# Patient Record
Sex: Male | Born: 1940
Health system: Southern US, Community
[De-identification: ages and names within clinical notes are randomized; demographics above are authoritative.]

## PROBLEM LIST (undated history)

## (undated) DIAGNOSIS — J189 Pneumonia, unspecified organism: Secondary | ICD-10-CM

## (undated) DIAGNOSIS — M5126 Other intervertebral disc displacement, lumbar region: Secondary | ICD-10-CM

## (undated) DIAGNOSIS — I739 Peripheral vascular disease, unspecified: Secondary | ICD-10-CM

## (undated) DIAGNOSIS — H9190 Unspecified hearing loss, unspecified ear: Secondary | ICD-10-CM

## (undated) DIAGNOSIS — I2510 Atherosclerotic heart disease of native coronary artery without angina pectoris: Secondary | ICD-10-CM

## (undated) DIAGNOSIS — R002 Palpitations: Secondary | ICD-10-CM

## (undated) DIAGNOSIS — IMO0002 Reserved for concepts with insufficient information to code with codable children: Secondary | ICD-10-CM

## (undated) DIAGNOSIS — K8689 Other specified diseases of pancreas: Secondary | ICD-10-CM

## (undated) DIAGNOSIS — S32409A Unspecified fracture of unspecified acetabulum, initial encounter for closed fracture: Secondary | ICD-10-CM

## (undated) DIAGNOSIS — N200 Calculus of kidney: Secondary | ICD-10-CM

## (undated) DIAGNOSIS — K219 Gastro-esophageal reflux disease without esophagitis: Secondary | ICD-10-CM

## (undated) DIAGNOSIS — R0989 Other specified symptoms and signs involving the circulatory and respiratory systems: Secondary | ICD-10-CM

## (undated) DIAGNOSIS — E7800 Pure hypercholesterolemia, unspecified: Secondary | ICD-10-CM

## (undated) DIAGNOSIS — E119 Type 2 diabetes mellitus without complications: Secondary | ICD-10-CM

## (undated) DIAGNOSIS — C801 Malignant (primary) neoplasm, unspecified: Secondary | ICD-10-CM

## (undated) DIAGNOSIS — I10 Essential (primary) hypertension: Secondary | ICD-10-CM

## (undated) HISTORY — PX: CORONARY ARTERY BYPASS GRAFT: SHX141

## (undated) HISTORY — PX: APPENDECTOMY: SHX54

## (undated) HISTORY — DX: Type 2 diabetes mellitus without complications: E11.9

## (undated) HISTORY — PX: EYE SURGERY: SHX253

## (undated) HISTORY — DX: Essential (primary) hypertension: I10

## (undated) HISTORY — DX: Reserved for concepts with insufficient information to code with codable children: IMO0002

## (undated) HISTORY — PX: PARS PLANA VITRECTOMY: SHX2166

## (undated) HISTORY — DX: Other specified symptoms and signs involving the circulatory and respiratory systems: R09.89

## (undated) HISTORY — DX: Pure hypercholesterolemia, unspecified: E78.00

## (undated) HISTORY — PX: OTHER SURGICAL HISTORY: SHX169

## (undated) HISTORY — PX: KNEE SURGERY: SHX244

## (undated) HISTORY — DX: Palpitations: R00.2

## (undated) HISTORY — PX: CHOLECYSTECTOMY: SHX55

## (undated) HISTORY — PX: CYSTOURETHROSCOPY: SHX476

## (undated) HISTORY — PX: BACK SURGERY: SHX140

## (undated) HISTORY — PX: NEPHROLITHOTOMY: SUR881

---
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
## 2004-12-07 ENCOUNTER — Ambulatory Visit (HOSPITAL_COMMUNITY): Admission: RE | Admit: 2004-12-07 | Discharge: 2004-12-07 | Payer: Self-pay | Admitting: Neurosurgery

## 2004-12-12 ENCOUNTER — Ambulatory Visit (HOSPITAL_COMMUNITY): Admission: RE | Admit: 2004-12-12 | Discharge: 2004-12-12 | Payer: Self-pay | Admitting: Neurosurgery

## 2004-12-23 ENCOUNTER — Ambulatory Visit (HOSPITAL_COMMUNITY): Admission: RE | Admit: 2004-12-23 | Discharge: 2004-12-23 | Payer: Self-pay | Admitting: Neurosurgery

## 2005-02-02 ENCOUNTER — Ambulatory Visit (HOSPITAL_COMMUNITY): Admission: RE | Admit: 2005-02-02 | Discharge: 2005-02-03 | Payer: Self-pay | Admitting: Ophthalmology

## 2005-10-09 ENCOUNTER — Ambulatory Visit (HOSPITAL_COMMUNITY): Admission: RE | Admit: 2005-10-09 | Discharge: 2005-10-09 | Payer: Self-pay | Admitting: Urology

## 2005-10-11 ENCOUNTER — Ambulatory Visit (HOSPITAL_COMMUNITY): Admission: AD | Admit: 2005-10-11 | Discharge: 2005-10-11 | Payer: Self-pay | Admitting: Urology

## 2005-10-23 ENCOUNTER — Ambulatory Visit (HOSPITAL_COMMUNITY): Admission: RE | Admit: 2005-10-23 | Discharge: 2005-10-23 | Payer: Self-pay | Admitting: Urology

## 2005-11-08 ENCOUNTER — Observation Stay (HOSPITAL_COMMUNITY): Admission: EM | Admit: 2005-11-08 | Discharge: 2005-11-09 | Payer: Self-pay | Admitting: General Surgery

## 2005-11-08 ENCOUNTER — Encounter (INDEPENDENT_AMBULATORY_CARE_PROVIDER_SITE_OTHER): Payer: Self-pay | Admitting: Specialist

## 2005-11-08 ENCOUNTER — Encounter (INDEPENDENT_AMBULATORY_CARE_PROVIDER_SITE_OTHER): Payer: Self-pay | Admitting: *Deleted

## 2005-12-22 ENCOUNTER — Ambulatory Visit: Payer: Self-pay | Admitting: Cardiovascular Disease

## 2006-01-02 ENCOUNTER — Ambulatory Visit: Payer: Self-pay

## 2006-01-03 ENCOUNTER — Ambulatory Visit: Payer: Self-pay | Admitting: Internal Medicine

## 2006-01-15 ENCOUNTER — Ambulatory Visit: Payer: Self-pay | Admitting: Internal Medicine

## 2006-09-20 ENCOUNTER — Ambulatory Visit (HOSPITAL_COMMUNITY): Admission: RE | Admit: 2006-09-20 | Discharge: 2006-09-20 | Payer: Self-pay | Admitting: Neurosurgery

## 2007-01-30 ENCOUNTER — Ambulatory Visit: Payer: Self-pay | Admitting: Cardiovascular Disease

## 2007-02-21 ENCOUNTER — Ambulatory Visit: Payer: Self-pay

## 2007-05-09 ENCOUNTER — Ambulatory Visit: Payer: Self-pay | Admitting: Cardiovascular Disease

## 2007-05-13 ENCOUNTER — Inpatient Hospital Stay (HOSPITAL_COMMUNITY): Admission: AD | Admit: 2007-05-13 | Discharge: 2007-05-19 | Payer: Self-pay | Admitting: Cardiovascular Disease

## 2007-05-13 ENCOUNTER — Encounter: Payer: Self-pay | Admitting: Thoracic Surgery (Cardiothoracic Vascular Surgery)

## 2007-05-13 ENCOUNTER — Ambulatory Visit: Payer: Self-pay | Admitting: Cardiovascular Disease

## 2007-05-13 ENCOUNTER — Ambulatory Visit: Payer: Self-pay | Admitting: Thoracic Surgery (Cardiothoracic Vascular Surgery)

## 2007-05-13 ENCOUNTER — Encounter: Payer: Self-pay | Admitting: Cardiovascular Disease

## 2007-05-13 ENCOUNTER — Inpatient Hospital Stay (HOSPITAL_BASED_OUTPATIENT_CLINIC_OR_DEPARTMENT_OTHER): Admission: RE | Admit: 2007-05-13 | Discharge: 2007-05-13 | Payer: Self-pay | Admitting: Cardiovascular Disease

## 2007-05-29 ENCOUNTER — Encounter
Admission: RE | Admit: 2007-05-29 | Discharge: 2007-05-29 | Payer: Self-pay | Admitting: Thoracic Surgery (Cardiothoracic Vascular Surgery)

## 2007-06-03 ENCOUNTER — Ambulatory Visit: Payer: Self-pay | Admitting: Thoracic Surgery (Cardiothoracic Vascular Surgery)

## 2007-06-10 ENCOUNTER — Ambulatory Visit: Payer: Self-pay | Admitting: Cardiovascular Disease

## 2007-07-18 ENCOUNTER — Ambulatory Visit: Payer: Self-pay

## 2007-07-24 ENCOUNTER — Ambulatory Visit (HOSPITAL_COMMUNITY): Admission: RE | Admit: 2007-07-24 | Discharge: 2007-07-24 | Payer: Self-pay | Admitting: Cardiovascular Disease

## 2007-07-24 ENCOUNTER — Ambulatory Visit: Payer: Self-pay | Admitting: Cardiovascular Disease

## 2007-07-24 LAB — CONVERTED CEMR LAB
BUN: 17 mg/dL (ref 6–23)
Basophils Absolute: 0 10*3/uL (ref 0.0–0.1)
Basophils Relative: 0 % (ref 0.0–1.0)
CO2: 30 meq/L (ref 19–32)
Calcium: 9.7 mg/dL (ref 8.4–10.5)
Chloride: 105 meq/L (ref 96–112)
Creatinine, Ser: 0.9 mg/dL (ref 0.4–1.5)
Eosinophils Absolute: 0.3 10*3/uL (ref 0.0–0.7)
Eosinophils Relative: 4.7 % (ref 0.0–5.0)
GFR calc Af Amer: 109 mL/min
GFR calc non Af Amer: 90 mL/min
Glucose, Bld: 185 mg/dL — ABNORMAL HIGH (ref 70–99)
HCT: 40.8 % (ref 39.0–52.0)
Hemoglobin: 14.2 g/dL (ref 13.0–17.0)
Lymphocytes Relative: 22.4 % (ref 12.0–46.0)
MCHC: 34.9 g/dL (ref 30.0–36.0)
MCV: 91.5 fL (ref 78.0–100.0)
Monocytes Absolute: 0.4 10*3/uL (ref 0.1–1.0)
Monocytes Relative: 6.3 % (ref 3.0–12.0)
Neutro Abs: 4.2 10*3/uL (ref 1.4–7.7)
Neutrophils Relative %: 66.6 % (ref 43.0–77.0)
Platelets: 189 10*3/uL (ref 150–400)
Potassium: 4.5 meq/L (ref 3.5–5.1)
RBC: 4.46 M/uL (ref 4.22–5.81)
RDW: 12.5 % (ref 11.5–14.6)
Sodium: 141 meq/L (ref 135–145)
WBC: 6.3 10*3/uL (ref 4.5–10.5)

## 2007-09-09 ENCOUNTER — Ambulatory Visit: Payer: Self-pay | Admitting: Cardiovascular Disease

## 2007-11-22 ENCOUNTER — Encounter (INDEPENDENT_AMBULATORY_CARE_PROVIDER_SITE_OTHER): Payer: Self-pay | Admitting: *Deleted

## 2007-11-22 ENCOUNTER — Ambulatory Visit (HOSPITAL_COMMUNITY): Admission: RE | Admit: 2007-11-22 | Discharge: 2007-11-23 | Payer: Self-pay | Admitting: Urology

## 2007-11-25 ENCOUNTER — Emergency Department (HOSPITAL_COMMUNITY): Admission: EM | Admit: 2007-11-25 | Discharge: 2007-11-25 | Payer: Self-pay | Admitting: Emergency Medicine

## 2007-12-03 ENCOUNTER — Emergency Department (HOSPITAL_COMMUNITY): Admission: EM | Admit: 2007-12-03 | Discharge: 2007-12-03 | Payer: Self-pay | Admitting: Emergency Medicine

## 2007-12-26 ENCOUNTER — Ambulatory Visit: Payer: Self-pay | Admitting: Cardiovascular Disease

## 2007-12-31 ENCOUNTER — Encounter: Admission: RE | Admit: 2007-12-31 | Discharge: 2007-12-31 | Payer: Self-pay | Admitting: Specialist

## 2008-02-20 ENCOUNTER — Inpatient Hospital Stay (HOSPITAL_COMMUNITY): Admission: RE | Admit: 2008-02-20 | Discharge: 2008-02-23 | Payer: Self-pay | Admitting: Specialist

## 2008-05-14 HISTORY — PX: OTHER SURGICAL HISTORY: SHX169

## 2008-06-10 ENCOUNTER — Telehealth: Payer: Self-pay | Admitting: Cardiovascular Disease

## 2008-06-16 ENCOUNTER — Ambulatory Visit (HOSPITAL_COMMUNITY): Admission: RE | Admit: 2008-06-16 | Discharge: 2008-06-17 | Payer: Self-pay | Admitting: Specialist

## 2008-10-22 ENCOUNTER — Encounter: Payer: Self-pay | Admitting: Cardiovascular Disease

## 2008-10-22 ENCOUNTER — Ambulatory Visit: Payer: Self-pay | Admitting: Cardiovascular Disease

## 2008-10-23 ENCOUNTER — Encounter: Admission: RE | Admit: 2008-10-23 | Discharge: 2008-10-23 | Payer: Self-pay | Admitting: Neurosurgery

## 2008-12-01 ENCOUNTER — Ambulatory Visit (HOSPITAL_COMMUNITY): Admission: RE | Admit: 2008-12-01 | Discharge: 2008-12-01 | Payer: Self-pay | Admitting: Neurosurgery

## 2008-12-15 ENCOUNTER — Telehealth: Payer: Self-pay | Admitting: Cardiovascular Disease

## 2008-12-16 DIAGNOSIS — E119 Type 2 diabetes mellitus without complications: Secondary | ICD-10-CM | POA: Insufficient documentation

## 2008-12-16 DIAGNOSIS — E1159 Type 2 diabetes mellitus with other circulatory complications: Secondary | ICD-10-CM | POA: Insufficient documentation

## 2008-12-16 DIAGNOSIS — M549 Dorsalgia, unspecified: Secondary | ICD-10-CM | POA: Insufficient documentation

## 2008-12-16 DIAGNOSIS — I10 Essential (primary) hypertension: Secondary | ICD-10-CM | POA: Insufficient documentation

## 2008-12-16 DIAGNOSIS — R002 Palpitations: Secondary | ICD-10-CM | POA: Insufficient documentation

## 2008-12-17 ENCOUNTER — Ambulatory Visit: Payer: Self-pay | Admitting: Cardiovascular Disease

## 2008-12-17 LAB — CONVERTED CEMR LAB
BUN: 18 mg/dL (ref 6–23)
Basophils Absolute: 0.1 10*3/uL (ref 0.0–0.1)
Basophils Relative: 0.9 % (ref 0.0–3.0)
CO2: 30 meq/L (ref 19–32)
Calcium: 9.6 mg/dL (ref 8.4–10.5)
Chloride: 101 meq/L (ref 96–112)
Creatinine, Ser: 0.9 mg/dL (ref 0.4–1.5)
Eosinophils Absolute: 0.3 10*3/uL (ref 0.0–0.7)
Eosinophils Relative: 3.7 % (ref 0.0–5.0)
GFR calc non Af Amer: 89.4 mL/min (ref >60)
Glucose, Bld: 173 mg/dL — ABNORMAL HIGH (ref 70–99)
HCT: 42.4 % (ref 39.0–52.0)
Hemoglobin: 14.2 g/dL (ref 13.0–17.0)
INR: 1 (ref 0.8–1.0)
Lymphocytes Relative: 22 % (ref 12.0–46.0)
Lymphs Abs: 1.8 10*3/uL (ref 0.7–4.0)
MCHC: 33.6 g/dL (ref 30.0–36.0)
MCV: 95.4 fL (ref 78.0–100.0)
Monocytes Absolute: 0.8 10*3/uL (ref 0.1–1.0)
Monocytes Relative: 9.4 % (ref 3.0–12.0)
Neutro Abs: 5.1 10*3/uL (ref 1.4–7.7)
Neutrophils Relative %: 64 % (ref 43.0–77.0)
Platelets: 240 10*3/uL (ref 150.0–400.0)
Potassium: 4.3 meq/L (ref 3.5–5.1)
Prothrombin Time: 10.6 s (ref 9.1–11.7)
RBC: 4.44 M/uL (ref 4.22–5.81)
RDW: 12 % (ref 11.5–14.6)
Sodium: 140 meq/L (ref 135–145)
WBC: 8.1 10*3/uL (ref 4.5–10.5)
aPTT: 27.6 s (ref 21.7–28.8)

## 2008-12-24 ENCOUNTER — Ambulatory Visit: Payer: Self-pay | Admitting: Cardiovascular Disease

## 2008-12-24 ENCOUNTER — Inpatient Hospital Stay (HOSPITAL_BASED_OUTPATIENT_CLINIC_OR_DEPARTMENT_OTHER): Admission: RE | Admit: 2008-12-24 | Discharge: 2008-12-24 | Payer: Self-pay | Admitting: Cardiovascular Disease

## 2009-01-22 ENCOUNTER — Encounter: Admission: RE | Admit: 2009-01-22 | Discharge: 2009-01-22 | Payer: Self-pay | Admitting: Neurosurgery

## 2009-01-28 ENCOUNTER — Encounter: Payer: Self-pay | Admitting: Cardiovascular Disease

## 2009-02-26 ENCOUNTER — Ambulatory Visit (HOSPITAL_COMMUNITY): Admission: RE | Admit: 2009-02-26 | Discharge: 2009-02-26 | Payer: Self-pay | Admitting: Neurosurgery

## 2009-05-10 ENCOUNTER — Telehealth: Payer: Self-pay | Admitting: Internal Medicine

## 2009-05-10 DIAGNOSIS — K5900 Constipation, unspecified: Secondary | ICD-10-CM | POA: Insufficient documentation

## 2009-05-11 ENCOUNTER — Ambulatory Visit: Payer: Self-pay | Admitting: Internal Medicine

## 2009-05-18 ENCOUNTER — Telehealth: Payer: Self-pay | Admitting: Internal Medicine

## 2009-05-26 ENCOUNTER — Telehealth: Payer: Self-pay | Admitting: Internal Medicine

## 2009-05-27 ENCOUNTER — Ambulatory Visit: Payer: Self-pay | Admitting: Internal Medicine

## 2009-05-27 DIAGNOSIS — E785 Hyperlipidemia, unspecified: Secondary | ICD-10-CM | POA: Insufficient documentation

## 2009-05-27 DIAGNOSIS — R1084 Generalized abdominal pain: Secondary | ICD-10-CM | POA: Insufficient documentation

## 2009-05-28 ENCOUNTER — Telehealth: Payer: Self-pay | Admitting: Physician Assistant

## 2009-05-28 ENCOUNTER — Encounter: Payer: Self-pay | Admitting: Physician Assistant

## 2009-05-28 ENCOUNTER — Encounter: Payer: Self-pay | Admitting: Internal Medicine

## 2009-05-28 DIAGNOSIS — R110 Nausea: Secondary | ICD-10-CM | POA: Insufficient documentation

## 2009-05-28 DIAGNOSIS — R198 Other specified symptoms and signs involving the digestive system and abdomen: Secondary | ICD-10-CM | POA: Insufficient documentation

## 2009-06-01 LAB — CONVERTED CEMR LAB
ALT: 15 units/L (ref 0–53)
AST: 19 units/L (ref 0–37)
Albumin: 4.3 g/dL (ref 3.5–5.2)
Alkaline Phosphatase: 59 units/L (ref 39–117)
BUN: 19 mg/dL (ref 6–23)
Basophils Absolute: 0 10*3/uL (ref 0.0–0.1)
Basophils Relative: 0.5 % (ref 0.0–3.0)
Bilirubin Urine: NEGATIVE
CO2: 30 meq/L (ref 19–32)
Calcium: 9.8 mg/dL (ref 8.4–10.5)
Chloride: 100 meq/L (ref 96–112)
Creatinine, Ser: 1 mg/dL (ref 0.4–1.5)
Eosinophils Absolute: 0.1 10*3/uL (ref 0.0–0.7)
Eosinophils Relative: 1.9 % (ref 0.0–5.0)
GFR calc non Af Amer: 79.06 mL/min (ref >60)
Glucose, Bld: 169 mg/dL — ABNORMAL HIGH (ref 70–99)
HCT: 41.7 % (ref 39.0–52.0)
Hemoglobin, Urine: NEGATIVE
Hemoglobin: 14.5 g/dL (ref 13.0–17.0)
Ketones, ur: NEGATIVE mg/dL
Leukocytes, UA: NEGATIVE
Lipase: 33 units/L (ref 11.0–59.0)
Lymphocytes Relative: 24 % (ref 12.0–46.0)
Lymphs Abs: 1.8 10*3/uL (ref 0.7–4.0)
MCHC: 34.7 g/dL (ref 30.0–36.0)
MCV: 93 fL (ref 78.0–100.0)
Monocytes Absolute: 0.7 10*3/uL (ref 0.1–1.0)
Monocytes Relative: 9.9 % (ref 3.0–12.0)
Neutro Abs: 4.7 10*3/uL (ref 1.4–7.7)
Neutrophils Relative %: 63.7 % (ref 43.0–77.0)
Nitrite: NEGATIVE
Platelets: 221 10*3/uL (ref 150.0–400.0)
Potassium: 4.4 meq/L (ref 3.5–5.1)
RBC: 4.49 M/uL (ref 4.22–5.81)
RDW: 13 % (ref 11.5–14.6)
Sodium: 140 meq/L (ref 135–145)
Specific Gravity, Urine: 1.015 (ref 1.000–1.030)
Total Bilirubin: 0.5 mg/dL (ref 0.3–1.2)
Total Protein, Urine: NEGATIVE mg/dL
Total Protein: 7.8 g/dL (ref 6.0–8.3)
Urine Glucose: NEGATIVE mg/dL
Urobilinogen, UA: 0.2 (ref 0.0–1.0)
WBC: 7.4 10*3/uL (ref 4.5–10.5)
pH: 6 (ref 5.0–8.0)

## 2009-06-02 ENCOUNTER — Ambulatory Visit (HOSPITAL_COMMUNITY): Admission: RE | Admit: 2009-06-02 | Discharge: 2009-06-02 | Payer: Self-pay | Admitting: Internal Medicine

## 2009-06-02 DIAGNOSIS — C7A8 Other malignant neuroendocrine tumors: Secondary | ICD-10-CM | POA: Insufficient documentation

## 2009-06-07 ENCOUNTER — Telehealth: Payer: Self-pay | Admitting: Internal Medicine

## 2009-06-09 ENCOUNTER — Encounter (INDEPENDENT_AMBULATORY_CARE_PROVIDER_SITE_OTHER): Payer: Self-pay | Admitting: *Deleted

## 2009-06-09 ENCOUNTER — Ambulatory Visit: Payer: Self-pay | Admitting: Internal Medicine

## 2009-06-09 ENCOUNTER — Telehealth (INDEPENDENT_AMBULATORY_CARE_PROVIDER_SITE_OTHER): Payer: Self-pay | Admitting: *Deleted

## 2009-06-09 ENCOUNTER — Ambulatory Visit (HOSPITAL_COMMUNITY): Admission: RE | Admit: 2009-06-09 | Discharge: 2009-06-09 | Payer: Self-pay | Admitting: Internal Medicine

## 2009-06-09 DIAGNOSIS — R933 Abnormal findings on diagnostic imaging of other parts of digestive tract: Secondary | ICD-10-CM | POA: Insufficient documentation

## 2009-06-09 LAB — CONVERTED CEMR LAB
Amylase: 113 units/L (ref 27–131)
Lipase: 39 units/L (ref 11.0–59.0)

## 2009-06-10 ENCOUNTER — Encounter (INDEPENDENT_AMBULATORY_CARE_PROVIDER_SITE_OTHER): Payer: Self-pay | Admitting: *Deleted

## 2009-06-10 ENCOUNTER — Telehealth: Payer: Self-pay | Admitting: Internal Medicine

## 2009-06-11 ENCOUNTER — Ambulatory Visit: Payer: Self-pay | Admitting: Internal Medicine

## 2009-06-11 ENCOUNTER — Encounter: Payer: Self-pay | Admitting: Internal Medicine

## 2009-06-11 ENCOUNTER — Encounter (INDEPENDENT_AMBULATORY_CARE_PROVIDER_SITE_OTHER): Payer: Self-pay | Admitting: *Deleted

## 2009-06-14 ENCOUNTER — Ambulatory Visit: Payer: Self-pay | Admitting: Internal Medicine

## 2009-06-15 ENCOUNTER — Encounter: Payer: Self-pay | Admitting: Internal Medicine

## 2009-06-15 ENCOUNTER — Telehealth: Payer: Self-pay | Admitting: Internal Medicine

## 2009-06-15 ENCOUNTER — Ambulatory Visit: Payer: Self-pay | Admitting: Internal Medicine

## 2009-06-15 LAB — CONVERTED CEMR LAB
Basophils Absolute: 0 10*3/uL (ref 0.0–0.1)
Basophils Relative: 0.5 % (ref 0.0–3.0)
Eosinophils Absolute: 0.1 10*3/uL (ref 0.0–0.7)
Eosinophils Relative: 1.7 % (ref 0.0–5.0)
HCT: 42 % (ref 39.0–52.0)
Hemoglobin: 14.9 g/dL (ref 13.0–17.0)
Lymphocytes Relative: 22.8 % (ref 12.0–46.0)
Lymphs Abs: 1.8 10*3/uL (ref 0.7–4.0)
MCHC: 35.4 g/dL (ref 30.0–36.0)
MCV: 92.1 fL (ref 78.0–100.0)
Monocytes Absolute: 0.6 10*3/uL (ref 0.1–1.0)
Monocytes Relative: 8 % (ref 3.0–12.0)
Neutro Abs: 5.2 10*3/uL (ref 1.4–7.7)
Neutrophils Relative %: 67 % (ref 43.0–77.0)
Platelets: 217 10*3/uL (ref 150.0–400.0)
RBC: 4.56 M/uL (ref 4.22–5.81)
RDW: 12.8 % (ref 11.5–14.6)
WBC: 7.8 10*3/uL (ref 4.5–10.5)

## 2009-06-16 ENCOUNTER — Telehealth: Payer: Self-pay | Admitting: Internal Medicine

## 2009-06-17 ENCOUNTER — Telehealth: Payer: Self-pay | Admitting: Internal Medicine

## 2009-06-18 ENCOUNTER — Ambulatory Visit: Payer: Self-pay | Admitting: Gastroenterology

## 2009-06-18 ENCOUNTER — Emergency Department (HOSPITAL_COMMUNITY): Admission: EM | Admit: 2009-06-18 | Discharge: 2009-06-18 | Payer: Self-pay | Admitting: Emergency Medicine

## 2009-06-18 ENCOUNTER — Telehealth: Payer: Self-pay | Admitting: Gastroenterology

## 2009-06-18 LAB — CONVERTED CEMR LAB: 5-HIAA, 24 Hr Urine: 11.4 mg/(24.h) — ABNORMAL HIGH (ref <=6.0)

## 2009-06-21 ENCOUNTER — Encounter: Payer: Self-pay | Admitting: Gastroenterology

## 2009-06-21 ENCOUNTER — Ambulatory Visit (HOSPITAL_COMMUNITY): Admission: RE | Admit: 2009-06-21 | Discharge: 2009-06-21 | Payer: Self-pay | Admitting: Gastroenterology

## 2009-06-23 ENCOUNTER — Encounter: Payer: Self-pay | Admitting: Gastroenterology

## 2009-06-23 ENCOUNTER — Telehealth: Payer: Self-pay | Admitting: Gastroenterology

## 2009-06-24 ENCOUNTER — Telehealth (INDEPENDENT_AMBULATORY_CARE_PROVIDER_SITE_OTHER): Payer: Self-pay | Admitting: *Deleted

## 2009-06-24 ENCOUNTER — Telehealth: Payer: Self-pay | Admitting: Internal Medicine

## 2009-06-25 ENCOUNTER — Encounter: Payer: Self-pay | Admitting: Internal Medicine

## 2009-06-26 ENCOUNTER — Encounter: Payer: Self-pay | Admitting: Internal Medicine

## 2009-07-02 ENCOUNTER — Encounter: Payer: Self-pay | Admitting: Internal Medicine

## 2009-07-03 ENCOUNTER — Encounter: Payer: Self-pay | Admitting: Internal Medicine

## 2009-07-12 ENCOUNTER — Encounter: Payer: Self-pay | Admitting: Internal Medicine

## 2009-07-15 ENCOUNTER — Encounter: Payer: Self-pay | Admitting: Internal Medicine

## 2009-07-20 ENCOUNTER — Telehealth: Payer: Self-pay | Admitting: Internal Medicine

## 2009-07-20 ENCOUNTER — Encounter: Payer: Self-pay | Admitting: Internal Medicine

## 2009-07-21 ENCOUNTER — Encounter: Payer: Self-pay | Admitting: Internal Medicine

## 2009-07-26 ENCOUNTER — Telehealth: Payer: Self-pay | Admitting: Internal Medicine

## 2009-07-27 ENCOUNTER — Encounter: Payer: Self-pay | Admitting: Internal Medicine

## 2009-08-10 ENCOUNTER — Telehealth: Payer: Self-pay | Admitting: Internal Medicine

## 2009-08-19 ENCOUNTER — Telehealth (INDEPENDENT_AMBULATORY_CARE_PROVIDER_SITE_OTHER): Payer: Self-pay | Admitting: *Deleted

## 2010-03-06 ENCOUNTER — Encounter: Payer: Self-pay | Admitting: Cardiothoracic Surgery

## 2010-03-06 ENCOUNTER — Encounter: Payer: Self-pay | Admitting: Internal Medicine

## 2010-03-17 NOTE — Progress Notes (Signed)
Summary: f/u call  Phone Note Call from Patient Call back at Home Phone [phone]   Caller: Patient Call For: Mike Gip Reason for Call: Talk to Nurse Summary of Call: following up on scheduling testing for "stomach burning"... discussed at yesterday's appt per pt Initial call taken by: Vallarie Mare,  May 28, 2009 8:05 AM  Follow-up for Phone Call        Spoke to Ricardo Le and he is the same, no worse.  He said he did have a large BM this AM and it was diarrhea.  I advised him to not do the Miralax today.  I told him I will speak to Amy and we will call him back if she is going to order a CT or any other testing.   Follow-up by: Joselyn Glassman,  May 28, 2009 8:20 AM  Additional Follow-up for Phone Call Additional follow up Details #1::        LET'S GO AHEAD WITH ABDOMINAL /PELVIC CT WITH CONTRAST. COULD BE DONE ON MONDAY-THEN HE NEEDS FOLLOW UP WITH DR. Lynnell Catalan. Additional Follow-up by: Sammuel Cooper PA-c,  May 28, 2009 11:40 AM     Appended Document: f/u call Ricardo Le said this afternoon he is hurting some and the burning is the same as yesterday.  He is having some bladder burning and Amy suggested he call his PCP if it is bladder problems.  She did say we will fax a Vicodin RX for pain for him.  I urged him to go to the ER if his situation worsens.

## 2010-03-17 NOTE — Progress Notes (Signed)
Summary: TRIAGE  Phone Note Call from Patient Call back at Home Phone [phone]   Caller: spouse Ricardo Le Call For: Ricardo Le Reason for Call: Talk to Nurse Summary of Call: Wife called and wants to let Dr Juanda Chance and Dr Christella Hartigan know that her husband is in [facility]. and had the tumor removed and now he has a lot of fluid around the pancreas and they are unable to take it out. Wife wants to know if Dr Juanda Chance can continue care for her husband. Initial call taken by: Tawni Levy,  July 20, 2009 4:48 PM  Follow-up for Phone Call        DR.Alam Guterrez PLEASE ADVISE  Follow-up by: Laureen Ochs LPN,  July 21, 4130 4:57 PM  Additional Follow-up for Phone Call Additional follow up Details #1::        He ought to finish his treatment at [facility]., and let his doctors send the discharge summary before I would continue his follow up Additional Follow-up by: Hart Carwin MD,  July 20, 2009 8:06 PM    Additional Follow-up for Phone Call Additional follow up Details #2::    Above MD orders reviewed with patient's wife. Pt. instructed to call back as needed.  Follow-up by: Laureen Ochs LPN,  July 22, 4399 8:11 AM

## 2010-03-17 NOTE — Assessment & Plan Note (Signed)
Summary: CP (786.59)/MMR  Nurse Visit   Vital Signs:  Patient profile:   70 year old male Pulse rate:   83 / minute Pulse rhythm:   regular BP sitting:   141 / 78  (left arm)  Vitals Entered By: Duncan Dull, RN, BSN (October 22, 2008 1:10 PM)  Visit Type:  Walk in Primary Provider:  Dr. Shelah Lewandowsky  CC:  chest pain.  History of Present Illness: Pt walked in c/o CP described as tooth ache, no stabbing, no sharpness, no nausea, no vomiting, no radiation, appeared to have a few episodes of SOB  while in my presence although pt. denies SOB. No cough, no pain with deep breaths. Pain is relieved when he lies down and at night while sleeping. EKG appeared normal, reviewed by Dr. Clifton James (DOD). He has chronic upper back pain which he is scheduled for a mylogram tomorrow. Dr. Clifton James did not have time to see him and recommended he go to the ER for a more thorough work up. Pt refused and wants to wait until Dr. Eden Emms is back in the office tomorrow. He did express understanding and agree to go the ER if his pain got worse. He wife was with him and also expressed understanding.   Appended Document: CP (786.59)/MMR spoke with Dr Eden Emms if pt needs to be seen will need to be aded to DOD today if not Dr Eden Emms will be glad to see him next week.  Called and spoke with pt he is to have the Myelogram at 12:30 today.  He really feels like all the pain is coming from his back and just stopped by yesterday to make sure.  He is not in as much pain today as he was yesterday.  He will call back on Monday morning if his pain is no better and he needs to be seen.  Appended Document: CP (786.59)/MMR spoke with pt, he is feeling better today, he had his test on friday and has an appt to follow up that testing on friday next week. he has had no further pain since then. he will call if problems develop.

## 2010-03-17 NOTE — Miscellaneous (Signed)
Summary: Orders Update  Clinical Lists Changes  Orders: Added new Test order of T-2 View CXR (71020TC) - Signed 

## 2010-03-17 NOTE — Miscellaneous (Signed)
  Clinical Lists Changes  Medications: Changed medication from ACIPHEX 20 MG TBEC (RABEPRAZOLE SODIUM) Take 1 tab 30 min before breakfast until gone to ACIPHEX 20 MG TBEC (RABEPRAZOLE SODIUM) Take 1 tab 30 min before breakfast and dinner meals - Signed Rx of ACIPHEX 20 MG TBEC (RABEPRAZOLE SODIUM) Take 1 tab 30 min before breakfast and dinner meals;  #60 x 3;  Signed;  Entered by: Rachael Fee MD;  Authorized by: Rachael Fee MD;  Method used: Electronically to [facility] [address]*, [address] [place], [place]  [address], Ph: [phone], Fax: [phone]    Prescriptions: ACIPHEX 20 MG TBEC (RABEPRAZOLE SODIUM) Take 1 tab 30 min before breakfast and dinner meals  #60 x 3   Entered and Authorized by:   Rachael Fee MD   Signed by:   Rachael Fee MD on 06/23/2009   Method used:   Electronically to        [facility] [id number] (retail)       [address]       [place], [place]  [address]       Ph: [phone]       Fax: [phone]   RxID:   [id number]

## 2010-03-17 NOTE — Procedures (Signed)
Summary: Upper Endoscopy  Patient: Ricardo Le Note: All result statuses are Final unless otherwise noted.  Tests: (1) Upper Endoscopy (EGD)   EGD Upper Endoscopy       DONE     [facility]     [address]     [place], [place]  [address]           ENDOSCOPY PROCEDURE REPORT           PATIENT:  [name], [name]  MR#:  [id number]     BIRTHDATE:  [birth year], 68 yrs. old  GENDER:  male           ENDOSCOPIST:  Hedwig Morton. Juanda Chance, MD     Referred by:  Windle Guard, M.D.           PROCEDURE DATE:  06/14/2009     PROCEDURE:  EGD with biopsy, Maloney Dilation of Esophagus     ASA CLASS:  Class II     INDICATIONS:  burning periumbilical abd. pain, not responssive to     PPI, MRI shows 2.1x2.0 cm pancreatic tail mass. He is scheduled     for EUS/aspiration,Ca19-9 is normal, last colon 2007           MEDICATIONS:   Versed 5 mg, Fentanyl 50 mcg     TOPICAL ANESTHETIC:  Exactacain Spray           DESCRIPTION OF PROCEDURE:   After the risks benefits and     alternatives of the procedure were thoroughly explained, informed     consent was obtained.  The LB GIF-H180 [id number] endoscope was     introduced through the mouth and advanced to the second portion of     the duodenum, without limitations.  The instrument was slowly     withdrawn as the mucosa was fully examined.     <<PROCEDUREIMAGES>>           Mild gastritis was found. diffusely erythematous gastric mucosa     With standard forceps, a biopsy was obtained and sent to pathology     (see image2).  A stricture was found in the distal esophagus (see     image5, image6, and image1). mild nonobstructive fibrous ring at     the g-e junction     56F Maloney dilator passed without difficulty maloney dilator     Otherwise the examination was normal (see image3 and image4).     Retroflexed views revealed no abnormalities.    The scope was then     withdrawn from the patient and the procedure completed.     COMPLICATIONS:  None         ENDOSCOPIC IMPRESSION:     1) Mild gastritis     2) Stricture in the distal esophagus     3) Otherwise normal examination     s/p passage of 56F Maloney dilator     s/p gastric biopsies     RECOMMENDATIONS:     keep appointment for EUS     Aciphex 20 mg po qd           REPEAT EXAM:  In 0 year(s) for.           ______________________________     Hedwig Morton. Juanda Chance, MD           CC:           n.     eSIGNED:   Hedwig Morton. Flavio Lindroth at 06/14/2009 08:49 AM  Emannuel, Vise, [id number]  Note: An exclamation mark (!) indicates a result that was not dispersed into the flowsheet. Document Creation Date: 06/14/2009 8:50 AM _______________________________________________________________________  (1) Order result status: Final Collection or observation date-time: 06/14/2009 08:37 Requested date-time:  Receipt date-time:  Reported date-time:  Referring Physician:   Ordering Physician: Lina Sar [phone]) Specimen Source:  Source: Launa Grill Order Number: [id number] Lab site:

## 2010-03-17 NOTE — Letter (Signed)
Summary: EGD Instructions  [name]  [address] [place], [place] [address]   Phone: [phone]  Fax: [phone]       Ricardo Le    June 12, 1940    MRN: [id number]       Procedure Day /Date:07/01/09  THURS       Arrival Time: 7 am     Procedure Time:8 am     Location of Procedure:                     [facility] ( Outpatient Registration)    PREPARATION FOR ENDOSCOPY   On 07/01/09  THE DAY OF THE PROCEDURE:  1.   No solid foods, milk or milk products are allowed after midnight the night before your procedure.  2.   Do not drink anything colored red or purple.  Avoid juices with pulp.  No orange juice.  3.  You may drink clear liquids until 6 am, which is 2 hours before your procedure.                                                                                                CLEAR LIQUIDS INCLUDE: Water Jello Ice Popsicles Tea (sugar ok, no milk/cream) Powdered fruit flavored drinks Coffee (sugar ok, no milk/cream) Gatorade Juice: apple, white grape, white cranberry  Lemonade Clear bullion, consomm, broth Carbonated beverages (any kind) Strained chicken noodle soup Hard Candy   MEDICATION INSTRUCTIONS  Unless otherwise instructed, you should take regular prescription medications with a small sip of water as early as possible the morning of your procedure.  Diabetic patients - see separate instructions.             OTHER INSTRUCTIONS  You will need a responsible adult at least 68 years of age to accompany you and drive you home.   This person must remain in the waiting room during your procedure.  Wear loose fitting clothing that is easily removed.  Leave jewelry and other valuables at home.  However, you may wish to bring a book to read or an iPod/MP3 player to listen to music as you wait for your procedure to start.  Remove all body piercing jewelry and leave at home.  Total time from sign-in until discharge  is approximately 2-3 hours.  You should go home directly after your procedure and rest.  You can resume normal activities the day after your procedure.  The day of your procedure you should not:   Drive   Make legal decisions   Operate machinery   Drink alcohol   Return to work  You will receive specific instructions about eating, activities and medications before you leave.    The above instructions have been reviewed and explained to me by   Chales Abrahams CMA Duncan Dull)  June 09, 2009 2:26 PM      I fully understand and can verbalize these instructions over the phone mailed to [facility] 06/09/09

## 2010-03-17 NOTE — Assessment & Plan Note (Signed)
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 He underwent 4 separate back surgeries in 2010. His last surgery was in January 2011. He has bowel movements every 4-5 days if he does not take milk of magnesia or mineral oil. He tried MiraLax yesterday with good results. He deniesany rectal bleeding. His last colonoscopy in December 2007 was normal. He has a history of coronary artery disease. Patient is status post coronary artery bypass graft with normal left ventricular function, high blood pressure, hyperlipidemia and type 2 diabetes.   GI Review of Systems      Denies abdominal pain, acid reflux, belching, bloating, chest pain, dysphagia with liquids, dysphagia with solids, heartburn, loss of appetite, nausea, vomiting, vomiting blood, weight loss, and  weight gain.      Reports change in bowel habits and  constipation.     Denies anal fissure, black tarry stools, diarrhea, diverticulosis, fecal incontinence, heme positive stool, hemorrhoids, irritable bowel syndrome, jaundice, light color stool, liver problems, rectal bleeding, and  rectal pain.    Current Medications (verified): 1)  Simvastatin 40 Mg Tabs (Simvastatin) .... Take One Tablet At Bedtime 2)  Aspirin 81 Mg Tbec (Aspirin) .... Take One Tablet By Mouth Daily 3)  Metformin Hcl 500 Mg Tabs (Metformin Hcl) .... Tab By Mouth Once Daily 4)  Glipizide 10 Mg Tabs (Glipizide) .[name] Kitchen.. 1 Tab By Mouth Once Daily 5)  Amlodipine Besylate 5 Mg Tabs (Amlodipine Besylate) .... Take One Tablet By Mouth Daily 6)  Lisinopril-Hydrochlorothiazide 20-12.5 Mg Tabs (Lisinopril-Hydrochlorothiazide) .[name] Kitchen.. 1 Tab By Mouth Once Daily 7)  Milk of Magnesia  400 Mg/19ml Susp (Magnesium Hydroxide) .... As Needed 8)  Mineral Oil  Oil (Mineral Oil) .... As Needed 9)  Stool Softener 100 Mg Caps (Docusate Sodium) .... As Needed  Allergies (verified): 1)  ! * Ivp Dye  Past History:  Past Medical History: Reviewed history from 12/16/2008 and no changes required. Current Problems:  PALPITATIONS (ICD-785.1) CAD (ICD-414.00) HYPERCHOLESTEROLEMIA (ICD-272.0) HYPERTENSION (ICD-401.9) DM (ICD-250.00) BACK PAIN (ICD-724.5)  Previous history of carotid bruits  Past Surgical History: Reviewed history from 05/10/2009 and no changes required. Right L2-3 re-exploration of laminectomy with redo  microdiskectomy.  05/2008 cyst removed from spine  Percutaneous nephrolithotomy.    CABG x4  2009 (LIMA to LAD, SVG to first circumflex marginal  branch with sequential SVG to second circumflex marginal branch and  saphenous vein to posterior descending coronary artery with endoscopic  vein harvest right lower extremity by Dr. Cornelius Moras on May 14, 2007).  Cystourethroscopy, right retrograde pyelogram, manipulate stone in the renal pelvis, right Double-J catheter.  Pars plana vitrectomy left eye, retinal photocoagulation left eye, membrane peel left eye. Cholecystectomy Appendectomy Right Knee Arthroscopy  Family History: non-contributory Family History of Heart Disease: Father Family History of Diabetes: Brothers Type 2  No FH of Colon Cancer:  Social History: Reviewed history from 05/10/2009 and no changes required. Married Does not work due to back problems Sedentary Non-smoker Occupation: Retired Optician, dispensing Alcohol Use - no Illicit Drug Use - no Patient gets regular exercise.  Review of Systems       The patient complains of back pain.  The  patient denies allergy/sinus, anemia, anxiety-new, arthritis/joint pain, blood in urine, breast changes/lumps, change in vision, confusion, cough, coughing up blood, depression-new, fainting, fatigue, fever,  headaches-new, hearing problems, heart murmur, heart rhythm changes, itching, muscle pains/cramps, night sweats, nosebleeds, shortness of breath, skin rash, sleeping problems, sore throat, swelling of feet/legs, swollen lymph glands, thirst - excessive, urination - excessive, urination changes/pain, urine leakage, vision changes, and voice change.         Pertinent positive and negative review of systems were noted in the above HPI. All other ROS was otherwise negative.   Vital Signs:  Patient profile:   70 year old male Height:      68 inches Weight:      167 pounds BMI:     25.48 BSA:     1.89 Pulse rate:   88 / minute Pulse rhythm:   regular BP sitting:   126 / 68  (left arm) Cuff size:   regular  Vitals Entered By: Ok Anis CMA (May 11, 2009 10:35 AM)  Physical Exam  Eyes:  PERRLA, no icterus. Mouth:  No deformity or lesions, dentition normal. Neck:  Supple; no masses or thyromegaly. Chest Wall:  post thoracotomy scar. Lungs:  Clear throughout to auscultation. Heart:  Regular rate and rhythm; no murmurs, rubs,  or bruits. Abdomen:  Soft, nontender and nondistended. No masses, hepatosplenomegaly or hernias noted. Normal bowel sounds. Rectal:  increased rectal sphincter tone. Soft Hemoccult-negative stool. Extremities:  No clubbing, cyanosis, edema or deformities noted. Skin:  Intact without significant lesions or rashes. Psych:  Alert and cooperative. Normal mood and affect.   Impression & Recommendations:  Problem # 1:  CONSTIPATION (ICD-564.00) Patient has functional constipation due to his decreased level of activity associated with previous back surgeries. His constipation may be multifactorial  resulting from taking pain medications. We will start him on MiraLax 9 g twice a week. He needs to stay on a high-fiber diet.  He has started walking 1 mile every day.  Patient Instructions: 1)  Patient needs to follow a high-fiber diet. 2)  MiraLax 9-17 g 2-3 times a week  p.r.n. 3)  Recall colonoscopy in December 2017. 4)  Increase activity. 5)  Copy sent to : Dr Windle Guard Prescriptions: MIRALAX  POWD (POLYETHYLENE GLYCOL 3350) Take 1/2 capful powder dissolved in at least 8 ounces of water/juice two times per week.  #527 grams x 0   Entered by:   Hortense Ramal CMA (AAMA)   Authorized by:   Hart Carwin MD   Signed by:   Hortense Ramal CMA (AAMA) on 05/11/2009   Method used:   Electronically to        [facility] [id number] (retail)       [address] [place], [place]  [address]       Ph: [phone]       Fax: [phone]   RxID:   [id number]

## 2010-03-17 NOTE — Progress Notes (Signed)
Summary: EUS  Phone Note Call from Patient   Summary of Call: i spoke with patient and wife this AM (on call).  HE has had no BM in 4-5 days, is very uncomfortable.  They tried miralax today ( 2 doses) and a suppository without effect.  he's had no vomitting, no abd distension.  I recommended they try fleets enema.   Wife also also wants to try to speed up EUS that is planned for the 19th (she said it was booked a month ago but it looks like it was actually booked a week ago).  I looked at my schedule, can probably fit him in on Monday if I cancel some [facility] time.   Patty, can you put him in for upper eus on monday, to follow my lunch time EUS case scheduled already to start at 12:15.  Please block out my [facility] schedule until 2:30. Initial call taken by: Rachael Fee MD,  Jun 18, 2009 6:22 AM  Follow-up for Phone Call        called the pt's wife and she was given the new time and instructions for the EUS change.  She informed me that the pt was taken by ambulance this morning.  She will call with an update. Follow-up by: Chales Abrahams CMA Duncan Dull),  Jun 18, 2009 8:37 AM  Additional Follow-up for Phone Call Additional follow up Details #1::        ok, will await word from them about this Additional Follow-up by: Rachael Fee MD,  Jun 18, 2009 8:58 AM

## 2010-03-17 NOTE — Progress Notes (Signed)
Summary: chest pain  Phone Note Call from Patient Call back at Home Phone [phone]   Caller: Spouse Reason for Call: Talk to Nurse Summary of Call: Patient continues to experience chest pain; woke him up during the night last night.  Per wife, back surgery went very well with no residual issues; however the chest pain has continued on a daily basis.  Per Dennie Bible, send message, she will review and return call immediately.  Initial call taken by: Burnard Leigh,  December 15, 2008 1:00 PM  Follow-up for Phone Call        Spoke with wife who wanted me to speak with pt.  Pt reports he has been having chest pain for last few weeks and would like appt. with Dr. Eden Emms. Pt. is not having pain at this time but states it did wake him up during night. Describes as ache in chest with no radiation, nausea or SOB. Pain goes away on own.  Offered pt appt with PA tomorrow but he wants appt. with Dr. Eden Emms.  Will forward to Debra to add on to Dr. Fabio Bering schedule.  Pt aware Stanton Kidney is not back in office until 11/4 and he states he is OK to wait until then.  Pt. instructed if chest pain were to worsen he should go to ER. Follow-up by: Dossie Arbour, RN, BSN,  December 15, 2008 1:23 PM  Additional Follow-up for Phone Call Additional follow up Details #1::        spoke with pt, he will see dr Eden Emms 12-17-08 at 2:15pm Deliah Goody, RN  December 16, 2008 8:37 AM

## 2010-03-17 NOTE — Progress Notes (Signed)
Summary: refills  Phone Note Call from Patient Call back at Home Phone [phone]   Caller: Patient Call For: Ricardo Le Reason for Call: Refill Medication, Talk to Nurse Summary of Call: Patient needs refills for his Vicodin. Initial call taken by: Tawni Levy,  Jun 16, 2009 4:03 PM  Follow-up for Phone Call        Dr Ricardo Le ~ Amy gave patient #30 vicodin on 05/28/09 (to be taken every 6 hours as needed)...can he get more at this time? Follow-up by: Lamona Curl CMA Duncan Dull),  Jun 16, 2009 4:29 PM  Additional Follow-up for Phone Call Additional follow up Details #1::        yes. Additional Follow-up by: Hart Carwin MD,  Jun 17, 2009 1:09 PM    Additional Follow-up for Phone Call Additional follow up Details #2::    ok. New prescription faxed to pharmacy. Follow-up by: Lamona Curl CMA (AAMA),  Jun 17, 2009 2:07 PM  New/Updated Medications: VICODIN 5-500 MG TABS (HYDROCODONE-ACETAMINOPHEN) Take 1 tab every 6-8 hours as needed for pain. MUST LAST 1 MONTH Prescriptions: VICODIN 5-500 MG TABS (HYDROCODONE-ACETAMINOPHEN) Take 1 tab every 6-8 hours as needed for pain. MUST LAST 1 MONTH  #30 x 1   Entered by:   Lamona Curl CMA (AAMA)   Authorized by:   Hart Carwin MD   Signed by:   Lamona Curl CMA (AAMA) on 06/17/2009   Method used:   Printed then faxed to ...       [facility] [address] (retail)       [address]       [place], [place]  [address]       Ph: [phone]       Fax: [phone]   RxID:   [id number]

## 2010-03-17 NOTE — Progress Notes (Signed)
Summary: pain is worse  Phone Note Call from Patient Call back at Home Phone [phone]   Caller: Patient Call For: Juanda Chance Reason for Call: Talk to Nurse Summary of Call: Patient wants to know when is he to see a doctor because the pain on his side is getting worse. Had scan on Wed and would like results. Initial call taken by: Tawni Levy,  June 07, 2009 8:12 AM  Follow-up for Phone Call        PLEASE SCHEDULE HIM FOR MRI OF ABDOMEN,ATTENTION TO PANCREAS -REASON IS ABNORMAL CT OF PANCREAS,R/O MALIGNANCY. ALSO GET AMYLASE,LIPASE AND CA19-9. HE NEEDS FOLLOW UP APPT WITH DR. Juanda Chance VERY SOON AFTER MRI. THANKS-AMY Follow-up by: Sammuel Cooper PA-c,  June 07, 2009 10:48 AM     Appended Document: Orders Update Pt. is scheduled for his MRI at [facility] on 06-09-09 at 8am, arrive by 7:45am and NPO after 12mn. He will also have labs done that morning. He will f/u with Dr.Lilybeth Vien in the office on 06-15-09 at 9am. Pt. instructed to call back as needed.    Clinical Lists Changes  Orders: Added new Referral order of MRI Abdomen (MRI Abdomen) - Signed

## 2010-03-17 NOTE — Miscellaneous (Signed)
Summary: CT ABD  & Pelvis  Clinical Lists Changes  Problems: Added new problem of NAUSEA ALONE (ICD-787.02) Added new problem of CHANGE IN BOWELS (ZOX-096.04) Orders: Added new Referral order of CT Abdomen/Pelvis with Contrast (CT Abd/Pelvis w/con) - Signed  Appended Document: CT ABD  & Pelvis Spoke to [facility] at [facility].  They will call and fax [facility] in [facility] the premedication kit for the pt that is having his CT scan on 06-02-09 at 9Am at [facility].  The pt will come there, [facility] Mon to pick up his contrast.

## 2010-03-17 NOTE — Miscellaneous (Signed)
Summary: [facility] Previsit/prep  Clinical Lists Changes  Allergies: Changed allergy or adverse reaction from * IVP DYE to * IVP DYE Observations: Added new observation of ALLERGY REV: Done (06/11/2009 8:20)

## 2010-03-17 NOTE — Letter (Signed)
Summary: Patient [name] Biopsy Results   Gastroenterology  [address] [place], [place] [address]   Phone: [phone]  Fax: [phone]        Jun 15, 2009 MRN: [id number]    [facility] [address] [place], [place]  [address]    Dear Ricardo Le,  I am pleased to inform you that the biopsies taken during your recent endoscopic examination did not show any evidence of cancer upon pathologic examination.  Additional information/recommendations:  __No further action is needed at this time.  Please follow-up with      your primary care physician for your other healthcare needs.  __ Please call [phone] to schedule a return visit to review      your condition.  _x_ Continue with the treatment plan as outlined on the day of your      exam.  __ You should have a repeat endoscopic examination for this problem              in _ months/years.   Please call us if you are having persistent problems or have questions about your condition that have not been fully answered at this time.  Sincerely,  Hart Carwin MD  This letter has been electronically signed by your physician.  Appended Document: Patient Notice-Endo Biopsy Results letter mailed 5.5.11

## 2010-03-17 NOTE — Progress Notes (Signed)
  Phone Note Other Incoming   Request: Send information Summary of Call: Request for records received from [facility]. Request forwarded to [employer].

## 2010-03-17 NOTE — Progress Notes (Signed)
Summary: Labs/CXR Scheduled  Phone Note Outgoing Call   Call placed by: Greer Ee RN,  Jun 15, 2009 8:48 AM Call placed to: Patient Summary of Call: F/u phone call placed to pt. this a.m. pt. c/o pain level 10 . described as burning and pressure. DR. Juanda Chance made aware and stated that this is why we did procedure he has a mass on pancrease that he scheduled for a eus in 2 weeks,but because he had egd done we need to schedule him for stat cbc and chest x-ray here at the office. Stanton Kidney made aware of order and forwared. pt. notified of DR. Isabelly Kobler order. Initial call taken by: Greer Ee RN,  Jun 15, 2009 8:55 AM  Follow-up for Phone Call         The lab and Chest X-ray orders are in the computer. Pt. is aware to go to the lab and radiology dept. here at [facility]. Pt. instructed to call back as needed.  Follow-up by: Laureen Ochs LPN,  Jun 15, 1608 9:45 AM

## 2010-03-17 NOTE — Miscellaneous (Signed)
Summary: Vicodin RX  Clinical Lists Changes  Medications: Added new medication of VICODIN 5-500 MG TABS (HYDROCODONE-ACETAMINOPHEN) Take 1 tab every 6-8 hours as needed for pain - Signed Rx of VICODIN 5-500 MG TABS (HYDROCODONE-ACETAMINOPHEN) Take 1 tab every 6-8 hours as needed for pain;  #30 x 0;  Signed;  Entered by: Lowry Ram NCMA;  Authorized by: Sammuel Cooper PA-c;  Method used: Printed then faxed to [facility] [id number] [place], [place]  [address], Ph: [phone], Fax: [phone]    Prescriptions: VICODIN 5-500 MG TABS (HYDROCODONE-ACETAMINOPHEN) Take 1 tab every 6-8 hours as needed for pain  #30 x 0   Entered by:   Lowry Ram NCMA   Authorized by:   Sammuel Cooper PA-c   Signed by:   Lowry Ram NCMA on 05/28/2009   Method used:   Printed then faxed to ...       [facility] [address] (retail)       [address]       [place], [place]  [address]       Ph: [phone]       Fax: [phone]   RxID:   [id number]

## 2010-03-17 NOTE — Progress Notes (Signed)
Summary: TRIAGE-Surgical Appt./Colonoscopy Scheduled  Phone Note Call from Patient Call back at [employer] [phone]   Caller: Patient Call For: Dr Juanda Chance Reason for Call: Talk to Nurse Action Taken: Rx Called In Summary of Call: sick from cyst on pancreas... weak and unable to eat because of nausea and vomiting Initial call taken by: Vallarie Mare,  Jun 24, 2009 8:11 AM  Follow-up for Phone Call        DR.Jameison Haji--PLEASE CALL TO DISCUSS SURGICAL REFERRAL W/PT. AND HIS WIFE. Follow-up by: Laureen Ochs LPN,  Jun 24, 2009 8:55 AM  Additional Follow-up for Phone Call Additional follow up Details #1::        Patient called back and states miralax is not helping and his bowels have not moved. Additional Follow-up by: Zackery Barefoot,  Jun 24, 2009 12:12 PM    Additional Follow-up for Phone Call Additional follow up Details #2::    I have spokjen to Mr and Mrs Laury Axon on 2 subjects: constipation- continue Miralax and Mad citrate. Last cloln 2007. Please schedule for colonoscopy . 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 Follow-up by: Hart Carwin MD,  Jun 24, 2009 1:00 PM  Additional Follow-up for Phone Call Additional follow up Details #3:: Details for Additional Follow-up Action Taken: Records faxed to [name]. Rollene Rotunda confirmed she has the records, she will callback with the appt. information. Laureen Ochs LPN  Jun 24, 2009 2:16 PM  Pt. scheduled his previsit for 06-25-09 at 11am and his Colonoscopy in [facility] on 07-02-09 at 11am. Appt. w/[name] 07-06-09 at 9am, they will mail appt. information to pt.  Pt. instructed to call back as needed.   Rebecka Apley in [facility]  Radiology Dept. will FedX the CD's of all pt. x-rays to [name], ATTN: Penny Minor. [facility] [place]  [address]. FedX #[id number], internal# [id number].)  Additional Follow-up by: Laureen Ochs LPN,  Jun 24, 2009 2:24 PM

## 2010-03-17 NOTE — Procedures (Signed)
Summary: EGD/[facility]  EGD/[facility]   Imported By: Sherian Rein 07/29/2009 14:21:59  _____________________________________________________________________  External Attachment:    Type:   Image     Comment:   External Document

## 2010-03-17 NOTE — Letter (Signed)
Summary: Diabetic Instructions  [facility]  [address] [place], [place] [address]   Phone: [phone]  Fax: [phone]    Ricardo Le 1940-07-11 MRN: [id number]   X   ORAL DIABETIC MEDICATION INSTRUCTIONS  The day before your procedure:   Take your diabetic pill as you do normally  The day of your procedure:   Do not take your diabetic pill    We will check your blood sugar levels during the admission process and again in Recovery before discharging you home  ________________________________________________________________________

## 2010-03-17 NOTE — Progress Notes (Signed)
Summary: TRIAGE-Epigastric Burning  Phone Note Call from Patient Call back at Home Phone [phone] Call back at [phone]   Caller: Spouse Ricardo Le Call For: Ricardo Le Reason for Call: Talk to Nurse Summary of Call: Wife would like lab results and wants to speak to Dr Ricardo Le regarding her husbands pain and pancreas. Initial call taken by: Tawni Levy,  June 10, 2009 9:59 AM  Follow-up for Phone Call        Pt. is scheduled for an EUS on 07-01-09 by Dr.Jacobs. CA 19-9 reviewed with Mrs.Boyde.  Pt. continues with a constant epigastric burning sensation, burning improves when he eats, so he eats every 4 hours around the clock. Also has increased belching. Has been taking Achipex daily since 05-27-09, no significant improvement.    DR.Lexani Corona PLEASE ADVISE... Also, do you want patient to keep his office visit appointment with you on Monday 06/14/09?  Follow-up by: Laureen Ochs LPN,  June 10, 2009 10:09 AM  Additional Follow-up for Phone Call Additional follow up Details #1::        Spoke with the pt., "severe burning, periumbilical area", relieve with certain food like cereal and eggs. Wakes him up at night. EUS not for another 3 weeks.  Please set up 24 hr urine collection for [facility] and also set up for EGD to r/o "ulcer".His cell phone # is  [phone], he will be going out in the afternoon. Additional Follow-up by: Hart Carwin MD,  June 10, 2009 1:32 PM    Additional Follow-up for Phone Call Additional follow up Details #2::    Above MD orders reviewed with patient's wife. He is scheduled for a previsit on 06-11-09 at 8:30am, he will go to the lab for 24 hour urine collection after previsit, and his Endo. is scheduled for 06-14-09 at 8:30am in [facility]. OV on 06-15-09 has been cancelled. Pt. instructed to call back as needed.  Follow-up by: Laureen Ochs LPN,  June 10, 2009 2:06 PM

## 2010-03-17 NOTE — Letter (Signed)
Summary: [facility]   Imported By: Lester Russia 08/31/2009 10:55:00  _____________________________________________________________________  External Attachment:    Type:   Image     Comment:   External Document

## 2010-03-17 NOTE — Progress Notes (Signed)
Summary: Triage-worsening pain  Phone Note Call from Patient Call back at Home Phone [phone]   Caller: Patient Call For: Juanda Chance Reason for Call: Talk to Nurse Summary of Call: Patient wants to be sooner than first availble for worsening abd pain. Initial call taken by: Tawni Levy,  May 26, 2009 2:08 PM  Follow-up for Phone Call        Abd. pain is getting worse, Bentyl not helping. Passed blood 2 days ago, stools are black, ? fever. Constipation has improved.   1) See Mike Gip PAC on 05-27-09 at 1:30pm 2) If symptoms become worse call back immediately or go to ER.  Follow-up by: Laureen Ochs LPN,  May 26, 2009 4:21 PM  Additional Follow-up for Phone Call Additional follow up Details #1::        OK Additional Follow-up by: Hart Carwin MD,  May 26, 2009 11:13 PM

## 2010-03-17 NOTE — Progress Notes (Signed)
Summary: Triage-Constipation  Phone Note Call from Patient Call back at Home Phone [phone] Call back at [phone]   Caller: Spouse Betty Call For: Ricardo Le Reason for Call: Talk to Nurse Summary of Call: Patient has severe constipation and can't wait until first available appt 5-16, wants to know what to do. Initial call taken by: Tawni Levy,  May 10, 2009 10:58 AM  Follow-up for Phone Call        Pt's last appt. was for a Colon on 01-15-2006. Per pt. wife--pt. w/constipation for 6 monthes, is getting worse. Takes mineral oil as needed. Has tried Miralax, "He has tried everything, nothing seems to really help." Pt. will see Dr.Jowana Thumma on 05-13-09 at 11:30am. Pt. instructed to call back as needed.  Follow-up by: Laureen Ochs LPN,  May 10, 2009 11:22 AM

## 2010-03-17 NOTE — Op Note (Signed)
Summary: Cholecystitis   [name], [name]              ACCOUNT NO.:  [id number]   MEDICAL RECORD NO.:  [id number]          PATIENT TYPE:  INP   LOCATION:  1319                         FACILITY:  [facility]   PHYSICIAN:  Anselm Pancoast. Weatherly, M.D.DATE OF BIRTH:  [birth year]   DATE OF PROCEDURE:  11/08/2005  DATE OF DISCHARGE:  11/09/2005                                 OPERATIVE REPORT   PREOPERATIVE DIAGNOSIS:  Acute cholecystitis with stones.   POSTOPERATIVE DIAGNOSIS:  Acute cholecystitis with stones.   OPERATION:  Laparoscopic cholecystectomy with cholangiogram.   SURGEON:  Anselm Pancoast. Zachery Dakins, M.D.   ASSISTANT:  Currie Paris, M.D.   ANESTHESIA:  General anesthesia.   HISTORY:  Ricardo Le is a 70 year old who presented in the office today  and was seen by Dr. Luisa Hart with the following history:  He said he started  having pain approximately a week ago. He was seen in [facility] and referred for ultrasound at [facility]. He was not recalled  any reports, continued to have pain in the upper abdomen. He had had a  recent kidney stone. A stent was removed recently by Dr. Patsi Sears. Then,  he after checking was found to have acute cholecystitis and was told to call  to be seen in our office. He called, and they did not think it was an acute  problem, and appointment was given today to see Dr. Luisa Hart. He presented to  the office today and said he was having basically a continuous pain in the  right upper quadrant, did not have chills or fever, and Dr. Luisa Hart found  that he was definitely tender and referred him over for an emergent  cholecystectomy this evening, and I was the doctor on call. I saw the  patient the first time about 6-7:00, and he was vaguely tender in the right  upper quadrant, not really acute. White count and CMET were normal. EKG and  chest x-ray was performed, and I recommend we go ahead and proceed with  laparoscopic  cholecystectomy this evening. The patient was in agreement. He  was taken to the operative suite. He had been given __________ PAS  stockings. Induction of general anesthesia, endotracheal tube, oral tube to  the stomach. The abdomen was clipped and then prepped with Betadine solution  and draped in a sterile manner. A small incision was made below the  umbilicus. The fascia was identified, picked up between two Kochers, and  then a small opening carefully made in the underlying peritoneum, making an  opening through a [employer].  A pursestring suture of 0 Vicryl was placed, a  Hasson cannula introduced, and the right upper quadrant was inspected. The  omentum was kind of laying over the liver, and the upper 10-mm trocar was  placed under direct vision in the subxiphoid after anesthetizing the fascia,  and the two lateral 5-mm probes were placed by Dr. Jamey Ripa at the appropriate  right lateral position under direct vision after anesthetizing the fascia.  The omentum was slipped off of the gallbladder, and the gallbladder was  distended. There were some  adhesions around it, but it was not that of an  __________ acute cholecystitis. The gallbladder was retracted upward. The  adhesions were carefully taken down. There were numerous adhesions, and we  worked on them very carefully to the proximal portion of the gallbladder.  The cystic artery and cystic duct were then exposed under direct vision. Two  clips were placed on the artery and a clip on the cystic duct gallbladder  junction and then a small opening made just proximally, and Koch catheter  was introduced. There was bile that kind of squirted out as if it was under  direct pressure. The x-ray, however, showed a very small, normal-appearing,  common bile duct, a fairly long cystic duct and good flow into the duodenum.  The right and left intrahepatic radicles were visualized. The catheter was  removed. The cystic duct was triply clipped just  proximal to this. There was  a little vessel on the actual cystic duct that was accomplished with the  most proximal clip, and then the cystic artery had already been divided, and  the gallbladder was freed from its bed with hook electrocautery. I used a  spatula at the end to kind of cauterize any questionable areas of bleeding.  The gallbladder had been placed into an EndoCatch bag. We then switched the  cameras to the upper 10-mm port, grabbed the bag, brought it out through the  small incision, and the whole intact gallbladder was placed in the pathology  specimen. The nurse that was at the back table said she could not definitely  tell whether there were stones or not. I did not open it up since it was  already in the bag labeled when I asked at completion of surgery. The 5-mm  ports were withdrawn under direct vision. I placed an additional figure-of-  eight suture of 0 Vicryl in the fascia at the umbilicus and anesthetized the  fascia at the umbilicus with Marcaine with adrenaline. The carbon dioxide  was released, and we put a figure-of-eight in the fascia in the subxiphoid  area, and the subcutaneous wounds were closed with 4-0 Vicryl, benzoin and  Steri-Strips on the skin. The patient tolerated the procedure nicely and was  extubated and sent to the recovery room in stable postoperative condition.  Hopefully, the patient will be ready to be discharged in the morning, and  hopefully, this discomfort will be resolved. I will allow him to start  liquids this evening and advance him towards a select diet in the morning  and will let him resume his chronic medications.           ______________________________  Anselm Pancoast. Zachery Dakins, M.D.     WJW/MEDQ  D:  11/08/2005  T:  11/10/2005  Job:  [id number]

## 2010-03-17 NOTE — Progress Notes (Signed)
Summary: TRIAGE  Phone Note Call from Patient Call back at Home Phone [phone] Call back at cell [phone]   Caller: [name] spouse Call For: Dr Juanda Chance Dr Christella Hartigan Summary of Call: Wants to switch from Dr Juanda Chance because he is not totally satisfied with her. Feels that since Dr Christella Hartigan did the biopsy he would be a better choice. Initial call taken by: Leanor Kail [facility],  August 10, 2009 12:10 PM  Follow-up for Phone Call        DR.Jaynell Castagnola--Do you approve switch? Follow-up by: Laureen Ochs LPN,  August 10, 2009 12:13 PM  Additional Follow-up for Phone Call Additional follow up Details #1::        Yes, I agree that Dr. Christella Hartigan is more knowledgable  to take care of Mr. Kinyon problem. Additional Follow-up by: Hart Carwin MD,  August 10, 2009 3:12 PM    Additional Follow-up for Phone Call Additional follow up Details #2::    DR.JACOBS--Will you accept this patient? Laureen Ochs LPN  August 10, 2009 3:16 PM   Additional Follow-up for Phone Call Additional follow up Details #3:: Details for Additional Follow-up Action Taken: no thank you.  Additional Follow-up by: Rachael Fee MD,  August 10, 2009 4:13 PM  Pt. advised of above MD orders. Pt. states he was told by [name] to call Dr.Joylene Wescott about his c/o epigastric pain/burning, he has had this for several monthes, "It goes up into my esophagus and it goes through my intestines and burns my rectum when I have a BM" Pt. states he takes Omperazole two times a day, Carafate Slurry two times a day and Bentyl as needed.  DR.Shyquan Stallbaumer PLEASE ADVISE Laureen Ochs LPN  August 10, 2009 4:23 PM  He expressed dissatisfaction with my care. I prefer not to continue in our doctor-patient relationship. He needs to find another doctor.DB  Above MD orders reviewed with patient. pt. now states he is not dissasitified with Dr.Aunika Kirsten's care, he just thought Dr.Jacobs was a pancrease specialist, he wants to continue with Dr.Armanie Martine. "I have got to see  somebody, I have lost 15 pounds and I am hurting."  DR.Jerre Diguglielmo PLEASE ADVISE Laureen Ochs LPN  August 12, 2009 8:38 AM  He expressed dissatisfaction with my care. I prefer not to continue in our doctor-patient relationship. DB  Above MD orders reviewed with patient. "You tell her I am sorry about the whole thing."  Laureen Ochs LPN  August 14, 2950 8:49 AM

## 2010-03-17 NOTE — Progress Notes (Signed)
Summary: TRIAGE  Phone Note Call from Patient Call back at Home Phone [phone] Call back at [phone]   Caller: spouse [name] Call For: [name] Reason for Call: Talk to Nurse Summary of Call: Wife wants to know if Dr Juanda Chance received records from Dr Sharlee Blew from [facility]. Initial call taken by: Tawni Levy,  July 26, 2009 10:40 AM  Follow-up for Phone Call        Records received, they are on Dr.Gokul Waybright's desk for review. Per pt's wife, pt. 'Is still not doing very well. He is at home, but I asked all the records be faxed to Dr.Unknown Schleyer. I just don't know where to turn." Pt. has a poor appetite, loosing weight, "He is just about to give up,  he just isn't doing well, he is about to give up."  DR.Rieley Hausman PLEASE ADVISE  Follow-up by: Laureen Ochs LPN,  July 26, 2009 10:53 AM  Additional Follow-up for Phone Call Additional follow up Details #1::        I will review. Additional Follow-up by: Hart Carwin MD,  July 26, 2009 1:31 PM     Appended Document: TRIAGE I need a discharge summary from the dospitalization., please, before I can see him.  Appended Document: TRIAGE Report is on Dr.Zacari Stiff's desk for review.  Appended Document: TRIAGE Per Dr.Carmilla Granville--Records reviewed, pt. will need to continue care with [name] and complete the current surgery/post surgery care with him. Please ask the oncologist if pt. care can be transferred to [facility] for convenience sake. Pt. will f/u with [name] as needed. Above information reviewed with [name], she agrees. She also let me know that [name] has put the pt. back in the hospital due to pain/abd. fluid build-up. I will advise Dr.Ayaat Jansma. Pt. instructed to call back as needed.

## 2010-03-17 NOTE — Letter (Signed)
Summary: [facility]   Imported By: Kassie Mends 04/23/2009 11:03:56  _____________________________________________________________________  External Attachment:    Type:   Image     Comment:   External Document

## 2010-03-17 NOTE — Progress Notes (Signed)
Summary: EUS  Phone Note Outgoing Call Call back at [facility] Phone [phone]   Call placed by: Chales Abrahams CMA Duncan Dull),  June 09, 2009 2:22 PM Summary of Call: pt appt scheduled for 07/01/09  8 am pt needs instructions and meds to be reviewed. Initial call taken by: Chales Abrahams CMA Duncan Dull),  June 09, 2009 2:23 PM  Follow-up for Phone Call        Phone call completed  pt instructed and meds reviewed. Follow-up by: Chales Abrahams CMA Duncan Dull),  June 10, 2009 11:26 AM  New Problems: NONSPECIFIC ABN FINDING RAD & OTH EXAM GI TRACT (ICD-793.4)   New Problems: NONSPECIFIC ABN FINDING RAD & OTH EXAM GI TRACT (ICD-793.4)

## 2010-03-17 NOTE — Assessment & Plan Note (Signed)
Summary: rov/recurrent chest pain/debra   Primary Provider:  Dr. Shelah Lewandowsky  CC:  ongoing chest pain on his left side. severity of pain 5 .  History of Present Illness: Ricardo Le is seen today in F/U for CAD. He had CABG in 04/2007 by Dr. Cornelius Moras.  He has a normal EF.  He has HTN, elevated lipids and DM.  He has been having a lot of SSCP.  It is especially at night and not necessarily with exertion.  He has repeat back surgery by Dr. Jordan Likes about 3 months ago with relief of leg symptoms, but the chest pains have continued and gotten worse.  Unfortuantely I reviewed his myovue that was done just 2 months before his CABG and it showed apical thinning with no ischemia.  In a diabetic with ongoing SSCP and a false negative myovue I think we should do a repeat cath to make sure he has not had early graft failure.  His pain is left-sided and can radiate down the left arm.  He has not taken nitro for it.  He gets it daily.  I reviiewed his hospital D/C summary from back surgery and there were no complications.  He has been compliant with his meds.  There is no associated dyspnea, diaphoresis, palpitations or edema.    Current Problems (verified): 1)  Pre-operative Cardiovascular Examination  (ICD-V72.81) 2)  Palpitations  (ICD-785.1) 3)  Cad  (ICD-414.00) 4)  Hypercholesterolemia  (ICD-272.0) 5)  Hypertension  (ICD-401.9) 6)  Dm  (ICD-250.00) 7)  Back Pain  (ICD-724.5)  Current Medications (verified): 1)  Simvastatin 40 Mg Tabs (Simvastatin) .... Take One Tablet At Bedtime 2)  Aspirin 81 Mg Tbec (Aspirin) .... Take One Tablet By Mouth Daily 3)  Metformin Hcl 500 Mg Tabs (Metformin Hcl) .... Tab By Mouth Once Daily 4)  Glipizide 10 Mg Tabs (Glipizide) .[name] Kitchen.. 1 Tab By Mouth Once Daily 5)  Amlodipine Besylate 5 Mg Tabs (Amlodipine Besylate) .... Take One Tablet By Mouth Daily 6)  Lisinopril-Hydrochlorothiazide 20-12.5 Mg Tabs (Lisinopril-Hydrochlorothiazide) .[name] Kitchen.. 1 Tab By Mouth Once Daily  Allergies (verified): No  Known Drug Allergies  Past History:  Past Medical History: Last updated: 12/16/2008 Current Problems:  PALPITATIONS (ICD-785.1) CAD (ICD-414.00) HYPERCHOLESTEROLEMIA (ICD-272.0) HYPERTENSION (ICD-401.9) DM (ICD-250.00) BACK PAIN (ICD-724.5)  Previous history of carotid bruits  Past Surgical History: Last updated: 12/16/2008 Right L2-3 re-exploration of laminectomy with redo  microdiskectomy.  05/2008  cyst removed from spine   Percutaneous nephrolithotomy.    CABG x4  2009 (LIMA to LAD, SVG to first circumflex marginal  branch with sequential SVG to second circumflex marginal branch and  saphenous vein to posterior descending coronary artery with endoscopic  vein harvest right lower extremity by Dr. Cornelius Moras on May 14, 2007).   Cystourethroscopy, right retrograde pyelogram, manipulate stone in the renal pelvis, right Double-J catheter.  Pars plana vitrectomy left eye, retinal photocoagulation left eye, membrane peel left eye.  Family History: Last updated: 12/17/2008 non-contributory  Social History: Last updated: 12/17/2008 married does not work due to back problems Sedentary Non-smoker  Family History: non-contributory  Social History: married does not work due to back problems Sedentary Non-smoker  Review of Systems       Denies fever, malais, weight loss, blurry vision, decreased visual acuity, cough, sputum, SOB, hemoptysis, pleuritic pain, palpitaitons, heartburn, abdominal pain, melena, lower extremity edema, claudication, or rash. All other systems reviewed and negative  Vital Signs:  Patient profile:   70 year old male Height:      66  inches Weight:      160 pounds BMI:     24.42 Pulse rate:   85 / minute Resp:     12 per minute BP sitting:   140 / 85  (left arm)  Vitals Entered By: Kem Parkinson (December 17, 2008 2:18 PM)  Physical Exam  General:  Affect appropriate Healthy:  appears stated age HEENT: normal Neck supple with no  adenopathy JVP normal no bruits no thyromegaly Lungs clear with no wheezing and good diaphragmatic motion Heart:  S1/S2 no murmur,rub, gallop or click PMI normal Abdomen: benighn, BS positve, no tenderness, no AAA no bruit.  No HSM or HJR Distal pulses intact with no bruits No edema Neuro non-focal Skin warm and dry recent back surgery with small hematoma   Impression & Recommendations:  Problem # 1:  CAD (ICD-414.00)  Ongoing chest pain with previous false negative myovue.  Cath His updated medication list for this problem includes:    Aspirin 81 Mg Tbec (Aspirin) .[name] Kitchen... Take one tablet by mouth daily    Amlodipine Besylate 5 Mg Tabs (Amlodipine besylate) .[name] Kitchen... Take one tablet by mouth daily    Lisinopril-hydrochlorothiazide 20-12.5 Mg Tabs (Lisinopril-hydrochlorothiazide) .[name] Kitchen... 1 tab by mouth once daily  Orders: EKG w/ Interpretation (93000) TLB-BMP (Basic Metabolic Panel-BMET) (80048-METABOL) TLB-CBC Platelet - w/Differential (85025-CBCD) TLB-PTT (85730-PTTL) TLB-PT (Protime) (85610-PTP) T-2 View CXR (71020TC)  His updated medication list for this problem includes:    Aspirin 81 Mg Tbec (Aspirin) .[name] Kitchen... Take one tablet by mouth daily    Amlodipine Besylate 5 Mg Tabs (Amlodipine besylate) .[name] Kitchen... Take one tablet by mouth daily    Lisinopril-hydrochlorothiazide 20-12.5 Mg Tabs (Lisinopril-hydrochlorothiazide) .[name] Kitchen... 1 tab by mouth once daily  Problem # 2:  HYPERCHOLESTEROLEMIA (ICD-272.0)  Continue statin check labs with cath His updated medication list for this problem includes:    Simvastatin 40 Mg Tabs (Simvastatin) .[name] Kitchen... Take one tablet at bedtime  His updated medication list for this problem includes:    Simvastatin 40 Mg Tabs (Simvastatin) .[name] Kitchen... Take one tablet at bedtime  Problem # 3:  HYPERTENSION (ICD-401.9) Well cotrolled low sodium diet His updated medication list for this problem includes:    Aspirin 81 Mg Tbec (Aspirin) .[name] Kitchen... Take one tablet by mouth daily     Amlodipine Besylate 5 Mg Tabs (Amlodipine besylate) .[name] Kitchen... Take one tablet by mouth daily    Lisinopril-hydrochlorothiazide 20-12.5 Mg Tabs (Lisinopril-hydrochlorothiazide) .[name] Kitchen... 1 tab by mouth once daily  Problem # 4:  DM (ICD-250.00) Hold glucophage before cath His updated medication list for this problem includes:    Aspirin 81 Mg Tbec (Aspirin) .[name] Kitchen... Take one tablet by mouth daily    Metformin Hcl 500 Mg Tabs (Metformin hcl) .[name] Kitchen... Tab by mouth once daily    Glipizide 10 Mg Tabs (Glipizide) .[name] Kitchen... 1 tab by mouth once daily    Lisinopril-hydrochlorothiazide 20-12.5 Mg Tabs (Lisinopril-hydrochlorothiazide) .[name] Kitchen... 1 tab by mouth once daily  Patient Instructions: 1)  Your physician recommends that you schedule a follow-up appointment in: 2 weeks after cardiac cath on 12/24/08 2)  Your physician recommends that you return for lab work today for Basic metabolic panel, cbc, pt, ptt.  3)  Your physician recommends that you continue on your current medications as directed. Please refer to the Current Medication list given to you today. 4)  Your physician has requested that you have a cardiac catheterization.  Cardiac catheterization is used to diagnose and/or treat various heart conditions. Doctors may recommend this procedure for a number of different  reasons. The most common reason is to evaluate chest pain. Chest pain can be a symptom of coronary artery disease (CAD), and cardiac catheterization can show whether plaque is narrowing or blocking your heart's arteries. This procedure is also used to evaluate the valves, as well as measure the blood flow and oxygen levels in different parts of your heart.  For further information please visit [email or web address]  Please follow instruction sheet, as given. Prescriptions: PREDNISONE 20 MG TABS (PREDNISONE) take three tablets at 4:30 pm the evening prior to the cath and 3 tablets at 8:30 the morning of the cath  #6 x 0   Entered by:   Charolotte Capuchin, RN    Authorized by:   Colon Branch, MD, [facility]   Signed by:   Charolotte Capuchin, RN on 12/17/2008   Method used:   Electronically to        [facility] [id number] (retail)       [address]       [place], [place]  [address]       Ph: [phone]       Fax: [phone]   RxID:   [id number]    EKG Report  Procedure date:  12/17/2008  Findings:      NSR 82 LAE Poor R wave progression ? anterior MI LAD inferior MI ? age No change from 05/2007

## 2010-03-17 NOTE — Procedures (Signed)
Summary: COLON   Colonoscopy  Procedure date:  01/15/2006  Findings:      Location:  [facility].    Procedures Next Due Date:    Colonoscopy: 01/2016 Patient Name: Ricardo Le, Ricardo Le. MRN:  Procedure Procedures: Colonoscopy CPT: [phone].  Personnel: Endoscopist: Stephano Arrants L. Juanda Chance, MD.  Referred By: Corinna Capra, MD.  Exam Location: Exam performed in Outpatient Clinic. Outpatient  Patient Consent: Procedure, Alternatives, Risks and Benefits discussed, consent obtained, from patient. Consent was obtained by the RN.  Indications  Average Risk Screening Routine.  History  Current Medications: Patient is not currently taking Coumadin.  Pre-Exam Physical: Performed Jan 15, 2006. Entire physical exam was normal.  Comments: Pt. history reviewed/updated, physical exam performed prior to initiation of sedation?yes Exam Exam: Extent of exam reached: Cecum, extent intended: Cecum.  The cecum was identified by appendiceal orifice and IC valve. Colon retroflexion performed. Images taken. ASA Classification: II. Tolerance: good.  Monitoring: Pulse and BP monitoring, Oximetry used. Supplemental O2 given.  Colon Prep Used Miralax for colon prep. Prep results: good.  Sedation Meds: Patient assessed and found to be appropriate for moderate (conscious) sedation. Fentanyl 75 mcg. given IV. Versed 7 mg. given IV.  Findings - NORMAL EXAM: Cecum.  - NORMAL EXAM: Rectum.    Comments: scope withdrawasl time 9:09 min Assessment Normal examination.  Comments: no polyps Events  Unplanned Interventions: No intervention was required.  Unplanned Events: There were no complications. Plans Patient Education: Patient given standard instructions for: Patient instructed to get routine colonoscopy every 10 years.  Disposition: After procedure patient sent to recovery. After recovery patient sent home.    This report was created from the original endoscopy report,  which was reviewed and signed by the above listed endoscopist.

## 2010-03-17 NOTE — Procedures (Signed)
Summary: Endoscopic Ultrasound  Patient: [name] Note: All result statuses are Final unless otherwise noted.  Tests: (1) Endoscopic Ultrasound (EUS)  EUS Endoscopic Ultrasound                             DONE     [facility]     [address] [place], [place]  [address]           ENDOSCOPIC ULTRASOUND PROCEDURE REPORT           PATIENT:  Ricardo Le, Ricardo Le  MR#:  [id number]     BIRTHDATE:  06/01/1940  GENDER:  male     ENDOSCOPIST:  Rachael Fee, MD     REFERRED BY:  Hedwig Morton. Juanda Chance, M.D.     PROCEDURE DATE:  06/21/2009     PROCEDURE:  Upper EUS w/FNA     ASA CLASS:  Class II     INDICATIONS:  abdominal pain (unclear etiology), pancreatic tail     lesion noted on CT/MRI (normal LFTs, CA 19-9 normal)           MEDICATIONS:  Fentanyl  IV; Versed 6 mg IV     DESCRIPTION OF PROCEDURE:   After the risks, benefits, and     alternatives of the procedure were thoroughly explained, informed     consent was obtained.  The  endoscope was introduced through the     mouth  and advanced to the duodenum.     <<PROCEDUREIMAGES>>           Endoscopic findings:     1. Normal esophagus     2. Mild to moderate gastritis (previously biopsied)     3. Normal duodenum           EUS findings:     1. 15mm by 16mm round, well demarcated, hypoechoic, homogeneous,     solid mass in tail of pancreas.  There was a 4-39mm diameter blood     vessel laying between the scope and the mass, this limited the FNA     window however 2 passes with 22 guage BS EUSFNA needle were     performed.     2. The pancreatic parenchyma was otherwise normal: no signs of     chronic pancreatitis.     3. Main pancreatic duct was normal.     4. No peripancreatic or celiac adenopathy.     5. Limited views of liver, spleen, portal and splenic vessels were     all normal.           Impression:     15mm by 16mm round, hypoechoic solid mass in tail of pancreas     sampled by FNA.   Await final  cytology reading.  Given it's size,     it seems unlikely that this lesion is causing his abd pain     symptoms.  Would await final cytology, he will likely resection     (tail of pancrease +/- splenectomy).  I will communicate these     results with Dr. Juanda Chance.           ______________________________     Rachael Fee, MD           n.     eSIGNED:   Rachael Fee at 06/21/2009 01:52 PM           [place], [place], [id number]  Note:  An exclamation mark (!) indicates a result that was not dispersed into the flowsheet. Document Creation Date: 06/21/2009 1:52 PM _______________________________________________________________________  (1) Order result status: Final Collection or observation date-time: 06/21/2009 13:37 Requested date-time:  Receipt date-time:  Reported date-time:  Referring Physician:   Ordering Physician: Rob Bunting [phone]) Specimen Source:  Source: Launa Grill Order Number: [id number] Lab site:

## 2010-03-17 NOTE — Miscellaneous (Signed)
Summary: Aciphex samples  Clinical Lists Changes  Medications: Changed medication from ACIPHEX 20 MG TBEC (RABEPRAZOLE SODIUM) Take 1 tab 30 min before breakfast until gone to ACIPHEX 20 MG TBEC (RABEPRAZOLE SODIUM) Take 1 tab 30 min before breakfast until gone

## 2010-03-17 NOTE — Progress Notes (Signed)
Summary: TRIAGE  Phone Note Call from Patient Call back at [facility] Phone [phone]   Caller: Patient Call For: Dr. Juanda Chance Summary of Call: Wife would like to discuss pt.--does not feel like he can wait until his appt on 07-01-09 to have procedure Initial call taken by: Karna Christmas,  Jun 17, 2009 2:00 PM  Follow-up for Phone Call        1) Pt. is scheduled for an EUS on 07-01-09. Wife states he is in constant pain, he needs his procedure sooner."You tell her he is not doing well, he is not doing well at all! I can actually sit here and hear him breath. Tell her to send [place] somewhere else to get this test done, as soon as possible!"    2) Pt. is having worsening constipation, using Dulcolox bowel powder daily, no BM for 5 days.  Advised Mrs.Krisher to increase Dulcolax powder to 2 times daily, he may use 3 times daily as needed.  DR.BRODIE PLEASE ADVISE  Follow-up by: Laureen Ochs LPN,  Jun 18, 5619 2:05 PM  Additional Follow-up for Phone Call Additional follow up Details #1::        Pt. wife called back, wanted to know if she takes him to the ER will he be admitted.  Dr.Brodie asked me to let pt. wife know she may take him to the ER, the ER MD can evaluate and determine if he needs admitted.  Pt. wife states,"I bet if he was her husband she would surly do something for him right away! I don't understand why no one will take care of him, he has bee suffering for over a month!"  Mrs. Cregger is aware I will forward this message to Dr.Brodie.  Additional Follow-up by: Laureen Ochs LPN,  Jun 17, 3084 4:53 PM    Additional Follow-up for Phone Call Additional follow up Details #2::    I have talked to the pt. He does not sound at all as distressed as his wife presents it. He  takes Miralax for constipation. Has pain meds available. I gave him different options, he wants to try Miralax two times a day. I told him that the pancreatic lesion is not likely causing his pain.  Follow-up by:  Hart Carwin MD,  Jun 17, 2009 5:55 PM

## 2010-03-17 NOTE — Letter (Signed)
Summary: EGD Instructions  [facility]  [address]   [place], [place] [address]   Phone: [phone]  Fax: [phone]       Ricardo Le    1940/05/04    MRN: [id number]       Procedure Day /Date:  Monday    06-14-09     Arrival Time:  7:30 a.m.     Procedure Time: 8:30 a.m.     Location of Procedure:                    x    Endoscopy Center (4th Floor)  PREPARATION FOR ENDOSCOPY   On   06-14-09   THE DAY OF THE PROCEDURE:  1.   No solid foods, milk or milk products are allowed after midnight the night before your procedure.  2.   Do not drink anything colored red or purple.  Avoid juices with pulp.  No orange juice.  3.  You may drink clear liquids until  6:30 a.m., which is 2 hours before your procedure.                                                                                                CLEAR LIQUIDS INCLUDE: Water Jello Ice Popsicles Tea (sugar ok, no milk/cream) Powdered fruit flavored drinks Coffee (sugar ok, no milk/cream) Gatorade Juice: apple, white grape, white cranberry  Lemonade Clear bullion, consomm, broth Carbonated beverages (any kind) Strained chicken noodle soup Hard Candy   MEDICATION INSTRUCTIONS  Unless otherwise instructed, you should take regular prescription medications with a small sip of water as early as possible the morning of your procedure.  Diabetic patients - see separate instructions.    Additional medication instructions: Hold Lisinopril/HCTZ the morning of procedure.             OTHER INSTRUCTIONS  You will need a responsible adult at least 69 years of age to accompany you and drive you home.   This person must remain in the waiting room during your procedure.  Wear loose fitting clothing that is easily removed.  Leave jewelry and other valuables at home.  However, you may wish to bring a book to read or an iPod/MP3 player to listen to music as you wait for your procedure to  start.  Remove all body piercing jewelry and leave at home.  Total time from sign-in until discharge is approximately 2-3 hours.  You should go home directly after your procedure and rest.  You can resume normal activities the day after your procedure.  The day of your procedure you should not:   Drive   Make legal decisions   Operate machinery   Drink alcohol   Return to work  You will receive specific instructions about eating, activities and medications before you leave.    The above instructions have been reviewed and explained to me by   Wyona Almas RN  June 11, 2009 9:06 AM     I fully understand and can verbalize these instructions _____________________________ Date _________

## 2010-03-17 NOTE — Progress Notes (Signed)
Summary: Biopsy results  Phone Note Call from Patient Call back at Home Phone [phone]   Caller: Spouse Call For: DR JACOBS Summary of Call: Her discharge paper says biopsy results done Mnday would be available today. Initial call taken by: Leanor Kail [facility],  Jun 23, 2009 9:52 AM  Follow-up for Phone Call        pt made aware that the results are not yet available. I will call her as soon as it is recieved Chales Abrahams CMA Duncan Dull)  Jun 23, 2009 9:55 AM

## 2010-03-17 NOTE — Progress Notes (Signed)
Summary: Aciphex change  Phone Note From Pharmacy   Summary of Call: recieved a fax from the pharmacy requesting Aciphex be changed to two times a day dosing ompeprazole for insurance purposes.  Dr Juanda Chance ok'd the change.   Initial call taken by: Chales Abrahams CMA Duncan Dull),  Jun 24, 2009 3:35 PM    New/Updated Medications: OMEPRAZOLE 20 MG CPDR (OMEPRAZOLE) 1 by mouth 20-30 minutes before breakfast and dinner meal. Prescriptions: OMEPRAZOLE 20 MG CPDR (OMEPRAZOLE) 1 by mouth 20-30 minutes before breakfast and dinner meal.  #60 x 3   Entered by:   Chales Abrahams CMA (AAMA)   Authorized by:   Hart Carwin MD   Signed by:   Chales Abrahams CMA (AAMA) on 06/24/2009   Method used:   Electronically to        [facility] [id number] (retail)       [address]       [place], [place]  [address]       Ph: [phone]       Fax: [phone]   RxID:   [id number]

## 2010-03-17 NOTE — Letter (Signed)
Summary: Diabetic Instructions  [facility]  [address] [place], [place] [address]   Phone: [phone]  Fax: [phone]    Ricardo Le 02/07/1941 MRN: [id number]   _ x _   ORAL DIABETIC MEDICATION INSTRUCTIONS  The day before your procedure:   Take your diabetic pill as you do normally  The day of your procedure:   Do not take your diabetic pill    We will check your blood sugar levels during the admission process and again in Recovery before discharging you home  ________________________________________________________________________

## 2010-03-17 NOTE — Progress Notes (Signed)
Summary: surg clearance  Phone Note Call from Patient Call back at Home Phone [phone]   Reason for Call: Talk to Nurse Summary of Call: pt wife calling to see if he has been cleared for back surgery. Dr Otelia Sergeant [facility] fax [phone] phone [phone] office sent over information already but has not heard back from [place]. Initial call taken by: Faythe Ghee,  June 10, 2008 11:27 AM  Follow-up for Phone Call        spoke with pts wife, will discuss with dr Eden Emms this afternoon. Deliah Goody, RN  June 10, 2008 12:07 PM   Additional Follow-up for Phone Call Additional follow up Details #1::        dr Eden Emms has given okay for pt to have procedure and clearence has been faxed to [facility] orth[facility],pt aware. Deliah Goody, RN  June 11, 2008 12:27 PM

## 2010-03-17 NOTE — Letter (Signed)
Summary: [facility]  [facility]   Imported By: Sherian Rein 07/29/2009 14:23:34  _____________________________________________________________________  External Attachment:    Type:   Image     Comment:   External Document

## 2010-03-17 NOTE — Assessment & Plan Note (Signed)
Summary: BLACK STOOLS, WORSENING ABD. PAIN       (DR.BRODIE PT.)   DEB...   History of Present Illness Visit Type: Follow-up Visit Primary GI MD: Lina Sar MD Primary Provider: Windle Guard, MD  Requesting Provider: n/a Chief Complaint: abdominal pain, explosive diarrhea, forced stools History of Present Illness:   70 YO MALE KNOWN TO DR. Juanda Chance WHO WAS SEEN A FEW WEEKS AGO WITH CONSTIPATION RELATED TO RECENT BACK SURGERIES. HE WAS STARTED ON MIRALAX EVERY OTHER DAY. HE CALLED BACK ONCE SINCE WITH ABOMINAL DISCOMFORT,AND WAS CALLED IN SOME BENTYL 20 MG TWICE DAILY. HE COMES IN TODAY WITH C/O BURNING PAIN IN HIS ABDOMEN, MOSTLY IN THE UPPER ABDOMEN,BUT ALSO SOME IN THE BILATERAL LQ.[name] Kitchen HE FEELS THAT HE IS STILL CONSTIPATED AND THAT HE NEEDS MORE MIRALAX. HE DID FINALLY HAVE A RATHER EXPLOSIVE STOOL LAST PM,AND FEELS ALITTLE BETTER.. NO MELENA OR HEMA BUT STOOLS HAVE BEEN DARK. NO N/V,EATING FAIRLY WELL. NO FEVER ETC. ALSO NOTES SOME URINARY BURNING. COLONOSCOPY 2007 WAS NORMAL.   GI Review of Systems    Reports abdominal pain and  loss of appetite.     Location of  Abdominal pain: generalized.    Denies acid reflux, belching, bloating, chest pain, dysphagia with liquids, dysphagia with solids, heartburn, nausea, vomiting, vomiting blood, and  weight loss.      Reports black tarry stools, change in bowel habits, and  constipation.     Denies anal fissure, diverticulosis, fecal incontinence, heme positive stool, hemorrhoids, irritable bowel syndrome, jaundice, light color stool, rectal bleeding, and  rectal pain.    Current Medications (verified): 1)  Simvastatin 40 Mg Tabs (Simvastatin) .... Take One Tablet At Bedtime 2)  Aspirin 81 Mg Tbec (Aspirin) .... Take One Tablet By Mouth Daily 3)  Metformin Hcl 500 Mg Tabs (Metformin Hcl) .... Tab By Mouth Once Daily 4)  Glipizide 10 Mg Tabs (Glipizide) .[name] Kitchen.. 1 Tab By Mouth Once Daily 5)  Amlodipine Besylate 5 Mg Tabs (Amlodipine Besylate) ....  Take One Tablet By Mouth Daily 6)  Lisinopril-Hydrochlorothiazide 20-12.5 Mg Tabs (Lisinopril-Hydrochlorothiazide) .[name] Kitchen.. 1 Tab By Mouth Once Daily 7)  Milk of Magnesia 400 Mg/26ml Susp (Magnesium Hydroxide) .... As Needed 8)  Mineral Oil  Oil (Mineral Oil) .... As Needed 9)  Stool Softener 100 Mg Caps (Docusate Sodium) .... As Needed 10)  Miralax  Powd (Polyethylene Glycol 3350) .... Take 1/2 Capful Powder Dissolved in At Least 8 Ounces of Water/juice Two Times Per Week. 11)  Bentyl 20 Mg  Tabs (Dicyclomine Hcl) .... Take 1 By Mouth Two Times Daily As Needed For Abd. Pain.  Allergies (verified): 1)  ! * Ivp Dye  Past History:  Past Medical History: Current Problems:  PALPITATIONS (ICD-785.1) CAD (ICD-414.00)-MULTIVESSEL HYPERCHOLESTEROLEMIA (ICD-272.0) HYPERTENSION (ICD-401.9) DM (ICD-250.00) DEGENERATIVE DISC DISEASE,SEVERAL SURGERIES  Previous history of carotid bruits  Past Surgical History: Reviewed history from 05/10/2009 and no changes required. Right L2-3 re-exploration of laminectomy with redo  microdiskectomy.  05/2008 cyst removed from spine  Percutaneous nephrolithotomy.    CABG x4  2009 (LIMA to LAD, SVG to first circumflex marginal  branch with sequential SVG to second circumflex marginal branch and  saphenous vein to posterior descending coronary artery with endoscopic  vein harvest right lower extremity by Dr. Cornelius Moras on May 14, 2007).  Cystourethroscopy, right retrograde pyelogram, manipulate stone in the renal pelvis, right Double-J catheter.  Pars plana vitrectomy left eye, retinal photocoagulation left eye, membrane peel left eye. Cholecystectomy Appendectomy Right Knee Arthroscopy  Family History:  Reviewed history from 05/11/2009 and no changes required. non-contributory Family History of Heart Disease: Father Family History of Diabetes: Brothers Type 2  No FH of Colon Cancer:  Social History: Reviewed history from 05/11/2009 and no changes  required. Married Does not work due to back problems Sedentary Non-smoker Occupation: Retired Optician, dispensing Alcohol Use - no Illicit Drug Use - no Patient gets regular exercise.  Review of Systems       The patient complains of back pain, heart rhythm changes, sore throat, and urination changes/pain.  The patient denies allergy/sinus, anemia, anxiety-new, arthritis/joint pain, blood in urine, breast changes/lumps, change in vision, confusion, cough, coughing up blood, depression-new, fainting, fatigue, fever, headaches-new, hearing problems, heart murmur, itching, menstrual pain, muscle pains/cramps, night sweats, nosebleeds, pregnancy symptoms, shortness of breath, skin rash, sleeping problems, swelling of feet/legs, swollen lymph glands, thirst - excessive , urination - excessive , urine leakage, vision changes, and voice change.         ROS OTHERWISE AS IN HPI  Vital Signs:  Patient profile:   70 year old male Height:      68 inches Weight:      158.50 pounds BMI:     24.19 Pulse rate:   80 / minute Pulse rhythm:   regular BP sitting:   128 / 60  (left arm) Cuff size:   regular  Vitals Entered By: June McMurray CMA Duncan Dull) (May 27, 2009 1:22 PM)  Physical Exam  General:  Well developed, well nourished, no acute distress. Head:  Normocephalic and atraumatic. Eyes:  PERRLA, no icterus. Lungs:  Clear throughout to auscultation. Heart:  Regular rate and rhythm; no murmurs, rubs,  or bruits. Abdomen:  SOFT,NO FOCAL TENDERNESS, BS+, NO MASS OR HSM,NONDISTENDED. Rectal:  BROWN, HEME NEGATIVE STOOL. Extremities:  No clubbing, cyanosis, edema or deformities noted. Neurologic:  Alert and  oriented x4;  grossly normal neurologically. Psych:  Alert and cooperative. Normal mood and affect.   Impression & Recommendations:  Problem # 1:  ABDOMINAL PAIN, GENERALIZED (ICD-789.07) Assessment Deteriorated 70 YO MALE WITH CONSTIPATION POST RECENTBACK SURGERIES WITH NEW C/O BURNING LOWER AND  EPIGASTRIC DISCOMFORT ;ETIOLOGY NOT CLEAR ?MED SIDE EFFECT WITH BENTYL OR MIRALAX,R/O SECONDARY TO OBSTIPATION.  LABS AS BELOW STOP BENTYL AS NOT HELPING TRIAL OF DULCOLAX BALANCE 17 GM DAILY IN WATER TRIAL OF ACIPHEX 20 MG DAILY IN AM-SAMPLES GIVEN PT TO CALL IN 24-48 HOURS IF NO BETTER-MAY NEED IMAGING.  Orders: TLB-CMP (Comprehensive Metabolic Pnl) (80053-COMP) TLB-CBC Platelet - w/Differential (85025-CBCD) TLB-Lipase (83690-LIPASE) TLB-Udip w/ Micro (81001-URINE)  Problem # 2:  CONSTIPATION (ICD-564.00) Assessment: Comment Only  Orders: TLB-CMP (Comprehensive Metabolic Pnl) (80053-COMP) TLB-CBC Platelet - w/Differential (85025-CBCD) TLB-Lipase (83690-LIPASE) TLB-Udip w/ Micro (81001-URINE)  Problem # 3:  CAD (ICD-414.00) Assessment: Comment Only  Problem # 4:  DM (ICD-250.00) Assessment: Comment Only  Patient Instructions: 1)  We have given you Aciphex tablet samples, take 1 tab until gone. 2)  We have given you Dulcolax Balance powder samples.  Take 17 grams in a glass of water or juice. Take as needed  for constipation. 3)  Stop the Bentyl and Miralax. 4)  If you feel worse in the morning, Fri 4-15, call us in the morning at [phone] and ask for Pam.  You may need a CT scan per Briceyda Abdullah PA-C.  5)  Copy sent to : Windle Guard, Md 6)  The medication list was reviewed and reconciled.  All changed / newly prescribed medications were explained.  A complete medication list was  provided to the patient / caregiver.

## 2010-03-17 NOTE — Miscellaneous (Signed)
Summary: Carafate Rx  Clinical Lists Changes  Medications: Added new medication of CARAFATE 1 GM/10ML SUSP (SUCRALFATE) 10cc by mouth two times a day - Signed Rx of CARAFATE 1 GM/10ML SUSP (SUCRALFATE) 10cc by mouth two times a day;  #12oz x 1;  Signed;  Entered by: Jennye Boroughs RN;  Authorized by: Hart Carwin MD;  Method used: Electronically to [facility] [address]*, [address] [place], [place]  [address], Ph: [phone], Fax: [phone]    Prescriptions: CARAFATE 1 GM/10ML SUSP (SUCRALFATE) 10cc by mouth two times a day  #12oz x 1   Entered by:   Jennye Boroughs RN   Authorized by:   Hart Carwin MD   Signed by:   Jennye Boroughs RN on 06/14/2009   Method used:   Electronically to        [facility] [id number] (retail)       [address] [place], [place]  [address]       Ph: [phone]       Fax: [phone]   RxID:   [id number]

## 2010-03-17 NOTE — Op Note (Signed)
Summary: [facility]  [facility]   Imported By: Sherian Rein 07/29/2009 14:17:19  _____________________________________________________________________  External Attachment:    Type:   Image     Comment:   External Document

## 2010-03-17 NOTE — Progress Notes (Signed)
Summary: TRIAGE-Abd Pain  Phone Note Call from Patient Call back at Home Phone [phone]   Call For: Dr Juanda Chance Complaint: Nausea/Vomiting/Diarrhea Summary of Call: Abd Pain- was given medicine which is not working. Wants something else or an appoinment to be seen. Initial call taken by: Leanor Kail [facility],  May 18, 2009 10:01 AM  Follow-up for Phone Call        Last ov 05-11-09. Was given Miralax for constipation, pt. stopped Miralax Saturday because his abd. is hurting.  Pt.had a BM yesterday. Now c/o "Burning and hurting" below his navel. Also increased bloating and belching. States his stools are "Real dark" Pain is worse when he stands and walks. Denies blood, fever, n/v.   DR.Sofi Bryars PLEASE ADVISE  Follow-up by: Laureen Ochs LPN,  May 18, 2009 11:40 AM  Additional Follow-up for Phone Call Additional follow up Details #1::        Bentyl 20 mg by mouth two times a day, # 30, 1 refill Additional Follow-up by: Hart Carwin MD,  May 18, 2009 6:28 PM    Additional Follow-up for Phone Call Additional follow up Details #2::    Above MD orders reviewed with patient's wife. Also, Gas-x,Phazyme, etc. as needed for gas & bloating, Soft,bland diet. No spicy,greasy,fried foods. Advance diet as tolerated, May try Prilosec OTC for any epigastric burning. If symptoms become worse call back immediately or go to ER. Pt. instructed to call back as needed.  Follow-up by: Laureen Ochs LPN,  May 19, 2009 10:21 AM  New/Updated Medications: BENTYL 20 MG  TABS (DICYCLOMINE HCL) Take 1 by mouth two times daily as needed for abd. pain. Prescriptions: BENTYL 20 MG  TABS (DICYCLOMINE HCL) Take 1 by mouth two times daily as needed for abd. pain.  #30 x 1   Entered by:   Laureen Ochs LPN   Authorized by:   Hart Carwin MD   Signed by:   Laureen Ochs LPN on 02/21/3233   Method used:   Electronically to        [facility] [id number] (retail)       [address] [place], [place]   [address]       Ph: [phone]       Fax: [phone]   RxID:   [id number]

## 2010-03-18 NOTE — Letter (Signed)
Summary: [facility]   Imported By: Sherian Rein 07/02/2009 09:05:57  _____________________________________________________________________  External Attachment:    Type:   Image     Comment:   External Document

## 2010-05-01 LAB — DIFFERENTIAL
Basophils Absolute: 0 10*3/uL (ref 0.0–0.1)
Basophils Relative: 1 % (ref 0–1)
Eosinophils Absolute: 0.2 10*3/uL (ref 0.0–0.7)
Eosinophils Relative: 3 % (ref 0–5)
Lymphocytes Relative: 29 % (ref 12–46)
Lymphs Abs: 1.7 10*3/uL (ref 0.7–4.0)
Monocytes Absolute: 0.7 10*3/uL (ref 0.1–1.0)
Monocytes Relative: 12 % (ref 3–12)
Neutro Abs: 3.3 10*3/uL (ref 1.7–7.7)
Neutrophils Relative %: 56 % (ref 43–77)

## 2010-05-01 LAB — BASIC METABOLIC PANEL
BUN: 21 mg/dL (ref 6–23)
CO2: 28 mEq/L (ref 19–32)
Calcium: 9.8 mg/dL (ref 8.4–10.5)
Chloride: 97 mEq/L (ref 96–112)
Creatinine, Ser: 1.02 mg/dL (ref 0.4–1.5)
GFR calc Af Amer: >60 mL/min (ref >60)
GFR calc non Af Amer: >60 mL/min (ref >60)
Glucose, Bld: 96 mg/dL (ref 70–99)
Potassium: 3.8 mEq/L (ref 3.5–5.1)
Sodium: 137 mEq/L (ref 135–145)

## 2010-05-01 LAB — CBC
HCT: 43.8 % (ref 39.0–52.0)
Hemoglobin: 15.2 g/dL (ref 13.0–17.0)
MCHC: 34.7 g/dL (ref 30.0–36.0)
MCV: 93.9 fL (ref 78.0–100.0)
Platelets: 204 10*3/uL (ref 150–400)
RBC: 4.67 MIL/uL (ref 4.22–5.81)
RDW: 13.3 % (ref 11.5–15.5)
WBC: 5.9 10*3/uL (ref 4.0–10.5)

## 2010-05-01 LAB — TYPE AND SCREEN
ABO/RH(D): A POS
Antibody Screen: NEGATIVE

## 2010-05-01 LAB — GLUCOSE, CAPILLARY
Glucose-Capillary: 147 mg/dL — ABNORMAL HIGH (ref 70–99)
Glucose-Capillary: 162 mg/dL — ABNORMAL HIGH (ref 70–99)

## 2010-05-03 LAB — COMPREHENSIVE METABOLIC PANEL
ALT: 29 U/L (ref 0–53)
AST: 25 U/L (ref 0–37)
Albumin: 4.3 g/dL (ref 3.5–5.2)
Alkaline Phosphatase: 58 U/L (ref 39–117)
BUN: 19 mg/dL (ref 6–23)
CO2: 25 mEq/L (ref 19–32)
Calcium: 9.6 mg/dL (ref 8.4–10.5)
Chloride: 98 mEq/L (ref 96–112)
Creatinine, Ser: 0.9 mg/dL (ref 0.4–1.5)
GFR calc Af Amer: >60 mL/min (ref >60)
GFR calc non Af Amer: >60 mL/min (ref >60)
Glucose, Bld: 233 mg/dL — ABNORMAL HIGH (ref 70–99)
Potassium: 4 mEq/L (ref 3.5–5.1)
Sodium: 135 mEq/L (ref 135–145)
Total Bilirubin: 1.2 mg/dL (ref 0.3–1.2)
Total Protein: 7.7 g/dL (ref 6.0–8.3)

## 2010-05-03 LAB — DIFFERENTIAL
Basophils Absolute: 0.1 10*3/uL (ref 0.0–0.1)
Basophils Relative: 1 % (ref 0–1)
Eosinophils Absolute: 0.1 10*3/uL (ref 0.0–0.7)
Eosinophils Relative: 0 % (ref 0–5)
Lymphocytes Relative: 7 % — ABNORMAL LOW (ref 12–46)
Lymphs Abs: 1 10*3/uL (ref 0.7–4.0)
Monocytes Absolute: 0.8 10*3/uL (ref 0.1–1.0)
Monocytes Relative: 6 % (ref 3–12)
Neutro Abs: 11.5 10*3/uL — ABNORMAL HIGH (ref 1.7–7.7)
Neutrophils Relative %: 86 % — ABNORMAL HIGH (ref 43–77)

## 2010-05-03 LAB — CBC
HCT: 44.2 % (ref 39.0–52.0)
Hemoglobin: 15.1 g/dL (ref 13.0–17.0)
MCHC: 34.1 g/dL (ref 30.0–36.0)
MCV: 93.3 fL (ref 78.0–100.0)
Platelets: 223 10*3/uL (ref 150–400)
RBC: 4.73 MIL/uL (ref 4.22–5.81)
RDW: 12.4 % (ref 11.5–15.5)
WBC: 13.4 10*3/uL — ABNORMAL HIGH (ref 4.0–10.5)

## 2010-05-03 LAB — LIPASE, BLOOD: Lipase: 36 U/L (ref 11–59)

## 2010-05-03 LAB — GLUCOSE, CAPILLARY
Glucose-Capillary: 174 mg/dL — ABNORMAL HIGH (ref 70–99)
Glucose-Capillary: 176 mg/dL — ABNORMAL HIGH (ref 70–99)
Glucose-Capillary: 192 mg/dL — ABNORMAL HIGH (ref 70–99)
Glucose-Capillary: 240 mg/dL — ABNORMAL HIGH (ref 70–99)

## 2010-05-18 LAB — POCT I-STAT GLUCOSE
Glucose, Bld: 174 mg/dL — ABNORMAL HIGH (ref 70–99)
Operator id: 141321

## 2010-05-19 LAB — DIFFERENTIAL
Basophils Absolute: 0 10*3/uL (ref 0.0–0.1)
Basophils Relative: 0 % (ref 0–1)
Eosinophils Absolute: 0.2 10*3/uL (ref 0.0–0.7)
Eosinophils Relative: 2 % (ref 0–5)
Lymphocytes Relative: 21 % (ref 12–46)
Lymphs Abs: 1.6 10*3/uL (ref 0.7–4.0)
Monocytes Absolute: 0.7 10*3/uL (ref 0.1–1.0)
Monocytes Relative: 9 % (ref 3–12)
Neutro Abs: 5.2 10*3/uL (ref 1.7–7.7)
Neutrophils Relative %: 67 % (ref 43–77)

## 2010-05-19 LAB — CBC
HCT: 42.8 % (ref 39.0–52.0)
Hemoglobin: 15 g/dL (ref 13.0–17.0)
MCHC: 35 g/dL (ref 30.0–36.0)
MCV: 93.8 fL (ref 78.0–100.0)
Platelets: 178 10*3/uL (ref 150–400)
RBC: 4.57 MIL/uL (ref 4.22–5.81)
RDW: 12.9 % (ref 11.5–15.5)
WBC: 7.7 10*3/uL (ref 4.0–10.5)

## 2010-05-19 LAB — COMPREHENSIVE METABOLIC PANEL
ALT: 12 U/L (ref 0–53)
AST: 20 U/L (ref 0–37)
Albumin: 4.6 g/dL (ref 3.5–5.2)
Alkaline Phosphatase: 57 U/L (ref 39–117)
BUN: 13 mg/dL (ref 6–23)
CO2: 29 mEq/L (ref 19–32)
Calcium: 10 mg/dL (ref 8.4–10.5)
Chloride: 103 mEq/L (ref 96–112)
Creatinine, Ser: 0.9 mg/dL (ref 0.4–1.5)
GFR calc Af Amer: >60 mL/min (ref >60)
GFR calc non Af Amer: >60 mL/min (ref >60)
Glucose, Bld: 131 mg/dL — ABNORMAL HIGH (ref 70–99)
Potassium: 4.8 mEq/L (ref 3.5–5.1)
Sodium: 141 mEq/L (ref 135–145)
Total Bilirubin: 0.5 mg/dL (ref 0.3–1.2)
Total Protein: 7.6 g/dL (ref 6.0–8.3)

## 2010-05-19 LAB — GLUCOSE, CAPILLARY
Glucose-Capillary: 161 mg/dL — ABNORMAL HIGH (ref 70–99)
Glucose-Capillary: 178 mg/dL — ABNORMAL HIGH (ref 70–99)
Glucose-Capillary: 301 mg/dL — ABNORMAL HIGH (ref 70–99)

## 2010-05-19 LAB — TYPE AND SCREEN
ABO/RH(D): A POS
Antibody Screen: NEGATIVE

## 2010-05-24 LAB — BASIC METABOLIC PANEL
BUN: 18 mg/dL (ref 6–23)
CO2: 31 mEq/L (ref 19–32)
Calcium: 9.7 mg/dL (ref 8.4–10.5)
Chloride: 100 mEq/L (ref 96–112)
Creatinine, Ser: 1 mg/dL (ref 0.4–1.5)
GFR calc Af Amer: >60 mL/min (ref >60)
GFR calc non Af Amer: >60 mL/min (ref >60)
Glucose, Bld: 158 mg/dL — ABNORMAL HIGH (ref 70–99)
Potassium: 4.5 mEq/L (ref 3.5–5.1)
Sodium: 139 mEq/L (ref 135–145)

## 2010-05-24 LAB — GLUCOSE, CAPILLARY
Glucose-Capillary: 130 mg/dL — ABNORMAL HIGH (ref 70–99)
Glucose-Capillary: 147 mg/dL — ABNORMAL HIGH (ref 70–99)
Glucose-Capillary: 160 mg/dL — ABNORMAL HIGH (ref 70–99)
Glucose-Capillary: 172 mg/dL — ABNORMAL HIGH (ref 70–99)
Glucose-Capillary: 185 mg/dL — ABNORMAL HIGH (ref 70–99)
Glucose-Capillary: 200 mg/dL — ABNORMAL HIGH (ref 70–99)

## 2010-05-24 LAB — DIFFERENTIAL
Basophils Absolute: 0 10*3/uL (ref 0.0–0.1)
Basophils Relative: 1 % (ref 0–1)
Eosinophils Absolute: 0.1 10*3/uL (ref 0.0–0.7)
Eosinophils Relative: 2 % (ref 0–5)
Lymphocytes Relative: 24 % (ref 12–46)
Lymphs Abs: 1.8 10*3/uL (ref 0.7–4.0)
Monocytes Absolute: 0.7 10*3/uL (ref 0.1–1.0)
Monocytes Relative: 10 % (ref 3–12)
Neutro Abs: 4.8 10*3/uL (ref 1.7–7.7)
Neutrophils Relative %: 64 % (ref 43–77)

## 2010-05-24 LAB — CBC
HCT: 44.1 % (ref 39.0–52.0)
Hemoglobin: 15.2 g/dL (ref 13.0–17.0)
MCHC: 34.4 g/dL (ref 30.0–36.0)
MCV: 91.1 fL (ref 78.0–100.0)
Platelets: 210 10*3/uL (ref 150–400)
RBC: 4.84 MIL/uL (ref 4.22–5.81)
RDW: 13.1 % (ref 11.5–15.5)
WBC: 7.5 10*3/uL (ref 4.0–10.5)

## 2010-05-24 LAB — HEMOGLOBIN A1C
Hgb A1c MFr Bld: 6.5 % — ABNORMAL HIGH (ref 4.6–6.1)
Mean Plasma Glucose: 140 mg/dL

## 2010-05-30 LAB — URINALYSIS, ROUTINE W REFLEX MICROSCOPIC
Bilirubin Urine: NEGATIVE
Glucose, UA: NEGATIVE mg/dL
Hgb urine dipstick: NEGATIVE
Ketones, ur: NEGATIVE mg/dL
Nitrite: NEGATIVE
Protein, ur: NEGATIVE mg/dL
Specific Gravity, Urine: 1.018 (ref 1.005–1.030)
Urobilinogen, UA: 1 mg/dL (ref 0.0–1.0)
pH: 6.5 (ref 5.0–8.0)

## 2010-05-30 LAB — BASIC METABOLIC PANEL
BUN: 6 mg/dL (ref 6–23)
BUN: 7 mg/dL (ref 6–23)
CO2: 28 mEq/L (ref 19–32)
CO2: 30 mEq/L (ref 19–32)
Calcium: 8.1 mg/dL — ABNORMAL LOW (ref 8.4–10.5)
Calcium: 8.3 mg/dL — ABNORMAL LOW (ref 8.4–10.5)
Chloride: 102 mEq/L (ref 96–112)
Chloride: 104 mEq/L (ref 96–112)
Creatinine, Ser: 0.95 mg/dL (ref 0.4–1.5)
Creatinine, Ser: 1.04 mg/dL (ref 0.4–1.5)
GFR calc Af Amer: >60 mL/min (ref >60)
GFR calc Af Amer: >60 mL/min (ref >60)
GFR calc non Af Amer: >60 mL/min (ref >60)
GFR calc non Af Amer: >60 mL/min (ref >60)
Glucose, Bld: 185 mg/dL — ABNORMAL HIGH (ref 70–99)
Glucose, Bld: 199 mg/dL — ABNORMAL HIGH (ref 70–99)
Potassium: 3.7 mEq/L (ref 3.5–5.1)
Potassium: 4.3 mEq/L (ref 3.5–5.1)
Sodium: 135 mEq/L (ref 135–145)
Sodium: 139 mEq/L (ref 135–145)

## 2010-05-30 LAB — COMPREHENSIVE METABOLIC PANEL
ALT: 18 U/L (ref 0–53)
AST: 18 U/L (ref 0–37)
Albumin: 4.1 g/dL (ref 3.5–5.2)
Alkaline Phosphatase: 50 U/L (ref 39–117)
BUN: 14 mg/dL (ref 6–23)
CO2: 28 mEq/L (ref 19–32)
Calcium: 9.7 mg/dL (ref 8.4–10.5)
Chloride: 102 mEq/L (ref 96–112)
Creatinine, Ser: 0.82 mg/dL (ref 0.4–1.5)
GFR calc Af Amer: >60 mL/min (ref >60)
GFR calc non Af Amer: >60 mL/min (ref >60)
Glucose, Bld: 172 mg/dL — ABNORMAL HIGH (ref 70–99)
Potassium: 4.3 mEq/L (ref 3.5–5.1)
Sodium: 138 mEq/L (ref 135–145)
Total Bilirubin: 1.1 mg/dL (ref 0.3–1.2)
Total Protein: 6.7 g/dL (ref 6.0–8.3)

## 2010-05-30 LAB — CBC
HCT: 28.5 % — ABNORMAL LOW (ref 39.0–52.0)
HCT: 43.8 % (ref 39.0–52.0)
Hemoglobin: 14.9 g/dL (ref 13.0–17.0)
Hemoglobin: 9.8 g/dL — ABNORMAL LOW (ref 13.0–17.0)
MCHC: 34 g/dL (ref 30.0–36.0)
MCHC: 34.3 g/dL (ref 30.0–36.0)
MCV: 93.7 fL (ref 78.0–100.0)
MCV: 94.1 fL (ref 78.0–100.0)
Platelets: 171 10*3/uL (ref 150–400)
Platelets: 213 10*3/uL (ref 150–400)
RBC: 3.04 MIL/uL — ABNORMAL LOW (ref 4.22–5.81)
RBC: 4.68 MIL/uL (ref 4.22–5.81)
RDW: 13.3 % (ref 11.5–15.5)
RDW: 13.4 % (ref 11.5–15.5)
WBC: 6 10*3/uL (ref 4.0–10.5)
WBC: 8.9 10*3/uL (ref 4.0–10.5)

## 2010-05-30 LAB — GLUCOSE, CAPILLARY
Glucose-Capillary: 161 mg/dL — ABNORMAL HIGH (ref 70–99)
Glucose-Capillary: 164 mg/dL — ABNORMAL HIGH (ref 70–99)
Glucose-Capillary: 174 mg/dL — ABNORMAL HIGH (ref 70–99)
Glucose-Capillary: 182 mg/dL — ABNORMAL HIGH (ref 70–99)
Glucose-Capillary: 201 mg/dL — ABNORMAL HIGH (ref 70–99)
Glucose-Capillary: 209 mg/dL — ABNORMAL HIGH (ref 70–99)
Glucose-Capillary: 212 mg/dL — ABNORMAL HIGH (ref 70–99)
Glucose-Capillary: 228 mg/dL — ABNORMAL HIGH (ref 70–99)
Glucose-Capillary: 233 mg/dL — ABNORMAL HIGH (ref 70–99)
Glucose-Capillary: 242 mg/dL — ABNORMAL HIGH (ref 70–99)
Glucose-Capillary: 249 mg/dL — ABNORMAL HIGH (ref 70–99)
Glucose-Capillary: 257 mg/dL — ABNORMAL HIGH (ref 70–99)
Glucose-Capillary: 271 mg/dL — ABNORMAL HIGH (ref 70–99)

## 2010-05-30 LAB — HEMOGLOBIN AND HEMATOCRIT, BLOOD
HCT: 29.4 % — ABNORMAL LOW (ref 39.0–52.0)
Hemoglobin: 10.2 g/dL — ABNORMAL LOW (ref 13.0–17.0)

## 2010-05-30 LAB — PROTIME-INR
INR: 0.9 (ref 0.00–1.49)
Prothrombin Time: 12.4 seconds (ref 11.6–15.2)

## 2010-05-30 LAB — HEMOGLOBIN A1C
Hgb A1c MFr Bld: 6.3 % — ABNORMAL HIGH (ref 4.6–6.1)
Mean Plasma Glucose: 134 mg/dL

## 2010-05-30 LAB — POCT I-STAT 4, (NA,K, GLUC, HGB,HCT)
Glucose, Bld: 237 mg/dL — ABNORMAL HIGH (ref 70–99)
HCT: 34 % — ABNORMAL LOW (ref 39.0–52.0)
Hemoglobin: 11.6 g/dL — ABNORMAL LOW (ref 13.0–17.0)
Potassium: 3.9 mEq/L (ref 3.5–5.1)
Sodium: 138 mEq/L (ref 135–145)

## 2010-05-30 LAB — DIFFERENTIAL
Basophils Absolute: 0.1 10*3/uL (ref 0.0–0.1)
Basophils Relative: 1 % (ref 0–1)
Eosinophils Absolute: 0.1 10*3/uL (ref 0.0–0.7)
Eosinophils Relative: 2 % (ref 0–5)
Lymphocytes Relative: 23 % (ref 12–46)
Lymphs Abs: 1.4 10*3/uL (ref 0.7–4.0)
Monocytes Absolute: 0.6 10*3/uL (ref 0.1–1.0)
Monocytes Relative: 10 % (ref 3–12)
Neutro Abs: 3.9 10*3/uL (ref 1.7–7.7)
Neutrophils Relative %: 64 % (ref 43–77)

## 2010-05-30 LAB — APTT: aPTT: 30 seconds (ref 24–37)

## 2010-06-06 ENCOUNTER — Telehealth: Payer: Self-pay | Admitting: Cardiovascular Disease

## 2010-06-06 NOTE — Telephone Encounter (Signed)
Spoke with pt, he called to report a aching pain in the center of his chest since Friday last week. He states if he moves around it causes the pain, if he just sits still the pain goes away. He denies SOB or other symptoms. He walked for one mile today and felt like the pain got better when he walked. He has taken tylenol and that helps but does not take the pain completely away. He states this is a different type pain from what he has had before with his heart. He is going to cont the tylenol and if symptoms worsen or change he will go to the ER for eval. He will call back with further problems Deliah Goody

## 2010-06-23 ENCOUNTER — Inpatient Hospital Stay (HOSPITAL_COMMUNITY)
Admission: EM | Admit: 2010-06-23 | Discharge: 2010-06-24 | DRG: 287 | Disposition: A | Discharge status: Home / Self Care | Payer: Medicare Other | Attending: Internal Medicine | Admitting: Internal Medicine

## 2010-06-23 ENCOUNTER — Telehealth: Payer: Self-pay | Admitting: Cardiovascular Disease

## 2010-06-23 ENCOUNTER — Emergency Department (HOSPITAL_COMMUNITY): Payer: Medicare Other

## 2010-06-23 DIAGNOSIS — E785 Hyperlipidemia, unspecified: Secondary | ICD-10-CM | POA: Diagnosis present

## 2010-06-23 DIAGNOSIS — I2510 Atherosclerotic heart disease of native coronary artery without angina pectoris: Principal | ICD-10-CM | POA: Diagnosis present

## 2010-06-23 DIAGNOSIS — I10 Essential (primary) hypertension: Secondary | ICD-10-CM | POA: Diagnosis present

## 2010-06-23 DIAGNOSIS — R079 Chest pain, unspecified: Secondary | ICD-10-CM

## 2010-06-23 DIAGNOSIS — E1139 Type 2 diabetes mellitus with other diabetic ophthalmic complication: Secondary | ICD-10-CM | POA: Diagnosis present

## 2010-06-23 DIAGNOSIS — E11319 Type 2 diabetes mellitus with unspecified diabetic retinopathy without macular edema: Secondary | ICD-10-CM | POA: Diagnosis present

## 2010-06-23 DIAGNOSIS — Z7982 Long term (current) use of aspirin: Secondary | ICD-10-CM

## 2010-06-23 DIAGNOSIS — I2582 Chronic total occlusion of coronary artery: Secondary | ICD-10-CM | POA: Diagnosis present

## 2010-06-23 DIAGNOSIS — Z951 Presence of aortocoronary bypass graft: Secondary | ICD-10-CM

## 2010-06-23 DIAGNOSIS — Z794 Long term (current) use of insulin: Secondary | ICD-10-CM

## 2010-06-23 LAB — DIFFERENTIAL
Basophils Absolute: 0.1 10*3/uL (ref 0.0–0.1)
Basophils Relative: 1 % (ref 0–1)
Eosinophils Absolute: 0.3 10*3/uL (ref 0.0–0.7)
Eosinophils Relative: 3 % (ref 0–5)
Lymphocytes Relative: 28 % (ref 12–46)
Lymphs Abs: 3.1 10*3/uL (ref 0.7–4.0)
Monocytes Absolute: 1.1 10*3/uL — ABNORMAL HIGH (ref 0.1–1.0)
Monocytes Relative: 10 % (ref 3–12)
Neutro Abs: 6.5 10*3/uL (ref 1.7–7.7)
Neutrophils Relative %: 58 % (ref 43–77)

## 2010-06-23 LAB — CBC
HCT: 42.5 % (ref 39.0–52.0)
Hemoglobin: 15.3 g/dL (ref 13.0–17.0)
MCH: 32.1 pg (ref 26.0–34.0)
MCHC: 36 g/dL (ref 30.0–36.0)
MCV: 89.1 fL (ref 78.0–100.0)
Platelets: 318 10*3/uL (ref 150–400)
RBC: 4.77 MIL/uL (ref 4.22–5.81)
RDW: 12.9 % (ref 11.5–15.5)
WBC: 11.1 10*3/uL — ABNORMAL HIGH (ref 4.0–10.5)

## 2010-06-23 LAB — TROPONIN I: Troponin I: <0.3 ng/mL (ref <0.30)

## 2010-06-23 LAB — COMPREHENSIVE METABOLIC PANEL
ALT: 31 U/L (ref 0–53)
AST: 31 U/L (ref 0–37)
Albumin: 4 g/dL (ref 3.5–5.2)
Alkaline Phosphatase: 71 U/L (ref 39–117)
BUN: 24 mg/dL — ABNORMAL HIGH (ref 6–23)
CO2: 26 mEq/L (ref 19–32)
Calcium: 9.8 mg/dL (ref 8.4–10.5)
Chloride: 102 mEq/L (ref 96–112)
Creatinine, Ser: 0.82 mg/dL (ref 0.4–1.5)
GFR calc Af Amer: >60 mL/min (ref >60)
GFR calc non Af Amer: >60 mL/min (ref >60)
Glucose, Bld: 129 mg/dL — ABNORMAL HIGH (ref 70–99)
Potassium: 3.8 mEq/L (ref 3.5–5.1)
Sodium: 139 mEq/L (ref 135–145)
Total Bilirubin: 0.2 mg/dL — ABNORMAL LOW (ref 0.3–1.2)
Total Protein: 7.8 g/dL (ref 6.0–8.3)

## 2010-06-23 LAB — GLUCOSE, CAPILLARY: Glucose-Capillary: 170 mg/dL — ABNORMAL HIGH (ref 70–99)

## 2010-06-23 LAB — POCT CARDIAC MARKERS
CKMB, poc: 2.8 ng/mL (ref 1.0–8.0)
Myoglobin, poc: 69 ng/mL (ref 12–200)
Troponin i, poc: <0.05 ng/mL (ref 0.00–0.09)

## 2010-06-23 LAB — CK TOTAL AND CKMB (NOT AT ARMC)
CK, MB: 3.2 ng/mL (ref 0.3–4.0)
Relative Index: INVALID (ref 0.0–2.5)
Total CK: 98 U/L (ref 7–232)

## 2010-06-23 LAB — PROTIME-INR
INR: 0.97 (ref 0.00–1.49)
Prothrombin Time: 13.1 seconds (ref 11.6–15.2)

## 2010-06-23 NOTE — Telephone Encounter (Signed)
Pt was in [place] and was having severe chest & back pain called wife he is on his way home now I told wife pt needs to go to ER but she wants to know if he can come in the office for a ekg first

## 2010-06-23 NOTE — Telephone Encounter (Signed)
Patient having CP radiating to his chest, jaw, and down his arm. His pain was around an 8/10 but is now a 5/10. He is SOB and is stomach is upset. Advised patient to call 911 now since he lives 30-45 min away from [facility]. He refuses to do this and would rather have his wife drive him even though I went over the risk involved with waiting to get to the hospital. I have notified Trish.

## 2010-06-24 DIAGNOSIS — I2510 Atherosclerotic heart disease of native coronary artery without angina pectoris: Secondary | ICD-10-CM

## 2010-06-24 LAB — CBC
HCT: 39.2 % (ref 39.0–52.0)
Hemoglobin: 13.6 g/dL (ref 13.0–17.0)
MCH: 31.2 pg (ref 26.0–34.0)
MCHC: 34.7 g/dL (ref 30.0–36.0)
MCV: 89.9 fL (ref 78.0–100.0)
Platelets: 290 10*3/uL (ref 150–400)
RBC: 4.36 MIL/uL (ref 4.22–5.81)
RDW: 12.9 % (ref 11.5–15.5)
WBC: 11.1 10*3/uL — ABNORMAL HIGH (ref 4.0–10.5)

## 2010-06-24 LAB — GLUCOSE, CAPILLARY
Glucose-Capillary: 294 mg/dL — ABNORMAL HIGH (ref 70–99)
Glucose-Capillary: 224 mg/dL — ABNORMAL HIGH (ref 70–99)
Glucose-Capillary: 184 mg/dL — ABNORMAL HIGH (ref 70–99)
Glucose-Capillary: 161 mg/dL — ABNORMAL HIGH (ref 70–99)

## 2010-06-24 LAB — BASIC METABOLIC PANEL
BUN: 22 mg/dL (ref 6–23)
CO2: 25 mEq/L (ref 19–32)
Calcium: 9.5 mg/dL (ref 8.4–10.5)
Chloride: 102 mEq/L (ref 96–112)
Creatinine, Ser: 0.87 mg/dL (ref 0.4–1.5)
GFR calc Af Amer: >60 mL/min (ref >60)
GFR calc non Af Amer: >60 mL/min (ref >60)
Glucose, Bld: 329 mg/dL — ABNORMAL HIGH (ref 70–99)
Potassium: 4.2 mEq/L (ref 3.5–5.1)
Sodium: 137 mEq/L (ref 135–145)

## 2010-06-28 NOTE — Op Note (Signed)
Ricardo Le, Ricardo Le              ACCOUNT NO.:  [id number]   MEDICAL RECORD NO.:  [id number]          PATIENT TYPE:  INP   LOCATION:  2304                         FACILITY:  [facility]   PHYSICIAN:  Salvatore Decent. Cornelius Moras, M.D. DATE OF BIRTH:  [birth year]   DATE OF PROCEDURE:  05/14/2007  DATE OF DISCHARGE:                               OPERATIVE REPORT   PREOPERATIVE DIAGNOSIS:  Severe three vessel coronary artery disease.   POSTOPERATIVE DIAGNOSIS:  Severe three vessel coronary artery disease.   PROCEDURE:  Median sternotomy for coronary artery bypass grafting x4  (left internal mammary artery to distal left anterior descending  coronary artery, saphenous vein graft to first circumflex marginal  branch with sequential saphenous vein graft to second circumflex  marginal branch, saphenous vein graft to posterior descending coronary  artery, endoscopic saphenous vein harvest from right thigh and right  lower leg).   SURGEON:  Salvatore Decent. Cornelius Moras, M.D.   ASSISTANT:  Sheliah Plane, M.D.   SECOND ASSISTANT:  Coral Ceo, P.A.-C.   ANESTHESIA:  General.   BRIEF CLINICAL NOTE:  The patient is a 70 year old gentleman with known  history of coronary artery disease, hypertension, type 2 diabetes  mellitus, and hyperlipidemia.  The patient presents with a six month  history of progressive symptoms of angina pectoris.  Cardiac  catheterization demonstrates severe three vessel coronary artery disease  with normal left ventricular function.  A full consultation note has  been dictated previously.  The patient and his wife have been counseled  at length regarding the indications, risks, and potential benefits of  surgery.  Alternative treatment strategies have been discussed.  They  understand and accept all associated risks and desire to proceed with  surgery as described.   OPERATIVE FINDINGS:  1. Normal left ventricular systolic function  2. Good quality left internal mammary artery conduit  for grafting.  3. Fair to good quality saphenous vein conduit for grafting.  4. Fair to good quality target vessels for grafting.   OPERATIVE NOTE IN DETAIL:  The patient was brought to the operating room  on the above mentioned date and central monitoring was established by  the anesthesia service under the care and direction Dr. Arta Bruce.  Specifically, a Swan-Ganz catheter is placed through the right internal  jugular approach.  A radial arterial line is placed.  Intravenous  antibiotics were administered.  Following induction with general  endotracheal anesthesia, a Foley catheter is placed.  The patient's  chest, abdomen, both groins, and both lower extremities are prepared and  draped in a sterile manner.  Baseline transesophageal echocardiogram was  performed by Dr. Michelle Piper.  This demonstrates normal left ventricular  systolic function.  There is trace to mild mitral regurgitation.  No  other abnormalities are noted.   A median sternotomy incision is performed and the left internal mammary  artery is dissected from the chest wall and prepared for bypass  grafting.  The left internal mammary artery is a good quality conduit.  Simultaneously, a saphenous vein was obtained from the patient's right  thigh and the upper portion of the right lower  leg using endoscopic vein  harvest technique.  The majority of the saphenous vein was a good  quality conduit, although portions of it are somewhat small caliber.  After the saphenous vein had been removed, the small incision in the  right thigh is closed in multiple layers with running absorbable suture.  The patient is heparinized systemically.  The left internal mammary  artery is transected distally and noted to have excellent flow.   The pericardium was opened.  The ascending aorta is normal in  appearance.  The ascending aorta and the right atrium were cannulated  for cardiopulmonary bypass.  Adequate heparinization is verified.   Cardiopulmonary bypass is begun and the surface of the heart inspected.  Distal target vessels were selected for coronary bypass grafting.  A  temperature probe is placed in the left ventricular septum and a  cardioplegic catheter is placed in the ascending aorta.  The patient is  allowed to cool passively to 32 degrees systemic temperature.  The  aortic crossclamp was applied and cold blood cardioplegia is delivered  initially in an antegrade fashion through the aortic root.  Iced saline  slush was applied for topical hypothermia.  The initial cardioplegic  arrest and myocardial cooling is felt to be excellent.  Repeat doses of  cardioplegia were administered intermittently throughout the crossclamp  portion of the operation through the aortic root and down the  subsequently placed vein grafts to maintain left ventricular septal  temperature below 15 degrees centigrade.   The following distal coronary anastomoses were performed:   1. The posterior descending coronary artery is grafted with a      saphenous vein graft in an end-to-side fashion.  This vessel was      diffusely diseased.  It measures 1.3 mm in diameter and is a fair      quality target vessel for grafting.  2. The first circumflex marginal branch is grafted with a saphenous      vein graft in a side-to-side fashion.  This vessel is chronically      occluded proximally.  It measures 1.2 mm in diameter and is a fair      quality target vessel for grafting.  3. The second circumflex marginal branch is grafted using a sequential      saphenous vein graft off the vein placed to the first circumflex      marginal branch.  This vessel measured 1.5 mm in diameter and is a      fair to good quality target vessel for grafting.  4. The distal left anterior descending coronary artery is grafted with      the left internal mammary artery in an end-to-side fashion.  This      vessel measured 1.6 mm in diameter and is a fair to good  quality      target vessel for grafting.   Both proximal saphenous vein anastomoses were performed directly to the  ascending aorta prior to removal of the aortic crossclamp.  The left  ventricular septal temperature rises rapidly with reperfusion of the  left internal mammary artery.  The aortic crossclamp was removed after a  total crossclamp time of 86 minutes.  The heart began to beat  spontaneously without need for cardioversion.  All proximal and distal  coronary anastomoses are inspected for hemostasis and appropriate graft  orientation.  Epicardial pacing wires were affixed to the right  ventricular outflow tract and to the right atrial appendage.  The  patient is  rewarmed to 37 degrees centigrade temperature.  The patient  was weaned from cardiopulmonary bypass without difficulty.  The  patient's rhythm at separation from bypass is normal sinus rhythm.  Atrial pacing was employed to increase heart rate.  No inotropic support  is required.  Total cardiopulmonary bypass time for the operation is 99  minutes.  Follow up transesophageal echocardiogram performed by Dr.  Michelle Piper after separation from bypass demonstrates normal left ventricular  function.   The venous and arterial cannulae are removed uneventfully.  Protamine  was administered to reverse the anticoagulation.  The mediastinum and  left chest are irrigated with saline solution containing vancomycin.  Meticulous surgical hemostasis is ascertained.  The mediastinum and left  chest are drained with three chest tubes exited through separate stab  incisions inferiorly.  The pericardium and soft tissues anterior to the  aorta are reapproximated loosely.  The sternum was closed with double  strength sternal wire.  The soft tissues anterior to the sternum are  closed in multiple layers and the skin was closed with running  subcuticular skin closure.   The patient tolerated the procedure well and was transported to the   surgical intensive care unit in stable condition.  There are no  intraoperative complications.  All sponge, instrument and needle counts  were verified correct at the completion of the operation.  No blood  products were administered.      Salvatore Decent. Cornelius Moras, M.D.  Electronically Signed     CHO/MEDQ  D:  05/14/2007  T:  05/14/2007  Job:  [id number]   cc:   [name], MD, [facility]  Evelena Peat, M.D.  Reather Littler, M.D.

## 2010-06-28 NOTE — Op Note (Signed)
[name], [name]              ACCOUNT NO.:  [id number]   MEDICAL RECORD NO.:  [id number]          PATIENT TYPE:  OIB   LOCATION:  1415                         [facility]ACILITY[facility]  [facility]   PHYSICIAN:  Sigmund I. Patsi Sears, M.D.DATE OF BIRTH:  09-Oct-1940   DATE OF PROCEDURE:  11/22/2007  DATE OF DISCHARGE:                               OPERATIVE REPORT   PREOPERATIVE DIAGNOSES:  Right lower pole renal calculi x2.   POSTOPERATIVE DIAGNOSES:  Right lower pole renal calculi x2.   OPERATION:  Percutaneous nephrolithotomy.   SURGEON:  Sigmund I. Patsi Sears, M.D.   RADIOLOGIST:  Dr. Miles Costain.   PREPARATION:  After appropriate preanesthesia, the patient was brought  to the operating room and placed on the operating room dorsal supine  position where general endotracheal anesthesia was induced.  He was then  replaced in the prone position and the right flank, previously marked,  was prepped with Betadine solution, and draped in the usual fashion.   REVIEW OF HISTORY:  [name] is a 70 year old male, who presented to  the office on November 02, 2007, after spontaneously passing a right  distal ureteral calculus, but continued to have right renal colic.  CT  showed two stones in the right lower pole of his kidney.  The patient  has a history of lithotripsy on the right side, with difficulty post  lithotripsy with large fragments of stone trapped in the ureter  requiring basket extraction and double J stenting.  The patient desired  to have percutaneous nephrolithotomy instead of lithotripsy.   The patient underwent right percutaneous approach in radiology, with  access gained by Dr. Miles Costain.  He presents now for right percutaneous  nephrolithotomy.   DESCRIPTION OF PROCEDURE:  The right percutaneous access site was  enlarged to approximately 1-1-1/2 cm.  Subcutaneous soft tissue was  dissected.  Balloon dilator was then placed, and access was obtained per  Dr. Miles Costain.  Following access, the 24  scope was passed into the right  renal pelvis, and using suction, irrigation and dilation of the renal  pelvis, two right lower pole renal pelvic stones were identified, and  removed.  One stone was removed in total without fractionation, the  second stone required fractionation with the Lithoclast instrument.  Following this, the stone and stone fragments were removed, and clot was  removed from the renal pelvis.  No other stones were identified.  X-ray  revealed normal renal pelvis.  It was elected to place a Council  catheter over the wire into the renal pelvis.  I elected not to place a  double-J stent, and will plan to leave a percutaneous nephrostomy tube  in place over  the weekend, and have an antegrade nephrostogram on Monday with removal  if possible.  The patient tolerated the procedure well with minimal  bleeding.  He was given IV Toradol, awakened and taken to the recovery  room in good condition.      Sigmund I. Patsi Sears, M.D.  Electronically Signed     SIT/MEDQ  D:  11/22/2007  T:  11/23/2007  Job:  [id number]

## 2010-06-28 NOTE — Cardiovascular Report (Signed)
NAMEJASKARN, SCHWEER              ACCOUNT NO.:  [id number]   MEDICAL RECORD NO.:  [id number]          PATIENT TYPE:  OIB   LOCATION:  1961                         FACILITY:  [facility]   PHYSICIAN:  Peter C. Eden Emms, MD, FACCDATE OF BIRTH:  1940/04/11   DATE OF PROCEDURE:  05/13/2007  DATE OF DISCHARGE:                            CARDIAC CATHETERIZATION   INDICATIONS:  A 70 year old diabetic with recurring chest pain.  He had  a low-risk Myoview study recently.  However, due to persistent symptoms,  catheterization was recommended.   Cine catheterization was done with 4-French catheters from right femoral  artery.   Left main coronary artery had a 50% distal stenosis.   Left anterior descending artery had 80% multiple discrete lesions in the  proximal and mid vessel.  Distal vessel had 40% multiple discrete  lesions.  First and second diagonal branches were somewhat small vessels  with 60-70% ostial disease.   Circumflex coronary artery was nondominant but appeared large.  Proximal  vessel had 30-40 multiple discrete lesions.  The distal circ had to 60-  70%  multiple discrete lesions.   There are significant collaterals to the right coronary artery from the  distal circ.   Right coronary artery was 100% occluded proximally.  This was an old  finding and the patient has good left-to-right collaterals that fill the  entire RCA up to the proximal portion.   RAO ventriculography was normal with EF of 60%.  There is no gradient  across the aortic valve and no MR.  Left ventricular  pressure was  140/18.  Aortic pressure was 150/60.   Injection of the left internal mammary artery showed it to be a good  vessel for bypass.   The patient has had a previous dye reaction.  He was premedicated with  prednisone, Benadryl and Pepcid.  He had no dye reaction during the  case.   IMPRESSION:  The patient will be admitted to the hospital.  He is a  diabetic with increasing anginal symptoms  and three-vessel disease. In  particular he has multiple lesions in the left anterior descending.   I think his best option would be coronary bypass surgery.      [name], MD, [facility]  Electronically Signed    PCN/MEDQ  D:  05/13/2007  T:  05/13/2007  Job:  [id number]

## 2010-06-28 NOTE — Discharge Summary (Signed)
Ricardo, Le              ACCOUNT NO.:  [id number]   MEDICAL RECORD NO.:  [id number]          PATIENT TYPE:  INP   LOCATION:  2015                         FACILITY:  [facility]   PHYSICIAN:  Salvatore Decent. Cornelius Moras, M.D. DATE OF BIRTH:  1940-05-25   DATE OF ADMISSION:  05/13/2007  DATE OF DISCHARGE:  05/18/2007                               DISCHARGE SUMMARY   ADMITTING DIAGNOSES:  1. Multivessel coronary artery disease.  2. History of hypertension.  3. History of diabetes mellitus type 2.  4. History of hyperlipidemia.   DISCHARGE DIAGNOSES:  1. Multivessel coronary artery disease.  2. History of hypertension.  3. History of diabetes mellitus type 2.  4. History of hyperlipidemia.   PROCEDURE:  CABG x4 (LIMA to LAD, SVG to first circumflex marginal  branch with sequential SVG to second circumflex marginal branch and  saphenous vein to posterior descending coronary artery with endoscopic  vein harvest right lower extremity by Dr. Cornelius Moras on May 14, 2007).   BRIEF HOSPITAL COURSE/STAY:  This is a 70 year old male with a known  history of coronary artery disease.  He presented with a 81-month history  of progressive symptoms of angina pectoris.  A cardiac catheterization  was performed that demonstrated severe three-vessel coronary artery  disease with preserved left ventricular function.  As previously stated,  the patient underwent a CABG x4 on May 14, 2007.  On postop day #1,  which is May 15, 2007, the patient had some complaints of lower back  pain.  He was afebrile.  Telemetry was normal sinus rhythm.  Blood  pressure was 138/60.  Cardiac index was 2.4 to 2.7.  O2 saturation was  94 to 97% on room air.   On physical exam, his lungs were clear to auscultation bilaterally.  Again, he maintained normal sinus rhythm.  His chest tube output was  elevated.  H&H was 7.9 and 31 with platelet count of 123,000, potassium  was 3.9, BUN and creatinine 11 and 0.9 on this date.  Chest  x-ray showed  mild bibasilar atelectases.  Chest tubes were removed.  Followup chest x-  ray showed no pneumothorax.  There was some elevation of the right  hemidiaphragm.  Air noted in the transverse colon.  No infiltrate or  pleural effusion.  No pulmonary edema.  On postop day #2, which is May 16, 2007, the patient had complaints of being nauseous and had emesis x1.  He was afebrile.  Blood pressure was 125/76.  H&H 10.1 and 29  respectively.  White count 8500, platelet count 109,000, and potassium  is 3.6.  He continued to be diuresed.  His potassium was supplemented on  this date.  On postop day 3#, which is May 17, 2007, the patient was  feeling a little better.  He still had occasional nauseousness; however,  no further emesis.  He was afebrile.  His vitals were stable.  He was  still slightly above his preop weight.  CBGs 132/234.  The patient  continued to improve.  On postop day #4, which is May 18, 2007, he has  been passing flatus,  but his only complaint is not having a bowel  movement yet.  He was given Dulcolax suppository and prune juice this  morning.  He is afebrile.  His vital signs are stable.  His O2  saturation is 95% on room air.  His H&H is 9.6 and 22.7 respectively,  white count under 9500 and platelet count up to 147,000.  Potassium is  3.7.  BUN and creatinine are 21 and 1.04 respectively.  He did have 5  beats of V-TACH early yesterday morning but has been normal sinus rhythm  since then.   On physical exam, pulmonary exam, his lungs are clear to auscultation  bilaterally.  No rales, wheeze, or rhonchi.  Abdomen is soft and  nontender.  Sporadic bowel sounds.  Extremities no edema.  Wounds clean  and dry.  He is going to also be given lactulose 30 mL to assess with  the BM and will most likely be discharged later today.  In addition, he  will be given Lasix 40 mg p.o. with potassium supplementation, and he  has already been supplemented with 20 of KCl  earlier.   DISCHARGE INSTRUCTIONS:  Include the following; no driving, no lifting  more than 10 pounds until instructed, otherwise he is to continue with  his breathing exercise everyday.  He is to walk everyday.  He is to  contact cardiac and thoracic surgeon's office to arrange for a followup  appointment in 3 weeks to see Dr. Cornelius Moras.  A chest x-ray will be obtained  30 minutes prior to this appointment.  In addition, he needs to call to  make a followup appointment with Dr. Eden Emms in 2 weeks.   DISCHARGE MEDICATIONS:  Include the following:  1. EC-ASA 325 mg p.o. daily.  2. Lopressor 25 mg p.o. twice daily.  3. Lisinopril 20 mg p.o. daily.  4. Zocor 40 mg p.o. at bedtime.  5. Glucotrol XL 10 mg p.o. daily.  6. Ultram 50 mg 1-2 tablets p.o. q.4-6 h. p.r.n. pain.      Doree Fudge, PA      Beverly H. Cornelius Moras, M.D.  Electronically Signed    DZ/MEDQ  D:  05/18/2007  T:  05/19/2007  Job:  [id number]   cc:   [name], MD, [facility]

## 2010-06-28 NOTE — Assessment & Plan Note (Signed)
[facility]                            CARDIOLOGY OFFICE NOTE   [name], [name]                     MRN:          [id number]  DATE:05/09/2007                            DOB:          [birth year]    Ricardo Le returns today for follow-up.  He has had previously totally  occluded right coronary with collaterals by catheterization in 1995.  He  has been having recurrent chest pain.  He has diabetes, hypertension,  and hypercholesterolemia, all on therapy.  He had a stress Myoview study  done February 21, 2007 which had mild apical thinning and no scar or  ischemia at 61%.  However, despite this, he has had progressive pain.  Now, it is exertional and radiating down his left arm.  I told Ricardo Le I  was concerned about this and that he needed a heart catheterization.  He  tended to agree.   His review of systems is remarkable for some numbness in his left face  still.  He has been started on a statin drug without problems, otherwise  negative.  He has not had rest pain or prolonged episodes of pain.   His current medications include:  1. Metformin 1 g in the morning and 500 at night.  2. Norvasc 5 a day.  3. Glipizide 10 a day.  4. Lisinopril hydrochlorothiazide 20/25.   We need to do was a pause 2144.  His exam is remarkable for a weight of  161, blood pressure 120/70, pulse 72 and regular, afebrile.  HEENT:  Unremarkable.  Carotids normal without bruit.  No lymphadenopathy, thyromegaly, or JVP  elevation.  LUNGS:  Clear.  Good diaphragmatic motion.  No wheezing.  S1-S2, normal heart sounds.  PMI normal.  ABDOMEN:  Benign.  Bowel sounds positive.  No hepatosplenomegaly or  hepatojugular reflux.  Distal pulses are intact.  No edema.  NEUROLOGICAL:  Nonfocal.  SKIN:  Warm and dry.  No muscular weakness.   His EKG shows sinus rhythm with old MI, poor R wave progression.   IMPRESSION:  1. Recurrent chest pain despite normal Myoview.  He has had a     previously occluded right that was undiagnosed prior to      catheterization.  Continue aspirin.  Consider adding beta blocker      and stopping Norvasc.  Heart catheterization next week.  2. Diabetes.  Continue metformin.  Hold two days prior to      catheterization.  Hemoglobin A1c quarterly.  3. Hypertension.  Norvasc to beta blocker.  Continue lisinopril HCTZ.  4. Hypercholesterolemia.  Continue statin drug.  Lipid and liver      profile in 6 months.  As I      recall, he just started a statin drug a few weeks ago.  Further      recommendations will be based on results of his heart      catheterization.     Ricardo Le. Ricardo Emms, MD, [facility]  Electronically Signed    PCN/MedQ  DD: 05/09/2007  DT: 05/10/2007  Job #: [id number]

## 2010-06-28 NOTE — Assessment & Plan Note (Signed)
[facility]                                 ON-CALL NOTE   Ricardo, Le                     MRN:          [id number]  DATE:06/18/2009                            DOB:          1940-08-05    Ricardo Le was seen earlier today in the emergency room for obstipation  and constipation.  He underwent a gastrografin enema for the purpose of  evacuating his stool.  He claims this was only partially successful.  He  has what sounds like impacted stool.  He inquired whether he could use  mag citrate which was successful for him in the past.  I told him that  he could.  If he is unsuccessful, he will call back.     Barbette Hair. Arlyce Dice, MD,FACG     RDK/MedQ  DD: 06/18/2009  DT: 06/19/2009  Job #: [id number]   cc:   Hedwig Morton. Juanda Chance, MD

## 2010-06-28 NOTE — Assessment & Plan Note (Signed)
[facility]                            CARDIOLOGY OFFICE NOTE   Ricardo Le, Ricardo Le                     MRN:          [id number]  DATE:06/10/2007                            DOB:          09-Feb-1941    Ricardo Le returns today in follow-up.  He just had CABG.  He is diabetic  with three-vessel disease.  He did well.  He had no periop  complications.  His medications are a bit confusing.  He was previously  on lisinopril HCTZ, but was discharged on dose of lisinopril.  His wife  seems to think he needs a diuretic.  The patient wanted to start mowing  his lawn.  He also wanted to start walking on a regular basis and plant  a garden.   I discussed with him some of the limitations in regards to his  sternotomy.   He has been compliant with his meds.  His sugars have been under good  control.  He is tolerating simvastatin for his cholesterol.   REVIEW OF SYSTEMS:  Otherwise, negative.   CURRENT MEDICATIONS:  1. Metformin 1 g in the morning and 500 at night.  2. Glipizide 10 mg a day.  3. Lisinopril HCTZ 20/12.5.  4. Metoprolol 25 b.i.d.  5. Simvastatin 40 a day.  6. An aspirin a day.   PHYSICAL EXAMINATION:  VITAL SIGNS:  Remarkable for blood pressure  120/70, pulse 72 and regular, weight 161, afebrile, respiratory rate 14.  HEENT:  Unremarkable.  Carotids normal without bruit, no  lymphadenopathy, thyromegaly, JVP elevation.  LUNGS:  Clear, good diaphragmatic motion.  No wheezing.  Sternum is well-  healed.  S1, S2 with normal heart sounds PMI normal.  ABDOMEN:  Benign.  Bowel sounds positive, no AAA no tenderness, no  bruit, no hepatosplenomegaly or jugular reflux.  EXTREMITIES:  Distal pulses are intact, no edema.  NEUROLOGICAL:  Nonfocal.  SKIN:  Warm and dry.  MUSCULOSKELETAL:  No muscular weakness.   IMPRESSION:  1. Stable three-vessel disease status post CABG, good left ventricle      function.  Continue aspirin and beta blocker.  2.  Hypercholesterolemia in the setting of coronary disease.  Continue      simvastatin 40 a day, lipid and liver profile in six months.  3. Hypertension.  Resume lisinopril HCTZ.  4. Diabetes.  Continue metformin, hemoglobin A1c quarterly.   Ricardo Le is doing well.  I will see him back in three months.     [name], MD, [facility]  Electronically Signed    PCN/MedQ  DD: 06/10/2007  DT: 06/10/2007  Job #: [id number]

## 2010-06-28 NOTE — Consult Note (Signed)
Ricardo Le, Ricardo Le              ACCOUNT NO.:  [id number]   MEDICAL RECORD NO.:  [id number]          PATIENT TYPE:  OIB   LOCATION:  1961                         FACILITY:  [facility]   PHYSICIAN:  Peter C. Eden Emms, MD, FACCDATE OF BIRTH:  1940/06/28   DATE OF CONSULTATION:  05/13/2007  DATE OF DISCHARGE:                                 CONSULTATION   Mr. Gumina is a 70 year old diabetic with hypercholesterolemia and  hypertension.  He has known coronary artery disease.  Back in 1995, he  was found to have a totally occluded right coronary artery with good  collaterals.  He has been treated medically.  He has been having some  increasing chest pain.  He had a low-risk Myoview study about a month  ago, however, his symptoms have persisted.  He clearly describes  exertional angina.  The pain is progressive.  It has been getting  somewhat worse over the last 3-4 weeks.  He has not had any prolonged  rest episodes.   I told Jakota, given his known coronary disease and exertional chest and  back pain which sounds anginal, that he should have a heart  catheterization.   His heart catheterization was performed today in the [facility] lab.  It showed  severe three-vessel disease, particularly with multiple high-grade  lesions in the LAD with continued collateralization of the right.  He  has good LV function.   He is being admitted to the hospital for unstable angina.  I have  personally spoken to Dr. Tressie Stalker who may be able to operate on him  tomorrow.   His heart catheterization previously had been complicated by a moderate  dye reaction.  He was premedicated with Pepcid, prednisone, and  Benadryl.  He had no dye reaction during the case.   His past medical history is remarkable for:  1. Type 2 diabetes.  2. Hypertension.  3. Previous kidney stones.   He is allergic to IV CONTRAST DYE.   He has had previous knee surgery, previous back surgery, recent cataract  removal.   FAMILY  HISTORY:  Is remarkable for premature coronary artery disease on  both mother and father's side.   MEDICATIONS INCLUDE:  1. Metformin 1 gram in the morning 500 mg at night.  2. Norvasc 5 a day.  3. Glipizide 10 a day.  4. Lisinopril/HCTZ 20/25.   He is allergic to CONTRAST DYE.   The patient is retired.  His wife is also a patient of mine.  He does  not drink or smoke.  He is very active and, indeed, it was his chest  pain with frequent ambulation and walking that caused him to have his  symptoms.   EXAM:  Is remarkable for blood pressure 130/70, pulse 80 and regular,  afebrile, respiratory rate 14, weight is 161.  HEENT:  Unremarkable.  Carotids clear without bruit.  No  lymphadenopathy, thyromegaly, or JVP elevation.  LUNGS:  Are clear, good diaphragmatic motion, no wheezing, S1-S2, normal  heart sounds.  PMI normal.  ABDOMEN:  Is benign.  Bowel sounds positive.  No hepatosplenomegaly or  hepatojugular  reflux.  No bruit.  No AAA.  Distal pulses are intact.  No  edema.  NEURO:  Nonfocal.  Skin warm and dry.  No muscular weakness.   His lab work is pending from his outpatient cath.   IMPRESSION:  1. Unstable angina, three-vessel disease, with new lesions in the      distal circumflex and multiple lesions in the left anterior      descending.  Admit patient.  Heparin therapy to be started after      groin heels.  Dr. Barry Dienes consulted for coronary artery bypass graft.      Stop Norvasc, change to beta blocker.  2. Diabetes.  Glucophage held due to recent cath.  Cover with sliding      scale insulin.  Check hemoglobin A1c.  3. Hypercholesterolemia.  Switch to high-dose statin, either Lipitor      80 or simvastatin.  4. Hypertension, currently well controlled.  Again, Norvasc to be      switched out for beta blocker given increase in coronary disease      and need for heart rate control.  5. Recent cataract surgery.  Vision good.  6. Vascular disease.  Will have pre-coronary  artery bypass graft      Dopplers to assess lower extremity circulation and rule out carotid      disease to decrease risk of stroke prior to coronary artery bypass      graft.   The diagnosis of three-vessel coronary disease was gone over in detail  with the patient, his wife and daughter.  He understands the need for  hospitalization and probable CABG in the morning.      [name], MD, [facility]  Electronically Signed     PCN/MEDQ  D:  05/13/2007  T:  05/13/2007  Job:  [id number]

## 2010-06-28 NOTE — Assessment & Plan Note (Signed)
[facility]                            CARDIOLOGY OFFICE NOTE   AAYUSH, GELPI                     MRN:          [id number]  DATE:09/09/2007                            DOB:          [birth year]    Josean returns today for followup.  He is status post CABG.  He had had  some palpitations and did not like being on beta-blockers.  He seems to  be much improved now that we have him switched over to Norvasc.  His  event monitor showed only normal sinus rhythm with no evidence of  arrhythmias.  He did not have periop atrial fibrillation.  He has  otherwise been doing well.  He had 1 episode of chest pain a few weeks  ago when he was getting ready for church, it sounded musculoskeletal.  It was self-limited and has not returned.   The patient is otherwise doing well.  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   His review of system is otherwise negative.   His current weight is 158, blood pressure is 130/79, pulse 18 and  regular, respiratory rate 14, and afebrile.  HEENT unremarkable.  Carotids normal without bruit.  No lymphadenopathy, thyromegaly, or JVP  elevation.  Lungs are clear, good diaphragmatic motion.  No wheezing.  S1 and S2 with S4 gallop.  PMI normal.  Abdomen is benign.  Bowel sounds  positive.  No AAA.  No tenderness.  No bruit.  No hepatosplenomegaly or  hepatojugular reflux.  Distal pulses are intact.  No edema.  Neuro  nonfocal.  Skin warm and dry.  No muscular weakness.   IMPRESSION:  1. Stable 3-vessel disease with recent coronary artery bypass      grafting, good left ventricular function.  Continue aspirin      therapy.  No beta-blocker due to side effects.  2. Hypertension, currently well controlled.  Continue low-salt diet      and Norvasc.  3. Hyperlipidemia in the setting of coronary artery disease, continue      simvastatin.  Lipid and liver profile in 6 months.  4.  Diabetes.  Hemoglobin A1c last checked 5.7.  Continue glipizide and      metformin.  5. Palpitations, benign, likely side effect of beta-blockers.  No      significant arrhythmias on event monitoring.  Continue to watch      clinically.  I will see him back in 6 months.     [name], MD, [facility]  Electronically Signed    PCN/MedQ  DD: 09/09/2007  DT: 09/10/2007  Job #: [id number]

## 2010-06-28 NOTE — Consult Note (Signed)
NAMETIMONTHY, HOVATER              ACCOUNT NO.:  [id number]   MEDICAL RECORD NO.:  [id number]          PATIENT TYPE:  OIB   LOCATION:  1960                         FACILITY:  [facility]   PHYSICIAN:  Salvatore Decent. Cornelius Moras, M.D. DATE OF BIRTH:  [birth year]   DATE OF CONSULTATION:  DATE OF DISCHARGE:  05/13/2007                                 CONSULTATION   PRIMARY CARE PHYSICIAN:  Dr. Evelena Peat.   REASON FOR CONSULTATION:  Severe three-vessel coronary artery disease.   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  The patient apparently underwent cardiac  catheterization approximately 15 years ago and was found to have chronic  occlusion of the right coronary artery with normal left ventricular  function.  He has been treated medically.  He has been followed  carefully by Dr. Eden Emms.  Mr. Shirk returns recently with a wall 6-  month history of progressive symptoms of angina pectoris.  He had a  stress Myoview exam performed February 21, 2007 that showed only mild  apical thinning with no clinical signs or symptoms of ischemia.  However, the patient has had continued progression of symptoms  suspicious for angina pectoris.  He was seen in followup by Dr. Eden Emms  and subsequently scheduled for elective cardiac catheterization.  Over  the last couple weeks, the patient has experienced accelerating symptoms  of chest pain radiating to his left arm that have now begun occurring at  rest when he is trying to sleep at night.  He underwent cardiac  catheterization today by Dr. Eden Emms.  He was found to have severe three-  vessel coronary artery disease with preserved left ventricular function.  Mr. Matuszak has been admitted to the hospital for subsequent treatment  and surgical consultation has been requested.   REVIEW OF  SYSTEMS:  General:  The patient reports stable appetite.  He  has not been gaining or losing weight substantially.  Cardiac:  Notable  for a 49-month or more history of progressive symptoms of angina  pectoris, now with intermittent episodes of mild chest pain occurring  occasionally at rest, particularly at night when he is sleeping.  His  chest pain is described as a burning chest pain that has started  inferiorly and in his back, that radiates up across his left chest into  the left arm.  This is typically brought on with physical activity and  relieved by rest.  The patient has had some exertional shortness of  breath that is new.  The patient denies resting shortness of breath,  PND, orthopnea, or lower extremity edema.  He has had some mild  dizziness associated with episodes of chest pain but he denies any  syncopal episodes.  Respiratory:  Negative.  The patient denies  productive cough, hemoptysis, wheezing.  Gastrointestinal:  Negative.  The patient has no difficulty swallowing.  He denies hematochezia,  hematemesis, melena.  Musculoskeletal:  Notable for some chronic low  back pain.  Neurologic:  Notable  for some chronic numbness involving the  left side of his face and apparently has been present for several  months.  The patient has seen a neurologist and underwent an MRI that  apparently showed no sign of previous stroke.  The patient denies any  transient monocular blindness or transient numbness or weakness  involving either upper or lower extremity.  Genitourinary:  Notable for  some history of kidney stones including an episode approximately 3 weeks  ago.  Endocrine:  Notable for longstanding type 2 diabetes mellitus that  has been under good control.  The patient checks his blood sugars  regularly.  HEENT:  Negative.  Psychiatric:  Negative.   PAST MEDICAL HISTORY:  1. Coronary artery disease.  2. Type 2 diabetes mellitus.  3. Hypertension.  4. Hyperlipidemia.  5.  Diabetic retinopathy.  6. Kidney stones.  7. Degenerative disk disease of the lumbosacral spine.   PAST SURGICAL HISTORY:  1. Laparoscopic cholecystectomy.  2. Lumbar laminectomy and diskectomy.  3. Excision of the lumbar cysts.  4. Appendectomy.  5. Right knee arthroscopy.  6. Cystoscopy with ureteral stents implantation.   FAMILY HISTORY:  Noncontributory.   SOCIAL HISTORY:  The patient is a retired Optician, dispensing who lives with his  wife in [place].  He is a nonsmoker.  He denies alcohol consumption.  He  remains active physically and walks every day with his wife.   MEDICATIONS PRIOR TO ADMISSION:  1. Metformin 1000 mg every morning and 500 mg every evening.  2. Norvasc 5 mg daily.  3. Glipizide 10 mg daily.  4. Lisinopril/hydrochlorothiazide 20/25 one tablet daily.   DRUG ALLERGIES:  IV CONTRAST.   PHYSICAL EXAM:  The patient is a well-appearing male who appears his  stated age in no acute distress.  He is currently normal sinus rhythm.  He is normotensive.  HEENT:  Exam is grossly unremarkable.  The neck is supple.  There is no cervical nor supraclavicular  lymphadenopathy.  There is no jugular venous distention.  No carotid  bruits are noted.  Auscultation of the chest demonstrates clear breath sounds which are  symmetrical anteriorly.  No wheezes or rhonchi noted.  CARDIOVASCULAR:  Exam includes regular rate and rhythm.  No murmurs,  rubs or gallops are noted.  The abdomen is soft, nontender.  There are no palpable masses.  Bowel  sounds present.  EXTREMITIES:  Warm and well-perfused.  There are no lower extremity  edema.  Distal pulses are palpable in the posterior tibial position.  The skin is clean, dry healthy-appearing throughout.  There is no venous  insufficiency.  Rectal is deferred.  NEUROLOGIC:  Examination grossly nonfocal symmetrical throughout.   DIAGNOSTIC TESTS:  Cardiac catheterization performed by Dr. Eden Emms is  reviewed.  This reveals severe  three-vessel coronary artery disease with  preserved left ventricular function.  Specifically, there is 50%  stenosis of the distal left main coronary artery.  There is long segment  90% stenosis of the mid left anterior descending coronary artery with  90% stenosis of the distal left anterior descending coronary artery.  There is 50-60% stenosis of mid-left circumflex coronary with subtotal  occlusion of a medium-sized first circumflex marginal branch.  There is  90% stenosis of the distal left circumflex coronary artery after takeoff  of the second circumflex marginal branch.  There is 100% chronic  occlusion of the right coronary artery with left-to-right collateral  filling of posterior descending coronary artery.  Left ventricular  function appears preserved with  no significant wall motion  abnormalities.   IMPRESSION:  Severe three-vessel coronary artery disease with  accelerating symptoms of unstable angina pectoris and preserved left  ventricular function.  I believe that Mr. Pfenning would best be treated  with surgical revascularization.   PLAN:  I have discussed options at length with Mr. Cremer and his  family.  Alternative treatment strategies have been reviewed.  They  understand and accept all associated risks of surgery including but not  limited to risk of death, MI, stroke, CHF, repiratory failure,  pneumonia, bleeding requiring transfusion, arrhythmia, infection and  recurrent CAD.  We plan OR tomorrow.  All questions answered.      Salvatore Decent. Cornelius Moras, M.D.  Electronically Signed     CHO/MEDQ  D:  05/13/2007  T:  05/13/2007  Job:  [id number]   cc:   [name], MD, [facility]  Evelena Peat, M.D.

## 2010-06-28 NOTE — Assessment & Plan Note (Signed)
[facility]                            CARDIOLOGY OFFICE NOTE   Ricardo Le, Ricardo Le                     MRN:          [id number]  DATE:01/30/2007                            DOB:          [birth year]    Ricardo Le returns today for followup.  He has coronary artery disease  with a previously occluded RCA with collaterals and no significant left  disease.  He had a Myoview in November of 2007, which was thought to be  low risk with a mild inferior wall ischemia.  I suspect that this was  from his collaterals.   The patient has not had any significant chest pain, PND, or orthopnea.   Coronary risk factors include diabetes, hypercholesterolemia, and  hypertension.  He tells me his hemoglobin A1c is 5.8, which was  excellent.  The last time I saw him, I gave him a script for Simvastatin  to start taking.  He apparently thought his cholesterol was well enough  and did not take it.  I explained to him that he had significant  vascular disease with an occluded right coronary artery, he was noted to  have bilateral carotid bruits today, and he is a diabetic.  I told him  regardless of what his LDL cholesterol is, he should be on a statin drug  and gave him another prescription for Simvastatin, and we will try to  get more updated lipid levels from Dr. Caryl Never.   The patient is active, he walks on a regular basis, he is not having any  significant chest pain or claudication, he has had no PND or orthopnea,  and no syncope.   He has been compliant with his meds.   REVIEW OF SYSTEMS:  Remarkable for some atypical left shoulder pain,  which sounds more musculoskeletal.  He has had some numbness on the left  side of his face.  He does have a history of Bell's palsy and shingles  and may be having a recurrent episode, and I told him we would try to  get him in to see Neurology to further assess this, but he particularly  may be at risk for a central fifth  nerve problem given his diabetes.   CURRENT MEDICATIONS:  1. Metformin 1 g in the morning and 500 at night.  2. Norvasc 5 a day.  3. Glipizide 10 a day.  4. Lisinopril/HCTZ 20/25.  5. He is not on a beta blocker.  His resting heart rates tend to run      60 to 70.   PHYSICAL EXAMINATION:  GENERAL:  An elderly white male in no distress.  VITAL SIGNS:  Weight is 163, blood pressure is 140/70, pulse is 68 and  regular, respiratory rate 16, and afebrile.  HEENT:  Unremarkable.  NECK:  He has bilateral carotid bruits.  No lymphadenopathy,  thyromegaly, or JVP elevation.  LUNGS:  Clear with good diaphragmatic motion and no wheezing.  HEART:  S1 and S2, normal heart sounds, PMI normal.  ABDOMEN:  Benign, bowel sounds positive, no hepatosplenomegaly or  hepatojugular reflux.  EXTREMITIES:  Distal pulses are  intact, no edema.  NEUROLOGIC:  There is no obvious facial droop.  SKIN:  There is no obvious rash or evidence of shingles outbreak.   IMPRESSION:  1. Coronary disease, total right coronary artery with collaterals.      Followup Myoview and continue aspirin therapy, not on a beta      blocker due to relatively low heart rate.  2. Hypertension currently well controlled.  Continue a low salt diet      and Lisinopril/HCTZ as well as Norvasc.  3. Lipid status.  Check to see if Dr. Caryl Never has recent records,      start Simvastatin 40 a day, lipid and liver profile in three      months.  4. Bilateral carotid bruits.  Check carotid duplex.  Rule out      significant disease.  No evidence of transient ischemic attack.      Continue aspirin therapy.  5. Question regarding Bell's palsy or shingles on the left side of the      face.  I will try to get him in to see Neurology in the next week      or two.  I doubt this represents a sensory stroke.  His blood      pressure has been okay.  I will let them make further decisions      regarding the need for a computerized tomography scan.   I  will see Ricardo Le back in six months so long as his carotid duplex and  stress test are low risk.   His EKG today was sinus rhythm with left axis deviation and poor R-wave  progression.     [name], MD, [facility]  Electronically Signed    PCN/MedQ  DD: 01/30/2007  DT: 01/30/2007  Job #: [id number]

## 2010-06-28 NOTE — Assessment & Plan Note (Signed)
[facility]                            CARDIOLOGY OFFICE NOTE   Ricardo Le, Ricardo Le                     MRN:          [id number]  DATE:12/26/2007                            DOB:          Jul 03, 1940    Ricardo Le is seen today in follow up.  He is a diabetic with  hypertension and hypercholesterolemia with the coronary artery disease.   He has had previous bypass surgery.  He is not having any significant  chest pain.  His last Myoview was on February 21, 2007.  At that time, he  had apical thinning with no signs of scar or ischemia.  His EF was 61.  Ricardo Le has otherwise been doing well.  He has been compliant with his  meds.  I do not have the recent hemoglobin A1c on him.  His blood  pressure has been well controlled.   He does have an IV CONTRAST DYE reaction.   He has previously been intolerant to SIMVASTATIN.   In talking to [facility], he has had multiple issues in the past.  He had  some renal problems requiring what sounds like a nephrostomy tube, just  seems to have settled out.  He also has chronic back problems and has  seen Dr. Otelia Sergeant about 2 weeks ago.  He describes what sounds to me like a  new left footdrop.  The patient has very poor strength in his left foot  now.  He said it happened fairly suddenly.  It is associated with  increased back pain.  He was concerned about his circulation.  Ricardo Le  has had previous carotid duplex study, but no evidence of PVD.  His  symptoms are more neurological and related to his back and not related  to claudication.   His current medications include:  1. Glipizide 10 mg a day.  2. An aspirin a day.  3. Metformin 500 two tabs b.i.d.  4. Amlodipine 5 a day.  5. Lisinopril/hydrochlorothiazide 20/12.5.   PHYSICAL EXAMINATION:  GENERAL:  Remarkable for an elderly white male in  no distress.  VITAL SIGNS:  His weight is 158, blood pressure 114/68, pulse 84 and  regular, respiratory rate 14,  afebrile.  HEENT:  Unremarkable.  NECK:  He does not have any carotid bruits on exam today.  No  lymphadenopathy, thyromegaly, or JVP elevation.  LUNGS:  Clear.  Good diaphragmatic motion.  No wheezing.  HEART:  S1 and S2 with a soft systolic murmur.  PMI normal.  ABDOMEN:  Benign.  Bowel sounds positive.  No AAA.  No bruit.  No  hepatosplenomegaly or hepatojugular reflux or tenderness.  EXTREMITIES:  Distal pulses are intact.  PTs are +3 bilaterally.  There  is no evidence of bruits or claudication.  He has decreased abduction in  the left foot.  NEUROLOGIC:  Otherwise, nonfocal.   IMPRESSION:  1. Coronary disease, previous coronary artery bypass graft, currently      stable.  No evidence of ischemia on recent Myoview.  Continue risk      factor modification and aspirin.  2. Footdrop on the left side,  fairly acute onset 2 weeks ago.  We      called Dr. Barbaraann Faster office today to expedite his appointment in the      setting of a chronic lower back problem.  I suspect he will need a      repeat MRI, but this is clearly not claudication.  3. Previous history of carotid bruits.  His carotid duplex from      January 2009 showed 0-39% bilateral disease.  No need for followup      for another year.  Continue baby aspirin.  4. Diabetes.  Followup primary care MD, hemoglobin A1c quarterly.      Continue oral hypoglycemic.  5. Hypertension, currently well controlled.  Continue current dose of      lisinopril/hydrochlorothiazide and amlodipine.   The patient previously had fatigue on beta-blocker and this was stopped.   Overall, I think Ricardo Le's heart is stable, but he needs further workup  from Dr. Otelia Sergeant.     [name], MD, [facility]  Electronically Signed    PCN/MedQ  DD: 12/26/2007  DT: 12/26/2007  Job #: [id number]

## 2010-06-28 NOTE — Op Note (Signed)
NAMEKALIQ, LEGE              ACCOUNT NO.:  [id number]   MEDICAL RECORD NO.:  [id number]          PATIENT TYPE:  INP   LOCATION:  5040                         FACILITY:  [facility]   PHYSICIAN:  Kerrin Champagne, M.D.   DATE OF BIRTH:  1940-12-13   DATE OF PROCEDURE:  02/20/2008  DATE OF DISCHARGE:                               OPERATIVE REPORT   PREOPERATIVE DIAGNOSIS:  Lumbar spinal stenosis, L2-3, L3-4, and L4-5  with minimal listhesis at L2-3.   POSTOPERATIVE DIAGNOSIS:  Lumbar spinal stenosis, L2-3, L3-4, and L4-5  with minimal listhesis at L2-3 with HNP, right side L2-3.   PROCEDURE:  Central laminectomy at L2-3, L3-4, L4-5 with bilateral L2,  bilateral L3, bilateral L4, and bilateral L5 nerve root decompression.  Redo laminectomy was performed at the L4-5 level and a microdiskectomy  on the right side at L2-3.  Microscope was used during this procedure.   SURGEON:  Kerrin Champagne, MD   ASSISTANT:  Wende Neighbors, PA-C   ANESTHESIA:  General via orotracheal intubation, Dr. Laverle Hobby.   FINDINGS:  Severe central stenosis at L2-3, L3-4, and L4-5; severe  biforaminal entrapment at each level L2, L3, L4, and L5.   ESTIMATED BLOOD LOSS:  150-200 mL.   COMPLICATIONS:  None.   DRAINS:  Hemovac x1, Foley to straight drain.   The patient returned to the PACU in good condition.   HISTORY OF PRESENT ILLNESS:  The patient is a 70 year old male who has  undergone previous diskectomy at the L4-5 level, then underwent excision  of the synovial cyst at the same level on the opposite site.  He has had  persistent, severe pain, difficulty standing and ambulation.  Pain in  the both lower extremities, weakness in foot, dorsiflexion noted.  He  had a history of cardiac disease.  Dr. Eden Emms has followed him and felt  to be stable for procedure.  Attempts of conservative management  including ASIS flexion exercise program, all unsuccessful.  He is  brought to the operating room to  undergo central decompressive  laminectomy for a severe lumbar spinal stenosis at L3-4, L4-5, and L2-3  with foraminal stenosis at L2, L3, L4, and L5 levels.   INTRAOPERATIVE FINDINGS:  As above.   DESCRIPTION OF PROCEDURE:  After adequate general anesthesia, this  patient had standard preoperative antibiotics.  He was placed in the  supine position and a Wilson frame was used, and all pressure points  were padded.  PAS stockings to both lower extremities to prevent DVT.  The patient did have standard preoperative antibiotics and marking of  the correct site for surgery on his back preoperatively in the preop  holding area.  Intraoperative time-out was carried out identifying the  patient, procedure to be performed and any concerns regarding the  procedure.  Participitants involved.  Following prep with DuraPrep  solution, he was draped in the usual manner, iodine dye drape was used.  Incision over the midline standing from about L3-L4 and L5 through the  skin and subcutaneous layers after infiltration with Marcaine 0.5% with  1:200,000 epinephrine.  Incision  then made on both sides of the spinous  process of L2, L3, L4, and L5.  Intraoperative radiograph obtained with  clamps on the spinous process of L2 and L4.  These were marked for  continued identification.  Posterior aspect of the lamina of L2, L3, L4,  and L5 were carefully exposed using Cobb elevators, electrocautery.  These were followed out to and over the facets at the L2-3, L3-4, and L4-  5 levels.  McCullough retractor was inserted and a cerebellar retractor  used at the inferior aspect of the incision.  Leksell rongeur was then  used to remove a small portion of the inferior aspect of the spinous  process of L1, the entire spinous process of L2, the entire spinous  process of L3 and of L4.  A small portion of the superior aspect of the  spinous process of L5 was similarly resected at the midline.  Leksell  rongeur was then  used carefully thin the base of the spinous process in  the lamina.  Bleeding was controlled using monopolar and bipolar  electrocautery.  With this, a high-speed bur then used carefully thin  the posterior elements to the L1-2 intravertebral disk space superiorly,  thinning the lamina centrally and preserving the pars at each level.  At  least 6-8 mm of pars was preserved in each segment.  With this, then an  osteotomy was performed along the medial aspect of the facet at the L2-3  level, removing approximately 15% of the facet on each side, inferior  articular process of L2 and superior articular process of L3.  Then, at  the L3-4 levels, similarly resecting the medial aspect of the facet both  sides about 15% and then at L4-5, this area shows scar, which was  significant and ran an  osteotomy here.  This would be done later after  scar tissue was approached and decompression carried out from cranial-  caudal.  A 3-mm Kerrison was then used to resect the remaining portions  of the central lamina at the L2, L3, and L4 levels, first at L2 and L3,  and resecting ligamentum flavum at the L1-2 level, L2-3 level, and L3-4  level.  This was done using Leksell rongeur lifting with pushings.  Then  using Kerrisons debrided the ligamentum flavum both medial aspects of  the facets at both sides.  With decompression centrally, then the  ligamentum flavum at the L1-2 level was resected from medial aspect of  the facet at the L1-2 level went up to the ventral surface to the  insertion over the ventral aspect of the inferior portion of the L1  lamina.   Following this, then attention was turned to the L4-5 level where  central laminectomy was carried out from cranial-caudal resecting  central portions of the lamina and then continuing this out laterally.  Osteotomes were then used to resect small portion of the medial aspect  of the facet at the L4-5 level.  The superior articular process of L5  was  found to be still present within the lateral recess, which is  causing severe lateral recess stenosis, and these were resected using a  quarter and half-inch osteotomes back to the level of the medial aspect  of the pedicle at this level.  Significant amount of scar tissue and  synovial tissue surrounding the right thecal sac at the L4-5 level and  overlying the right L5 nerve root as it entered the neuroforamen.  Foraminotomy was performed over both  L5 nerve roots with resection of  about 20% of the superior aspect of the neural arch at L5 in order to  decompress the spinal canal here and perform adequate foraminotomy over  L5 nerve roots.  There was hypertrophic changes involving the lamina and  the entry point into the neuroforamen on both sides.  The lateral  recesses were decompressed to the level of the medial pedicle at both  sides.  Next, neuroforamen at the L4 nerve root levels were decompressed  with resection of portion of the superior articular process of L5 both  sides decompressing the neuroforamen at L4, resecting the reflected  portions of the ligamentum flavum.  Hockey stick neuro probe was then  able to be passed out at the L4 and L5 neuroforamen bilaterally.  Operating room microscope was draped and brought into the field.  Under  the microscope, most of the particulars regarding decompression of the  L5 neuroforamen was carried out on the right side in particular.  Each  neuroforamen was evaluated.  Hockey stick neuro probe passed over the  neuroforamen, nerve root, and ligamentum flavum then resected over the  hockey stick neuro probe in order to decompress each foramen.  There was  severe stenosis present at the L2-3 level in the right side disk  protrusion and felt to be present that was causing compression at the L3  nerve root as it exited at this level.  The L3 nerve root was carefully  freed up using the Penfield 4 retractor medially.  Disk herniation  present  was then resected using a 15-blade scalpel and we then incised  the disk vertically.  A nerve hook was then used to tease degenerative  disk material from the posterior aspect of the disk space on the side  decompressing the lateral recess further, freeing up the L2 and L3 nerve  root as it exited.  With this, then a hockey stick neuro probe could be  passed out at the L2 neuroforamen in the reflective portions of the  ligamentum flavum at the L2-3.  Facet joint was then resected,  decompressing the L2 nerve root on the right.  A L3 nerve root was  similarly decompressed using microscope.  Attention then turned to the  opposite side on the left side and again changing sites was able from  the right side than the left side, it was able to be evaluated.  Each of  the neuroforamen were then examined using microscope.  Each were well  decompressed using 2- and 3-mm Kerrison resecting reflected portions of  the ligamentum flavum, the axillary nerve roots, neuroforamen of both  L2, L3, L4, and L5.  This completed decompression.  The central portions  of the thecal sac showed nice pulsatile impulse with CFF pressure.  There is no evidence of leak present.  Irrigation was carried out.  Bone  wax was applied to the bleeding cancellous bone surface.  A Gelfoam was  placed within the lateral recesses, where bleeding was present.  The  other areas of Gelfoam was removed.  Immediately, Hemovac drain was  placed in the depth of the incision exiting over the left side, placed  over the right side up to the L1 level.  The muscle layers were then  approximated loosely with interrupted #1 Vicryl sutures.  Lumbodorsal  fascia reapproximated in the midline after careful trimming of the  superior aspect of the L5 spinous process and the inferior aspect of the  L1 spinous process to  smooth these areas in the region of laminotomy.  Next, a lumbodorsal fascia was reapproximated with interrupted #1 Vicryl  suture.   Deep subcu layer was reapproximated with interrupted #1 and 0  Vicryl sutures, more superficial layers with interrupted with 2-0 Vicryl  suture, and skin closed with a running subcu stitch of 4-0 Vicryl.  Dermabond was then applied, 4 x 4s, fixed to the skin with Hypafix tape  and ABD applied.  The patient was then returned to the supine position,  reactivated, extubated, and returned to recovery room in satisfactory  condition.  All instruments and sponge counts were correct.      Kerrin Champagne, M.D.  Electronically Signed     JEN/MEDQ  D:  02/20/2008  T:  02/21/2008  Job:  [id number]

## 2010-06-28 NOTE — Assessment & Plan Note (Signed)
OFFICE VISIT   ABEER, IVERSEN  DOB:  1940-12-16                                        June 03, 2007  CHART #:  [id number]   HISTORY OF PRESENT ILLNESS:  Mr. Temme returns for routine follow-up,  status post coronary artery bypass grafting x4 on May 14, 2007.  His  initial postoperative recovery was uncomplicated.  Following hospital  discharge, Mr. Chalfin has continued to improve uneventfully.  He was  seen in the [facility] cardiology office last week and he returns to our  office for a routine follow-up today.  Overall, Mr. Bracco reports  doing quite well.  He states that the soreness in his chest has  continued to slowly improve.  He has not had any shortness of breath.  He has not had any exertional chest discomfort.  He has not had any  dizzy spells.  His appetite is improving.  His physical activity has  improved quite well.  He did develop sudden change in visual acuity in  the right eye earlier today, which he states is similar to events he has  had previously with problems with retinal hemorrhages related to his  underlying longstanding diabetes.  He states that he is confident this  is similar to one of these episodes and he plans to contact his  ophthalmologist, Dr. Ashley Royalty as soon as possible.  He otherwise has no  complaints and reports getting along quite well.  His medications remain  unchanged from the time of hospital discharge.   PHYSICAL EXAMINATION:  GENERAL:  Notable for a well-appearing male.  VITAL SIGNS:  Blood pressure 157/84, pulse 68, oxygen saturation 96% on  room air.  CHEST:  Examination of the chest demonstrates the median sternotomy  incision is healing nicely.  The sternum is stable on palpation.  LUNGS:  Breath sounds are clear to auscultation and symmetrical  bilaterally.  No wheezes or rhonchi are demonstrated.  CARDIOVASCULAR:  Includes regular rate and rhythm.  No murmurs, rubs or  gallops noted.  ABDOMEN:   Soft, nontender.  There are no palpable masses.  EXTREMITIES:  Warm and well-perfused.  There is no lower extremity  edema.  The small incisions from endoscopic vein harvest are healing  nicely.  The remainder of his physical exam is unremarkable.   DIAGNOSTIC TESTS:  Chest x-ray obtained on May 30, 2007, is reviewed.  This demonstrates clear lung fields bilaterally.  There is trivial  residual pleural effusion on either side.  All the sternal wires appear  intact.  No other abnormalities are noted.   IMPRESSION:  Satisfactory progress on recent coronary artery bypass  grafting.  Overall, Mr. Fedewa appears to be doing quite well.  He did  develop sudden onset of decreased visual acuity in his right eye earlier  today, which he states is similar to episodes he has had previously with  retinal hemorrhages.   PLAN:  I have instructed Mr. Tramell to go ahead and contact Dr.  Ashley Royalty' office as soon as possible.  I think he could come off of  aspirin if he needs to temporarily, although ultimately he obviously  will benefit from long-term antiplatelet therapy.  I have otherwise  encouraged him to continue to increase his physical activity as  tolerated with his only limitation at this point remaining that he  refrain  from heavy lifting or strenuous use of his arms or shoulders for  at least another 6-8 weeks.  I have also instructed Mr. Helderman to go  ahead and restart taking Norvasc as he had been taking prior to surgery  for hypertension.  All his questions have been addressed.  He will start  the cardiac rehab program as soon as feasible.  He will call and return  to see [place] here at [facility] only should further  problems or difficulties arise.   Salvatore Decent. Cornelius Moras, M.D.  Electronically Signed   CHO/MEDQ  D:  06/03/2007  T:  06/03/2007  Job:  [id number]   cc:   [name], MD, [facility]  Evelena Peat, M.D.  Beulah Gandy. Ashley Royalty, M.D.

## 2010-06-28 NOTE — Op Note (Signed)
Ricardo Le, MARSALA              ACCOUNT NO.:  [id number]   MEDICAL RECORD NO.:  [id number]          PATIENT TYPE:  OIB   LOCATION:  5007                         FACILITY:  [facility]   PHYSICIAN:  Kerrin Champagne, M.D.   DATE OF BIRTH:  October 16, 1940   DATE OF PROCEDURE:  06/16/2008  DATE OF DISCHARGE:                               OPERATIVE REPORT   PREOPERATIVE DIAGNOSIS:  Right L3-4 herniated nucleus pulposus.   POSTOPERATIVE DIAGNOSES:  Right L3-4 herniated nucleus pulposus.  The  herniated nucleus pulposus at the site of previous central laminectomy.   PROCEDURE:  Right L3-4 microdiskectomy, redo type of procedure.   SURGEON:  Kerrin Champagne, MD.   ASSISTANT:  Skip Mayer, PA-C.   ANESTHESIA:  General via orotracheal intubation by Dr. Sondra Come.   ESTIMATED BLOOD LOSS:  Less than 50 mL.   DRAINS:  None.   BRIEF CLINICAL HISTORY:  The patient is a 70 year old male with diabetes  mellitus.  He has had a history of severe neurogenic claudication with  severe lumbar spinal stenosis.  Cardiac history:  He underwent attempts  at conservative management and epidural steroids, exercise program, and  eventually underwent central laminectomy in L2-3, L3-4, and L4-5 for  spinal stenosis in January of this  year.  Postoperatively, his pain  changed sides, became primarily right-sided L4 radicular pain, anterior  thigh, anterior shin to the top of his foot.  He initially showed signs  primarily of spinal stenosis, minimal protrusion of disk at 3-4.  The  follow up MRI scan showed a worsening of soft disk protrusion into the  lateral recess on the right sided L3-4 affecting primarily the L4 nerve  root.  After attempts at further steroid injections, which only  temporized his discomfort, he was brought to the operating room to  undergo microdiskectomy right L3-4.   INTRAOPERATIVE FINDINGS:  HNP right L3-4, retrolisthesis with L2 on L4  with right-sided L3 nerve root entrapment.  Disk  herniation over the  posterior aspect of the right side L3-4 disk space affecting the right  L4 nerve root epidural scar.   DESCRIPTION OF PROCEDURE:  After adequate general anesthesia, the  patient rolled to prone position, Wilson frame reversed Skytron bed was  used.  All pressure points were well padded, PAS stockings.  Standard  preoperative antibiotics of Ancef.  The patient in the preop holding  area had right side marked L3-4 with x, the expected side of the  surgery.  Landmarks were identified.  Standard prep with DuraPrep  solution.  Draped in the usual manner.  Shaving once again using  clippers over the posterior aspect of the spine, lumbar dorsal area.  It  was draped in the usual manner.  Iodine Vi-drape was used.  Two spinal  needles were placed at the expected L3, L4 area.  Intraoperative C-arm  fluoro demonstrated the lower needle opposite to the disk space at the  L3-4 level.  The upper needle at the upper aspect of L3 vertebral body.  The upper needle was removed.  The previous midline incision was then  infiltrated on the  superior and inferior aspect of the expected level of  the needle by a centimeter to centimeter and half.  The incision about  the anterior inch and a half in length to the skin and subcutaneous  layers down to the lumbodorsal fascia.  A self-retaining retractor  placed.  Incision done with electrocautery carried in the midline and  then Cobb elevator used to retract the paraspinous muscles laterally  down to the level of the lateral mass of L3-4.  The remaining portions  of the facet along the right side of L3-4.  Soft tissue was then  carefully stripped off posterior aspect of the facet medially using a  curette.  The thecal sac was freed up off the medial aspect of the facet  using a Penfield 4.  The Penfield 4 left in place.  Intraoperative  lateral radiograph demonstrated Penfield 4 posterior to the L3-4 disk  and the expected level of our  microdiskectomy.  Boss McCulloch retractor  was then inserted.  The smallest thin blade was inserted with a post  medially.  Operating microscope brought in to the field and a mark made  over the medial aspect of the facet where the previous  Penfield 4 had  been for continued identification throughout the case at the L3-4 disk  space level.  Under the operating room microscope, then the thecal sac  was carefully freed off the medial aspect at L4-5 facet.  Synovial cyst  noted over the inferior aspect of the L3-4 cyst.  This was resected  using a 2-3 mm Kerrison.  The L4 nerve root was then carefully freed off  medial aspect from the L4 pedicle and retracted medially.  A disk space  L3-4 identified retrolisthesis of L3 on L4 noted, disk herniation  material noted ventral to the thecal sac on the right side just above  the L4 pedicle.  Disk material was removed using micropituitary,  microscope in place.  The posterior aspect of the disk space was then  debrided with a loose particulate disk matter using micropituitary  further freed up using a medium Epstein curette.  Disk space itself  could not be entered due to tightness of the posterior aspect of disk  space L3-4, so the Penfield 4 could not be placed into the disk space  but seemed to fit between the posterior aspect of the end plates but not  into the disk space itself.  A 2-mm Kerrison was used to further debride  loose posterior longitudinal ligaments and annular material.  It was  free within the lateral recess on the right side within the neuroforamen  right L3 nerve root.  This was freed up then a bulbous tip nerve hook  was then able to be passed at both the L4 and L3 neural foramen  indicating their patencies.  Thrombin-soaked Gelfoam was used to obtain  hemostasis and removed.  Soft tissues were allowed to fall back into  place after irrigation, and following this, then the lumbodorsal fascia  was approximated midline with  interrupted 0 Vicryl sutures.  Deep subcu  layers approximated with interrupted #0 Vicryl sutures, more subcu  layers with interrupted 2-0 Vicryl sutures and skin closed with a  running subcu stitch of 4-0 Vicryl.  Dermabond was then applied.  A 4 x  4's fixed to the skin with Tegaderm.  Note that the skin subcu areas  were infiltrated with Marcaine 0.5% with 1:200,000 epinephrine, 6 mL at  the beginning of the case.  At the end of the case, the patient was then  returned to supine position, reactivated and extubated and returned to  recovery room in satisfactory condition.  All instruments, sponge counts  were correct.  Note that standard preop and intraoperatively time-out  was carried out, identifying the patient, procedure performed, the site  of the procedure, participants, any concerns.      Kerrin Champagne, M.D.  Electronically Signed     JEN/MEDQ  D:  06/16/2008  T:  06/17/2008  Job:  [id number]

## 2010-07-01 ENCOUNTER — Encounter: Payer: Self-pay | Admitting: Cardiovascular Disease

## 2010-07-01 NOTE — Assessment & Plan Note (Signed)
[facility]                              CARDIOLOGY OFFICE NOTE   MAJID, MCCRAVY                     MRN:          [id number]  DATE:12/22/2005                            DOB:          10/16/1940    Ms. Dadamo is referred by Dr. Caryl Never who last saw him in 2004, I  believe.  I take care of his wife.  He has known coronary artery disease  with a total right total coronary artery with collaterals.  He did not have  critical disease in his left system.  He is a diabetic with  hypercholesterolemia.   He has had a rough year this year.  He has had multiple kidney stone  procedures including a ureteral stent and he had acute cholecystitis which  required surgery and had him quite sick for 2-3 weeks.  He has not had any  significant chest pain, PND, or orthopnea.  He is back to walking quite a  bit.  He has not had a stress test since 2004.   REVIEW OF SYSTEMS:  The patient's review of systems is otherwise remarkable  for a hiatal hernia and some reflux.  His diabetes has been well controlled  with a hemoglobin A1c of 6.   PAST SURGICAL HISTORY INCLUDES:  Previous back surgery, knee surgery,  appendectomy, recent gallbladder surgery and kidney stones.   He is a retired Optician, dispensing.  He does watch his diet as indicated.  His wife  was with him today and she is a patient of mine.  She has lost 40 pounds and  is doing much better.   FAMILY HISTORY:  Remarkable for a heart attack on the father's side at age  7.  His medications include metformin 500 twice a day, lisinopril HCTZ  20/12.5, amlodipine 5 mg a day, Glipizide 10 twice a day.   ALLERGIES:  He is allergic to CATH LAB DYE.   PHYSICAL EXAMINATION:  HEENT:  Normal.  Blood pressure is 130/70, pulse is 70 and regular.  There is no  lymphadenopathy.  There is no thyromegaly.  He has a left carotid bruit and  also needs a carotid duplex for a left bruit.  LUNGS:  Clear.  There is normal  S1-S2, normal heart sounds.  ABDOMEN:  Benign.  He is status post laparoscopic cholecystectomy.  He has  no abdominal tenderness.  Distal pulses are intact with no edema.  NEUROLOGIC:  Nonfocal.  Skin: Warm and dry   His EKG shows a sinus rhythm with a question of a previous inferior wall MI  with Q's in 2, 3, and F.   IMPRESSION:  Stable known coronary artery disease in a diabetic.  He needs a  followup stress Myoview.  The patient's LV function has been normal in the  past.  He is on an ACE inhibitor given the fact that he is a diabetic, I  suspect that he should be on a statin drug.  We will try to get his most  recent lipid profile from Dr. Doristine Counter. I gave him a generic prescription for  Zocor in lieu  of the fact that I am sure his LDL cholesterol is too high.  We will do a carotid duplex in regards to his left bruit.   I am glad that the patient was re-referred to [place].  He needs closer  surveillance and better risk factor modification in a diabetic with known  coronary artery disease.     [name], MD, [facility]  Electronically Signed    PCN/MedQ  DD: 12/22/2005  DT: 12/22/2005  Job #: [id number]

## 2010-07-01 NOTE — Op Note (Signed)
Ricardo Le, Ricardo Le              ACCOUNT NO.:  [id number]   MEDICAL RECORD NO.:  [id number]          PATIENT TYPE:  OIB   LOCATION:  5707                         FACILITY:  [facility]   PHYSICIAN:  Beulah Gandy. Ashley Royalty, M.D. DATE OF BIRTH:  11/17/1940   DATE OF PROCEDURE:  02/02/2005  DATE OF DISCHARGE:  02/03/2005                                 OPERATIVE REPORT   ADMISSION DIAGNOSIS:  Proliferative diabetic retinopathy posterior vitreous  membranes, vitreous hemorrhage in the left eye.   PROCEDURES:  Pars plana vitrectomy left eye, retinal photocoagulation left  eye, membrane peel left eye.   SURGEON:  Alan Mulder, M.D.   ASSISTANT:  Rosalie Doctor, MA.   ANESTHESIA:  General.   DETAILS:  Usual prep and drape.  Trocars placed at 10, 2 and 4 o'clock. The  5 mm infusion at 4 o'clock.  The lighted pick and the cutter were placed and  10 and 2 o'clock, respectively. Provisc placed in the corneal surface. Biome  viewing system moved into place. Pars plana vitrectomy was performed just  behind the crystalline lens. The posterior capsule was trimmed. The  vitrectomy was carried posteriorly and blood with vitreous was carefully  removed under low suction and rapid cutting down to the macular surface  posterior membranes were encountered. The 25-gauge vitreous forceps were  used to engage membranes and peel them from the surface of the retina. These  were removed. Once these were loose and the vitreous cutter was repositioned  in the eye and additional vitrectomy was carried out for 360 degrees.  Scleral depression was used to gain access to the vitreous base. The base  was trimmed for 360 degrees.  Once all vitreous and blood was removed, the  endolaser was positioned in the eye and 435 burns were placed with a power  1000 milliwatts, 1000 microns each and 0.1 seconds each. A washout procedure  was performed.  Instruments were removed from the eye and the wounds were  tested and found to be  tight.  Polymyxin and gentamicin were irrigated 15  into tenon space. Atropine solution was applied. Marcaine was injected  around the globe for postop pain. Decadron 0.5 mL 5 mg was injected into the  subconjunctival space. Closing pressure was 10 with a Barraquer tonometer.  TobraDex ophthalmic ointment, patch and shield were placed. The patient was  awakened, taken to recovery in satisfactory condition.      Beulah Gandy. Ashley Royalty, M.D.  Electronically Signed     JDM/MEDQ  D:  02/02/2005  T:  02/04/2005  Job:  [id number]

## 2010-07-01 NOTE — H&P (Signed)
NAMELITTLETON, HAUB              ACCOUNT NO.:  [id number]   MEDICAL RECORD NO.:  [id number]          PATIENT TYPE:  INP   LOCATION:  1319                         FACILITY:  [facility]   PHYSICIAN:  Maisie Fus A. Cornett, M.D.DATE OF BIRTH:  November 17, 1940   DATE OF ADMISSION:  11/08/2005  DATE OF DISCHARGE:  11/09/2005                                HISTORY & PHYSICAL   CHIEF COMPLAINT:  Abdominal pain.   HISTORY OF PRESENT ILLNESS:  The patient is a 70 year old male with  epigastric pain times 1 week.  It has been severe in nature and is in the  right upper quadrant without radiation.  It is associated with nausea or  vomiting, made worse by eating.  He was sent for an ultrasound week ago  which showed gallstones and then presented to the office today for  evaluation.  He was in mild distress complaining of significant epigastric  and right upper quadrant pain with nausea and vomiting.  Food makes it worse  and so he has not eaten much over the last week.   PAST MEDICAL HISTORY:  1. Type 2 diabetes mellitus.  2. Hypertension.  3. History of multiple kidney stones.   ALLERGIES:  IV CONTRAST DYE.   PAST SURGICAL HISTORY:  1. Numerous procedures to remove kidney stones.  2. Knee surgery.  3. Back surgery.  4. Eye surgery.  5. Removal of cyst.   SOCIAL HISTORY:  Denies tobacco and alcohol use.   MEDICATIONS:  Include metformin, glipizide, Norvasc, lisinopril,  hydrochlorothiazide.   FAMILY HISTORY:  Positive for heart disease and hypertension.   REVIEW OF SYSTEMS:  A 15-point review of systems as stated above, otherwise  negative.   PHYSICAL EXAMINATION:  VITAL SIGNS:  Temperature is 97.9, pulse 83, blood  pressure 133/72.  GENERAL APPEARANCE:  White male in mild distress.  HEENT:  Extraocular movements are intact.  No scleral icterus noted.  Oropharynx clear.  NECK:  Supple, nontender, full range of motion.  No thyromegaly.  CHEST:  Clear to auscultation with normal chest wall  motion.  CARDIOVASCULAR:  Regular rate and rhythm without murmur or gallop.  ABDOMEN:  Positive right upper quadrant tenderness with rebound and guarding  and a questionable positive Murphy's sign.  No mass.  A small umbilical  hernia noted.  EXTREMITIES:  Muscle tone is normal.  Range of motion is  normal.  NEUROLOGIC:  Cranial nerves II-XII are intact.  Motor and sensory functions  are grossly intact.   DIAGNOSTIC DATA:  Ultrasound from [facility] reveals multiple small  gallstones with no obvious gallbladder wall thickening from October 31, 2005.  There is a 1-cm, right renal calculi noted on that study as well.   IMPRESSION:  Acute cholecystitis.   PLAN:  Will be sent to [facility] at this point I am to be admitted for IV  fluids, lab work and I have discussed the case with Dr. Anselm Pancoast.  Weatherly, who is on call and will help care for him.  I have discussed this  with the patient and he voices understanding and agrees to proceed.  Thomas A. Cornett, M.D.  Electronically Signed     TAC/MEDQ  D:  11/08/2005  T:  11/10/2005  Job:  [id number]

## 2010-07-01 NOTE — Op Note (Signed)
Ricardo Le, Ricardo Le              ACCOUNT NO.:  [id number]   MEDICAL RECORD NO.:  [id number]          PATIENT TYPE:  AMB   LOCATION:  DAY                          FACILITY:  [facility]   PHYSICIAN:  Sigmund I. Patsi Sears, M.D.DATE OF BIRTH:  Mar 17, 1940   DATE OF PROCEDURE:  DATE OF DISCHARGE:                                 OPERATIVE REPORT   PREOPERATIVE DIAGNOSIS:  Right post lithotripsy ureteropelvic junction stone  impaction.   POSTOPERATIVE DIAGNOSIS:  Right post lithotripsy ureteropelvic junction  stone impaction.   PROCEDURE:  Cystourethroscopy, right retrograde pyelogram, manipulate stone  in the renal pelvis, right Double-J catheter.   SURGEON:  Jethro Bolus, M.D.   ANESTHESIA:  General endotracheal.   PREPARATION:  After appropriate pre anesthesia, the patient was brought to  the operating room, and placed on the operating table in the dorsal supine  position where general endotracheal anesthesia was introduced.  (The patient  has had pain medicine all day long, and had breakfast this morning, and it  was felt that endotracheal anesthesia was better planned than LMA.)   PROCEDURE:  Review of the history shows that this 70 year old male was  status post lithotripsy, with severe pain secondary to impacted right UPJ  fragment, treated with morphine, Phenergan and Toradol this morning. The  patient is now for Double-J catheter insertion.   PROCEDURE IN DETAIL:  Cystoscopy was accomplished and a right retrograde  pyelogram was accomplished, with normal appearing ureter. The fragment is  identified at the UPJ, and manipulated back to the renal pelvis. A guidewire  was passed through the middle calyx, and 5-French x 24 cm [employer]  stent is placed in the renal pelvis and through the right ureteral orifice  into the bladder. The fluoroscope was used to ensure that the stent was  placed in the right position.  Following this, the bladder was draining  fluid and  the patient was awaken and taken to the recovery room in good  condition.      Sigmund I. Patsi Sears, M.D.  Electronically Signed     SIT/MEDQ  D:  10/11/2005  T:  10/12/2005  Job:  [id number]

## 2010-07-01 NOTE — Op Note (Signed)
Ricardo Le, Ricardo Le              ACCOUNT NO.:  [id number]   MEDICAL RECORD NO.:  [id number]          PATIENT TYPE:  INP   LOCATION:  1319                         FACILITY:  [facility]   PHYSICIAN:  Anselm Pancoast. Weatherly, M.D.DATE OF BIRTH:  [birth year]   DATE OF PROCEDURE:  11/08/2005  DATE OF DISCHARGE:  11/09/2005                                 OPERATIVE REPORT   PREOPERATIVE DIAGNOSIS:  Acute cholecystitis with stones.   POSTOPERATIVE DIAGNOSIS:  Acute cholecystitis with stones.   OPERATION:  Laparoscopic cholecystectomy with cholangiogram.   SURGEON:  Anselm Pancoast. Zachery Dakins, M.D.   ASSISTANT:  Currie Paris, M.D.   ANESTHESIA:  General anesthesia.   HISTORY:  Ricardo Le is a 70 year old who presented in the office today  and was seen by Dr. Luisa Hart with the following history:  He said he started  having pain approximately a week ago. He was seen in [facility] and referred for ultrasound at [facility]. He was not recalled  any reports, continued to have pain in the upper abdomen. He had had a  recent kidney stone. A stent was removed recently by Dr. Patsi Sears. Then,  he after checking was found to have acute cholecystitis and was told to call  to be seen in our office. He called, and they did not think it was an acute  problem, and appointment was given today to see Dr. Luisa Hart. He presented to  the office today and said he was having basically a continuous pain in the  right upper quadrant, did not have chills or fever, and Dr. Luisa Hart found  that he was definitely tender and referred him over for an emergent  cholecystectomy this evening, and I was the doctor on call. I saw the  patient the first time about 6-7:00, and he was vaguely tender in the right  upper quadrant, not really acute. White count and CMET were normal. EKG and  chest x-ray was performed, and I recommend we go ahead and proceed with  laparoscopic cholecystectomy this evening. The  patient was in agreement. He  was taken to the operative suite. He had been given __________ PAS  stockings. Induction of general anesthesia, endotracheal tube, oral tube to  the stomach. The abdomen was clipped and then prepped with Betadine solution  and draped in a sterile manner. A small incision was made below the  umbilicus. The fascia was identified, picked up between two Kochers, and  then a small opening carefully made in the underlying peritoneum, making an  opening through a [employer].  A pursestring suture of 0 Vicryl was placed, a  Hasson cannula introduced, and the right upper quadrant was inspected. The  omentum was kind of laying over the liver, and the upper 10-mm trocar was  placed under direct vision in the subxiphoid after anesthetizing the fascia,  and the two lateral 5-mm probes were placed by Dr. Jamey Ripa at the appropriate  right lateral position under direct vision after anesthetizing the fascia.  The omentum was slipped off of the gallbladder, and the gallbladder was  distended. There were some adhesions around it, but  it was not that of an  __________ acute cholecystitis. The gallbladder was retracted upward. The  adhesions were carefully taken down. There were numerous adhesions, and we  worked on them very carefully to the proximal portion of the gallbladder.  The cystic artery and cystic duct were then exposed under direct vision. Two  clips were placed on the artery and a clip on the cystic duct gallbladder  junction and then a small opening made just proximally, and Koch catheter  was introduced. There was bile that kind of squirted out as if it was under  direct pressure. The x-ray, however, showed a very small, normal-appearing,  common bile duct, a fairly long cystic duct and good flow into the duodenum.  The right and left intrahepatic radicles were visualized. The catheter was  removed. The cystic duct was triply clipped just proximal to this. There was  a  little vessel on the actual cystic duct that was accomplished with the  most proximal clip, and then the cystic artery had already been divided, and  the gallbladder was freed from its bed with hook electrocautery. I used a  spatula at the end to kind of cauterize any questionable areas of bleeding.  The gallbladder had been placed into an EndoCatch bag. We then switched the  cameras to the upper 10-mm port, grabbed the bag, brought it out through the  small incision, and the whole intact gallbladder was placed in the pathology  specimen. The nurse that was at the back table said she could not definitely  tell whether there were stones or not. I did not open it up since it was  already in the bag labeled when I asked at completion of surgery. The 5-mm  ports were withdrawn under direct vision. I placed an additional figure-of-  eight suture of 0 Vicryl in the fascia at the umbilicus and anesthetized the  fascia at the umbilicus with Marcaine with adrenaline. The carbon dioxide  was released, and we put a figure-of-eight in the fascia in the subxiphoid  area, and the subcutaneous wounds were closed with 4-0 Vicryl, benzoin and  Steri-Strips on the skin. The patient tolerated the procedure nicely and was  extubated and sent to the recovery room in stable postoperative condition.  Hopefully, the patient will be ready to be discharged in the morning, and  hopefully, this discomfort will be resolved. I will allow him to start  liquids this evening and advance him towards a select diet in the morning  and will let him resume his chronic medications.           ______________________________  Anselm Pancoast. Zachery Dakins, M.D.     WJW/MEDQ  D:  11/08/2005  T:  11/10/2005  Job:  [id number]

## 2010-07-01 NOTE — Op Note (Signed)
NAMESERAFINO, BURCIAGA              ACCOUNT NO.:  [id number]   MEDICAL RECORD NO.:  [id number]          PATIENT TYPE:  OIB   LOCATION:  3008                         FACILITY:  [facility]   PHYSICIAN:  Reinaldo Meeker, M.D. DATE OF BIRTH:  Apr 08, 1940   DATE OF PROCEDURE:  12/23/2004  DATE OF DISCHARGE:  12/23/2004                                 OPERATIVE REPORT   PREOPERATIVE DIAGNOSES:  1.  Synovial cyst, lumbar 4-5 on the left.  2.  Possible herniated nucleus pulposus.   POSTOPERATIVE DIAGNOSES:  1.  Synovial cyst, lumbar 4-5 on the left with,  2.  Herniated disk of lumbar 4-5 on the left.   OPERATION PERFORMED:  1.  Left lumbar 4-5 intralaminar laminotomy with  2.  Removal of synovial cyst, followed by,  3.  Left lumbar 4-5 microdiskectomy.  4.  Microdissection of lumbar 4-5 disk and lumbar 5 nerve root.   SURGEON:  Reinaldo Meeker, M.D.   ASSISTANT:  Donalee Citrin, M.D.   DESCRIPTION OF OPERATION:  After being placed in the prone position the  patient's back was prepped and draped in the usual sterile fashion.  Localizing x-rays were taken prior to incision to identify the appropriate  level.  A midline incision was made above the spinous process of L4 and L5.  Using the Bovie cutting current the incision was carried out to the spinous  processes.  Subperiosteal dissection was then carried out on the left side  of the processes of the lamina.  Self-retaining retractor was placed for  exposure.  X-rays showed we were at  approximately the appropriate level.   With the high-speed drill the inferior 1/2 of the L4 lamina and the medial  one-third of the __________  was removed.  It was then used to incise half  of the L5 lamina.  Residual bone and ligamentum flavum were removed in a  piecemeal fashion. A large cystic mass off the joint and also part of the  ligamentum flavum were identified, and peeled away from the laminar  elements.  This gave excellent decompression of the  thecal sac and the L5  nerve root.   At this time the microscope was draped and brought into the filed and used  throughout the remainder of the case.  Using microdissection technique the  __________ , thecal sac and the L5 nerve root were identified.  Coagulation  was carried out in the __________  canal to identify the L4, 5 disk, which  was found to be herniated laterally with distortion of the L5 nerve root.  It was elected to incise the disk and clear it out.  Using a 15 blade the  annulus of the disk was incised.  Using pituitary rongeurs and curets the  disk space clean out was carried out with great care, which was taken to  avoid injury to the neural __________ , which was successfully done.   At this point inspection was carried out in all directions for any evidence  of residual compression none could identified.  Large amounts of irrigation  were carried out.  Any bleeding  encountered was controlled with bipolar  coagulation and Gelfoam.  The wound was then closed using interrupted Vicryl  in the muscle, fascia, subcutaneous and subcuticular tissues, and staples on  the skin.  Sterile dressing was then applied.   The patient was extubated and taken to the recovery room in stable  condition.           ______________________________  Reinaldo Meeker, M.D.     ROK/MEDQ  D:  12/23/2004  T:  12/24/2004  Job:  [id number]

## 2010-07-06 ENCOUNTER — Encounter: Payer: Self-pay | Admitting: Cardiovascular Disease

## 2010-07-06 ENCOUNTER — Ambulatory Visit (INDEPENDENT_AMBULATORY_CARE_PROVIDER_SITE_OTHER): Payer: Medicare Other | Admitting: Cardiovascular Disease

## 2010-07-06 DIAGNOSIS — I2510 Atherosclerotic heart disease of native coronary artery without angina pectoris: Secondary | ICD-10-CM

## 2010-07-06 DIAGNOSIS — I6529 Occlusion and stenosis of unspecified carotid artery: Secondary | ICD-10-CM

## 2010-07-06 DIAGNOSIS — R0989 Other specified symptoms and signs involving the circulatory and respiratory systems: Secondary | ICD-10-CM

## 2010-07-06 DIAGNOSIS — I10 Essential (primary) hypertension: Secondary | ICD-10-CM

## 2010-07-06 NOTE — Assessment & Plan Note (Signed)
Cholesterol is at goal.  Continue current dose of statin and diet Rx.  No myalgias or side effects.  F/U  LFT's in 6 months. No results found for this basename: LDLCALC             

## 2010-07-06 NOTE — Assessment & Plan Note (Signed)
Stable no angina and no complications from surgery.  Continue medical Rx

## 2010-07-06 NOTE — Assessment & Plan Note (Signed)
Cannot find recent carotid duplex in system.  Will need these Continue ASA

## 2010-07-06 NOTE — Cardiovascular Report (Signed)
NAMECONNELL, BOGNAR              ACCOUNT NO.:  [id number]  MEDICAL RECORD NO.:  [id number]           PATIENT TYPE:  I  LOCATION:  2029                         FACILITY:  [facility]  PHYSICIAN:  Bevelyn Buckles. Bensimhon, MDDATE OF BIRTH:  Nov 04, 1940  DATE OF PROCEDURE:  06/24/2010 DATE OF DISCHARGE:  06/24/2010                           CARDIAC CATHETERIZATION   PRIMARY CARE FOR PROVIDER:  Evelena Peat, MD  CARDIOLOGIST:  [name], MD, [facility]  INDICATIONS:  Mr. Mangino is a 70 year old male with a history of diabetes, hypertension, and hyperlipidemia.  He has coronary artery disease status post bypass surgery in March 2009, with a LIMA to the LAD, saphenous vein graft to the PDA, and saphenous vein sequential graft to the OM-1 and OM-2.  He was admitted with chest pain.  Cardiac markers and EKG were unremarkable.  PROCEDURES PERFORMED: 1. Selective coronary angiography. 2. LIMA angiography. 3. Saphenous vein graft angiography x2. 4. Left heart cath. 5. Left ventriculogram.  DESCRIPTION OF PROCEDURE:  The risks and indication were explained. Consent was signed and placed on the chart.  A 5-French arterial sheath was placed in the right femoral artery using modified Seldinger technique.  Standard catheters were used including JL-4, JR-4, angled pigtail.  Catheter exchanges made over wire.  There were no apparent complications.  Central aortic pressure 107/57, mean of 77, LV 120/7, with EDP of 17.  There were no apparent complications.  Left main had distal 50% lesion.  LAD gave off a diagonal branch and a large septal perforator.  The LAD has a proximal 70% stenosis followed by 80% stenosis in the midsection and then was totally occluded.  There was a 95% stenosis in the ostium of the septal branch and 95% stenosis in the ostium of the diagonal. The diagonal had TIMI 1-2 flow and was totally occluded distally.  Ramus branch had an 80% proximal lesion.  Left circumflex  gave off an OM-1 and OM-2, both were occluded.  There was a 50% lesion proximally, 70-80% lesion in the mid AV groove circ, and a 40% diffuse lesion in the distal AV groove circ.  Right coronary artery was totally occluded proximally.  LIMA to the LAD was widely patent.  However, the native distal LIMA was diffusely diseased with diffuse 90-95% stenosis.  Saphenous vein sequential graft to the OM-1, OM-2 was widely patent. The OM-1 was small.  Saphenous vein graft to the PDA was patent.  There was diffuse disease in the PDA.  Left ventriculogram done in the RAO position showed an EF of 55-60% with no regional wall motion abnormalities.  ASSESSMENT: 1. Severe 3-vessel native coronary artery disease. 2. All grafts are patent. 3. Normal left ventricular function.  PLAN/DISCUSSION:  Mr. Sawatzky graft remained patent.  However, native coronary artery disease has progressed.  We will add Imdur 30 mg a day and consider cardiac rehab for a chronic stable angina.  He will be able to go home today with close followup with Dr. Eden Emms.  He will need reattempt of statin and we will put him on Crestor 10 mg a day to see if he can tolerate.  Bevelyn Buckles. Bensimhon, MD     DRB/MEDQ  D:  06/24/2010  T:  06/25/2010  Job:  [id number]  cc:   Evelena Peat, M.D. [name], MD, [facility]  Electronically Signed by Arvilla Meres MD on 07/06/2010 09:38:46 PM

## 2010-07-06 NOTE — Discharge Summary (Signed)
Ricardo Le, Ricardo Le              ACCOUNT NO.:  [id number]  MEDICAL RECORD NO.:  [id number]           PATIENT TYPE:  I  LOCATION:  2029                         FACILITY:  [facility]  PHYSICIAN:  Bevelyn Buckles. Bensimhon, MDDATE OF BIRTH:  14-Feb-1940  DATE OF ADMISSION:  06/23/2010 DATE OF DISCHARGE:  06/24/2010                              DISCHARGE SUMMARY   PRIMARY CARDIOLOGIST:  Theron Arista C. Eden Emms, MD, [facility]  PRIMARY CARE PROVIDER:  Evelena Peat, MD  DISCHARGE DIAGNOSES: 1. Chest pain/severe 3-vessel coronary artery disease.     a.     Status post coronary artery bypass grafting in 2009.  Left      internal mammary artery graft to left anterior descending coronary      artery, saphenous vein graft sequential to obtuse marginal 1 and      obtuse marginal 2, saphenous vein graft to right posterior      descending coronary artery.     b.     Status post cardiac catheterization this admission, patent      grafts but with progressed native vessel coronary artery disease.     c.     Normal left ventricular function, ejection fraction 55-60%.  SECONDARY DIAGNOSIS: 1. Hypertension. 2. Hyperlipidemia. 3. Diabetes mellitus complicated by diabetic retinopathy with previous     retinal bleed. 4. History of pancreatic tumor status post partial pancreatectomy and     splenectomy. 5. IV contrast dye allergy.  PROCEDURES/DIAGNOSTICS PERFORMED DURING HOSPITALIZATION: 1. Left heart catheterization with graft angiography and left     ventricular angiogram.     a.     Severe 3-vessel coronary artery disease, progressed since      last cardiac catheterization in 2010.     b.     Patent graft.     c.     Normal left ventricular function, EF 55-60%. 2. Chest x-ray on Jun 23, 2010:  Low lung volumes with bibasilar     atelectasis.  ALLERGIES:  CONTRAST MEDIA.  REASON FOR HOSPITALIZATION:  This is a 70 year old gentleman with multiple medical problems as stated above, who was continued to  have occasional chest pain since cardiac catheterization in 2010, which he was treated medically.  Over the past 2 weeks, he has had increased episodes of chest pain going to his back.  On the day prior to admission, he had pain lasting all day.  On the day of admission, the patient then experienced chest pain radiating to his back and his jaw associated with shortness of breath and diaphoresis.  He presented to [facility] for further evaluation.  By that time, the patient was chest pain free.  His initial EKG showed sinus rhythm with left axis deviation, inferior Q-waves, unchanged from prior tracing and no acute ST-T-wave abnormalities.  Initial cardiac markers were negative.  The patient was admitted with symptoms concerning for unstable angina.  Risks, benefits, and indications for cardiac catheterization were discussed with the patient and he agreed to proceed.  HOSPITAL COURSE:  The patient was admitted to the telemetry unit. Initially, he was placed on heparin but given his risk of retinalbleeding,  this was discontinued as cardiac enzymes were negative.  The patient did rule out for myocardial infarction.  With the patient's chest pain concerning for unstable angina, indication for cardiac catheterization.  With the patient having a dye allergy, he was placed on prophylaxis.  The patient was brought to the cath lab by Dr. Gala Romney, informed consent was obtained.  As above, the patient's graft remained patent but his native coronary artery disease has worsened. Dr. Gala Romney felt that it is appropriate to add Imdur 30 mg daily as well as Crestor 10 mg daily.  There will be further discussion considering outpatient cardiac rehab.  The patient tolerated the procedure well and was taken for observation and bedrest.  The patient's right femoral artery was without signs of hematoma.  If the patient is able to ambulate post bedrest without difficulty, he will be  discharged this evening, per Dr. Gala Romney.  DISCHARGE LABS:  WBC 11.1, hemoglobin 13.6, hematocrit 39.2, platelets 290.  Sodium 137, potassium 4.2, chloride 102, bicarb 25, BUN 22, creatinine 0.87.  DISCHARGE MEDICATIONS: 1. Imdur 30 mg 1 tablet daily. 2. Crestor 10 mg 1 tablet daily. 3. Aspirin enteric coated 81 mg 1 tablet daily. 4. Atenolol 25 mg 1 tablet daily. 5. Glipizide 10 mg 1 tablet twice daily. 6. Lantus SoloSTAR pen 12 units subcutaneously every evening. 7. Lisinopril/hydrochlorothiazide 10/12.5 mg 1 tablet daily.  FOLLOWUP PLANS AND INSTRUCTIONS: 1. The patient will follow up with Dr. Eden Emms on Jul 06, 2010, at 2:15     a.m. 2. The patient is to increase activity as tolerated.  He may shower     but no bathing.  No lifting for 1 week greater than 5 pounds.  No     driving for 2 days.  No sexual activity for 1 week.  He is to keep     his cath site clean and dry and call the office for any problems. 3. The patient is to continue low-sodium, heart-healthy, and diabetic     diet. 4. The patient is to avoid straining and stop any activity that causes     chest pain or shortness of breath. 5. The patient is to call the office in the interim for any problems     or concerns.  DURATION OF DISCHARGE:  Greater than 30 minutes with physician and physician extender time.     Leonette Monarch, PA-C   ______________________________ Bevelyn Buckles. Bensimhon, MD    NB/MEDQ  D:  06/24/2010  T:  06/25/2010  Job:  [id number]  cc:   [name], MD, [facility] Evelena Peat, M.D.  Electronically Signed by Alen Blew P.A. on 07/04/2010 12:05:31 PM Electronically Signed by Arvilla Meres MD on 07/06/2010 09:38:50 PM

## 2010-07-06 NOTE — Progress Notes (Signed)
Ricardo Le is seen today in F/U for CAD. He had CABG in 04/2007 by Dr. Cornelius Moras.  He has a normal EF.  He has HTN, elevated lipids and DM.  Cath 11/10 showed patent grafts with competitive flow in distal circumflex that was thought stable and best Rx medically     ROS: Denies fever, malais, weight loss, blurry vision, decreased visual acuity, cough, sputum, SOB, hemoptysis, pleuritic pain, palpitaitons, heartburn, abdominal pain, melena, lower extremity edema, claudication, or rash.   General: Affect appropriate Healthy:  appears stated age HEENT: normal Neck supple with no adenopathy JVP normal bilateral  bruits no thyromegaly Lungs clear with no wheezing and good diaphragmatic motion Heart:  S1/S2 no murmur,rub, gallop or click PMI normal Abdomen: benighn, BS positve, no tenderness, no AAA S/P laproscopic pancreatic surgery no bruit.  No HSM or HJR Distal pulses intact with no bruits No edema Neuro non-focal Skin warm and dry No muscular weakness   Current Outpatient Prescriptions  Medication Sig Dispense Refill  . amLODipine (NORVASC) 5 MG tablet Take 5 mg by mouth daily.        [name] Kitchen aspirin 81 MG EC tablet Take 81 mg by mouth daily.        [name] Kitchen dicyclomine (BENTYL) 20 MG tablet Take 1 by mouth two times daily as needed for abd. pain       . docusate sodium (COLACE) 100 MG capsule as needed.        [name] Kitchen glipiZIDE (GLUCOTROL) 10 MG tablet Take 10 mg by mouth daily.        [name] Kitchen HYDROcodone-acetaminophen (VICODIN) 5-500 MG per tablet Take 1 tab every 6-8 hours as needed for pain.(must last 1 month)       . insulin glargine (LANTUS SOLOSTAR) 100 UNIT/ML injection Inject 12 Units into the skin at bedtime.        [name] Kitchen lisinopril-hydrochlorothiazide (PRINZIDE,ZESTORETIC) 20-12.5 MG per tablet Take 1 tablet by mouth daily.        . magnesium hydroxide (MILK OF MAGNESIA) 400 MG/5ML suspension Take by mouth as needed.        . metFORMIN (GLUCOPHAGE) 500 MG tablet Take 500 mg by mouth daily.        . mineral oil  liquid as needed.        . polyethylene glycol powder (GLYCOLAX/MIRALAX) powder as directed.        . simvastatin (ZOCOR) 40 MG tablet Take 40 mg by mouth at bedtime.        . sucralfate (CARAFATE) 1 GM/10ML suspension 10cc by mouth two times a day       . DISCONTD: omeprazole (PRILOSEC) 20 MG capsule 1 by mouth 20-30 minutes before breakfast and dinner meal.         Allergies  Iohexol  Electrocardiogram:  Assessment and Plan

## 2010-07-06 NOTE — Patient Instructions (Signed)
Your physician recommends that you schedule a follow-up appointment in: 6 months with Dr. Eden Emms  Your physician has requested that you have a carotid duplex. This test is an ultrasound of the carotid arteries in your neck. It looks at blood flow through these arteries that supply the brain with blood. Allow one hour for this exam. There are no restrictions or special instructions.

## 2010-07-06 NOTE — Assessment & Plan Note (Signed)
Well controlled.  Continue current medications and low sodium Dash type diet.    

## 2010-07-06 NOTE — H&P (Signed)
Ricardo Le, Ricardo Le              ACCOUNT NO.:  [id number]  MEDICAL RECORD NO.:  [id number]           PATIENT TYPE:  E  LOCATION:  [facility]                         FACILITY:  [facility]  PHYSICIAN:  Bevelyn Buckles. Bensimhon, MDDATE OF BIRTH:  1941-01-08  DATE OF ADMISSION:  06/23/2010 DATE OF DISCHARGE:                             HISTORY & PHYSICAL   PRIMARY CARE PHYSICIAN:  Evelena Peat, MD  CARDIOLOGIST:  [name], MD, [facility]  CHIEF COMPLAINT:  Chest pain.  HISTORY OF PRESENT ILLNESS:  Ricardo Le is a 70 year old male with multiple medical problems including diabetes, complicated by diabetic retinopathy with a previous retinal bleed, hypertension, hyperlipidemia, coronary artery disease, status post bypass surgery in March 2009 by Dr. Cornelius Moras with LIMA to the LAD, a saphenous vein graft sequential to the OM1 and OM2, saphenous vein graft to the right PDA.  Since that time, he has struggled with occasional chest pain.  He had a cardiac cath in November 2010, which showed severe three-vessel coronary artery disease with patent grafts, the ejection fraction was 60%.  He was treated medically.  Note was made that his saphenous vein graft to the PDA was small, but it was patent.  Over the past 2 weeks, he notes multiple episodes of chest pain going to his back.  Yesterday, he had chest pain all day.  This morning, he felt well.  He was able to do his daily 1.2 miles walk without any difficulty.  Later in the day though he had chest pain radiating to his back and jaw associated with shortness of breath and diaphoresis.  He came to the ER.  He was pain-free.  His EKG and first set of cardiac markers are negative.  REVIEW OF SYSTEMS:  He also says that over the past 2 weeks he had indigestion.  It is hard for him to tell if this is causing his pain or not.  He has not had any bleeding.  No melena.  No bright red blood per rectum.  No fevers or chills.  No cough.  No heart failure  symptoms.  No focal neurologic symptoms.  Remainder of his review of systems is negative except for HPI and problem list.  PROBLEM LIST: 1. Coronary artery disease.     a.     Status post bypass surgery in March 2009 as described above.     b.     Last cardiac catheterization in November 2010 with severe      three-vessel coronary artery disease and patent grafts, EF 60%. 2. Hypertension. 3. Hyperlipidemia. 4. Diabetes, complicated by diabetic retinopathy with previous retinal     bleed.     a.     Follows with Dr. Ashley Royalty. 5. History of pancreatic tumors status post partial pancreatectomy and     splenectomy. 6. History of kidney stones.  CURRENT MEDICATIONS: 1. Aspirin 81 a day. 2. Atenolol 25 a day. 3. Lantus insulin glargine 12 units every evening. 4. Glipizide 10 b.i.d. 5. Lisinopril hydrochlorothiazide 10/12.5 daily.  ALLERGIES:  UROXATRAL AND IV CONTRAST.  SOCIAL HISTORY:  He is married.  He is retired.  He does  not drink or smoke.  FAMILY HISTORY:  Premature coronary artery disease on both mother and father's side.  PHYSICAL EXAM:  GENERAL:  He is in no acute distress.  His respirations are unlabored. VITAL SIGNS:  Blood pressure was 115/65, heart rate is 68.  He is satting 95% on 2 L nasal cannula. HEENT:  Normal. NECK:  Supple.  No JVD.  Carotids are 2+ bilaterally without bruits. There is no lymphadenopathy or thyromegaly. CARDIAC:  PMI is normal.  Regular rate and rhythm.  No murmurs, rubs, or gallops. LUNGS:  Clear. ABDOMEN:  Soft, nontender, and nondistended.  No hepatosplenomegaly.  No bruits.  No masses. EXTREMITIES:  Warm with no cyanosis, clubbing, or edema.  No rash. NEURO:  Alert and oriented x3.  Cranial nerves II through XII are intact.  Moves all fours without difficulty.  Affect is pleasant.  LABORATORY DATA:  EKG shows normal sinus rhythm at a rate of 68.  There is left axis deviation with inferior Q-waves as well as what appears to be  previous anterolateral Q-waves.  There is no acute ST-T wave abnormalities.  Chest x-ray shows low lung volumes with basilar atelectasis.  No acute process.  White count is 11.1, hemoglobin is 15.3, platelets 318.  Sodium 139, potassium 3.8, BUN of 24, creatinine 0.82, troponin is less than 0.30.  ASSESSMENT: 1. Chest pain concerning for unstable angina. 2. Coronary artery disease status post CABG in March 2009. 3. Diabetes, complicated by diabetic retinopathy.     a.     Status post retinal bleed in the past. 4. IV contrast dye allergy.  PLAN/DISCUSSION:  Ricardo Le chest pain is concerning for unstable angina.  I discussed this Dr. Eden Emms we will admit for rule out myocardial infarction.  We will plan cardiac catheterization tomorrow. We will stop his heparin now given his risk of retinal bleeding.  If his chest pain recurs or his cardiac enzymes turn positive, we will restart it.  If he does need a stent, we will possibly need to discuss with ophthalmology (Dr. Ashley Royalty) to make sure Plavix is okay.  We will premedicate him for his IV dye allergy.  We will continue his diabetes regimen and cover him with sliding scale while in the hospital.     Bevelyn Buckles. Bensimhon, MD     DRB/MEDQ  D:  06/23/2010  T:  06/23/2010  Job:  [id number]  cc:   Evelena Peat, M.D. [name], MD, [facility]  Electronically Signed by Arvilla Meres MD on 07/06/2010 09:38:42 PM

## 2010-07-20 ENCOUNTER — Encounter: Payer: Medicare Other | Admitting: *Deleted

## 2010-08-02 ENCOUNTER — Encounter: Payer: Self-pay | Admitting: Cardiovascular Disease

## 2010-08-02 ENCOUNTER — Encounter (INDEPENDENT_AMBULATORY_CARE_PROVIDER_SITE_OTHER): Payer: Medicare Other | Admitting: *Deleted

## 2010-08-02 DIAGNOSIS — I6529 Occlusion and stenosis of unspecified carotid artery: Secondary | ICD-10-CM

## 2010-11-07 LAB — PROTIME-INR
INR: 1.1
INR: 1.4
Prothrombin Time: 14
Prothrombin Time: 17 — ABNORMAL HIGH

## 2010-11-07 LAB — CBC
HCT: 30 — ABNORMAL LOW
HCT: 31 — ABNORMAL LOW
HCT: 38.5 — ABNORMAL LOW
HCT: 38.5 — ABNORMAL LOW
Hemoglobin: 10.3 — ABNORMAL LOW
Hemoglobin: 10.7 — ABNORMAL LOW
Hemoglobin: 13.3
Hemoglobin: 13.4
MCHC: 34.4
MCHC: 34.4
MCHC: 34.5
MCHC: 34.9
MCV: 89.9
MCV: 91
MCV: 91.4
MCV: 91.6
Platelets: 118 — ABNORMAL LOW
Platelets: 124 — ABNORMAL LOW
Platelets: 165
Platelets: 185
RBC: 3.28 — ABNORMAL LOW
RBC: 3.39 — ABNORMAL LOW
RBC: 4.24
RBC: 4.28
RDW: 13.1
RDW: 13.1
RDW: 13.2
RDW: 13.3
WBC: 12.5 — ABNORMAL HIGH
WBC: 13.9 — ABNORMAL HIGH
WBC: 14.2 — ABNORMAL HIGH
WBC: 16.7 — ABNORMAL HIGH

## 2010-11-07 LAB — COMPREHENSIVE METABOLIC PANEL
ALT: 13
ALT: 13
AST: 11
AST: 13
Albumin: 3.5
Albumin: 3.6
Alkaline Phosphatase: 37 — ABNORMAL LOW
Alkaline Phosphatase: 43
BUN: 19
BUN: 21
CO2: 28
CO2: 30
Calcium: 9.2
Calcium: 9.4
Chloride: 102
Chloride: 104
Creatinine, Ser: 0.95
Creatinine, Ser: 1.04
GFR calc Af Amer: >60
GFR calc Af Amer: >60
GFR calc non Af Amer: >60
GFR calc non Af Amer: >60
Glucose, Bld: 112 — ABNORMAL HIGH
Glucose, Bld: 84
Potassium: 3.5
Potassium: 3.7
Sodium: 138
Sodium: 143
Total Bilirubin: 0.6
Total Bilirubin: 0.8
Total Protein: 6.4
Total Protein: 6.8

## 2010-11-07 LAB — URINALYSIS, ROUTINE W REFLEX MICROSCOPIC
Bilirubin Urine: NEGATIVE
Glucose, UA: NEGATIVE
Hgb urine dipstick: NEGATIVE
Ketones, ur: NEGATIVE
Nitrite: NEGATIVE
Protein, ur: NEGATIVE
Specific Gravity, Urine: 1.022
Urobilinogen, UA: 0.2
pH: 5.5

## 2010-11-07 LAB — POCT I-STAT 3, ART BLOOD GAS (G3+)
Acid-Base Excess: 1
Acid-Base Excess: 1
Acid-Base Excess: 4 — ABNORMAL HIGH
Acid-Base Excess: 4 — ABNORMAL HIGH
Bicarbonate: 24
Bicarbonate: 24.9 — ABNORMAL HIGH
Bicarbonate: 27 — ABNORMAL HIGH
Bicarbonate: 27.8 — ABNORMAL HIGH
O2 Saturation: 100
O2 Saturation: 100
O2 Saturation: 100
O2 Saturation: 99
Operator id: 257021
Operator id: 271091
Operator id: 3342
Operator id: 3342
Patient temperature: 35.9
Patient temperature: 37.2
TCO2: 25
TCO2: 26
TCO2: 28
TCO2: 29
pCO2 arterial: 28.8 — ABNORMAL LOW
pCO2 arterial: 33.7 — ABNORMAL LOW
pCO2 arterial: 35.8
pCO2 arterial: 36
pH, Arterial: 7.451 — ABNORMAL HIGH
pH, Arterial: 7.496 — ABNORMAL HIGH
pH, Arterial: 7.512 — ABNORMAL HIGH
pH, Arterial: 7.525 — ABNORMAL HIGH
pO2, Arterial: 138 — ABNORMAL HIGH
pO2, Arterial: 159 — ABNORMAL HIGH
pO2, Arterial: 311 — ABNORMAL HIGH
pO2, Arterial: 384 — ABNORMAL HIGH

## 2010-11-07 LAB — POCT I-STAT 4, (NA,K, GLUC, HGB,HCT)
Glucose, Bld: 116 — ABNORMAL HIGH
Glucose, Bld: 119 — ABNORMAL HIGH
Glucose, Bld: 121 — ABNORMAL HIGH
Glucose, Bld: 123 — ABNORMAL HIGH
Glucose, Bld: 124 — ABNORMAL HIGH
Glucose, Bld: 140 — ABNORMAL HIGH
Glucose, Bld: 153 — ABNORMAL HIGH
HCT: 23 — ABNORMAL LOW
HCT: 23 — ABNORMAL LOW
HCT: 23 — ABNORMAL LOW
HCT: 26 — ABNORMAL LOW
HCT: 30 — ABNORMAL LOW
HCT: 32 — ABNORMAL LOW
HCT: 36 — ABNORMAL LOW
Hemoglobin: 10.2 — ABNORMAL LOW
Hemoglobin: 10.9 — ABNORMAL LOW
Hemoglobin: 12.2 — ABNORMAL LOW
Hemoglobin: 7.8 — CL
Hemoglobin: 7.8 — CL
Hemoglobin: 7.8 — CL
Hemoglobin: 8.8 — ABNORMAL LOW
Operator id: 271091
Operator id: 3342
Operator id: 3342
Operator id: 3342
Operator id: 3342
Operator id: 3342
Operator id: 3402
Potassium: 3.5
Potassium: 3.5
Potassium: 3.6
Potassium: 3.8
Potassium: 3.8
Potassium: 4.3
Potassium: 4.5
Sodium: 136
Sodium: 138
Sodium: 138
Sodium: 139
Sodium: 140
Sodium: 140
Sodium: 142

## 2010-11-07 LAB — POCT I-STAT 3, VENOUS BLOOD GAS (G3P V)
Acid-Base Excess: 1
Bicarbonate: 25.3 — ABNORMAL HIGH
O2 Saturation: 85
Operator id: 3342
TCO2: 26
pCO2, Ven: 40.1 — ABNORMAL LOW
pH, Ven: 7.407 — ABNORMAL HIGH
pO2, Ven: 49 — ABNORMAL HIGH

## 2010-11-07 LAB — LIPID PANEL
Cholesterol: 157
Cholesterol: 165
HDL: 39 — ABNORMAL LOW
HDL: 43
LDL Cholesterol: 102 — ABNORMAL HIGH
LDL Cholesterol: 91
Total CHOL/HDL Ratio: 3.8
Total CHOL/HDL Ratio: 4
Triglycerides: 101
Triglycerides: 135
VLDL: 20
VLDL: 27

## 2010-11-07 LAB — BLOOD GAS, ARTERIAL
Acid-Base Excess: 4.3 — ABNORMAL HIGH
Bicarbonate: 28.3 — ABNORMAL HIGH
Drawn by: 28701
FIO2: 0.21
O2 Saturation: 96.8
Patient temperature: 97.8
TCO2: 29.5
pCO2 arterial: 41
pH, Arterial: 7.45
pO2, Arterial: 82.2

## 2010-11-07 LAB — APTT
aPTT: 27
aPTT: 61 — ABNORMAL HIGH

## 2010-11-07 LAB — HEMOGLOBIN A1C
Hgb A1c MFr Bld: 6
Hgb A1c MFr Bld: 6
Mean Plasma Glucose: 136
Mean Plasma Glucose: 136

## 2010-11-07 LAB — POCT I-STAT, CHEM 8
BUN: 14
Calcium, Ion: 1.04 — ABNORMAL LOW
Chloride: 103
Creatinine, Ser: 1.1
Glucose, Bld: 151 — ABNORMAL HIGH
HCT: 28 — ABNORMAL LOW
Hemoglobin: 9.5 — ABNORMAL LOW
Potassium: 3.6
Sodium: 142
TCO2: 25

## 2010-11-07 LAB — CREATININE, SERUM
Creatinine, Ser: 0.83
GFR calc Af Amer: >60
GFR calc non Af Amer: >60

## 2010-11-07 LAB — PLATELET COUNT: Platelets: 118 — ABNORMAL LOW

## 2010-11-07 LAB — HEPARIN LEVEL (UNFRACTIONATED)
Heparin Unfractionated: 0.36
Heparin Unfractionated: 0.44

## 2010-11-07 LAB — HEMOGLOBIN AND HEMATOCRIT, BLOOD
HCT: 23.6 — ABNORMAL LOW
Hemoglobin: 8.3 — ABNORMAL LOW

## 2010-11-07 LAB — TYPE AND SCREEN
ABO/RH(D): A POS
Antibody Screen: NEGATIVE

## 2010-11-07 LAB — MAGNESIUM: Magnesium: 2.9 — ABNORMAL HIGH

## 2010-11-07 LAB — POCT I-STAT GLUCOSE
Glucose, Bld: 111 — ABNORMAL HIGH
Glucose, Bld: 152 — ABNORMAL HIGH
Operator id: 221371
Operator id: 3342

## 2010-11-07 LAB — ABO/RH: ABO/RH(D): A POS

## 2010-11-08 LAB — MAGNESIUM: Magnesium: 2.6 — ABNORMAL HIGH

## 2010-11-08 LAB — CBC
HCT: 27.7 — ABNORMAL LOW
HCT: 29 — ABNORMAL LOW
HCT: 31.1 — ABNORMAL LOW
Hemoglobin: 10.1 — ABNORMAL LOW
Hemoglobin: 10.9 — ABNORMAL LOW
Hemoglobin: 9.6 — ABNORMAL LOW
MCHC: 34.7
MCHC: 34.7
MCHC: 34.9
MCV: 90.6
MCV: 91.1
MCV: 91.2
Platelets: 109 — ABNORMAL LOW
Platelets: 123 — ABNORMAL LOW
Platelets: 147 — ABNORMAL LOW
RBC: 3.04 — ABNORMAL LOW
RBC: 3.2 — ABNORMAL LOW
RBC: 3.42 — ABNORMAL LOW
RDW: 13.2
RDW: 13.2
RDW: 13.4
WBC: 15.1 — ABNORMAL HIGH
WBC: 15.6 — ABNORMAL HIGH
WBC: 9.5

## 2010-11-08 LAB — BASIC METABOLIC PANEL
BUN: 11
BUN: 17
BUN: 21
CO2: 27
CO2: 30
CO2: 32
Calcium: 8.1 — ABNORMAL LOW
Calcium: 8.4
Calcium: 8.5
Chloride: 100
Chloride: 101
Chloride: 108
Creatinine, Ser: 0.86
Creatinine, Ser: 1.04
Creatinine, Ser: 1.13
GFR calc Af Amer: >60
GFR calc Af Amer: >60
GFR calc Af Amer: >60
GFR calc non Af Amer: >60
GFR calc non Af Amer: >60
GFR calc non Af Amer: >60
Glucose, Bld: 105 — ABNORMAL HIGH
Glucose, Bld: 133 — ABNORMAL HIGH
Glucose, Bld: 79
Potassium: 3.6
Potassium: 3.7
Potassium: 3.9
Sodium: 138
Sodium: 139
Sodium: 140

## 2010-11-14 LAB — DIFFERENTIAL
Basophils Absolute: 0
Basophils Relative: 0
Eosinophils Absolute: 0.1
Eosinophils Relative: 1
Lymphocytes Relative: 19
Lymphs Abs: 1.6
Monocytes Absolute: 0.6
Monocytes Relative: 8
Neutro Abs: 5.9
Neutrophils Relative %: 72

## 2010-11-14 LAB — COMPREHENSIVE METABOLIC PANEL
ALT: 38
AST: 19
Albumin: 3.9
Alkaline Phosphatase: 55
BUN: 16
CO2: 28
Calcium: 9.5
Chloride: 103
Creatinine, Ser: 0.92
GFR calc Af Amer: >60
GFR calc non Af Amer: >60
Glucose, Bld: 156 — ABNORMAL HIGH
Potassium: 4
Sodium: 140
Total Bilirubin: 0.7
Total Protein: 7.4

## 2010-11-14 LAB — CBC
HCT: 43.6
HCT: 44
Hemoglobin: 14.9
Hemoglobin: 15
MCHC: 34.1
MCHC: 34.2
MCV: 93.3
MCV: 93.8
Platelets: 207
Platelets: 265
RBC: 4.67
RBC: 4.69
RDW: 12.8
RDW: 13.5
WBC: 8.2
WBC: 9.5

## 2010-11-14 LAB — GLUCOSE, CAPILLARY
Glucose-Capillary: 130 — ABNORMAL HIGH
Glucose-Capillary: 166 — ABNORMAL HIGH
Glucose-Capillary: 206 — ABNORMAL HIGH
Glucose-Capillary: 216 — ABNORMAL HIGH
Glucose-Capillary: 250 — ABNORMAL HIGH
Glucose-Capillary: 256 — ABNORMAL HIGH

## 2010-11-14 LAB — PROTIME-INR
INR: 0.9
Prothrombin Time: 12.4

## 2010-11-14 LAB — BASIC METABOLIC PANEL
BUN: 18
CO2: 29
Calcium: 10.1
Chloride: 102
Creatinine, Ser: 0.91
GFR calc Af Amer: >60
GFR calc non Af Amer: >60
Glucose, Bld: 90
Potassium: 4.7
Sodium: 143

## 2011-02-20 ENCOUNTER — Encounter (INDEPENDENT_AMBULATORY_CARE_PROVIDER_SITE_OTHER): Payer: Medicare Other | Admitting: Ophthalmology

## 2011-03-08 DIAGNOSIS — Z951 Presence of aortocoronary bypass graft: Secondary | ICD-10-CM

## 2011-05-05 ENCOUNTER — Emergency Department (HOSPITAL_COMMUNITY): Payer: Medicare Other

## 2011-05-05 ENCOUNTER — Encounter (HOSPITAL_COMMUNITY): Payer: Self-pay | Admitting: Emergency Medicine

## 2011-05-05 ENCOUNTER — Inpatient Hospital Stay (HOSPITAL_COMMUNITY)
Admission: EM | Admit: 2011-05-05 | Discharge: 2011-05-06 | DRG: 392 | Disposition: A | Discharge status: Home / Self Care | Payer: Medicare Other | Source: Ambulatory Visit | Attending: Internal Medicine | Admitting: Internal Medicine

## 2011-05-05 DIAGNOSIS — R1084 Generalized abdominal pain: Secondary | ICD-10-CM

## 2011-05-05 DIAGNOSIS — Z951 Presence of aortocoronary bypass graft: Secondary | ICD-10-CM

## 2011-05-05 DIAGNOSIS — I2510 Atherosclerotic heart disease of native coronary artery without angina pectoris: Secondary | ICD-10-CM | POA: Diagnosis present

## 2011-05-05 DIAGNOSIS — K219 Gastro-esophageal reflux disease without esophagitis: Principal | ICD-10-CM | POA: Diagnosis present

## 2011-05-05 DIAGNOSIS — N200 Calculus of kidney: Secondary | ICD-10-CM | POA: Insufficient documentation

## 2011-05-05 DIAGNOSIS — Z7982 Long term (current) use of aspirin: Secondary | ICD-10-CM

## 2011-05-05 DIAGNOSIS — K8689 Other specified diseases of pancreas: Secondary | ICD-10-CM | POA: Insufficient documentation

## 2011-05-05 DIAGNOSIS — IMO0002 Reserved for concepts with insufficient information to code with codable children: Secondary | ICD-10-CM | POA: Insufficient documentation

## 2011-05-05 DIAGNOSIS — E1159 Type 2 diabetes mellitus with other circulatory complications: Secondary | ICD-10-CM | POA: Insufficient documentation

## 2011-05-05 DIAGNOSIS — M5126 Other intervertebral disc displacement, lumbar region: Secondary | ICD-10-CM | POA: Insufficient documentation

## 2011-05-05 DIAGNOSIS — E119 Type 2 diabetes mellitus without complications: Secondary | ICD-10-CM | POA: Diagnosis present

## 2011-05-05 DIAGNOSIS — E785 Hyperlipidemia, unspecified: Secondary | ICD-10-CM | POA: Diagnosis present

## 2011-05-05 DIAGNOSIS — I200 Unstable angina: Secondary | ICD-10-CM

## 2011-05-05 DIAGNOSIS — Z79899 Other long term (current) drug therapy: Secondary | ICD-10-CM

## 2011-05-05 DIAGNOSIS — Z794 Long term (current) use of insulin: Secondary | ICD-10-CM

## 2011-05-05 DIAGNOSIS — I10 Essential (primary) hypertension: Secondary | ICD-10-CM

## 2011-05-05 DIAGNOSIS — R079 Chest pain, unspecified: Secondary | ICD-10-CM

## 2011-05-05 DIAGNOSIS — R0989 Other specified symptoms and signs involving the circulatory and respiratory systems: Secondary | ICD-10-CM | POA: Insufficient documentation

## 2011-05-05 HISTORY — DX: Pneumonia, unspecified organism: J18.9

## 2011-05-05 HISTORY — DX: Other intervertebral disc displacement, lumbar region: M51.26

## 2011-05-05 HISTORY — DX: Gastro-esophageal reflux disease without esophagitis: K21.9

## 2011-05-05 HISTORY — DX: Atherosclerotic heart disease of native coronary artery without angina pectoris: I25.10

## 2011-05-05 HISTORY — DX: Other specified diseases of pancreas: K86.89

## 2011-05-05 HISTORY — DX: Calculus of kidney: N20.0

## 2011-05-05 LAB — GLUCOSE, CAPILLARY
Glucose-Capillary: 152 mg/dL — ABNORMAL HIGH (ref 70–99)
Glucose-Capillary: 166 mg/dL — ABNORMAL HIGH (ref 70–99)
Glucose-Capillary: 60 mg/dL — ABNORMAL LOW (ref 70–99)
Glucose-Capillary: 136 mg/dL — ABNORMAL HIGH (ref 70–99)
Glucose-Capillary: 72 mg/dL (ref 70–99)

## 2011-05-05 LAB — CBC
HCT: 43.3 % (ref 39.0–52.0)
Hemoglobin: 15.4 g/dL (ref 13.0–17.0)
MCH: 31.6 pg (ref 26.0–34.0)
MCHC: 35.6 g/dL (ref 30.0–36.0)
MCV: 88.9 fL (ref 78.0–100.0)
Platelets: 302 10*3/uL (ref 150–400)
RBC: 4.87 MIL/uL (ref 4.22–5.81)
RDW: 13.1 % (ref 11.5–15.5)
WBC: 8 10*3/uL (ref 4.0–10.5)

## 2011-05-05 LAB — DIFFERENTIAL
Basophils Absolute: 0.1 10*3/uL (ref 0.0–0.1)
Basophils Relative: 1 % (ref 0–1)
Eosinophils Absolute: 0.3 10*3/uL (ref 0.0–0.7)
Eosinophils Relative: 3 % (ref 0–5)
Lymphocytes Relative: 36 % (ref 12–46)
Lymphs Abs: 2.8 10*3/uL (ref 0.7–4.0)
Monocytes Absolute: 1 10*3/uL (ref 0.1–1.0)
Monocytes Relative: 13 % — ABNORMAL HIGH (ref 3–12)
Neutro Abs: 3.8 10*3/uL (ref 1.7–7.7)
Neutrophils Relative %: 48 % (ref 43–77)

## 2011-05-05 LAB — BASIC METABOLIC PANEL
BUN: 21 mg/dL (ref 6–23)
CO2: 25 mEq/L (ref 19–32)
Calcium: 9.7 mg/dL (ref 8.4–10.5)
Chloride: 102 mEq/L (ref 96–112)
Creatinine, Ser: 0.84 mg/dL (ref 0.50–1.35)
GFR calc Af Amer: >90 mL/min (ref >90)
GFR calc non Af Amer: 87 mL/min — ABNORMAL LOW (ref >90)
Glucose, Bld: 130 mg/dL — ABNORMAL HIGH (ref 70–99)
Potassium: 3.8 mEq/L (ref 3.5–5.1)
Sodium: 139 mEq/L (ref 135–145)

## 2011-05-05 LAB — POCT I-STAT TROPONIN I: Troponin i, poc: 0.01 ng/mL (ref 0.00–0.08)

## 2011-05-05 LAB — CARDIAC PANEL(CRET KIN+CKTOT+MB+TROPI)
CK, MB: 2.3 ng/mL (ref 0.3–4.0)
CK, MB: 2.1 ng/mL (ref 0.3–4.0)
CK, MB: 1.8 ng/mL (ref 0.3–4.0)
Relative Index: INVALID (ref 0.0–2.5)
Relative Index: INVALID (ref 0.0–2.5)
Relative Index: INVALID (ref 0.0–2.5)
Total CK: 71 U/L (ref 7–232)
Total CK: 66 U/L (ref 7–232)
Total CK: 64 U/L (ref 7–232)
Troponin I: <0.3 ng/mL (ref <0.30)
Troponin I: <0.3 ng/mL (ref <0.30)
Troponin I: <0.3 ng/mL (ref <0.30)

## 2011-05-05 LAB — CK TOTAL AND CKMB (NOT AT ARMC)
CK, MB: 2.4 ng/mL (ref 0.3–4.0)
Relative Index: INVALID (ref 0.0–2.5)
Total CK: 87 U/L (ref 7–232)

## 2011-05-05 LAB — TROPONIN I: Troponin I: <0.3 ng/mL (ref <0.30)

## 2011-05-05 MED ORDER — NITROGLYCERIN 0.4 MG SL SUBL: 0.4000 mg | SUBLINGUAL_TABLET | SUBLINGUAL | Status: DC | PRN

## 2011-05-05 MED ORDER — SODIUM CHLORIDE 0.9 % IJ SOLN
3.0000 mL | Freq: Two times a day (BID) | INTRAMUSCULAR | Status: DC
  Administered 2011-05-05 – 2011-05-06 (×3): 3 mL via INTRAVENOUS

## 2011-05-05 MED ORDER — GEMFIBROZIL 600 MG PO TABS
600.0000 mg | ORAL_TABLET | Freq: Two times a day (BID) | ORAL | Status: DC
  Administered 2011-05-05 – 2011-05-06 (×2): 600 mg via ORAL
  Filled 2011-05-05 (×4): qty 1

## 2011-05-05 MED ORDER — ASPIRIN 81 MG PO CHEW
324.0000 mg | CHEWABLE_TABLET | Freq: Once | ORAL | Status: AC
  Administered 2011-05-05: 324 mg via ORAL
  Filled 2011-05-05: qty 4

## 2011-05-05 MED ORDER — HYDROCHLOROTHIAZIDE 12.5 MG PO CAPS
12.5000 mg | ORAL_CAPSULE | Freq: Every day | ORAL | Status: DC
  Administered 2011-05-05 – 2011-05-06 (×2): 12.5 mg via ORAL
  Filled 2011-05-05 (×2): qty 1

## 2011-05-05 MED ORDER — ACETAMINOPHEN 325 MG PO TABS
650.0000 mg | ORAL_TABLET | ORAL | Status: DC | PRN
  Administered 2011-05-05: 650 mg via ORAL
  Filled 2011-05-05: qty 2

## 2011-05-05 MED ORDER — ALPRAZOLAM 0.25 MG PO TABS: 0.2500 mg | ORAL_TABLET | Freq: Two times a day (BID) | ORAL | Status: DC | PRN

## 2011-05-05 MED ORDER — GI COCKTAIL ~~LOC~~
30.0000 mL | Freq: Three times a day (TID) | ORAL | Status: DC | PRN
  Administered 2011-05-05: 30 mL via ORAL
  Filled 2011-05-05: qty 30

## 2011-05-05 MED ORDER — SODIUM CHLORIDE 0.9 % IV SOLN: 250.0000 mL | INTRAVENOUS | Status: DC | PRN

## 2011-05-05 MED ORDER — ASPIRIN 81 MG PO TBEC: 81.0000 mg | DELAYED_RELEASE_TABLET | Freq: Every day | ORAL | Status: DC

## 2011-05-05 MED ORDER — POLYETHYLENE GLYCOL 3350 17 GM/SCOOP PO POWD: 8.5000 g | Freq: Every day | ORAL | Status: DC | PRN

## 2011-05-05 MED ORDER — SODIUM CHLORIDE 0.9 % IV SOLN
20.0000 mL | INTRAVENOUS | Status: DC
  Administered 2011-05-05: 20 mL via INTRAVENOUS

## 2011-05-05 MED ORDER — INSULIN GLARGINE 100 UNIT/ML ~~LOC~~ SOLN: 14.0000 [IU] | Freq: Every day | SUBCUTANEOUS | Status: DC

## 2011-05-05 MED ORDER — INSULIN ASPART 100 UNIT/ML ~~LOC~~ SOLN
0.0000 [IU] | Freq: Three times a day (TID) | SUBCUTANEOUS | Status: DC
  Administered 2011-05-05: 3 [IU] via SUBCUTANEOUS
  Administered 2011-05-06: 2 [IU] via SUBCUTANEOUS

## 2011-05-05 MED ORDER — ADULT MULTIVITAMIN W/MINERALS CH
1.0000 | ORAL_TABLET | Freq: Every day | ORAL | Status: DC
  Administered 2011-05-05 – 2011-05-06 (×2): 1 via ORAL
  Filled 2011-05-05 (×2): qty 1

## 2011-05-05 MED ORDER — ZOLPIDEM TARTRATE 5 MG PO TABS: 5.0000 mg | ORAL_TABLET | Freq: Every evening | ORAL | Status: DC | PRN

## 2011-05-05 MED ORDER — POLYETHYLENE GLYCOL 3350 17 G PO PACK
8.5000 g | PACK | Freq: Every day | ORAL | Status: DC | PRN
  Filled 2011-05-05: qty 1

## 2011-05-05 MED ORDER — ONDANSETRON HCL 4 MG/2ML IJ SOLN: 4.0000 mg | Freq: Four times a day (QID) | INTRAMUSCULAR | Status: DC | PRN

## 2011-05-05 MED ORDER — ASPIRIN EC 81 MG PO TBEC
81.0000 mg | DELAYED_RELEASE_TABLET | Freq: Every day | ORAL | Status: DC
  Administered 2011-05-05 – 2011-05-06 (×2): 81 mg via ORAL
  Filled 2011-05-05 (×2): qty 1

## 2011-05-05 MED ORDER — SODIUM CHLORIDE 0.9 % IJ SOLN: 3.0000 mL | INTRAMUSCULAR | Status: DC | PRN

## 2011-05-05 MED ORDER — LISINOPRIL-HYDROCHLOROTHIAZIDE 20-12.5 MG PO TABS: 1.0000 | ORAL_TABLET | Freq: Every day | ORAL | Status: DC

## 2011-05-05 MED ORDER — GLIPIZIDE 10 MG PO TABS
10.0000 mg | ORAL_TABLET | Freq: Every day | ORAL | Status: DC
  Administered 2011-05-05 – 2011-05-06 (×2): 10 mg via ORAL
  Filled 2011-05-05 (×3): qty 1

## 2011-05-05 MED ORDER — PANTOPRAZOLE SODIUM 40 MG PO TBEC
40.0000 mg | DELAYED_RELEASE_TABLET | Freq: Two times a day (BID) | ORAL | Status: DC
  Administered 2011-05-05 – 2011-05-06 (×3): 40 mg via ORAL
  Filled 2011-05-05 (×2): qty 1

## 2011-05-05 MED ORDER — NITROGLYCERIN 2 % TD OINT
1.0000 [in_us] | TOPICAL_OINTMENT | Freq: Four times a day (QID) | TRANSDERMAL | Status: DC
  Administered 2011-05-05: 1 [in_us] via TOPICAL
  Filled 2011-05-05: qty 1

## 2011-05-05 MED ORDER — AMLODIPINE BESYLATE 5 MG PO TABS
5.0000 mg | ORAL_TABLET | Freq: Every day | ORAL | Status: DC
  Administered 2011-05-05 – 2011-05-06 (×2): 5 mg via ORAL
  Filled 2011-05-05 (×2): qty 1

## 2011-05-05 MED ORDER — LISINOPRIL 20 MG PO TABS
20.0000 mg | ORAL_TABLET | Freq: Every day | ORAL | Status: DC
  Administered 2011-05-05 – 2011-05-06 (×2): 20 mg via ORAL
  Filled 2011-05-05 (×2): qty 1

## 2011-05-05 MED ORDER — GI COCKTAIL ~~LOC~~
30.0000 mL | Freq: Every day | ORAL | Status: DC | PRN
  Administered 2011-05-05: 30 mL via ORAL
  Filled 2011-05-05: qty 30

## 2011-05-05 NOTE — ED Notes (Signed)
PT. WOKE UP AT 4 AM THIS MORNING WITH LEFT CHEST PAIN AND NAUSEA , DENIES SOB OR DIAPHORESIS, TOOK 1 BABY ASA PTA WITH SLIGHT RELIEF.

## 2011-05-05 NOTE — Progress Notes (Signed)
Blood sugar 60 before dinner. Pt ate and came up to 136. No S/S hypoglycemia. Will continue to monitor. Duwaine Maxin, RN

## 2011-05-05 NOTE — Discharge Instructions (Signed)
***  PLEASE REMEMBER TO BRING ALL OF YOUR MEDICATIONS TO EACH OF YOUR FOLLOW-UP OFFICE VISITS.  

## 2011-05-05 NOTE — H&P (Signed)
Patient ID: CHING RABIDEAU MRN: [id number], DOB/AGE: 1940-08-31   Admit date: 05/05/2011  Primary Physician: Kaleen Mask, MD, MD Primary Cardiologist: P. Eden Emms, MD  Pt. Profile:  71 y/o male w/ h/o CAD s/p CABG who presents with recurrent chest pain.  Problem List  Past Medical History  Diagnosis Date  . Palpitations   . Coronary atherosclerosis of unspecified type of vessel, native or graft     a. 04/2007 s/p CABG x 4  - LIMA->LAD, VG->OM1->Om2, VG->PDA;  b. 12/2008 & 06/2010 Caths - Native multivessel dzs with 4/4 patent grafts.  . Pure hypercholesterolemia   . Unspecified essential hypertension   . Type II or unspecified type diabetes mellitus without mention of complication, not stated as uncontrolled   . DDD (degenerative disc disease)     several surgeries  . Carotid bruit     a. 07/2010 U/S- 0-39% bilat ICA stenosis  . Nephrolithiasis   . HNP (herniated nucleus pulposus), lumbar     a. L2-3, s/p laminotomy, microdiskectomy 02/2009  . Pancreatic mass     a. 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    Past Surgical History  Procedure Date  . Re-exploratin of laminectomy 05/2008    (RT L2-3) WITH REDO MICRODISKECTOMY  . Cyst removed     FROM SPINE  . Nephrolithotomy     PERCUTANEOUS  . Coronary artery bypass graft     X 4 2009 (LIMA to LAD, SVG to first cicumflex marginal branch with sequential SVG to second cicumflex marginal branch  and saphenous vein to posterior descending coronary artery with endoscopic,vein harvest rt. lower exttremity by Dr.Owen on March 31,2009  . Cystourethroscopy     right retrograde pyelogram,manipulate stone in the renal pelvis, rt. double-j catheter.  . Pars plana vitrectomy     lt. eye,retinal photocoagulation lt. eye, membrane peel lt. eye.  . Cholecystectomy   . Appendectomy   . Arthroscopy right knee     Allergies  Allergies  Allergen Reactions  . Iohexol      Code: HIVES, Desc: hives - pt ok with  premedication, Onset Date: [id number]     HPI  71 y/o male with the above problem list.  He is s/p CABG x 4 in 2009 w/ 2 cath's since then - the last of which in 06/2010 - both showing 4/4 patent grafts and stable, severe native CAD.  He was in his usoh until ~ 1 wk ago when he began to experience intermittent, rest & exertional, sscp & pressure, assoc w/ indigestion, lasting 1-2 hrs @ a time, and resolving after taking 2 Tylenol tabs.  Ss never occurred during his daily 1.25 mile walk.  Pain not reproducible with cough, deep breathing, palpation, or position changes.  This AM, he awoke suddenly with recurrent 5/10 chest pain/pressure/"indigestion" assoc w/ nausea.  He took an ASA @ home and his wife drove him to the ED.  Here, he has received 4 baby ASA, SL NTG, and NTP.  He says that discomfort resolved about 30 mins after getting NTG.  He is currently pain free.  1st set of CE are normal.  Home Medications  Prior to Admission medications   Medication Sig Start Date End Date Taking? Authorizing Provider  amLODipine (NORVASC) 5 MG tablet Take 5 mg by mouth daily.     Yes Historical Provider, MD  aspirin 81 MG EC tablet Take 81 mg by mouth daily.     Yes Historical Provider, MD  glipiZIDE (  GLUCOTROL) 10 MG tablet Take 10 mg by mouth daily.     Yes Historical Provider, MD  insulin glargine (LANTUS SOLOSTAR) 100 UNIT/ML injection Inject 14 Units into the skin at bedtime.    Yes Historical Provider, MD  lisinopril-hydrochlorothiazide (PRINZIDE,ZESTORETIC) 20-12.5 MG per tablet Take 1 tablet by mouth daily.     Yes Historical Provider, MD  Multiple Vitamin (MULITIVITAMIN WITH MINERALS) TABS Take 1 tablet by mouth daily.   Yes Historical Provider, MD  polyethylene glycol powder (GLYCOLAX/MIRALAX) powder Take 8.5 g by mouth daily as needed. For constipation   Yes Historical Provider, MD   *Had also been taking Gemfibrozil - which he tolerated - but stopped taking recently for no apparent reason.  Family  History  Family History  Problem Relation Age of Onset  . Heart attack Father     died of MI @ 88  . Diabetes Brother     type 2  . Other      no fh of colon cancer  . Other Mother     died @ 81  . Coronary artery disease Brother     s/p CABG.  Alive @ 81  . Coronary artery disease Sister     alive   Social History  History   Social History  . Marital Status: Married    Spouse Name: N/A    Number of Children: N/A  . Years of Education: N/A   Occupational History  . Not on file.   Social History Main Topics  . Smoking status: Never Smoker   . Smokeless tobacco: Never Used  . Alcohol Use: No  . Drug Use: No  . Sexually Active: Not on file   Other Topics Concern  . Not on file   Social History Narrative   Lives in [place] with wife.  Retired Optician, dispensing.  Walks 1.56mi daily.  Does not work 2/2 back problems.    Review of Systems General:  No chills, fever, night sweats or weight changes.  Cardiovascular:  +++ chest pain/indigestion as outlined in the HPI.  No dyspnea on exertion, edema, orthopnea, palpitations, paroxysmal nocturnal dyspnea. Dermatological: No rash, lesions/masses Respiratory: No cough, dyspnea Urologic: No hematuria, dysuria Abdominal:   +++ nausea assoc w/ c/p this AM. No vomiting, diarrhea, bright red blood per rectum, melena, or hematemesis Neurologic:  No visual changes, wkns, changes in mental status. All other systems reviewed and are otherwise negative except as noted above.  Physical Exam  Blood pressure 145/62, pulse 55, temperature 97.7 F (36.5 C), resp. rate 20, SpO2 98.00%.  General: Pleasant, NAD Psych: Normal affect. Neuro: Alert and oriented X 3. Moves all extremities spontaneously. HEENT: Normal  Neck: Supple without JVD.  +bilat carotid brutis. Lungs:  Resp regular and unlabored, CTA. Heart: RRR no s3, s4, or murmurs. Abdomen: Soft, non-tender, non-distended, BS + x 4.  Extremities: No clubbing, cyanosis or edema.  DP/PT/Radials 2+ and equal bilaterally.  Labs  [facility] 05/05/11 0512  CKTOTAL 87  CKMB 2.4  TROPONINI <0.30   Lab Results  Component Value Date   WBC 8.0 05/05/2011   HGB 15.4 05/05/2011   HCT 43.3 05/05/2011   MCV 88.9 05/05/2011   PLT 302 05/05/2011    Lab 05/05/11 0512  NA 139  K 3.8  CL 102  CO2 25  BUN 21  CREATININE 0.84  CALCIUM 9.7  PROT --  BILITOT --  ALKPHOS --  ALT --  AST --  GLUCOSE 130*   Radiology/Studies  Dg Chest  2 View  05/05/2011  *RADIOLOGY REPORT*  Clinical Data: Chest pain and nausea.  CHEST - 2 VIEW  Comparison: Chest radiograph performed 06/23/2010  Findings: The lungs are well-aerated.  Mild right basilar atelectasis is noted.  There is no evidence of focal opacification, pleural effusion or pneumothorax.  The heart is normal in size; the patient is status post median sternotomy, with evidence of prior CABG.  No acute osseous abnormalities are seen.  Scattered calcification is noted along the proximal abdominal aorta.  Clips are noted within the right upper quadrant, reflecting prior cholecystectomy.  IMPRESSION: Mild right basilar atelectasis; lungs otherwise clear.  Original Report Authenticated By: Tonia Ghent, M.D.   ECG  SB, 56, Inc RBBB, LAD, twi aVL - no acute changes.  ASSESSMENT AND PLAN  1.  USA/CAD:  Pt presents w/ a 1 wk h/o intermittent chest discomfort which acutely worsened this AM.  Despite prolonged Ss throughout the week, so far CE are negative.  He is currently pain free.  Plan to admit and cycle CE.  Cont asa.  Resume Lopid.  If he R/I -> Cath.  If he R/O, prob d/c in AM and arrange for outpt MV.  2.  HTN:  Cont home meds.  3.  DM:  Cont lantus.  Hold metformin.  Add ssi.  4.  HL:  Resume Lopid.  He did not tolerate simva or rosuvastatin.  Signed, Nicolasa Ducking, NP 05/05/2011, 7:47 AM  Patient seen and examined with Ward Givens, NP. We discussed all aspects of the encounter. I agree with the assessment and plan as  stated above.   CP with atypical/typical features. No exertional component. Preserved functional capacity. Cath in 06/2010 reviewed and showed 4/4 patent grafts. Discussed options of repeat cath vs admit for r/o MI. Prefers the 2nd. If CE + or recurrent severe CP will cath. Otherwise can go home in am with outpatient ETT/Myoview.   Kaleia Longhi,MD 8:05 AM

## 2011-05-05 NOTE — ED Provider Notes (Signed)
History     CSN: [id number]  Arrival date & time 05/05/11  0505   First MD Initiated Contact with Patient 05/05/11 [phone]      Chief Complaint  Patient presents with  . Chest Pain  Card: Dr Eden Emms  HPI Pt has history of CAD.  Pt has been having pain in his chest this past week.  This am he woke up at 4 am with pain and nausea.  The pain is in the left chest with pain in his left arm.  Pt states it is a pressure sensation.  No shortness of breath.  No fever or cough.    Last cath was two years ago.  He has noticed some burning indigestion as well. Right now it is a 3/10 Past Medical History  Diagnosis Date  . Palpitations   . Coronary atherosclerosis of unspecified type of vessel, native or graft   . Pure hypercholesterolemia   . Unspecified essential hypertension   . Type II or unspecified type diabetes mellitus without mention of complication, not stated as uncontrolled   . DDD (degenerative disc disease)     several surgeries  . Carotid bruit     previous hx.    Past Surgical History  Procedure Date  . Re-exploratin of laminectomy 05/2008    (RT L2-3) WITH REDO MICRODISKECTOMY  . Cyst removed     FROM SPINE  . Nephrolithotomy     PERCUTANEOUS  . Coronary artery bypass graft     X 4 2009 (LIMA to LAD, SVG to first cicumflex marginal branch with sequential SVG to second cicumflex marginal branch  and saphenous vein to posterior descending coronary artery with endoscopic,vein harvest rt. lower exttremity by Dr.Owen on March 31,2009  . Cystourethroscopy     right retrograde pyelogram,manipulate stone in the renal pelvis, rt. double-j catheter.  . Pars plana vitrectomy     lt. eye,retinal photocoagulation lt. eye, membrane peel lt. eye.  . Cholecystectomy   . Appendectomy   . Arthroscopy right knee     Family History  Problem Relation Age of Onset  . Other      non- contributory  . Heart disease Father   . Diabetes Brother     type 2  . Other      no fh of colon  cancer    History  Substance Use Topics  . Smoking status: Never Smoker   . Smokeless tobacco: Not on file  . Alcohol Use: No      Review of Systems  All other systems reviewed and are negative.    Allergies  Iohexol  Home Medications   Current Outpatient Rx  Name Route Sig Dispense Refill  . AMLODIPINE BESYLATE 5 MG PO TABS Oral Take 5 mg by mouth daily.      . ASPIRIN 81 MG PO TBEC Oral Take 81 mg by mouth daily.      [name] Kitchen GLIPIZIDE 10 MG PO TABS Oral Take 10 mg by mouth daily.      . INSULIN GLARGINE 100 UNIT/ML Allison SOLN Subcutaneous Inject 14 Units into the skin at bedtime.     [name] Kitchen LISINOPRIL-HYDROCHLOROTHIAZIDE 20-12.5 MG PO TABS Oral Take 1 tablet by mouth daily.      . ADULT MULTIVITAMIN W/MINERALS CH Oral Take 1 tablet by mouth daily.    [name] Kitchen POLYETHYLENE GLYCOL 3350 PO POWD Oral Take 8.5 g by mouth daily as needed. For constipation      BP 151/65  Pulse 55  Temp 97.7  F (36.5 C)  Resp 20  SpO2 96%  Physical Exam  Nursing note and vitals reviewed. Constitutional: He appears well-developed and well-nourished. No distress.  HENT:  Head: Normocephalic and atraumatic.  Right Ear: External ear normal.  Left Ear: External ear normal.  Eyes: Conjunctivae are normal. Right eye exhibits no discharge. Left eye exhibits no discharge. No scleral icterus.  Neck: Neck supple. No tracheal deviation present.  Cardiovascular: Normal rate, regular rhythm and intact distal pulses.   Pulmonary/Chest: Effort normal and breath sounds normal. No stridor. No respiratory distress. He has no wheezes. He has no rales.  Abdominal: Soft. Bowel sounds are normal. He exhibits no distension. There is no tenderness. There is no rebound and no guarding.  Musculoskeletal: He exhibits no edema and no tenderness.  Neurological: He is alert. He has normal strength. No sensory deficit. Cranial nerve deficit:  no gross defecits noted. He exhibits normal muscle tone. He displays no seizure activity.  Coordination normal.  Skin: Skin is warm and dry. No rash noted.  Psychiatric: He has a normal mood and affect.    ED Course  Procedures (including critical care time)  Date: 05/05/2011  Rate: 56  Rhythm: sinus bradycardia  QRS Axis: left  Intervals: normal  ST/T Wave abnormalities: normal  Conduction Disutrbances:incomplete RBBB   Narrative Interpretation:   Old EKG Reviewed: unchanged  Medications  Multiple Vitamin (MULITIVITAMIN WITH MINERALS) TABS (not administered)  0.9 %  sodium chloride infusion (20 mL Intravenous New Bag/Given 05/05/11 0544)  nitroGLYCERIN (NITROSTAT) SL tablet 0.4 mg (not administered)  nitroGLYCERIN (NITROGLYN) 2 % ointment 1 inch (1 inch Topical Given 05/05/11 0544)  aspirin chewable tablet 324 mg (324 mg Oral Given 05/05/11 0544)     Labs Reviewed  DIFFERENTIAL - Abnormal; Notable for the following:    Monocytes Relative 13 (*)    All other components within normal limits  BASIC METABOLIC PANEL - Abnormal; Notable for the following:    Glucose, Bld 130 (*)    GFR calc non Af Amer 87 (*)    All other components within normal limits  CK TOTAL AND CKMB  TROPONIN I  CBC  POCT I-STAT TROPONIN I   Dg Chest 2 View  05/05/2011  *RADIOLOGY REPORT*  Clinical Data: Chest pain and nausea.  CHEST - 2 VIEW  Comparison: Chest radiograph performed 06/23/2010  Findings: The lungs are well-aerated.  Mild right basilar atelectasis is noted.  There is no evidence of focal opacification, pleural effusion or pneumothorax.  The heart is normal in size; the patient is status post median sternotomy, with evidence of prior CABG.  No acute osseous abnormalities are seen.  Scattered calcification is noted along the proximal abdominal aorta.  Clips are noted within the right upper quadrant, reflecting prior cholecystectomy.  IMPRESSION: Mild right basilar atelectasis; lungs otherwise clear.  Original Report Authenticated By: Tonia Ghent, M.D.     1. Chest pain   2. CAD        MDM  Pt is now pain free.  Pt describes some GERD symptoms however his pain is concerning for the possibility of recurrent acute coronary syndrome.  I will consult his cardiologist regarding admission for further evaluation.        Celene Kras, MD 05/05/11 [phone]

## 2011-05-06 ENCOUNTER — Other Ambulatory Visit: Payer: Self-pay

## 2011-05-06 ENCOUNTER — Encounter (HOSPITAL_COMMUNITY): Payer: Self-pay | Admitting: Internal Medicine

## 2011-05-06 DIAGNOSIS — R079 Chest pain, unspecified: Secondary | ICD-10-CM

## 2011-05-06 LAB — LIPID PANEL
Cholesterol: 170 mg/dL (ref 0–200)
HDL: 42 mg/dL (ref >39)
LDL Cholesterol: 105 mg/dL — ABNORMAL HIGH (ref 0–99)
Total CHOL/HDL Ratio: 4 RATIO
Triglycerides: 115 mg/dL (ref <150)
VLDL: 23 mg/dL (ref 0–40)

## 2011-05-06 LAB — GLUCOSE, CAPILLARY: Glucose-Capillary: 138 mg/dL — ABNORMAL HIGH (ref 70–99)

## 2011-05-06 MED ORDER — GEMFIBROZIL 600 MG PO TABS: 600.0000 mg | ORAL_TABLET | Freq: Two times a day (BID) | ORAL | Status: AC

## 2011-05-06 MED ORDER — HYDROCHLOROTHIAZIDE 12.5 MG PO CAPS: 12.5000 mg | ORAL_CAPSULE | Freq: Every day | ORAL | Status: AC

## 2011-05-06 MED ORDER — PANTOPRAZOLE SODIUM 40 MG PO TBEC: 40.0000 mg | DELAYED_RELEASE_TABLET | Freq: Two times a day (BID) | ORAL | Status: DC

## 2011-05-06 MED ORDER — NITROGLYCERIN 0.4 MG SL SUBL: 0.4000 mg | SUBLINGUAL_TABLET | SUBLINGUAL | Status: DC | PRN

## 2011-05-06 NOTE — Progress Notes (Signed)
Patient Name: Ricardo Le      SUBJECTIVE: Patient admitted with chest pain and prior bypass surgery 2009 cath May 2012 demonstrating patent grafts  He has ruled out for myocardial infarction. The plan was to discharge with outpatient Myoview.  He has a history of GE reflux disease and he thinks that this is what's going on  Past Medical History  Diagnosis Date  . Palpitations   . Coronary atherosclerosis of unspecified type of vessel, native or graft     a. 04/2007 s/p CABG x 4  - LIMA->LAD, VG->OM1->Om2, VG->PDA;  b. 12/2008 & 06/2010 Caths - Native multivessel dzs with 4/4 patent grafts.  . Pure hypercholesterolemia   . DDD (degenerative disc disease)     several surgeries  . Carotid bruit     a. 07/2010 U/S- 0-39% bilat ICA stenosis  . Nephrolithiasis   . HNP (herniated nucleus pulposus), lumbar     a. L2-3, s/p laminotomy, microdiskectomy 02/2009  . Pancreatic mass     a. 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 Angina   . Unspecified essential hypertension   . Pneumonia     hx of  pna  . Type II or unspecified type diabetes mellitus without mention of complication, not stated as uncontrolled     insulin dependent  . GERD (gastroesophageal reflux disease)     PHYSICAL EXAM Filed Vitals:   05/05/11 0830 05/05/11 0915 05/05/11 1352 05/05/11 2145  BP: 137/70 141/72 115/67 128/52  Pulse: 56 55 64 58  Temp:  97.2 F (36.2 C) 97.9 F (36.6 C) 98 F (36.7 C)  TempSrc:  Oral Oral   Resp: 11 18 18 16   Height:  5\' 7"  (1.702 m)    Weight:  161 lb 14.4 oz (73.437 kg)    SpO2: 97% 97% 95% 93%   Well developed and nourished in no acute distress HENT normal Neck supple with JVP-flat Clear Regular rate and rhythm, no murmurs or gallops Abd-soft with active BS No Clubbing cyanosis edema Skin-warm and dry A & Oriented  Grossly normal sensory and motor function  TELEMETRY: Reviewed telemetry pt in * nsr    Intake/Output Summary (Last 24 hours)  at 05/06/11 1029 Last data filed at 05/06/11 0900  Gross per 24 hour  Intake    240 ml  Output      0 ml  Net    240 ml    LABS: Basic Metabolic Panel:  Lab 05/05/11 1610  NA 139  K 3.8  CL 102  CO2 25  GLUCOSE 130*  BUN 21  CREATININE 0.84  CALCIUM 9.7  MG --  PHOS --   Cardiac Enzymes:  Basename 05/05/11 2105 05/05/11 1405 05/05/11 1015  CKTOTAL 64 66 71  CKMB 1.8 2.1 2.3  CKMBINDEX -- -- --  TROPONINI <0.30 <0.30 <0.30   CBC:  Lab 05/05/11 0512  WBC 8.0  NEUTROABS 3.8  HGB 15.4  HCT 43.3  MCV 88.9  PLT 302    Basename 05/06/11 0500  CHOL 170  HDL 42  LDLCALC 105*  TRIG 115  CHOLHDL 4.0  LDLDIRECT --     ASSESSMENT AND PLAN:  Admitted with chest pain and has ruled out for MI. He thinks is probably his reflux. I've asked that he followup with his PCP regarding changing his PPI therapy.  He has been statin intolerant. His LDL is not at goal. I've asked him to try in anticipation of his followup with Dr.  PNishan to try taking his statin at home 2 days a week.  He has statins at home that he can use.   Signed, Sherryl Manges MD  05/06/2011

## 2011-05-06 NOTE — Discharge Summary (Signed)
Physician Discharge Summary  Patient ID: Ricardo Le MRN: [id number] DOB/AGE: 1940/04/11 71 y.o.  Admit date: 05/05/2011 Discharge date: 05/06/2011  Primary Discharge Diagnosis: Chest Pain  Secondary Discharge Diagnosis 1. CAD-S/P CABG in 2009 (LIMA-LAD, VG to OM1 and OM2, VG to PDA) 2. Hypercholesterolemia 3.Carotid Bruits 4. GERD 4. Hypertension  Significant Diagnostic Studies:None  Consults: None  Hospital Course: Ricardo Le is a 71 year old patient of Dr. Theron Arista admission with known history of coronary artery disease status post coronary artery bypass grafting in 2009 with subsequent cardiac catheterization in May 2012 revealing native multivessel disease with 4 of 4 patent grafts. He awoke suddenly on day of admission with chest pain pressure, indigestion, without associated nausea. He was admitted to rule out myocardial infarction. Cardiac enzymes are found to be negative throughout hospitalization. There were no acute ST-T wave changes. He was given GI cocktail, which gave him relief. It was felt that his symptoms were related to GERD. Of note, he has been statin intolerant. His LDL was not found to be at goal and therefore he is to followup with Dr. admission and try having him take his statin every other day or only 2 days a week. Followup appointment for Dr. Eden Emms will be made and also discussion for need for stress test at his discretion at that time. He was seen and examined by Dr. Graciela Husbands and found to be stable to go home.   Discharge Exam: Blood pressure 128/52, pulse 58, temperature 98 F (36.7 C), temperature source Oral, resp. rate 16, height 5\' 7"  (1.702 m), weight 161 lb 14.4 oz (73.437 kg), SpO2 93.00%.  Please see Dr.Klein's discharge progress note for exam information  Labs:   Lab Results  Component Value Date   WBC 8.0 05/05/2011   HGB 15.4 05/05/2011   HCT 43.3 05/05/2011   MCV 88.9 05/05/2011   PLT 302 05/05/2011    Lab 05/05/11 0512  NA 139  K 3.8  CL 102    CO2 25  BUN 21  CREATININE 0.84  CALCIUM 9.7  PROT --  BILITOT --  ALKPHOS --  ALT --  AST --  GLUCOSE 130*   Lab Results  Component Value Date   CKTOTAL 64 05/05/2011   CKMB 1.8 05/05/2011   TROPONINI <0.30 05/05/2011    Lab Results  Component Value Date   CHOL 170 05/06/2011   CHOL  Value: 157        ATP III CLASSIFICATION:  <200     mg/dL   Desirable  119-147  mg/dL   Borderline High  >=829    mg/dL   High 5/62/1308   CHOL  Value: 165        ATP III CLASSIFICATION:  <200     mg/dL   Desirable  657-846  mg/dL   Borderline High  >=962    mg/dL   High 9/52/8413   Lab Results  Component Value Date   HDL 42 05/06/2011   HDL 39* 05/14/2007   HDL 43 05/13/2007   Lab Results  Component Value Date   LDLCALC 105* 05/06/2011   LDLCALC  Value: 91        Total Cholesterol/HDL:CHD Risk Coronary Heart Disease Risk Table                     Men   Women  1/2 Average Risk   3.4   3.3 05/14/2007   LDLCALC  Value: 102        Total  Cholesterol/HDL:CHD Risk Coronary Heart Disease Risk Table                     Men   Women  1/2 Average Risk   3.4   3.3* 05/13/2007   Lab Results  Component Value Date   TRIG 115 05/06/2011   TRIG 135 05/14/2007   TRIG 101 05/13/2007   Lab Results  Component Value Date   CHOLHDL 4.0 05/06/2011   CHOLHDL 4.0 05/14/2007   CHOLHDL 3.8 05/13/2007   No results found for this basename: LDLDIRECT      Radiology: Dg Chest 2 View  05/05/2011  *RADIOLOGY REPORT*  Clinical Data: Chest pain and nausea.  CHEST - 2 VIEW  Comparison: Chest radiograph performed 06/23/2010  Findings: The lungs are well-aerated.  Mild right basilar atelectasis is noted.  There is no evidence of focal opacification, pleural effusion or pneumothorax.  The heart is normal in size; the patient is status post median sternotomy, with evidence of prior CABG.  No acute osseous abnormalities are seen.  Scattered calcification is noted along the proximal abdominal aorta.  Clips are noted within the right upper  quadrant, reflecting prior cholecystectomy.  IMPRESSION: Mild right basilar atelectasis; lungs otherwise clear.  Original Report Authenticated By: Tonia Ghent, M.D.   FOLLOW UP PLANS AND APPOINTMENTS Discharge Orders    Future Appointments: Provider: Department: Dept Phone: Center:   05/15/2011 12:00 PM [facility]  None   06/07/2011 9:15 AM Wendall Stade, MD [facility] [phone] [facility]   08/22/2011 8:30 AM Sherrie George, MD [facility] [phone] None     Future Orders Please Complete By Expires   Diet - low sodium heart healthy      Increase activity slowly        Medication List  As of 05/06/2011 12:00 PM   TAKE these medications         amLODipine 5 MG tablet   Commonly known as: NORVASC   Take 5 mg by mouth daily.      aspirin 81 MG EC tablet   Take 81 mg by mouth daily.      gemfibrozil 600 MG tablet   Commonly known as: LOPID   Take 1 tablet (600 mg total) by mouth 2 (two) times daily before a meal.      glipiZIDE 10 MG tablet   Commonly known as: GLUCOTROL   Take 10 mg by mouth daily.      hydrochlorothiazide 12.5 MG capsule   Commonly known as: MICROZIDE   Take 1 capsule (12.5 mg total) by mouth daily.      LANTUS SOLOSTAR 100 UNIT/ML injection   Generic drug: insulin glargine   Inject 14 Units into the skin at bedtime.      lisinopril-hydrochlorothiazide 20-12.5 MG per tablet   Commonly known as: PRINZIDE,ZESTORETIC   Take 1 tablet by mouth daily.      mulitivitamin with minerals Tabs   Take 1 tablet by mouth daily.      nitroGLYCERIN 0.4 MG SL tablet   Commonly known as: NITROSTAT   Place 1 tablet (0.4 mg total) under the tongue every 5 (five) minutes x 3 doses as needed for chest pain.      pantoprazole 40 MG tablet   Commonly known as: PROTONIX   Take 1 tablet (40 mg total) by mouth 2 (two) times daily.      polyethylene glycol powder powder  Commonly known as: GLYCOLAX/MIRALAX    Take 8.5 g by mouth daily as needed. For constipation           Follow-up Information    Follow up with Kaleen Mask, MD. (As scheduled)       Follow up with [facility] on 05/15/2011. (12:00 PM - Exercise Stress Test (Nothing to eat or drink after 6 AM; No caffeine on 3/31))    Contact information:   [address] [place] [phone]      Follow up with Charlton Haws, MD on 06/07/2011. (9:15 AM)    Contact information:   [address] [place] [address] [phone]            Time spent with patient to include physician time:30 minutes Signed: Joni Reining 05/06/2011, 12:00 PM Co-Sign MD

## 2011-05-10 ENCOUNTER — Other Ambulatory Visit (HOSPITAL_COMMUNITY): Payer: Self-pay | Admitting: Cardiovascular Disease

## 2011-05-10 DIAGNOSIS — R079 Chest pain, unspecified: Secondary | ICD-10-CM

## 2011-05-15 ENCOUNTER — Ambulatory Visit (HOSPITAL_COMMUNITY): Payer: Medicare Other | Attending: Cardiology | Admitting: Radiology

## 2011-05-15 VITALS — BP 136/64 | Ht 67.0 in | Wt 162.0 lb

## 2011-05-15 DIAGNOSIS — I2510 Atherosclerotic heart disease of native coronary artery without angina pectoris: Secondary | ICD-10-CM

## 2011-05-15 DIAGNOSIS — R0602 Shortness of breath: Secondary | ICD-10-CM

## 2011-05-15 DIAGNOSIS — Z794 Long term (current) use of insulin: Secondary | ICD-10-CM | POA: Insufficient documentation

## 2011-05-15 DIAGNOSIS — E785 Hyperlipidemia, unspecified: Secondary | ICD-10-CM | POA: Insufficient documentation

## 2011-05-15 DIAGNOSIS — E119 Type 2 diabetes mellitus without complications: Secondary | ICD-10-CM | POA: Insufficient documentation

## 2011-05-15 DIAGNOSIS — R0609 Other forms of dyspnea: Secondary | ICD-10-CM | POA: Insufficient documentation

## 2011-05-15 DIAGNOSIS — Z951 Presence of aortocoronary bypass graft: Secondary | ICD-10-CM | POA: Insufficient documentation

## 2011-05-15 DIAGNOSIS — I10 Essential (primary) hypertension: Secondary | ICD-10-CM | POA: Insufficient documentation

## 2011-05-15 DIAGNOSIS — Z8249 Family history of ischemic heart disease and other diseases of the circulatory system: Secondary | ICD-10-CM | POA: Insufficient documentation

## 2011-05-15 DIAGNOSIS — R0989 Other specified symptoms and signs involving the circulatory and respiratory systems: Secondary | ICD-10-CM | POA: Insufficient documentation

## 2011-05-15 DIAGNOSIS — R002 Palpitations: Secondary | ICD-10-CM | POA: Insufficient documentation

## 2011-05-15 DIAGNOSIS — I779 Disorder of arteries and arterioles, unspecified: Secondary | ICD-10-CM | POA: Insufficient documentation

## 2011-05-15 DIAGNOSIS — R110 Nausea: Secondary | ICD-10-CM | POA: Insufficient documentation

## 2011-05-15 DIAGNOSIS — R079 Chest pain, unspecified: Secondary | ICD-10-CM

## 2011-05-15 MED ORDER — TECHNETIUM TC 99M TETROFOSMIN IV KIT
10.6000 | PACK | Freq: Once | INTRAVENOUS | Status: AC | PRN
  Administered 2011-05-15: 11 via INTRAVENOUS

## 2011-05-15 MED ORDER — TECHNETIUM TC 99M TETROFOSMIN IV KIT
33.0000 | PACK | Freq: Once | INTRAVENOUS | Status: AC | PRN
  Administered 2011-05-15: 33 via INTRAVENOUS

## 2011-05-15 NOTE — Progress Notes (Signed)
[facility] SITE 3 NUCLEAR MED [address] [place] [address] [phone]  Cardiology Nuclear Med Study  Ricardo Le is a 71 y.o. male     MRN : [id number]     DOB: October 18, 1940  Procedure Date: 05/15/2011  Nuclear Med Background Indication for Stress Test:  Evaluation for Ischemia, Graft Patency and 05/05/11 Post Hospital: Chest pain, (-) enzymes/EKG History:  1/09 Myocardial Perfusion Study-(-) ischemia, (-) scar, mild apical thinning EF=61%, 3/09 CABG x 4, 5/12 Heart Catheterization-Patent grafts Cardiac Risk Factors: Carotid Disease, Family History - CAD, Hypertension, IDDM Type 2 and Lipids  Symptoms: Chest Pain and Chest Tightness with/without exertion (last occurrence one week ago),  DOE, Nausea and Palpitations   Nuclear Pre-Procedure Caffeine/Decaff Intake:  None NPO After: 7:00am   Lungs:  clear O2 Sat: 97% on room air. IV 0.9% NS with Angio Cath:  22g  IV Site: R Forearm  IV Started by:  Ricardo Le, EMT-P  Chest Size (in):  42 Cup Size: n/a  Height: 5\' 7"  (1.702 m)  Weight:  162 lb (73.483 kg)  BMI:  Body mass index is 25.37 kg/(m^2). Tech Comments:  CBG= 129@ 7:30 am, per patient.    Nuclear Med Study 1 or 2 day study: 1 day  Stress Test Type:  Stress  Reading MD: Ricardo Rough, MD  Order Authorizing Provider:  Charlton Haws, MD  Resting Radionuclide: Technetium 76m Tetrofosmin  Resting Radionuclide Dose: 10.6 mCi   Stress Radionuclide:  Technetium 80m Tetrofosmin  Stress Radionuclide Dose: 33.0 mCi           Stress Protocol Rest HR: 61 Stress HR: 129  Rest BP: 136/64 Stress BP: 223/68  Exercise Time (min): 8:59 METS: 10.1   Predicted Max HR: 151 bpm % Max HR: 85.43 bpm Rate Pressure Product: 21308   Dose of Adenosine (mg):  n/a Dose of Lexiscan: n/a mg  Dose of Atropine (mg): n/a Dose of Dobutamine: n/a mcg/kg/min (at max HR)  Stress Test Technologist: Ricardo Hong, RN  Nuclear Technologist:  Ricardo Le, CNMT     Rest  Procedure:  Myocardial perfusion imaging was performed at rest 45 minutes following the intravenous administration of Technetium 31m Tetrofosmin. Rest ECG: NSR with LAFB, PVC  Stress Procedure:  The patient performed treadmill exercise using a Bruce  Protocol for 8 minutes and 59 seconds, RPE=15 minutes. The patient stopped due to Fatigue and denied any chest pain.  There were nonspecific T wave changes. There was a hypertensive response to exercise. There were frequent PVC's, bigeminy PVC. Technetium 77m Tetrofosmin was injected at peak exercise and myocardial perfusion imaging was performed after a brief delay. Stress ECG: No significant change from baseline ECG  QPS Raw Data Images:  Normal; no motion artifact; normal heart/lung ratio. Stress Images:  Normal homogeneous uptake in all areas of the myocardium. Rest Images:  Normal homogeneous uptake in all areas of the myocardium. Subtraction (SDS):  No evidence of ischemia. Transient Ischemic Dilatation (Normal <1.22):  1.02 Lung/Heart Ratio (Normal <0.45): 0.29   Quantitative Gated Spect Images QGS EDV:  76 ml QGS ESV:  23 ml  Impression Exercise Capacity:  Fair exercise capacity. BP Response:  Hypertensive blood pressure response. Clinical Symptoms:  Fatigue ECG Impression:  No significant ST segment change suggestive of ischemia. Comparison with Prior Nuclear Study: No significant change from previous study  Overall Impression:  Normal stress nuclear study. There is hypertensive response.  LV Ejection Fraction: 70%.  LV Wall Motion:  There is mild relative septal dyssynergy (CABG).  Ricardo Gens Bebe Moncure,MD

## 2011-06-07 ENCOUNTER — Encounter: Payer: Self-pay | Admitting: Cardiovascular Disease

## 2011-06-07 ENCOUNTER — Ambulatory Visit (INDEPENDENT_AMBULATORY_CARE_PROVIDER_SITE_OTHER): Payer: Medicare Other | Admitting: Cardiovascular Disease

## 2011-06-07 VITALS — BP 118/70 | HR 60 | Ht 67.0 in | Wt 162.0 lb

## 2011-06-07 DIAGNOSIS — E119 Type 2 diabetes mellitus without complications: Secondary | ICD-10-CM

## 2011-06-07 DIAGNOSIS — R1084 Generalized abdominal pain: Secondary | ICD-10-CM

## 2011-06-07 DIAGNOSIS — E785 Hyperlipidemia, unspecified: Secondary | ICD-10-CM

## 2011-06-07 DIAGNOSIS — I2510 Atherosclerotic heart disease of native coronary artery without angina pectoris: Secondary | ICD-10-CM

## 2011-06-07 DIAGNOSIS — I10 Essential (primary) hypertension: Secondary | ICD-10-CM

## 2011-06-07 DIAGNOSIS — R0989 Other specified symptoms and signs involving the circulatory and respiratory systems: Secondary | ICD-10-CM

## 2011-06-07 NOTE — Assessment & Plan Note (Signed)
Recent EGD at [facility] with bad gastritis. Continue protonix 40 mg Likely related to recent hospital visit for SSCP

## 2011-06-07 NOTE — Assessment & Plan Note (Signed)
Cath 5/12 with patent grafts  Recent hospital admission for R/O  Pain likely GI in nature.

## 2011-06-07 NOTE — Assessment & Plan Note (Signed)
Discussed low carb diet.  Target hemoglobin A1c is 6.5 or less.  Continue current medications.  

## 2011-06-07 NOTE — Assessment & Plan Note (Signed)
Intolerant to statins Continue gemfibrazol  Labs with Dr Jeannetta Nap

## 2011-06-07 NOTE — Assessment & Plan Note (Signed)
Well controlled.  Continue current medications and low sodium Dash type diet.    

## 2011-06-07 NOTE — Assessment & Plan Note (Signed)
Not heard on exam today. Duplex 7/12 wit h0-39% bilateral disease  F/U duplex 7/14

## 2011-06-07 NOTE — Progress Notes (Signed)
[name] is a 71 year old patient  with known history of coronary artery disease status post coronary artery bypass grafting in 2009 with subsequent cardiac catheterization in May 2012 revealing native multivessel disease with 4 of 4 patent grafts. Recenlty hospitalized a month ago for SSCP  He awoke suddenly on day of admission with chest pain pressure, indigestion, without associated nausea. He was admitted to rule out myocardial infarction. Cardiac enzymes are found to be negative throughout hospitalization. There were no acute ST-T wave changes. He was given GI cocktail, which gave him relief. It was felt that his symptoms were related to GERD. Of note, he has been statin intolerant.  D/C on H2 blocker  ROS: Denies fever, malais, weight loss, blurry vision, decreased visual acuity, cough, sputum, SOB, hemoptysis, pleuritic pain, palpitaitons, heartburn, abdominal pain, melena, lower extremity edema, claudication, or rash.  All other systems reviewed and negative  General: Affect appropriate Healthy:  appears stated age HEENT: normal Neck supple with no adenopathy JVP normal no bruits no thyromegaly Lungs clear with no wheezing and good diaphragmatic motion Heart:  S1/S2 no murmur, no rub, gallop or click PMI normal Abdomen: benighn, BS positve, no tenderness, no AAA no bruit.  No HSM or HJR Distal pulses intact with no bruits No edema Neuro non-focal Skin warm and dry No muscular weakness   Current Outpatient Prescriptions  Medication Sig Dispense Refill  . amLODipine (NORVASC) 5 MG tablet Take 5 mg by mouth daily.        [name] Kitchen aspirin 81 MG EC tablet Take 81 mg by mouth daily.        [name] Kitchen gemfibrozil (LOPID) 600 MG tablet Take 1 tablet (600 mg total) by mouth 2 (two) times daily before a meal.  60 tablet  10  . glipiZIDE (GLUCOTROL) 10 MG tablet Take 10 mg by mouth daily.        . hydrochlorothiazide (MICROZIDE) 12.5 MG capsule Take 1 capsule (12.5 mg total) by mouth daily.  30 capsule  6    . insulin glargine (LANTUS SOLOSTAR) 100 UNIT/ML injection Inject 12 Units into the skin at bedtime.       [name] Kitchen lisinopril-hydrochlorothiazide (PRINZIDE,ZESTORETIC) 20-12.5 MG per tablet Take 1 tablet by mouth daily.        . Multiple Vitamin (MULITIVITAMIN WITH MINERALS) TABS Take 1 tablet by mouth daily.      . nitroGLYCERIN (NITROSTAT) 0.4 MG SL tablet Place 1 tablet (0.4 mg total) under the tongue every 5 (five) minutes x 3 doses as needed for chest pain.  30 tablet  4  . pantoprazole (PROTONIX) 40 MG tablet Take 1 tablet (40 mg total) by mouth 2 (two) times daily.  30 tablet  6  . polyethylene glycol powder (GLYCOLAX/MIRALAX) powder Take 8.5 g by mouth daily as needed. For constipation      . predniSONE (DELTASONE) 10 MG tablet as directed.      [name] Kitchen RELION SHORT PEN NEEDLES 31G X 8 MM MISC as directed.      [name] Kitchen DISCONTD: dicyclomine (BENTYL) 20 MG tablet Take 1 by mouth two times daily as needed for abd. pain       . DISCONTD: metFORMIN (GLUCOPHAGE) 500 MG tablet Take 500 mg by mouth daily.        [name] Kitchen DISCONTD: simvastatin (ZOCOR) 40 MG tablet Take 40 mg by mouth at bedtime.        [name] Kitchen DISCONTD: sucralfate (CARAFATE) 1 GM/10ML suspension 10cc by mouth two times a day  Allergies  Iohexol and Statins  Electrocardiogram:  Assessment and Plan

## 2011-06-07 NOTE — Patient Instructions (Signed)
Your physician wants you to follow-up in: 1 year with Dr. Nishan.  You will receive a reminder letter in the mail two months in advance. If you don't receive a letter, please call our office to schedule the follow-up appointment.  

## 2011-08-22 ENCOUNTER — Ambulatory Visit (INDEPENDENT_AMBULATORY_CARE_PROVIDER_SITE_OTHER): Payer: Medicare Other | Admitting: Ophthalmology

## 2011-08-22 DIAGNOSIS — E11359 Type 2 diabetes mellitus with proliferative diabetic retinopathy without macular edema: Secondary | ICD-10-CM

## 2011-08-22 DIAGNOSIS — H35039 Hypertensive retinopathy, unspecified eye: Secondary | ICD-10-CM

## 2011-08-22 DIAGNOSIS — I10 Essential (primary) hypertension: Secondary | ICD-10-CM

## 2011-08-22 DIAGNOSIS — H43819 Vitreous degeneration, unspecified eye: Secondary | ICD-10-CM

## 2011-08-22 DIAGNOSIS — E1139 Type 2 diabetes mellitus with other diabetic ophthalmic complication: Secondary | ICD-10-CM

## 2011-08-22 DIAGNOSIS — H2510 Age-related nuclear cataract, unspecified eye: Secondary | ICD-10-CM

## 2012-02-05 ENCOUNTER — Ambulatory Visit (INDEPENDENT_AMBULATORY_CARE_PROVIDER_SITE_OTHER): Payer: Medicare Other | Admitting: Ophthalmology

## 2012-02-05 DIAGNOSIS — E1139 Type 2 diabetes mellitus with other diabetic ophthalmic complication: Secondary | ICD-10-CM

## 2012-02-05 DIAGNOSIS — I10 Essential (primary) hypertension: Secondary | ICD-10-CM

## 2012-02-05 DIAGNOSIS — E1165 Type 2 diabetes mellitus with hyperglycemia: Secondary | ICD-10-CM

## 2012-02-05 DIAGNOSIS — E11319 Type 2 diabetes mellitus with unspecified diabetic retinopathy without macular edema: Secondary | ICD-10-CM

## 2012-02-05 DIAGNOSIS — H35039 Hypertensive retinopathy, unspecified eye: Secondary | ICD-10-CM

## 2012-02-05 DIAGNOSIS — H43819 Vitreous degeneration, unspecified eye: Secondary | ICD-10-CM

## 2012-02-22 ENCOUNTER — Ambulatory Visit (INDEPENDENT_AMBULATORY_CARE_PROVIDER_SITE_OTHER): Payer: Medicare Other | Admitting: Ophthalmology

## 2012-03-11 ENCOUNTER — Telehealth: Payer: Self-pay | Admitting: Cardiovascular Disease

## 2012-03-11 DIAGNOSIS — M79671 Pain in right foot: Secondary | ICD-10-CM

## 2012-03-11 DIAGNOSIS — M79672 Pain in left foot: Secondary | ICD-10-CM

## 2012-03-11 NOTE — Telephone Encounter (Signed)
PT'S WIFE AWARE  LOWER ART DOP TO BE SCHEDULED./CY

## 2012-03-11 NOTE — Addendum Note (Signed)
Addended by: Scherrie Bateman E on: 03/11/2012 04:38 PM   Modules accepted: Orders

## 2012-03-11 NOTE — Telephone Encounter (Signed)
No known vascular disease Continue to hold statin Get ABI's and LE arterial duplex this week

## 2012-03-11 NOTE — Telephone Encounter (Signed)
SPOKE WITH PT   RE MESSAGE   PT IS COMPLAINING OF EXTREME PAIN TO BOTH FEET AS WELL AS COLD TO TOUCH  GOING ON 8 DAYS PER PT  COLOR LOOKS OKAY  AND NO SWELLING IN  LEGS OR FEET NOTED  WAS RECENTLY STARTED ON  LOVASTATIN  5 MG  APPROX 3 WEEKS  AGO HAS SINCE STOPPED MED   8-9 DAYS ONCE SYMPTOMS  APPEARED  NO RELIEF  NOTED AT THIS TIME WITH MED CHANGE WILL FORWARD TO DR Eden Emms FOR REVIEW./CY

## 2012-03-11 NOTE — Telephone Encounter (Signed)
New Problem       Pt c/o Severe in both feet, also pt says feet are cold to him. Nothing relieving pain. PCP was not contacted.

## 2012-03-12 ENCOUNTER — Encounter (INDEPENDENT_AMBULATORY_CARE_PROVIDER_SITE_OTHER): Payer: Medicare Other

## 2012-03-12 DIAGNOSIS — M79671 Pain in right foot: Secondary | ICD-10-CM

## 2012-03-12 DIAGNOSIS — M79672 Pain in left foot: Secondary | ICD-10-CM

## 2012-03-12 DIAGNOSIS — I739 Peripheral vascular disease, unspecified: Secondary | ICD-10-CM

## 2012-03-14 ENCOUNTER — Telehealth: Payer: Self-pay | Admitting: Cardiovascular Disease

## 2012-03-14 NOTE — Telephone Encounter (Signed)
Pt had test done this week and wife calling for results

## 2012-03-14 NOTE — Telephone Encounter (Signed)
PT AWARE REPORT NOT REVIEWED AS OF YET WILL CALL ONCE RECEIVE RESULTS .Zack Seal

## 2012-03-18 NOTE — Telephone Encounter (Signed)
PT AWARE OF  LOWER ART DOP RESULTS APPT MADE WITH DR ARIDA FOR 03-20-12 AT 4:00 PM .Ricardo Le

## 2012-03-20 ENCOUNTER — Ambulatory Visit (INDEPENDENT_AMBULATORY_CARE_PROVIDER_SITE_OTHER): Payer: Medicare Other | Admitting: Cardiovascular Disease

## 2012-03-20 ENCOUNTER — Encounter: Payer: Self-pay | Admitting: Cardiovascular Disease

## 2012-03-20 VITALS — BP 162/80 | HR 64 | Wt 163.0 lb

## 2012-03-20 DIAGNOSIS — I739 Peripheral vascular disease, unspecified: Secondary | ICD-10-CM | POA: Insufficient documentation

## 2012-03-20 DIAGNOSIS — R0989 Other specified symptoms and signs involving the circulatory and respiratory systems: Secondary | ICD-10-CM

## 2012-03-20 MED ORDER — CILOSTAZOL 50 MG PO TABS: 50.0000 mg | ORAL_TABLET | Freq: Two times a day (BID) | ORAL | Status: DC

## 2012-03-20 NOTE — Assessment & Plan Note (Signed)
He is due for a carotid duplex ultrasound in June of 2014. Most recent duplex showed mild bilateral disease.

## 2012-03-20 NOTE — Progress Notes (Signed)
Primary care physician: Dr. Jeannetta Le. Primary cardiologist: Dr. Eden Le  HPI  [name] is a 72 year old patient  Who was referred by Dr. Eden Le for evaluation and management of recently diagnosed PAD. He has known history of coronary artery disease status post coronary artery bypass grafting in 2009 with subsequent cardiac catheterization in May 2012 revealing native multivessel disease with 4 of 4 patent grafts.  He reports previous history of pancreatic cancer 3 years ago which was treated successfully. He also reports history of retinal problems. He was told by his ophthalmologist that he cannot take any blood thinners due to risk of bleeding. He has history of intolerance to statins due to myalgia. Recently, he was tried on a statin. After taking the medication, he reported increased foot discomfort and feeling cold in both feet. He underwent evaluation with an ABI lower extremity arterial duplex. His ABI was mildly reduced bilaterally at 0.7 with evidence of a short occlusion and distal SFA bilaterally. The patient is able to exercise on a treadmill daily for 30 minutes. He walks 1 mile without significant claudication. He does complain of very mild discomfort in the bottom of both feet when he is exercising.   Allergies  Allergen Reactions  . Iohexol      Code: HIVES, Desc: hives - pt ok with premedication, Onset Date: [id number]   . Statins     Myalgias      Current Outpatient Prescriptions on File Prior to Visit  Medication Sig Dispense Refill  . amLODipine (NORVASC) 5 MG tablet Take 5 mg by mouth daily.        [name] Kitchen aspirin 81 MG EC tablet Take 81 mg by mouth daily.       [name] Kitchen glipiZIDE (GLUCOTROL) 10 MG tablet Take 10 mg by mouth daily.        . hydrochlorothiazide (MICROZIDE) 12.5 MG capsule Take 1 capsule (12.5 mg total) by mouth daily.  30 capsule  6  . insulin glargine (LANTUS SOLOSTAR) 100 UNIT/ML injection Inject 12 Units into the skin at bedtime.       [name] Kitchen  lisinopril-hydrochlorothiazide (PRINZIDE,ZESTORETIC) 20-12.5 MG per tablet Take 1 tablet by mouth daily.        . Multiple Vitamin (MULITIVITAMIN WITH MINERALS) TABS Take 1 tablet by mouth daily.      . nitroGLYCERIN (NITROSTAT) 0.4 MG SL tablet Place 1 tablet (0.4 mg total) under the tongue every 5 (five) minutes x 3 doses as needed for chest pain.  30 tablet  4  . polyethylene glycol powder (GLYCOLAX/MIRALAX) powder Take 8.5 g by mouth daily as needed. For constipation      . predniSONE (DELTASONE) 10 MG tablet as needed.       [name] Kitchen gemfibrozil (LOPID) 600 MG tablet Take 1 tablet (600 mg total) by mouth 2 (two) times daily before a meal.  60 tablet  10  . [DISCONTINUED] dicyclomine (BENTYL) 20 MG tablet Take 1 by mouth two times daily as needed for abd. pain       . [DISCONTINUED] metFORMIN (GLUCOPHAGE) 500 MG tablet Take 500 mg by mouth daily.        . [DISCONTINUED] simvastatin (ZOCOR) 40 MG tablet Take 40 mg by mouth at bedtime.        . [DISCONTINUED] sucralfate (CARAFATE) 1 GM/10ML suspension 10cc by mouth two times a day          Past Medical History  Diagnosis Date  . Palpitations   . CAD (coronary artery disease)  a. 04/2007 s/p CABG x 4  - LIMA->LAD, VG->OM1->Om2, VG->PDA;  b. 12/2008 & 06/2010 Caths - Native multivessel dzs with 4/4 patent grafts.  . Pure hypercholesterolemia     statin intolerance  . DDD (degenerative disc disease)     several surgeries  . Carotid bruit     a. 07/2010 U/S- 0-39% bilat ICA stenosis  . Nephrolithiasis   . HNP (herniated nucleus pulposus), lumbar     a. L2-3, s/p laminotomy, microdiskectomy 02/2009  . Pancreatic mass     a. 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 Chest pain     Noncardiac probably related to reflux  . Unspecified essential hypertension   . Pneumonia     hx of  pna  . Type II or unspecified type diabetes mellitus without mention of complication, not stated as uncontrolled     insulin dependent  . GERD  (gastroesophageal reflux disease)      Past Surgical History  Procedure Date  . Re-exploratin of laminectomy 05/2008    (RT L2-3) WITH REDO MICRODISKECTOMY  . Cyst removed     FROM SPINE  . Nephrolithotomy     PERCUTANEOUS  . Coronary artery bypass graft     X 4 2009 (LIMA to LAD, SVG to first cicumflex marginal branch with sequential SVG to second cicumflex marginal branch  and saphenous vein to posterior descending coronary artery with endoscopic,vein harvest rt. lower exttremity by Dr.Owen on March 31,2009  . Cystourethroscopy     right retrograde pyelogram,manipulate stone in the renal pelvis, rt. double-j catheter.  . Pars plana vitrectomy     lt. eye,retinal photocoagulation lt. eye, membrane peel lt. eye.  . Cholecystectomy   . Appendectomy   . Arthroscopy right knee   . Back surgery   . Knee surgery     rt knee     Family History  Problem Relation Age of Onset  . Heart attack Father     died of MI @ 18  . Diabetes Brother     type 2  . Other      no fh of colon cancer  . Other Mother     died @ 49  . Coronary artery disease Brother     s/p CABG.  Alive @ 81  . Coronary artery disease Sister     alive     History   Social History  . Marital Status: Married    Spouse Name: N/A    Number of Children: N/A  . Years of Education: N/A   Occupational History  . Not on file.   Social History Main Topics  . Smoking status: Never Smoker   . Smokeless tobacco: Never Used  . Alcohol Use: No  . Drug Use: No  . Sexually Active: Not Currently   Other Topics Concern  . Not on file   Social History Narrative   Lives in [place] with wife.  Retired Optician, dispensing.  Walks 1.61mi daily.  Does not work 2/2 back problems.     PHYSICAL EXAM   BP 162/80  Pulse 64  Wt 163 lb (73.936 kg) Constitutional: He is oriented to person, place, and time. He appears well-developed and well-nourished. No distress.  HENT: No nasal discharge.  Head: Normocephalic and atraumatic.   Eyes: Pupils are equal and round. Right eye exhibits no discharge. Left eye exhibits no discharge.  Neck: Normal range of motion. Neck supple. No JVD present. No thyromegaly present. There is left carotid  bruit. Cardiovascular: Normal rate, regular rhythm, normal heart sounds and. Exam reveals no gallop and no friction rub. No murmur heard.  Pulmonary/Chest: Effort normal and breath sounds normal. No stridor. No respiratory distress. He has no wheezes. He has no rales. He exhibits no tenderness.  Abdominal: Soft. Bowel sounds are normal. He exhibits no distension. There is no tenderness. There is no rebound and no guarding.  Musculoskeletal: Normal range of motion. He exhibits no edema and no tenderness.  Neurological: He is alert and oriented to person, place, and time. Coordination normal.  Skin: Skin is warm and dry. No rash noted. He is not diaphoretic. No erythema. No pallor.  Psychiatric: He has a normal mood and affect. His behavior is normal. Judgment and thought content normal.  vascular: Femoral pulses are normal bilaterally . Distal pulses are not palpable.      ASSESSMENT AND PLAN

## 2012-03-20 NOTE — Patient Instructions (Addendum)
Your physician recommends that you schedule a follow-up appointment in: 3 months with Dr Kirke Corin  Your physician has recommended you make the following change in your medication: START Pletal 50 mg twice daily

## 2012-03-20 NOTE — Assessment & Plan Note (Signed)
The patient has evidence of PAD with very mild non-lifestyle limiting claudication. He is able to exercise daily for 30 minutes with only minor discomfort. There is no signs of critical limb ischemia. His feet actually feel warm. I explained to him that his current symptoms are unlikely to be related to recent exposure to a statin and most likely due to pre-existing peripheral arterial disease. Given that his symptoms are very mild, I recommend continuing exercise program. I will also add a small dose Pletal 50 mg twice daily. I asked him to check with his ophthalmologist to make sure that it is okay given that he was told previously not to use blood thinners. Pletal is technically not on blood thinner but it does have a mild antiplatelet effect.

## 2012-03-21 ENCOUNTER — Telehealth: Payer: Self-pay

## 2012-03-21 NOTE — Telephone Encounter (Signed)
Message copied by Zola Button on Thu Mar 21, 2012  4:32 PM ------      Message from: Lorine Bears A      Created: Thu Mar 21, 2012  3:37 PM       I spoke with his Ophthalmologist. He is ok with him taking Pletal.

## 2012-03-21 NOTE — Telephone Encounter (Signed)
Pt was notified of this 

## 2012-06-24 ENCOUNTER — Telehealth: Payer: Self-pay | Admitting: Cardiovascular Disease

## 2012-06-24 NOTE — Telephone Encounter (Signed)
i spoke with wife who says they never started pletal and does not want to start this medication since legs and feet are "doing great" and he is able to walk on treadmill daily without pain I told wife I would let Dr. Kirke Corin know but the Pletal is to add a little more protection for Ricardo Le She understands but would rather ne not take this  Also wanted to let us know Dr. Eden Emms stopped the gemfibrozil and lovastatin

## 2012-06-24 NOTE — Telephone Encounter (Signed)
Ok that's fine. If he is not having leg pain with walking/exercsie then he does not need to take Pletal.

## 2012-06-24 NOTE — Telephone Encounter (Signed)
I spoke with pt who says he went to have his meds refilled 1 month ago and was told they could not fill one of his RX d/t "severe" interaction with another one of his medications He cannot remember name of meds I told him I would call pharmacy and call him back  I spoke with pharmacist who says pt is taking omeprazole and Pletal, which each have a "small reaction" but is also taking gemfibrozil and lovastatin which is considered a "severe" reaction.  Neither of these meds are on pt's med list in our records  I will send to Dr. Kirke Corin to see if we need to make any changes with pts meds [id number]

## 2012-06-24 NOTE — Telephone Encounter (Signed)
New problem    On ometrazole 20 mg .   Prescribe a new medication . Pharmacy was told both medication should not be taken together.

## 2012-06-24 NOTE — Telephone Encounter (Signed)
Omeprazole and Pletal are ok as long as the dose of Pletal is 50 mg bid and not 100. I did not prescribe the other 2 medications .

## 2012-06-25 ENCOUNTER — Ambulatory Visit: Payer: Medicare Other | Admitting: Cardiovascular Disease

## 2012-06-25 NOTE — Telephone Encounter (Signed)
pts wife informed.

## 2012-06-28 ENCOUNTER — Other Ambulatory Visit: Payer: Self-pay | Admitting: Family Medicine

## 2012-07-24 ENCOUNTER — Encounter: Payer: Self-pay | Admitting: Cardiovascular Disease

## 2012-08-05 ENCOUNTER — Ambulatory Visit (INDEPENDENT_AMBULATORY_CARE_PROVIDER_SITE_OTHER): Payer: Medicare Other | Admitting: Ophthalmology

## 2012-08-05 DIAGNOSIS — E1139 Type 2 diabetes mellitus with other diabetic ophthalmic complication: Secondary | ICD-10-CM

## 2012-08-05 DIAGNOSIS — I10 Essential (primary) hypertension: Secondary | ICD-10-CM

## 2012-08-05 DIAGNOSIS — H35039 Hypertensive retinopathy, unspecified eye: Secondary | ICD-10-CM

## 2012-08-05 DIAGNOSIS — H43819 Vitreous degeneration, unspecified eye: Secondary | ICD-10-CM

## 2012-08-05 DIAGNOSIS — E11359 Type 2 diabetes mellitus with proliferative diabetic retinopathy without macular edema: Secondary | ICD-10-CM

## 2012-08-05 DIAGNOSIS — E1165 Type 2 diabetes mellitus with hyperglycemia: Secondary | ICD-10-CM

## 2012-12-17 ENCOUNTER — Ambulatory Visit: Payer: Medicare Other | Admitting: Podiatrist

## 2013-01-23 ENCOUNTER — Encounter: Payer: Self-pay | Admitting: Cardiovascular Disease

## 2013-01-23 ENCOUNTER — Ambulatory Visit (INDEPENDENT_AMBULATORY_CARE_PROVIDER_SITE_OTHER): Payer: Medicare Other | Admitting: Cardiovascular Disease

## 2013-01-23 VITALS — BP 130/60 | HR 60 | Ht 68.0 in | Wt 167.0 lb

## 2013-01-23 DIAGNOSIS — E785 Hyperlipidemia, unspecified: Secondary | ICD-10-CM

## 2013-01-23 DIAGNOSIS — I10 Essential (primary) hypertension: Secondary | ICD-10-CM

## 2013-01-23 DIAGNOSIS — I200 Unstable angina: Secondary | ICD-10-CM

## 2013-01-23 DIAGNOSIS — R002 Palpitations: Secondary | ICD-10-CM

## 2013-01-23 DIAGNOSIS — I2510 Atherosclerotic heart disease of native coronary artery without angina pectoris: Secondary | ICD-10-CM

## 2013-01-23 DIAGNOSIS — E119 Type 2 diabetes mellitus without complications: Secondary | ICD-10-CM

## 2013-01-23 DIAGNOSIS — R0989 Other specified symptoms and signs involving the circulatory and respiratory systems: Secondary | ICD-10-CM

## 2013-01-23 NOTE — Assessment & Plan Note (Signed)
Well controlled.  Continue current medications and low sodium Dash type diet.    

## 2013-01-23 NOTE — Assessment & Plan Note (Signed)
To see Arida PRN  ABI;s .7 range Would need surgery if symptoms worsen with total L SFA.  Walking program Discussed foot care

## 2013-01-23 NOTE — Assessment & Plan Note (Signed)
Stable with no angina and good activity level.  Continue medical Rx  

## 2013-01-23 NOTE — Patient Instructions (Signed)
Your physician has requested that you have a carotid duplex. This test is an ultrasound of the carotid arteries in your neck. It looks at blood flow through these arteries that supply the brain with blood. Allow one hour for this exam. There are no restrictions or special instructions.    Your physician wants you to follow-up in: 1 year with Dr. Haywood Filler will receive a reminder letter in the mail two months in advance. If you don't receive a letter, please call our office to schedule the follow-up appointment.   Your physician recommends that you continue on your current medications as directed. Please refer to the Current Medication list given to you today.

## 2013-01-23 NOTE — Assessment & Plan Note (Signed)
ASA  F/U duplex no critical disease 2 years ago

## 2013-01-23 NOTE — Addendum Note (Signed)
Addended by: Kem Parkinson on: 01/23/2013 08:52 AM   Modules accepted: Orders

## 2013-01-23 NOTE — Progress Notes (Signed)
Patient ID: Ricardo Le, male   DOB: [birth year], 72 y.o.   MRN: [id number] [name] is a 72 year old patient with known history of coronary artery disease status post coronary artery bypass grafting in 2009 with subsequent cardiac catheterization in May 2012 revealing native multivessel disease with 4 of 4 patent grafts. PVD followed by Ricardo Le on Pletal for mild claudication and walking program Could not take pletal due to H2 blocker  Of note, he has been statin intolerant. D/C on H2 blocker  10/14 ABI's .72 right and .70 left Carotids 2012 0-39%   ROS: Denies fever, malais, weight loss, blurry vision, decreased visual acuity, cough, sputum, SOB, hemoptysis, pleuritic pain, palpitaitons, heartburn, abdominal pain, melena, lower extremity edema, claudication, or rash.  All other systems reviewed and negative  General: Affect appropriate Healthy:  appears stated age HEENT: normal Neck supple with no adenopathy JVP normal bilateral  bruits no thyromegaly Lungs clear with no wheezing and good diaphragmatic motion Heart:  S1/S2 no murmur, no rub, gallop or click PMI normal Abdomen: benighn, BS positve, no tenderness, no AAA no bruit.  No HSM or HJR Distal pulses intact Plus one DP bilaterally  No edema Neuro non-focal Skin warm and dry No muscular weakness   Current Outpatient Prescriptions  Medication Sig Dispense Refill  . amLODipine (NORVASC) 5 MG tablet Take 5 mg by mouth daily.        . cilostazol (PLETAL) 50 MG tablet Take 1 tablet (50 mg total) by mouth 2 (two) times daily.  60 tablet  6  . glipiZIDE (GLUCOTROL) 10 MG tablet Take 10 mg by mouth daily.        . insulin glargine (LANTUS SOLOSTAR) 100 UNIT/ML injection Inject 12 Units into the skin at bedtime.       . Multiple Vitamin (MULITIVITAMIN WITH MINERALS) TABS Take 1 tablet by mouth daily.      . polyethylene glycol powder (GLYCOLAX/MIRALAX) powder Take 8.5 g by mouth daily as needed. For constipation      .  predniSONE (DELTASONE) 10 MG tablet as needed.       [name] Kitchen aspirin 81 MG EC tablet Take 81 mg by mouth daily.       . ciprofloxacin (CIPRO) 500 MG tablet As directed      . lisinopril-hydrochlorothiazide (PRINZIDE,ZESTORETIC) 20-12.5 MG per tablet Take 1 tablet by mouth daily.        [name] Kitchen omeprazole (PRILOSEC) 20 MG capsule 1 tab daily      . tamsulosin (FLOMAX) 0.4 MG CAPS capsule i tab daily      . ZETIA 10 MG tablet 1 tab daily      . [DISCONTINUED] dicyclomine (BENTYL) 20 MG tablet Take 1 by mouth two times daily as needed for abd. pain       . [DISCONTINUED] metFORMIN (GLUCOPHAGE) 500 MG tablet Take 500 mg by mouth daily.        . [DISCONTINUED] simvastatin (ZOCOR) 40 MG tablet Take 40 mg by mouth at bedtime.        . [DISCONTINUED] sucralfate (CARAFATE) 1 GM/10ML suspension 10cc by mouth two times a day        No current facility-administered medications for this visit.    Allergies  Iohexol and Statins  Electrocardiogram:  SR rate 60 LAD poor R wave progression   Assessment and Plan

## 2013-01-23 NOTE — Assessment & Plan Note (Signed)
Cholesterol is at goal.  Continue current dose of statin and diet Rx.  No myalgias or side effects.  F/U  LFT's in 6 months. Lab Results  Component Value Date   LDLCALC 105* 05/06/2011   Intolerant to statins on Zetia

## 2013-01-23 NOTE — Assessment & Plan Note (Signed)
Discussed low carb diet.  Target hemoglobin A1c is 6.5 or less.  Continue current medications.  

## 2013-01-31 ENCOUNTER — Ambulatory Visit: Payer: Medicare Other | Admitting: Cardiovascular Disease

## 2013-02-10 ENCOUNTER — Ambulatory Visit (INDEPENDENT_AMBULATORY_CARE_PROVIDER_SITE_OTHER): Payer: Medicare Other | Admitting: Ophthalmology

## 2013-02-10 DIAGNOSIS — I10 Essential (primary) hypertension: Secondary | ICD-10-CM

## 2013-02-10 DIAGNOSIS — E11359 Type 2 diabetes mellitus with proliferative diabetic retinopathy without macular edema: Secondary | ICD-10-CM

## 2013-02-10 DIAGNOSIS — H35039 Hypertensive retinopathy, unspecified eye: Secondary | ICD-10-CM

## 2013-02-10 DIAGNOSIS — E1139 Type 2 diabetes mellitus with other diabetic ophthalmic complication: Secondary | ICD-10-CM

## 2013-02-10 DIAGNOSIS — H43819 Vitreous degeneration, unspecified eye: Secondary | ICD-10-CM

## 2013-02-12 ENCOUNTER — Ambulatory Visit (HOSPITAL_COMMUNITY): Payer: Medicare Other | Attending: Cardiology

## 2013-02-12 ENCOUNTER — Encounter: Payer: Self-pay | Admitting: Cardiology

## 2013-02-12 DIAGNOSIS — E119 Type 2 diabetes mellitus without complications: Secondary | ICD-10-CM | POA: Insufficient documentation

## 2013-02-12 DIAGNOSIS — E785 Hyperlipidemia, unspecified: Secondary | ICD-10-CM | POA: Insufficient documentation

## 2013-02-12 DIAGNOSIS — R0989 Other specified symptoms and signs involving the circulatory and respiratory systems: Secondary | ICD-10-CM

## 2013-02-12 DIAGNOSIS — I6529 Occlusion and stenosis of unspecified carotid artery: Secondary | ICD-10-CM

## 2013-02-12 DIAGNOSIS — I10 Essential (primary) hypertension: Secondary | ICD-10-CM | POA: Insufficient documentation

## 2013-02-12 DIAGNOSIS — I2510 Atherosclerotic heart disease of native coronary artery without angina pectoris: Secondary | ICD-10-CM | POA: Insufficient documentation

## 2013-02-12 DIAGNOSIS — I739 Peripheral vascular disease, unspecified: Secondary | ICD-10-CM | POA: Insufficient documentation

## 2013-02-12 DIAGNOSIS — I658 Occlusion and stenosis of other precerebral arteries: Secondary | ICD-10-CM | POA: Insufficient documentation

## 2013-08-12 ENCOUNTER — Ambulatory Visit (INDEPENDENT_AMBULATORY_CARE_PROVIDER_SITE_OTHER): Payer: Medicare Other | Admitting: Ophthalmology

## 2013-08-12 DIAGNOSIS — E11359 Type 2 diabetes mellitus with proliferative diabetic retinopathy without macular edema: Secondary | ICD-10-CM

## 2013-08-12 DIAGNOSIS — H35039 Hypertensive retinopathy, unspecified eye: Secondary | ICD-10-CM

## 2013-08-12 DIAGNOSIS — I10 Essential (primary) hypertension: Secondary | ICD-10-CM

## 2013-08-12 DIAGNOSIS — E1139 Type 2 diabetes mellitus with other diabetic ophthalmic complication: Secondary | ICD-10-CM

## 2013-08-12 DIAGNOSIS — E1165 Type 2 diabetes mellitus with hyperglycemia: Secondary | ICD-10-CM

## 2013-08-12 DIAGNOSIS — H43819 Vitreous degeneration, unspecified eye: Secondary | ICD-10-CM

## 2014-01-19 ENCOUNTER — Ambulatory Visit (INDEPENDENT_AMBULATORY_CARE_PROVIDER_SITE_OTHER): Payer: Medicare Other | Admitting: Ophthalmology

## 2014-01-19 DIAGNOSIS — E11319 Type 2 diabetes mellitus with unspecified diabetic retinopathy without macular edema: Secondary | ICD-10-CM

## 2014-01-19 DIAGNOSIS — I10 Essential (primary) hypertension: Secondary | ICD-10-CM

## 2014-01-19 DIAGNOSIS — H43811 Vitreous degeneration, right eye: Secondary | ICD-10-CM

## 2014-01-19 DIAGNOSIS — E11359 Type 2 diabetes mellitus with proliferative diabetic retinopathy without macular edema: Secondary | ICD-10-CM

## 2014-01-19 DIAGNOSIS — H35033 Hypertensive retinopathy, bilateral: Secondary | ICD-10-CM

## 2014-01-26 NOTE — Progress Notes (Signed)
Patient ID: Ricardo Le, male   DOB: [birth year], 73 y.o.   MRN: [id number] [name] is a 73 year old patient with known history of coronary artery disease status post coronary artery bypass grafting in 2009 with subsequent cardiac catheterization in May 2012 revealing native multivessel disease with 4 of 4 patent grafts. PVD followed by Ricardo Le on Pletal for mild claudication and walking program Could not take pletal due to H2 blocker Of note, he has been statin intolerant. D/C on H2 blocker  10/14 ABI's .72 right and .70 left total left SFA with collaterals  Carotids 12/14  0-39%   ROS: Denies fever, malais, weight loss, blurry vision, decreased visual acuity, cough, sputum, SOB, hemoptysis, pleuritic pain, palpitaitons, heartburn, abdominal pain, melena, lower extremity edema, claudication, or rash.  All other systems reviewed and negative  General: Affect appropriate Healthy:  appears stated age 73: normal Neck supple with no adenopathy JVP normal bilateral  bruits no thyromegaly Lungs clear with no wheezing and good diaphragmatic motion Heart:  S1/S2 no murmur, no rub, gallop or click PMI normal Abdomen: benighn, BS positve, no tenderness, no AAA no bruit.  No HSM or HJR Distal pulses decreased below knee on left  No edema Neuro non-focal Skin warm and dry No muscular weakness   Current Outpatient Prescriptions  Medication Sig Dispense Refill  . amLODipine (NORVASC) 5 MG tablet Take 5 mg by mouth daily.      [name] Kitchen aspirin 81 MG EC tablet Take 81 mg by mouth daily.     . cilostazol (PLETAL) 50 MG tablet Take 1 tablet (50 mg total) by mouth 2 (two) times daily. 60 tablet 6  . ciprofloxacin (CIPRO) 500 MG tablet As directed    . glipiZIDE (GLUCOTROL) 10 MG tablet Take 10 mg by mouth daily.      . insulin glargine (LANTUS SOLOSTAR) 100 UNIT/ML injection Inject 12 Units into the skin at bedtime.     [name] Kitchen lisinopril-hydrochlorothiazide (PRINZIDE,ZESTORETIC) 20-12.5 MG per  tablet Take 1 tablet by mouth daily.      . Multiple Vitamin (MULITIVITAMIN WITH MINERALS) TABS Take 1 tablet by mouth daily.    [name] Kitchen omeprazole (PRILOSEC) 20 MG capsule 1 tab daily    . polyethylene glycol powder (GLYCOLAX/MIRALAX) powder Take 8.5 g by mouth daily as needed. For constipation    . predniSONE (DELTASONE) 10 MG tablet as needed.     . tamsulosin (FLOMAX) 0.4 MG CAPS capsule i tab daily    . ZETIA 10 MG tablet 1 tab daily    . [DISCONTINUED] dicyclomine (BENTYL) 20 MG tablet Take 1 by mouth two times daily as needed for abd. pain     . [DISCONTINUED] metFORMIN (GLUCOPHAGE) 500 MG tablet Take 500 mg by mouth daily.      . [DISCONTINUED] simvastatin (ZOCOR) 40 MG tablet Take 40 mg by mouth at bedtime.      . [DISCONTINUED] sucralfate (CARAFATE) 1 GM/10ML suspension 10cc by mouth two times a day      No current facility-administered medications for this visit.    Allergies  Iohexol and Statins  Electrocardiogram: 12/14 SR rate 65 LAD possible old anterior MI  12/15 SR rate 58 LAD   Assessment and Plan

## 2014-01-27 ENCOUNTER — Ambulatory Visit (INDEPENDENT_AMBULATORY_CARE_PROVIDER_SITE_OTHER): Payer: Medicare Other | Admitting: Cardiovascular Disease

## 2014-01-27 ENCOUNTER — Encounter: Payer: Self-pay | Admitting: Cardiovascular Disease

## 2014-01-27 VITALS — BP 135/68 | HR 58 | Ht 67.0 in | Wt 164.8 lb

## 2014-01-27 DIAGNOSIS — I2510 Atherosclerotic heart disease of native coronary artery without angina pectoris: Secondary | ICD-10-CM

## 2014-01-27 DIAGNOSIS — E1159 Type 2 diabetes mellitus with other circulatory complications: Secondary | ICD-10-CM

## 2014-01-27 DIAGNOSIS — R002 Palpitations: Secondary | ICD-10-CM

## 2014-01-27 DIAGNOSIS — E1151 Type 2 diabetes mellitus with diabetic peripheral angiopathy without gangrene: Secondary | ICD-10-CM

## 2014-01-27 DIAGNOSIS — R0989 Other specified symptoms and signs involving the circulatory and respiratory systems: Secondary | ICD-10-CM

## 2014-01-27 DIAGNOSIS — I739 Peripheral vascular disease, unspecified: Secondary | ICD-10-CM

## 2014-01-27 DIAGNOSIS — E785 Hyperlipidemia, unspecified: Secondary | ICD-10-CM

## 2014-01-27 DIAGNOSIS — I10 Essential (primary) hypertension: Secondary | ICD-10-CM

## 2014-01-27 NOTE — Assessment & Plan Note (Signed)
Well controlled.  Continue current medications and low sodium Dash type diet.    

## 2014-01-27 NOTE — Assessment & Plan Note (Signed)
Stable with no angina and good activity level.  Continue medical Rx CABG 2009 patent grafts cath in 2012

## 2014-01-27 NOTE — Assessment & Plan Note (Signed)
Total left SFA with collaterals  Stable mild claudication on pletal  F/U Dr [name] in 3 months

## 2014-01-27 NOTE — Assessment & Plan Note (Signed)
Cholesterol is at goal.  Continue current dose of statin and diet Rx.  No myalgias or side effects.  F/U  LFT's in 6 months. Lab Results  Component Value Date   LDLCALC 105* 05/06/2011

## 2014-01-27 NOTE — Assessment & Plan Note (Signed)
Duplex 12/14 0-39% bilateral disease f/u duplex in a year

## 2014-01-27 NOTE — Assessment & Plan Note (Signed)
Discussed low carb diet.  Target hemoglobin A1c is 6.5 or less.  Continue current medications.  

## 2014-01-27 NOTE — Patient Instructions (Signed)
Your physician recommends that you schedule a follow-up appointment in:   Ricardo Le Your physician recommends that you continue on your current medications as directed. Please refer to the Current Medication list given to you today.

## 2014-02-16 ENCOUNTER — Ambulatory Visit (INDEPENDENT_AMBULATORY_CARE_PROVIDER_SITE_OTHER): Payer: Medicare Other | Admitting: Ophthalmology

## 2014-03-25 DIAGNOSIS — M542 Cervicalgia: Secondary | ICD-10-CM | POA: Insufficient documentation

## 2014-04-14 DIAGNOSIS — I739 Peripheral vascular disease, unspecified: Secondary | ICD-10-CM

## 2014-04-14 HISTORY — DX: Peripheral vascular disease, unspecified: I73.9

## 2014-05-04 ENCOUNTER — Telehealth: Payer: Self-pay | Admitting: Cardiovascular Disease

## 2014-05-04 ENCOUNTER — Other Ambulatory Visit: Payer: Self-pay | Admitting: Radiology

## 2014-05-04 DIAGNOSIS — I739 Peripheral vascular disease, unspecified: Secondary | ICD-10-CM

## 2014-05-04 NOTE — Telephone Encounter (Signed)
Ok. Thanks!

## 2014-05-04 NOTE — Telephone Encounter (Signed)
New Message  Pt wife calling about appt w/ Arida. Johnsie Cancel is gen cardiologist, but pt wife was wanting Arida to handle his ongoing pain in leg. Pt wife requesting to speak w/ Rn about pt's condition, please call back and discuss.

## 2014-05-04 NOTE — Telephone Encounter (Signed)
Pt last seen by Dr [name] 03/20/12. I spoke with the pt and his wife and the pt is complaining of pain in his legs (left greater than right) for 1-2 weeks.  The pt thought the pain was coming from his back because it was so severe so he was evaluated by Dr Trenton Gammon.  The pt had an MRI performed and was told no abnormality seen.  The pt denies any ulcers or sores on his legs.  The pt's legs are normal color and are not cold to touch.  I will arrange lower arterial duplex for the pt since this was last performed in 2014. Patient scheduled for lower arterial on 05/06/14 and appointment with Dr [name] 05/12/14. Pt aware of appointments.

## 2014-05-06 ENCOUNTER — Ambulatory Visit (HOSPITAL_COMMUNITY): Payer: PPO | Attending: Cardiology | Admitting: Cardiology

## 2014-05-06 DIAGNOSIS — I739 Peripheral vascular disease, unspecified: Secondary | ICD-10-CM | POA: Diagnosis not present

## 2014-05-06 NOTE — Progress Notes (Signed)
ABI and lower arterial bilateral duplex performed

## 2014-05-12 ENCOUNTER — Encounter: Payer: Self-pay | Admitting: Cardiovascular Disease

## 2014-05-12 ENCOUNTER — Encounter: Payer: Self-pay | Admitting: *Deleted

## 2014-05-12 ENCOUNTER — Ambulatory Visit (INDEPENDENT_AMBULATORY_CARE_PROVIDER_SITE_OTHER): Payer: PPO | Admitting: Cardiovascular Disease

## 2014-05-12 VITALS — BP 130/52 | HR 66 | Ht 67.0 in | Wt 161.0 lb

## 2014-05-12 DIAGNOSIS — I2510 Atherosclerotic heart disease of native coronary artery without angina pectoris: Secondary | ICD-10-CM | POA: Diagnosis not present

## 2014-05-12 DIAGNOSIS — I739 Peripheral vascular disease, unspecified: Secondary | ICD-10-CM | POA: Diagnosis not present

## 2014-05-12 DIAGNOSIS — I10 Essential (primary) hypertension: Secondary | ICD-10-CM | POA: Diagnosis not present

## 2014-05-12 LAB — PROTIME-INR
INR: 1.1 ratio — ABNORMAL HIGH (ref 0.8–1.0)
Prothrombin Time: 11.8 s (ref 9.6–13.1)

## 2014-05-12 LAB — CBC WITH DIFFERENTIAL/PLATELET
Basophils Absolute: 0.1 10*3/uL (ref 0.0–0.1)
Basophils Relative: 0.6 % (ref 0.0–3.0)
Eosinophils Absolute: 0.2 10*3/uL (ref 0.0–0.7)
Eosinophils Relative: 2.2 % (ref 0.0–5.0)
HCT: 46.4 % (ref 39.0–52.0)
Hemoglobin: 15.9 g/dL (ref 13.0–17.0)
Lymphocytes Relative: 26.2 % (ref 12.0–46.0)
Lymphs Abs: 2.8 10*3/uL (ref 0.7–4.0)
MCHC: 34.2 g/dL (ref 30.0–36.0)
MCV: 92.9 fl (ref 78.0–100.0)
Monocytes Absolute: 1.6 10*3/uL — ABNORMAL HIGH (ref 0.1–1.0)
Monocytes Relative: 14.7 % — ABNORMAL HIGH (ref 3.0–12.0)
Neutro Abs: 6 10*3/uL (ref 1.4–7.7)
Neutrophils Relative %: 56.3 % (ref 43.0–77.0)
Platelets: 305 10*3/uL (ref 150.0–400.0)
RBC: 5 Mil/uL (ref 4.22–5.81)
RDW: 13.4 % (ref 11.5–15.5)
WBC: 10.6 10*3/uL — ABNORMAL HIGH (ref 4.0–10.5)

## 2014-05-12 LAB — BASIC METABOLIC PANEL
BUN: 22 mg/dL (ref 6–23)
CO2: 33 mEq/L — ABNORMAL HIGH (ref 19–32)
Calcium: 10 mg/dL (ref 8.4–10.5)
Chloride: 98 mEq/L (ref 96–112)
Creatinine, Ser: 1.04 mg/dL (ref 0.40–1.50)
GFR: 74.49 mL/min (ref >60.00)
Glucose, Bld: 186 mg/dL — ABNORMAL HIGH (ref 70–99)
Potassium: 4.3 mEq/L (ref 3.5–5.1)
Sodium: 137 mEq/L (ref 135–145)

## 2014-05-12 MED ORDER — PREDNISONE 20 MG PO TABS: ORAL_TABLET | ORAL | Status: DC

## 2014-05-12 NOTE — Assessment & Plan Note (Signed)
He reports negative workup of dyspnea last month at [facility].

## 2014-05-12 NOTE — Assessment & Plan Note (Signed)
The patient now has severe left lower back and thigh claudication likely due to inflow disease. However, he might also have another component causing his discomfort especially the positional type. Noninvasive evaluation showed a drop in his ABI on the left side to 0.54 with evidence of inflow disease. The patient feels extremely limited by this at the present time. Thus, I recommend proceeding with abdominal aortogram with lower extremity runoff and possible endovascular intervention. Risks, benefits and alternatives were discussed with the patient. I explained to him that if stenting is performed, then treatment with Plavix is indicated for a month or at least 2 weeks. I will prep him for dye allergy.

## 2014-05-12 NOTE — Progress Notes (Signed)
Primary care physician: Dr. Lin Landsman Primary cardiologist: Dr. Johnsie Cancel  HPI  [name] is a 74 year old patient who is here today for a follow up visit regarding PAD. He has known history of coronary artery disease status post coronary artery bypass grafting in 2009 with subsequent cardiac catheterization in May 2012 revealing native multivessel disease with 4 of 4 patent grafts.  He reports previous history of pancreatic cancer 5 years ago which was treated successfully. He also reports history of retinal problems. He was told by his ophthalmologist that he should avoid blood thinners due to risk of bleeding. He has history of intolerance to statins due to myalgia. He was seen 2 years ago for mild bilateral calf claudication. His ABI was mildly reduced bilaterally at 0.7 with evidence of a short occlusion and distal SFA bilaterally.  He was not very limited at that time and was treated medically. He did not tolerate cilostazol. Over the last month however, he started having severe left lower back pain radiating to his thigh with minimal walking. It's occasionally worse with certain movements. He was thought to have back problems but he underwent an MRI which was unremarkable. He is very limited by this at the present time. He also reports being hospitalized at [facility] one month ago for shortness of breath with negative workup.  Allergies  Allergen Reactions  . Iohexol      Code: HIVES, Desc: hives - pt ok with premedication, Onset Date: [id number]   . Statins     Myalgias      Current Outpatient Prescriptions on File Prior to Visit  Medication Sig Dispense Refill  . amLODipine (NORVASC) 5 MG tablet Take 5 mg by mouth daily.      [name] Kitchen aspirin 81 MG EC tablet Take 81 mg by mouth daily.     [name] Kitchen glipiZIDE (GLUCOTROL) 10 MG tablet Take 10 mg by mouth daily.      . insulin glargine (LANTUS SOLOSTAR) 100 UNIT/ML injection Inject 12 Units into the skin at bedtime.     [name] Kitchen  lisinopril-hydrochlorothiazide (PRINZIDE,ZESTORETIC) 20-12.5 MG per tablet Take 1 tablet by mouth daily.      . Multiple Vitamin (MULITIVITAMIN WITH MINERALS) TABS Take 1 tablet by mouth daily.    [name] Kitchen omeprazole (PRILOSEC) 20 MG capsule 1 tab daily    . polyethylene glycol powder (GLYCOLAX/MIRALAX) powder Take 8.5 g by mouth daily as needed. For constipation    . predniSONE (DELTASONE) 10 MG tablet as needed.     . tamsulosin (FLOMAX) 0.4 MG CAPS capsule i tab daily    . [DISCONTINUED] dicyclomine (BENTYL) 20 MG tablet Take 1 by mouth two times daily as needed for abd. pain     . [DISCONTINUED] metFORMIN (GLUCOPHAGE) 500 MG tablet Take 500 mg by mouth daily.      . [DISCONTINUED] simvastatin (ZOCOR) 40 MG tablet Take 40 mg by mouth at bedtime.      . [DISCONTINUED] sucralfate (CARAFATE) 1 GM/10ML suspension 10cc by mouth two times a day      No current facility-administered medications on file prior to visit.     Past Medical History  Diagnosis Date  . Palpitations   . CAD (coronary artery disease)     a. 04/2007 s/p CABG x 4  - LIMA->LAD, VG->OM1->Om2, VG->PDA;  b. 12/2008 & 06/2010 Caths - Native multivessel dzs with 4/4 patent grafts.  . Pure hypercholesterolemia     statin intolerance  . DDD (degenerative disc disease)  several surgeries  . Carotid bruit     a. 07/2010 U/S- 0-39% bilat ICA stenosis  . Nephrolithiasis   . HNP (herniated nucleus pulposus), lumbar     a. L2-3, s/p laminotomy, microdiskectomy 02/2009  . Pancreatic mass     a. 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 Chest pain     Noncardiac probably related to reflux  . Unspecified essential hypertension   . Pneumonia     hx of  pna  . Type II or unspecified type diabetes mellitus without mention of complication, not stated as uncontrolled     insulin dependent  . GERD (gastroesophageal reflux disease)      Past Surgical History  Procedure Laterality Date  . Re-exploratin of laminectomy   05/2008    (RT L2-3) WITH REDO MICRODISKECTOMY  . Cyst removed      FROM SPINE  . Nephrolithotomy      PERCUTANEOUS  . Coronary artery bypass graft      X 4 2009 (LIMA to LAD, SVG to first cicumflex marginal branch with sequential SVG to second cicumflex marginal branch  and saphenous vein to posterior descending coronary artery with endoscopic,vein harvest rt. lower exttremity by Dr.Owen on March 31,2009  . Cystourethroscopy      right retrograde pyelogram,manipulate stone in the renal pelvis, rt. double-j catheter.  . Pars plana vitrectomy      lt. eye,retinal photocoagulation lt. eye, membrane peel lt. eye.  . Cholecystectomy    . Appendectomy    . Arthroscopy right knee    . Back surgery    . Knee surgery      rt knee     Family History  Problem Relation Age of Onset  . Heart attack Father     died of MI @ 77  . Diabetes Brother     type 2  . Other      no fh of colon cancer  . Other Mother     died @ 105  . Coronary artery disease Brother     s/p CABG.  Alive @ 69  . Coronary artery disease Sister     alive     History   Social History  . Marital Status: Married    Spouse Name: N/A  . Number of Children: N/A  . Years of Education: N/A   Occupational History  . Not on file.   Social History Main Topics  . Smoking status: Never Smoker   . Smokeless tobacco: Never Used  . Alcohol Use: No  . Drug Use: No  . Sexual Activity: Not Currently   Other Topics Concern  . Not on file   Social History Narrative   Lives in [place] with wife.  Retired Company secretary.  Walks 1.95mi daily.  Does not work 2/2 back problems.   A 10 point review of system was performed. It is negative other than that mentioned in the history of present illness.   PHYSICAL EXAM   BP 130/52 mmHg  Pulse 66  Ht 5\' 7"  (1.702 m)  Wt 161 lb (73.029 kg)  BMI 25.21 kg/m2  SpO2 97% Constitutional: He is oriented to person, place, and time. He appears well-developed and well-nourished. No  distress.  HENT: No nasal discharge.  Head: Normocephalic and atraumatic.  Eyes: Pupils are equal and round. Right eye exhibits no discharge. Left eye exhibits no discharge.  Neck: Normal range of motion. Neck supple. No JVD present. No thyromegaly present. There is left  carotid bruit. Cardiovascular: Normal rate, regular rhythm, normal heart sounds and. Exam reveals no gallop and no friction rub. No murmur heard.  Pulmonary/Chest: Effort normal and breath sounds normal. No stridor. No respiratory distress. He has no wheezes. He has no rales. He exhibits no tenderness.  Abdominal: Soft. Bowel sounds are normal. He exhibits no distension. There is no tenderness. There is no rebound and no guarding.  Musculoskeletal: Normal range of motion. He exhibits no edema and no tenderness.  Neurological: He is alert and oriented to person, place, and time. Coordination normal.  Skin: Skin is warm and dry. No rash noted. He is not diaphoretic. No erythema. No pallor.  Psychiatric: He has a normal mood and affect. His behavior is normal. Judgment and thought content normal.  vascular: Femoral pulses : +2 on the right side and +1 on the left side . Distal pulses are not palpable.      ASSESSMENT AND PLAN

## 2014-05-12 NOTE — Assessment & Plan Note (Signed)
Blood pressure is controlled on current medications. 

## 2014-05-12 NOTE — Patient Instructions (Signed)
Your physician has requested that you have a peripheral vascular angiogram. This exam is performed at the hospital. During this exam IV contrast is used to look at arterial blood flow. Please review the information sheet given for details. Scheduled for May 13, 2014

## 2014-05-13 ENCOUNTER — Ambulatory Visit (HOSPITAL_COMMUNITY)
Admission: RE | Admit: 2014-05-13 | Discharge: 2014-05-13 | Disposition: A | Discharge status: Home / Self Care | Payer: PPO | Source: Ambulatory Visit | Attending: Cardiovascular Disease | Admitting: Cardiovascular Disease

## 2014-05-13 ENCOUNTER — Other Ambulatory Visit: Payer: Self-pay | Admitting: Cardiovascular Disease

## 2014-05-13 ENCOUNTER — Encounter (HOSPITAL_COMMUNITY): Payer: Self-pay | Admitting: Cardiovascular Disease

## 2014-05-13 ENCOUNTER — Encounter (HOSPITAL_COMMUNITY)
Admission: RE | Disposition: A | Discharge status: Home / Self Care | Payer: Self-pay | Source: Ambulatory Visit | Attending: Cardiovascular Disease

## 2014-05-13 DIAGNOSIS — Z8249 Family history of ischemic heart disease and other diseases of the circulatory system: Secondary | ICD-10-CM | POA: Insufficient documentation

## 2014-05-13 DIAGNOSIS — I10 Essential (primary) hypertension: Secondary | ICD-10-CM | POA: Insufficient documentation

## 2014-05-13 DIAGNOSIS — I70212 Atherosclerosis of native arteries of extremities with intermittent claudication, left leg: Secondary | ICD-10-CM | POA: Diagnosis not present

## 2014-05-13 DIAGNOSIS — Z794 Long term (current) use of insulin: Secondary | ICD-10-CM | POA: Insufficient documentation

## 2014-05-13 DIAGNOSIS — Z951 Presence of aortocoronary bypass graft: Secondary | ICD-10-CM | POA: Insufficient documentation

## 2014-05-13 DIAGNOSIS — I701 Atherosclerosis of renal artery: Secondary | ICD-10-CM | POA: Diagnosis not present

## 2014-05-13 DIAGNOSIS — K219 Gastro-esophageal reflux disease without esophagitis: Secondary | ICD-10-CM | POA: Diagnosis not present

## 2014-05-13 DIAGNOSIS — Z7952 Long term (current) use of systemic steroids: Secondary | ICD-10-CM | POA: Diagnosis not present

## 2014-05-13 DIAGNOSIS — I2510 Atherosclerotic heart disease of native coronary artery without angina pectoris: Secondary | ICD-10-CM | POA: Insufficient documentation

## 2014-05-13 DIAGNOSIS — Z8507 Personal history of malignant neoplasm of pancreas: Secondary | ICD-10-CM | POA: Diagnosis not present

## 2014-05-13 DIAGNOSIS — Z79899 Other long term (current) drug therapy: Secondary | ICD-10-CM | POA: Insufficient documentation

## 2014-05-13 DIAGNOSIS — Z7982 Long term (current) use of aspirin: Secondary | ICD-10-CM | POA: Insufficient documentation

## 2014-05-13 DIAGNOSIS — E119 Type 2 diabetes mellitus without complications: Secondary | ICD-10-CM | POA: Insufficient documentation

## 2014-05-13 DIAGNOSIS — I739 Peripheral vascular disease, unspecified: Secondary | ICD-10-CM | POA: Diagnosis present

## 2014-05-13 HISTORY — PX: PERCUTANEOUS STENT INTERVENTION: SHX5500

## 2014-05-13 HISTORY — PX: ABDOMINAL AORTAGRAM: SHX5454

## 2014-05-13 LAB — GLUCOSE, CAPILLARY
Glucose-Capillary: 276 mg/dL — ABNORMAL HIGH (ref 70–99)
Glucose-Capillary: 240 mg/dL — ABNORMAL HIGH (ref 70–99)

## 2014-05-13 LAB — POCT ACTIVATED CLOTTING TIME
Activated Clotting Time: 190 seconds
Activated Clotting Time: 214 seconds
Activated Clotting Time: 171 seconds

## 2014-05-13 SURGERY — ABDOMINAL AORTAGRAM

## 2014-05-13 MED ORDER — FENTANYL CITRATE 0.05 MG/ML IJ SOLN
INTRAMUSCULAR | Status: AC
  Filled 2014-05-13: qty 2

## 2014-05-13 MED ORDER — SODIUM CHLORIDE 0.9 % IV SOLN: INTRAVENOUS | Status: DC

## 2014-05-13 MED ORDER — CLOPIDOGREL BISULFATE 75 MG PO TABS: 75.0000 mg | ORAL_TABLET | Freq: Every day | ORAL | Status: DC

## 2014-05-13 MED ORDER — SODIUM CHLORIDE 0.9 % IV SOLN: 250.0000 mL | INTRAVENOUS | Status: DC | PRN

## 2014-05-13 MED ORDER — DIPHENHYDRAMINE HCL 50 MG/ML IJ SOLN
INTRAMUSCULAR | Status: AC
Start: 2014-05-13 — End: 2014-05-13
  Administered 2014-05-13: 25 mg via INTRAVENOUS
  Filled 2014-05-13: qty 1

## 2014-05-13 MED ORDER — DIPHENHYDRAMINE HCL 50 MG/ML IJ SOLN
25.0000 mg | INTRAMUSCULAR | Status: AC
  Administered 2014-05-13: 25 mg via INTRAVENOUS

## 2014-05-13 MED ORDER — MIDAZOLAM HCL 2 MG/2ML IJ SOLN
INTRAMUSCULAR | Status: AC
  Filled 2014-05-13: qty 2

## 2014-05-13 MED ORDER — HEPARIN SODIUM (PORCINE) 1000 UNIT/ML IJ SOLN
INTRAMUSCULAR | Status: AC
  Filled 2014-05-13: qty 1

## 2014-05-13 MED ORDER — SODIUM CHLORIDE 0.9 % IJ SOLN: 3.0000 mL | Freq: Two times a day (BID) | INTRAMUSCULAR | Status: DC

## 2014-05-13 MED ORDER — ASPIRIN 81 MG PO CHEW
81.0000 mg | CHEWABLE_TABLET | ORAL | Status: AC
  Administered 2014-05-13: 81 mg via ORAL

## 2014-05-13 MED ORDER — LIDOCAINE HCL (PF) 1 % IJ SOLN
INTRAMUSCULAR | Status: AC
  Filled 2014-05-13: qty 30

## 2014-05-13 MED ORDER — FAMOTIDINE IN NACL 20-0.9 MG/50ML-% IV SOLN
20.0000 mg | INTRAVENOUS | Status: AC
  Administered 2014-05-13: 20 mg via INTRAVENOUS

## 2014-05-13 MED ORDER — CLOPIDOGREL BISULFATE 300 MG PO TABS
ORAL_TABLET | ORAL | Status: AC
  Filled 2014-05-13: qty 1

## 2014-05-13 MED ORDER — FAMOTIDINE IN NACL 20-0.9 MG/50ML-% IV SOLN
INTRAVENOUS | Status: AC
  Administered 2014-05-13: 20 mg via INTRAVENOUS
  Filled 2014-05-13: qty 50

## 2014-05-13 MED ORDER — ASPIRIN 81 MG PO CHEW
CHEWABLE_TABLET | ORAL | Status: AC
  Filled 2014-05-13: qty 1

## 2014-05-13 MED ORDER — HEPARIN (PORCINE) IN NACL 2-0.9 UNIT/ML-% IJ SOLN
INTRAMUSCULAR | Status: AC
  Filled 2014-05-13: qty 500

## 2014-05-13 MED ORDER — SODIUM CHLORIDE 0.9 % IJ SOLN: 3.0000 mL | INTRAMUSCULAR | Status: DC | PRN

## 2014-05-13 NOTE — H&P (View-Only) (Signed)
Primary care physician: Dr. Lin Landsman Primary cardiologist: Dr. Johnsie Cancel  HPI  [name] is a 74 year old patient who is here today for a follow up visit regarding PAD. He has known history of coronary artery disease status post coronary artery bypass grafting in 2009 with subsequent cardiac catheterization in May 2012 revealing native multivessel disease with 4 of 4 patent grafts.  He reports previous history of pancreatic cancer 5 years ago which was treated successfully. He also reports history of retinal problems. He was told by his ophthalmologist that he should avoid blood thinners due to risk of bleeding. He has history of intolerance to statins due to myalgia. He was seen 2 years ago for mild bilateral calf claudication. His ABI was mildly reduced bilaterally at 0.7 with evidence of a short occlusion and distal SFA bilaterally.  He was not very limited at that time and was treated medically. He did not tolerate cilostazol. Over the last month however, he started having severe left lower back pain radiating to his thigh with minimal walking. It's occasionally worse with certain movements. He was thought to have back problems but he underwent an MRI which was unremarkable. He is very limited by this at the present time. He also reports being hospitalized at [facility] one month ago for shortness of breath with negative workup.  Allergies  Allergen Reactions  . Iohexol      Code: HIVES, Desc: hives - pt ok with premedication, Onset Date: [id number]   . Statins     Myalgias      Current Outpatient Prescriptions on File Prior to Visit  Medication Sig Dispense Refill  . amLODipine (NORVASC) 5 MG tablet Take 5 mg by mouth daily.      [name] Kitchen aspirin 81 MG EC tablet Take 81 mg by mouth daily.     [name] Kitchen glipiZIDE (GLUCOTROL) 10 MG tablet Take 10 mg by mouth daily.      . insulin glargine (LANTUS SOLOSTAR) 100 UNIT/ML injection Inject 12 Units into the skin at bedtime.     [name] Kitchen  lisinopril-hydrochlorothiazide (PRINZIDE,ZESTORETIC) 20-12.5 MG per tablet Take 1 tablet by mouth daily.      . Multiple Vitamin (MULITIVITAMIN WITH MINERALS) TABS Take 1 tablet by mouth daily.    [name] Kitchen omeprazole (PRILOSEC) 20 MG capsule 1 tab daily    . polyethylene glycol powder (GLYCOLAX/MIRALAX) powder Take 8.5 g by mouth daily as needed. For constipation    . predniSONE (DELTASONE) 10 MG tablet as needed.     . tamsulosin (FLOMAX) 0.4 MG CAPS capsule i tab daily    . [DISCONTINUED] dicyclomine (BENTYL) 20 MG tablet Take 1 by mouth two times daily as needed for abd. pain     . [DISCONTINUED] metFORMIN (GLUCOPHAGE) 500 MG tablet Take 500 mg by mouth daily.      . [DISCONTINUED] simvastatin (ZOCOR) 40 MG tablet Take 40 mg by mouth at bedtime.      . [DISCONTINUED] sucralfate (CARAFATE) 1 GM/10ML suspension 10cc by mouth two times a day      No current facility-administered medications on file prior to visit.     Past Medical History  Diagnosis Date  . Palpitations   . CAD (coronary artery disease)     a. 04/2007 s/p CABG x 4  - LIMA->LAD, VG->OM1->Om2, VG->PDA;  b. 12/2008 & 06/2010 Caths - Native multivessel dzs with 4/4 patent grafts.  . Pure hypercholesterolemia     statin intolerance  . DDD (degenerative disc disease)  several surgeries  . Carotid bruit     a. 07/2010 U/S- 0-39% bilat ICA stenosis  . Nephrolithiasis   . HNP (herniated nucleus pulposus), lumbar     a. L2-3, s/p laminotomy, microdiskectomy 02/2009  . Pancreatic mass     a. 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 Chest pain     Noncardiac probably related to reflux  . Unspecified essential hypertension   . Pneumonia     hx of  pna  . Type II or unspecified type diabetes mellitus without mention of complication, not stated as uncontrolled     insulin dependent  . GERD (gastroesophageal reflux disease)      Past Surgical History  Procedure Laterality Date  . Re-exploratin of laminectomy   05/2008    (RT L2-3) WITH REDO MICRODISKECTOMY  . Cyst removed      FROM SPINE  . Nephrolithotomy      PERCUTANEOUS  . Coronary artery bypass graft      X 4 2009 (LIMA to LAD, SVG to first cicumflex marginal branch with sequential SVG to second cicumflex marginal branch  and saphenous vein to posterior descending coronary artery with endoscopic,vein harvest rt. lower exttremity by Dr.Owen on March 31,2009  . Cystourethroscopy      right retrograde pyelogram,manipulate stone in the renal pelvis, rt. double-j catheter.  . Pars plana vitrectomy      lt. eye,retinal photocoagulation lt. eye, membrane peel lt. eye.  . Cholecystectomy    . Appendectomy    . Arthroscopy right knee    . Back surgery    . Knee surgery      rt knee     Family History  Problem Relation Age of Onset  . Heart attack Father     died of MI @ 51  . Diabetes Brother     type 2  . Other      no fh of colon cancer  . Other Mother     died @ 93  . Coronary artery disease Brother     s/p CABG.  Alive @ 79  . Coronary artery disease Sister     alive     History   Social History  . Marital Status: Married    Spouse Name: N/A  . Number of Children: N/A  . Years of Education: N/A   Occupational History  . Not on file.   Social History Main Topics  . Smoking status: Never Smoker   . Smokeless tobacco: Never Used  . Alcohol Use: No  . Drug Use: No  . Sexual Activity: Not Currently   Other Topics Concern  . Not on file   Social History Narrative   Lives in [place] with wife.  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  Does not work 2/2 back problems.   A 10 point review of system was performed. It is negative other than that mentioned in the history of present illness.   PHYSICAL EXAM   BP 130/52 mmHg  Pulse 66  Ht 5\' 7"  (1.702 m)  Wt 161 lb (73.029 kg)  BMI 25.21 kg/m2  SpO2 97% Constitutional: He is oriented to person, place, and time. He appears well-developed and well-nourished. No  distress.  HENT: No nasal discharge.  Head: Normocephalic and atraumatic.  Eyes: Pupils are equal and round. Right eye exhibits no discharge. Left eye exhibits no discharge.  Neck: Normal range of motion. Neck supple. No JVD present. No thyromegaly present. There is left  carotid bruit. Cardiovascular: Normal rate, regular rhythm, normal heart sounds and. Exam reveals no gallop and no friction rub. No murmur heard.  Pulmonary/Chest: Effort normal and breath sounds normal. No stridor. No respiratory distress. He has no wheezes. He has no rales. He exhibits no tenderness.  Abdominal: Soft. Bowel sounds are normal. He exhibits no distension. There is no tenderness. There is no rebound and no guarding.  Musculoskeletal: Normal range of motion. He exhibits no edema and no tenderness.  Neurological: He is alert and oriented to person, place, and time. Coordination normal.  Skin: Skin is warm and dry. No rash noted. He is not diaphoretic. No erythema. No pallor.  Psychiatric: He has a normal mood and affect. His behavior is normal. Judgment and thought content normal.  vascular: Femoral pulses : +2 on the right side and +1 on the left side . Distal pulses are not palpable.      ASSESSMENT AND PLAN

## 2014-05-13 NOTE — CV Procedure (Signed)
PERIPHERAL VASCULAR PROCEDURE  NAME:  Ricardo Le   MRN: [id number] DOB:  1940-09-02   ADMIT DATE: 05/13/2014  Performing Cardiologist: Kathlyn Sacramento Primary Physician: Angelina Sheriff., MD Primary Cardiologist:  Johnsie Cancel  Procedures Performed:  Abdominal Aortic Angiogram with Bi-Iliofemoral Runoff  Bilateral Lower Extremity Angiography (1st Order)  Left common iliac artery angioplasty and stent placement  Mynx closure device    Indication(s):   Claudication    Consent: The procedure with Risks/Benefits/Alternatives and Indications was reviewed with the patient .  All questions were answered.  Medications:  Sedation:  1 mg IV Versed, 50 mcg IV Fentanyl  Contrast:  174  Visipaque   Procedural details: The right groin was prepped, draped, and anesthetized with 1% lidocaine. Using modified Seldinger technique, a 5 French sheath was introduced into the right common femoral artery. A 5 Fr Short Pigtail Catheter was advanced of over a  Versicore wire into the descending Aorta to a level just above the renal arteries. A power injection of 3ml/sec contrast over 1 sec was performed for Abdominal Aortic Angiography.  The catheter was then pulled back to a level just above the Aortic bifurcation, and a second power injection was performed to evaluate the iliac arteries.   Bilateral lower extremity arterial run off was then performed via power injection of 7 ml / sec contrast for a total of 77 ml.    Interventional Procedure:  I obtained arterial access via the left common femoral artery using a micropuncture sheath which was then exchanged into a 7 French bright tip sheath. 5000 units of unfractionated heparin was given with additional 2000 units given given that the ACT was 193. The lesion was predilated with a 7 x 20 mm balloon to 8 atm. I then placed an 8 x 18 mm Genesis balloon expandable stent to 8 atm with excellent results and 0% residual stenosis. The stent went all the  way back to the ostium but did not jail the right common iliac artery. There was no evidence of dissection. The sheath was then exchanged into a short 7 [place] sheath.  Hemostasis was achieved with a  Mynx closure device. The patient tolerated the procedure well with no immediate complications.   Hemodynamics:  Central Aortic Pressure / Mean Aortic Pressure: 160/70  Findings:  Abdominal aorta: Normal in size with no evidence of aneurysm or significant atherosclerosis.  Left renal artery: Minor irregularities proximally.  Right renal artery: 40% proximal stenosis  Celiac artery: Not well visualized but appears to be patent  Superior mesenteric artery: Patent  Right common iliac artery: Mildly calcified with mild diffuse atherosclerosis. There is 20% distal stenosis.  Right internal iliac artery: Proximally occluded.  Right external iliac artery: Minor irregularities.  Right common femoral artery: Calcified 30% disease.  Right profunda femoral artery: Mild diffuse atherosclerosis  Right superficial femoral artery: Calcified 40% proximal disease. There is short occlusion distally with reconstitution via collaterals in the proximal popliteal artery  Right popliteal artery: Calcified 40% disease behind the knee  There is 1 vessel runoff below the knee via the posterior tibial artery which has short occlusion proximally reconstituting with collaterals.   Left common iliac artery:  90% calcified ostial stenosis followed by 20% disease distally.  Left internal iliac artery: Subtotally occluded at the ostium.  Left external iliac artery: Minor irregularities.  Left common femoral artery: Minor irregularities.  Left profunda femoral artery: Diffuse 50% proximal disease.  Left superficial femoral artery:  Diffuse calcified 60% ostial  disease. There is short subtotal occlusion distally with collaterals to the proximal popliteal artery.  Left popliteal artery: Diffuse 40%  disease.  Occluded distal and peroneal arteries below the knee with reconstitution via collaterals to the posterior tibial artery and to a lesser degree the anterior tibial artery.   Conclusions: 1. Severe left common iliac artery stenosis. Borderline significant disease in the ostial left SFA. Subtotal occlusion in the distal SFA. Occluded tibial peroneal vessels below the knee on the left with mainly one-vessel runoff via the posterior tibial artery which reconstituts via good collaterals. 2. Occluded distal right SFA with 1 vessel runoff below the knee. 3. Successful stent placement to the left common iliac artery.  Recommendations:  Dual antiplatelet therapy for one month if tolerated. Further revascularization of the left lower extremity based on response to treatment.   Kathlyn Sacramento, MD, [facility] 05/13/2014 10:00 AM

## 2014-05-13 NOTE — Discharge Instructions (Signed)
Start Plavix 75 mg once daily for 1 month. A prescription was sent to [facility].     Arteriogram Care After These instructions give you information on caring for yourself after your procedure. Your doctor may also give you more specific instructions. Call your doctor if you have any problems or questions after your procedure. HOME CARE  Do not bathe, swim, or use a hot tub until directed by your doctor. You can shower.  Do not lift anything heavier than 10 pounds (about a gallon of milk) for 2 days.  Do not walk a lot, run, or drive for 2 days.  Return to normal activities in 2 days or as told by your doctor. Finding out the results of your test Ask when your test results will be ready. Make sure you get your test results. GET HELP RIGHT AWAY IF:   You have fever.  You have more pain in your leg.  The leg that was cut is:  Bleeding.  Puffy (swollen) or red.  Cold.  Pale or changes color.  Weak.  Tingly or numb. If you go to the Emergency Room, tell your nurse that you have had an arteriogram. Take this paper with you to show the nurse. MAKE SURE YOU:  Understand these instructions.  Will watch your condition.  Will get help right away if you are not doing well or get worse. Document Released: 04/28/2008 Document Revised: 02/04/2013 Document Reviewed: 04/28/2008 [facility] Patient Information 2015 [place], [place]. This information is not intended to replace advice given to you by your health care provider. Make sure you discuss any questions you have with your health care provider.

## 2014-05-13 NOTE — Progress Notes (Signed)
Site area: right groin a 5 french sheath was removed  Site Prior to Removal:  Level 0  Pressure Applied For 20 MINUTES    Minutes Beginning at 1050a  Manual:   Yes.    Patient Status During Pull:  stable  Post Pull Groin Site:  Level 0  Post Pull Instructions Given:  Yes.    Post Pull Pulses Present:  Yes.    Dressing Applied:  Yes.    Comments:  VS stable thru sheath pull. Pt denies any discomfort at this time.

## 2014-05-13 NOTE — Interval H&P Note (Signed)
History and Physical Interval Note:  05/13/2014 8:32 AM  Ricardo Le  has presented today for surgery, with the diagnosis of pad  The various methods of treatment have been discussed with the patient and family. After consideration of risks, benefits and other options for treatment, the patient has consented to  Procedure(s): ABDOMINAL AORTAGRAM (N/A) as a surgical intervention .  The patient's history has been reviewed, patient examined, no change in status, stable for surgery.  I have reviewed the patient's chart and labs.  Questions were answered to the patient's satisfaction.     Kathlyn Sacramento

## 2014-05-14 ENCOUNTER — Telehealth: Payer: Self-pay | Admitting: Cardiovascular Disease

## 2014-05-14 DIAGNOSIS — R110 Nausea: Secondary | ICD-10-CM

## 2014-05-14 NOTE — Telephone Encounter (Signed)
Pt states that he is having left leg pain that is the same pain that he was having prior to stent placement. Informed pt that this is normal and that it may take a few days to adjust. Pt states that he is walking around better today than he was prior to stent. Informed pt that I would send this information over to Dr. [name] for review and someone would contact him back if Dr. [name] had any new orders. Pt verbalized understanding and was in agreement with this plan.

## 2014-05-14 NOTE — Telephone Encounter (Signed)
New message     Patient calling has stent placement on yesterday . C/O pain in leg - left

## 2014-05-15 MED ORDER — ONDANSETRON HCL 4 MG PO TABS: 4.0000 mg | ORAL_TABLET | Freq: Two times a day (BID) | ORAL | Status: DC

## 2014-05-15 NOTE — Telephone Encounter (Signed)
S/w pt's wife agreeable to plan.   Stated pt is constipated is taking Miralax,Prn will start taking every day. Will stop omeprazole for now. Will take Zofran as needed twice a day, sent in today to [facility] in [place] per pt's request.

## 2014-05-15 NOTE — Telephone Encounter (Signed)
S/w pt and wife stated leg is starting to feel better Is experiencing extreme nausea to the point pt can't eat Wife thinks it is coming from omprazole stated only gave pt one omeprazole yesterday instead of two pills Just s/w with Dr.Arida and stated the pt can come off the omeprazole for now  Can start Zofran ( 4 mg ) bid prn I will call pt back

## 2014-05-15 NOTE — Telephone Encounter (Signed)
He should give it few days then start walking and see how it feels. Keep follow up and ABI appointments.

## 2014-05-18 ENCOUNTER — Emergency Department (HOSPITAL_COMMUNITY): Payer: PPO

## 2014-05-18 ENCOUNTER — Observation Stay (HOSPITAL_COMMUNITY): Payer: PPO

## 2014-05-18 ENCOUNTER — Encounter (HOSPITAL_COMMUNITY): Payer: Self-pay

## 2014-05-18 ENCOUNTER — Observation Stay (HOSPITAL_COMMUNITY)
Admission: EM | Admit: 2014-05-18 | Discharge: 2014-05-20 | Disposition: A | Discharge status: Home / Self Care | Payer: PPO | Attending: Internal Medicine | Admitting: Internal Medicine

## 2014-05-18 ENCOUNTER — Telehealth: Payer: Self-pay | Admitting: Cardiovascular Disease

## 2014-05-18 DIAGNOSIS — K219 Gastro-esophageal reflux disease without esophagitis: Secondary | ICD-10-CM | POA: Diagnosis not present

## 2014-05-18 DIAGNOSIS — D72829 Elevated white blood cell count, unspecified: Secondary | ICD-10-CM | POA: Diagnosis not present

## 2014-05-18 DIAGNOSIS — E119 Type 2 diabetes mellitus without complications: Secondary | ICD-10-CM | POA: Diagnosis not present

## 2014-05-18 DIAGNOSIS — I10 Essential (primary) hypertension: Secondary | ICD-10-CM | POA: Insufficient documentation

## 2014-05-18 DIAGNOSIS — Z951 Presence of aortocoronary bypass graft: Secondary | ICD-10-CM | POA: Insufficient documentation

## 2014-05-18 DIAGNOSIS — R079 Chest pain, unspecified: Secondary | ICD-10-CM | POA: Insufficient documentation

## 2014-05-18 DIAGNOSIS — E1159 Type 2 diabetes mellitus with other circulatory complications: Secondary | ICD-10-CM | POA: Diagnosis present

## 2014-05-18 DIAGNOSIS — Z8507 Personal history of malignant neoplasm of pancreas: Secondary | ICD-10-CM | POA: Insufficient documentation

## 2014-05-18 DIAGNOSIS — G8929 Other chronic pain: Secondary | ICD-10-CM | POA: Diagnosis not present

## 2014-05-18 DIAGNOSIS — R109 Unspecified abdominal pain: Secondary | ICD-10-CM

## 2014-05-18 DIAGNOSIS — Z7901 Long term (current) use of anticoagulants: Secondary | ICD-10-CM | POA: Insufficient documentation

## 2014-05-18 DIAGNOSIS — I739 Peripheral vascular disease, unspecified: Secondary | ICD-10-CM | POA: Insufficient documentation

## 2014-05-18 DIAGNOSIS — Z8249 Family history of ischemic heart disease and other diseases of the circulatory system: Secondary | ICD-10-CM | POA: Diagnosis not present

## 2014-05-18 DIAGNOSIS — M545 Low back pain: Secondary | ICD-10-CM

## 2014-05-18 DIAGNOSIS — M5136 Other intervertebral disc degeneration, lumbar region: Secondary | ICD-10-CM | POA: Diagnosis not present

## 2014-05-18 DIAGNOSIS — Z955 Presence of coronary angioplasty implant and graft: Secondary | ICD-10-CM | POA: Insufficient documentation

## 2014-05-18 DIAGNOSIS — E780 Pure hypercholesterolemia: Secondary | ICD-10-CM | POA: Insufficient documentation

## 2014-05-18 DIAGNOSIS — R52 Pain, unspecified: Secondary | ICD-10-CM | POA: Diagnosis present

## 2014-05-18 DIAGNOSIS — K5900 Constipation, unspecified: Secondary | ICD-10-CM | POA: Diagnosis not present

## 2014-05-18 DIAGNOSIS — I2510 Atherosclerotic heart disease of native coronary artery without angina pectoris: Secondary | ICD-10-CM | POA: Diagnosis not present

## 2014-05-18 DIAGNOSIS — Z794 Long term (current) use of insulin: Secondary | ICD-10-CM | POA: Diagnosis not present

## 2014-05-18 DIAGNOSIS — M542 Cervicalgia: Secondary | ICD-10-CM

## 2014-05-18 LAB — CBC WITH DIFFERENTIAL/PLATELET
Basophils Absolute: 0.1 10*3/uL (ref 0.0–0.1)
Basophils Relative: 1 % (ref 0–1)
Eosinophils Absolute: 0 10*3/uL (ref 0.0–0.7)
Eosinophils Relative: 0 % (ref 0–5)
HCT: 43.5 % (ref 39.0–52.0)
Hemoglobin: 15.3 g/dL (ref 13.0–17.0)
Lymphocytes Relative: 11 % — ABNORMAL LOW (ref 12–46)
Lymphs Abs: 1.6 10*3/uL (ref 0.7–4.0)
MCH: 32.1 pg (ref 26.0–34.0)
MCHC: 35.2 g/dL (ref 30.0–36.0)
MCV: 91.4 fL (ref 78.0–100.0)
Monocytes Absolute: 0.5 10*3/uL (ref 0.1–1.0)
Monocytes Relative: 3 % (ref 3–12)
Neutro Abs: 11.8 10*3/uL — ABNORMAL HIGH (ref 1.7–7.7)
Neutrophils Relative %: 85 % — ABNORMAL HIGH (ref 43–77)
Platelets: 278 10*3/uL (ref 150–400)
RBC: 4.76 MIL/uL (ref 4.22–5.81)
RDW: 12.9 % (ref 11.5–15.5)
WBC: 13.9 10*3/uL — ABNORMAL HIGH (ref 4.0–10.5)

## 2014-05-18 LAB — URINALYSIS, ROUTINE W REFLEX MICROSCOPIC
Bilirubin Urine: NEGATIVE
Glucose, UA: 500 mg/dL — AB
Hgb urine dipstick: NEGATIVE
Ketones, ur: NEGATIVE mg/dL
Leukocytes, UA: NEGATIVE
Nitrite: NEGATIVE
Protein, ur: NEGATIVE mg/dL
Specific Gravity, Urine: 1.022 (ref 1.005–1.030)
Urobilinogen, UA: 0.2 mg/dL (ref 0.0–1.0)
pH: 7.5 (ref 5.0–8.0)

## 2014-05-18 LAB — COMPREHENSIVE METABOLIC PANEL
ALT: 28 U/L (ref 0–53)
AST: 31 U/L (ref 0–37)
Albumin: 3.8 g/dL (ref 3.5–5.2)
Alkaline Phosphatase: 55 U/L (ref 39–117)
Anion gap: 12 (ref 5–15)
BUN: 22 mg/dL (ref 6–23)
CO2: 27 mmol/L (ref 19–32)
Calcium: 9.6 mg/dL (ref 8.4–10.5)
Chloride: 98 mmol/L (ref 96–112)
Creatinine, Ser: 1.08 mg/dL (ref 0.50–1.35)
GFR calc Af Amer: 77 mL/min — ABNORMAL LOW (ref >90)
GFR calc non Af Amer: 67 mL/min — ABNORMAL LOW (ref >90)
Glucose, Bld: 180 mg/dL — ABNORMAL HIGH (ref 70–99)
Potassium: 4.3 mmol/L (ref 3.5–5.1)
Sodium: 137 mmol/L (ref 135–145)
Total Bilirubin: 0.9 mg/dL (ref 0.3–1.2)
Total Protein: 7.1 g/dL (ref 6.0–8.3)

## 2014-05-18 LAB — GLUCOSE, CAPILLARY: Glucose-Capillary: 310 mg/dL — ABNORMAL HIGH (ref 70–99)

## 2014-05-18 LAB — D-DIMER, QUANTITATIVE (NOT AT ARMC): D-Dimer, Quant: 0.66 ug/mL-FEU — ABNORMAL HIGH (ref 0.00–0.48)

## 2014-05-18 LAB — I-STAT TROPONIN, ED: Troponin i, poc: 0 ng/mL (ref 0.00–0.08)

## 2014-05-18 LAB — PROTIME-INR
INR: 1.06 (ref 0.00–1.49)
Prothrombin Time: 13.9 seconds (ref 11.6–15.2)

## 2014-05-18 LAB — LIPASE, BLOOD: Lipase: 115 U/L — ABNORMAL HIGH (ref 11–59)

## 2014-05-18 LAB — TROPONIN I: Troponin I: <0.03 ng/mL (ref <0.031)

## 2014-05-18 LAB — BRAIN NATRIURETIC PEPTIDE: B Natriuretic Peptide: 31.4 pg/mL (ref 0.0–100.0)

## 2014-05-18 LAB — LACTIC ACID, PLASMA: Lactic Acid, Venous: 2 mmol/L (ref 0.5–2.0)

## 2014-05-18 MED ORDER — MORPHINE SULFATE 2 MG/ML IJ SOLN
2.0000 mg | Freq: Once | INTRAMUSCULAR | Status: AC
  Administered 2014-05-18: 2 mg via INTRAVENOUS
  Filled 2014-05-18: qty 1

## 2014-05-18 MED ORDER — SENNA 8.6 MG PO TABS
2.0000 | ORAL_TABLET | Freq: Every day | ORAL | Status: DC
  Administered 2014-05-18 – 2014-05-20 (×3): 17.2 mg via ORAL
  Filled 2014-05-18 (×3): qty 2

## 2014-05-18 MED ORDER — PANTOPRAZOLE SODIUM 40 MG PO TBEC
40.0000 mg | DELAYED_RELEASE_TABLET | Freq: Every day | ORAL | Status: DC
  Filled 2014-05-18: qty 1

## 2014-05-18 MED ORDER — IOHEXOL 300 MG/ML  SOLN
80.0000 mL | Freq: Once | INTRAMUSCULAR | Status: AC | PRN
Start: 2014-05-18 — End: 2014-05-18
  Administered 2014-05-18: 80 mL via INTRAVENOUS

## 2014-05-18 MED ORDER — HYDROCHLOROTHIAZIDE 12.5 MG PO CAPS
12.5000 mg | ORAL_CAPSULE | Freq: Every day | ORAL | Status: DC
  Administered 2014-05-19 – 2014-05-20 (×2): 12.5 mg via ORAL
  Filled 2014-05-18 (×3): qty 1

## 2014-05-18 MED ORDER — POLYETHYLENE GLYCOL 3350 17 G PO PACK
17.0000 g | PACK | Freq: Three times a day (TID) | ORAL | Status: DC
  Administered 2014-05-18 – 2014-05-19 (×2): 17 g via ORAL
  Filled 2014-05-18 (×3): qty 1

## 2014-05-18 MED ORDER — INSULIN GLARGINE 100 UNIT/ML ~~LOC~~ SOLN
12.0000 [IU] | Freq: Every day | SUBCUTANEOUS | Status: DC
  Filled 2014-05-18: qty 0.15

## 2014-05-18 MED ORDER — SODIUM CHLORIDE 0.9 % IJ SOLN
3.0000 mL | Freq: Two times a day (BID) | INTRAMUSCULAR | Status: DC
  Administered 2014-05-19 – 2014-05-20 (×2): 3 mL via INTRAVENOUS

## 2014-05-18 MED ORDER — DIPHENHYDRAMINE HCL 50 MG/ML IJ SOLN
50.0000 mg | Freq: Once | INTRAMUSCULAR | Status: AC
  Administered 2014-05-18: 50 mg via INTRAVENOUS
  Filled 2014-05-18: qty 1

## 2014-05-18 MED ORDER — MORPHINE SULFATE 4 MG/ML IJ SOLN
4.0000 mg | Freq: Once | INTRAMUSCULAR | Status: AC
Start: 2014-05-18 — End: 2014-05-18
  Administered 2014-05-18: 4 mg via INTRAVENOUS
  Filled 2014-05-18: qty 1

## 2014-05-18 MED ORDER — LISINOPRIL-HYDROCHLOROTHIAZIDE 20-12.5 MG PO TABS: 1.0000 | ORAL_TABLET | Freq: Every day | ORAL | Status: DC

## 2014-05-18 MED ORDER — INSULIN ASPART 100 UNIT/ML ~~LOC~~ SOLN
0.0000 [IU] | SUBCUTANEOUS | Status: DC
  Administered 2014-05-18: 7 [IU] via SUBCUTANEOUS
  Administered 2014-05-19: 9 [IU] via SUBCUTANEOUS
  Administered 2014-05-19: 3 [IU] via SUBCUTANEOUS
  Administered 2014-05-19: 5 [IU] via SUBCUTANEOUS

## 2014-05-18 MED ORDER — METHYLPREDNISOLONE SODIUM SUCC 125 MG IJ SOLR
125.0000 mg | Freq: Once | INTRAMUSCULAR | Status: AC
  Administered 2014-05-18: 125 mg via INTRAVENOUS
  Filled 2014-05-18: qty 2

## 2014-05-18 MED ORDER — HEPARIN SODIUM (PORCINE) 5000 UNIT/ML IJ SOLN
5000.0000 [IU] | Freq: Three times a day (TID) | INTRAMUSCULAR | Status: DC
Start: 2014-05-18 — End: 2014-05-20
  Filled 2014-05-18 (×2): qty 1

## 2014-05-18 MED ORDER — HYDROCODONE-ACETAMINOPHEN 5-325 MG PO TABS
1.0000 | ORAL_TABLET | Freq: Four times a day (QID) | ORAL | Status: DC | PRN
  Administered 2014-05-18 – 2014-05-20 (×6): 1 via ORAL
  Filled 2014-05-18 (×6): qty 1

## 2014-05-18 MED ORDER — SODIUM CHLORIDE 0.9 % IV SOLN
INTRAVENOUS | Status: DC
  Administered 2014-05-18 – 2014-05-19 (×2): via INTRAVENOUS

## 2014-05-18 MED ORDER — INSULIN GLARGINE 100 UNIT/ML ~~LOC~~ SOLN
12.0000 [IU] | Freq: Every day | SUBCUTANEOUS | Status: DC
  Administered 2014-05-18 – 2014-05-19 (×2): 12 [IU] via SUBCUTANEOUS
  Filled 2014-05-18 (×3): qty 0.12

## 2014-05-18 MED ORDER — CLOPIDOGREL BISULFATE 75 MG PO TABS
75.0000 mg | ORAL_TABLET | Freq: Every day | ORAL | Status: DC
  Administered 2014-05-19 – 2014-05-20 (×2): 75 mg via ORAL
  Filled 2014-05-18 (×3): qty 1

## 2014-05-18 MED ORDER — AMLODIPINE BESYLATE 5 MG PO TABS
5.0000 mg | ORAL_TABLET | Freq: Every day | ORAL | Status: DC
  Administered 2014-05-18 – 2014-05-20 (×3): 5 mg via ORAL
  Filled 2014-05-18 (×3): qty 1

## 2014-05-18 MED ORDER — LISINOPRIL 20 MG PO TABS
20.0000 mg | ORAL_TABLET | Freq: Every day | ORAL | Status: DC
  Administered 2014-05-19 – 2014-05-20 (×2): 20 mg via ORAL
  Filled 2014-05-18 (×3): qty 1

## 2014-05-18 NOTE — Telephone Encounter (Signed)
New message     Pt c/o Shortness Of Breath: STAT if SOB developed within the last 24 hours or pt is noticeably SOB on the phone  1. Are you currently SOB (can you hear that pt is SOB on the phone)? yes  2. How long have you been experiencing SOB? 2-3 days but got worse over the weekend 3. Are you SOB when sitting or when up moving around? All of the time  4. Are you currently experiencing any other symptoms? Pt got stents in legs 1 wk ago.  Achy pain on left side from neck to toe

## 2014-05-18 NOTE — ED Notes (Signed)
Patient transported to X-ray 

## 2014-05-18 NOTE — ED Notes (Addendum)
Pt states he does not want the "scans" ordered, stating insurance will not pay.  MD updated and aware.  Pt would like to speak with MD before scans done.  Will hold premedication for CT contrast until decision is made, MD aware.

## 2014-05-18 NOTE — ED Notes (Signed)
Patient transported to CT 

## 2014-05-18 NOTE — ED Notes (Signed)
MD at bedside. 

## 2014-05-18 NOTE — ED Notes (Signed)
Attempted report 

## 2014-05-18 NOTE — H&P (Addendum)
Hospitalist Admission History and Physical  Patient name: Ricardo Le record number: [id number] Date of birth: 09-12-1940 Age: 74 y.o. Gender: male  Primary Care Provider: Angelina Sheriff., MD  Chief Complaint: Pain  History of Present Illness:This is a 74 y.o. year old male with significant past medical history of CAD s/p stenting, pancreatic cancer s/p partial pancreatic resection at [facility], PVD s/p L common iliac angioplasty and stent placement 3/30, IDDM, chronic back pain presenting with pain. Patient reports he does several weeks of persistent lower extremity pain left greater than right. Patient states that he had a iliac stent placed on the 30th. Since that point lower leg pain has improved though pain is seen to migrate primary on the to the left hip as well as back. Patient states he was told by procedural physician Dr. Merideth Abbey that this would likely happen status post procedure. Has been compliant with medication regimen. Has also had some intermittent neck pain. Denies any radicular symptoms. Has had intermittent episodes of shortness of breath. Patient states he's had episodes of shortness of breath that has had a full workup within the past 3-4 years. Denies any active shortness of breath currently. Pain intermittently 9 out of 10 at times. Has been taking tramadol at home. This is been the only alleviating factor. Denies any distal numbness or paresthesias. Reports stable blood sugars at home. Patient reports that prior to the iliac stent placement, he was unable to walk with significant pain. Patient is not able to walk with pain improved the pain seemed to have migrated. Patient calls cardiology earlier today about multiple symptoms including nausea and shortness of breath. These have since resolved. Does complain of constipation since use of tramadol. Presented to the ER temperature 97.9, heart rate in the 60s to 90s, respirations intense 30s, blood pressure  in the 110s to 160s, satting 95% on room air. White blood cell count 13.9, hemoglobin 15.3, creatinine 1.08. D-dimer 0.66. Troponin negative 1. EKG normal sinus rhythm with incomplete right bundle branch block. Follow-up imaging including CT of the chest and abdomen and pelvis with contrast negative for any acute abnormalities. Noted significant stool burden. Minimal changes and left inguinal region status post stent placement.  Assessment and Plan: Ricardo Le is a 74 y.o. year old male presenting with Pain   Active Problems:   Pain   1- Pain  -Patient presents today with multiple areas of pain including left hip, back, abdomen, neck. -Duration of sxs  subacute and chronic -Suspect symptoms are multifactorial in the setting of baseline peripheral vascular disease, prior disc herniation, constipation -cards consult given recent iliac stenting-neurovascularly intact distally (have attempted to page cards multiple times w/ no call back-reconsult in am) -start bowel regimen -check lactate  -noted hx/o panreatic ca s/p partial resection -pt self reports normal f/u bone scan at [facility] in 2011 -may benefit from formal bone scan (pt does not desire to follow up w/ [facility])-onc c/s as clinically indicated vs. outpt f/u -neck xray- full ROM on exam-anticipate degenerative findings  -vicodin for pain   2- CAD  -no active CP currently, though w/ ? Intermittent SOB -no active dyspnea -no resp distress/ hypoxia -trop neg x1 -EKG stable -cycle CEs -cont home regimen -tele bed -f/u cards recs   4-PVD -s/p iliac stent 3/30 -procedural site stable -neurovascularly intact distally -follow  5-IDDM -cont lantus -hold orals -SSI  -A!C  6-Leukocytosis -unclear etiology -likely post procedural  -UA and CXR/chest CT non indicative  of infection -trend  -pan culture if pt spikes fever   FEN/GI: heart healthy-carb modified die t Prophylaxis: sub q heparin  Disposition: pending  further evaluation  Code Status:Full Code    Patient Active Problem List   Diagnosis Date Noted  . Pain 05/18/2014  . PAD (peripheral artery disease) 03/20/2012  . Unstable angina 05/05/2011  . Coronary atherosclerosis   . DDD (degenerative disc disease)   . HNP (herniated nucleus pulposus), lumbar   . Pancreatic mass   . Nephrolithiasis   . Carotid bruit   . Bruit 07/06/2010  . NONSPECIFIC ABN FINDING RAD & OTH EXAM GI TRACT 06/09/2009  . NAUSEA ALONE 05/28/2009  . CHANGE IN BOWELS 05/28/2009  . Elevated lipids 05/27/2009  . ABDOMINAL PAIN, GENERALIZED 05/27/2009  . CONSTIPATION 05/10/2009  . Type 2 diabetes mellitus with vascular disease 12/16/2008  . Essential hypertension 12/16/2008  . BACK PAIN 12/16/2008  . Palpitations 12/16/2008   Past Medical History: Past Medical History  Diagnosis Date  . Palpitations   . CAD (coronary artery disease)     a. 04/2007 s/p CABG x 4  - LIMA->LAD, VG->OM1->Om2, VG->PDA;  b. 12/2008 & 06/2010 Caths - Native multivessel dzs with 4/4 patent grafts.  . Pure hypercholesterolemia     statin intolerance  . DDD (degenerative disc disease)     several surgeries  . Carotid bruit     a. 07/2010 U/S- 0-39% bilat ICA stenosis  . Nephrolithiasis   . HNP (herniated nucleus pulposus), lumbar     a. L2-3, s/p laminotomy, microdiskectomy 02/2009  . Pancreatic mass     a. 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 Chest pain     Noncardiac probably related to reflux  . Unspecified essential hypertension   . Pneumonia     hx of  pna  . Type II or unspecified type diabetes mellitus without mention of complication, not stated as uncontrolled     insulin dependent  . GERD (gastroesophageal reflux disease)     Past Surgical History: Past Surgical History  Procedure Laterality Date  . Re-exploratin of laminectomy  05/2008    (RT L2-3) WITH REDO MICRODISKECTOMY  . Cyst removed      FROM SPINE  . Nephrolithotomy       PERCUTANEOUS  . Coronary artery bypass graft      X 4 2009 (LIMA to LAD, SVG to first cicumflex marginal branch with sequential SVG to second cicumflex marginal branch  and saphenous vein to posterior descending coronary artery with endoscopic,vein harvest rt. lower exttremity by Dr.Owen on March 31,2009  . Cystourethroscopy      right retrograde pyelogram,manipulate stone in the renal pelvis, rt. double-j catheter.  . Pars plana vitrectomy      lt. eye,retinal photocoagulation lt. eye, membrane peel lt. eye.  . Cholecystectomy    . Appendectomy    . Arthroscopy right knee    . Back surgery    . Knee surgery      rt knee  . Abdominal aortagram N/A 05/13/2014    Procedure: ABDOMINAL [name];  Surgeon: Wellington Hampshire, MD;  Location: [facility] CATH LAB;  Service: Cardiovascular;  Laterality: N/A;  . Percutaneous stent intervention Left 05/13/2014    Procedure: PERCUTANEOUS STENT INTERVENTION;  Surgeon: Wellington Hampshire, MD;  Location: [facility] CATH LAB;  Service: Cardiovascular;  Laterality: Left;  COMMON ILLIAC    Social History: History   Social History  . Marital Status: Married    Spouse  Name: N/A  . Number of Children: N/A  . Years of Education: N/A   Social History Main Topics  . Smoking status: Never Smoker   . Smokeless tobacco: Never Used  . Alcohol Use: No  . Drug Use: No  . Sexual Activity: Not Currently   Other Topics Concern  . None   Social History Narrative   Lives in [place] with wife.  Retired Company secretary.  Walks 1.70mi daily.  Does not work 2/2 back problems.    Family History: Family History  Problem Relation Age of Onset  . Heart attack Father     died of MI @ 44  . Diabetes Brother     type 2  . Other      no fh of colon cancer  . Other Mother     died @ 64  . Coronary artery disease Brother     s/p CABG.  Alive @ 15  . Coronary artery disease Sister     alive    Allergies: Allergies  Allergen Reactions  . Iohexol      Code: HIVES, Desc: hives - pt  ok with premedication, Onset Date: [id number]   . Statins     Myalgias     Current Facility-Administered Medications  Medication Dose Route Frequency Provider Last Rate Last Dose  . 0.9 %  sodium chloride infusion   Intravenous Continuous Deneise Lever, MD      . amLODipine (NORVASC) tablet 5 mg  5 mg Oral Daily Deneise Lever, MD      . clopidogrel (PLAVIX) tablet 75 mg  75 mg Oral Daily Deneise Lever, MD      . heparin injection 5,000 Units  5,000 Units Subcutaneous 3 times per day Deneise Lever, MD      . HYDROcodone-acetaminophen (NORCO/VICODIN) 5-325 MG per tablet 1 tablet  1 tablet Oral Q6H PRN Deneise Lever, MD      . insulin glargine (LANTUS) injection 12-15 Units  12-15 Units Subcutaneous QHS Deneise Lever, MD      . lisinopril-hydrochlorothiazide (PRINZIDE,ZESTORETIC) 20-12.5 MG per tablet 1 tablet  1 tablet Oral Daily Deneise Lever, MD      . pantoprazole (PROTONIX) EC tablet 40 mg  40 mg Oral Daily Deneise Lever, MD      . polyethylene glycol (MIRALAX / GLYCOLAX) packet 17 g  17 g Oral TID Deneise Lever, MD      . senna [facility]) tablet 17.2 mg  2 tablet Oral Daily Deneise Lever, MD      . sodium chloride 0.9 % injection 3 mL  3 mL Intravenous Q12H Deneise Lever, MD       Current Outpatient Prescriptions  Medication Sig Dispense Refill  . acetaminophen (TYLENOL) 325 MG tablet Take 650 mg by mouth every 6 (six) hours as needed (pain).    [name] Kitchen amLODipine (NORVASC) 5 MG tablet Take 5 mg by mouth daily.      . clopidogrel (PLAVIX) 75 MG tablet Take 1 tablet (75 mg total) by mouth daily. 30 tablet 0  . fenofibrate (TRICOR) 145 MG tablet Take 145 mg by mouth daily.    [name] Kitchen glipiZIDE (GLUCOTROL) 10 MG tablet Take 10 mg by mouth daily.      . insulin glargine (LANTUS SOLOSTAR) 100 UNIT/ML injection Inject 12-15 Units into the skin at bedtime.     [name] Kitchen lisinopril-hydrochlorothiazide (PRINZIDE,ZESTORETIC) 20-12.5 MG per tablet Take 1 tablet by mouth daily.      [name] Kitchen  magnesium  citrate SOLN Take 1 Bottle by mouth once.    . polyethylene glycol powder (GLYCOLAX/MIRALAX) powder Take 8.5 g by mouth daily.    . traMADol (ULTRAM) 50 MG tablet Take 50 mg by mouth every 6 (six) hours as needed (pain).    [name] Kitchen aspirin 81 MG EC tablet Take 81 mg by mouth daily.     . FENOFIBRATE PO Take 1 tablet by mouth daily.    . Multiple Vitamin (MULITIVITAMIN WITH MINERALS) TABS Take 1 tablet by mouth daily.    [name] Kitchen omeprazole (PRILOSEC) 20 MG capsule Take 20 mg by mouth 2 (two) times daily before a meal. 1 tab daily    . ondansetron (ZOFRAN) 4 MG tablet Take 1 tablet (4 mg total) by mouth 2 (two) times daily. As needed (Patient not taking: Reported on 05/18/2014) 30 tablet 0  . tamsulosin (FLOMAX) 0.4 MG CAPS capsule Take 0.4 mg by mouth daily as needed (retention). i tab daily    . [DISCONTINUED] dicyclomine (BENTYL) 20 MG tablet Take 1 by mouth two times daily as needed for abd. pain     . [DISCONTINUED] metFORMIN (GLUCOPHAGE) 500 MG tablet Take 500 mg by mouth daily.      . [DISCONTINUED] simvastatin (ZOCOR) 40 MG tablet Take 40 mg by mouth at bedtime.      . [DISCONTINUED] sucralfate (CARAFATE) 1 GM/10ML suspension 10cc by mouth two times a day      Review Of Systems: 12 point ROS negative except as noted above in HPI.  Physical Exam: Filed Vitals:   05/18/14 1900  BP: 149/59  Pulse: 92  Temp:   Resp: 19    General: alert and cooperative HEENT: PERRLA and extra ocular movement intact Heart: S1, S2 normal, no murmur, rub or gallop, regular rate and rhythm Lungs: clear to auscultation, no wheezes or rales and unlabored breathing Abdomen: abdomen is soft without significant tenderness, masses, organomegaly or guarding Extremities: post catheterization changes in groin, neurovascularly intact distally  Skin:no rashes Neurology: normal without focal findings  Labs and Imaging: Lab Results  Component Value Date/Time   NA 137 05/18/2014 04:55 PM   K 4.3 05/18/2014 04:55 PM   CL 98  05/18/2014 04:55 PM   CO2 27 05/18/2014 04:55 PM   BUN 22 05/18/2014 04:55 PM   CREATININE 1.08 05/18/2014 04:55 PM   GLUCOSE 180* 05/18/2014 04:55 PM   Lab Results  Component Value Date   WBC 13.9* 05/18/2014   HGB 15.3 05/18/2014   HCT 43.5 05/18/2014   MCV 91.4 05/18/2014   PLT 278 05/18/2014    Ct Chest W Contrast  05/18/2014   CLINICAL DATA:  Lower abdominal pain. Status post stent placement last week. Pain of the body through the chest. Symptoms started 2 weeks ago. Patient received 1 hour emergency quick prep. History of hives 20 years ago.  EXAM: CT CHEST, ABDOMEN, AND PELVIS WITH CONTRAST  TECHNIQUE: Multidetector CT imaging of the chest, abdomen and pelvis was performed following the standard protocol during bolus administration of intravenous contrast.  CONTRAST:  77mL OMNIPAQUE IOHEXOL 300 MG/ML  SOLN  COMPARISON:  Chest x-ray 05/18/2014 in CT of the abdomen and pelvis on 03/10/2014  FINDINGS: CT CHEST FINDINGS  Heart: Heart size is normal. Numerous coronary stents and extensive coronary artery calcification present. Mitral annulus calcification is noted. There is atherosclerotic calcification.  Vascular structures: Variant Aortic anatomy. The left common carotid and right brachiocephalic arteries have a common trunk. No evidence for dissection or aneurysm. The  thoracic aorta is densely calcified. Previous median sternotomy.  Mediastinum/thyroid: The visualized portion of the thyroid gland has a normal appearance. No mediastinal, hilar, or axillary adenopathy.  Lungs/Airways: No pleural effusions. There is minimal subsegmental right lower lobe atelectasis. No pneumothorax.  Chest wall/osseous structures: Moderate degenerative changes in the thoracic spine.  CT ABDOMEN AND PELVIS FINDINGS  Upper abdomen: Status post cholecystectomy. There are small calcified granulomata within the liver. Status post splenectomy. Status post distal pancreatectomy. Gallbladder surgically absent. In the lower  pole the right kidney there is a 6 mm calculus. No hydronephrosis or ureteral stones are identified. Left kidney is unremarkable in appearance.  Gastrointestinal tract: The stomach and small bowel loops are unremarkable in appearance. Status post appendectomy. Colonic loops are distended stool. Bowel wall is normal appeared radiopaque tablets within the cecum.  Pelvis: The prostate gland is enlarged. Urinary bladder is otherwise normal in appearance. There is no free pelvic fluid.  Retroperitoneum: There is dense atherosclerotic calcification of the abdominal aorta. No aneurysm. There is mild stranding within the left inguinal region consistent with recent arterial access. No significant extraperitoneal hemorrhage or evidence for pseudo aneurysm.  Abdominal wall: Unremarkable.  Osseous structures: Degenerative changes are present in the lumbar spine. No suspicious lytic or blastic lesions are identified.  IMPRESSION: 1. Status post median sternotomy. 2. Coronary artery stents and extensive coronary artery disease. 3. No pneumothorax. 4. Minimal changes in the left inguinal region following presumed left inguinal arterial access. No significant hemorrhage in this region. 5. Significant stool burden. Bowel loops otherwise have a normal appearance. 6. No evidence for free intraperitoneal air or pneumothorax.   Electronically Signed   By: Nolon Nations M.D.   On: 05/18/2014 17:11   Ct Abdomen Pelvis W Contrast  05/18/2014   CLINICAL DATA:  Lower abdominal pain. Status post stent placement last week. Pain of the body through the chest. Symptoms started 2 weeks ago. Patient received 1 hour emergency quick prep. History of hives 20 years ago.  EXAM: CT CHEST, ABDOMEN, AND PELVIS WITH CONTRAST  TECHNIQUE: Multidetector CT imaging of the chest, abdomen and pelvis was performed following the standard protocol during bolus administration of intravenous contrast.  CONTRAST:  4mL OMNIPAQUE IOHEXOL 300 MG/ML  SOLN   COMPARISON:  Chest x-ray 05/18/2014 in CT of the abdomen and pelvis on 03/10/2014  FINDINGS: CT CHEST FINDINGS  Heart: Heart size is normal. Numerous coronary stents and extensive coronary artery calcification present. Mitral annulus calcification is noted. There is atherosclerotic calcification.  Vascular structures: Variant Aortic anatomy. The left common carotid and right brachiocephalic arteries have a common trunk. No evidence for dissection or aneurysm. The thoracic aorta is densely calcified. Previous median sternotomy.  Mediastinum/thyroid: The visualized portion of the thyroid gland has a normal appearance. No mediastinal, hilar, or axillary adenopathy.  Lungs/Airways: No pleural effusions. There is minimal subsegmental right lower lobe atelectasis. No pneumothorax.  Chest wall/osseous structures: Moderate degenerative changes in the thoracic spine.  CT ABDOMEN AND PELVIS FINDINGS  Upper abdomen: Status post cholecystectomy. There are small calcified granulomata within the liver. Status post splenectomy. Status post distal pancreatectomy. Gallbladder surgically absent. In the lower pole the right kidney there is a 6 mm calculus. No hydronephrosis or ureteral stones are identified. Left kidney is unremarkable in appearance.  Gastrointestinal tract: The stomach and small bowel loops are unremarkable in appearance. Status post appendectomy. Colonic loops are distended stool. Bowel wall is normal appeared radiopaque tablets within the cecum.  Pelvis: The prostate gland  is enlarged. Urinary bladder is otherwise normal in appearance. There is no free pelvic fluid.  Retroperitoneum: There is dense atherosclerotic calcification of the abdominal aorta. No aneurysm. There is mild stranding within the left inguinal region consistent with recent arterial access. No significant extraperitoneal hemorrhage or evidence for pseudo aneurysm.  Abdominal wall: Unremarkable.  Osseous structures: Degenerative changes are present  in the lumbar spine. No suspicious lytic or blastic lesions are identified.  IMPRESSION: 1. Status post median sternotomy. 2. Coronary artery stents and extensive coronary artery disease. 3. No pneumothorax. 4. Minimal changes in the left inguinal region following presumed left inguinal arterial access. No significant hemorrhage in this region. 5. Significant stool burden. Bowel loops otherwise have a normal appearance. 6. No evidence for free intraperitoneal air or pneumothorax.   Electronically Signed   By: Nolon Nations M.D.   On: 05/18/2014 17:11   Dg Chest Port 1 View  05/18/2014   CLINICAL DATA:  Pain from the left shoulder that travels down to the left hip. Abdominal pain.  EXAM: PORTABLE CHEST - 1 VIEW  COMPARISON:  05/05/2011  FINDINGS: Chronic elevation of the right hemidiaphragm. Overall, low lung volumes. There are median sternotomy wires. No focal airspace disease or pulmonary edema. Heart size is normal. No acute bone abnormality. Again noted is gas underneath the right hemidiaphragm that is likely colon and similar to prior examinations.  IMPRESSION: Low lung volumes without acute findings.   Electronically Signed   By: Markus Daft M.D.   On: 05/18/2014 11:20           Shanda Howells MD  Pager: [phone]

## 2014-05-18 NOTE — ED Notes (Signed)
Pt here for lower abd pain, pt sts recent had stent placed in low abd for clot in left leg and now has continue pain, has pain in neck when turning left to right and has pain in left side of torso down to hip and sob.

## 2014-05-18 NOTE — ED Provider Notes (Signed)
CSN: [id number]     Arrival date & time 05/18/14  1009 History   First MD Initiated Contact with Patient 05/18/14 1045     Chief Complaint  Patient presents with  . Abdominal Pain     (Consider location/radiation/quality/duration/timing/severity/associated sxs/prior Treatment) HPI 74 year old man presents today complaining of pain from his neck through his chest to his left flank and left hip. He states that he has had this pain for several months. It has been worse over the past week. During that time he has been seen and assessed at [facility] at which time he had a VQ scan, echocardiogram, CT of the chest abdomen and pelvis and no abnormalities were found. He was then seen by neurosurgery for the low back pain and had an MRI done per his report that was normal. He was also seen by vascular surgery and last week had angioplasty of his femoral artery. He states the pain has been ongoing and has been worse for the past week. He is taking Ultram but is almost out of this. He states he called the [place] office today and was told to come into the emergency department. Review of systems. He states that he has some headache. He denies any vision changes. He states he has some neck pain. He denies any numbness or tingling in his upper extremities. He endorses some shortness of breath but states that this is not new it has been ongoing during this problem. He has a history of pancreatic cancer but states he was told there is no evidence of recurrence on recent workup. He is not having nausea vomiting or diarrhea. His lower extremities are not painful. Past Medical History  Diagnosis Date  . Palpitations   . CAD (coronary artery disease)     a. 04/2007 s/p CABG x 4  - LIMA->LAD, VG->OM1->Om2, VG->PDA;  b. 12/2008 & 06/2010 Caths - Native multivessel dzs with 4/4 patent grafts.  . Pure hypercholesterolemia     statin intolerance  . DDD (degenerative disc disease)     several surgeries  . Carotid  bruit     a. 07/2010 U/S- 0-39% bilat ICA stenosis  . Nephrolithiasis   . HNP (herniated nucleus pulposus), lumbar     a. L2-3, s/p laminotomy, microdiskectomy 02/2009  . Pancreatic mass     a. 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 Chest pain     Noncardiac probably related to reflux  . Unspecified essential hypertension   . Pneumonia     hx of  pna  . Type II or unspecified type diabetes mellitus without mention of complication, not stated as uncontrolled     insulin dependent  . GERD (gastroesophageal reflux disease)    Past Surgical History  Procedure Laterality Date  . Re-exploratin of laminectomy  05/2008    (RT L2-3) WITH REDO MICRODISKECTOMY  . Cyst removed      FROM SPINE  . Nephrolithotomy      PERCUTANEOUS  . Coronary artery bypass graft      X 4 2009 (LIMA to LAD, SVG to first cicumflex marginal branch with sequential SVG to second cicumflex marginal branch  and saphenous vein to posterior descending coronary artery with endoscopic,vein harvest rt. lower exttremity by Dr.Owen on March 31,2009  . Cystourethroscopy      right retrograde pyelogram,manipulate stone in the renal pelvis, rt. double-j catheter.  . Pars plana vitrectomy      lt. eye,retinal photocoagulation lt. eye, membrane peel lt.  eye.  . Cholecystectomy    . Appendectomy    . Arthroscopy right knee    . Back surgery    . Knee surgery      rt knee  . Abdominal aortagram N/A 05/13/2014    Procedure: ABDOMINAL [name];  Surgeon: Wellington Hampshire, MD;  Location: [facility] CATH LAB;  Service: Cardiovascular;  Laterality: N/A;  . Percutaneous stent intervention Left 05/13/2014    Procedure: PERCUTANEOUS STENT INTERVENTION;  Surgeon: Wellington Hampshire, MD;  Location: [place] CATH LAB;  Service: Cardiovascular;  Laterality: Left;  COMMON ILLIAC   Family History  Problem Relation Age of Onset  . Heart attack Father     died of MI @ 84  . Diabetes Brother     type 2  . Other      no fh of colon  cancer  . Other Mother     died @ 70  . Coronary artery disease Brother     s/p CABG.  Alive @ 55  . Coronary artery disease Sister     alive   History  Substance Use Topics  . Smoking status: Never Smoker   . Smokeless tobacco: Never Used  . Alcohol Use: No    Review of Systems  All other systems reviewed and are negative.     Allergies  Iohexol and Statins  Home Medications   Prior to Admission medications   Medication Sig Start Date End Date Taking? Authorizing Provider  acetaminophen (TYLENOL) 325 MG tablet Take 650 mg by mouth every 6 (six) hours as needed (pain).    Historical Provider, MD  amLODipine (NORVASC) 5 MG tablet Take 5 mg by mouth daily.      Historical Provider, MD  aspirin 81 MG EC tablet Take 81 mg by mouth daily.     Historical Provider, MD  clopidogrel (PLAVIX) 75 MG tablet Take 1 tablet (75 mg total) by mouth daily. 05/13/14   Wellington Hampshire, MD  FENOFIBRATE PO Take 1 tablet by mouth daily.    Historical Provider, MD  glipiZIDE (GLUCOTROL) 10 MG tablet Take 10 mg by mouth daily.      Historical Provider, MD  insulin glargine (LANTUS SOLOSTAR) 100 UNIT/ML injection Inject 12 Units into the skin at bedtime.     Historical Provider, MD  lisinopril-hydrochlorothiazide (PRINZIDE,ZESTORETIC) 20-12.5 MG per tablet Take 1 tablet by mouth daily.      Historical Provider, MD  magnesium citrate SOLN Take 1 Bottle by mouth once.    Historical Provider, MD  Multiple Vitamin (MULITIVITAMIN WITH MINERALS) TABS Take 1 tablet by mouth daily.    Historical Provider, MD  omeprazole (PRILOSEC) 20 MG capsule Take 20 mg by mouth 2 (two) times daily before a meal. 1 tab daily 01/08/13   Historical Provider, MD  ondansetron (ZOFRAN) 4 MG tablet Take 1 tablet (4 mg total) by mouth 2 (two) times daily. As needed 05/15/14   Wellington Hampshire, MD  polyethylene glycol powder (GLYCOLAX/MIRALAX) powder Take 8.5 g by mouth daily.    Historical Provider, MD  tamsulosin (FLOMAX) 0.4 MG  CAPS capsule Take 0.4 mg by mouth daily as needed (retention). i tab daily 01/14/13   Historical Provider, MD  traMADol (ULTRAM) 50 MG tablet Take 50 mg by mouth every 6 (six) hours as needed (pain).    Historical Provider, MD   BP 146/72 mmHg  Pulse 80  Temp(Src) 97.9 F (36.6 C) (Oral)  Resp 18  Ht 5\' 7"  (1.702 m)  Wt 155  lb (70.308 kg)  BMI 24.27 kg/m2  SpO2 95% Physical Exam  Constitutional: He is oriented to person, place, and time. He appears well-developed and well-nourished.  HENT:  Head: Normocephalic and atraumatic.  Right Ear: External ear normal.  Nose: Nose normal.  Mouth/Throat: Oropharynx is clear and moist.  Eyes: Conjunctivae and EOM are normal. Pupils are equal, round, and reactive to light.  Neck: Normal range of motion. Neck supple. No JVD present. No tracheal deviation present. No thyromegaly present.  Cardiovascular: Normal rate and regular rhythm.   Pulmonary/Chest: Effort normal and breath sounds normal. Tachypnea noted. No respiratory distress.  Abdominal: Soft. Bowel sounds are normal. He exhibits no distension and no mass. There is no tenderness. There is no rebound and no guarding.  Musculoskeletal: Normal range of motion. He exhibits no edema or tenderness.  Back- no deformity, no step off, no ttp over spine, no redness or swelling Femoral pulses intact bilaterally,   Lymphadenopathy:    He has no cervical adenopathy.  Neurological: He is alert and oriented to person, place, and time. He displays normal reflexes. No cranial nerve deficit. Coordination normal.  Nursing note and vitals reviewed.   ED Course  Procedures (including critical care time) Labs Review Labs Reviewed  URINALYSIS, ROUTINE W REFLEX MICROSCOPIC - Abnormal; Notable for the following:    APPearance HAZY (*)    Glucose, UA 500 (*)    All other components within normal limits  BRAIN NATRIURETIC PEPTIDE  PROTIME-INR  CBC WITH DIFFERENTIAL/PLATELET  COMPREHENSIVE METABOLIC PANEL   LIPASE, BLOOD  D-DIMER, QUANTITATIVE  I-STAT TROPOININ, ED  Randolm Idol, ED    Imaging Review Dg Chest Port 1 View  05/18/2014   CLINICAL DATA:  Pain from the left shoulder that travels down to the left hip. Abdominal pain.  EXAM: PORTABLE CHEST - 1 VIEW  COMPARISON:  05/05/2011  FINDINGS: Chronic elevation of the right hemidiaphragm. Overall, low lung volumes. There are median sternotomy wires. No focal airspace disease or pulmonary edema. Heart size is normal. No acute bone abnormality. Again noted is gas underneath the right hemidiaphragm that is likely colon and similar to prior examinations.  IMPRESSION: Low lung volumes without acute findings.   Electronically Signed   By: Markus Daft M.D.   On: 05/18/2014 11:20     EKG Interpretation   Date/Time:  Monday May 18 2014 10:23:48 EDT Ventricular Rate:  83 PR Interval:  152 QRS Duration: 98 QT Interval:  388 QTC Calculation: 455 R Axis:   -142 Text Interpretation:  Normal sinus rhythm Incomplete right bundle branch  block Septal infarct , age undetermined Possible Lateral infarct , age  undetermined Abnormal ECG changes noted from last prior ekg of 06 May 2011 Confirmed by Kerly Rigsbee MD, Andee Poles [phone]) on 05/18/2014 11:03:59 AM      MDM   Final diagnoses:  Flank pain  Left low back pain, with sciatica presence unspecified  Chest pain, unspecified chest pain type     1:00 PM 74 y.o. Male with multiple  complaints including neck, chest, flank and hip pain. Pain began with low back pain that he was seen by neurosurgery and reports that he had a normal lumbar MRI. He was then seen and evaluated by by Dr. [name] on March 30 and had angioplasty with stent of left common iliac artery.. This improved his left anterior thigh pain. However, the other symptoms have worsened since then. CT chest abd pelvis here without acute changes to explain.  D-dimer normal for  age with low probaility for pe.  Patient with leukocytosis. Patient with  no acute findings on ct and continues to have severe pain in left flank, low back and mid back  Patient with history of pancreatic cancer and further evaluation with bone scan needed.  Patient also likely with some constipation due to pain meds.  Patient continues to be somewhat And appears very uncomfortable  Plan admission for futher evaluation and treatment.   Discussed with Dr. Ernestina Patches and he will see and evaluate.   Pattricia Boss, MD 05/18/14 [phone]

## 2014-05-18 NOTE — ED Notes (Signed)
CT notified that pt has been premedicated for dye allergy.

## 2014-05-18 NOTE — Telephone Encounter (Signed)
Received direct call from operator and spoke with pt.  Pt reports severe pain from left neck to hip.  Has been taking pain medication without relief.  Has never had pain this severe before.  Pt sounds short of breath on phone and states this started over the weekend.  Can't eat or go to the bathroom.  I instructed pt he needs to be seen in ED. Pt does not want to call EMS.  I told pt if symptoms worsen he should call EMS.  Pt agreeable to going to ED

## 2014-05-19 ENCOUNTER — Observation Stay (HOSPITAL_COMMUNITY): Payer: PPO

## 2014-05-19 ENCOUNTER — Other Ambulatory Visit (HOSPITAL_COMMUNITY): Payer: Self-pay | Admitting: Cardiology

## 2014-05-19 DIAGNOSIS — K5900 Constipation, unspecified: Secondary | ICD-10-CM | POA: Diagnosis not present

## 2014-05-19 DIAGNOSIS — R52 Pain, unspecified: Secondary | ICD-10-CM

## 2014-05-19 DIAGNOSIS — I739 Peripheral vascular disease, unspecified: Secondary | ICD-10-CM

## 2014-05-19 LAB — COMPREHENSIVE METABOLIC PANEL
ALT: 25 U/L (ref 0–53)
AST: 34 U/L (ref 0–37)
Albumin: 3.3 g/dL — ABNORMAL LOW (ref 3.5–5.2)
Alkaline Phosphatase: 48 U/L (ref 39–117)
Anion gap: 10 (ref 5–15)
BUN: 28 mg/dL — ABNORMAL HIGH (ref 6–23)
CO2: 24 mmol/L (ref 19–32)
Calcium: 9.1 mg/dL (ref 8.4–10.5)
Chloride: 97 mmol/L (ref 96–112)
Creatinine, Ser: 1.42 mg/dL — ABNORMAL HIGH (ref 0.50–1.35)
GFR calc Af Amer: 55 mL/min — ABNORMAL LOW (ref >90)
GFR calc non Af Amer: 48 mL/min — ABNORMAL LOW (ref >90)
Glucose, Bld: 426 mg/dL — ABNORMAL HIGH (ref 70–99)
Potassium: 4.7 mmol/L (ref 3.5–5.1)
Sodium: 131 mmol/L — ABNORMAL LOW (ref 135–145)
Total Bilirubin: 0.5 mg/dL (ref 0.3–1.2)
Total Protein: 7 g/dL (ref 6.0–8.3)

## 2014-05-19 LAB — CBC WITH DIFFERENTIAL/PLATELET
Basophils Absolute: 0 10*3/uL (ref 0.0–0.1)
Basophils Relative: 0 % (ref 0–1)
Eosinophils Absolute: 0 10*3/uL (ref 0.0–0.7)
Eosinophils Relative: 0 % (ref 0–5)
HCT: 39.7 % (ref 39.0–52.0)
Hemoglobin: 13.7 g/dL (ref 13.0–17.0)
Lymphocytes Relative: 5 % — ABNORMAL LOW (ref 12–46)
Lymphs Abs: 0.8 10*3/uL (ref 0.7–4.0)
MCH: 31.6 pg (ref 26.0–34.0)
MCHC: 34.5 g/dL (ref 30.0–36.0)
MCV: 91.5 fL (ref 78.0–100.0)
Monocytes Absolute: 0.3 10*3/uL (ref 0.1–1.0)
Monocytes Relative: 2 % — ABNORMAL LOW (ref 3–12)
Neutro Abs: 14.4 10*3/uL — ABNORMAL HIGH (ref 1.7–7.7)
Neutrophils Relative %: 93 % — ABNORMAL HIGH (ref 43–77)
Platelets: 290 10*3/uL (ref 150–400)
RBC: 4.34 MIL/uL (ref 4.22–5.81)
RDW: 13 % (ref 11.5–15.5)
WBC: 15.5 10*3/uL — ABNORMAL HIGH (ref 4.0–10.5)

## 2014-05-19 LAB — GLUCOSE, CAPILLARY
Glucose-Capillary: 196 mg/dL — ABNORMAL HIGH (ref 70–99)
Glucose-Capillary: 201 mg/dL — ABNORMAL HIGH (ref 70–99)
Glucose-Capillary: 241 mg/dL — ABNORMAL HIGH (ref 70–99)
Glucose-Capillary: 288 mg/dL — ABNORMAL HIGH (ref 70–99)
Glucose-Capillary: 320 mg/dL — ABNORMAL HIGH (ref 70–99)
Glucose-Capillary: 388 mg/dL — ABNORMAL HIGH (ref 70–99)

## 2014-05-19 LAB — TROPONIN I
Troponin I: <0.03 ng/mL (ref <0.031)
Troponin I: <0.03 ng/mL (ref <0.031)

## 2014-05-19 MED ORDER — INSULIN ASPART 100 UNIT/ML ~~LOC~~ SOLN
0.0000 [IU] | Freq: Three times a day (TID) | SUBCUTANEOUS | Status: DC
  Administered 2014-05-19: 5 [IU] via SUBCUTANEOUS
  Administered 2014-05-19: 11 [IU] via SUBCUTANEOUS
  Administered 2014-05-20 (×2): 8 [IU] via SUBCUTANEOUS

## 2014-05-19 MED ORDER — INSULIN ASPART 100 UNIT/ML ~~LOC~~ SOLN: 0.0000 [IU] | Freq: Every day | SUBCUTANEOUS | Status: DC

## 2014-05-19 NOTE — Evaluation (Signed)
Physical Therapy Evaluation Patient Details Name: Ricardo Le MRN: [id number] DOB: 1940-10-15 Today's Date: 05/19/2014   History of Present Illness  74 y.o. year old male with significant past medical history of CAD s/p stenting, pancreatic cancer s/p partial pancreatic resection at [facility] 2011, PVD s/p L common iliac angioplasty and stent placement 3/30, IDDM, chronic back pain presenting with pain. Patient reports he does several weeks of persistent lower extremity pain left greater than right.  Clinical Impression  Patient seen for evaluation, demonstrates modest deficits in mobility as indicated below. Increased pain is major impacting factor with mobility. Educated patient and wife extensively regarding pain management strategies in setting of arthritic pain. Discussed avoiding barefoot./hard surfaces (recommended comfortable and cushioned footwear), educated on frequency of activity rather then duration, discussed benefits and risk of different types of exercises and activity (treadmill impact vs non impact activities like bicycle or aquatics). Reviewed back precautions for comfort. Addressed behaviors that may lead to rise in pain. Discussed modality use and ther-ex for pain management. While patient may benefit from therapy services, at this time, patient feels that he is independent with mobility and has no further acute PT needs. Will sign off.      Follow Up Recommendations No PT follow up    Equipment Recommendations  None recommended by PT    Recommendations for Other Services       Precautions / Restrictions Precautions Precautions: Back (for comfort secondary to back pain)      Mobility  Bed Mobility Overal bed mobility: Independent                Transfers Overall transfer level: Independent                  Ambulation/Gait Ambulation/Gait assistance: Supervision Ambulation Distance (Feet): 255 Feet Assistive device: None Gait  Pattern/deviations: Step-through pattern;Antalgic Gait velocity: decreased with antalgia and fatigue   General Gait Details: initially [facility] for ambulation but as activity progressed patient with antalgic gait and pain in left hip reported  Stairs Stairs: Yes Stairs assistance: Supervision Stair Management: Forwards Number of Stairs: 3 General stair comments: patient slightly impuslive with stair negotiation causing loss of balance resulting in need for patient to reach out and grab railing  Wheelchair Mobility    Modified Rankin (Stroke Patients Only)       Balance Overall balance assessment: No apparent balance deficits (not formally assessed)                               Standardized Balance Assessment Standardized Balance Assessment : Dynamic Gait Index   Dynamic Gait Index Level Surface: Normal Change in Gait Speed: Normal Gait with Horizontal Head Turns: Normal Gait with Vertical Head Turns: Normal Gait and Pivot Turn: Mild Impairment Step Over Obstacle: Mild Impairment Step Around Obstacles: Normal Steps: Moderate Impairment Total Score: 20       Pertinent Vitals/Pain Pain Assessment: 0-10 Pain Score: 3  (increased to 8 with activity) Pain Location: left hip Pain Descriptors / Indicators: Aching    Home Living Family/patient expects to be discharged to:: Private residence Living Arrangements: Spouse/significant other;Children (and one daughter)   Type of Home: Mobile home Home Access: Stairs to enter Entrance Stairs-Rails: None Entrance Stairs-Number of Steps: 1 Home Layout: One level Home Equipment: Environmental consultant - 2 wheels;Bedside commode      Prior Function Level of Independence: Independent  Hand Dominance   Dominant Hand: Right    Extremity/Trunk Assessment               Lower Extremity Assessment: LLE deficits/detail   LLE Deficits / Details: LLE hip pain     Communication   Communication: No  difficulties  Cognition Arousal/Alertness: Awake/alert Behavior During Therapy: WFL for tasks assessed/performed Overall Cognitive Status: Within Functional Limits for tasks assessed                      General Comments General comments (skin integrity, edema, etc.): educated patient and wife extensively regarding pain management strategies in setting of arthritic pain. Discussed avoiding barefoot./hard surfaces (recommended comfortable and cushioned footwear), educated on frequency of activty rathern then duration, discussed benefits and risk of different types of exercises and activity (treadmill impact vs non impact activities like bicycle or aquatics). Reviewed back precautions for comfort. Addressed behaviors that may lead to rise in pain. Discussed modality use and ther-ex for pain management.    Exercises        Assessment/Plan    PT Assessment Patent does not need any further PT services  PT Diagnosis Difficulty walking;Acute pain   PT Problem List    PT Treatment Interventions     PT Goals (Current goals can be found in the Care Plan section) Acute Rehab PT Goals Patient Stated Goal: to get rid of this pain PT Goal Formulation: All assessment and education complete, DC therapy    Frequency     Barriers to discharge        Co-evaluation               End of Session Equipment Utilized During Treatment: Gait belt Activity Tolerance: Patient limited by pain Patient left: in bed;with call bell/phone within reach;with family/visitor present Nurse Communication: Mobility status         Time: [phone] (out of room for 12 minutes to print hand out) PT Time Calculation (min) (ACUTE ONLY): 38 min   Charges:   PT Evaluation $Initial PT Evaluation Tier I: 1 Procedure PT Treatments $Self Care/Home Management: 8-22   PT G CodesDuncan Dull 05/22/2014, 5:31 PM Alben Deeds, [place] DPT  [phone]

## 2014-05-19 NOTE — Care Management Note (Signed)
  Page 1 of 1   05/19/2014     8:18:54 AM CARE MANAGEMENT NOTE 05/19/2014  Patient:  Ricardo Le, Ricardo Le   Account Number:  [id number]  Date Initiated:  05/19/2014  Documentation initiated by:  Magdalen Spatz  Subjective/Objective Assessment:     Action/Plan:   Anticipated DC Date:  05/19/2014   Anticipated DC Plan:  HOME/SELF CARE         Choice offered to / List presented to:             Status of service:   Medicare Important Message given?   (If response is "NO", the following Medicare IM given date fields will be blank) Date Medicare IM given:   Medicare IM given by:   Date Additional Medicare IM given:   Additional Medicare IM given by:    Discharge Disposition:    Per UR Regulation:    If discussed at Long Length of Stay Meetings, dates discussed:    Comments:  05-19-14 Referral to assist with Lantus . Patient has insurance . Unable to assist with co pay . Magdalen Spatz RN BSN

## 2014-05-19 NOTE — Progress Notes (Signed)
Inpatient Diabetes Program Recommendations  AACE/ADA: New Consensus Statement on Inpatient Glycemic Control (2013)  Target Ranges:  Prepandial:   less than 140 mg/dL      Peak postprandial:   less than 180 mg/dL (1-2 hours)      Critically ill patients:  140 - 180 mg/dL   Noted solumedrol given times 1 yesterday.. If glucose remains in 200's, please consider the following: Inpatient Diabetes Program Recommendations Insulin - Basal: Please consider an increase in basal lantus to 15-20 units.   Thank you Rosita Kea, RN, MSN, CDE  Diabetes Inpatient Program Office: [phone] Pager: [phone] 8:00 am to 5:00 pm

## 2014-05-19 NOTE — Progress Notes (Signed)
PROGRESS NOTE  Ricardo Le SHF:[id number] DOB: 1940/03/11 DOA: 05/18/2014 PCP: Angelina Sheriff., MD  Assessment/Plan: Pain -- seems more left hip- will x ray -Duration of sxs subacute and chronic -Suspect symptoms are multifactorial in the setting of baseline peripheral vascular disease, prior disc herniation, constipation -start bowel regimen -lactate 2 -noted hx/o panreatic ca s/p partial resection -neck xray shows: Degenerative cervical spondylosis but no acute bony findings -vicodin for pain  -reviewed records from [facility]  Constipation- LARGE stool burden on CT scan -on chronic narcotics -bowel regimen  CAD  -cycle CEs -cont home regimen  PVD -s/p iliac stent 3/30 -procedural site stable -neurovascularly intact distally -follow  IDDM -cont lantus -hold orals -SSI  -A1C  Leukocytosis -unclear etiology -likely post procedural  -UA and CXR/chest CT non indicative of infection -trend  -pan culture if pt spikes fever   Elevated lipas -h/o pancreatic resection- partial  Code Status: full Family Communication: patient Disposition Plan:    Consultants:    Procedures:   HPI/Subjective: C/o pain from hip up to shoulder -started after stent placed per patient   Objective: Filed Vitals:   05/19/14 0927  BP: 120/62  Pulse:   Temp:   Resp:     Intake/Output Summary (Last 24 hours) at 05/19/14 1031 Last data filed at 05/19/14 0900  Gross per 24 hour  Intake 1229.33 ml  Output    400 ml  Net 829.33 ml   Filed Weights   05/18/14 1027 05/18/14 2130  Weight: 70.308 kg (155 lb) 70.126 kg (154 lb 9.6 oz)    Exam:   General:  Awake, NAD  Cardiovascular: rrr  Respiratory: clear  Abdomen: firm, + BS  Musculoskeletal: no edema  Data Reviewed: Basic Metabolic Panel:  Recent Labs Lab 05/18/14 1655 05/19/14 0055  NA 137 131*  K 4.3 4.7  CL 98 97  CO2 27 24  GLUCOSE 180* 426*  BUN 22 28*  CREATININE 1.08 1.42*  CALCIUM  9.6 9.1   Liver Function Tests:  Recent Labs Lab 05/18/14 1655 05/19/14 0055  AST 31 34  ALT 28 25  ALKPHOS 55 48  BILITOT 0.9 0.5  PROT 7.1 7.0  ALBUMIN 3.8 3.3*    Recent Labs Lab 05/18/14 1655  LIPASE 115*   No results for input(s): AMMONIA in the last 168 hours. CBC:  Recent Labs Lab 05/18/14 1655 05/19/14 0055  WBC 13.9* 15.5*  NEUTROABS 11.8* 14.4*  HGB 15.3 13.7  HCT 43.5 39.7  MCV 91.4 91.5  PLT 278 290   Cardiac Enzymes:  Recent Labs Lab 05/18/14 2146 05/19/14 0055 05/19/14 0658  TROPONINI <0.03 <0.03 <0.03   BNP (last 3 results)  Recent Labs  05/18/14 1105  BNP 31.4    ProBNP (last 3 results) No results for input(s): PROBNP in the last 8760 hours.  CBG:  Recent Labs Lab 05/13/14 1041 05/18/14 2141 05/19/14 0050 05/19/14 0356 05/19/14 0729  GLUCAP 240* 310* 388* 288* 241*    No results found for this or any previous visit (from the past 240 hour(s)).   Studies: Dg Cervical Spine Complete  05/18/2014   CLINICAL DATA:  Neck pain for 4 weeks.  No known injury.  EXAM: CERVICAL SPINE  4+ VIEWS  COMPARISON:  None  FINDINGS: Moderate degenerative cervical spondylosis with multilevel disc disease and facet disease. No acute fractures identified. There is multilevel bony foraminal stenosis. Carotid artery calcifications are noted. The C1-2 articulations are maintained. Bilateral cervical ribs. The lung apices are clear.  IMPRESSION: Degenerative cervical spondylosis but no acute bony findings.   Electronically Signed   By: Marijo Sanes M.D.   On: 05/18/2014 20:29   Ct Chest W Contrast  05/18/2014   CLINICAL DATA:  Lower abdominal pain. Status post stent placement last week. Pain of the body through the chest. Symptoms started 2 weeks ago. Patient received 1 hour emergency quick prep. History of hives 20 years ago.  EXAM: CT CHEST, ABDOMEN, AND PELVIS WITH CONTRAST  TECHNIQUE: Multidetector CT imaging of the chest, abdomen and pelvis was  performed following the standard protocol during bolus administration of intravenous contrast.  CONTRAST:  71mL OMNIPAQUE IOHEXOL 300 MG/ML  SOLN  COMPARISON:  Chest x-ray 05/18/2014 in CT of the abdomen and pelvis on 03/10/2014  FINDINGS: CT CHEST FINDINGS  Heart: Heart size is normal. Numerous coronary stents and extensive coronary artery calcification present. Mitral annulus calcification is noted. There is atherosclerotic calcification.  Vascular structures: Variant Aortic anatomy. The left common carotid and right brachiocephalic arteries have a common trunk. No evidence for dissection or aneurysm. The thoracic aorta is densely calcified. Previous median sternotomy.  Mediastinum/thyroid: The visualized portion of the thyroid gland has a normal appearance. No mediastinal, hilar, or axillary adenopathy.  Lungs/Airways: No pleural effusions. There is minimal subsegmental right lower lobe atelectasis. No pneumothorax.  Chest wall/osseous structures: Moderate degenerative changes in the thoracic spine.  CT ABDOMEN AND PELVIS FINDINGS  Upper abdomen: Status post cholecystectomy. There are small calcified granulomata within the liver. Status post splenectomy. Status post distal pancreatectomy. Gallbladder surgically absent. In the lower pole the right kidney there is a 6 mm calculus. No hydronephrosis or ureteral stones are identified. Left kidney is unremarkable in appearance.  Gastrointestinal tract: The stomach and small bowel loops are unremarkable in appearance. Status post appendectomy. Colonic loops are distended stool. Bowel wall is normal appeared radiopaque tablets within the cecum.  Pelvis: The prostate gland is enlarged. Urinary bladder is otherwise normal in appearance. There is no free pelvic fluid.  Retroperitoneum: There is dense atherosclerotic calcification of the abdominal aorta. No aneurysm. There is mild stranding within the left inguinal region consistent with recent arterial access. No  significant extraperitoneal hemorrhage or evidence for pseudo aneurysm.  Abdominal wall: Unremarkable.  Osseous structures: Degenerative changes are present in the lumbar spine. No suspicious lytic or blastic lesions are identified.  IMPRESSION: 1. Status post median sternotomy. 2. Coronary artery stents and extensive coronary artery disease. 3. No pneumothorax. 4. Minimal changes in the left inguinal region following presumed left inguinal arterial access. No significant hemorrhage in this region. 5. Significant stool burden. Bowel loops otherwise have a normal appearance. 6. No evidence for free intraperitoneal air or pneumothorax.   Electronically Signed   By: Nolon Nations M.D.   On: 05/18/2014 17:11   Ct Abdomen Pelvis W Contrast  05/18/2014   CLINICAL DATA:  Lower abdominal pain. Status post stent placement last week. Pain of the body through the chest. Symptoms started 2 weeks ago. Patient received 1 hour emergency quick prep. History of hives 20 years ago.  EXAM: CT CHEST, ABDOMEN, AND PELVIS WITH CONTRAST  TECHNIQUE: Multidetector CT imaging of the chest, abdomen and pelvis was performed following the standard protocol during bolus administration of intravenous contrast.  CONTRAST:  28mL OMNIPAQUE IOHEXOL 300 MG/ML  SOLN  COMPARISON:  Chest x-ray 05/18/2014 in CT of the abdomen and pelvis on 03/10/2014  FINDINGS: CT CHEST FINDINGS  Heart: Heart size is normal. Numerous coronary stents and extensive  coronary artery calcification present. Mitral annulus calcification is noted. There is atherosclerotic calcification.  Vascular structures: Variant Aortic anatomy. The left common carotid and right brachiocephalic arteries have a common trunk. No evidence for dissection or aneurysm. The thoracic aorta is densely calcified. Previous median sternotomy.  Mediastinum/thyroid: The visualized portion of the thyroid gland has a normal appearance. No mediastinal, hilar, or axillary adenopathy.  Lungs/Airways: No  pleural effusions. There is minimal subsegmental right lower lobe atelectasis. No pneumothorax.  Chest wall/osseous structures: Moderate degenerative changes in the thoracic spine.  CT ABDOMEN AND PELVIS FINDINGS  Upper abdomen: Status post cholecystectomy. There are small calcified granulomata within the liver. Status post splenectomy. Status post distal pancreatectomy. Gallbladder surgically absent. In the lower pole the right kidney there is a 6 mm calculus. No hydronephrosis or ureteral stones are identified. Left kidney is unremarkable in appearance.  Gastrointestinal tract: The stomach and small bowel loops are unremarkable in appearance. Status post appendectomy. Colonic loops are distended stool. Bowel wall is normal appeared radiopaque tablets within the cecum.  Pelvis: The prostate gland is enlarged. Urinary bladder is otherwise normal in appearance. There is no free pelvic fluid.  Retroperitoneum: There is dense atherosclerotic calcification of the abdominal aorta. No aneurysm. There is mild stranding within the left inguinal region consistent with recent arterial access. No significant extraperitoneal hemorrhage or evidence for pseudo aneurysm.  Abdominal wall: Unremarkable.  Osseous structures: Degenerative changes are present in the lumbar spine. No suspicious lytic or blastic lesions are identified.  IMPRESSION: 1. Status post median sternotomy. 2. Coronary artery stents and extensive coronary artery disease. 3. No pneumothorax. 4. Minimal changes in the left inguinal region following presumed left inguinal arterial access. No significant hemorrhage in this region. 5. Significant stool burden. Bowel loops otherwise have a normal appearance. 6. No evidence for free intraperitoneal air or pneumothorax.   Electronically Signed   By: Nolon Nations M.D.   On: 05/18/2014 17:11   Dg Chest Port 1 View  05/18/2014   CLINICAL DATA:  Pain from the left shoulder that travels down to the left hip. Abdominal  pain.  EXAM: PORTABLE CHEST - 1 VIEW  COMPARISON:  05/05/2011  FINDINGS: Chronic elevation of the right hemidiaphragm. Overall, low lung volumes. There are median sternotomy wires. No focal airspace disease or pulmonary edema. Heart size is normal. No acute bone abnormality. Again noted is gas underneath the right hemidiaphragm that is likely colon and similar to prior examinations.  IMPRESSION: Low lung volumes without acute findings.   Electronically Signed   By: Markus Daft M.D.   On: 05/18/2014 11:20    Scheduled Meds: . amLODipine  5 mg Oral Daily  . clopidogrel  75 mg Oral Daily  . heparin  5,000 Units Subcutaneous 3 times per day  . lisinopril  20 mg Oral Daily   And  . hydrochlorothiazide  12.5 mg Oral Daily  . insulin aspart  0-9 Units Subcutaneous 6 times per day  . insulin glargine  12 Units Subcutaneous QHS  . pantoprazole  40 mg Oral Daily  . polyethylene glycol  17 g Oral TID  . senna  2 tablet Oral Daily  . sodium chloride  3 mL Intravenous Q12H   Continuous Infusions: . sodium chloride 100 mL/hr at 05/19/14 0926   Antibiotics Given (last 72 hours)    None      Active Problems:   Pain    Time spent: 35 min    VANN, JESSICA  [facility] Pager  [phone]. If 7PM-7AM, please contact night-coverage at [email or web address], password [facility] 05/19/2014, 10:31 AM

## 2014-05-20 ENCOUNTER — Other Ambulatory Visit: Payer: Self-pay

## 2014-05-20 ENCOUNTER — Observation Stay (HOSPITAL_COMMUNITY): Payer: PPO

## 2014-05-20 DIAGNOSIS — G8929 Other chronic pain: Secondary | ICD-10-CM | POA: Diagnosis not present

## 2014-05-20 DIAGNOSIS — K5900 Constipation, unspecified: Secondary | ICD-10-CM | POA: Diagnosis not present

## 2014-05-20 DIAGNOSIS — I739 Peripheral vascular disease, unspecified: Secondary | ICD-10-CM | POA: Diagnosis not present

## 2014-05-20 DIAGNOSIS — R079 Chest pain, unspecified: Secondary | ICD-10-CM | POA: Diagnosis not present

## 2014-05-20 DIAGNOSIS — R52 Pain, unspecified: Secondary | ICD-10-CM | POA: Diagnosis not present

## 2014-05-20 DIAGNOSIS — E1151 Type 2 diabetes mellitus with diabetic peripheral angiopathy without gangrene: Secondary | ICD-10-CM | POA: Diagnosis not present

## 2014-05-20 LAB — COMPREHENSIVE METABOLIC PANEL
ALT: 20 U/L (ref 0–53)
AST: 19 U/L (ref 0–37)
Albumin: 3.1 g/dL — ABNORMAL LOW (ref 3.5–5.2)
Alkaline Phosphatase: 43 U/L (ref 39–117)
Anion gap: 11 (ref 5–15)
BUN: 26 mg/dL — ABNORMAL HIGH (ref 6–23)
CO2: 26 mmol/L (ref 19–32)
Calcium: 8.9 mg/dL (ref 8.4–10.5)
Chloride: 96 mmol/L (ref 96–112)
Creatinine, Ser: 0.97 mg/dL (ref 0.50–1.35)
GFR calc Af Amer: >90 mL/min (ref >90)
GFR calc non Af Amer: 81 mL/min — ABNORMAL LOW (ref >90)
Glucose, Bld: 297 mg/dL — ABNORMAL HIGH (ref 70–99)
Potassium: 4 mmol/L (ref 3.5–5.1)
Sodium: 133 mmol/L — ABNORMAL LOW (ref 135–145)
Total Bilirubin: 0.7 mg/dL (ref 0.3–1.2)
Total Protein: 6.5 g/dL (ref 6.0–8.3)

## 2014-05-20 LAB — CBC WITH DIFFERENTIAL/PLATELET
Basophils Absolute: 0 10*3/uL (ref 0.0–0.1)
Basophils Relative: 0 % (ref 0–1)
Eosinophils Absolute: 0.1 10*3/uL (ref 0.0–0.7)
Eosinophils Relative: 1 % (ref 0–5)
HCT: 37 % — ABNORMAL LOW (ref 39.0–52.0)
Hemoglobin: 12.5 g/dL — ABNORMAL LOW (ref 13.0–17.0)
Lymphocytes Relative: 23 % (ref 12–46)
Lymphs Abs: 3.3 10*3/uL (ref 0.7–4.0)
MCH: 31.3 pg (ref 26.0–34.0)
MCHC: 33.8 g/dL (ref 30.0–36.0)
MCV: 92.7 fL (ref 78.0–100.0)
Monocytes Absolute: 1.4 10*3/uL — ABNORMAL HIGH (ref 0.1–1.0)
Monocytes Relative: 10 % (ref 3–12)
Neutro Abs: 9.5 10*3/uL — ABNORMAL HIGH (ref 1.7–7.7)
Neutrophils Relative %: 66 % (ref 43–77)
Platelets: 290 10*3/uL (ref 150–400)
RBC: 3.99 MIL/uL — ABNORMAL LOW (ref 4.22–5.81)
RDW: 13.1 % (ref 11.5–15.5)
WBC: 14.3 10*3/uL — ABNORMAL HIGH (ref 4.0–10.5)

## 2014-05-20 LAB — GLUCOSE, CAPILLARY
Glucose-Capillary: 261 mg/dL — ABNORMAL HIGH (ref 70–99)
Glucose-Capillary: 251 mg/dL — ABNORMAL HIGH (ref 70–99)

## 2014-05-20 LAB — HEMOGLOBIN A1C
Hgb A1c MFr Bld: 7.7 % — ABNORMAL HIGH (ref 4.8–5.6)
Mean Plasma Glucose: 174 mg/dL

## 2014-05-20 LAB — TROPONIN I: Troponin I: <0.03 ng/mL (ref <0.031)

## 2014-05-20 MED ORDER — MORPHINE SULFATE 2 MG/ML IJ SOLN
INTRAMUSCULAR | Status: AC
  Filled 2014-05-20: qty 1

## 2014-05-20 MED ORDER — TRAMADOL HCL 50 MG PO TABS: 50.0000 mg | ORAL_TABLET | Freq: Four times a day (QID) | ORAL | Status: DC | PRN

## 2014-05-20 MED ORDER — NITROGLYCERIN 0.4 MG SL SUBL
SUBLINGUAL_TABLET | SUBLINGUAL | Status: AC
  Filled 2014-05-20: qty 1

## 2014-05-20 MED ORDER — NITROGLYCERIN 0.4 MG SL SUBL
0.4000 mg | SUBLINGUAL_TABLET | SUBLINGUAL | Status: DC | PRN
  Administered 2014-05-20 (×2): 0.4 mg via SUBLINGUAL
  Filled 2014-05-20: qty 1

## 2014-05-20 MED ORDER — ONDANSETRON HCL 4 MG/2ML IJ SOLN
4.0000 mg | Freq: Four times a day (QID) | INTRAMUSCULAR | Status: DC | PRN
  Administered 2014-05-20: 4 mg via INTRAVENOUS
  Filled 2014-05-20: qty 2

## 2014-05-20 MED ORDER — TRAMADOL HCL 50 MG PO TABS
50.0000 mg | ORAL_TABLET | Freq: Four times a day (QID) | ORAL | Status: DC | PRN
  Administered 2014-05-20: 50 mg via ORAL
  Filled 2014-05-20: qty 1

## 2014-05-20 MED ORDER — MORPHINE SULFATE 2 MG/ML IJ SOLN
2.0000 mg | Freq: Once | INTRAMUSCULAR | Status: AC
  Administered 2014-05-20: 2 mg via INTRAVENOUS

## 2014-05-20 NOTE — Progress Notes (Signed)
Comments:  Notified by RN that pt having (L) sided CP that radiates to (L) shoulder and (L) upper back. She states he is somewhat more SOB. RR RN is at the bedside and an EKG is in progress. Orders placed for NTG SL 0.4 mg x 3 and stat PCXR. I have notified by colleague Lurlean Leyden, NP on the [place] who has agreed to contact RN and will manage pt from this point forward.   Jeryl [place], NP-C [facility] Pager [phone]

## 2014-05-20 NOTE — Significant Event (Signed)
Rapid Response Event Note Called per floor RN regarding pt complaining of pain 10/10 in chest, left back and left hip.  Overview: Time Called: 0558 Arrival Time: 0605 Event Type: Cardiac  Initial Focused Assessment: Upon my arrival pt found resting in bed, severely anxious complaining of 10/10 pain in back, chest and left hip. Per Patient the pain radiates from his chest down to his hip. "It feels like a crawling pain in my back, its like water running!" [facility] NP K. Schorr paged prior to my arrival and updated per floor RN. Pt SOB, po2 100% on RA, placed on 2 LNC prior to my arrival and remains 100%. BP elevated 170/77, rr 25-30.   Interventions: EKG completed at bedside with difficulty as patient is anxious and moving around in bed. EKG yielded SR with mild ST elavation in V1 and V2 as well as ST depression in lead I. When compared to prior EKG this is unchanged and not a new finding. NTG ordered and given x 1 per K. Schorr, no improvement in pain, but resulting HA. Tylene Fantasia [facility] in house NP called and updated, additional NTG given.   Call back received from [facility] NP and EKG results discussed, troponin ordered for this am and morphine 2mg  IVP ordered stat. Morphine given at 0630. Per Livia Snellen rounding MD to see this AM.  0655-Pt complains of 3/10 pain in hip only, Denies chest pain at this time. Pt left resting in bed, Floor RN advised to notify my self and provider for worsening changes.    Event Summary: Name of Physician Notified: Lamar Blinks NP [facility] at 0600  Name of Consulting Physician Notified: Tylene Fantasia NP [facility] in house at [phone]  Outcome: Stayed in room and stabalized     [place], [place]

## 2014-05-20 NOTE — Discharge Summary (Signed)
Physician Discharge Summary  Ricardo Le PPI:[id number] DOB: 08/15/1940 DOA: 05/18/2014  PCP: Angelina Sheriff., MD  Admit date: 05/18/2014 Discharge date: 05/20/2014  Time spent: 40 minutes  Recommendations for Outpatient Follow-up:  Patient will be discharged home. He should follow-up with his primary care physician within 1-2 weeks of discharge. Patient also follow-up with his cardiologist.  Patient to continue his medications as prescribed. Patient should follow a heart healthy/carb modified diet.  Discharge Diagnoses:  Chronic pain Chest pain Constipation PVD IDDM Leukocytosis Elevated lipase  Discharge Condition: Stable  Diet recommendation: heart healthy/carb modified  Filed Weights   05/18/14 1027 05/18/14 2130  Weight: 70.308 kg (155 lb) 70.126 kg (154 lb 9.6 oz)    History of present illness:  On 05/19/2014 by Dr. Shanda Howells This is a 74 y.o. year old male with significant past medical history of CAD s/p stenting, pancreatic cancer s/p partial pancreatic resection at [facility] 2011, PVD s/p L common iliac angioplasty and stent placement 3/30, IDDM, chronic back pain presenting with pain. Patient reports he does several weeks of persistent lower extremity pain left greater than right. Patient states that he had a iliac stent placed on the 30th. Since that point lower leg pain has improved though pain is seen to migrate primary on the to the left hip as well as back. Patient states he was told by procedural physician Dr. Merideth Abbey that this would likely happen status post procedure. Has been compliant with medication regimen. Has also had some intermittent neck pain. Denies any radicular symptoms. Has had intermittent episodes of shortness of breath. Patient states he's had episodes of shortness of breath that has had a full workup within the past 3-4 years. Denies any active shortness of breath currently. Pain intermittently 9 out of 10 at times. Has been taking tramadol  at home. This is been the only alleviating factor. Denies any distal numbness or paresthesias. Reports stable blood sugars at home. Patient reports that prior to the iliac stent placement, he was unable to walk with significant pain. Patient is not able to walk with pain improved the pain seemed to have migrated. Patient calls cardiology earlier today about multiple symptoms including nausea and shortness of breath. These have since resolved. Does complain of constipation since use of tramadol. Presented to the ER temperature 97.9, heart rate in the 60s to 90s, respirations intense 30s, blood pressure in the 110s to 160s, satting 95% on room air. White blood cell count 13.9, hemoglobin 15.3, creatinine 1.08. D-dimer 0.66. Troponin negative 1. EKG normal sinus rhythm with incomplete right bundle branch block. Follow-up imaging including CT of the chest and abdomen and pelvis with contrast negative for any acute abnormalities. Noted significant stool burden. Minimal changes and left inguinal region status post stent placement.  Hospital Course:  Chronic Pain -Duration of symptoms subacute and chronic -Suspect symptoms are multifactorial in the setting of baseline peripheral vascular disease, prior disc herniation, constipation vs arthritis -Patient feels his pain has improved slightly. -He states he was able to get up and walk. -Neck x-ray showed degenerative cervical spondylosis with no acute bony findings -Hip x-ray shows bilateral hip arthritis -Continue bowel regimen and pain control -Patient did have history of pancreatic cancer with partial resection -Urged patient to have an outpatient bone scan  Chest pain -Patient currently no longer having chest pain, had episode overnight however troponin negative, EKG showed no changes -Patient would like to follow-up with his cardiologist at discharge -Continue home regimen -Possibly secondary  to patient's neck pain and cervical  spondylosis  Constipation -LARGE stool burden on CT scan -on chronic narcotics -bowel regimen  PVD -s/p iliac stent 3/30 -procedural site stable -neurovascularly intact distally  IDDM -Oral home medications held however may continue upon discharge  -Continue Lantus  -Hemoglobin A1c 7.7   Leukocytosis -unclear etiology -likely post procedural Verses reactive to pain  -UA and CXR/chest CT non indicative of infection -Patient currently afebrile   Elevated lipase -h/o pancreatic resection- partial  Procedures: None  Consultations: None  Discharge Exam: Filed Vitals:   05/20/14 0914  BP: 137/72  Pulse:   Temp:   Resp:      General: Well developed, well nourished, NAD, appears stated age  HEENT: NCAT, mucous membranes moist.  Cardiovascular: S1 S2 auscultated, RRR  Respiratory: Clear to auscultation bilaterally with equal chest rise  Abdomen: Soft, nontender, nondistended, + bowel sounds  Extremities: warm dry without cyanosis clubbing or edema  Neuro: AAOx3, nonfocal  Psych: Normal affect and demeanor with intact judgement and insight  Discharge Instructions      Discharge Instructions    Discharge instructions    Complete by:  As directed   Patient will be discharged home. He should follow-up with his primary care physician within 1-2 weeks of discharge. Patient also follow-up with his cardiologist.  Patient to continue his medications as prescribed. Patient should follow a heart healthy/carb modified diet.            Medication List    STOP taking these medications        aspirin 81 MG EC tablet     ondansetron 4 MG tablet  Commonly known as:  ZOFRAN      TAKE these medications        acetaminophen 325 MG tablet  Commonly known as:  TYLENOL  Take 650 mg by mouth every 6 (six) hours as needed (pain).     amLODipine 5 MG tablet  Commonly known as:  NORVASC  Take 5 mg by mouth daily.     clopidogrel 75 MG tablet  Commonly known as:   PLAVIX  Take 1 tablet (75 mg total) by mouth daily.     fenofibrate 145 MG tablet  Commonly known as:  TRICOR  Take 145 mg by mouth daily.     glipiZIDE 10 MG tablet  Commonly known as:  GLUCOTROL  Take 10 mg by mouth daily.     LANTUS SOLOSTAR 100 UNIT/ML injection  Generic drug:  insulin glargine  Inject 12-15 Units into the skin at bedtime.     lisinopril-hydrochlorothiazide 20-12.5 MG per tablet  Commonly known as:  PRINZIDE,ZESTORETIC  Take 1 tablet by mouth daily.     magnesium citrate Soln  Take 1 Bottle by mouth once.     multivitamin with minerals Tabs tablet  Take 1 tablet by mouth daily.     omeprazole 20 MG capsule  Commonly known as:  PRILOSEC  Take 20 mg by mouth 2 (two) times daily before a meal. 1 tab daily     polyethylene glycol powder powder  Commonly known as:  GLYCOLAX/MIRALAX  Take 8.5 g by mouth daily.     tamsulosin 0.4 MG Caps capsule  Commonly known as:  FLOMAX  Take 0.4 mg by mouth daily as needed (retention). i tab daily     traMADol 50 MG tablet  Commonly known as:  ULTRAM  Take 1 tablet (50 mg total) by mouth every 6 (six) hours as needed (pain).  Allergies  Allergen Reactions  . Iohexol      Code: HIVES, Desc: hives - pt ok with premedication, Onset Date: [id number]   . Statins     Myalgias    Follow-up Information    Follow up with [facility] Angelique Blonder., MD. Schedule an appointment as soon as possible for a visit in 1 week.   Specialty:  Family Medicine   Why:  Hospital follow up   Contact information:   [place] [address] [phone]       Follow up with Jenkins Rouge, MD. Call in 1 week.   Specialty:  Cardiology   Why:  Follow up for recent procedure   Contact information:   [address] [place] [address] [phone]        The results of significant diagnostics from this hospitalization (including imaging, microbiology, ancillary and laboratory) are listed below for  reference.    Significant Diagnostic Studies: Dg Cervical Spine Complete  05/18/2014   CLINICAL DATA:  Neck pain for 4 weeks.  No known injury.  EXAM: CERVICAL SPINE  4+ VIEWS  COMPARISON:  None  FINDINGS: Moderate degenerative cervical spondylosis with multilevel disc disease and facet disease. No acute fractures identified. There is multilevel bony foraminal stenosis. Carotid artery calcifications are noted. The C1-2 articulations are maintained. Bilateral cervical ribs. The lung apices are clear.  IMPRESSION: Degenerative cervical spondylosis but no acute bony findings.   Electronically Signed   By: Marijo Sanes M.D.   On: 05/18/2014 20:29   Ct Chest W Contrast  05/18/2014   CLINICAL DATA:  Lower abdominal pain. Status post stent placement last week. Pain of the body through the chest. Symptoms started 2 weeks ago. Patient received 1 hour emergency quick prep. History of hives 20 years ago.  EXAM: CT CHEST, ABDOMEN, AND PELVIS WITH CONTRAST  TECHNIQUE: Multidetector CT imaging of the chest, abdomen and pelvis was performed following the standard protocol during bolus administration of intravenous contrast.  CONTRAST:  51mL OMNIPAQUE IOHEXOL 300 MG/ML  SOLN  COMPARISON:  Chest x-ray 05/18/2014 in CT of the abdomen and pelvis on 03/10/2014  FINDINGS: CT CHEST FINDINGS  Heart: Heart size is normal. Numerous coronary stents and extensive coronary artery calcification present. Mitral annulus calcification is noted. There is atherosclerotic calcification.  Vascular structures: Variant Aortic anatomy. The left common carotid and right brachiocephalic arteries have a common trunk. No evidence for dissection or aneurysm. The thoracic aorta is densely calcified. Previous median sternotomy.  Mediastinum/thyroid: The visualized portion of the thyroid gland has a normal appearance. No mediastinal, hilar, or axillary adenopathy.  Lungs/Airways: No pleural effusions. There is minimal subsegmental right lower lobe  atelectasis. No pneumothorax.  Chest wall/osseous structures: Moderate degenerative changes in the thoracic spine.  CT ABDOMEN AND PELVIS FINDINGS  Upper abdomen: Status post cholecystectomy. There are small calcified granulomata within the liver. Status post splenectomy. Status post distal pancreatectomy. Gallbladder surgically absent. In the lower pole the right kidney there is a 6 mm calculus. No hydronephrosis or ureteral stones are identified. Left kidney is unremarkable in appearance.  Gastrointestinal tract: The stomach and small bowel loops are unremarkable in appearance. Status post appendectomy. Colonic loops are distended stool. Bowel wall is normal appeared radiopaque tablets within the cecum.  Pelvis: The prostate gland is enlarged. Urinary bladder is otherwise normal in appearance. There is no free pelvic fluid.  Retroperitoneum: There is dense atherosclerotic calcification of the abdominal aorta. No aneurysm. There is mild  stranding within the left inguinal region consistent with recent arterial access. No significant extraperitoneal hemorrhage or evidence for pseudo aneurysm.  Abdominal wall: Unremarkable.  Osseous structures: Degenerative changes are present in the lumbar spine. No suspicious lytic or blastic lesions are identified.  IMPRESSION: 1. Status post median sternotomy. 2. Coronary artery stents and extensive coronary artery disease. 3. No pneumothorax. 4. Minimal changes in the left inguinal region following presumed left inguinal arterial access. No significant hemorrhage in this region. 5. Significant stool burden. Bowel loops otherwise have a normal appearance. 6. No evidence for free intraperitoneal air or pneumothorax.   Electronically Signed   By: Nolon Nations M.D.   On: 05/18/2014 17:11   Ct Abdomen Pelvis W Contrast  05/18/2014   CLINICAL DATA:  Lower abdominal pain. Status post stent placement last week. Pain of the body through the chest. Symptoms started 2 weeks ago.  Patient received 1 hour emergency quick prep. History of hives 20 years ago.  EXAM: CT CHEST, ABDOMEN, AND PELVIS WITH CONTRAST  TECHNIQUE: Multidetector CT imaging of the chest, abdomen and pelvis was performed following the standard protocol during bolus administration of intravenous contrast.  CONTRAST:  77mL OMNIPAQUE IOHEXOL 300 MG/ML  SOLN  COMPARISON:  Chest x-ray 05/18/2014 in CT of the abdomen and pelvis on 03/10/2014  FINDINGS: CT CHEST FINDINGS  Heart: Heart size is normal. Numerous coronary stents and extensive coronary artery calcification present. Mitral annulus calcification is noted. There is atherosclerotic calcification.  Vascular structures: Variant Aortic anatomy. The left common carotid and right brachiocephalic arteries have a common trunk. No evidence for dissection or aneurysm. The thoracic aorta is densely calcified. Previous median sternotomy.  Mediastinum/thyroid: The visualized portion of the thyroid gland has a normal appearance. No mediastinal, hilar, or axillary adenopathy.  Lungs/Airways: No pleural effusions. There is minimal subsegmental right lower lobe atelectasis. No pneumothorax.  Chest wall/osseous structures: Moderate degenerative changes in the thoracic spine.  CT ABDOMEN AND PELVIS FINDINGS  Upper abdomen: Status post cholecystectomy. There are small calcified granulomata within the liver. Status post splenectomy. Status post distal pancreatectomy. Gallbladder surgically absent. In the lower pole the right kidney there is a 6 mm calculus. No hydronephrosis or ureteral stones are identified. Left kidney is unremarkable in appearance.  Gastrointestinal tract: The stomach and small bowel loops are unremarkable in appearance. Status post appendectomy. Colonic loops are distended stool. Bowel wall is normal appeared radiopaque tablets within the cecum.  Pelvis: The prostate gland is enlarged. Urinary bladder is otherwise normal in appearance. There is no free pelvic fluid.   Retroperitoneum: There is dense atherosclerotic calcification of the abdominal aorta. No aneurysm. There is mild stranding within the left inguinal region consistent with recent arterial access. No significant extraperitoneal hemorrhage or evidence for pseudo aneurysm.  Abdominal wall: Unremarkable.  Osseous structures: Degenerative changes are present in the lumbar spine. No suspicious lytic or blastic lesions are identified.  IMPRESSION: 1. Status post median sternotomy. 2. Coronary artery stents and extensive coronary artery disease. 3. No pneumothorax. 4. Minimal changes in the left inguinal region following presumed left inguinal arterial access. No significant hemorrhage in this region. 5. Significant stool burden. Bowel loops otherwise have a normal appearance. 6. No evidence for free intraperitoneal air or pneumothorax.   Electronically Signed   By: Nolon Nations M.D.   On: 05/18/2014 17:11   Dg Chest Port 1 View  05/20/2014   CLINICAL DATA:  Chest pain and difficulty breathing  EXAM: PORTABLE CHEST - 1 VIEW  COMPARISON:  May 18, 2014 chest radiograph and chest CT  FINDINGS: There is stable atelectasis in the bases. Lungs are otherwise clear. Heart size and pulmonary vascularity are normal. No pneumothorax. No adenopathy. Patient is status post coronary artery bypass grafting.  IMPRESSION: Mild bibasilar atelectasis. No edema or consolidation. No change in cardiac silhouette.   Electronically Signed   By: Lowella Grip III M.D.   On: 05/20/2014 07:51   Dg Chest Port 1 View  05/18/2014   CLINICAL DATA:  Pain from the left shoulder that travels down to the left hip. Abdominal pain.  EXAM: PORTABLE CHEST - 1 VIEW  COMPARISON:  05/05/2011  FINDINGS: Chronic elevation of the right hemidiaphragm. Overall, low lung volumes. There are median sternotomy wires. No focal airspace disease or pulmonary edema. Heart size is normal. No acute bone abnormality. Again noted is gas underneath the right  hemidiaphragm that is likely colon and similar to prior examinations.  IMPRESSION: Low lung volumes without acute findings.   Electronically Signed   By: Markus Daft M.D.   On: 05/18/2014 11:20   Dg Hip Unilat With Pelvis 2-3 Views Left  05/19/2014   CLINICAL DATA:  Initial encounter for left hip pain for 2 months  EXAM: LEFT HIP (WITH PELVIS) 2-3 VIEWS  COMPARISON:  None.  FINDINGS: The pelvic bones are intact. There is heavy atherosclerotic calcification of the femoral arteries bilaterally. There is moderate bilateral hip arthritis. There is diffuse osteopenia.  There is no fracture or dislocation. Both proximal femurs are intact and show normal rounded contour to both femoral heads.  IMPRESSION: Bilateral hip arthritis.  No acute abnormalities.   Electronically Signed   By: Skipper Cliche M.D.   On: 05/19/2014 13:41    Microbiology: No results found for this or any previous visit (from the past 240 hour(s)).   Labs: Basic Metabolic Panel:  Recent Labs Lab 05/18/14 1655 05/19/14 0055 05/20/14 0346  NA 137 131* 133*  K 4.3 4.7 4.0  CL 98 97 96  CO2 27 24 26   GLUCOSE 180* 426* 297*  BUN 22 28* 26*  CREATININE 1.08 1.42* 0.97  CALCIUM 9.6 9.1 8.9   Liver Function Tests:  Recent Labs Lab 05/18/14 1655 05/19/14 0055 05/20/14 0346  AST 31 34 19  ALT 28 25 20   ALKPHOS 55 48 43  BILITOT 0.9 0.5 0.7  PROT 7.1 7.0 6.5  ALBUMIN 3.8 3.3* 3.1*    Recent Labs Lab 05/18/14 1655  LIPASE 115*   No results for input(s): AMMONIA in the last 168 hours. CBC:  Recent Labs Lab 05/18/14 1655 05/19/14 0055 05/20/14 0346  WBC 13.9* 15.5* 14.3*  NEUTROABS 11.8* 14.4* 9.5*  HGB 15.3 13.7 12.5*  HCT 43.5 39.7 37.0*  MCV 91.4 91.5 92.7  PLT 278 290 290   Cardiac Enzymes:  Recent Labs Lab 05/18/14 2146 05/19/14 0055 05/19/14 0658 05/20/14 0730  TROPONINI <0.03 <0.03 <0.03 <0.03   BNP: BNP (last 3 results)  Recent Labs  05/18/14 1105  BNP 31.4    ProBNP (last 3  results) No results for input(s): PROBNP in the last 8760 hours.  CBG:  Recent Labs Lab 05/19/14 0729 05/19/14 1302 05/19/14 1730 05/19/14 2114 05/20/14 0740  GLUCAP 241* 320* 201* 196* 261*       Signed:  Cristal Ford  [facility] 05/20/2014, 10:21 AM

## 2014-05-20 NOTE — Progress Notes (Signed)
Appointment made for pt with Dr. Lin Landsman. Graceann Congress

## 2014-05-20 NOTE — Progress Notes (Signed)
Discharge instructions/ultram prescription given and explained to pt and pt's wife.  Both verbalize understanding of all orders and deny any questions at this time.  IV removed.  VSS. Pt in no s/s of distress and discharged to home with all belongings via [facility]. Graceann Congress

## 2014-05-20 NOTE — Discharge Instructions (Signed)
No specific causes of your pain were seen today. CT of the chest, abdomen and pelvis showed no acute abnormality. Please call your primary doctor for recheck tomorrow.  Return if you have fever, vomiting or other change in your symtpoms.  Chronic Pain Chronic pain can be defined as pain that is off and on and lasts for 3-6 months or longer. Many things cause chronic pain, which can make it difficult to make a diagnosis. There are many treatment options available for chronic pain. However, finding a treatment that works well for you may require trying various approaches until the right one is found. Many people benefit from a combination of two or more types of treatment to control their pain. SYMPTOMS  Chronic pain can occur anywhere in the body and can range from mild to very severe. Some types of chronic pain include:  Headache.  Low back pain.  Cancer pain.  Arthritis pain.  Neurogenic pain. This is pain resulting from damage to nerves. People with chronic pain may also have other symptoms such as:  Depression.  Anger.  Insomnia.  Anxiety. DIAGNOSIS  Your health care provider will help diagnose your condition over time. In many cases, the initial focus will be on excluding possible conditions that could be causing the pain. Depending on your symptoms, your health care provider may order tests to diagnose your condition. Some of these tests may include:   Blood tests.   CT scan.   MRI.   X-rays.   Ultrasounds.   Nerve conduction studies.  You may need to see a specialist.  TREATMENT  Finding treatment that works well may take time. You may be referred to a pain specialist. He or she may prescribe medicine or therapies, such as:   Mindful meditation or yoga.  Shots (injections) of numbing or pain-relieving medicines into the spine or area of pain.  Local electrical stimulation.  Acupuncture.   Massage therapy.   Aroma, color, light, or sound therapy.    Biofeedback.   Working with a physical therapist to keep from getting stiff.   Regular, gentle exercise.   Cognitive or behavioral therapy.   Group support.  Sometimes, surgery may be recommended.  HOME CARE INSTRUCTIONS   Take all medicines as directed by your health care provider.   Lessen stress in your life by relaxing and doing things such as listening to calming music.   Exercise or be active as directed by your health care provider.   Eat a healthy diet and include things such as vegetables, fruits, fish, and lean meats in your diet.   Keep all follow-up appointments with your health care provider.   Attend a support group with others suffering from chronic pain. SEEK MEDICAL CARE IF:   Your pain gets worse.   You develop a new pain that was not there before.   You cannot tolerate medicines given to you by your health care provider.   You have new symptoms since your last visit with your health care provider.  SEEK IMMEDIATE MEDICAL CARE IF:   You feel weak.   You have decreased sensation or numbness.   You lose control of bowel or bladder function.   Your pain suddenly gets much worse.   You develop shaking.  You develop chills.  You develop confusion.  You develop chest pain.  You develop shortness of breath.  MAKE SURE YOU:  Understand these instructions.  Will watch your condition.  Will get help right away if you are  not doing well or get worse. Document Released: 10/22/2001 Document Revised: 10/02/2012 Document Reviewed: 07/26/2012 [facility] Patient Information 2015 [place], [place]. This information is not intended to replace advice given to you by your health care provider. Make sure you discuss any questions you have with your health care provider.

## 2014-05-20 NOTE — Progress Notes (Signed)
Pt sleeping this am, in no s/s of distress currently.  Will continue to monitor. Ricardo Le

## 2014-05-20 NOTE — Progress Notes (Signed)
[place] in to check on patient was in the chair c/o sever left lower back pain and hip pain. Howard Pouch notified due to patient had already had vicodin one at 0241. Orders received for pain and nausea. Found patient up outside of door c/o sever back pain and chest pain. Patient was assisted back to bed and O2 2l/m nasal cannula started due patient c/o SOB. 0555 B/P 160/70 P76 O2 sat 99% 2l/m nasal cannula. [place] notified and rapid response nurse notified( see her notes)  Nitrogylcerin 0.4 sl given 0610 b/p 170/70 P80 patient his chest pain his easing. Forrest Moron NP call and stated to give another nitrogylcerin. EKG done and nitrogylcerin 0.4 given sl at 0615. 0616 B/P 141/60 P93 O2 sats 97 on 2l/m nasal cannula. Patient still c/o left lower back pain and hip pain and numbness. Forrest Moron NP order morphine to given IV. [phone] Morphine 2mg  given IV. Patient c/o nausea zofan 4mg  IV. 0634 B/P 131/68 P70 O2 sats 97 on 2L/m nasal cannula. Patient resting will continue to monitor.

## 2014-05-20 NOTE — Progress Notes (Signed)
Shift event: Early this am, RN called because pt was c/o left shoulder pain, left neck and hip pain. Concerned for cardiac issues. Pt has a hx of CAD with troponins neg x 3 since this admission.  This NP called and spoke to RN.  RN states she came into room and found pt OOB. He said he was SOB and this is when he c/o pain.  Pt describes the pain as 10/10 and sharp. No diaphoresis or nausea at this time. Per RN, pt is anxious. Pt has had 2 SL NTG without relief. Pt was satting normally on room air. RN placed 2L O2 for comfort and SOB resolved after pt back in bed. Morphine 2mg  given with relief of symptoms. 12 lead EKG looks similar to previous one.  CXR pending. Will go ahead and get one more troponin now.  This NP asked pt if he ever took ASA 81mg  at home with his Plavix. He states he "used to" but Dr. stopped it ? reason and now he only takes Plavix. (Hx PVD with iliac stent recently).  Believe this CP not cardiac. Pt has cervical spondylosis and chronic back and hip pain. NTG didn't help and MSO4 relieved his sx.  Report to oncoming attending at [place], NP [facility]

## 2014-05-28 DIAGNOSIS — G44209 Tension-type headache, unspecified, not intractable: Secondary | ICD-10-CM | POA: Insufficient documentation

## 2014-05-29 ENCOUNTER — Encounter (HOSPITAL_COMMUNITY): Payer: PPO

## 2014-05-29 DIAGNOSIS — R0989 Other specified symptoms and signs involving the circulatory and respiratory systems: Secondary | ICD-10-CM

## 2014-06-01 ENCOUNTER — Encounter (HOSPITAL_COMMUNITY): Payer: Self-pay | Admitting: Cardiovascular Disease

## 2014-06-02 ENCOUNTER — Encounter: Payer: PPO | Admitting: Cardiovascular Disease

## 2014-06-05 ENCOUNTER — Telehealth: Payer: Self-pay | Admitting: Cardiovascular Disease

## 2014-06-05 ENCOUNTER — Other Ambulatory Visit: Payer: Self-pay | Admitting: Neurosurgery

## 2014-06-05 DIAGNOSIS — M542 Cervicalgia: Secondary | ICD-10-CM

## 2014-06-05 NOTE — Telephone Encounter (Signed)
Left message on machine for pt to contact the office.   

## 2014-06-05 NOTE — Telephone Encounter (Signed)
New Message       Pt calling to speak to Lauren about a test he previously had done. Please call back and advise.

## 2014-06-09 ENCOUNTER — Other Ambulatory Visit: Payer: PPO

## 2014-06-10 NOTE — Telephone Encounter (Signed)
I spoke with the pt's wife and she was calling to get the pt's vascular studies and Dr [name] appointment rescheduled. The pt had been hospitalized when he was due for his follow-up. Appointments arranged.

## 2014-06-10 NOTE — Telephone Encounter (Signed)
Left message on machine for pt to contact the office.   

## 2014-06-15 ENCOUNTER — Ambulatory Visit
Admission: RE | Admit: 2014-06-15 | Discharge: 2014-06-15 | Disposition: A | Discharge status: Home / Self Care | Payer: PPO | Source: Ambulatory Visit | Attending: Neurosurgery | Admitting: Neurosurgery

## 2014-06-15 DIAGNOSIS — M542 Cervicalgia: Secondary | ICD-10-CM

## 2014-06-15 MED ORDER — TRIAMCINOLONE ACETONIDE 40 MG/ML IJ SUSP (RADIOLOGY)
60.0000 mg | Freq: Once | INTRAMUSCULAR | Status: AC
  Administered 2014-06-15: 60 mg via EPIDURAL

## 2014-06-15 MED ORDER — IOHEXOL 300 MG/ML  SOLN
1.0000 mL | Freq: Once | INTRAMUSCULAR | Status: AC | PRN
  Administered 2014-06-15: 1 mL via EPIDURAL

## 2014-06-15 NOTE — Discharge Instructions (Signed)

## 2014-06-18 ENCOUNTER — Other Ambulatory Visit: Payer: Self-pay | Admitting: Cardiovascular Disease

## 2014-06-18 DIAGNOSIS — I739 Peripheral vascular disease, unspecified: Secondary | ICD-10-CM

## 2014-06-30 ENCOUNTER — Ambulatory Visit (INDEPENDENT_AMBULATORY_CARE_PROVIDER_SITE_OTHER): Payer: PPO | Admitting: Cardiovascular Disease

## 2014-06-30 ENCOUNTER — Encounter: Payer: Self-pay | Admitting: Cardiovascular Disease

## 2014-06-30 ENCOUNTER — Ambulatory Visit (HOSPITAL_COMMUNITY): Payer: PPO | Attending: Cardiovascular Disease

## 2014-06-30 VITALS — BP 138/64 | HR 81 | Ht 67.0 in | Wt 154.0 lb

## 2014-06-30 DIAGNOSIS — I2510 Atherosclerotic heart disease of native coronary artery without angina pectoris: Secondary | ICD-10-CM | POA: Diagnosis not present

## 2014-06-30 DIAGNOSIS — I739 Peripheral vascular disease, unspecified: Secondary | ICD-10-CM

## 2014-06-30 DIAGNOSIS — I10 Essential (primary) hypertension: Secondary | ICD-10-CM | POA: Diagnosis not present

## 2014-06-30 MED ORDER — GABAPENTIN 300 MG PO CAPS: 300.0000 mg | ORAL_CAPSULE | Freq: Two times a day (BID) | ORAL | Status: DC

## 2014-06-30 NOTE — Assessment & Plan Note (Signed)
Left leg claudication improved after common iliac artery stent placement. He took Plavix for one month. Postprocedure ABI showed improvement. Obviously, it's still not normal due to known occluded SFA bilaterally. I recommend an aortoiliac duplex in 3 months. His other symptoms of neck and back pain seems to be related to cervical neuropathy and he has been evaluated by neurosurgery with more upcoming follow-up planned.  the patient is very frustrated with the amount of pain and limitations he is having. I prescribed him gabapentin 300 mg twice daily.

## 2014-06-30 NOTE — Assessment & Plan Note (Signed)
Blood pressure is reasonably controlled on current medications. 

## 2014-06-30 NOTE — Progress Notes (Signed)
Primary care physician: Dr. Lin Landsman Primary cardiologist: Dr. Johnsie Cancel  HPI  [name] is a 74 year old patient who is here today for a follow up visit regarding PAD. He has known history of coronary artery disease status post coronary artery bypass grafting in 2009 with subsequent cardiac catheterization in May 2012 revealing native multivessel disease with 4 of 4 patent grafts.  He reports previous history of pancreatic cancer 5 years ago which was treated successfully. He also reports history of retinal problems. He was told by his ophthalmologist that he should avoid blood thinners due to risk of bleeding. He has history of intolerance to statins due to myalgia. He was seen 2 years ago for mild bilateral calf claudication. His ABI was mildly reduced bilaterally at 0.7 with evidence of a short occlusion in distal SFA bilaterally.  He was not very limited at that time and was treated medically. He did not tolerate cilostazol. He was seen recently for severe left lower back pain radiating to his thigh with minimal walking. I proceeded with angiography on March 30 which showed: 1. Severe left common iliac artery stenosis. Borderline significant disease in the ostial left SFA. Subtotal occlusion in the distal SFA. Occluded tibial peroneal vessels below the knee on the left with mainly one-vessel runoff via the posterior tibial artery which reconstituts via good collaterals. 2. Occluded distal right SFA with 1 vessel runoff below the knee. 3. Successful stent placement to the left common iliac artery.  Left leg discomfort improved significantly. However, he continued to have neck and lower back pain with movements which ultimately was determined to be due to cervical neck neuropathy. He had an epidural steroid injection but he did not have any improvement in symptoms symptoms. He continues to be very limited. He had an ABI done today which showed an improvement on the left side to 0.67. ABI on the  right 0.69.   Allergies  Allergen Reactions  . Iohexol Hives    pt ok with premedication (Benadryl 50mg  PO), Onset Date: [id number]   . Statins Other (See Comments)    Myalgias      Current Outpatient Prescriptions on File Prior to Visit  Medication Sig Dispense Refill  . acetaminophen (TYLENOL) 325 MG tablet Take 650 mg by mouth every 6 (six) hours as needed (pain).    [name] Kitchen amLODipine (NORVASC) 5 MG tablet Take 5 mg by mouth daily.      . fenofibrate (TRICOR) 145 MG tablet Take 145 mg by mouth daily.    [name] Kitchen glipiZIDE (GLUCOTROL) 10 MG tablet Take 10 mg by mouth daily.      . insulin glargine (LANTUS SOLOSTAR) 100 UNIT/ML injection Inject 12-15 Units into the skin at bedtime.     [name] Kitchen lisinopril-hydrochlorothiazide (PRINZIDE,ZESTORETIC) 20-12.5 MG per tablet Take 1 tablet by mouth daily.      . magnesium citrate SOLN Take 1 Bottle by mouth once.    . Multiple Vitamin (MULITIVITAMIN WITH MINERALS) TABS Take 1 tablet by mouth daily.    [name] Kitchen omeprazole (PRILOSEC) 20 MG capsule Take 20 mg by mouth 2 (two) times daily before a meal. 1 tab daily    . polyethylene glycol powder (GLYCOLAX/MIRALAX) powder Take 8.5 g by mouth daily.    . tamsulosin (FLOMAX) 0.4 MG CAPS capsule Take 0.4 mg by mouth daily as needed (retention). i tab daily    . traMADol (ULTRAM) 50 MG tablet Take 1 tablet (50 mg total) by mouth every 6 (six) hours as needed (pain). 30 tablet  0  . clopidogrel (PLAVIX) 75 MG tablet Take 1 tablet (75 mg total) by mouth daily. (Patient not taking: Reported on 06/30/2014) 30 tablet 0  . [DISCONTINUED] dicyclomine (BENTYL) 20 MG tablet Take 1 by mouth two times daily as needed for abd. pain     . [DISCONTINUED] metFORMIN (GLUCOPHAGE) 500 MG tablet Take 500 mg by mouth daily.      . [DISCONTINUED] simvastatin (ZOCOR) 40 MG tablet Take 40 mg by mouth at bedtime.      . [DISCONTINUED] sucralfate (CARAFATE) 1 GM/10ML suspension 10cc by mouth two times a day      No current facility-administered  medications on file prior to visit.     Past Medical History  Diagnosis Date  . Palpitations   . CAD (coronary artery disease)     a. 04/2007 s/p CABG x 4  - LIMA->LAD, VG->OM1->Om2, VG->PDA;  b. 12/2008 & 06/2010 Caths - Native multivessel dzs with 4/4 patent grafts.  . Pure hypercholesterolemia     statin intolerance  . DDD (degenerative disc disease)     several surgeries  . Carotid bruit     a. 07/2010 U/S- 0-39% bilat ICA stenosis  . Nephrolithiasis   . HNP (herniated nucleus pulposus), lumbar     a. L2-3, s/p laminotomy, microdiskectomy 02/2009  . Pancreatic mass     a. 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 Chest pain     Noncardiac probably related to reflux  . Unspecified essential hypertension   . Pneumonia     hx of  pna  . Type II or unspecified type diabetes mellitus without mention of complication, not stated as uncontrolled     insulin dependent  . GERD (gastroesophageal reflux disease)      Past Surgical History  Procedure Laterality Date  . Re-exploratin of laminectomy  05/2008    (RT L2-3) WITH REDO MICRODISKECTOMY  . Cyst removed      FROM SPINE  . Nephrolithotomy      PERCUTANEOUS  . Coronary artery bypass graft      X 4 2009 (LIMA to LAD, SVG to first cicumflex marginal branch with sequential SVG to second cicumflex marginal branch  and saphenous vein to posterior descending coronary artery with endoscopic,vein harvest rt. lower exttremity by Dr.Owen on March 31,2009  . Cystourethroscopy      right retrograde pyelogram,manipulate stone in the renal pelvis, rt. double-j catheter.  . Pars plana vitrectomy      lt. eye,retinal photocoagulation lt. eye, membrane peel lt. eye.  . Cholecystectomy    . Appendectomy    . Arthroscopy right knee    . Back surgery    . Knee surgery      rt knee  . Abdominal aortagram N/A 05/13/2014    Procedure: ABDOMINAL [name];  Surgeon: Wellington Hampshire, MD;  Location: [facility] CATH LAB;  Service:  Cardiovascular;  Laterality: N/A;  . Percutaneous stent intervention Left 05/13/2014    Procedure: PERCUTANEOUS STENT INTERVENTION;  Surgeon: Wellington Hampshire, MD;  Location: [facility] CATH LAB;  Service: Cardiovascular;  Laterality: Left;  COMMON ILLIAC     Family History  Problem Relation Age of Onset  . Heart attack Father     died of MI @ 18  . Diabetes Brother     type 2  . Other      no fh of colon cancer  . Other Mother     died @ 35  . Coronary artery disease Brother  s/p CABG.  Alive @ 76  . Coronary artery disease Sister     alive     History   Social History  . Marital Status: Married    Spouse Name: N/A  . Number of Children: N/A  . Years of Education: N/A   Occupational History  . Not on file.   Social History Main Topics  . Smoking status: Never Smoker   . Smokeless tobacco: Never Used  . Alcohol Use: No  . Drug Use: No  . Sexual Activity: Not Currently   Other Topics Concern  . Not on file   Social History Narrative   Lives in [place] with wife.  Retired Company secretary.  Walks 1.26mi daily.  Does not work 2/2 back problems.   A 10 point review of system was performed. It is negative other than that mentioned in the history of present illness.   PHYSICAL EXAM   BP 138/64 mmHg  Pulse 81  Ht 5\' 7"  (1.702 m)  Wt 154 lb (69.854 kg)  BMI 24.11 kg/m2 Constitutional: He is oriented to person, place, and time. He appears well-developed and well-nourished. No distress.  HENT: No nasal discharge.  Head: Normocephalic and atraumatic.  Eyes: Pupils are equal and round. Right eye exhibits no discharge. Left eye exhibits no discharge.  Neck: Normal range of motion. Neck supple. No JVD present. No thyromegaly present. There is left carotid bruit. Cardiovascular: Normal rate, regular rhythm, normal heart sounds and. Exam reveals no gallop and no friction rub. No murmur heard.  Pulmonary/Chest: Effort normal and breath sounds normal. No stridor. No respiratory  distress. He has no wheezes. He has no rales. He exhibits no tenderness.  Abdominal: Soft. Bowel sounds are normal. He exhibits no distension. There is no tenderness. There is no rebound and no guarding.  Musculoskeletal: Normal range of motion. He exhibits no edema and no tenderness.  Neurological: He is alert and oriented to person, place, and time. Coordination normal.  Skin: Skin is warm and dry. No rash noted. He is not diaphoretic. No erythema. No pallor.  Psychiatric: He has a normal mood and affect. His behavior is normal. Judgment and thought content normal.  vascular: Femoral pulses : +2 on the right side and +1 on the left side . Distal pulses are not palpable.      ASSESSMENT AND PLAN

## 2014-06-30 NOTE — Assessment & Plan Note (Signed)
No symptoms of angina. Continue medical therapy.

## 2014-06-30 NOTE — Patient Instructions (Signed)
Medication Instructions:  Your physician has recommended you make the following change in your medication:  1. START Neurontin 300mg  take one by mouth twice a day   Labwork: No new orders.   Testing/Procedures: Your physician has recommended an aorto-iliac duplex in 3 MONTHS.   Follow-Up: Your physician wants you to follow-up in: 6 MONTHS with Dr [name].  You will receive a reminder letter in the mail two months in advance. If you don't receive a letter, please call our office to schedule the follow-up appointment.   Any Other Special Instructions Will Be Listed Below (If Applicable).

## 2014-07-01 ENCOUNTER — Other Ambulatory Visit: Payer: Self-pay | Admitting: Neurosurgery

## 2014-07-22 ENCOUNTER — Ambulatory Visit (INDEPENDENT_AMBULATORY_CARE_PROVIDER_SITE_OTHER): Payer: PPO | Admitting: Ophthalmology

## 2014-07-22 DIAGNOSIS — H43811 Vitreous degeneration, right eye: Secondary | ICD-10-CM

## 2014-07-22 DIAGNOSIS — I10 Essential (primary) hypertension: Secondary | ICD-10-CM | POA: Diagnosis not present

## 2014-07-22 DIAGNOSIS — E11359 Type 2 diabetes mellitus with proliferative diabetic retinopathy without macular edema: Secondary | ICD-10-CM

## 2014-07-22 DIAGNOSIS — E11319 Type 2 diabetes mellitus with unspecified diabetic retinopathy without macular edema: Secondary | ICD-10-CM | POA: Diagnosis not present

## 2014-07-22 DIAGNOSIS — H35033 Hypertensive retinopathy, bilateral: Secondary | ICD-10-CM | POA: Diagnosis not present

## 2014-07-23 ENCOUNTER — Encounter: Payer: Self-pay | Admitting: Internal Medicine

## 2014-07-24 ENCOUNTER — Encounter (HOSPITAL_COMMUNITY): Payer: Self-pay

## 2014-07-24 ENCOUNTER — Encounter (HOSPITAL_COMMUNITY)
Admission: RE | Admit: 2014-07-24 | Discharge: 2014-07-24 | Disposition: A | Discharge status: Home / Self Care | Payer: PPO | Source: Ambulatory Visit | Attending: Neurosurgery | Admitting: Neurosurgery

## 2014-07-24 DIAGNOSIS — Z79899 Other long term (current) drug therapy: Secondary | ICD-10-CM | POA: Diagnosis not present

## 2014-07-24 DIAGNOSIS — I10 Essential (primary) hypertension: Secondary | ICD-10-CM | POA: Insufficient documentation

## 2014-07-24 DIAGNOSIS — Z951 Presence of aortocoronary bypass graft: Secondary | ICD-10-CM | POA: Insufficient documentation

## 2014-07-24 DIAGNOSIS — K219 Gastro-esophageal reflux disease without esophagitis: Secondary | ICD-10-CM | POA: Diagnosis not present

## 2014-07-24 DIAGNOSIS — Z794 Long term (current) use of insulin: Secondary | ICD-10-CM | POA: Insufficient documentation

## 2014-07-24 DIAGNOSIS — E119 Type 2 diabetes mellitus without complications: Secondary | ICD-10-CM | POA: Diagnosis not present

## 2014-07-24 DIAGNOSIS — Z7982 Long term (current) use of aspirin: Secondary | ICD-10-CM | POA: Diagnosis not present

## 2014-07-24 DIAGNOSIS — M4800 Spinal stenosis, site unspecified: Secondary | ICD-10-CM | POA: Insufficient documentation

## 2014-07-24 DIAGNOSIS — Z01812 Encounter for preprocedural laboratory examination: Secondary | ICD-10-CM | POA: Diagnosis not present

## 2014-07-24 DIAGNOSIS — Z95820 Peripheral vascular angioplasty status with implants and grafts: Secondary | ICD-10-CM | POA: Diagnosis not present

## 2014-07-24 DIAGNOSIS — I2510 Atherosclerotic heart disease of native coronary artery without angina pectoris: Secondary | ICD-10-CM | POA: Diagnosis not present

## 2014-07-24 DIAGNOSIS — Z8507 Personal history of malignant neoplasm of pancreas: Secondary | ICD-10-CM | POA: Insufficient documentation

## 2014-07-24 DIAGNOSIS — Z01818 Encounter for other preprocedural examination: Secondary | ICD-10-CM | POA: Diagnosis not present

## 2014-07-24 DIAGNOSIS — E780 Pure hypercholesterolemia: Secondary | ICD-10-CM | POA: Insufficient documentation

## 2014-07-24 HISTORY — DX: Unspecified hearing loss, unspecified ear: H91.90

## 2014-07-24 HISTORY — DX: Malignant (primary) neoplasm, unspecified: C80.1

## 2014-07-24 LAB — CBC WITH DIFFERENTIAL/PLATELET
Basophils Absolute: 0.1 10*3/uL (ref 0.0–0.1)
Basophils Relative: 1 % (ref 0–1)
Eosinophils Absolute: 0.1 10*3/uL (ref 0.0–0.7)
Eosinophils Relative: 1 % (ref 0–5)
HCT: 44.6 % (ref 39.0–52.0)
Hemoglobin: 15.3 g/dL (ref 13.0–17.0)
Lymphocytes Relative: 23 % (ref 12–46)
Lymphs Abs: 2.1 10*3/uL (ref 0.7–4.0)
MCH: 31.5 pg (ref 26.0–34.0)
MCHC: 34.3 g/dL (ref 30.0–36.0)
MCV: 92 fL (ref 78.0–100.0)
Monocytes Absolute: 1.4 10*3/uL — ABNORMAL HIGH (ref 0.1–1.0)
Monocytes Relative: 15 % — ABNORMAL HIGH (ref 3–12)
Neutro Abs: 5.4 10*3/uL (ref 1.7–7.7)
Neutrophils Relative %: 60 % (ref 43–77)
Platelets: 346 10*3/uL (ref 150–400)
RBC: 4.85 MIL/uL (ref 4.22–5.81)
RDW: 13.2 % (ref 11.5–15.5)
WBC: 9 10*3/uL (ref 4.0–10.5)

## 2014-07-24 LAB — BASIC METABOLIC PANEL
Anion gap: 12 (ref 5–15)
BUN: 19 mg/dL (ref 6–20)
CO2: 24 mmol/L (ref 22–32)
Calcium: 9.5 mg/dL (ref 8.9–10.3)
Chloride: 99 mmol/L — ABNORMAL LOW (ref 101–111)
Creatinine, Ser: 0.95 mg/dL (ref 0.61–1.24)
GFR calc Af Amer: >60 mL/min (ref >60)
GFR calc non Af Amer: >60 mL/min (ref >60)
Glucose, Bld: 200 mg/dL — ABNORMAL HIGH (ref 65–99)
Potassium: 4.1 mmol/L (ref 3.5–5.1)
Sodium: 135 mmol/L (ref 135–145)

## 2014-07-24 LAB — GLUCOSE, CAPILLARY: Glucose-Capillary: 188 mg/dL — ABNORMAL HIGH (ref 65–99)

## 2014-07-24 LAB — SURGICAL PCR SCREEN
MRSA, PCR: POSITIVE — AB
Staphylococcus aureus: POSITIVE — AB

## 2014-07-24 NOTE — Progress Notes (Signed)
Patient has cardiac history and sees Dr. Macon Large 01/27/2014 (note in EPIC) There is also a 2/16 Note from [facility] (under [facility]) where patient had gone there with c/o SOB, chest discomfort.  ? Pneumonia: tests done were essentially negative.  He does have h/o pancreatic cancer with removal of spleen.  To date, he has no cardiac complaints, denies SOB.   Dr. [name] recently did place some stents in his legs for claudication.  DA

## 2014-07-24 NOTE — Pre-Procedure Instructions (Signed)
Ricardo Le  07/24/2014      [facility] [address] [place], [place] - [address] [place] [address] Phone: [phone] Fax: [phone]    Your procedure is scheduled on Monday, June 20th.   Report to [facility] Admitting at 6:15 A.M.  Call this number if you have problems the morning of surgery:  [phone]   Remember:  Do not eat food or drink liquids after midnight on Sunday.  Take these medicines the morning of surgery with A SIP OF WATER : Norvasc, Tramadol.        STOP taking any herbal medications or supplements, anti-inflammatories, aspirin 4-5 days prior to surgery.   Do not wear jewelry - no rings or watches  Do not wear lotions or colognes.   You may NOT wear deodorant the day of surgery.             Men may shave face and neck.   Do not bring valuables to the hospital.  [facility] is not responsible for any belongings or valuables.  Contacts, dentures or bridgework may not be worn into surgery.  Leave your suitcase in the car.  After surgery it may be brought to your room. For patients admitted to the hospital, discharge time will be determined by your treatment team.   Name and phone number of your driver:     Special instructions:  "Preparing for Surgery" instruction sheet.  Please read over the following fact sheets that you were given. Pain Booklet, Coughing and Deep Breathing, MRSA Information and Surgical Site Infection Prevention

## 2014-07-24 NOTE — Progress Notes (Signed)
Anesthesia Chart Review:  Pt is 74 year old male scheduled for C4-5, C5-6 ACDF on 07/28/14 with Dr. Annette Stable.   Cardiologist is Dr. Johnsie Cancel, last office visit 01/26/14.   PMH includes: CAD (a. 04/2007 s/p CABG x 4 - LIMA->LAD, VG->OM1->Om2, VG->PDA; b. 12/2008 & 06/2010 Caths - Native multivessel dzs with 4/4 patent grafts), carotid bruit (12/14 U/S- 1-39% bilat ICA stenosis), HTN, PAD (s/p L common iliac angioplasty and stent placement 04/2014), DM, GERD, hypercholesterolemia, pancreatic cancer. Hard of hearing. Never smoker. BMI 24.5  Medications include: amlodipine, ASA, fenofibrate, glipizide, insulin, lisinopril-hctz  Preoperative labs reviewed.    1 view CXR 05/20/2014 reviewed: Mild bibasilar atelectasis. No edema or consolidation. No change in cardiac silhouette.   EKG 05/20/2014: NSR. Left axis deviation. Pulmonary disease pattern Septal infarct, age undetermined.  Peripheral vascular angiography 05/13/2014: 1. Severe left common iliac artery stenosis. Borderline significant disease in the ostial left SFA. Subtotal occlusion in the distal SFA. Occluded tibial peroneal vessels below the knee on the left with mainly one-vessel runoff via the posterior tibial artery which reconstituts via good collaterals. 2. Occluded distal right SFA with 1 vessel runoff below the knee. 3. Successful stent placement to the left common iliac artery.  Cardiac cath 06/24/2010: 1. Severe 3-vessel native coronary artery disease. 2. All grafts are patent. 3. Normal left ventricular function.  Pt has cardiac clearance for procedure from Dr. Johnsie Cancel (found in media tab in Epic).   If no changes, I anticipate pt can proceed with surgery as scheduled.   Willeen Cass, FNP-BC [facility]/Anesthesiology Phone: [phone] 07/24/2014 4:28 PM

## 2014-07-25 LAB — HEMOGLOBIN A1C
Hgb A1c MFr Bld: 7.4 % — ABNORMAL HIGH (ref 4.8–5.6)
Mean Plasma Glucose: 166 mg/dL

## 2014-07-28 ENCOUNTER — Inpatient Hospital Stay (HOSPITAL_COMMUNITY): Payer: PPO

## 2014-07-28 ENCOUNTER — Inpatient Hospital Stay (HOSPITAL_COMMUNITY): Payer: PPO | Admitting: Emergency Medicine

## 2014-07-28 ENCOUNTER — Encounter (HOSPITAL_COMMUNITY): Payer: Self-pay | Admitting: Certified Registered Nurse Anesthetist

## 2014-07-28 ENCOUNTER — Inpatient Hospital Stay (HOSPITAL_COMMUNITY): Payer: PPO | Admitting: Certified Registered Nurse Anesthetist

## 2014-07-28 ENCOUNTER — Encounter (HOSPITAL_COMMUNITY)
Admission: RE | Disposition: A | Discharge status: Home / Self Care | Payer: Self-pay | Source: Ambulatory Visit | Attending: Neurosurgery

## 2014-07-28 ENCOUNTER — Inpatient Hospital Stay (HOSPITAL_COMMUNITY)
Admission: RE | Admit: 2014-07-28 | Discharge: 2014-07-29 | DRG: 473 | Disposition: A | Discharge status: Home / Self Care | Payer: PPO | Source: Ambulatory Visit | Attending: Neurosurgery | Admitting: Neurosurgery

## 2014-07-28 DIAGNOSIS — E780 Pure hypercholesterolemia: Secondary | ICD-10-CM | POA: Diagnosis present

## 2014-07-28 DIAGNOSIS — Z888 Allergy status to other drugs, medicaments and biological substances status: Secondary | ICD-10-CM

## 2014-07-28 DIAGNOSIS — Z794 Long term (current) use of insulin: Secondary | ICD-10-CM

## 2014-07-28 DIAGNOSIS — Z7982 Long term (current) use of aspirin: Secondary | ICD-10-CM

## 2014-07-28 DIAGNOSIS — M47812 Spondylosis without myelopathy or radiculopathy, cervical region: Secondary | ICD-10-CM | POA: Diagnosis present

## 2014-07-28 DIAGNOSIS — E119 Type 2 diabetes mellitus without complications: Secondary | ICD-10-CM | POA: Diagnosis present

## 2014-07-28 DIAGNOSIS — I2510 Atherosclerotic heart disease of native coronary artery without angina pectoris: Secondary | ICD-10-CM | POA: Diagnosis present

## 2014-07-28 DIAGNOSIS — M4802 Spinal stenosis, cervical region: Secondary | ICD-10-CM | POA: Diagnosis present

## 2014-07-28 DIAGNOSIS — Z833 Family history of diabetes mellitus: Secondary | ICD-10-CM | POA: Diagnosis not present

## 2014-07-28 DIAGNOSIS — K219 Gastro-esophageal reflux disease without esophagitis: Secondary | ICD-10-CM | POA: Diagnosis present

## 2014-07-28 DIAGNOSIS — Z95828 Presence of other vascular implants and grafts: Secondary | ICD-10-CM | POA: Diagnosis not present

## 2014-07-28 DIAGNOSIS — H9190 Unspecified hearing loss, unspecified ear: Secondary | ICD-10-CM | POA: Diagnosis present

## 2014-07-28 DIAGNOSIS — Z8249 Family history of ischemic heart disease and other diseases of the circulatory system: Secondary | ICD-10-CM | POA: Diagnosis not present

## 2014-07-28 DIAGNOSIS — I10 Essential (primary) hypertension: Secondary | ICD-10-CM | POA: Diagnosis present

## 2014-07-28 DIAGNOSIS — Z419 Encounter for procedure for purposes other than remedying health state, unspecified: Secondary | ICD-10-CM

## 2014-07-28 DIAGNOSIS — Z974 Presence of external hearing-aid: Secondary | ICD-10-CM | POA: Diagnosis not present

## 2014-07-28 DIAGNOSIS — Z951 Presence of aortocoronary bypass graft: Secondary | ICD-10-CM

## 2014-07-28 HISTORY — PX: ANTERIOR CERVICAL DECOMP/DISCECTOMY FUSION: SHX1161

## 2014-07-28 LAB — GLUCOSE, CAPILLARY
Glucose-Capillary: 118 mg/dL — ABNORMAL HIGH (ref 65–99)
Glucose-Capillary: 173 mg/dL — ABNORMAL HIGH (ref 65–99)
Glucose-Capillary: 309 mg/dL — ABNORMAL HIGH (ref 65–99)
Glucose-Capillary: 293 mg/dL — ABNORMAL HIGH (ref 65–99)

## 2014-07-28 SURGERY — ANTERIOR CERVICAL DECOMPRESSION/DISCECTOMY FUSION 2 LEVELS
Anesthesia: General

## 2014-07-28 MED ORDER — SODIUM CHLORIDE 0.9 % IJ SOLN: 3.0000 mL | INTRAMUSCULAR | Status: DC | PRN

## 2014-07-28 MED ORDER — MUPIROCIN 2 % EX OINT
1.0000 "application " | TOPICAL_OINTMENT | Freq: Two times a day (BID) | CUTANEOUS | Status: DC
  Administered 2014-07-28: 1 via NASAL

## 2014-07-28 MED ORDER — HYDROMORPHONE HCL 1 MG/ML IJ SOLN
0.2500 mg | INTRAMUSCULAR | Status: DC | PRN
  Administered 2014-07-28 (×3): 0.5 mg via INTRAVENOUS

## 2014-07-28 MED ORDER — ONDANSETRON HCL 4 MG/2ML IJ SOLN
INTRAMUSCULAR | Status: DC | PRN
  Administered 2014-07-28: 4 mg via INTRAVENOUS

## 2014-07-28 MED ORDER — PROPOFOL 10 MG/ML IV BOLUS
INTRAVENOUS | Status: DC | PRN
  Administered 2014-07-28: 130 mg via INTRAVENOUS

## 2014-07-28 MED ORDER — 0.9 % SODIUM CHLORIDE (POUR BTL) OPTIME
TOPICAL | Status: DC | PRN
  Administered 2014-07-28: 1000 mL

## 2014-07-28 MED ORDER — GLIPIZIDE 10 MG PO TABS
10.0000 mg | ORAL_TABLET | Freq: Two times a day (BID) | ORAL | Status: DC
  Administered 2014-07-28 – 2014-07-29 (×2): 10 mg via ORAL
  Filled 2014-07-28 (×4): qty 1

## 2014-07-28 MED ORDER — INSULIN GLARGINE 100 UNIT/ML ~~LOC~~ SOLN
14.0000 [IU] | Freq: Every day | SUBCUTANEOUS | Status: DC
  Administered 2014-07-28: 14 [IU] via SUBCUTANEOUS
  Filled 2014-07-28 (×2): qty 0.14

## 2014-07-28 MED ORDER — INSULIN ASPART 100 UNIT/ML ~~LOC~~ SOLN
0.0000 [IU] | Freq: Three times a day (TID) | SUBCUTANEOUS | Status: DC
  Administered 2014-07-28: 15 [IU] via SUBCUTANEOUS

## 2014-07-28 MED ORDER — EPHEDRINE SULFATE 50 MG/ML IJ SOLN
INTRAMUSCULAR | Status: AC
  Filled 2014-07-28: qty 1

## 2014-07-28 MED ORDER — NEOSTIGMINE METHYLSULFATE 10 MG/10ML IV SOLN
INTRAVENOUS | Status: AC
  Filled 2014-07-28: qty 1

## 2014-07-28 MED ORDER — FENTANYL CITRATE (PF) 100 MCG/2ML IJ SOLN
INTRAMUSCULAR | Status: DC | PRN
  Administered 2014-07-28 (×2): 50 ug via INTRAVENOUS
  Administered 2014-07-28: 100 ug via INTRAVENOUS
  Administered 2014-07-28: 150 ug via INTRAVENOUS
  Administered 2014-07-28: 50 ug via INTRAVENOUS

## 2014-07-28 MED ORDER — CHLORHEXIDINE GLUCONATE CLOTH 2 % EX PADS
6.0000 | MEDICATED_PAD | Freq: Every day | CUTANEOUS | Status: DC
  Administered 2014-07-29: 6 via TOPICAL

## 2014-07-28 MED ORDER — LIDOCAINE HCL (CARDIAC) 20 MG/ML IV SOLN
INTRAVENOUS | Status: AC
  Filled 2014-07-28: qty 5

## 2014-07-28 MED ORDER — ROCURONIUM BROMIDE 50 MG/5ML IV SOLN
INTRAVENOUS | Status: AC
  Filled 2014-07-28: qty 1

## 2014-07-28 MED ORDER — SODIUM CHLORIDE 0.9 % IV SOLN: 250.0000 mL | INTRAVENOUS | Status: DC

## 2014-07-28 MED ORDER — FENTANYL CITRATE (PF) 250 MCG/5ML IJ SOLN
INTRAMUSCULAR | Status: AC
  Filled 2014-07-28: qty 5

## 2014-07-28 MED ORDER — CEFAZOLIN SODIUM 1-5 GM-% IV SOLN
1.0000 g | Freq: Three times a day (TID) | INTRAVENOUS | Status: AC
  Administered 2014-07-28 (×2): 1 g via INTRAVENOUS
  Filled 2014-07-28 (×2): qty 50

## 2014-07-28 MED ORDER — HYDROMORPHONE HCL 1 MG/ML IJ SOLN
0.5000 mg | INTRAMUSCULAR | Status: DC | PRN
  Administered 2014-07-28: 1 mg via INTRAVENOUS
  Filled 2014-07-28: qty 1

## 2014-07-28 MED ORDER — INSULIN ASPART 100 UNIT/ML ~~LOC~~ SOLN
0.0000 [IU] | Freq: Every day | SUBCUTANEOUS | Status: DC
  Administered 2014-07-28: 3 [IU] via SUBCUTANEOUS

## 2014-07-28 MED ORDER — CYCLOBENZAPRINE HCL 10 MG PO TABS
10.0000 mg | ORAL_TABLET | Freq: Three times a day (TID) | ORAL | Status: DC | PRN
  Administered 2014-07-28: 10 mg via ORAL
  Filled 2014-07-28: qty 1

## 2014-07-28 MED ORDER — GLYCOPYRROLATE 0.2 MG/ML IJ SOLN
INTRAMUSCULAR | Status: AC
  Filled 2014-07-28: qty 3

## 2014-07-28 MED ORDER — ASPIRIN EC 81 MG PO TBEC
81.0000 mg | DELAYED_RELEASE_TABLET | Freq: Every day | ORAL | Status: DC
  Filled 2014-07-28 (×2): qty 1

## 2014-07-28 MED ORDER — LISINOPRIL-HYDROCHLOROTHIAZIDE 20-12.5 MG PO TABS: 1.0000 | ORAL_TABLET | Freq: Every day | ORAL | Status: DC

## 2014-07-28 MED ORDER — THROMBIN 5000 UNITS EX SOLR
CUTANEOUS | Status: DC | PRN
Start: 2014-07-28 — End: 2014-07-28
  Administered 2014-07-28 (×2): 5000 [IU] via TOPICAL

## 2014-07-28 MED ORDER — ONDANSETRON HCL 4 MG/2ML IJ SOLN
4.0000 mg | INTRAMUSCULAR | Status: DC | PRN
  Administered 2014-07-28 – 2014-07-29 (×3): 4 mg via INTRAVENOUS
  Filled 2014-07-28 (×3): qty 2

## 2014-07-28 MED ORDER — LACTATED RINGERS IV SOLN
INTRAVENOUS | Status: DC
  Administered 2014-07-28 (×2): via INTRAVENOUS

## 2014-07-28 MED ORDER — MIDAZOLAM HCL 5 MG/5ML IJ SOLN
INTRAMUSCULAR | Status: DC | PRN
  Administered 2014-07-28: 2 mg via INTRAVENOUS

## 2014-07-28 MED ORDER — LIDOCAINE HCL (CARDIAC) 20 MG/ML IV SOLN
INTRAVENOUS | Status: DC | PRN
  Administered 2014-07-28: 100 mg via INTRAVENOUS

## 2014-07-28 MED ORDER — MEPERIDINE HCL 25 MG/ML IJ SOLN: 6.2500 mg | INTRAMUSCULAR | Status: DC | PRN

## 2014-07-28 MED ORDER — GLYCOPYRROLATE 0.2 MG/ML IJ SOLN
INTRAMUSCULAR | Status: DC | PRN
  Administered 2014-07-28: 0.6 mg via INTRAVENOUS

## 2014-07-28 MED ORDER — MIDAZOLAM HCL 2 MG/2ML IJ SOLN
INTRAMUSCULAR | Status: AC
  Filled 2014-07-28: qty 2

## 2014-07-28 MED ORDER — HYDROMORPHONE HCL 1 MG/ML IJ SOLN
INTRAMUSCULAR | Status: AC
  Administered 2014-07-28: 0.5 mg via INTRAVENOUS
  Filled 2014-07-28: qty 1

## 2014-07-28 MED ORDER — FENOFIBRATE 160 MG PO TABS
160.0000 mg | ORAL_TABLET | Freq: Every day | ORAL | Status: DC
  Filled 2014-07-28 (×2): qty 1

## 2014-07-28 MED ORDER — DEXAMETHASONE SODIUM PHOSPHATE 10 MG/ML IJ SOLN
INTRAMUSCULAR | Status: AC
  Filled 2014-07-28: qty 1

## 2014-07-28 MED ORDER — NEOSTIGMINE METHYLSULFATE 10 MG/10ML IV SOLN
INTRAVENOUS | Status: DC | PRN
  Administered 2014-07-28: 1 mg via INTRAVENOUS
  Administered 2014-07-28: 4 mg via INTRAVENOUS

## 2014-07-28 MED ORDER — ARTIFICIAL TEARS OP OINT
TOPICAL_OINTMENT | OPHTHALMIC | Status: DC | PRN
  Administered 2014-07-28: 1 via OPHTHALMIC

## 2014-07-28 MED ORDER — TAMSULOSIN HCL 0.4 MG PO CAPS
0.4000 mg | ORAL_CAPSULE | Freq: Every day | ORAL | Status: DC
  Administered 2014-07-28: 0.4 mg via ORAL
  Filled 2014-07-28 (×2): qty 1

## 2014-07-28 MED ORDER — PHENOL 1.4 % MT LIQD
1.0000 | OROMUCOSAL | Status: DC | PRN
  Filled 2014-07-28: qty 177

## 2014-07-28 MED ORDER — ACETAMINOPHEN 325 MG PO TABS: 650.0000 mg | ORAL_TABLET | ORAL | Status: DC | PRN

## 2014-07-28 MED ORDER — ACETAMINOPHEN 650 MG RE SUPP: 650.0000 mg | RECTAL | Status: DC | PRN

## 2014-07-28 MED ORDER — AMLODIPINE BESYLATE 10 MG PO TABS
10.0000 mg | ORAL_TABLET | Freq: Every day | ORAL | Status: DC
  Filled 2014-07-28 (×2): qty 1

## 2014-07-28 MED ORDER — MENTHOL 3 MG MT LOZG: 1.0000 | LOZENGE | OROMUCOSAL | Status: DC | PRN

## 2014-07-28 MED ORDER — TRAMADOL HCL 50 MG PO TABS
50.0000 mg | ORAL_TABLET | Freq: Four times a day (QID) | ORAL | Status: DC | PRN
  Administered 2014-07-28 – 2014-07-29 (×3): 50 mg via ORAL
  Filled 2014-07-28 (×3): qty 1

## 2014-07-28 MED ORDER — SODIUM CHLORIDE 0.9 % IJ SOLN
INTRAMUSCULAR | Status: AC
  Filled 2014-07-28: qty 10

## 2014-07-28 MED ORDER — ROCURONIUM BROMIDE 100 MG/10ML IV SOLN
INTRAVENOUS | Status: DC | PRN
  Administered 2014-07-28: 50 mg via INTRAVENOUS

## 2014-07-28 MED ORDER — HEMOSTATIC AGENTS (NO CHARGE) OPTIME
TOPICAL | Status: DC | PRN
  Administered 2014-07-28: 1 via TOPICAL

## 2014-07-28 MED ORDER — SODIUM CHLORIDE 0.9 % IR SOLN
Status: DC | PRN
  Administered 2014-07-28: 500 mL

## 2014-07-28 MED ORDER — HYDROCODONE-ACETAMINOPHEN 5-325 MG PO TABS: 1.0000 | ORAL_TABLET | ORAL | Status: DC | PRN

## 2014-07-28 MED ORDER — ONDANSETRON HCL 4 MG/2ML IJ SOLN
4.0000 mg | Freq: Once | INTRAMUSCULAR | Status: AC | PRN
  Administered 2014-07-28: 4 mg via INTRAVENOUS

## 2014-07-28 MED ORDER — CEFAZOLIN SODIUM-DEXTROSE 2-3 GM-% IV SOLR
2.0000 g | INTRAVENOUS | Status: AC
  Administered 2014-07-28: 2 g via INTRAVENOUS

## 2014-07-28 MED ORDER — ARTIFICIAL TEARS OP OINT
TOPICAL_OINTMENT | OPHTHALMIC | Status: AC
  Filled 2014-07-28: qty 3.5

## 2014-07-28 MED ORDER — DEXAMETHASONE SODIUM PHOSPHATE 10 MG/ML IJ SOLN
10.0000 mg | INTRAMUSCULAR | Status: AC
  Administered 2014-07-28: 10 mg via INTRAVENOUS

## 2014-07-28 MED ORDER — HYDROCHLOROTHIAZIDE 12.5 MG PO CAPS
12.5000 mg | ORAL_CAPSULE | Freq: Every day | ORAL | Status: DC
  Filled 2014-07-28 (×2): qty 1

## 2014-07-28 MED ORDER — SODIUM CHLORIDE 0.9 % IJ SOLN: 3.0000 mL | Freq: Two times a day (BID) | INTRAMUSCULAR | Status: DC

## 2014-07-28 MED ORDER — EPHEDRINE SULFATE 50 MG/ML IJ SOLN
INTRAMUSCULAR | Status: DC | PRN
  Administered 2014-07-28 (×2): 5 mg via INTRAVENOUS

## 2014-07-28 MED ORDER — SUCCINYLCHOLINE CHLORIDE 20 MG/ML IJ SOLN
INTRAMUSCULAR | Status: AC
  Filled 2014-07-28: qty 1

## 2014-07-28 MED ORDER — INSULIN ASPART 100 UNIT/ML ~~LOC~~ SOLN
0.0000 [IU] | Freq: Three times a day (TID) | SUBCUTANEOUS | Status: DC
  Administered 2014-07-29: 4 [IU] via SUBCUTANEOUS

## 2014-07-28 MED ORDER — LISINOPRIL 20 MG PO TABS
20.0000 mg | ORAL_TABLET | Freq: Every day | ORAL | Status: DC
  Filled 2014-07-28 (×2): qty 1

## 2014-07-28 MED ORDER — ONDANSETRON HCL 4 MG/2ML IJ SOLN
INTRAMUSCULAR | Status: AC
  Administered 2014-07-28: 4 mg via INTRAVENOUS
  Filled 2014-07-28: qty 2

## 2014-07-28 MED ORDER — OXYCODONE-ACETAMINOPHEN 5-325 MG PO TABS
1.0000 | ORAL_TABLET | ORAL | Status: DC | PRN
  Filled 2014-07-28: qty 2

## 2014-07-28 MED ORDER — PROPOFOL 10 MG/ML IV BOLUS
INTRAVENOUS | Status: AC
  Filled 2014-07-28: qty 20

## 2014-07-28 SURGICAL SUPPLY — 56 items
APL SKNCLS STERI-STRIP NONHPOA (GAUZE/BANDAGES/DRESSINGS) ×1
BAG DECANTER FOR FLEXI CONT (MISCELLANEOUS) ×2 IMPLANT
BENZOIN TINCTURE PRP APPL 2/3 (GAUZE/BANDAGES/DRESSINGS) ×2 IMPLANT
BIT DRILL 13 (BIT) ×1 IMPLANT
BRUSH SCRUB EZ PLAIN DRY (MISCELLANEOUS) ×2 IMPLANT
BUR MATCHSTICK NEURO 3.0 LAGG (BURR) ×2 IMPLANT
CAGE PEEK 6X14X11 (Cage) ×2 IMPLANT
CAGE SPNL 11X14X6XRADOPQ (Cage) IMPLANT
CANISTER SUCT 3000ML PPV (MISCELLANEOUS) ×2 IMPLANT
CONT SPEC 4OZ CLIKSEAL STRL BL (MISCELLANEOUS) ×2 IMPLANT
DRAPE C-ARM 42X72 X-RAY (DRAPES) ×4 IMPLANT
DRAPE LAPAROTOMY 100X72 PEDS (DRAPES) ×2 IMPLANT
DRAPE MICROSCOPE LEICA (MISCELLANEOUS) ×2 IMPLANT
DRAPE POUCH INSTRU U-SHP 10X18 (DRAPES) ×2 IMPLANT
DRSG OPSITE POSTOP 3X4 (GAUZE/BANDAGES/DRESSINGS) ×1 IMPLANT
DURAPREP 6ML APPLICATOR 50/CS (WOUND CARE) ×2 IMPLANT
ELECT COATED BLADE 2.86 ST (ELECTRODE) ×2 IMPLANT
ELECT REM PT RETURN 9FT ADLT (ELECTROSURGICAL) ×2
ELECTRODE REM PT RTRN 9FT ADLT (ELECTROSURGICAL) ×1 IMPLANT
GAUZE SPONGE 4X4 12PLY STRL (GAUZE/BANDAGES/DRESSINGS) ×1 IMPLANT
GAUZE SPONGE 4X4 16PLY XRAY LF (GAUZE/BANDAGES/DRESSINGS) IMPLANT
GLOVE ECLIPSE 9.0 STRL (GLOVE) ×2 IMPLANT
GLOVE EXAM NITRILE LRG STRL (GLOVE) IMPLANT
GLOVE EXAM NITRILE MD LF STRL (GLOVE) IMPLANT
GLOVE EXAM NITRILE XL STR (GLOVE) IMPLANT
GLOVE EXAM NITRILE XS STR PU (GLOVE) IMPLANT
GOWN STRL REUS W/ TWL LRG LVL3 (GOWN DISPOSABLE) IMPLANT
GOWN STRL REUS W/ TWL XL LVL3 (GOWN DISPOSABLE) ×1 IMPLANT
GOWN STRL REUS W/TWL 2XL LVL3 (GOWN DISPOSABLE) IMPLANT
GOWN STRL REUS W/TWL LRG LVL3 (GOWN DISPOSABLE) ×4
GOWN STRL REUS W/TWL XL LVL3 (GOWN DISPOSABLE) ×2
HALTER HD/CHIN CERV TRACTION D (MISCELLANEOUS) ×2 IMPLANT
HEMOSTAT SURGICEL 2X14 (HEMOSTASIS) IMPLANT
KIT BASIN OR (CUSTOM PROCEDURE TRAY) ×2 IMPLANT
KIT ROOM TURNOVER OR (KITS) ×2 IMPLANT
NDL SPNL 20GX3.5 QUINCKE YW (NEEDLE) ×1 IMPLANT
NEEDLE SPNL 20GX3.5 QUINCKE YW (NEEDLE) ×2 IMPLANT
NS IRRIG 1000ML POUR BTL (IV SOLUTION) ×2 IMPLANT
PACK LAMINECTOMY NEURO (CUSTOM PROCEDURE TRAY) ×2 IMPLANT
PAD ARMBOARD 7.5X6 YLW CONV (MISCELLANEOUS) ×6 IMPLANT
PEEK CAGE 8X14X11 (Peek) ×1 IMPLANT
PLATE ELITE 42MM (Plate) ×1 IMPLANT
RUBBERBAND STERILE (MISCELLANEOUS) ×4 IMPLANT
SCREW ST 13X4XST VA NS SPNE (Screw) IMPLANT
SCREW ST VAR 4 ATL (Screw) ×12 IMPLANT
SPONGE INTESTINAL PEANUT (DISPOSABLE) ×2 IMPLANT
SPONGE SURGIFOAM ABS GEL SZ50 (HEMOSTASIS) ×2 IMPLANT
STRIP CLOSURE SKIN 1/2X4 (GAUZE/BANDAGES/DRESSINGS) ×2 IMPLANT
SUT PDS AB 5-0 P3 18 (SUTURE) ×1 IMPLANT
SUT VIC AB 3-0 SH 8-18 (SUTURE) ×2 IMPLANT
SYR 20ML ECCENTRIC (SYRINGE) ×2 IMPLANT
TAPE CLOTH 4X10 WHT NS (GAUZE/BANDAGES/DRESSINGS) ×2 IMPLANT
TOWEL OR 17X24 6PK STRL BLUE (TOWEL DISPOSABLE) ×2 IMPLANT
TOWEL OR 17X26 10 PK STRL BLUE (TOWEL DISPOSABLE) ×2 IMPLANT
TRAP SPECIMEN MUCOUS 40CC (MISCELLANEOUS) ×2 IMPLANT
WATER STERILE IRR 1000ML POUR (IV SOLUTION) ×2 IMPLANT

## 2014-07-28 NOTE — Anesthesia Preprocedure Evaluation (Signed)
Anesthesia Evaluation  Patient identified by MRN, date of birth, ID band Patient awake    Reviewed: Allergy & Precautions, NPO status , Patient's Chart, lab work & pertinent test results  Airway Mallampati: II  TM Distance: >3 FB Neck ROM: Limited    Dental   Pulmonary    Pulmonary exam normal       Cardiovascular hypertension, Pt. on medications + CAD and + CABG Normal cardiovascular exam    Neuro/Psych    GI/Hepatic GERD-  Medicated and Controlled,  Endo/Other  diabetes, Well Controlled, Type 2, Insulin Dependent  Renal/GU      Musculoskeletal   Abdominal   Peds  Hematology   Anesthesia Other Findings   Reproductive/Obstetrics                             Anesthesia Physical Anesthesia Plan  ASA: III  Anesthesia Plan: General   Post-op Pain Management:    Induction: Intravenous  Airway Management Planned: Oral ETT  Additional Equipment:   Intra-op Plan:   Post-operative Plan: Extubation in OR  Informed Consent: I have reviewed the patients History and Physical, chart, labs and discussed the procedure including the risks, benefits and alternatives for the proposed anesthesia with the patient or authorized representative who has indicated his/her understanding and acceptance.     Plan Discussed with: CRNA and Surgeon  Anesthesia Plan Comments:         Anesthesia Quick Evaluation

## 2014-07-28 NOTE — Transfer of Care (Signed)
Immediate Anesthesia Transfer of Care Note  Patient: Ricardo Le  Procedure(s) Performed: Procedure(s): ANTERIOR CERVICAL DECOMPRESSION/DISCECTOMY FUSION CERVICAL FOUR-FIVE CERVICAL FIVE-SIX  (N/A)  Patient Location: PACU  Anesthesia Type:General  Level of Consciousness: awake, patient cooperative and responds to stimulation  Airway & Oxygen Therapy: Patient Spontanous Breathing and Patient connected to face mask oxygen  Post-op Assessment: Report given to RN, Post -op Vital signs reviewed and stable and Patient moving all extremities X 4  Post vital signs: Reviewed and stable  Last Vitals:  Filed Vitals:   07/28/14 1029  BP: 181/73  Pulse:   Temp:   Resp:     Complications: No apparent anesthesia complications

## 2014-07-28 NOTE — H&P (Signed)
Ricardo Le is an 74 y.o. male.   Chief Complaint: Neck pain HPI: 74 year old male with severe left-sided neck pain with radiation into his left upper extremity failing conservative management. Workup demonstrates evidence of significant spondylosis and stenosis at C4-5 and C5-6. Patient presents for to avoid anterior cervical decompression and fusion.  Past Medical History  Diagnosis Date  . Palpitations   . CAD (coronary artery disease)     a. 04/2007 s/p CABG x 4  - LIMA->LAD, VG->OM1->Om2, VG->PDA;  b. 12/2008 & 06/2010 Caths - Native multivessel dzs with 4/4 patent grafts.  . Pure hypercholesterolemia     statin intolerance  . DDD (degenerative disc disease)     several surgeries  . Carotid bruit     a. 07/2010 U/S- 0-39% bilat ICA stenosis  . Nephrolithiasis   . HNP (herniated nucleus pulposus), lumbar     a. L2-3, s/p laminotomy, microdiskectomy 02/2009  . Pancreatic mass     a. 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 Chest pain     Noncardiac probably related to reflux  . Unspecified essential hypertension   . Pneumonia     hx of  pna  . Type II or unspecified type diabetes mellitus without mention of complication, not stated as uncontrolled     insulin dependent  . GERD (gastroesophageal reflux disease)   . Cancer     pancreatic  March 2011  . HOH (hard of hearing)     uses amplifiers     Past Surgical History  Procedure Laterality Date  . Re-exploratin of laminectomy  05/2008    (RT L2-3) WITH REDO MICRODISKECTOMY  . Cyst removed      FROM SPINE  . Nephrolithotomy      PERCUTANEOUS  . Coronary artery bypass graft      X 4 2009 (LIMA to LAD, SVG to first cicumflex marginal branch with sequential SVG to second cicumflex marginal branch  and saphenous vein to posterior descending coronary artery with endoscopic,vein harvest rt. lower exttremity by Dr.Owen on March 31,2009  . Cystourethroscopy      right retrograde pyelogram,manipulate stone  in the renal pelvis, rt. double-j catheter.  . Pars plana vitrectomy      lt. eye,retinal photocoagulation lt. eye, membrane peel lt. eye.  . Cholecystectomy    . Appendectomy    . Arthroscopy right knee    . Back surgery    . Knee surgery      rt knee  . Abdominal aortagram N/A 05/13/2014    Procedure: ABDOMINAL [name];  Surgeon: Wellington Hampshire, MD;  Location: [facility] CATH LAB;  Service: Cardiovascular;  Laterality: N/A;  . Percutaneous stent intervention Left 05/13/2014    Procedure: PERCUTANEOUS STENT INTERVENTION;  Surgeon: Wellington Hampshire, MD;  Location: [facility] CATH LAB;  Service: Cardiovascular;  Laterality: Left;  COMMON ILLIAC  . Eye surgery      Family History  Problem Relation Age of Onset  . Heart attack Father     died of MI @ 83  . Diabetes Brother     type 2  . Other      no fh of colon cancer  . Other Mother     died @ 69  . Coronary artery disease Brother     s/p CABG.  Alive @ 15  . Coronary artery disease Sister     alive   Social History:  reports that he has never smoked. He has never used smokeless  tobacco. He reports that he does not drink alcohol or use illicit drugs.  Allergies:  Allergies  Allergen Reactions  . Iohexol Hives    pt ok with premedication (Benadryl 50mg  PO), Onset Date: 42706237   . Statins Other (See Comments)    Myalgias     Medications Prior to Admission  Medication Sig Dispense Refill  . acetaminophen (TYLENOL) 325 MG tablet Take 650 mg by mouth every 6 (six) hours as needed (pain).    [name] Kitchen amLODipine (NORVASC) 10 MG tablet Take 10 mg by mouth daily.    [name] Kitchen aspirin EC 81 MG tablet Take 81 mg by mouth at bedtime.    . fenofibrate (TRICOR) 145 MG tablet Take 145 mg by mouth daily.    [name] Kitchen glipiZIDE (GLUCOTROL) 10 MG tablet Take 10 mg by mouth 2 (two) times daily.     . insulin glargine (LANTUS SOLOSTAR) 100 UNIT/ML injection Inject 12-15 Units into the skin at bedtime.     [name] Kitchen lisinopril-hydrochlorothiazide (PRINZIDE,ZESTORETIC) 20-12.5 MG  per tablet Take 1 tablet by mouth daily.      . traMADol (ULTRAM) 50 MG tablet Take 1 tablet (50 mg total) by mouth every 6 (six) hours as needed (pain). 30 tablet 0  . gabapentin (NEURONTIN) 300 MG capsule Take 1 capsule (300 mg total) by mouth 2 (two) times daily. (Patient not taking: Reported on 07/23/2014) 60 capsule 8    Results for orders placed or performed during the hospital encounter of 07/28/14 (from the past 48 hour(s))  Glucose, capillary     Status: Abnormal   Collection Time: 07/28/14  7:03 AM  Result Value Ref Range   Glucose-Capillary 118 (H) 65 - 99 mg/dL   No results found.  A comprehensive review of systems was negative.  Blood pressure 169/68, pulse 57, temperature 97.8 F (36.6 C), resp. rate 20, height 5\' 7"  (1.702 m), weight 70.761 kg (156 lb), SpO2 99 %.  Patient is awake and alert. He is oriented and appropriate. Speech is fluent. Judgment and insight are intact. Cranial nerve function intact. Motor examination 5/5 bilaterally except slight left wrist extensor weakness. Sensory examination with decreased sensation left C6. Deep tenderness and normal active. No evidence of cerebellar signs. Gait and posture reasonably normal. Examination of head ears eyes nose and throat is unremarkable. Chest and abdomen are benign. Extremities are free from injury or deformity. Assessment/Plan C4-5, C5-6 spondylosis with stenosis and radiculopathy. Plan C4-5 and C5-6 anterior cervical discectomy with interbody fusion utilizing interbody peek cages, locally harvested autograft, and anterior plate instrumentation. Risks and benefits explained. Patient wishes to proceed.  Ricardo Le A 07/28/2014, 7:51 AM

## 2014-07-28 NOTE — Brief Op Note (Signed)
07/28/2014  10:07 AM  PATIENT:  Ricardo Le  74 y.o. male  PRE-OPERATIVE DIAGNOSIS:  stenosis  POST-OPERATIVE DIAGNOSIS:  stenosis  PROCEDURE:  Procedure(s): ANTERIOR CERVICAL DECOMPRESSION/DISCECTOMY FUSION CERVICAL FOUR-FIVE CERVICAL FIVE-SIX  (N/A)  SURGEON:  Surgeon(s) and Role:    * Earnie Larsson, MD - Primary  PHYSICIAN ASSISTANT:   ASSISTANTS: Kritzer   ANESTHESIA:   general  EBL:  Total I/O In: 1000 [I.V.:1000] Out: -   BLOOD ADMINISTERED:none  DRAINS: none   LOCAL MEDICATIONS USED:  NONE  SPECIMEN:  No Specimen  DISPOSITION OF SPECIMEN:  N/A  COUNTS:  YES  TOURNIQUET:  * No tourniquets in log *  DICTATION: .Dragon Dictation  PLAN OF CARE: Admit to inpatient   PATIENT DISPOSITION:  PACU - hemodynamically stable.   Delay start of Pharmacological VTE agent (>24hrs) due to surgical blood loss or risk of bleeding: yes

## 2014-07-28 NOTE — Care Management (Addendum)
Utilization review completed by Zacaria Pousson N. Stanley Helmuth, RN BSN 

## 2014-07-28 NOTE — Op Note (Signed)
Date of procedure: 07/28/2014  Date of dictation: Same  Service: Neurosurgery  Preoperative diagnosis: C4-5, C5-6 spondylosis with stenosis  Postoperative diagnosis: Same  Procedure Name: C4-5, C5-6 anterior cervical discectomy with interbody fusion utilizing interbody peek cage, harvested autograft, and anterior plate instrumentation  Surgeon:Styles Fambro A.Khole Branch, M.D.  Asst. Surgeon: Hal Neer  Anesthesia: General  Indication: 74 year old male with severe neck and left upper extremity pain failing conservative management. Workup demonstrates evidence of marked spondylosis and stenosis at C4-5 and C5-6. Patient presents now for 2 level anterior cervical decompression and fusion in hopes of improving his symptoms.  Operative note: After induction of anesthesia, patient positioned supine with neck slightly extended and held in place with halter traction. Anterior cervical region prepped and draped sterilely. Incision made overlying C5. Dissection proceeds to the platysma. Platysma was then divided vertically and dissection proceeds on the medial border of sternocleidomastoid muscle muscle and carotid sheath. Trachea and esophagus were mobilized and retracted towards left. Arterial fascia strip of anterior spinal column. Longus cole muscles bilaterally. Retractor placed. Fluoroscopy used. Most confirmed. Disc spaces and posterior fights removed using pituitary rongeurs for tobacco: Advertising copywriter. All Mrs. of the disc were removed down 12 posterior annulus. Microscope brought into the field use at the remainder of the discectomy. Remaining aspects annulus and osteophytes removed using high-speed drill down to the level of the posterior one Schilling. Posterior lateral was elevated and dissected for fashion using Kerrison rongeurs. Underlying thecal sac was then identified. A wide central decompression is a former undercutting the bodies of C5 and C6. Decompression then proceeded  into each neural foramen. Wide anterior foraminotomies performed bilaterally. At this point a very thorough decompression had been achieved. There was no evidence of injury to thecal sac or nerve roots. Procedures and repeated C4-5 again without complication. Wound is irrigated. No traumatic anatomic peek cages were then packed with locally harvested autograft and cages were then impacted in place at the C4-5 and C5-6. Each cage was recessed slightly and anterior cortical margin. 42 mm Atlantis anterior cervical plate was then placed over the C4-C5 and C6 levels. This attachment before scopic guidance using 13 mm variable-angle screws to all 3 levels. All screws were given a final tightening and found to be solidly within the bone. Locking screws were engaged. Final images revealed good position of the bone grafts and hardware at the proper level is normal alignment of spine. Wound is then irrigated one final time. Hemostasis was ensured with bipolar cautery. Wounds and closed in layers. Steri-Strips are dressing were applied. No apparent complications. The patient tolerated the procedure well and he returns to the recovery room postop.

## 2014-07-28 NOTE — Anesthesia Postprocedure Evaluation (Signed)
Anesthesia Post Note  Patient: Ricardo Le  Procedure(s) Performed: Procedure(s) (LRB): ANTERIOR CERVICAL DECOMPRESSION/DISCECTOMY FUSION CERVICAL FOUR-FIVE CERVICAL FIVE-SIX  (N/A)  Anesthesia type: general  Patient location: PACU  Post pain: Pain level controlled  Post assessment: Patient's Cardiovascular Status Stable  Last Vitals:  Filed Vitals:   07/28/14 1303  BP: 172/71  Pulse: 97  Temp: 36.4 C  Resp: 20    Post vital signs: Reviewed and stable  Level of consciousness: sedated  Complications: No apparent anesthesia complications

## 2014-07-28 NOTE — Anesthesia Procedure Notes (Signed)
Procedure Name: Intubation Date/Time: 07/28/2014 8:19 AM Performed by: Rogers Blocker Pre-anesthesia Checklist: Patient identified, Timeout performed, Emergency Drugs available, Suction available and Patient being monitored Patient Re-evaluated:Patient Re-evaluated prior to inductionOxygen Delivery Method: Circle system utilized Preoxygenation: Pre-oxygenation with 100% oxygen Intubation Type: IV induction Ventilation: Mask ventilation without difficulty Laryngoscope Size: Mac and 4 Grade View: Grade I Tube type: Oral Number of attempts: 1 Airway Equipment and Method: Stylet Placement Confirmation: ETT inserted through vocal cords under direct vision,  breath sounds checked- equal and bilateral,  positive ETCO2 and CO2 detector Secured at: 22 cm Tube secured with: Tape Dental Injury: Teeth and Oropharynx as per pre-operative assessment

## 2014-07-29 ENCOUNTER — Encounter (HOSPITAL_COMMUNITY): Payer: Self-pay | Admitting: Neurosurgery

## 2014-07-29 LAB — GLUCOSE, CAPILLARY: Glucose-Capillary: 185 mg/dL — ABNORMAL HIGH (ref 65–99)

## 2014-07-29 MED ORDER — TRAMADOL HCL 50 MG PO TABS: 50.0000 mg | ORAL_TABLET | Freq: Four times a day (QID) | ORAL | Status: DC | PRN

## 2014-07-29 NOTE — Discharge Instructions (Signed)

## 2014-07-29 NOTE — Progress Notes (Signed)
Pt doing well. Pt given D/C instructions with Rx's, verbal understanding was provided. Pt's IV was removed prior to D/C. Pt's incision is covered with Honeycomb dressing and some a scant amount of drainage. Pt D/C'd home via wheelchair @ 1030 per MD order. Pt is stable @ D/C and has no other needs at this time. Holli Humbles, RN

## 2014-07-29 NOTE — Discharge Summary (Signed)
Physician Discharge Summary  Patient ID: [name] MRN: [id number] DOB/AGE: 73/14/1942 73 y.o.  Admit date: 07/28/2014 Discharge date: 07/29/2014  Admission Diagnoses:  Discharge Diagnoses:  Principal Problem:   Cervical spondylosis without myelopathy Active Problems:   Cervical spondylosis   Discharged Condition: good  Hospital Course: The patient was admitted to the hospital where he underwent a two-level anterior cervical decompression and fusion without complications. Postoperatively he is doing well. His preoperative neck and upper extremity pain has resolved. He is ambulating without difficulty and is ready with discharge home.  Consults:   Significant Diagnostic Studies:   Treatments:   Discharge Exam: Blood pressure 153/68, pulse 81, temperature 98.4 F (36.9 C), temperature source Oral, resp. rate 18, height 5\' 7"  (1.702 m), weight 70.761 kg (156 lb), SpO2 96 %. Awake and alert. Oriented and appropriate. Motor and sensory function intact. Wound clean and dry. Chest and abdomen benign.  Disposition: 01-Home or Self Care     Medication List    TAKE these medications        acetaminophen 325 MG tablet  Commonly known as:  TYLENOL  Take 650 mg by mouth every 6 (six) hours as needed (pain).     amLODipine 10 MG tablet  Commonly known as:  NORVASC  Take 10 mg by mouth daily.     aspirin EC 81 MG tablet  Take 81 mg by mouth at bedtime.     fenofibrate 145 MG tablet  Commonly known as:  TRICOR  Take 145 mg by mouth daily.     gabapentin 300 MG capsule  Commonly known as:  NEURONTIN  Take 1 capsule (300 mg total) by mouth 2 (two) times daily.     glipiZIDE 10 MG tablet  Commonly known as:  GLUCOTROL  Take 10 mg by mouth 2 (two) times daily.     LANTUS SOLOSTAR 100 UNIT/ML injection  Generic drug:  insulin glargine  Inject 12-15 Units into the skin at bedtime.     lisinopril-hydrochlorothiazide 20-12.5 MG per tablet  Commonly known as:   PRINZIDE,ZESTORETIC  Take 1 tablet by mouth daily.     traMADol 50 MG tablet  Commonly known as:  ULTRAM  Take 1-2 tablets (50-100 mg total) by mouth every 6 (six) hours as needed (pain).           Follow-up Information    Follow up with Ryelynn Guedea A, MD In 2 weeks.   Specialty:  Neurosurgery   Contact information:   [address] [place] [address] [phone]       Signed: Charlie Pitter 07/29/2014, 8:15 AM

## 2014-09-23 ENCOUNTER — Other Ambulatory Visit: Payer: Self-pay | Admitting: Cardiovascular Disease

## 2014-09-23 DIAGNOSIS — I739 Peripheral vascular disease, unspecified: Secondary | ICD-10-CM

## 2014-09-30 ENCOUNTER — Encounter (HOSPITAL_COMMUNITY): Payer: PPO

## 2014-10-01 ENCOUNTER — Encounter (HOSPITAL_COMMUNITY): Payer: PPO

## 2014-10-01 ENCOUNTER — Ambulatory Visit (HOSPITAL_COMMUNITY)
Admission: RE | Admit: 2014-10-01 | Discharge: 2014-10-01 | Disposition: A | Discharge status: Home / Self Care | Payer: PPO | Source: Ambulatory Visit | Attending: Cardiology | Admitting: Cardiology

## 2014-10-01 ENCOUNTER — Inpatient Hospital Stay (HOSPITAL_COMMUNITY): Admission: RE | Admit: 2014-10-01 | Payer: PPO | Source: Ambulatory Visit

## 2014-10-01 DIAGNOSIS — E119 Type 2 diabetes mellitus without complications: Secondary | ICD-10-CM | POA: Diagnosis not present

## 2014-10-01 DIAGNOSIS — Z95828 Presence of other vascular implants and grafts: Secondary | ICD-10-CM | POA: Diagnosis not present

## 2014-10-01 DIAGNOSIS — I10 Essential (primary) hypertension: Secondary | ICD-10-CM | POA: Diagnosis not present

## 2014-10-01 DIAGNOSIS — I739 Peripheral vascular disease, unspecified: Secondary | ICD-10-CM | POA: Diagnosis not present

## 2014-10-01 DIAGNOSIS — I2510 Atherosclerotic heart disease of native coronary artery without angina pectoris: Secondary | ICD-10-CM | POA: Insufficient documentation

## 2014-10-01 DIAGNOSIS — Z951 Presence of aortocoronary bypass graft: Secondary | ICD-10-CM | POA: Insufficient documentation

## 2014-10-01 DIAGNOSIS — E785 Hyperlipidemia, unspecified: Secondary | ICD-10-CM | POA: Diagnosis not present

## 2015-01-21 ENCOUNTER — Ambulatory Visit (INDEPENDENT_AMBULATORY_CARE_PROVIDER_SITE_OTHER): Payer: PPO | Admitting: Ophthalmology

## 2015-01-26 ENCOUNTER — Ambulatory Visit (INDEPENDENT_AMBULATORY_CARE_PROVIDER_SITE_OTHER): Payer: PPO | Admitting: Cardiovascular Disease

## 2015-01-26 ENCOUNTER — Encounter: Payer: Self-pay | Admitting: Cardiovascular Disease

## 2015-01-26 VITALS — BP 130/70 | HR 63 | Ht 67.0 in | Wt 163.8 lb

## 2015-01-26 DIAGNOSIS — I2510 Atherosclerotic heart disease of native coronary artery without angina pectoris: Secondary | ICD-10-CM | POA: Diagnosis not present

## 2015-01-26 DIAGNOSIS — I739 Peripheral vascular disease, unspecified: Secondary | ICD-10-CM | POA: Diagnosis not present

## 2015-01-26 DIAGNOSIS — I10 Essential (primary) hypertension: Secondary | ICD-10-CM

## 2015-01-26 DIAGNOSIS — E785 Hyperlipidemia, unspecified: Secondary | ICD-10-CM | POA: Diagnosis not present

## 2015-01-26 NOTE — Assessment & Plan Note (Signed)
He is currently on fenofibrate once daily. He has history of intolerance to statins. He reports that statins caused him some aching but that might have been related to some other etiology. The patient might be willing to try a statin again especially with his extensive atherosclerosis. Rosuvastatin might be a good option if that has not been tried. I am forwarding this to his primary care physician.

## 2015-01-26 NOTE — Assessment & Plan Note (Signed)
Blood pressure is reasonably controlled on current medications. 

## 2015-01-26 NOTE — Patient Instructions (Signed)
Medication Instructions:  Your physician recommends that you continue on your current medications as directed. Please refer to the Current Medication list given to you today.  Labwork: No new orders.   Testing/Procedures: Your physician has requested that you have an aorto-iliac duplex in 6 MONTHS. Do not eat after midnight the day before and avoid carbonated beverages  Follow-Up: Your physician wants you to follow-up in: 6 MONTHS with Dr [name].  You will receive a reminder letter in the mail two months in advance. If you don't receive a letter, please call our office to schedule the follow-up appointment.   Any Other Special Instructions Will Be Listed Below (If Applicable).     If you need a refill on your cardiac medications before your next appointment, please call your pharmacy.

## 2015-01-26 NOTE — Progress Notes (Signed)
Primary care physician: Dr. Lin Landsman Primary cardiologist: Dr. Johnsie Cancel  HPI  [name] is a 74 year old patient who is here today for a follow up visit regarding PAD. He has known history of coronary artery disease status post coronary artery bypass grafting in 2009 with subsequent cardiac catheterization in May 2012 revealing native multivessel disease with 4 of 4 patent grafts.   other medical problems include pancreatic cancer 5 years ago which was treated successfully. He also reports history of retinal problems. He was told by his ophthalmologist that he should avoid blood thinners due to risk of bleeding. He has history of intolerance to statins due to myalgia.   he is status post left common iliac artery stent placement in March 2016. He is known to have bilateral SFA occlusion with 1 vessel runoff below the knee bilaterally.  he continued to have neuropathic pain and ultimately underwent neck surgery in June of this year with improvement in symptoms.  he has been doing reasonably well overall with no recurrent claudication. Most recent noninvasive vascular evaluation showed a ABI of 0.71  Bilaterally with patent left common iliac stent.  He overexerted himself recently and he thinks he pulled a muscle in his back. He is having some back pain as a result.  he is known to have mild bilateral carotid disease and will be getting carotid Doppler next month.  Allergies  Allergen Reactions  . Rosuvastatin Calcium Other (See Comments)    Myalgias  . Iohexol Hives    pt ok with premedication (Benadryl 50mg  PO), Onset Date: [id number]   . Statins Other (See Comments)    Myalgias      Current Outpatient Prescriptions on File Prior to Visit  Medication Sig Dispense Refill  . acetaminophen (TYLENOL) 325 MG tablet Take 650 mg by mouth every 6 (six) hours as needed (pain).    [name] Kitchen amLODipine (NORVASC) 10 MG tablet Take 10 mg by mouth daily.    [name] Kitchen aspirin EC 81 MG tablet Take 81 mg by mouth at  bedtime.    . fenofibrate (TRICOR) 145 MG tablet Take 145 mg by mouth daily.    [name] Kitchen gabapentin (NEURONTIN) 300 MG capsule Take 1 capsule (300 mg total) by mouth 2 (two) times daily. 60 capsule 8  . glipiZIDE (GLUCOTROL) 10 MG tablet Take 10 mg by mouth 2 (two) times daily.     . insulin glargine (LANTUS SOLOSTAR) 100 UNIT/ML injection Inject 12-15 Units into the skin at bedtime.     [name] Kitchen lisinopril-hydrochlorothiazide (PRINZIDE,ZESTORETIC) 20-12.5 MG per tablet Take 1 tablet by mouth daily.      . [DISCONTINUED] dicyclomine (BENTYL) 20 MG tablet Take 1 by mouth two times daily as needed for abd. pain     . [DISCONTINUED] metFORMIN (GLUCOPHAGE) 500 MG tablet Take 500 mg by mouth daily.      . [DISCONTINUED] simvastatin (ZOCOR) 40 MG tablet Take 40 mg by mouth at bedtime.      . [DISCONTINUED] sucralfate (CARAFATE) 1 GM/10ML suspension 10cc by mouth two times a day      No current facility-administered medications on file prior to visit.     Past Medical History  Diagnosis Date  . Palpitations   . CAD (coronary artery disease)     a. 04/2007 s/p CABG x 4  - LIMA->LAD, VG->OM1->Om2, VG->PDA;  b. 12/2008 & 06/2010 Caths - Native multivessel dzs with 4/4 patent grafts.  . Pure hypercholesterolemia     statin intolerance  . DDD (degenerative disc  disease)     several surgeries  . Carotid bruit     a. 07/2010 U/S- 0-39% bilat ICA stenosis  . Nephrolithiasis   . HNP (herniated nucleus pulposus), lumbar     a. L2-3, s/p laminotomy, microdiskectomy 02/2009  . Pancreatic mass     a. 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 Chest pain     Noncardiac probably related to reflux  . Unspecified essential hypertension   . Pneumonia     hx of  pna  . Type II or unspecified type diabetes mellitus without mention of complication, not stated as uncontrolled     insulin dependent  . GERD (gastroesophageal reflux disease)   . Cancer [facility])     pancreatic  March 2011  . HOH (hard of  hearing)     uses amplifiers      Past Surgical History  Procedure Laterality Date  . Re-exploratin of laminectomy  05/2008    (RT L2-3) WITH REDO MICRODISKECTOMY  . Cyst removed      FROM SPINE  . Nephrolithotomy      PERCUTANEOUS  . Coronary artery bypass graft      X 4 2009 (LIMA to LAD, SVG to first cicumflex marginal branch with sequential SVG to second cicumflex marginal branch  and saphenous vein to posterior descending coronary artery with endoscopic,vein harvest rt. lower exttremity by Dr.Owen on March 31,2009  . Cystourethroscopy      right retrograde pyelogram,manipulate stone in the renal pelvis, rt. double-j catheter.  . Pars plana vitrectomy      lt. eye,retinal photocoagulation lt. eye, membrane peel lt. eye.  . Cholecystectomy    . Appendectomy    . Arthroscopy right knee    . Back surgery    . Knee surgery      rt knee  . Abdominal aortagram N/A 05/13/2014    Procedure: ABDOMINAL [name];  Surgeon: Wellington Hampshire, MD;  Location: [facility] CATH LAB;  Service: Cardiovascular;  Laterality: N/A;  . Percutaneous stent intervention Left 05/13/2014    Procedure: PERCUTANEOUS STENT INTERVENTION;  Surgeon: Wellington Hampshire, MD;  Location: [facility] CATH LAB;  Service: Cardiovascular;  Laterality: Left;  COMMON ILLIAC  . Eye surgery    . Anterior cervical decomp/discectomy fusion N/A 07/28/2014    Procedure: ANTERIOR CERVICAL DECOMPRESSION/DISCECTOMY FUSION CERVICAL FOUR-FIVE CERVICAL FIVE-SIX ;  Surgeon: Earnie Larsson, MD;  Location: [facility]O [facility];  Service: Neurosurgery;  Laterality: N/A;     Family History  Problem Relation Age of Onset  . Heart attack Father     died of MI @ 14  . Diabetes Brother     type 2  . Other      no fh of colon cancer  . Other Mother     died @ 68  . Coronary artery disease Brother     s/p CABG.  Alive @ 65  . Coronary artery disease Sister     alive     Social History   Social History  . Marital Status: Married    Spouse Name: N/A  . Number  of Children: N/A  . Years of Education: N/A   Occupational History  . Not on file.   Social History Main Topics  . Smoking status: Never Smoker   . Smokeless tobacco: Never Used  . Alcohol Use: No  . Drug Use: No  . Sexual Activity: Not Currently   Other Topics Concern  . Not on file   Social  History Narrative   Lives in [place] with wife.  Retired Company secretary.  Walks 1.90mi daily.  Does not work 2/2 back problems.   A 10 point review of system was performed. It is negative other than that mentioned in the history of present illness.   PHYSICAL EXAM   BP 130/70 mmHg  Pulse 63  Ht 5\' 7"  (1.702 m)  Wt 163 lb 12.8 oz (74.299 kg)  BMI 25.65 kg/m2 Constitutional: He is oriented to person, place, and time. He appears well-developed and well-nourished. No distress.  HENT: No nasal discharge.  Head: Normocephalic and atraumatic.  Eyes: Pupils are equal and round. Right eye exhibits no discharge. Left eye exhibits no discharge.  Neck: Normal range of motion. Neck supple. No JVD present. No thyromegaly present.  Bilateral carotid bruits. Cardiovascular: Normal rate, regular rhythm, normal heart sounds and. Exam reveals no gallop and no friction rub. No murmur heard.  Pulmonary/Chest: Effort normal and breath sounds normal. No stridor. No respiratory distress. He has no wheezes. He has no rales. He exhibits no tenderness.  Abdominal: Soft. Bowel sounds are normal. He exhibits no distension. There is no tenderness. There is no rebound and no guarding.  Musculoskeletal: Normal range of motion. He exhibits no edema and no tenderness.  Neurological: He is alert and oriented to person, place, and time. Coordination normal.  Skin: Skin is warm and dry. No rash noted. He is not diaphoretic. No erythema. No pallor.  Psychiatric: He has a normal mood and affect. His behavior is normal. Judgment and thought content normal.  vascular: Femoral pulses : +2 on the right side and +1 on the left side .  Distal pulses are not palpable.      ASSESSMENT AND PLAN

## 2015-01-26 NOTE — Assessment & Plan Note (Signed)
He reports no anginal symptoms at the present time.

## 2015-01-26 NOTE — Assessment & Plan Note (Signed)
He is stable from a vascular standpoint with patent left common iliac artery stent. He is known to have bilateral SFA occlusion but does not have significant Claudication. Continue medical therapy and repeat aortoiliac duplex in 6 months.

## 2015-01-28 ENCOUNTER — Ambulatory Visit (INDEPENDENT_AMBULATORY_CARE_PROVIDER_SITE_OTHER): Payer: PPO | Admitting: Ophthalmology

## 2015-01-28 DIAGNOSIS — H35033 Hypertensive retinopathy, bilateral: Secondary | ICD-10-CM

## 2015-01-28 DIAGNOSIS — E113593 Type 2 diabetes mellitus with proliferative diabetic retinopathy without macular edema, bilateral: Secondary | ICD-10-CM | POA: Diagnosis not present

## 2015-01-28 DIAGNOSIS — H43811 Vitreous degeneration, right eye: Secondary | ICD-10-CM

## 2015-01-28 DIAGNOSIS — I10 Essential (primary) hypertension: Secondary | ICD-10-CM | POA: Diagnosis not present

## 2015-01-28 DIAGNOSIS — E11319 Type 2 diabetes mellitus with unspecified diabetic retinopathy without macular edema: Secondary | ICD-10-CM

## 2015-02-03 ENCOUNTER — Other Ambulatory Visit: Payer: Self-pay | Admitting: Cardiovascular Disease

## 2015-02-03 DIAGNOSIS — I6523 Occlusion and stenosis of bilateral carotid arteries: Secondary | ICD-10-CM

## 2015-02-17 ENCOUNTER — Ambulatory Visit (HOSPITAL_COMMUNITY)
Admission: RE | Admit: 2015-02-17 | Discharge: 2015-02-17 | Disposition: A | Discharge status: Home / Self Care | Payer: PPO | Source: Ambulatory Visit | Attending: Cardiovascular Disease | Admitting: Cardiovascular Disease

## 2015-02-17 DIAGNOSIS — I10 Essential (primary) hypertension: Secondary | ICD-10-CM | POA: Diagnosis not present

## 2015-02-17 DIAGNOSIS — I6523 Occlusion and stenosis of bilateral carotid arteries: Secondary | ICD-10-CM | POA: Insufficient documentation

## 2015-02-17 DIAGNOSIS — E119 Type 2 diabetes mellitus without complications: Secondary | ICD-10-CM | POA: Diagnosis not present

## 2015-02-17 DIAGNOSIS — E7800 Pure hypercholesterolemia, unspecified: Secondary | ICD-10-CM | POA: Insufficient documentation

## 2015-02-19 ENCOUNTER — Telehealth: Payer: Self-pay | Admitting: Cardiovascular Disease

## 2015-02-19 NOTE — Telephone Encounter (Signed)
Called about lab results. Patient aware.

## 2015-02-19 NOTE — Telephone Encounter (Signed)
New message ° ° ° ° °Returning a call to the nurse °

## 2015-02-19 NOTE — Telephone Encounter (Signed)
Patient is aware of results.

## 2015-02-19 NOTE — Telephone Encounter (Signed)
F/u  Pt wife returning rn phone call/ Please call back and discuss.

## 2015-02-19 NOTE — Telephone Encounter (Signed)
Left message to call back  

## 2015-02-21 NOTE — Progress Notes (Signed)
Patient ID: Ricardo Le, male   DOB: 1940-12-07, 75 y.o.   MRN: [id number]     Primary care physician: Dr. Lin Landsman Primary cardiologist: Dr. Johnsie Cancel  HPI  [name] is a 75 y.o.  patient who is here today for a follow up CAD/CABG and PVD  He has known history of coronary artery disease status post coronary artery bypass grafting in 2009 with subsequent cardiac catheterization in May 2012 revealing native multivessel disease with 4 of 4 patent grafts.   Other medical problems include pancreatic cancer 5 years ago which was treated successfully. He also reports history of retinal problems. He was told by his ophthalmologist that he should avoid blood thinners due to risk of bleeding.  He has history of intolerance to statins due to myalgia.   He is status post left common iliac artery stent placement in March 2016. He is known to have bilateral SFA occlusion with 1 vessel runoff below the knee bilaterally. He has been doing reasonably well overall with no recurrent claudication. Most recent noninvasive vascular evaluation showed a ABI of 0.71  Bilaterally with patent left common iliac stent.  He is known to have mild bilateral carotid disease and will be getting carotid Doppler next month.  Having more exertional dyspnea and tightness Walks a mile / day but slow pace Has not taken any nitro  Allergies  Allergen Reactions  . Rosuvastatin Calcium Other (See Comments)    Myalgias  . Iohexol Hives    pt ok with premedication (Benadryl 50mg  PO), Onset Date: [id number]   . Statins Other (See Comments)    Myalgias      Current Outpatient Prescriptions on File Prior to Visit  Medication Sig Dispense Refill  . acetaminophen (TYLENOL) 325 MG tablet Take 650 mg by mouth every 6 (six) hours as needed (pain).    [name] Kitchen amLODipine (NORVASC) 10 MG tablet Take 10 mg by mouth daily.    [name] Kitchen aspirin EC 81 MG tablet Take 81 mg by mouth at bedtime.    . fenofibrate (TRICOR) 145 MG tablet Take 145 mg by  mouth daily.    [name] Kitchen gabapentin (NEURONTIN) 300 MG capsule Take 1 capsule (300 mg total) by mouth 2 (two) times daily. 60 capsule 8  . glipiZIDE (GLUCOTROL) 10 MG tablet Take 10 mg by mouth 2 (two) times daily.     . insulin glargine (LANTUS SOLOSTAR) 100 UNIT/ML injection Inject 12-15 Units into the skin at bedtime.     [name] Kitchen lisinopril-hydrochlorothiazide (PRINZIDE,ZESTORETIC) 20-12.5 MG per tablet Take 1 tablet by mouth daily.      . [DISCONTINUED] dicyclomine (BENTYL) 20 MG tablet Take 1 by mouth two times daily as needed for abd. pain     . [DISCONTINUED] metFORMIN (GLUCOPHAGE) 500 MG tablet Take 500 mg by mouth daily.      . [DISCONTINUED] simvastatin (ZOCOR) 40 MG tablet Take 40 mg by mouth at bedtime.      . [DISCONTINUED] sucralfate (CARAFATE) 1 GM/10ML suspension 10cc by mouth two times a day      No current facility-administered medications on file prior to visit.     Past Medical History  Diagnosis Date  . Palpitations   . CAD (coronary artery disease)     a. 04/2007 s/p CABG x 4  - LIMA->LAD, VG->OM1->Om2, VG->PDA;  b. 12/2008 & 06/2010 Caths - Native multivessel dzs with 4/4 patent grafts.  . Pure hypercholesterolemia     statin intolerance  . DDD (degenerative disc disease)  several surgeries  . Carotid bruit     a. 07/2010 U/S- 0-39% bilat ICA stenosis  . Nephrolithiasis   . HNP (herniated nucleus pulposus), lumbar     a. L2-3, s/p laminotomy, microdiskectomy 02/2009  . Pancreatic mass     a. 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 Chest pain     Noncardiac probably related to reflux  . Unspecified essential hypertension   . Pneumonia     hx of  pna  . Type II or unspecified type diabetes mellitus without mention of complication, not stated as uncontrolled     insulin dependent  . GERD (gastroesophageal reflux disease)   . Cancer [facility])     pancreatic  March 2011  . HOH (hard of hearing)     uses amplifiers      Past Surgical History    Procedure Laterality Date  . Re-exploratin of laminectomy  05/2008    (RT L2-3) WITH REDO MICRODISKECTOMY  . Cyst removed      FROM SPINE  . Nephrolithotomy      PERCUTANEOUS  . Coronary artery bypass graft      X 4 2009 (LIMA to LAD, SVG to first cicumflex marginal branch with sequential SVG to second cicumflex marginal branch  and saphenous vein to posterior descending coronary artery with endoscopic,vein harvest rt. lower exttremity by Dr.Owen on March 31,2009  . Cystourethroscopy      right retrograde pyelogram,manipulate stone in the renal pelvis, rt. double-j catheter.  . Pars plana vitrectomy      lt. eye,retinal photocoagulation lt. eye, membrane peel lt. eye.  . Cholecystectomy    . Appendectomy    . Arthroscopy right knee    . Back surgery    . Knee surgery      rt knee  . Abdominal aortagram N/A 05/13/2014    Procedure: ABDOMINAL [name];  Surgeon: Wellington Hampshire, MD;  Location: [facility] CATH LAB;  Service: Cardiovascular;  Laterality: N/A;  . Percutaneous stent intervention Left 05/13/2014    Procedure: PERCUTANEOUS STENT INTERVENTION;  Surgeon: Wellington Hampshire, MD;  Location: [facility] CATH LAB;  Service: Cardiovascular;  Laterality: Left;  COMMON ILLIAC  . Eye surgery    . Anterior cervical decomp/discectomy fusion N/A 07/28/2014    Procedure: ANTERIOR CERVICAL DECOMPRESSION/DISCECTOMY FUSION CERVICAL FOUR-FIVE CERVICAL FIVE-SIX ;  Surgeon: Earnie Larsson, MD;  Location: [place] NEURO ORS;  Service: Neurosurgery;  Laterality: N/A;     Family History  Problem Relation Age of Onset  . Heart attack Father     died of MI @ 75  . Diabetes Brother     type 2  . Other      no fh of colon cancer  . Other Mother     died @ 79  . Coronary artery disease Brother     s/p CABG.  Alive @ 39  . Coronary artery disease Sister     alive     Social History   Social History  . Marital Status: Married    Spouse Name: N/A  . Number of Children: N/A  . Years of Education: N/A    Occupational History  . Not on file.   Social History Main Topics  . Smoking status: Never Smoker   . Smokeless tobacco: Never Used  . Alcohol Use: No  . Drug Use: No  . Sexual Activity: Not Currently   Other Topics Concern  . Not on file   Social History Narrative  Lives in [place] with wife.  Retired Company secretary.  Walks 1.48mi daily.  Does not work 2/2 back problems.    A 10 point review of system was performed. It is negative other than that mentioned in the history of present illness.  ECG:  05/20/14  SR rate 88 LAD poor R wave progression ? Old anterior MI   PHYSICAL EXAM   BP 132/54 mmHg  Pulse 64  Ht 5\' 7"  (1.702 m)  Wt 74.753 kg (164 lb 12.8 oz)  BMI 25.81 kg/m2  SpO2 91% Constitutional: He is oriented to person, place, and time. He appears well-developed and well-nourished. No distress.  HENT: No nasal discharge.  Head: Normocephalic and atraumatic.  Eyes: Pupils are equal and round. Right eye exhibits no discharge. Left eye exhibits no discharge.  Neck: Normal range of motion. Neck supple. No JVD present. No thyromegaly present.  Bilateral carotid bruits. Cardiovascular: Normal rate, regular rhythm, normal heart sounds and. Exam reveals no gallop and no friction rub. No murmur heard.  Pulmonary/Chest: Effort normal and breath sounds normal. No stridor. No respiratory distress. He has no wheezes. He has no rales. He exhibits no tenderness.  Abdominal: Soft. Bowel sounds are normal. He exhibits no distension. There is no tenderness. There is no rebound and no guarding.  Musculoskeletal: Normal range of motion. He exhibits no edema and no tenderness.  Neurological: He is alert and oriented to person, place, and time. Coordination normal.  Skin: Skin is warm and dry. No rash noted. He is not diaphoretic. No erythema. No pallor.  Psychiatric: He has a normal mood and affect. His behavior is normal. Judgment and thought content normal.  vascular: Femoral pulses : +2 on  the right side and +1 on the left side . Distal pulses are not palpable.    ASSESSMENT AND PLAN  CAD/CABG: 2009 grafts patent by cath in 2012 increaseing dyspnea with exertion and tightness f/u lexiscan myovue  PVD:  ABI's around .71 claudication stable f/u Ar[place] History of left  iliac artery stent with bilateral 1 V run off Carotids: Reviewed carotids from 02/17/15 plaque no stenosis f/u due 02/2017   Jenkins Rouge

## 2015-02-23 ENCOUNTER — Encounter: Payer: Self-pay | Admitting: Cardiovascular Disease

## 2015-02-23 ENCOUNTER — Ambulatory Visit (INDEPENDENT_AMBULATORY_CARE_PROVIDER_SITE_OTHER): Payer: PPO | Admitting: Cardiovascular Disease

## 2015-02-23 VITALS — BP 132/54 | HR 64 | Ht 67.0 in | Wt 164.8 lb

## 2015-02-23 DIAGNOSIS — R0789 Other chest pain: Secondary | ICD-10-CM

## 2015-02-23 DIAGNOSIS — R002 Palpitations: Secondary | ICD-10-CM

## 2015-02-23 DIAGNOSIS — I10 Essential (primary) hypertension: Secondary | ICD-10-CM | POA: Diagnosis not present

## 2015-02-23 DIAGNOSIS — E785 Hyperlipidemia, unspecified: Secondary | ICD-10-CM | POA: Diagnosis not present

## 2015-02-23 NOTE — Patient Instructions (Addendum)
Medication Instructions:  Your physician recommends that you continue on your current medications as directed. Please refer to the Current Medication list given to you today.  Labwork: NONE  Testing/Procedures Your physician has requested that you have a lexiscan myoview. For further information please visit [email or web address]. Please follow instruction sheet, as given.  Follow-Up: Your physician wants you to follow-up in: 6 months with Dr. Nishan. You will receive a reminder letter in the mail two months in advance. If you don't receive a letter, please call our office to schedule the follow-up appointment.   If you need a refill on your cardiac medications before your next appointment, please call your pharmacy.    

## 2015-02-24 ENCOUNTER — Telehealth: Payer: Self-pay | Admitting: Cardiovascular Disease

## 2015-02-24 NOTE — Telephone Encounter (Signed)
New Message  Pt called states that the myocardial perfusion is a bit expensive for them to pay at this time. Request a call back to discuss an alternate test at this time/ Please call back to discuss.

## 2015-02-24 NOTE — Telephone Encounter (Signed)
Dr. Johnsie Cancel reviewed [facility] records from [facility], no NM stress test was found. Per Dr. Johnsie Cancel, if patient wants to postpone NM stress test and see him in 6 months. Patient given instructions and verbalized understanding.

## 2015-02-24 NOTE — Telephone Encounter (Signed)
Called patient about his concerns about having lexiscan. Patient would like to either have a exercise tolerance test or have the lexiscan Myoview done later. Patient stated right now is not a good time financially for this test. Will forward to Dr. Johnsie Cancel for recommendation.

## 2015-02-24 NOTE — Telephone Encounter (Signed)
Follow up      Wife thinks patient had a myoview stress test when he was at [facility] 1-2 yrs ago.  Can you pull up [facility] hosp records and look at them?

## 2015-02-26 ENCOUNTER — Encounter (HOSPITAL_COMMUNITY): Payer: PPO

## 2015-03-03 DIAGNOSIS — R300 Dysuria: Secondary | ICD-10-CM | POA: Diagnosis not present

## 2015-03-03 DIAGNOSIS — Z1211 Encounter for screening for malignant neoplasm of colon: Secondary | ICD-10-CM | POA: Diagnosis not present

## 2015-03-03 DIAGNOSIS — R102 Pelvic and perineal pain: Secondary | ICD-10-CM | POA: Diagnosis not present

## 2015-03-04 ENCOUNTER — Telehealth: Payer: Self-pay | Admitting: Cardiovascular Disease

## 2015-03-04 NOTE — Telephone Encounter (Signed)
New Message  Pt c/o of Chest Pain: STAT if CP now or developed within 24 hours   1. Are you having CP right now? No, but had to stop exercising on treadmill due to CP   2. Are you experiencing any other symptoms (ex. SOB, nausea, vomiting, sweating)? SOB  3. How long have you been experiencing CP? About week   4. Is your CP continuous or coming and going? Comes and goes after exertion   5. Have you taken Nitroglycerin? No  ?

## 2015-03-04 NOTE — Telephone Encounter (Signed)
Left a message for patient to call back. 

## 2015-03-05 NOTE — Telephone Encounter (Signed)
Returned call to patient, he states he is having shortness of breath and chest pressure with exertion.  Pt confirms no change in symptoms since he saw Dr Johnsie Cancel earlier this month. Dr Johnsie Cancel ordered a stress test scheduled for next week.  Pt states he is concerned about his ability to pay for the test.  Writer spoke with Dr Johnsie Cancel who then recommended pt go to the ED today, pt may need cath today.  Writer delivered Dr Kyla Balzarine recommendation to patient and his wife, patient's wife can be heard in the background verbalizing multiple concerns about the possible wait in the ED, patient's wife stated "tell her you are not that bad off."  Pt states he would have difficulty finding a ride home from the ED, states his wife cannot sit in the chairs in the ED for hours. Writer explained numerous times Dr Kyla Balzarine recommendation and patient's right to choose not to take those recommendations. Pt and his wife perseverated on their concern about the wait in the ED and difficulty with transportation.  Pt made conflicting statements about his ability to secure transportation to the ED, declined an ambulance or cab ride.  Pt states he lives 30 miles [place] the [place].  Writer explained concerns for a significant cardiac event and need for evaluation, patient's wife stated several times "pt was not that bad off."  Patient's wife explained to Probation officer she thinks patient has just been doing too much.  Patient decided he did not want to go to the ED, writer will discuss with Dr Johnsie Cancel and call pt back if needed. Georgana Curio MHA RN CCM

## 2015-03-05 NOTE — Telephone Encounter (Signed)
LMTCB

## 2015-03-08 ENCOUNTER — Inpatient Hospital Stay (HOSPITAL_COMMUNITY)
Admission: EM | Admit: 2015-03-08 | Discharge: 2015-03-09 | DRG: 287 | Disposition: A | Discharge status: Home / Self Care | Payer: PPO | Attending: Cardiology | Admitting: Cardiology

## 2015-03-08 ENCOUNTER — Telehealth: Payer: Self-pay | Admitting: Cardiovascular Disease

## 2015-03-08 ENCOUNTER — Encounter (HOSPITAL_COMMUNITY): Payer: Self-pay

## 2015-03-08 ENCOUNTER — Emergency Department (HOSPITAL_COMMUNITY): Payer: PPO

## 2015-03-08 DIAGNOSIS — I25110 Atherosclerotic heart disease of native coronary artery with unstable angina pectoris: Secondary | ICD-10-CM | POA: Diagnosis not present

## 2015-03-08 DIAGNOSIS — E785 Hyperlipidemia, unspecified: Secondary | ICD-10-CM

## 2015-03-08 DIAGNOSIS — R0600 Dyspnea, unspecified: Secondary | ICD-10-CM | POA: Diagnosis not present

## 2015-03-08 DIAGNOSIS — R0989 Other specified symptoms and signs involving the circulatory and respiratory systems: Secondary | ICD-10-CM | POA: Diagnosis present

## 2015-03-08 DIAGNOSIS — Z794 Long term (current) use of insulin: Secondary | ICD-10-CM

## 2015-03-08 DIAGNOSIS — E7800 Pure hypercholesterolemia, unspecified: Secondary | ICD-10-CM | POA: Diagnosis not present

## 2015-03-08 DIAGNOSIS — I200 Unstable angina: Secondary | ICD-10-CM

## 2015-03-08 DIAGNOSIS — Z79899 Other long term (current) drug therapy: Secondary | ICD-10-CM | POA: Diagnosis not present

## 2015-03-08 DIAGNOSIS — E1151 Type 2 diabetes mellitus with diabetic peripheral angiopathy without gangrene: Secondary | ICD-10-CM | POA: Diagnosis not present

## 2015-03-08 DIAGNOSIS — R0789 Other chest pain: Secondary | ICD-10-CM | POA: Diagnosis not present

## 2015-03-08 DIAGNOSIS — Z951 Presence of aortocoronary bypass graft: Secondary | ICD-10-CM | POA: Diagnosis not present

## 2015-03-08 DIAGNOSIS — Z981 Arthrodesis status: Secondary | ICD-10-CM | POA: Diagnosis not present

## 2015-03-08 DIAGNOSIS — K219 Gastro-esophageal reflux disease without esophagitis: Secondary | ICD-10-CM | POA: Diagnosis present

## 2015-03-08 DIAGNOSIS — E119 Type 2 diabetes mellitus without complications: Secondary | ICD-10-CM | POA: Diagnosis present

## 2015-03-08 DIAGNOSIS — I2582 Chronic total occlusion of coronary artery: Secondary | ICD-10-CM | POA: Diagnosis present

## 2015-03-08 DIAGNOSIS — H9190 Unspecified hearing loss, unspecified ear: Secondary | ICD-10-CM | POA: Diagnosis present

## 2015-03-08 DIAGNOSIS — Z7984 Long term (current) use of oral hypoglycemic drugs: Secondary | ICD-10-CM

## 2015-03-08 DIAGNOSIS — Z7982 Long term (current) use of aspirin: Secondary | ICD-10-CM | POA: Diagnosis not present

## 2015-03-08 DIAGNOSIS — R0602 Shortness of breath: Secondary | ICD-10-CM | POA: Diagnosis not present

## 2015-03-08 DIAGNOSIS — I2510 Atherosclerotic heart disease of native coronary artery without angina pectoris: Secondary | ICD-10-CM | POA: Diagnosis present

## 2015-03-08 DIAGNOSIS — I10 Essential (primary) hypertension: Secondary | ICD-10-CM

## 2015-03-08 DIAGNOSIS — Z8507 Personal history of malignant neoplasm of pancreas: Secondary | ICD-10-CM | POA: Diagnosis not present

## 2015-03-08 DIAGNOSIS — E1159 Type 2 diabetes mellitus with other circulatory complications: Secondary | ICD-10-CM | POA: Diagnosis present

## 2015-03-08 HISTORY — DX: Peripheral vascular disease, unspecified: I73.9

## 2015-03-08 LAB — CBC
HCT: 41.1 % (ref 39.0–52.0)
Hemoglobin: 14 g/dL (ref 13.0–17.0)
MCH: 32 pg (ref 26.0–34.0)
MCHC: 34.1 g/dL (ref 30.0–36.0)
MCV: 94.1 fL (ref 78.0–100.0)
Platelets: 281 10*3/uL (ref 150–400)
RBC: 4.37 MIL/uL (ref 4.22–5.81)
RDW: 13.3 % (ref 11.5–15.5)
WBC: 8.5 10*3/uL (ref 4.0–10.5)

## 2015-03-08 LAB — BASIC METABOLIC PANEL
Anion gap: 10 (ref 5–15)
BUN: 22 mg/dL — ABNORMAL HIGH (ref 6–20)
CO2: 28 mmol/L (ref 22–32)
Calcium: 9.7 mg/dL (ref 8.9–10.3)
Chloride: 102 mmol/L (ref 101–111)
Creatinine, Ser: 1.05 mg/dL (ref 0.61–1.24)
GFR calc Af Amer: >60 mL/min (ref >60)
GFR calc non Af Amer: >60 mL/min (ref >60)
Glucose, Bld: 184 mg/dL — ABNORMAL HIGH (ref 65–99)
Potassium: 4.2 mmol/L (ref 3.5–5.1)
Sodium: 140 mmol/L (ref 135–145)

## 2015-03-08 LAB — PROTIME-INR
INR: 1.07 (ref 0.00–1.49)
Prothrombin Time: 14.1 seconds (ref 11.6–15.2)

## 2015-03-08 LAB — I-STAT TROPONIN, ED: Troponin i, poc: 0.01 ng/mL (ref 0.00–0.08)

## 2015-03-08 LAB — TROPONIN I: Troponin I: <0.03 ng/mL (ref <0.031)

## 2015-03-08 MED ORDER — HEPARIN (PORCINE) IN NACL 100-0.45 UNIT/ML-% IJ SOLN
900.0000 [IU]/h | INTRAMUSCULAR | Status: DC
  Filled 2015-03-08: qty 250

## 2015-03-08 MED ORDER — HEPARIN BOLUS VIA INFUSION
4000.0000 [IU] | Freq: Once | INTRAVENOUS | Status: DC
  Filled 2015-03-08: qty 4000

## 2015-03-08 MED ORDER — SODIUM CHLORIDE 0.9 % IV SOLN: 250.0000 mL | INTRAVENOUS | Status: DC | PRN

## 2015-03-08 MED ORDER — NITROGLYCERIN 0.4 MG SL SUBL: 0.4000 mg | SUBLINGUAL_TABLET | SUBLINGUAL | Status: DC | PRN

## 2015-03-08 MED ORDER — LISINOPRIL 20 MG PO TABS
20.0000 mg | ORAL_TABLET | Freq: Every day | ORAL | Status: DC
  Administered 2015-03-09: 20 mg via ORAL
  Filled 2015-03-08: qty 1

## 2015-03-08 MED ORDER — SODIUM CHLORIDE 0.9 % IJ SOLN
3.0000 mL | Freq: Two times a day (BID) | INTRAMUSCULAR | Status: DC
  Administered 2015-03-08: 3 mL via INTRAVENOUS

## 2015-03-08 MED ORDER — ASPIRIN EC 81 MG PO TBEC
81.0000 mg | DELAYED_RELEASE_TABLET | Freq: Every day | ORAL | Status: DC
  Administered 2015-03-09: 81 mg via ORAL
  Filled 2015-03-08: qty 1

## 2015-03-08 MED ORDER — INSULIN GLARGINE 100 UNIT/ML ~~LOC~~ SOLN
10.0000 [IU] | Freq: Every day | SUBCUTANEOUS | Status: DC
  Administered 2015-03-08: 5 [IU] via SUBCUTANEOUS
  Filled 2015-03-08 (×3): qty 0.1

## 2015-03-08 MED ORDER — FENOFIBRATE 160 MG PO TABS
160.0000 mg | ORAL_TABLET | Freq: Every day | ORAL | Status: DC
Start: 2015-03-09 — End: 2015-03-09
  Administered 2015-03-09: 160 mg via ORAL
  Filled 2015-03-08: qty 1

## 2015-03-08 MED ORDER — SODIUM CHLORIDE 0.9 % WEIGHT BASED INFUSION
3.0000 mL/kg/h | INTRAVENOUS | Status: DC
  Administered 2015-03-09: 3 mL/kg/h via INTRAVENOUS

## 2015-03-08 MED ORDER — ACETAMINOPHEN 325 MG PO TABS: 650.0000 mg | ORAL_TABLET | Freq: Four times a day (QID) | ORAL | Status: DC | PRN

## 2015-03-08 MED ORDER — AMLODIPINE BESYLATE 10 MG PO TABS
10.0000 mg | ORAL_TABLET | Freq: Every day | ORAL | Status: DC
  Administered 2015-03-09: 10 mg via ORAL
  Filled 2015-03-08: qty 1

## 2015-03-08 MED ORDER — SODIUM CHLORIDE 0.9 % IJ SOLN: 3.0000 mL | Freq: Two times a day (BID) | INTRAMUSCULAR | Status: DC

## 2015-03-08 MED ORDER — FAMOTIDINE IN NACL 20-0.9 MG/50ML-% IV SOLN
20.0000 mg | INTRAVENOUS | Status: AC
  Administered 2015-03-09: 20 mg via INTRAVENOUS
  Filled 2015-03-08: qty 50

## 2015-03-08 MED ORDER — GLIPIZIDE 10 MG PO TABS: 10.0000 mg | ORAL_TABLET | Freq: Two times a day (BID) | ORAL | Status: DC

## 2015-03-08 MED ORDER — DIPHENHYDRAMINE HCL 50 MG/ML IJ SOLN
25.0000 mg | INTRAMUSCULAR | Status: AC
  Administered 2015-03-09: 25 mg via INTRAVENOUS
  Filled 2015-03-08: qty 1

## 2015-03-08 MED ORDER — ONDANSETRON HCL 4 MG/2ML IJ SOLN: 4.0000 mg | Freq: Four times a day (QID) | INTRAMUSCULAR | Status: DC | PRN

## 2015-03-08 MED ORDER — HYDROCHLOROTHIAZIDE 12.5 MG PO CAPS: 12.5000 mg | ORAL_CAPSULE | Freq: Every day | ORAL | Status: DC

## 2015-03-08 MED ORDER — SODIUM CHLORIDE 0.9 % IJ SOLN: 3.0000 mL | INTRAMUSCULAR | Status: DC | PRN

## 2015-03-08 MED ORDER — METHYLPREDNISOLONE SODIUM SUCC 125 MG IJ SOLR
125.0000 mg | INTRAMUSCULAR | Status: AC
  Administered 2015-03-09: 125 mg via INTRAVENOUS
  Filled 2015-03-08: qty 2

## 2015-03-08 MED ORDER — NITROGLYCERIN 2 % TD OINT
0.5000 [in_us] | TOPICAL_OINTMENT | Freq: Four times a day (QID) | TRANSDERMAL | Status: DC
  Administered 2015-03-08 – 2015-03-09 (×2): 0.5 [in_us] via TOPICAL
  Filled 2015-03-08: qty 1
  Filled 2015-03-08: qty 30

## 2015-03-08 MED ORDER — SODIUM CHLORIDE 0.9 % WEIGHT BASED INFUSION: 1.0000 mL/kg/h | INTRAVENOUS | Status: DC

## 2015-03-08 MED ORDER — LISINOPRIL-HYDROCHLOROTHIAZIDE 20-12.5 MG PO TABS: 1.0000 | ORAL_TABLET | Freq: Every day | ORAL | Status: DC

## 2015-03-08 NOTE — Telephone Encounter (Signed)
New message      Pt c/o Shortness Of Breath: STAT if SOB developed within the last 24 hours or pt is noticeably SOB on the phone  1. Are you currently SOB (can you hear that pt is SOB on the phone)? yes 2. How long have you been experiencing SOB? Worse this weekend 3. Are you SOB when sitting or when up moving around?  Moving around  4. Are you currently experiencing any other symptoms? Chest pressure

## 2015-03-08 NOTE — ED Notes (Signed)
Pt c/o 3/10 chest pain; ambulatory to restroom with steady gait, increased shortness of breath noted on exertion.

## 2015-03-08 NOTE — ED Notes (Signed)
Patient here with chest pain and exertional shortness of breath since Thursday. Patient reports that he was sent from dr. Mariana Arn office for admission.

## 2015-03-08 NOTE — H&P (Signed)
CARDIOLOGY H&P NOTE.  NAME:  Ricardo Le   MRN: [id number] DOB:  09/20/1940   ADMIT DATE: 03/08/2015  Reason for Consult: Exertional Chest Pain  Requesting Physician: Dr. Jeneen Rinks - EDP  Primary Cardiologist: Johnsie Cancel  HPI: This is a 75 y.o. male with a past medical history significant for CAD/CABG and PVD He has known history of coronary artery disease status post coronary artery bypass grafting in 2009 with subsequent cardiac catheterization in May 2012 revealing native multivessel disease with 4 of 4 patent grafts.  He has a h/o PAD - s/p L Common Iliac Stent in March 2016.  In the usual state of health until earlier this month when he was seen by Dr. Johnsie Cancel in clinic. He was noticing progressively worsening exertional dyspnea. He usually walks about a mile to mile and a half a treadmill everyday, but starting beginning of this year he has been unable to do more than 200 feet. This has gone progressively worse over the course of the past weekend. When he saw Dr. Nolon Lennert on the for 10th of January, the plan was for them to do a Myoview stress test. That was planned for this week.  Unfortunately, he contacted Dr. Weber Cooks office earlier today with complaints of worsening exertional chest pressure/tightness and dyspnea over the past weekend where he cannot walk more than 100 feet without getting short of breath. Has some discomfort at rest. He is noticing that he can feel his heart beating when he is having the symptoms. Is not an irregular palpitation or irregular beat. He is not had any heart patient is a PND, orthopnea or edema. No rapid irregular heartbeats or palpitations. No syncope/near syncope or TIA/neurosis fugax.  Based on his worsening symptoms, he contacted the office and was told to come to emergency room. The symptoms are concerning for unstable angina.  Cardiology was notified of the patient's arrival.  Past Medical History  Diagnosis Date  . Palpitations   . CAD  (coronary artery disease)     a. 04/2007 s/p CABG x 4  - LIMA->LAD, VG->OM1->Om2, VG->PDA;  b. 12/2008 & 06/2010 Caths - Native multivessel dzs with 4/4 patent grafts.  . Pure hypercholesterolemia     statin intolerance  . DDD (degenerative disc disease)     several surgeries  . Carotid bruit     a. 07/2010 U/S- 0-39% bilat ICA stenosis  . Nephrolithiasis   . HNP (herniated nucleus pulposus), lumbar     a. L2-3, s/p laminotomy, microdiskectomy 02/2009  . Pancreatic mass     a. 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 Chest pain     Noncardiac probably related to reflux  . Unspecified essential hypertension   . Pneumonia     hx of  pna  . Type II or unspecified type diabetes mellitus without mention of complication, not stated as uncontrolled     insulin dependent  . GERD (gastroesophageal reflux disease)   . Cancer [facility])     pancreatic  March 2011  . HOH (hard of hearing)     uses amplifiers   . PAD (peripheral artery disease) ([employer]) 04/2014    s/p L Common Iliac Stent   Past Surgical History  Procedure Laterality Date  . Re-exploratin of laminectomy  05/2008    (RT L2-3) WITH REDO MICRODISKECTOMY  . Cyst removed      FROM SPINE  . Nephrolithotomy      PERCUTANEOUS  . Coronary artery bypass graft  X 4 2009 (LIMA to LAD, SVG to first cicumflex marginal branch with sequential SVG to second cicumflex marginal branch  and saphenous vein to posterior descending coronary artery with endoscopic,vein harvest rt. lower exttremity by Dr.Owen on March 31,2009  . Cystourethroscopy      right retrograde pyelogram,manipulate stone in the renal pelvis, rt. double-j catheter.  . Pars plana vitrectomy      lt. eye,retinal photocoagulation lt. eye, membrane peel lt. eye.  . Cholecystectomy    . Appendectomy    . Arthroscopy right knee    . Back surgery    . Knee surgery      rt knee  . Abdominal aortagram N/A 05/13/2014    Procedure: ABDOMINAL [name];  Surgeon:  Wellington Hampshire, MD;  Location: [facility] C[facility] LAB;  Service: Cardiovascular;  Laterality: N/A;  . Percutaneous stent intervention Left 05/13/2014    Procedure: PERCUTANEOUS STENT INTERVENTION;  Surgeon: Wellington Hampshire, MD;  Location: [place] CATH LAB;  Service: Cardiovascular;  Laterality: Left;  COMMON ILLIAC  . Eye surgery    . Anterior cervical decomp/discectomy fusion N/A 07/28/2014    Procedure: ANTERIOR CERVICAL DECOMPRESSION/DISCECTOMY FUSION CERVICAL FOUR-FIVE CERVICAL FIVE-SIX ;  Surgeon: Earnie Larsson, MD;  Location: [place] NEURO ORS;  Service: Neurosurgery;  Laterality: N/A;    FAMHx: Family History  Problem Relation Age of Onset  . Heart attack Father     died of MI @ 6  . Diabetes Brother     type 2  . Other      no fh of colon cancer  . Other Mother     died @ 77  . Coronary artery disease Brother     s/p CABG.  Alive @ 47  . Coronary artery disease Sister     alive    SOCHx:  reports that he has never smoked. He has never used smokeless tobacco. He reports that he does not drink alcohol or use illicit drugs.  ALLERGIES: Allergies  Allergen Reactions  . Rosuvastatin Calcium Other (See Comments)    Myalgias  . Iohexol Hives    pt ok with premedication (Benadryl 50mg  PO), Onset Date: [id number]   . Statins Other (See Comments)    Myalgias     ROS:  A comprehensive review of systems was performed Review of Systems  Constitutional: Negative for malaise/fatigue.  HENT: Negative for ear discharge and nosebleeds.        He does get headaches with nitroglycerin  Eyes: Negative for blurred vision.  Respiratory: Positive for shortness of breath. Negative for cough and sputum production.   Cardiovascular: Negative for orthopnea, claudication and leg swelling.  Gastrointestinal: Negative for blood in stool and melena.  Genitourinary: Negative for hematuria.  Musculoskeletal: Negative for joint pain and falls.  Neurological: Negative for dizziness, loss of consciousness, weakness  and headaches.  Endo/Heme/Allergies: Does not bruise/bleed easily.  Psychiatric/Behavioral: Negative for depression. The patient does not have insomnia.   All other systems reviewed and are negative.     HOME MEDICATIONS: Prior to Admission medications   Medication Sig Start Date End Date Taking? Authorizing Provider  acetaminophen (TYLENOL) 325 MG tablet Take 650 mg by mouth every 6 (six) hours as needed (pain).   Yes Historical Provider, MD  amLODipine (NORVASC) 10 MG tablet Take 10 mg by mouth daily.   Yes Historical Provider, MD  aspirin EC 81 MG tablet Take 81 mg by mouth at bedtime.   Yes Historical Provider, MD  fenofibrate (TRICOR) 145 MG tablet Take  145 mg by mouth daily.   Yes Historical Provider, MD  glipiZIDE (GLUCOTROL) 10 MG tablet Take 10 mg by mouth 2 (two) times daily.    Yes Historical Provider, MD  insulin glargine (LANTUS SOLOSTAR) 100 UNIT/ML injection Inject 10 Units into the skin at bedtime.    Yes Historical Provider, MD  lisinopril-hydrochlorothiazide (PRINZIDE,ZESTORETIC) 20-12.5 MG per tablet Take 1 tablet by mouth daily.     Yes Historical Provider, MD  gabapentin (NEURONTIN) 300 MG capsule Take 1 capsule (300 mg total) by mouth 2 (two) times daily. Patient not taking: Reported on 03/08/2015 06/30/14   Wellington Hampshire, MD    HOSPITAL MEDICATIONS: I have reviewed the patient's current medications.  VITALS: Blood pressure 181/62, pulse 59, temperature 97.3 F (36.3 C), temperature source Oral, resp. rate 16, SpO2 96 %.  PHYSICAL EXAM: Physical Exam  Constitutional: He is oriented to person, place, and time. He appears well-developed and well-nourished.  HENT:  Head: Normocephalic and atraumatic.  Nose: Nose normal.  Mouth/Throat: Oropharynx is clear and moist. No oropharyngeal exudate.  Eyes: Conjunctivae and EOM are normal. Pupils are equal, round, and reactive to light. No scleral icterus.  Neck: Normal range of motion. Neck supple. No JVD present. No  tracheal deviation present. No thyromegaly present.  Bilateral carotid bruit  Cardiovascular: Normal rate, regular rhythm, normal heart sounds and intact distal pulses.  Exam reveals no gallop and no friction rub.   No murmur heard. Pulmonary/Chest: Effort normal and breath sounds normal. No respiratory distress. He has no wheezes. He has no rales. He exhibits no tenderness.  Abdominal: Soft. Bowel sounds are normal. He exhibits no distension. There is no tenderness. There is no rebound and no guarding.  Genitourinary:  deferred  Musculoskeletal: Normal range of motion. He exhibits no edema or tenderness.  Lymphadenopathy:    He has no cervical adenopathy.  Neurological: He is alert and oriented to person, place, and time. He has normal reflexes. No cranial nerve deficit.  Skin: Skin is warm and dry. No erythema.  Psychiatric: He has a normal mood and affect. His behavior is normal. Judgment and thought content normal.    LABS: Results for orders placed or performed during the hospital encounter of 03/08/15 (from the past 24 hour(s))  Basic metabolic panel     Status: Abnormal   Collection Time: 03/08/15 11:39 AM  Result Value Ref Range   Sodium 140 135 - 145 mmol/L   Potassium 4.2 3.5 - 5.1 mmol/L   Chloride 102 101 - 111 mmol/L   CO2 28 22 - 32 mmol/L   Glucose, Bld 184 (H) 65 - 99 mg/dL   BUN 22 (H) 6 - 20 mg/dL   Creatinine, Ser 1.05 0.61 - 1.24 mg/dL   Calcium 9.7 8.9 - 10.3 mg/dL   GFR calc non Af Amer >60 >60 mL/min   GFR calc Af Amer >60 >60 mL/min   Anion gap 10 5 - 15  CBC     Status: None   Collection Time: 03/08/15 11:39 AM  Result Value Ref Range   WBC 8.5 4.0 - 10.5 K/uL   RBC 4.37 4.22 - 5.81 MIL/uL   Hemoglobin 14.0 13.0 - 17.0 g/dL   HCT 41.1 39.0 - 52.0 %   MCV 94.1 78.0 - 100.0 fL   MCH 32.0 26.0 - 34.0 pg   MCHC 34.1 30.0 - 36.0 g/dL   RDW 13.3 11.5 - 15.5 %   Platelets 281 150 - 400 K/uL  I-stat troponin,  ED (not at [facility])     Status: None    Collection Time: 03/08/15 11:39 AM  Result Value Ref Range   Troponin i, poc 0.01 0.00 - 0.08 ng/mL   Comment 3          Protime-INR - (order if Patient is taking Coumadin / Warfarin)     Status: None   Collection Time: 03/08/15 11:39 AM  Result Value Ref Range   Prothrombin Time 14.1 11.6 - 15.2 seconds   INR 1.07 0.00 - 1.49    IMAGING: Dg Chest 2 View  03/08/2015  CLINICAL DATA:  Chest pain, shortness of breath with exertion for 4-5 days. EXAM: CHEST  2 VIEW COMPARISON:  05/20/2014 FINDINGS: Slight elevation of the right hemidiaphragm. Right base atelectasis. Left lung is clear. Prior CABG. Heart is normal size. No acute bony abnormality. IMPRESSION: Elevated right hemidiaphragm with right base atelectasis. Electronically Signed   By: Rolm Baptise M.D.   On: 03/08/2015 12:29    EKG: Heart rate 62, NSR with eye RBBB, LAD (negative except) inferior Q waves suggestive of inferior MI, age undetermined, cannot exclude septal MI, age undetermined, cannot exclude lateral MI, age undetermined. Otherwise stable EKG with no acute changes.  IMPRESSION: Principal Problem:   Unstable angina (HCC) Active Problems:   Atherosclerotic heart disease of native coronary artery with unstable angina pectoris (HCC)   S/P CABG x 4   Type 2 diabetes mellitus with vascular disease ([place])   Essential hypertension   Hyperlipidemia   Carotid bruit   Elevated lipids   He is presenting with signs and symptoms are very concerning for progressive angina/unstable angina. At this point, I don't think that a stress test will differentiate my concern for this being acute coronary syndrome related.  Plan:  Admit to telemetry bed. Will start heparin and nitroglycerin paste.  We'll cycle cardiac enzymes, however I don't think that he is to have significant elevations.  Based on his presentation, I think the best option is to proceed with heart catheterization.  He is not on beta blocker due to bradycardia. Not  on statin due to history of intolerance/allergy but is on fenofibrate. -- Check fasting lipid panel Continue his ACE inhibitor. - Blood pressures controlled.  He does have diabetes with p.m. Lantus. Will dose accordingly and consider sliding scale was received his glycemic control is.  He is not on metformin.  Stable carotid bruits.  Cardiac cath position consent: .Performing MD:  Shelva Majestic, M.D.  Procedure:  Cardiac Catheterization with Native Coronary and Graft Angiography with Possible per His Coronary Intervention.  The procedure with Risks/Benefits/Alternatives and Indications was reviewed with the patient and his wife.  All questions were answered.    Risks / Complications include, but not limited to: Death, MI, CVA/TIA, VF/VT (with defibrillation), Bradycardia (need for temporary pacer placement), contrast induced nephropathy, bleeding / bruising / hematoma / pseudoaneurysm, vascular or coronary injury (with possible emergent CT or Vascular Surgery), adverse medication reactions, infection.  Additional risks involving the use of radiation with the possibility of radiation burns and cancer were explained in detail. Additional risk for PCI EIA on grafts was discussed - increased risk of stroke and heart attack The patient (and family) voice understanding and agree to proceed.     Time Spent Directly with Patient: 30 minutes  Saif Peter, Leonie Green, M.D., M.S. Interventional Cardiologist   Pager # [phone] Phone # [phone] [address] [place], [place] [address]

## 2015-03-08 NOTE — Telephone Encounter (Signed)
I spoke with the pt and he complains of chest pressure and tightness at this time that has progressively worsened over the weekend.  The pt said that he cannot walk 100 ft without getting SOB.  The pt is scheduled for myoview on 03/11/15 due to symptoms.  In reviewing the pt's chart he has called multiple times with symptoms and they have continued to worsen.  Due to the pt currently having symptoms I have advised him to proceed to the [facility] ER for evaluation.  Pt agreed with plan. Trish notified.

## 2015-03-08 NOTE — ED Provider Notes (Signed)
Patient seen and evaluated. Discussed with Dr.Proulx.  She with history of CABG 2009. Stent in 2012. Over the last several weeks has redeveloped shortness of breath with exertion. As recently as the first week in January was walking a mile per day. States he walks only 25 200 feet and gets tightness in his chest and shortness of breath or limits his exertion. Falls with Dr. Johnsie Cancel.  Is asymptomatic here at rest. EKG shows no acute changes with ST elevations. First troponin is normal. Some basilar crackles. Simply lie some chest x-ray without infiltrate. No radiographic clinical signs of CHF. Afebrile not hypoxemic. Agree with discussion with cardiology regarding admission for further studies.  Tanna Furry, MD 03/08/15 1726

## 2015-03-08 NOTE — ED Provider Notes (Signed)
CSN: [id number]     Arrival date & time 03/08/15  1104 History   First MD Initiated Contact with Patient 03/08/15 1643     Chief Complaint  Patient presents with  . Chest Pain     (Consider location/radiation/quality/duration/timing/severity/associated sxs/prior Treatment) HPI   Ricardo Le is a 75 y.o. patient who is here today for a follow up CAD/CABG and PVD He has known history of coronary artery disease status post coronary artery bypass grafting in 2009 with subsequent cardiac catheterization in May 2012 revealing native multivessel disease with 4 of 4 patent grafts.   Over the past week he has been having progressively worsening DOE and chest pressure on exertion which he doesn't typically have with ambulation.  He is currently chest pain free.  He has been in communication with his cardiologist and was told to report to the ER.  Past Medical History  Diagnosis Date  . Palpitations   . CAD (coronary artery disease)     a. 04/2007 s/p CABG x 4  - LIMA->LAD, VG->OM1->Om2, VG->PDA;  b. 12/2008 & 06/2010 Caths - Native multivessel dzs with 4/4 patent grafts.  . Pure hypercholesterolemia     statin intolerance  . DDD (degenerative disc disease)     several surgeries  . Carotid bruit     a. 07/2010 U/S- 0-39% bilat ICA stenosis  . Nephrolithiasis   . HNP (herniated nucleus pulposus), lumbar     a. L2-3, s/p laminotomy, microdiskectomy 02/2009  . Pancreatic mass     a. 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 Chest pain     Noncardiac probably related to reflux  . Unspecified essential hypertension   . Pneumonia     hx of  pna  . Type II or unspecified type diabetes mellitus without mention of complication, not stated as uncontrolled     insulin dependent  . GERD (gastroesophageal reflux disease)   . Cancer [facility])     pancreatic  March 2011  . HOH (hard of hearing)     uses amplifiers   . PAD (peripheral artery disease) ([place]) 04/2014    s/p L Common Iliac  Stent   Past Surgical History  Procedure Laterality Date  . Re-exploratin of laminectomy  05/2008    (RT L2-3) WITH REDO MICRODISKECTOMY  . Cyst removed      FROM SPINE  . Nephrolithotomy      PERCUTANEOUS  . Coronary artery bypass graft      X 4 2009 (LIMA to LAD, SVG to first cicumflex marginal branch with sequential SVG to second cicumflex marginal branch  and saphenous vein to posterior descending coronary artery with endoscopic,vein harvest rt. lower exttremity by Dr.Owen on March 31,2009  . Cystourethroscopy      right retrograde pyelogram,manipulate stone in the renal pelvis, rt. double-j catheter.  . Pars plana vitrectomy      lt. eye,retinal photocoagulation lt. eye, membrane peel lt. eye.  . Cholecystectomy    . Appendectomy    . Arthroscopy right knee    . Back surgery    . Knee surgery      rt knee  . Abdominal aortagram N/A 05/13/2014    Procedure: ABDOMINAL [name];  Surgeon: Wellington Hampshire, MD;  Location: [facility] CATH LAB;  Service: Cardiovascular;  Laterality: N/A;  . Percutaneous stent intervention Left 05/13/2014    Procedure: PERCUTANEOUS STENT INTERVENTION;  Surgeon: Wellington Hampshire, MD;  Location: [place] CATH LAB;  Service: Cardiovascular;  Laterality: Left;  COMMON ILLIAC  . Eye surgery    . Anterior cervical decomp/discectomy fusion N/A 07/28/2014    Procedure: ANTERIOR CERVICAL DECOMPRESSION/DISCECTOMY FUSION CERVICAL FOUR-FIVE CERVICAL FIVE-SIX ;  Surgeon: Earnie Larsson, MD;  Location: [place] NEURO ORS;  Service: Neurosurgery;  Laterality: N/A;   Family History  Problem Relation Age of Onset  . Heart attack Father     died of MI @ 45  . Diabetes Brother     type 2  . Other      no fh of colon cancer  . Other Mother     died @ 80  . Coronary artery disease Brother     s/p CABG.  Alive @ 54  . Coronary artery disease Sister     alive   Social History  Substance Use Topics  . Smoking status: Never Smoker   . Smokeless tobacco: Never Used  . Alcohol Use: No     Review of Systems  Constitutional: Negative for fever and chills.  Eyes: Negative for redness.  Respiratory: Positive for shortness of breath. Negative for cough.   Cardiovascular: Positive for chest pain.  Gastrointestinal: Negative for nausea, vomiting, abdominal pain and diarrhea.  Genitourinary: Negative for dysuria.  Skin: Negative for rash.  Neurological: Negative for headaches.  All other systems reviewed and are negative.     Allergies  Rosuvastatin calcium; Iohexol; and Statins  Home Medications   Prior to Admission medications   Medication Sig Start Date End Date Taking? Authorizing Provider  acetaminophen (TYLENOL) 325 MG tablet Take 650 mg by mouth every 6 (six) hours as needed (pain).   Yes Historical Provider, MD  amLODipine (NORVASC) 10 MG tablet Take 10 mg by mouth daily.   Yes Historical Provider, MD  aspirin EC 81 MG tablet Take 81 mg by mouth at bedtime.   Yes Historical Provider, MD  fenofibrate (TRICOR) 145 MG tablet Take 145 mg by mouth daily.   Yes Historical Provider, MD  glipiZIDE (GLUCOTROL) 10 MG tablet Take 10 mg by mouth 2 (two) times daily.    Yes Historical Provider, MD  insulin glargine (LANTUS SOLOSTAR) 100 UNIT/ML injection Inject 10 Units into the skin at bedtime.    Yes Historical Provider, MD  lisinopril-hydrochlorothiazide (PRINZIDE,ZESTORETIC) 20-12.5 MG per tablet Take 1 tablet by mouth daily.     Yes Historical Provider, MD  gabapentin (NEURONTIN) 300 MG capsule Take 1 capsule (300 mg total) by mouth 2 (two) times daily. Patient not taking: Reported on 03/08/2015 06/30/14   Wellington Hampshire, MD   BP 156/56 mmHg  Pulse 55  Temp(Src) 97.7 F (36.5 C) (Oral)  Resp 22  Ht 5\' 7"  (1.702 m)  Wt 73.029 kg  BMI 25.21 kg/m2  SpO2 95% Physical Exam  Constitutional: He is oriented to person, place, and time. No distress.  HENT:  Head: Normocephalic and atraumatic.  Eyes: EOM are normal. Pupils are equal, round, and reactive to light.   Neck: Normal range of motion. Neck supple.  Cardiovascular: Normal rate.   Pulmonary/Chest: Effort normal. No respiratory distress.  Abdominal: Soft. There is no tenderness.  Musculoskeletal: Normal range of motion.  Neurological: He is alert and oriented to person, place, and time.  Skin: No rash noted. He is not diaphoretic.  Psychiatric: He has a normal mood and affect.    ED Course  Procedures (including critical care time) Labs Review Labs Reviewed  BASIC METABOLIC PANEL - Abnormal; Notable for the following:    Glucose, Bld 184 (*)  BUN 22 (*)    All other components within normal limits  MRSA PCR SCREENING  CBC  PROTIME-INR  TROPONIN I  COMPREHENSIVE METABOLIC PANEL  TROPONIN I  TROPONIN I  HEMOGLOBIN A1C  CBC  PROTIME-INR  HEPARIN LEVEL (UNFRACTIONATED)  LIPID PANEL  I-STAT TROPOININ, ED    Imaging Review Dg Chest 2 View  03/08/2015  CLINICAL DATA:  Chest pain, shortness of breath with exertion for 4-5 days. EXAM: CHEST  2 VIEW COMPARISON:  05/20/2014 FINDINGS: Slight elevation of the right hemidiaphragm. Right base atelectasis. Left lung is clear. Prior CABG. Heart is normal size. No acute bony abnormality. IMPRESSION: Elevated right hemidiaphragm with right base atelectasis. Electronically Signed   By: Rolm Baptise M.D.   On: 03/08/2015 12:29   I have personally reviewed and evaluated these images and lab results as part of my medical decision-making.   EKG Interpretation   Date/Time:  Monday March 08 2015 11:13:35 EST Ventricular Rate:  62 PR Interval:  168 QRS Duration: 96 QT Interval:  400 QTC Calculation: 406 R Axis:   -87 Text Interpretation:  Normal sinus rhythm Left axis deviation Incomplete  right bundle branch block Septal infarct , age undetermined Possible  Lateral infarct , age undetermined Confirmed by Jeneen Rinks  MD, [place] ([id number]) on  03/08/2015 5:06:10 PM      MDM   Final diagnoses:  1.  Dyspnea on exertion 2.  Chest pressure     Ricardo Le is a 75 y.o. patient who is here today for a follow up CAD/CABG and PVDpresenting with DOE and CP on exertion.  Exam as above. ekg unremarkable. Cbc/bmp/first trop unremarkable.  cxr unremarkable.  Patient not tachy, not hypoxic, doubt pe.  Doubt dissection given description of pain sounds more like acs  Concern for unstable angina.  Will consult cards.  Cards has seen patient and will admit for further eval.  No acute events in the ED    Jarome Matin, MD 03/09/15 [id number]  Jarome Matin, MD 03/09/15 0045  Tanna Furry, MD 03/17/15 [phone]

## 2015-03-08 NOTE — Progress Notes (Signed)
Patient informed staff of previous history of bleeding behind the eyes while on blood thinners. Patient was told by his pcp to avoid blood thinners. Dr. Philbert Riser was informed and has decided hold heparin gtt at this time, will continue to assess.

## 2015-03-08 NOTE — Progress Notes (Signed)
ANTICOAGULATION CONSULT NOTE - Initial Consult  Pharmacy Consult for heparin Indication: chest pain/ACS  Allergies  Allergen Reactions  . Rosuvastatin Calcium Other (See Comments)    Myalgias  . Iohexol Hives    pt ok with premedication (Benadryl 50mg  PO), Onset Date: [id number]   . Statins Other (See Comments)    Myalgias     Patient Measurements: Height: 5\' 7"  (170.2 cm) Weight: 161 lb (73.029 kg) IBW/kg (Calculated) : 66.1 Heparin Dosing Weight: 73kg  Vital Signs: Temp: 97.7 F (36.5 C) (01/23 1956) Temp Source: Oral (01/23 1956) BP: 156/56 mmHg (01/23 1956) Pulse Rate: 55 (01/23 1956)  Labs:  Recent Labs  03/08/15 1139  HGB 14.0  HCT 41.1  PLT 281  LABPROT 14.1  INR 1.07  CREATININE 1.05    Estimated Creatinine Clearance: 58.6 mL/min (by C-G formula based on Cr of 1.05).   Medical History: Past Medical History  Diagnosis Date  . Palpitations   . CAD (coronary artery disease)     a. 04/2007 s/p CABG x 4  - LIMA->LAD, VG->OM1->Om2, VG->PDA;  b. 12/2008 & 06/2010 Caths - Native multivessel dzs with 4/4 patent grafts.  . Pure hypercholesterolemia     statin intolerance  . DDD (degenerative disc disease)     several surgeries  . Carotid bruit     a. 07/2010 U/S- 0-39% bilat ICA stenosis  . Nephrolithiasis   . HNP (herniated nucleus pulposus), lumbar     a. L2-3, s/p laminotomy, microdiskectomy 02/2009  . Pancreatic mass     a. 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 Chest pain     Noncardiac probably related to reflux  . Unspecified essential hypertension   . Pneumonia     hx of  pna  . Type II or unspecified type diabetes mellitus without mention of complication, not stated as uncontrolled     insulin dependent  . GERD (gastroesophageal reflux disease)   . Cancer [facility])     pancreatic  March 2011  . HOH (hard of hearing)     uses amplifiers   . PAD (peripheral artery disease) ([place]) 04/2014    s/p L Common Iliac Stent     Medications:  Infusions:  . [START ON 03/09/2015] sodium chloride     Followed by  . [START ON 03/09/2015] sodium chloride    . heparin      Assessment: 77 yom presented to the hospital with CP. To start IV heparin. Baseline CBC is WNL and he is not on anticoagulation PTA.   Goal of Therapy:  Heparin level 0.3-0.7 units/ml Monitor platelets by anticoagulation protocol: Yes   Plan:  - Heparin bolus 4000 units IV x 1 - Heparin gtt 900 units/hr - Check an 8 hour heparin level - Daily heparin level and CBC  Archibald Marchetta, Rande Lawman 03/08/2015,8:09 PM

## 2015-03-08 NOTE — ED Notes (Signed)
Ordered Heart Healthy Diet  

## 2015-03-09 ENCOUNTER — Encounter: Payer: Self-pay | Admitting: Cardiovascular Disease

## 2015-03-09 ENCOUNTER — Telehealth (HOSPITAL_COMMUNITY): Payer: Self-pay | Admitting: *Deleted

## 2015-03-09 ENCOUNTER — Encounter (HOSPITAL_COMMUNITY): Payer: Self-pay | Admitting: Cardiovascular Disease

## 2015-03-09 ENCOUNTER — Encounter (HOSPITAL_COMMUNITY)
Admission: EM | Disposition: A | Discharge status: Home / Self Care | Payer: Self-pay | Source: Home / Self Care | Attending: Cardiovascular Disease

## 2015-03-09 DIAGNOSIS — I2582 Chronic total occlusion of coronary artery: Secondary | ICD-10-CM | POA: Diagnosis not present

## 2015-03-09 DIAGNOSIS — I10 Essential (primary) hypertension: Secondary | ICD-10-CM | POA: Diagnosis not present

## 2015-03-09 DIAGNOSIS — E1151 Type 2 diabetes mellitus with diabetic peripheral angiopathy without gangrene: Secondary | ICD-10-CM | POA: Diagnosis not present

## 2015-03-09 DIAGNOSIS — I25110 Atherosclerotic heart disease of native coronary artery with unstable angina pectoris: Secondary | ICD-10-CM | POA: Diagnosis not present

## 2015-03-09 HISTORY — PX: CARDIAC CATHETERIZATION: SHX172

## 2015-03-09 LAB — CBC
HCT: 37.9 % — ABNORMAL LOW (ref 39.0–52.0)
Hemoglobin: 12.8 g/dL — ABNORMAL LOW (ref 13.0–17.0)
MCH: 31.8 pg (ref 26.0–34.0)
MCHC: 33.8 g/dL (ref 30.0–36.0)
MCV: 94.3 fL (ref 78.0–100.0)
Platelets: 286 10*3/uL (ref 150–400)
RBC: 4.02 MIL/uL — ABNORMAL LOW (ref 4.22–5.81)
RDW: 13.4 % (ref 11.5–15.5)
WBC: 8.1 10*3/uL (ref 4.0–10.5)

## 2015-03-09 LAB — LIPID PANEL
Cholesterol: 164 mg/dL (ref 0–200)
HDL: 46 mg/dL (ref >40)
LDL Cholesterol: 98 mg/dL (ref 0–99)
Total CHOL/HDL Ratio: 3.6 RATIO
Triglycerides: 101 mg/dL (ref <150)
VLDL: 20 mg/dL (ref 0–40)

## 2015-03-09 LAB — GLUCOSE, CAPILLARY
Glucose-Capillary: 136 mg/dL — ABNORMAL HIGH (ref 65–99)
Glucose-Capillary: 112 mg/dL — ABNORMAL HIGH (ref 65–99)
Glucose-Capillary: 226 mg/dL — ABNORMAL HIGH (ref 65–99)

## 2015-03-09 LAB — PROTIME-INR
INR: 1.12 (ref 0.00–1.49)
Prothrombin Time: 14.6 seconds (ref 11.6–15.2)

## 2015-03-09 LAB — COMPREHENSIVE METABOLIC PANEL
ALT: 16 U/L — ABNORMAL LOW (ref 17–63)
AST: 19 U/L (ref 15–41)
Albumin: 3.6 g/dL (ref 3.5–5.0)
Alkaline Phosphatase: 44 U/L (ref 38–126)
Anion gap: 10 (ref 5–15)
BUN: 19 mg/dL (ref 6–20)
CO2: 28 mmol/L (ref 22–32)
Calcium: 9.5 mg/dL (ref 8.9–10.3)
Chloride: 104 mmol/L (ref 101–111)
Creatinine, Ser: 1.09 mg/dL (ref 0.61–1.24)
GFR calc Af Amer: >60 mL/min (ref >60)
GFR calc non Af Amer: >60 mL/min (ref >60)
Glucose, Bld: 227 mg/dL — ABNORMAL HIGH (ref 65–99)
Potassium: 4.2 mmol/L (ref 3.5–5.1)
Sodium: 142 mmol/L (ref 135–145)
Total Bilirubin: 1 mg/dL (ref 0.3–1.2)
Total Protein: 6.7 g/dL (ref 6.5–8.1)

## 2015-03-09 LAB — TROPONIN I: Troponin I: <0.03 ng/mL (ref <0.031)

## 2015-03-09 LAB — HEPARIN LEVEL (UNFRACTIONATED): Heparin Unfractionated: <0.1 IU/mL — ABNORMAL LOW (ref 0.30–0.70)

## 2015-03-09 LAB — D-DIMER, QUANTITATIVE: D-Dimer, Quant: 0.43 ug/mL-FEU (ref 0.00–0.50)

## 2015-03-09 LAB — POCT ACTIVATED CLOTTING TIME: Activated Clotting Time: 106 seconds

## 2015-03-09 LAB — MRSA PCR SCREENING: MRSA by PCR: POSITIVE — AB

## 2015-03-09 SURGERY — LEFT HEART CATH AND CORS/GRAFTS ANGIOGRAPHY
Anesthesia: LOCAL

## 2015-03-09 MED ORDER — FENTANYL CITRATE (PF) 100 MCG/2ML IJ SOLN
INTRAMUSCULAR | Status: DC | PRN
  Administered 2015-03-09: 25 ug via INTRAVENOUS

## 2015-03-09 MED ORDER — ISOSORBIDE MONONITRATE ER 60 MG PO TB24: 60.0000 mg | ORAL_TABLET | Freq: Every day | ORAL | Status: DC

## 2015-03-09 MED ORDER — SODIUM CHLORIDE 0.9 % IV SOLN: 250.0000 mL | INTRAVENOUS | Status: DC | PRN

## 2015-03-09 MED ORDER — ONDANSETRON HCL 4 MG/2ML IJ SOLN: 4.0000 mg | Freq: Four times a day (QID) | INTRAMUSCULAR | Status: DC | PRN

## 2015-03-09 MED ORDER — FENTANYL CITRATE (PF) 100 MCG/2ML IJ SOLN
INTRAMUSCULAR | Status: AC
  Filled 2015-03-09: qty 2

## 2015-03-09 MED ORDER — LIDOCAINE HCL (PF) 1 % IJ SOLN
INTRAMUSCULAR | Status: DC | PRN
  Administered 2015-03-09: 5 mL via INTRADERMAL

## 2015-03-09 MED ORDER — ASPIRIN EC 81 MG PO TBEC: 81.0000 mg | DELAYED_RELEASE_TABLET | Freq: Every day | ORAL | Status: DC

## 2015-03-09 MED ORDER — SODIUM CHLORIDE 0.9% FLUSH: 3.0000 mL | Freq: Two times a day (BID) | INTRAVENOUS | Status: DC

## 2015-03-09 MED ORDER — CHLORHEXIDINE GLUCONATE CLOTH 2 % EX PADS
6.0000 | MEDICATED_PAD | Freq: Every day | CUTANEOUS | Status: DC
  Administered 2015-03-09: 6 via TOPICAL

## 2015-03-09 MED ORDER — MIDAZOLAM HCL 2 MG/2ML IJ SOLN
INTRAMUSCULAR | Status: DC | PRN
  Administered 2015-03-09: 2 mg via INTRAVENOUS

## 2015-03-09 MED ORDER — HEPARIN (PORCINE) IN NACL 2-0.9 UNIT/ML-% IJ SOLN
INTRAMUSCULAR | Status: DC | PRN
  Administered 2015-03-09: 13:00:00

## 2015-03-09 MED ORDER — SODIUM CHLORIDE 0.9 % IV SOLN: INTRAVENOUS | Status: AC

## 2015-03-09 MED ORDER — SODIUM CHLORIDE 0.9% FLUSH: 3.0000 mL | INTRAVENOUS | Status: DC | PRN

## 2015-03-09 MED ORDER — LIDOCAINE HCL (PF) 1 % IJ SOLN
INTRAMUSCULAR | Status: AC
  Filled 2015-03-09: qty 30

## 2015-03-09 MED ORDER — MIDAZOLAM HCL 2 MG/2ML IJ SOLN
INTRAMUSCULAR | Status: AC
  Filled 2015-03-09: qty 2

## 2015-03-09 MED ORDER — HEPARIN (PORCINE) IN NACL 2-0.9 UNIT/ML-% IJ SOLN
INTRAMUSCULAR | Status: AC
  Filled 2015-03-09: qty 1500

## 2015-03-09 MED ORDER — IOHEXOL 350 MG/ML SOLN
INTRAVENOUS | Status: DC | PRN
  Administered 2015-03-09: 80 mL via INTRA_ARTERIAL

## 2015-03-09 MED ORDER — MUPIROCIN 2 % EX OINT
1.0000 "application " | TOPICAL_OINTMENT | Freq: Two times a day (BID) | CUTANEOUS | Status: DC
  Administered 2015-03-09: 1 via NASAL
  Filled 2015-03-09: qty 22

## 2015-03-09 MED ORDER — ACETAMINOPHEN 325 MG PO TABS: 650.0000 mg | ORAL_TABLET | ORAL | Status: DC | PRN

## 2015-03-09 SURGICAL SUPPLY — 9 items
CATH INFINITI 5 FR IM (CATHETERS) ×1 IMPLANT
CATH INFINITI 5 FR RCB (CATHETERS) ×1 IMPLANT
CATH INFINITI 5FR MULTPACK ANG (CATHETERS) ×1 IMPLANT
KIT HEART LEFT (KITS) ×2 IMPLANT
PACK CARDIAC CATHETERIZATION (CUSTOM PROCEDURE TRAY) ×2 IMPLANT
SHEATH PINNACLE 5F 10CM (SHEATH) ×1 IMPLANT
SYR MEDRAD MARK V 150ML (SYRINGE) ×2 IMPLANT
TRANSDUCER W/STOPCOCK (MISCELLANEOUS) ×2 IMPLANT
WIRE EMERALD 3MM-J .035X150CM (WIRE) ×1 IMPLANT

## 2015-03-09 NOTE — Interval H&P Note (Signed)
Cath Lab Visit (complete for each Cath Lab visit)  Clinical Evaluation Leading to the Procedure:   ACS: No.  Non-ACS:    Anginal Classification: CCS III  Anti-ischemic medical therapy: Maximal Therapy (2 or more classes of medications)  Non-Invasive Test Results: No non-invasive testing performed  Prior CABG: Previous CABG      History and Physical Interval Note:  03/09/2015 1:16 PM  Ricardo Le  has presented today for surgery, with the diagnosis of unstable angina  The various methods of treatment have been discussed with the patient and family. After consideration of risks, benefits and other options for treatment, the patient has consented to  Procedure(s): Left Heart Cath and Cors/Grafts Angiography (N/A) as a surgical intervention .  The patient's history has been reviewed, patient examined, no change in status, stable for surgery.  I have reviewed the patient's chart and labs.  Questions were answered to the patient's satisfaction.     Jayko Voorhees A

## 2015-03-09 NOTE — Telephone Encounter (Signed)
Left message on voicemail in reference to upcoming appointment scheduled for 03/11/15. Phone number given for a call back so details instructions can be given. Ricardo Le W    

## 2015-03-09 NOTE — Progress Notes (Signed)
BR complete. Up to ambulate in room. Rt groin site benign s/s bleed or complication at site. Post cath/sheath instructions discussed with pt and family. Verbalized understanding. No questions or concerns expressed.

## 2015-03-09 NOTE — Progress Notes (Signed)
UR Completed Vaibhav Fogleman Graves-Bigelow, RN,BSN [phone]  

## 2015-03-09 NOTE — Progress Notes (Signed)
Ricardo Le  75 y.o. male with a past medical history significant for CAD/CABG and PVD He has known history of coronary artery disease status post coronary artery bypass grafting in 2009 with subsequent cardiac catheterization in May 2012 revealing native multivessel disease with 4 of 4 patent grafts.  He has a h/o PAD - s/p L Common Iliac Stent in March 2016.  He presented on January 23 with progressively worsening exertional dyspnea and chest discomfort concerning for progressive/unstable angina. This has been progressing over the course of the last month and worsening over the weekend. Dr. Johnsie Cancel was aware of the patient's symptoms, and planned a nuclear stress test, but with worsening symptoms I have recommended cardiac catheterization.  He has ruled out for MI  Subjective:  History a little heaviness and the hard heart heartbeats, but no significant angina. He has not really done much walking however. Ruled out for MI with negative enzymes, so heparin was stopped.  Objective:  Vital Signs in the last 24 hours: Temp:  [97.3 F (36.3 C)-98.1 F (36.7 C)] 98.1 F (36.7 C) (01/24 0625) Pulse Rate:  [55-67] 57 (01/24 0625) Resp:  [16-22] 18 (01/24 0625) BP: (122-181)/(55-69) 122/55 mmHg (01/24 0625) SpO2:  [95 %-97 %] 95 % (01/24 0625) Weight:  [157 lb 6.4 oz (71.396 kg)-161 lb (73.029 kg)] 157 lb 6.4 oz (71.396 kg) (01/24 0625)  Intake/Output from previous day:   Intake/Output from this shift:    Physical Exam: General appearance: alert, cooperative, appears stated age, no distress and Just looks tired and fatigued Neck: no adenopathy, no carotid bruit and no JVD Lungs: clear to auscultation bilaterally, normal percussion bilaterally and Nonlabored, good air movement Heart: regular rate and rhythm, S1, S2 normal, no murmur, click, rub or gallop and normal apical impulse Abdomen: soft, non-tender; bowel sounds normal; no masses,  no organomegaly Extremities: extremities normal, atraumatic,  no cyanosis or edema Pulses: 2+ and symmetric Neurologic: Grossly normal  Lab Results:  Recent Labs  03/08/15 1139  WBC 8.5  HGB 14.0  PLT 281    Recent Labs  03/08/15 1139 03/09/15 0220  NA 140 142  K 4.2 4.2  CL 102 104  CO2 28 28  GLUCOSE 184* 227*  BUN 22* 19  CREATININE 1.05 1.09    Recent Labs  03/08/15 2030 03/09/15 0723  TROPONINI <0.03 <0.03   Hepatic Function Panel  Recent Labs  03/09/15 0220  PROT 6.7  ALBUMIN 3.6  AST 19  ALT 16*  ALKPHOS 44  BILITOT 1.0    Recent Labs  03/09/15 0220  CHOL 164   No results for input(s): PROTIME in the last 72 hours.  Imaging: Imaging results have been reviewed  Cardiac Studies: Normal sinus rhythm on telemetry. No ischemic changes on EKG.  Assessment/Plan:  Principal Problem:   Unstable angina (HCC) Active Problems:   Atherosclerotic heart disease of native coronary artery with unstable angina pectoris (HCC)   S/P CABG x 4   Type 2 diabetes mellitus with vascular disease ([name])   Essential hypertension   Hyperlipidemia   Carotid bruit   Elevated lipids  Presenting with unstable angina. Plan cardiac catheterization today. Heparin stopped as he is ruled out for MI. On aspirin See H&P for consent discussion.  The patient is not on beta blocker due to fatigue and intolerance in the past. He is on amlodipine for antianginal and blood pressure affect. Not on statin, also due to side effect profile. He is taking fenofibrate.  Acceptable glycemic control -  he is on Lantus, but not daily insulin. I have not ordered sliding scale, monitor every before meals at bedtime glucose levels.    LOS: 1 day    [name] W 03/09/2015, 9:31 AM

## 2015-03-09 NOTE — Care Management Note (Addendum)
Case Management Note  Patient Details  Name: Ricardo Le MRN: [id number] Date of Birth: [birth year]  Subjective/Objective:  Pt admitted for chest pain. Plan for cardiac cath 03-09-15.                   Action/Plan: CM will continue to monitor for disposition needs.    Expected Discharge Date:                  Expected Discharge Plan:  Home/Self Care  In-House Referral:     Discharge planning Services  CM Consult  Post Acute Care Choice:    Choice offered to:     DME Arranged:    DME Agency:     HH Arranged:    HH Agency:     Status of Service:  In process, will continue to follow  Medicare Important Message Given:    Date Medicare IM Given:    Medicare IM give by:    Date Additional Medicare IM Given:    Additional Medicare Important Message give by:     If discussed at [place] of Stay Meetings, dates discussed:    Additional Comments:  Bethena Roys, RN 03/09/2015, 9:54 AM

## 2015-03-09 NOTE — Progress Notes (Signed)
Inpatient Diabetes Program Recommendations  AACE/ADA: New Consensus Statement on Inpatient Glycemic Control (2015)  Target Ranges:  Prepandial:   less than 140 mg/dL      Peak postprandial:   less than 180 mg/dL (1-2 hours)      Critically ill patients:  140 - 180 mg/dL   Review of Glycemic Control:  Results for Ricardo Le, Ricardo Le (MRN [id number]) as of 03/09/2015 13:52  Ref. Range 03/09/2015 08:20 03/09/2015 11:37  Glucose-Capillary Latest Ref Range: 65-99 mg/dL 136 (H) 112 (H)   Diabetes history: Type 2 diabetes Outpatient Diabetes medications: Lantus 10 units daily, Glucotrol 10 mg bid Current orders for Inpatient glycemic control:  Lantus 10 units daily, Glucotrol 10 mg bid Inpatient Diabetes Program Recommendations:   Please consider adding Novolog moderate correction tid with meals and HS while patient is in the hospital.  Thanks, Adah Perl, RN, BC-ADM Inpatient Diabetes Coordinator Pager [phone] (8a-5p)

## 2015-03-09 NOTE — Discharge Summary (Signed)
Discharge Summary    Patient ID: Ricardo Le,  MRN: [id number], DOB/AGE: 09/23/1940 75 y.o.  Admit date: 03/08/2015 Discharge date: 03/09/2015  Primary Care Provider: Angelina Sheriff Primary Cardiologist: Dr. Johnsie Cancel  Discharge Diagnoses    Principal Problem:   Unstable angina [facility]) Active Problems:   Type 2 diabetes mellitus with vascular disease ([place])   Elevated lipids   Essential hypertension   Carotid bruit   Hyperlipidemia   Atherosclerotic heart disease of native coronary artery with unstable angina pectoris (HCC)   S/P CABG x 4   Allergies Allergies  Allergen Reactions  . Rosuvastatin Calcium Other (See Comments)    Myalgias  . Iohexol Hives    pt ok with premedication (Benadryl 50mg  PO), Onset Date: [id number]   . Statins Other (See Comments)    Myalgias     Diagnostic Studies/Procedures    Cath 03/09/2015 Conclusion     LM lesion, 70% stenosed.  Prox LAD lesion, 50% stenosed.  2nd Diag lesion, 70% stenosed.  Mid LAD lesion, 70% stenosed.  Dist LAD lesion, 100% stenosed.  Prox Cx lesion, 80% stenosed.  Mid Cx lesion, 80% stenosed.  Prox RCA lesion, 100% stenosed.  Dist Cx lesion, 95% stenosed.  SVG was injected is normal in caliber, and is anatomically normal.  Post Atrio lesion, 80% stenosed.  LIMA was injected is normal in caliber, and is anatomically normal.  SVG was injected is normal in caliber, and is anatomically normal.  The left ventricular systolic function is normal.  Normal LV function without focal segmental wall motion abnormalities. Ejection fraction 55-60%.  Significant multivessel native coronary obstructive disease with evidence for coronary calcification and evidence for 70% distal left main stenosis; diffuse 50% proximal LAD stenosis with diffuse 75% diagonal-2 stenosis, 70% stenosis after the second diagonal vessel in the region of a prominent septal perforating artery with total occlusion of the mid  LAD; diffuse 80% proximal, mid and 95% mid- distal left circumflex stenosis; and total occlusion of the proximal RCA.  Patent LIMA graft supplying the mid LAD with collateralization from the apical LAD to the PLA branch of the RCA.  Patent sequential vein graft supplying the OM1 and OM 2 vessels of the left circumflex coronary artery.  Patent saphenous vein graft supplying the mid PDA vessel with evidence for 80% stenosis in the continuation branch of the RCA after the PDA takeoff in a dominant RCA system.  RECOMMENDATION: Increased medical therapy. All grafts remain patent. Several vascular distributions including the proximal LAD diagonal system which is not supplied by the LIMA graft and proximal circumflex may be responsible for potential ischemia.     _____________   History of Present Illness     This is a 75 y.o. male with a past medical history significant for CAD/CABG and PVD He has known history of coronary artery disease status post coronary artery bypass grafting in 2009 with subsequent cardiac catheterization in May 2012 revealing native multivessel disease with 4 of 4 patent grafts.  He has a h/o PAD - s/p L Common Iliac Stent in March 2016.  In the usual state of health until earlier this month when he was seen by Dr. Johnsie Cancel in clinic. He was noticing progressively worsening exertional dyspnea. He usually walks about a mile to mile and a half a treadmill everyday, but starting beginning of this year he has been unable to do more than 200 feet. This has gone progressively worse over the course of the past weekend.  When he saw Dr. Nolon Lennert on the for 10th of January, the plan was for them to do a Myoview stress test. That was planned for this week.  Unfortunately, he contacted Dr. Weber Cooks office earlier today with complaints of worsening exertional chest pressure/tightness and dyspnea over the past weekend where he cannot walk more than 100 feet without getting short of  breath. Has some discomfort at rest. He is noticing that he can feel his heart beating when he is having the symptoms. Is not an irregular palpitation or irregular beat. He is not had any heart patient is a PND, orthopnea or edema. No rapid irregular heartbeats or palpitations. No syncope/near syncope or TIA/neurosis fugax.  Based on his worsening symptoms, he contacted the office and was told to come to emergency room. The symptoms are concerning for unstable angina.  Cardiology was notified of the patient's arrival.  Hospital Course      Agitation of worsening exertional dyspnea and chest discomfort, it was concerning for progressive unstable angina. Given the worsening of his symptoms, various options has been discussed with the patient, it was recommended for him to undergo cardiac catheterization. He underwent cardiac catheterization on 03/09/2015 which showed significant multivessel native CAD was 70% distal left main, diffuse 50% proximal LAD, diffuse 75% D2 stenosis, patent LIMA to mid LAD with collateralization from apical LAD to PLA branch of the RCA, patent SVG supplying OM1 and OM2, patent SVG to mid PDA with evidence of 80% stenosis in the continuation branch of RCA after PDA takeoff. Compared to the previous cardiac catheterization, there has been no significant change. It was recommended for the patient to continue medical therapy. I have discussed with Dr. Ellyn Hack, patient is cleared for discharge from cardiology perspective. However given progressive dyspnea, we will draw a d-dimer lab prior to his discharge and will followup with result after his discharge. If the d-dimer is positive, patient will need outpatient CTA of the chest. I have sent staff message to arrange outpatient followup   Important medication changes during this admission including discontinuation of amlodipine and addition of long acting Imdur. _____________  Discharge Vitals Blood pressure 140/67, pulse 66,  temperature 97.8 F (36.6 C), temperature source Oral, resp. rate 20, height 5\' 7"  (1.702 m), weight 157 lb 6.4 oz (71.396 kg), SpO2 94 %.  Filed Weights   03/08/15 1956 03/09/15 0625  Weight: 161 lb (73.029 kg) 157 lb 6.4 oz (71.396 kg)    Labs & Radiologic Studies     CBC  Recent Labs  03/08/15 1139 03/09/15 1134  WBC 8.5 8.1  HGB 14.0 12.8*  HCT 41.1 37.9*  MCV 94.1 94.3  PLT 281 [id number]   Basic Metabolic Panel  Recent Labs  03/08/15 1139 03/09/15 0220  NA 140 142  K 4.2 4.2  CL 102 104  CO2 28 28  GLUCOSE 184* 227*  BUN 22* 19  CREATININE 1.05 1.09  CALCIUM 9.7 9.5   Liver Function Tests  Recent Labs  03/09/15 0220  AST 19  ALT 16*  ALKPHOS 44  BILITOT 1.0  PROT 6.7  ALBUMIN 3.6   Cardiac Enzymes  Recent Labs  03/08/15 2030 03/09/15 0723  TROPONINI <0.03 <0.03   Fasting Lipid Panel  Recent Labs  03/09/15 0220  CHOL 164  HDL 46  LDLCALC 98  TRIG 101  CHOLHDL 3.6   Thyroid Function Tests No results for input(s): TSH, T4TOTAL, T3FREE, THYROIDAB in the last 72 hours.  Invalid input(s): FREET3  Dg Chest 2  View  03/08/2015  CLINICAL DATA:  Chest pain, shortness of breath with exertion for 4-5 days. EXAM: CHEST  2 VIEW COMPARISON:  05/20/2014 FINDINGS: Slight elevation of the right hemidiaphragm. Right base atelectasis. Left lung is clear. Prior CABG. Heart is normal size. No acute bony abnormality. IMPRESSION: Elevated right hemidiaphragm with right base atelectasis. Electronically Signed   By: Rolm Baptise M.D.   On: 03/08/2015 12:29    Disposition   Pt is being discharged home today in good condition.  Follow-up Plans & Appointments    Follow-up Information    Follow up with Jenkins Rouge, MD.   Specialty:  Cardiology   Why:  office will contact you to arrange followup with Dr. Kyla Balzarine office, please give [place] a call if you do not hear from [place] in 2 business days   Contact information:   [id number] [place]  [id number] [phone]      Discharge Instructions    Diet - low sodium heart healthy    Complete by:  As directed      Discharge instructions    Complete by:  As directed   No lifting over 5 lbs for 1 week. No sexual activity for 1 week.  Keep procedure site clean & dry. If you notice increased pain, swelling, bleeding or pus, call/return!  You may shower, but no soaking baths/hot tubs/pools for 1 week.     Increase activity slowly    Complete by:  As directed            Discharge Medications   Current Discharge Medication List    START taking these medications   Details  isosorbide mononitrate (IMDUR) 60 MG 24 hr tablet Take 1 tablet (60 mg total) by mouth daily. Qty: 90 tablet, Refills: 3      CONTINUE these medications which have NOT CHANGED   Details  acetaminophen (TYLENOL) 325 MG tablet Take 650 mg by mouth every 6 (six) hours as needed (pain).    aspirin EC 81 MG tablet Take 81 mg by mouth at bedtime.    fenofibrate (TRICOR) 145 MG tablet Take 145 mg by mouth daily.    glipiZIDE (GLUCOTROL) 10 MG tablet Take 10 mg by mouth 2 (two) times daily.     insulin glargine (LANTUS SOLOSTAR) 100 UNIT/ML injection Inject 10 Units into the skin at bedtime.     lisinopril-hydrochlorothiazide (PRINZIDE,ZESTORETIC) 20-12.5 MG per tablet Take 1 tablet by mouth daily.        STOP taking these medications     amLODipine (NORVASC) 10 MG tablet      gabapentin (NEURONTIN) 300 MG capsule          Aspirin prescribed at discharge?  Yes High Intensity Statin Prescribed? (Lipitor 40-80mg  or Crestor 20-40mg ): No: intolerant Beta Blocker Prescribed? No: bradycardia For EF 45% or less, Was ACEI/ARB Prescribed? Yes Was EF assessed during THIS hospitalization? Yes 55%   Outstanding Labs/Studies   Pending D-Dimer  Duration of Discharge Encounter   Greater than 30 minutes including physician time.  Signed, Almyra Deforest PA-C 03/09/2015, 6:50 PM

## 2015-03-09 NOTE — H&P (View-Only) (Signed)
[name] Kitchen  75 y.o. male with a past medical history significant for CAD/CABG and PVD He has known history of coronary artery disease status post coronary artery bypass grafting in 2009 with subsequent cardiac catheterization in May 2012 revealing native multivessel disease with 4 of 4 patent grafts.  He has a h/o PAD - s/p L Common Iliac Stent in March 2016.  He presented on January 23 with progressively worsening exertional dyspnea and chest discomfort concerning for progressive/unstable angina. This has been progressing over the course of the last month and worsening over the weekend. Dr. Johnsie Cancel was aware of the patient's symptoms, and planned a nuclear stress test, but with worsening symptoms I have recommended cardiac catheterization.  He has ruled out for MI  Subjective:  History a little heaviness and the hard heart heartbeats, but no significant angina. He has not really done much walking however. Ruled out for MI with negative enzymes, so heparin was stopped.  Objective:  Vital Signs in the last 24 hours: Temp:  [97.3 F (36.3 C)-98.1 F (36.7 C)] 98.1 F (36.7 C) (01/24 0625) Pulse Rate:  [55-67] 57 (01/24 0625) Resp:  [16-22] 18 (01/24 0625) BP: (122-181)/(55-69) 122/55 mmHg (01/24 0625) SpO2:  [95 %-97 %] 95 % (01/24 0625) Weight:  [157 lb 6.4 oz (71.396 kg)-161 lb (73.029 kg)] 157 lb 6.4 oz (71.396 kg) (01/24 0625)  Intake/Output from previous day:   Intake/Output from this shift:    Physical Exam: General appearance: alert, cooperative, appears stated age, no distress and Just looks tired and fatigued Neck: no adenopathy, no carotid bruit and no JVD Lungs: clear to auscultation bilaterally, normal percussion bilaterally and Nonlabored, good air movement Heart: regular rate and rhythm, S1, S2 normal, no murmur, click, rub or gallop and normal apical impulse Abdomen: soft, non-tender; bowel sounds normal; no masses,  no organomegaly Extremities: extremities normal, atraumatic,  no cyanosis or edema Pulses: 2+ and symmetric Neurologic: Grossly normal  Lab Results:  Recent Labs  03/08/15 1139  WBC 8.5  HGB 14.0  PLT 281    Recent Labs  03/08/15 1139 03/09/15 0220  NA 140 142  K 4.2 4.2  CL 102 104  CO2 28 28  GLUCOSE 184* 227*  BUN 22* 19  CREATININE 1.05 1.09    Recent Labs  03/08/15 2030 03/09/15 0723  TROPONINI <0.03 <0.03   Hepatic Function Panel  Recent Labs  03/09/15 0220  PROT 6.7  ALBUMIN 3.6  AST 19  ALT 16*  ALKPHOS 44  BILITOT 1.0    Recent Labs  03/09/15 0220  CHOL 164   No results for input(s): PROTIME in the last 72 hours.  Imaging: Imaging results have been reviewed  Cardiac Studies: Normal sinus rhythm on telemetry. No ischemic changes on EKG.  Assessment/Plan:  Principal Problem:   Unstable angina (HCC) Active Problems:   Atherosclerotic heart disease of native coronary artery with unstable angina pectoris (HCC)   S/P CABG x 4   Type 2 diabetes mellitus with vascular disease ([place])   Essential hypertension   Hyperlipidemia   Carotid bruit   Elevated lipids  Presenting with unstable angina. Plan cardiac catheterization today. Heparin stopped as he is ruled out for MI. On aspirin See H&P for consent discussion.  The patient is not on beta blocker due to fatigue and intolerance in the past. He is on amlodipine for antianginal and blood pressure affect. Not on statin, also due to side effect profile. He is taking fenofibrate.  Acceptable glycemic control -  he is on Lantus, but not daily insulin. I have not ordered sliding scale, monitor every before meals at bedtime glucose levels.    LOS: 1 day    HARDING, DAVID W 03/09/2015, 9:31 AM

## 2015-03-10 ENCOUNTER — Telehealth: Payer: Self-pay | Admitting: Physician Assistant

## 2015-03-10 LAB — HEMOGLOBIN A1C
Hgb A1c MFr Bld: 6.4 % — ABNORMAL HIGH (ref 4.8–5.6)
Mean Plasma Glucose: 137 mg/dL

## 2015-03-10 NOTE — Telephone Encounter (Signed)
Informed patient negative d-dimer result.  Ricardo Corrigan PA Pager: [phone]

## 2015-03-11 ENCOUNTER — Telehealth: Payer: Self-pay | Admitting: Cardiovascular Disease

## 2015-03-11 ENCOUNTER — Encounter (HOSPITAL_COMMUNITY): Payer: PPO

## 2015-03-11 NOTE — Telephone Encounter (Signed)
Patient was recently discharged from the hospital. Patient's spouse is calling to inform our office that patient has four new medications that were not on his list at the hospital, because they could not remember them.   Omeprazole 20 mg by mouth daily Tamsulosin  0.4 mg by mouth at night Terbinafine 250 mg by mouth daily Dicyclomine 20 mg by mouth AC/HS  Patient is wanting to know if these are compatible with Imdur, that was started at the hospital. Checked with Pharmacy, and this new medications should be fine for patient to take with Imdur.  Patient's medication list updated.

## 2015-03-11 NOTE — Telephone Encounter (Signed)
New message     Patient wife calling need clarification on recent discharge medcation

## 2015-03-18 ENCOUNTER — Other Ambulatory Visit: Payer: Self-pay

## 2015-03-18 NOTE — Patient Outreach (Signed)
I received a referral from [facility] care management to reach out to Ricardo Le to see if I could assist with him being able to afford his fenofibrate.  Currently it is costing him around $47.  I stated his version of fenofibrate is a Tier 3.  There is a fenofibrate which is a Tier 2 but he would have to take it with food.  I advised him to have a conversation with his provider about fenofibrate 160 mg if he would like to save money.  He state he would.  I will close him to pharmacy since his pharmacy issues have been resolved.  I have contacted Ann at [facility] to let her know of my findings.  I am happy to assist in the future if other pharmacy issues arise.    Deanne Coffer, PharmD, [place] [phone]

## 2015-04-21 DIAGNOSIS — R300 Dysuria: Secondary | ICD-10-CM | POA: Diagnosis not present

## 2015-04-21 DIAGNOSIS — N419 Inflammatory disease of prostate, unspecified: Secondary | ICD-10-CM | POA: Diagnosis not present

## 2015-04-21 DIAGNOSIS — Z0000 Encounter for general adult medical examination without abnormal findings: Secondary | ICD-10-CM | POA: Diagnosis not present

## 2015-04-27 DIAGNOSIS — E1165 Type 2 diabetes mellitus with hyperglycemia: Secondary | ICD-10-CM | POA: Diagnosis not present

## 2015-05-04 DIAGNOSIS — Z1389 Encounter for screening for other disorder: Secondary | ICD-10-CM | POA: Diagnosis not present

## 2015-05-04 DIAGNOSIS — E7800 Pure hypercholesterolemia, unspecified: Secondary | ICD-10-CM | POA: Diagnosis not present

## 2015-05-04 DIAGNOSIS — E1165 Type 2 diabetes mellitus with hyperglycemia: Secondary | ICD-10-CM | POA: Diagnosis not present

## 2015-05-04 DIAGNOSIS — E785 Hyperlipidemia, unspecified: Secondary | ICD-10-CM | POA: Diagnosis not present

## 2015-05-04 DIAGNOSIS — I10 Essential (primary) hypertension: Secondary | ICD-10-CM | POA: Diagnosis not present

## 2015-05-21 DIAGNOSIS — J329 Chronic sinusitis, unspecified: Secondary | ICD-10-CM | POA: Diagnosis not present

## 2015-05-21 DIAGNOSIS — J40 Bronchitis, not specified as acute or chronic: Secondary | ICD-10-CM | POA: Diagnosis not present

## 2015-05-26 DIAGNOSIS — Z0000 Encounter for general adult medical examination without abnormal findings: Secondary | ICD-10-CM | POA: Diagnosis not present

## 2015-05-26 DIAGNOSIS — N401 Enlarged prostate with lower urinary tract symptoms: Secondary | ICD-10-CM | POA: Diagnosis not present

## 2015-05-26 DIAGNOSIS — N138 Other obstructive and reflux uropathy: Secondary | ICD-10-CM | POA: Diagnosis not present

## 2015-05-26 DIAGNOSIS — N419 Inflammatory disease of prostate, unspecified: Secondary | ICD-10-CM | POA: Diagnosis not present

## 2015-05-31 ENCOUNTER — Ambulatory Visit (INDEPENDENT_AMBULATORY_CARE_PROVIDER_SITE_OTHER): Payer: PPO | Admitting: Cardiovascular Disease

## 2015-05-31 ENCOUNTER — Encounter: Payer: Self-pay | Admitting: Cardiovascular Disease

## 2015-05-31 DIAGNOSIS — R0789 Other chest pain: Secondary | ICD-10-CM

## 2015-05-31 DIAGNOSIS — R002 Palpitations: Secondary | ICD-10-CM | POA: Diagnosis not present

## 2015-05-31 NOTE — Progress Notes (Signed)
Patient ID: Ricardo Le, male   DOB: [birth year], 75 y.o.   MRN: [id number]     Primary care physician: Dr. Lin Le Primary cardiologist: Dr. Johnsie Le  HPI  [name] is a 75 y.o.  patient who is here today for a follow up CAD/CABG and PVD  He has known history of coronary artery disease status post coronary artery bypass grafting in 2009 with subsequent cardiac catheterization in May 2012 revealing native multivessel disease with 4 of 4 patent grafts.   Other medical problems include pancreatic cancer 5 years ago which was treated successfully. He also reports history of retinal problems. He was told by his ophthalmologist that he should avoid blood thinners due to risk of bleeding.  He has history of intolerance to statins due to myalgia.   He is status post left common iliac artery stent placement in March 2016. He is known to have bilateral SFA occlusion with 1 vessel runoff below the knee bilaterally. He has been doing reasonably well overall with no recurrent claudication. Most recent noninvasive vascular evaluation showed a ABI of 0.71  Bilaterally with patent left common iliac stent.  He is known to have mild bilateral carotid disease and will be getting carotid Doppler next month.  Recently hospitalized for increasing dyspnea.  Cath reviewed and still had patent grafts.  Imdur added 03/09/15 Cath Dr Ricardo Le:  Conclusion     LM lesion, 70% stenosed.  Prox LAD lesion, 50% stenosed.  2nd Diag lesion, 70% stenosed.  Mid LAD lesion, 70% stenosed.  Dist LAD lesion, 100% stenosed.  Prox Cx lesion, 80% stenosed.  Mid Cx lesion, 80% stenosed.  Prox RCA lesion, 100% stenosed.  Dist Cx lesion, 95% stenosed.  SVG was injected is normal in caliber, and is anatomically normal.  Post Atrio lesion, 80% stenosed.  LIMA was injected is normal in caliber, and is anatomically normal.  SVG was injected is normal in caliber, and is anatomically normal.  The left ventricular  systolic function is normal.  Normal LV function without focal segmental wall motion abnormalities. Ejection fraction 55-60%.  Significant multivessel native coronary obstructive disease with evidence for coronary calcification and evidence for 70% distal left main stenosis; diffuse 50% proximal LAD stenosis with diffuse 75% diagonal-2 stenosis, 70% stenosis after the second diagonal vessel in the region of a prominent septal perforating artery with total occlusion of the mid LAD; diffuse 80% proximal, mid and 95% mid- distal left circumflex stenosis; and total occlusion of the proximal RCA.  Patent LIMA graft supplying the mid LAD with collateralization from the apical LAD to the PLA branch of the RCA.  Patent sequential vein graft supplying the OM1 and OM 2 vessels of the left circumflex coronary artery.  Patent saphenous vein graft supplying the mid PDA vessel with evidence for 80% stenosis in the continuation branch of the RCA after the PDA takeoff in a dominant RCA system.  RECOMMENDATION: Increased medical therapy. All grafts remain patent. Several vascular distributions including the proximal LAD diagonal system which is not supplied by the LIMA graft and proximal circumflex may be responsible for potential ischemia.       Allergies  Allergen Reactions  . Rosuvastatin Calcium Other (See Comments)    Myalgias  . Iohexol Hives    pt ok with premedication (Benadryl 50mg  PO), Onset Date: [id number]   . Statins Other (See Comments)    Myalgias      Current Outpatient Prescriptions on File Prior to Visit  Medication Sig Dispense Refill  .  acetaminophen (TYLENOL) 325 MG tablet Take 650 mg by mouth every 6 (six) hours as needed (pain).    [name] Kitchen aspirin EC 81 MG tablet Take 81 mg by mouth at bedtime.    . dicyclomine (BENTYL) 20 MG tablet Take 20 mg by mouth 4 (four) times daily -  before meals and at bedtime.    . fenofibrate (TRICOR) 145 MG tablet Take 145 mg by mouth daily.      [name] Kitchen glipiZIDE (GLUCOTROL) 10 MG tablet Take 10 mg by mouth 2 (two) times daily.     . insulin glargine (LANTUS SOLOSTAR) 100 UNIT/ML injection Inject 10 Units into the skin at bedtime.     . isosorbide mononitrate (IMDUR) 60 MG 24 hr tablet Take 1 tablet (60 mg total) by mouth daily. 90 tablet 3  . lisinopril-hydrochlorothiazide (PRINZIDE,ZESTORETIC) 20-12.5 MG per tablet Take 1 tablet by mouth daily.      [name] Kitchen omeprazole (PRILOSEC) 20 MG capsule Take 20 mg by mouth daily.    . tamsulosin (FLOMAX) 0.4 MG CAPS capsule Take 0.4 mg by mouth daily after supper.    . terbinafine (LAMISIL) 250 MG tablet Take 250 mg by mouth daily.    . [DISCONTINUED] metFORMIN (GLUCOPHAGE) 500 MG tablet Take 500 mg by mouth daily.      . [DISCONTINUED] simvastatin (ZOCOR) 40 MG tablet Take 40 mg by mouth at bedtime.      . [DISCONTINUED] sucralfate (CARAFATE) 1 GM/10ML suspension 10cc by mouth two times a day      No current facility-administered medications on file prior to visit.     Past Medical History  Diagnosis Date  . Palpitations   . CAD (coronary artery disease)     a. 04/2007 s/p CABG x 4  - LIMA->LAD, VG->OM1->Om2, VG->PDA;  b. 12/2008 & 06/2010 Caths - Native multivessel dzs with 4/4 patent grafts. c. cath 03/09/2015 patent grafts, unchanged from prior cath.  . Pure hypercholesterolemia     statin intolerance  . DDD (degenerative disc disease)     several surgeries  . Carotid bruit     a. 07/2010 U/S- 0-39% bilat ICA stenosis  . Nephrolithiasis   . HNP (herniated nucleus pulposus), lumbar     a. L2-3, s/p laminotomy, microdiskectomy 02/2009  . Pancreatic mass     a. 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 Chest pain     Noncardiac probably related to reflux  . Unspecified essential hypertension   . Pneumonia     hx of  pna  . Type II or unspecified type diabetes mellitus without mention of complication, not stated as uncontrolled     insulin dependent  . GERD  (gastroesophageal reflux disease)   . Cancer [facility])     pancreatic  March 2011  . HOH (hard of hearing)     uses amplifiers   . PAD (peripheral artery disease) ([place]) 04/2014    s/p L Common Iliac Stent     Past Surgical History  Procedure Laterality Date  . Re-exploratin of laminectomy  05/2008    (RT L2-3) WITH REDO MICRODISKECTOMY  . Cyst removed      FROM SPINE  . Nephrolithotomy      PERCUTANEOUS  . Coronary artery bypass graft      X 4 2009 (LIMA to LAD, SVG to first cicumflex marginal branch with sequential SVG to second cicumflex marginal branch  and saphenous vein to posterior descending coronary artery with endoscopic,vein harvest rt. lower exttremity by Dr.Owen  on March 31,2009  . Cystourethroscopy      right retrograde pyelogram,manipulate stone in the renal pelvis, rt. double-j catheter.  . Pars plana vitrectomy      lt. eye,retinal photocoagulation lt. eye, membrane peel lt. eye.  . Cholecystectomy    . Appendectomy    . Arthroscopy right knee    . Back surgery    . Knee surgery      rt knee  . Abdominal aortagram N/A 05/13/2014    Procedure: ABDOMINAL [name];  Surgeon: Wellington Hampshire, MD;  Location[facility] CATH LAB;  Service: Cardiovascular;  Laterality: N/A;  . Percutaneous stent intervention Left 05/13/2014    Procedure: PERCUTANEOUS STENT INTERVENTION;  Surgeon: Wellington Hampshire, MD;  Location: [place] CATH LAB;  Service: Cardiovascular;  Laterality: Left;  COMMON ILLIAC  . Eye surgery    . Anterior cervical decomp/discectomy fusion N/A 07/28/2014    Procedure: ANTERIOR CERVICAL DECOMPRESSION/DISCECTOMY FUSION CERVICAL FOUR-FIVE CERVICAL FIVE-SIX ;  Surgeon: Earnie Larsson, MD;  Location: [place] NEURO ORS;  Service: Neurosurgery;  Laterality: N/A;  . Cardiac catheterization N/A 03/09/2015    Procedure: Left Heart Cath and Cors/Grafts Angiography;  Surgeon: [name] Sine, MD;  Location: [place] CV LAB;  Service: Cardiovascular;  Laterality: N/A;     Family History   Problem Relation Age of Onset  . Heart attack Father     died of MI @ 39  . Diabetes Brother     type 2  . Other      no fh of colon cancer  . Other Mother     died @ 33  . Coronary artery disease Brother     s/p CABG.  Alive @ 84  . Coronary artery disease Sister     alive     Social History   Social History  . Marital Status: Married    Spouse Name: N/A  . Number of Children: N/A  . Years of Education: N/A   Occupational History  . Not on file.   Social History Main Topics  . Smoking status: Never Smoker   . Smokeless tobacco: Never Used  . Alcohol Use: No  . Drug Use: No  . Sexual Activity: Not Currently   Other Topics Concern  . Not on file   Social History Narrative   Lives in [place] with wife.  Retired Company secretary.  Walks 1.67mi daily.  Does not work 2/2 back problems.    A 10 point review of system was performed. It is negative other than that mentioned in the history of present illness.  ECG:  05/20/14  SR rate 88 LAD poor R wave progression ? Old anterior MI   PHYSICAL EXAM   BP 122/48 mmHg  Pulse 61  Ht 5\' 7"  (1.702 m)  Wt 75.025 kg (165 lb 6.4 oz)  BMI 25.90 kg/m2  SpO2 95% Constitutional: He is oriented to person, place, and time. He appears well-developed and well-nourished. No distress.  HENT: No nasal discharge.  Head: Normocephalic and atraumatic.  Eyes: Pupils are equal and round. Right eye exhibits no discharge. Left eye exhibits no discharge.  Neck: Normal range of motion. Neck supple. No JVD present. No thyromegaly present.  Bilateral carotid bruits. Cardiovascular: Normal rate, regular rhythm, normal heart sounds and. Exam reveals no gallop and no friction rub. No murmur heard.  Pulmonary/Chest: Effort normal and breath sounds normal. No stridor. No respiratory distress. He has no wheezes. He has no rales. He exhibits no tenderness.  Abdominal: Soft. Bowel  sounds are normal. He exhibits no distension. There is no tenderness. There is no  rebound and no guarding.  Musculoskeletal: Normal range of motion. He exhibits no edema and no tenderness.  Neurological: He is alert and oriented to person, place, and time. Coordination normal.  Skin: Skin is warm and dry. No rash noted. He is not diaphoretic. No erythema. No pallor.  Psychiatric: He has a normal mood and affect. His behavior is normal. Judgment and thought content normal.  vascular: Femoral pulses : +2 on the right side and +1 on the left side . Distal pulses are not palpable.    ASSESSMENT AND PLAN  CAD/CABG: 2009 grafts patent by cath  03/12/15 with collateralized PLA  EF normal continue medical Rx unable to tolerate imdur Consider adding Ranexa in future  PVD:  ABI's around .71 claudication stable f/u [place] History of left  iliac artery stent with bilateral 1 V run off Carotids: Reviewed carotids from 02/17/15 plaque no stenosis f/u due 02/2017   Jenkins Rouge

## 2015-05-31 NOTE — Patient Instructions (Addendum)
Medication Instructions:  Your physician has recommended you make the following change in your medication:  1-STOP IMDUR  Labwork: NONE  Testing/Procedures: NONE  Follow-Up: Your physician recommends that you schedule a follow-up appointment with Dr. [name] for Peripheral vascular disease.  If you need a refill on your cardiac medications before your next appointment, please call your pharmacy.

## 2015-06-22 DIAGNOSIS — H6123 Impacted cerumen, bilateral: Secondary | ICD-10-CM | POA: Diagnosis not present

## 2015-06-22 DIAGNOSIS — B9689 Other specified bacterial agents as the cause of diseases classified elsewhere: Secondary | ICD-10-CM | POA: Diagnosis not present

## 2015-06-22 DIAGNOSIS — J208 Acute bronchitis due to other specified organisms: Secondary | ICD-10-CM | POA: Diagnosis not present

## 2015-07-22 DIAGNOSIS — Z0000 Encounter for general adult medical examination without abnormal findings: Secondary | ICD-10-CM | POA: Diagnosis not present

## 2015-07-22 DIAGNOSIS — D519 Vitamin B12 deficiency anemia, unspecified: Secondary | ICD-10-CM | POA: Diagnosis not present

## 2015-07-22 DIAGNOSIS — Z79899 Other long term (current) drug therapy: Secondary | ICD-10-CM | POA: Diagnosis not present

## 2015-07-22 DIAGNOSIS — E785 Hyperlipidemia, unspecified: Secondary | ICD-10-CM | POA: Diagnosis not present

## 2015-07-29 ENCOUNTER — Ambulatory Visit (INDEPENDENT_AMBULATORY_CARE_PROVIDER_SITE_OTHER): Payer: PPO | Admitting: Ophthalmology

## 2015-07-29 DIAGNOSIS — H35033 Hypertensive retinopathy, bilateral: Secondary | ICD-10-CM | POA: Diagnosis not present

## 2015-07-29 DIAGNOSIS — I10 Essential (primary) hypertension: Secondary | ICD-10-CM | POA: Diagnosis not present

## 2015-07-29 DIAGNOSIS — E113593 Type 2 diabetes mellitus with proliferative diabetic retinopathy without macular edema, bilateral: Secondary | ICD-10-CM | POA: Diagnosis not present

## 2015-07-29 DIAGNOSIS — E11319 Type 2 diabetes mellitus with unspecified diabetic retinopathy without macular edema: Secondary | ICD-10-CM

## 2015-07-29 DIAGNOSIS — E049 Nontoxic goiter, unspecified: Secondary | ICD-10-CM | POA: Diagnosis not present

## 2015-08-24 ENCOUNTER — Encounter: Payer: Self-pay | Admitting: Cardiovascular Disease

## 2015-08-24 ENCOUNTER — Ambulatory Visit (INDEPENDENT_AMBULATORY_CARE_PROVIDER_SITE_OTHER): Payer: PPO | Admitting: Cardiovascular Disease

## 2015-08-24 VITALS — BP 167/74 | HR 58 | Ht 67.5 in | Wt 161.4 lb

## 2015-08-24 DIAGNOSIS — E785 Hyperlipidemia, unspecified: Secondary | ICD-10-CM

## 2015-08-24 DIAGNOSIS — Z79899 Other long term (current) drug therapy: Secondary | ICD-10-CM | POA: Diagnosis not present

## 2015-08-24 DIAGNOSIS — I739 Peripheral vascular disease, unspecified: Secondary | ICD-10-CM | POA: Diagnosis not present

## 2015-08-24 MED ORDER — ATORVASTATIN CALCIUM 10 MG PO TABS: 10.0000 mg | ORAL_TABLET | Freq: Every day | ORAL | Status: DC

## 2015-08-24 NOTE — Progress Notes (Signed)
Cardiology Office Note   Date:  08/24/2015   ID:  Ricardo Le, DOB 11-18-1940, MRN [id number]  PCP:  Angelina Sheriff., MD  Cardiologist:  Dr. Johnsie Cancel  Chief Complaint  Patient presents with  . Follow-up    Pt states no Sx.      History of Present Illness: Ricardo Le is a 75 y.o. male who presents for a follow up visit regarding PAD. He has known history of coronary artery disease status post coronary artery bypass grafting in 2009 . He presented in 02/2015 with chest pain. Cardiac cath showed patent grafts.   other medical problems include pancreatic cancer 5 years ago which was treated successfully. He also reports history of retinal problems. He was told by his ophthalmologist that he should avoid blood thinners due to risk of bleeding. He has history of intolerance to statins due to myalgia.   he is status post left common iliac artery stent placement in March 2016. He is known to have bilateral SFA occlusion with 1 vessel runoff below the knee bilaterally.  he continued to have neuropathic pain and ultimately underwent neck surgery in June of this year with improvement in symptoms.  he is known to have mild bilateral carotid disease and will be getting carotid Doppler next month. He has been doing well overall with stable mild bilateral calf claudication. No chest pain or shortness of breath. He does not feel limited by his claudication.    Past Medical History  Diagnosis Date  . Palpitations   . CAD (coronary artery disease)     a. 04/2007 s/p CABG x 4  - LIMA->LAD, VG->OM1->Om2, VG->PDA;  b. 12/2008 & 06/2010 Caths - Native multivessel dzs with 4/4 patent grafts. c. cath 03/09/2015 patent grafts, unchanged from prior cath.  . Pure hypercholesterolemia     statin intolerance  . DDD (degenerative disc disease)     several surgeries  . Carotid bruit     a. 07/2010 U/S- 0-39% bilat ICA stenosis  . Nephrolithiasis   . HNP (herniated nucleus pulposus), lumbar    a. L2-3, s/p laminotomy, microdiskectomy 02/2009  . Pancreatic mass     a. 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 Chest pain     Noncardiac probably related to reflux  . Unspecified essential hypertension   . Pneumonia     hx of  pna  . Type II or unspecified type diabetes mellitus without mention of complication, not stated as uncontrolled     insulin dependent  . GERD (gastroesophageal reflux disease)   . Cancer [facility])     pancreatic  March 2011  . HOH (hard of hearing)     uses amplifiers   . PAD (peripheral artery disease) ([place]) 04/2014    s/p L Common Iliac Stent    Past Surgical History  Procedure Laterality Date  . Re-exploratin of laminectomy  05/2008    (RT L2-3) WITH REDO MICRODISKECTOMY  . Cyst removed      FROM SPINE  . Nephrolithotomy      PERCUTANEOUS  . Coronary artery bypass graft      X 4 2009 (LIMA to LAD, SVG to first cicumflex marginal branch with sequential SVG to second cicumflex marginal branch  and saphenous vein to posterior descending coronary artery with endoscopic,vein harvest rt. lower exttremity by Dr.Owen on March 31,2009  . Cystourethroscopy      right retrograde pyelogram,manipulate stone in the renal pelvis, rt. double-j catheter.  [name] Kitchen  Pars plana vitrectomy      lt. eye,retinal photocoagulation lt. eye, membrane peel lt. eye.  . Cholecystectomy    . Appendectomy    . Arthroscopy right knee    . Back surgery    . Knee surgery      rt knee  . Abdominal aortagram N/A 05/13/2014    Procedure: ABDOMINAL [name];  Surgeon: Wellington Hampshire, MD;  Location: [facility] CATH LAB;  Service: Cardiovascular;  Laterality: N/A;  . Percutaneous stent intervention Left 05/13/2014    Procedure: PERCUTANEOUS STENT INTERVENTION;  Surgeon: Wellington Hampshire, MD;  Location: [place] CATH LAB;  Service: Cardiovascular;  Laterality: Left;  COMMON ILLIAC  . Eye surgery    . Anterior cervical decomp/discectomy fusion N/A 07/28/2014    Procedure: ANTERIOR CERVICAL  DECOMPRESSION/DISCECTOMY FUSION CERVICAL FOUR-FIVE CERVICAL FIVE-SIX ;  Surgeon: Earnie Larsson, MD;  Location: [place] NEURO ORS;  Service: Neurosurgery;  Laterality: N/A;  . Cardiac catheterization N/A 03/09/2015    Procedure: Left Heart Cath and Cors/Grafts Angiography;  Surgeon: [name] Sine, MD;  Location: [place] CV LAB;  Service: Cardiovascular;  Laterality: N/A;     Current Outpatient Prescriptions  Medication Sig Dispense Refill  . acetaminophen (TYLENOL) 325 MG tablet Take 650 mg by mouth every 6 (six) hours as needed (pain).    [name] Kitchen aspirin EC 81 MG tablet Take 81 mg by mouth at bedtime.    . dicyclomine (BENTYL) 20 MG tablet Take 20 mg by mouth 4 (four) times daily -  before meals and at bedtime.    . fenofibrate (TRICOR) 145 MG tablet Take 145 mg by mouth daily.    [name] Kitchen glipiZIDE (GLUCOTROL) 10 MG tablet Take 10 mg by mouth 2 (two) times daily.     . insulin glargine (LANTUS SOLOSTAR) 100 UNIT/ML injection Inject 10 Units into the skin at bedtime.     [name] Kitchen lisinopril-hydrochlorothiazide (PRINZIDE,ZESTORETIC) 20-12.5 MG per tablet Take 1 tablet by mouth daily.      [name] Kitchen omeprazole (PRILOSEC) 20 MG capsule Take 20 mg by mouth daily.    . tamsulosin (FLOMAX) 0.4 MG CAPS capsule Take 0.4 mg by mouth daily after supper.    . terbinafine (LAMISIL) 250 MG tablet Take 250 mg by mouth daily.    . [DISCONTINUED] metFORMIN (GLUCOPHAGE) 500 MG tablet Take 500 mg by mouth daily.      . [DISCONTINUED] simvastatin (ZOCOR) 40 MG tablet Take 40 mg by mouth at bedtime.      . [DISCONTINUED] sucralfate (CARAFATE) 1 GM/10ML suspension 10cc by mouth two times a day      No current facility-administered medications for this visit.    Allergies:   Rosuvastatin calcium; Iohexol; and Statins    Social History:  The patient  reports that he has never smoked. He has never used smokeless tobacco. He reports that he does not drink alcohol or use illicit drugs.   Family History:  The patient's family history includes  Coronary artery disease in his brother and sister; Diabetes in his brother; Heart attack in his father; Other in his mother.    ROS:  Please see the history of present illness.   Otherwise, review of systems are positive for none.   All other systems are reviewed and negative.    PHYSICAL EXAM: VS:  BP 167/74 mmHg  Pulse 58  Ht 5' 7.5" (1.715 m)  Wt 161 lb 6.4 oz (73.211 kg)  BMI 24.89 kg/m2 , BMI Body mass index is 24.89 kg/(m^2). GEN: Well nourished, well  developed, in no acute distress HEENT: normal Neck: no JVD, carotid bruits, or masses Cardiac: RRR; no murmurs, rubs, or gallops,no edema  Respiratory:  clear to auscultation bilaterally, normal work of breathing GI: soft, nontender, nondistended, + BS MS: no deformity or atrophy Skin: warm and dry, no rash Neuro:  Strength and sensation are intact Psych: euthymic mood, full affect Vascular: Femoral pulses are normal. Distal pulses are not palpable.  EKG:  EKG is not ordered today.    Recent Labs: 03/09/2015: ALT 16*; BUN 19; Creatinine, Ser 1.09; Hemoglobin 12.8*; Platelets 286; Potassium 4.2; Sodium 142    Lipid Panel    Component Value Date/Time   CHOL 164 03/09/2015 0220   TRIG 101 03/09/2015 0220   HDL 46 03/09/2015 0220   CHOLHDL 3.6 03/09/2015 0220   VLDL 20 03/09/2015 0220   LDLCALC 98 03/09/2015 0220      Wt Readings from Last 3 Encounters:  08/24/15 161 lb 6.4 oz (73.211 kg)  05/31/15 165 lb 6.4 oz (75.025 kg)  03/09/15 157 lb 6.4 oz (71.396 kg)         ASSESSMENT AND PLAN:  1.  Peripheral arterial disease: Status post left iliac artery stenting. He is known to have bilateral SFA occlusion with stable mild bilateral calf claudication. Continue medical therapy. He is due for an aortoiliac duplex which would be scheduled for next month.  2. Coronary artery disease involving native coronary arteries without angina: Cardiac catheterization in January showed patent grafts.  3. Hyperlipidemia:  Reported intolerance to statins but might have been related to claudication. He is willing to try a statin again and thus I switched him from fenofibrate to atorvastatin 10 mg daily. Check fasting lipid and liver profile in one month.  4. Essential hypertension: Blood pressure is mildly elevated but his blood pressure is usually more controlled. Continue same medications.  5. Bilateral carotid disease: Most recent duplex in January showed mild atherosclerosis.     Disposition:   FU with me in 1 year  Signed, Kathlyn Sacramento, MD  08/24/2015 8:06 AM    [place]

## 2015-08-24 NOTE — Patient Instructions (Addendum)
Medication Instructions:  STOP Fenofibrate and START Atorvastatin 10 mg daily  Labwork: Fasting Lipid Liver 1 Month  Testing/Procedures: Your physician have requested that you have an Aorta Iliac duplex  Follow-Up: Your physician wants you to follow-up in: 1 Year. You will receive a reminder letter in the mail two months in advance. If you don't receive a letter, please call our office to schedule the follow-up appointment.   Any Other Special Instructions Will Be Listed Below (If Applicable).   If you need a refill on your cardiac medications before your next appointment, please call your pharmacy.

## 2015-09-20 ENCOUNTER — Other Ambulatory Visit: Payer: Self-pay | Admitting: Cardiovascular Disease

## 2015-09-20 DIAGNOSIS — I739 Peripheral vascular disease, unspecified: Secondary | ICD-10-CM

## 2015-09-27 ENCOUNTER — Ambulatory Visit (HOSPITAL_COMMUNITY)
Admission: RE | Admit: 2015-09-27 | Discharge: 2015-09-27 | Disposition: A | Discharge status: Home / Self Care | Payer: PPO | Source: Ambulatory Visit | Attending: Cardiovascular Disease | Admitting: Cardiovascular Disease

## 2015-09-27 DIAGNOSIS — I700 Atherosclerosis of aorta: Secondary | ICD-10-CM | POA: Diagnosis not present

## 2015-09-27 DIAGNOSIS — K219 Gastro-esophageal reflux disease without esophagitis: Secondary | ICD-10-CM | POA: Diagnosis not present

## 2015-09-27 DIAGNOSIS — R938 Abnormal findings on diagnostic imaging of other specified body structures: Secondary | ICD-10-CM | POA: Insufficient documentation

## 2015-09-27 DIAGNOSIS — I739 Peripheral vascular disease, unspecified: Secondary | ICD-10-CM

## 2015-09-27 DIAGNOSIS — I10 Essential (primary) hypertension: Secondary | ICD-10-CM | POA: Insufficient documentation

## 2015-09-27 DIAGNOSIS — E7800 Pure hypercholesterolemia, unspecified: Secondary | ICD-10-CM | POA: Diagnosis not present

## 2015-09-27 DIAGNOSIS — E119 Type 2 diabetes mellitus without complications: Secondary | ICD-10-CM | POA: Insufficient documentation

## 2015-09-27 DIAGNOSIS — I2510 Atherosclerotic heart disease of native coronary artery without angina pectoris: Secondary | ICD-10-CM | POA: Diagnosis not present

## 2015-10-13 ENCOUNTER — Telehealth: Payer: Self-pay | Admitting: Cardiovascular Disease

## 2015-10-13 NOTE — Telephone Encounter (Signed)
Pt would like his test resuts from 09-27-15 please.

## 2015-10-14 DIAGNOSIS — E1165 Type 2 diabetes mellitus with hyperglycemia: Secondary | ICD-10-CM | POA: Diagnosis not present

## 2015-10-14 NOTE — Telephone Encounter (Signed)
Left message on machine for pt to contact the office.   

## 2015-10-15 NOTE — Telephone Encounter (Signed)
Pt aware of vascular duplex results by phone. The pt said he had fasting lab work drawn yesterday at Dr Janace Aris office in preparation for physical next week. I asked the pt to request that a copy of his labs be faxed to our office for his records. Pt agreed.

## 2015-10-20 DIAGNOSIS — I10 Essential (primary) hypertension: Secondary | ICD-10-CM | POA: Diagnosis not present

## 2015-10-20 DIAGNOSIS — E1165 Type 2 diabetes mellitus with hyperglycemia: Secondary | ICD-10-CM | POA: Diagnosis not present

## 2015-10-20 DIAGNOSIS — E7800 Pure hypercholesterolemia, unspecified: Secondary | ICD-10-CM | POA: Diagnosis not present

## 2015-10-20 DIAGNOSIS — Z1211 Encounter for screening for malignant neoplasm of colon: Secondary | ICD-10-CM | POA: Diagnosis not present

## 2015-11-04 DIAGNOSIS — L570 Actinic keratosis: Secondary | ICD-10-CM | POA: Diagnosis not present

## 2015-12-10 DIAGNOSIS — Z2821 Immunization not carried out because of patient refusal: Secondary | ICD-10-CM | POA: Diagnosis not present

## 2015-12-10 DIAGNOSIS — I10 Essential (primary) hypertension: Secondary | ICD-10-CM | POA: Diagnosis not present

## 2015-12-10 DIAGNOSIS — E7800 Pure hypercholesterolemia, unspecified: Secondary | ICD-10-CM | POA: Diagnosis not present

## 2015-12-10 DIAGNOSIS — E1165 Type 2 diabetes mellitus with hyperglycemia: Secondary | ICD-10-CM | POA: Diagnosis not present

## 2016-01-31 ENCOUNTER — Ambulatory Visit (INDEPENDENT_AMBULATORY_CARE_PROVIDER_SITE_OTHER): Payer: PPO | Admitting: Ophthalmology

## 2016-01-31 DIAGNOSIS — H35033 Hypertensive retinopathy, bilateral: Secondary | ICD-10-CM | POA: Diagnosis not present

## 2016-01-31 DIAGNOSIS — E11319 Type 2 diabetes mellitus with unspecified diabetic retinopathy without macular edema: Secondary | ICD-10-CM | POA: Diagnosis not present

## 2016-01-31 DIAGNOSIS — E113593 Type 2 diabetes mellitus with proliferative diabetic retinopathy without macular edema, bilateral: Secondary | ICD-10-CM

## 2016-01-31 DIAGNOSIS — I10 Essential (primary) hypertension: Secondary | ICD-10-CM

## 2016-03-07 DIAGNOSIS — E1165 Type 2 diabetes mellitus with hyperglycemia: Secondary | ICD-10-CM | POA: Diagnosis not present

## 2016-03-14 DIAGNOSIS — Z9181 History of falling: Secondary | ICD-10-CM | POA: Diagnosis not present

## 2016-03-14 DIAGNOSIS — E7800 Pure hypercholesterolemia, unspecified: Secondary | ICD-10-CM | POA: Diagnosis not present

## 2016-03-14 DIAGNOSIS — I10 Essential (primary) hypertension: Secondary | ICD-10-CM | POA: Diagnosis not present

## 2016-03-14 DIAGNOSIS — E1165 Type 2 diabetes mellitus with hyperglycemia: Secondary | ICD-10-CM | POA: Diagnosis not present

## 2016-03-30 DIAGNOSIS — L578 Other skin changes due to chronic exposure to nonionizing radiation: Secondary | ICD-10-CM | POA: Diagnosis not present

## 2016-03-30 DIAGNOSIS — B079 Viral wart, unspecified: Secondary | ICD-10-CM | POA: Diagnosis not present

## 2016-03-30 DIAGNOSIS — L570 Actinic keratosis: Secondary | ICD-10-CM | POA: Diagnosis not present

## 2016-06-23 DIAGNOSIS — L578 Other skin changes due to chronic exposure to nonionizing radiation: Secondary | ICD-10-CM | POA: Diagnosis not present

## 2016-06-23 DIAGNOSIS — C4402 Squamous cell carcinoma of skin of lip: Secondary | ICD-10-CM | POA: Diagnosis not present

## 2016-06-23 DIAGNOSIS — C44529 Squamous cell carcinoma of skin of other part of trunk: Secondary | ICD-10-CM | POA: Diagnosis not present

## 2016-06-23 DIAGNOSIS — L821 Other seborrheic keratosis: Secondary | ICD-10-CM | POA: Diagnosis not present

## 2016-07-12 DIAGNOSIS — C4402 Squamous cell carcinoma of skin of lip: Secondary | ICD-10-CM | POA: Diagnosis not present

## 2016-08-01 ENCOUNTER — Ambulatory Visit (INDEPENDENT_AMBULATORY_CARE_PROVIDER_SITE_OTHER): Payer: PPO | Admitting: Ophthalmology

## 2016-08-01 DIAGNOSIS — E113593 Type 2 diabetes mellitus with proliferative diabetic retinopathy without macular edema, bilateral: Secondary | ICD-10-CM

## 2016-08-01 DIAGNOSIS — H35033 Hypertensive retinopathy, bilateral: Secondary | ICD-10-CM | POA: Diagnosis not present

## 2016-08-01 DIAGNOSIS — E11319 Type 2 diabetes mellitus with unspecified diabetic retinopathy without macular edema: Secondary | ICD-10-CM | POA: Diagnosis not present

## 2016-08-01 DIAGNOSIS — I10 Essential (primary) hypertension: Secondary | ICD-10-CM | POA: Diagnosis not present

## 2016-08-07 DIAGNOSIS — D045 Carcinoma in situ of skin of trunk: Secondary | ICD-10-CM | POA: Diagnosis not present

## 2016-08-21 DIAGNOSIS — Z79899 Other long term (current) drug therapy: Secondary | ICD-10-CM | POA: Diagnosis not present

## 2016-08-21 DIAGNOSIS — E785 Hyperlipidemia, unspecified: Secondary | ICD-10-CM | POA: Diagnosis not present

## 2016-08-21 DIAGNOSIS — E1165 Type 2 diabetes mellitus with hyperglycemia: Secondary | ICD-10-CM | POA: Diagnosis not present

## 2016-08-21 DIAGNOSIS — Z0000 Encounter for general adult medical examination without abnormal findings: Secondary | ICD-10-CM | POA: Diagnosis not present

## 2016-08-21 DIAGNOSIS — Z6824 Body mass index (BMI) 24.0-24.9, adult: Secondary | ICD-10-CM | POA: Diagnosis not present

## 2016-08-21 DIAGNOSIS — D519 Vitamin B12 deficiency anemia, unspecified: Secondary | ICD-10-CM | POA: Diagnosis not present

## 2016-09-07 DIAGNOSIS — N400 Enlarged prostate without lower urinary tract symptoms: Secondary | ICD-10-CM | POA: Diagnosis not present

## 2016-09-07 DIAGNOSIS — Z87442 Personal history of urinary calculi: Secondary | ICD-10-CM | POA: Diagnosis not present

## 2016-09-11 ENCOUNTER — Other Ambulatory Visit: Payer: Self-pay | Admitting: Cardiovascular Disease

## 2016-09-15 ENCOUNTER — Encounter (HOSPITAL_COMMUNITY): Payer: Self-pay | Admitting: Cardiovascular Disease

## 2016-10-10 ENCOUNTER — Ambulatory Visit: Payer: PPO | Admitting: Cardiovascular Disease

## 2016-10-22 NOTE — Progress Notes (Signed)
Cardiology Office Note   Date:  10/23/2016   ID:  [name], DOB 1940/10/17, MRN [id number]  PCP:  Ricardo Sheriff, Le  Cardiologist:  Ricardo Le  No chief complaint on file.     History of Present Illness: Ricardo Le is a 76 y.o. male who presents for a follow up visit regarding  CAD and PAD. He has known history of coronary artery disease status post coronary artery bypass grafting in 2009 . He presented in 02/2015 with chest pain. Cardiac cath showed patent grafts.   History of pancreatic cancer 6 years ago which was treated successfully. He also reports history of retinal problems. He was told by his ophthalmologist that he should avoid blood thinners due to risk of bleeding.  He has history of intolerance to statins due to myalgia.   He is status post left common iliac artery stent placement in March 2016. He is known to have bilateral SFA occlusion with 1 vessel runoff below the knee bilaterally.  he continued to have neuropathic pain and ultimately underwent neck surgery in June of this year with improvement in symptoms.  ABI 09/2015 .67 right and .61 on left Follows with Ricardo Le  He is known to have mild bilateral carotid disease and will be getting carotid Doppler next month.  He has been doing well overall with stable mild bilateral calf claudication. No chest pain or shortness of breath. He does not feel limited by his claudication.    Past Medical History:  Diagnosis Date  . CAD (coronary artery disease)    a. 04/2007 s/p CABG x 4  - LIMA->LAD, VG->OM1->Om2, VG->PDA;  b. 12/2008 & 06/2010 Caths - Native multivessel dzs with 4/4 patent grafts. c. cath 03/09/2015 patent grafts, unchanged from prior cath.  . Cancer [facility])    pancreatic  March 2011  . Carotid bruit    a. 07/2010 U/S- 0-39% bilat ICA stenosis  . Chest pain    Noncardiac probably related to reflux  . DDD (degenerative disc disease)    several surgeries  . GERD (gastroesophageal reflux  disease)   . HNP (herniated nucleus pulposus), lumbar    a. L2-3, s/p laminotomy, microdiskectomy 02/2009  . HOH (hard of hearing)    uses amplifiers   . Nephrolithiasis   . PAD (peripheral artery disease) ([place]) 04/2014   s/p L Common Iliac Stent  . Palpitations   . 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 Pneumonia    hx of  pna  . Pure hypercholesterolemia    statin intolerance  . Type II or unspecified type diabetes mellitus without mention of complication, not stated as uncontrolled    insulin dependent  . Unspecified essential hypertension     Past Surgical History:  Procedure Laterality Date  . ABDOMINAL AORTAGRAM N/A 05/13/2014   Procedure: ABDOMINAL Ricardo Le;  Surgeon: Ricardo Le;  Location: [place] CATH LAB;  Service: Cardiovascular;  Laterality: N/A;  . ANTERIOR CERVICAL DECOMP/DISCECTOMY FUSION N/A 07/28/2014   Procedure: ANTERIOR CERVICAL DECOMPRESSION/DISCECTOMY FUSION CERVICAL FOUR-FIVE CERVICAL FIVE-SIX ;  Surgeon: Ricardo Larsson, Le;  Location: [place] NEURO ORS;  Service: Neurosurgery;  Laterality: N/A;  . APPENDECTOMY    . arthroscopy right knee    . BACK SURGERY    . CARDIAC CATHETERIZATION N/A 03/09/2015   Procedure: Left Heart Cath and Cors/Grafts Angiography;  Surgeon: Ricardo Sine, Le;  Location: [place] CV LAB;  Service:  Cardiovascular;  Laterality: N/A;  . CHOLECYSTECTOMY    . CORONARY ARTERY BYPASS GRAFT     X 4 2009 (LIMA to LAD, SVG to first cicumflex marginal branch with sequential SVG to second cicumflex marginal branch  and saphenous vein to posterior descending coronary artery with endoscopic,vein harvest rt. lower exttremity by RicardoOwen on March 31,2009  . CYST REMOVED     FROM SPINE  . CYSTOURETHROSCOPY     right retrograde pyelogram,manipulate stone in the renal pelvis, rt. double-j catheter.  [name] Kitchen EYE SURGERY    . KNEE SURGERY     rt knee  . NEPHROLITHOTOMY     PERCUTANEOUS  . PARS PLANA VITRECTOMY       lt. eye,retinal photocoagulation lt. eye, membrane peel lt. eye.  [name] Kitchen PERCUTANEOUS STENT INTERVENTION Left 05/13/2014   Procedure: PERCUTANEOUS STENT INTERVENTION;  Surgeon: Ricardo Le;  Location: [place] CATH LAB;  Service: Cardiovascular;  Laterality: Left;  COMMON ILLIAC  . re-exploratin of laminectomy  05/2008   (RT L2-3) WITH REDO MICRODISKECTOMY     Current Outpatient Prescriptions  Medication Sig Dispense Refill  . acetaminophen (TYLENOL) 325 MG tablet Take 650 mg by mouth every 6 (six) hours as needed (pain).    [name] Kitchen amLODipine (NORVASC) 10 MG tablet Take 10 mg by mouth daily.    [name] Kitchen aspirin EC 81 MG tablet Take 81 mg by mouth at bedtime.    [name] Kitchen atorvastatin (LIPITOR) 10 MG tablet TAKE ONE TABLET BY MOUTH DAILY 90 tablet 3  . dicyclomine (BENTYL) 20 MG tablet Take 20 mg by mouth 4 (four) times daily -  before meals and at bedtime.    [name] Kitchen glipiZIDE (GLUCOTROL) 10 MG tablet Take 10 mg by mouth 2 (two) times daily.     . insulin glargine (LANTUS SOLOSTAR) 100 UNIT/ML injection Inject 10 Units into the skin at bedtime.     [name] Kitchen lisinopril-hydrochlorothiazide (PRINZIDE,ZESTORETIC) 20-12.5 MG per tablet Take 1 tablet by mouth daily.      [name] Kitchen omeprazole (PRILOSEC) 20 MG capsule Take 20 mg by mouth daily.    . tamsulosin (FLOMAX) 0.4 MG CAPS capsule Take 0.4 mg by mouth daily after supper.    . terbinafine (LAMISIL) 250 MG tablet Take 250 mg by mouth daily.    . nitroGLYCERIN (NITROSTAT) 0.4 MG SL tablet Place 1 tablet (0.4 mg total) under the tongue every 5 (five) minutes as needed for chest pain. 25 tablet 3   No current facility-administered medications for this visit.     Allergies:   Rosuvastatin calcium; Iohexol; and Statins    Social History:  The patient  reports that he has never smoked. He has never used smokeless tobacco. He reports that he does not drink alcohol or use drugs.   Family History:  The patient's family history includes Coronary artery disease in his brother and sister;  Diabetes in his brother; Heart attack in his father; Other in his mother and unknown relative.    ROS:  Please see the history of present illness.   Otherwise, review of systems are positive for none.   All other systems are reviewed and negative.    PHYSICAL EXAM: VS:  BP 130/72   Pulse 62   Ht 5' 7.5" (1.715 m)   Wt 162 lb 6.4 oz (73.7 kg)   SpO2 96%   BMI 25.06 kg/m  , BMI Body mass index is 25.06 kg/m. GEN: Well nourished, well developed, in no acute distress  HEENT: normal  Neck: no JVD,  Bilateral carotid bruits, or masses Cardiac: RRR; no murmurs, rubs, or gallops,no edema  Respiratory:  clear to auscultation bilaterally, normal work of breathing GI: soft, nontender, nondistended, + BS MS: no deformity or atrophy  Skin: warm and dry, no rash Neuro:  Strength and sensation are intact Psych: euthymic mood, full affect Vascular: Femoral pulses are normal. Distal pulses are not palpable.  EKG:  10/23/16 SR rate 29 PVC old IMI LAD     Recent Labs: No results found for requested labs within last 8760 hours.    Lipid Panel    Component Value Date/Time   CHOL 164 03/09/2015 0220   TRIG 101 03/09/2015 0220   HDL 46 03/09/2015 0220   CHOLHDL 3.6 03/09/2015 0220   VLDL 20 03/09/2015 0220   LDLCALC 98 03/09/2015 0220      Wt Readings from Last 3 Encounters:  10/23/16 162 lb 6.4 oz (73.7 kg)  08/24/15 161 lb 6.4 oz (73.2 kg)  05/31/15 165 lb 6.4 oz (75 kg)         ASSESSMENT AND PLAN:  1.  Peripheral arterial disease: Status post left iliac artery stenting. He is known to have bilateral SFA occlusion with stable mild bilateral calf claudication. Continue medical therapy. ABI's in moderate .67-.61 range   2. Coronary artery disease involving native coronary arteries without angina: Cardiac catheterization in January 2017  showed patent grafts.  3. Hyperlipidemia: Reported intolerance to statins but hard to differentiate from claudication    4. Essential  hypertension: Blood pressure is mildly elevated but his blood pressure is usually more controlled. Continue same medications.  5. Bilateral carotid disease: Most recent duplex in January 2017  showed mild atherosclerosis.     Disposition:   FU with me in May and Ricardo Le in October with arterial and carotid studies  Signed, Jenkins Rouge, Le  10/23/2016 2:43 PM    [place]

## 2016-10-23 ENCOUNTER — Encounter: Payer: Self-pay | Admitting: Cardiovascular Disease

## 2016-10-23 ENCOUNTER — Ambulatory Visit (INDEPENDENT_AMBULATORY_CARE_PROVIDER_SITE_OTHER): Payer: PPO | Admitting: Cardiovascular Disease

## 2016-10-23 VITALS — BP 130/72 | HR 62 | Ht 67.5 in | Wt 162.4 lb

## 2016-10-23 DIAGNOSIS — E785 Hyperlipidemia, unspecified: Secondary | ICD-10-CM | POA: Diagnosis not present

## 2016-10-23 DIAGNOSIS — I739 Peripheral vascular disease, unspecified: Secondary | ICD-10-CM | POA: Diagnosis not present

## 2016-10-23 DIAGNOSIS — I25110 Atherosclerotic heart disease of native coronary artery with unstable angina pectoris: Secondary | ICD-10-CM | POA: Diagnosis not present

## 2016-10-23 MED ORDER — NITROGLYCERIN 0.4 MG SL SUBL: 0.4000 mg | SUBLINGUAL_TABLET | SUBLINGUAL | 3 refills | Status: DC | PRN

## 2016-10-23 NOTE — Patient Instructions (Addendum)
Medication Instructions:  Your physician has recommended you make the following change in your medication:  Take 1 NTG, under your tongue, while sitting. If no relief of pain may repeat NTG, one tab every 5 minutes up to 3 tablets total over 15 minutes. If no relief CALL 911. If you have dizziness/lightheadness while taking NTG, stop taking and call 911.  Labwork: NONE  Testing/Procedures: NONE  Follow-Up: Your physician wants you to follow-up in: 8 months with Dr. Johnsie Cancel. You will receive a reminder letter in the mail two months in advance. If you don't receive a letter, please call our office to schedule the follow-up appointment.   If you need a refill on your cardiac medications before your next appointment, please call your pharmacy.

## 2016-10-25 NOTE — Addendum Note (Signed)
Addended by: Mendel Ryder on: 10/25/2016 07:41 AM   Modules accepted: Orders

## 2016-10-31 ENCOUNTER — Other Ambulatory Visit: Payer: Self-pay | Admitting: Cardiovascular Disease

## 2016-10-31 DIAGNOSIS — I739 Peripheral vascular disease, unspecified: Secondary | ICD-10-CM

## 2016-11-14 ENCOUNTER — Ambulatory Visit (HOSPITAL_COMMUNITY)
Admission: RE | Admit: 2016-11-14 | Discharge: 2016-11-14 | Disposition: A | Discharge status: Home / Self Care | Payer: PPO | Source: Ambulatory Visit | Attending: Cardiovascular Disease | Admitting: Cardiovascular Disease

## 2016-11-14 ENCOUNTER — Ambulatory Visit (INDEPENDENT_AMBULATORY_CARE_PROVIDER_SITE_OTHER): Payer: PPO | Admitting: Cardiovascular Disease

## 2016-11-14 ENCOUNTER — Encounter: Payer: Self-pay | Admitting: Cardiovascular Disease

## 2016-11-14 VITALS — BP 144/64 | HR 56 | Ht 67.5 in | Wt 162.8 lb

## 2016-11-14 DIAGNOSIS — I739 Peripheral vascular disease, unspecified: Secondary | ICD-10-CM

## 2016-11-14 DIAGNOSIS — E785 Hyperlipidemia, unspecified: Secondary | ICD-10-CM

## 2016-11-14 DIAGNOSIS — I2510 Atherosclerotic heart disease of native coronary artery without angina pectoris: Secondary | ICD-10-CM

## 2016-11-14 DIAGNOSIS — I10 Essential (primary) hypertension: Secondary | ICD-10-CM | POA: Diagnosis not present

## 2016-11-14 DIAGNOSIS — Z9889 Other specified postprocedural states: Secondary | ICD-10-CM | POA: Diagnosis not present

## 2016-11-14 DIAGNOSIS — I7789 Other specified disorders of arteries and arterioles: Secondary | ICD-10-CM | POA: Insufficient documentation

## 2016-11-14 DIAGNOSIS — I700 Atherosclerosis of aorta: Secondary | ICD-10-CM | POA: Insufficient documentation

## 2016-11-14 DIAGNOSIS — I779 Disorder of arteries and arterioles, unspecified: Secondary | ICD-10-CM

## 2016-11-14 DIAGNOSIS — Z95828 Presence of other vascular implants and grafts: Secondary | ICD-10-CM | POA: Diagnosis not present

## 2016-11-14 NOTE — Progress Notes (Signed)
Cardiology Office Note   Date:  11/14/2016   ID:  Ricardo Le, DOB Sep 09, 1940, MRN [id number]  PCP:  Angelina Sheriff, MD  Cardiologist:  Dr. Johnsie Cancel  No chief complaint on file.     History of Present Illness: Ricardo Le is a 76 y.o. male who presents for a follow up visit regarding PAD. He has known history of coronary artery disease status post coronary artery bypass grafting in 2009 . He presented in 02/2015 with chest pain. Cardiac cath showed patent grafts.  Other past medical history include remote history of pancreatic cancer which was treated successfully. He is status post left common iliac artery stent placement in March 2016. He is known to have bilateral SFA occlusion with 1 vessel runoff below the knee bilaterally. He had previous neck surgery in 2016 with improvement in his neuropathic pain. He has been doing well and denies chest pain or shortness of breath. He reports minimal bilateral calf pain with walking. He has been tolerating atorvastatin without myalgia.   Past Medical History:  Diagnosis Date  . CAD (coronary artery disease)    a. 04/2007 s/p CABG x 4  - LIMA->LAD, VG->OM1->Om2, VG->PDA;  b. 12/2008 & 06/2010 Caths - Native multivessel dzs with 4/4 patent grafts. c. cath 03/09/2015 patent grafts, unchanged from prior cath.  . Cancer [facility])    pancreatic  March 2011  . Carotid bruit    a. 07/2010 U/S- 0-39% bilat ICA stenosis  . Chest pain    Noncardiac probably related to reflux  . DDD (degenerative disc disease)    several surgeries  . GERD (gastroesophageal reflux disease)   . HNP (herniated nucleus pulposus), lumbar    a. L2-3, s/p laminotomy, microdiskectomy 02/2009  . HOH (hard of hearing)    uses amplifiers   . Nephrolithiasis   . PAD (peripheral artery disease) ([place]) 04/2014   s/p L Common Iliac Stent  . Palpitations   . 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 Pneumonia    hx of  pna    . Pure hypercholesterolemia    statin intolerance  . Type II or unspecified type diabetes mellitus without mention of complication, not stated as uncontrolled    insulin dependent  . Unspecified essential hypertension     Past Surgical History:  Procedure Laterality Date  . ABDOMINAL AORTAGRAM N/A 05/13/2014   Procedure: ABDOMINAL [name];  Surgeon: Wellington Hampshire, MD;  Location: [place] CATH LAB;  Service: Cardiovascular;  Laterality: N/A;  . ANTERIOR CERVICAL DECOMP/DISCECTOMY FUSION N/A 07/28/2014   Procedure: ANTERIOR CERVICAL DECOMPRESSION/DISCECTOMY FUSION CERVICAL FOUR-FIVE CERVICAL FIVE-SIX ;  Surgeon: Earnie Larsson, MD;  Location: [place] NEURO ORS;  Service: Neurosurgery;  Laterality: N/A;  . APPENDECTOMY    . arthroscopy right knee    . BACK SURGERY    . CARDIAC CATHETERIZATION N/A 03/09/2015   Procedure: Left Heart Cath and Cors/Grafts Angiography;  Surgeon: [name] Sine, MD;  Location: [place] CV LAB;  Service: Cardiovascular;  Laterality: N/A;  . CHOLECYSTECTOMY    . CORONARY ARTERY BYPASS GRAFT     X 4 2009 (LIMA to LAD, SVG to first cicumflex marginal branch with sequential SVG to second cicumflex marginal branch  and saphenous vein to posterior descending coronary artery with endoscopic,vein harvest rt. lower exttremity by Dr.Owen on March 31,2009  . CYST REMOVED     FROM SPINE  . CYSTOURETHROSCOPY     right  retrograde pyelogram,manipulate stone in the renal pelvis, rt. double-j catheter.  [name] Kitchen EYE SURGERY    . KNEE SURGERY     rt knee  . NEPHROLITHOTOMY     PERCUTANEOUS  . PARS PLANA VITRECTOMY     lt. eye,retinal photocoagulation lt. eye, membrane peel lt. eye.  [name] Kitchen PERCUTANEOUS STENT INTERVENTION Left 05/13/2014   Procedure: PERCUTANEOUS STENT INTERVENTION;  Surgeon: Wellington Hampshire, MD;  Location: [place] CATH LAB;  Service: Cardiovascular;  Laterality: Left;  COMMON ILLIAC  . re-exploratin of laminectomy  05/2008   (RT L2-3) WITH REDO MICRODISKECTOMY     Current  Outpatient Prescriptions  Medication Sig Dispense Refill  . acetaminophen (TYLENOL) 325 MG tablet Take 650 mg by mouth every 6 (six) hours as needed (pain).    [name] Kitchen amLODipine (NORVASC) 10 MG tablet Take 10 mg by mouth daily.    [name] Kitchen aspirin EC 81 MG tablet Take 81 mg by mouth at bedtime.    [name] Kitchen atorvastatin (LIPITOR) 10 MG tablet TAKE ONE TABLET BY MOUTH DAILY 90 tablet 3  . glipiZIDE (GLUCOTROL) 10 MG tablet Take 10 mg by mouth 2 (two) times daily.     . insulin glargine (LANTUS SOLOSTAR) 100 UNIT/ML injection Inject 10 Units into the skin at bedtime.     [name] Kitchen lisinopril-hydrochlorothiazide (PRINZIDE,ZESTORETIC) 20-12.5 MG per tablet Take 1 tablet by mouth daily.      . nitroGLYCERIN (NITROSTAT) 0.4 MG SL tablet Place 1 tablet (0.4 mg total) under the tongue every 5 (five) minutes as needed for chest pain. 25 tablet 3  . omeprazole (PRILOSEC) 20 MG capsule Take 20 mg by mouth daily.    . tamsulosin (FLOMAX) 0.4 MG CAPS capsule Take 0.4 mg by mouth daily after supper.     No current facility-administered medications for this visit.     Allergies:   Rosuvastatin calcium; Iohexol; and Statins    Social History:  The patient  reports that he has never smoked. He has never used smokeless tobacco. He reports that he does not drink alcohol or use drugs.   Family History:  The patient's family history includes Coronary artery disease in his brother and sister; Diabetes in his brother; Heart attack in his father; Other in his mother and unknown relative.    ROS:  Please see the history of present illness.   Otherwise, review of systems are positive for none.   All other systems are reviewed and negative.    PHYSICAL EXAM: VS:  BP (!) 144/64   Pulse (!) 56   Ht 5' 7.5" (1.715 m)   Wt 162 lb 12.8 oz (73.8 kg)   SpO2 95%   BMI 25.12 kg/m  , BMI Body mass index is 25.12 kg/m. GEN: Well nourished, well developed, in no acute distress  HEENT: normal  Neck: no JVD, or masses. Bilateral carotid bruit.    Cardiac: RRR; no  rubs, or gallops,no edema . 1/6 SEM at the base.  Respiratory:  clear to auscultation bilaterally, normal work of breathing GI: soft, nontender, nondistended, + BS MS: no deformity or atrophy  Skin: warm and dry, no rash Neuro:  Strength and sensation are intact Psych: euthymic mood, full affect Vascular: Femoral pulses are normal. Distal pulses are not palpable.  EKG:  EKG is not ordered today.    Recent Labs: No results found for requested labs within last 8760 hours.    Lipid Panel    Component Value Date/Time   CHOL 164 03/09/2015 0220   TRIG 101 03/09/2015  0220   HDL 46 03/09/2015 0220   CHOLHDL 3.6 03/09/2015 0220   VLDL 20 03/09/2015 0220   LDLCALC 98 03/09/2015 0220      Wt Readings from Last 3 Encounters:  11/14/16 162 lb 12.8 oz (73.8 kg)  10/23/16 162 lb 6.4 oz (73.7 kg)  08/24/15 161 lb 6.4 oz (73.2 kg)         ASSESSMENT AND PLAN:  1.  Peripheral arterial disease: Status post left iliac artery stenting. He is known to have bilateral SFA occlusion .Minimal calf claudication at this time which is not lifestyle limiting. He had vascular studies done which showed stable ABI at 0.78 on the right and 0.81 on the left. Duplex showed patent left common iliac artery stent with mildly elevated velocity at 248. I recommend continuing medical therapy and repeat studies in one year.  2. Coronary artery disease involving native coronary arteries without angina:  Doing well with no anginal symptoms. Continue medical therapy.  3. Hyperlipidemia: Continue treatment with atorvastatin with a target LDL of less than 70.  4. Essential hypertension: Blood pressure is mildly elevated . Continue to monitor closely.  5. Bilateral carotid disease: Most recent duplex in early 2017 showed mild bilateral disease. He does have bilateral bruits and thus I requested a follow-up duplex to be done in 3 months from now.   Disposition:   FU with me in 1  year  Signed, Kathlyn [place], MD  11/14/2016 11:54 AM    [place]

## 2016-11-14 NOTE — Patient Instructions (Signed)
Medication Instructions: Your physician recommends that you continue on your current medications as directed. Please refer to the Current Medication list given to you today.  If you need a refill on your cardiac medications before your next appointment, please call your pharmacy.   Procedure: Your physician has requested that you have a carotid duplex in three months. This test is an ultrasound of the carotid arteries in your neck. It looks at blood flow through these arteries that supply the brain with blood. Allow one hour for this exam. There are no restrictions or special instructions.   Follow-Up: Your physician wants you to follow-up in 12 months with Dr. [name]. You will receive a reminder letter in the mail two months in advance. If you don't receive a letter, please call our office at [phone] to schedule this follow-up appointment.   Thank you for choosing [facility] at [facility]!!

## 2016-11-27 DIAGNOSIS — R1013 Epigastric pain: Secondary | ICD-10-CM | POA: Diagnosis not present

## 2016-11-27 DIAGNOSIS — Z6825 Body mass index (BMI) 25.0-25.9, adult: Secondary | ICD-10-CM | POA: Diagnosis not present

## 2016-11-27 DIAGNOSIS — Z23 Encounter for immunization: Secondary | ICD-10-CM | POA: Diagnosis not present

## 2016-11-27 DIAGNOSIS — Z1331 Encounter for screening for depression: Secondary | ICD-10-CM | POA: Diagnosis not present

## 2016-11-27 DIAGNOSIS — Z1339 Encounter for screening examination for other mental health and behavioral disorders: Secondary | ICD-10-CM | POA: Diagnosis not present

## 2016-11-27 DIAGNOSIS — N411 Chronic prostatitis: Secondary | ICD-10-CM | POA: Diagnosis not present

## 2016-12-04 DIAGNOSIS — Z1339 Encounter for screening examination for other mental health and behavioral disorders: Secondary | ICD-10-CM | POA: Diagnosis not present

## 2016-12-04 DIAGNOSIS — Z6825 Body mass index (BMI) 25.0-25.9, adult: Secondary | ICD-10-CM | POA: Diagnosis not present

## 2016-12-04 DIAGNOSIS — K5900 Constipation, unspecified: Secondary | ICD-10-CM | POA: Diagnosis not present

## 2016-12-04 DIAGNOSIS — K279 Peptic ulcer, site unspecified, unspecified as acute or chronic, without hemorrhage or perforation: Secondary | ICD-10-CM | POA: Diagnosis not present

## 2016-12-04 DIAGNOSIS — I10 Essential (primary) hypertension: Secondary | ICD-10-CM | POA: Diagnosis not present

## 2016-12-04 DIAGNOSIS — E1140 Type 2 diabetes mellitus with diabetic neuropathy, unspecified: Secondary | ICD-10-CM | POA: Diagnosis not present

## 2016-12-04 DIAGNOSIS — E7800 Pure hypercholesterolemia, unspecified: Secondary | ICD-10-CM | POA: Diagnosis not present

## 2017-01-02 DIAGNOSIS — R1013 Epigastric pain: Secondary | ICD-10-CM | POA: Diagnosis not present

## 2017-01-08 DIAGNOSIS — R1013 Epigastric pain: Secondary | ICD-10-CM | POA: Diagnosis not present

## 2017-01-26 DIAGNOSIS — Z6826 Body mass index (BMI) 26.0-26.9, adult: Secondary | ICD-10-CM | POA: Diagnosis not present

## 2017-01-26 DIAGNOSIS — N419 Inflammatory disease of prostate, unspecified: Secondary | ICD-10-CM | POA: Diagnosis not present

## 2017-01-26 DIAGNOSIS — I10 Essential (primary) hypertension: Secondary | ICD-10-CM | POA: Diagnosis not present

## 2017-01-31 ENCOUNTER — Ambulatory Visit (INDEPENDENT_AMBULATORY_CARE_PROVIDER_SITE_OTHER): Payer: PPO | Admitting: Ophthalmology

## 2017-01-31 DIAGNOSIS — E11319 Type 2 diabetes mellitus with unspecified diabetic retinopathy without macular edema: Secondary | ICD-10-CM | POA: Diagnosis not present

## 2017-01-31 DIAGNOSIS — I10 Essential (primary) hypertension: Secondary | ICD-10-CM | POA: Diagnosis not present

## 2017-01-31 DIAGNOSIS — H35033 Hypertensive retinopathy, bilateral: Secondary | ICD-10-CM | POA: Diagnosis not present

## 2017-01-31 DIAGNOSIS — E113593 Type 2 diabetes mellitus with proliferative diabetic retinopathy without macular edema, bilateral: Secondary | ICD-10-CM | POA: Diagnosis not present

## 2017-02-15 ENCOUNTER — Ambulatory Visit (HOSPITAL_COMMUNITY)
Admission: RE | Admit: 2017-02-15 | Discharge: 2017-02-15 | Disposition: A | Discharge status: Home / Self Care | Payer: PPO | Source: Ambulatory Visit | Attending: Cardiovascular Disease | Admitting: Cardiovascular Disease

## 2017-02-15 DIAGNOSIS — I739 Peripheral vascular disease, unspecified: Secondary | ICD-10-CM

## 2017-02-15 DIAGNOSIS — I779 Disorder of arteries and arterioles, unspecified: Secondary | ICD-10-CM

## 2017-02-19 ENCOUNTER — Telehealth: Payer: Self-pay | Admitting: Cardiovascular Disease

## 2017-02-19 ENCOUNTER — Emergency Department (HOSPITAL_COMMUNITY): Payer: PPO

## 2017-02-19 ENCOUNTER — Encounter (HOSPITAL_COMMUNITY): Payer: Self-pay | Admitting: Emergency Medicine

## 2017-02-19 ENCOUNTER — Other Ambulatory Visit: Payer: Self-pay

## 2017-02-19 ENCOUNTER — Inpatient Hospital Stay (HOSPITAL_COMMUNITY)
Admission: EM | Admit: 2017-02-19 | Discharge: 2017-02-21 | DRG: 287 | Disposition: A | Discharge status: Home / Self Care | Payer: PPO | Attending: Internal Medicine | Admitting: Internal Medicine

## 2017-02-19 DIAGNOSIS — I25110 Atherosclerotic heart disease of native coronary artery with unstable angina pectoris: Principal | ICD-10-CM | POA: Diagnosis present

## 2017-02-19 DIAGNOSIS — Z9081 Acquired absence of spleen: Secondary | ICD-10-CM

## 2017-02-19 DIAGNOSIS — E1351 Other specified diabetes mellitus with diabetic peripheral angiopathy without gangrene: Secondary | ICD-10-CM | POA: Diagnosis present

## 2017-02-19 DIAGNOSIS — Z90410 Acquired total absence of pancreas: Secondary | ICD-10-CM | POA: Diagnosis not present

## 2017-02-19 DIAGNOSIS — E891 Postprocedural hypoinsulinemia: Secondary | ICD-10-CM | POA: Diagnosis present

## 2017-02-19 DIAGNOSIS — Z833 Family history of diabetes mellitus: Secondary | ICD-10-CM | POA: Diagnosis not present

## 2017-02-19 DIAGNOSIS — K219 Gastro-esophageal reflux disease without esophagitis: Secondary | ICD-10-CM | POA: Diagnosis present

## 2017-02-19 DIAGNOSIS — E785 Hyperlipidemia, unspecified: Secondary | ICD-10-CM

## 2017-02-19 DIAGNOSIS — R0602 Shortness of breath: Secondary | ICD-10-CM | POA: Diagnosis not present

## 2017-02-19 DIAGNOSIS — Z8249 Family history of ischemic heart disease and other diseases of the circulatory system: Secondary | ICD-10-CM

## 2017-02-19 DIAGNOSIS — E119 Type 2 diabetes mellitus without complications: Secondary | ICD-10-CM | POA: Diagnosis present

## 2017-02-19 DIAGNOSIS — R079 Chest pain, unspecified: Secondary | ICD-10-CM | POA: Diagnosis not present

## 2017-02-19 DIAGNOSIS — Z794 Long term (current) use of insulin: Secondary | ICD-10-CM | POA: Diagnosis not present

## 2017-02-19 DIAGNOSIS — I472 Ventricular tachycardia: Secondary | ICD-10-CM | POA: Diagnosis not present

## 2017-02-19 DIAGNOSIS — E1159 Type 2 diabetes mellitus with other circulatory complications: Secondary | ICD-10-CM | POA: Diagnosis present

## 2017-02-19 DIAGNOSIS — I10 Essential (primary) hypertension: Secondary | ICD-10-CM

## 2017-02-19 DIAGNOSIS — I2510 Atherosclerotic heart disease of native coronary artery without angina pectoris: Secondary | ICD-10-CM

## 2017-02-19 DIAGNOSIS — Z7982 Long term (current) use of aspirin: Secondary | ICD-10-CM | POA: Diagnosis not present

## 2017-02-19 DIAGNOSIS — Z91041 Radiographic dye allergy status: Secondary | ICD-10-CM

## 2017-02-19 DIAGNOSIS — I6529 Occlusion and stenosis of unspecified carotid artery: Secondary | ICD-10-CM | POA: Diagnosis present

## 2017-02-19 DIAGNOSIS — R072 Precordial pain: Secondary | ICD-10-CM | POA: Diagnosis present

## 2017-02-19 DIAGNOSIS — Z888 Allergy status to other drugs, medicaments and biological substances status: Secondary | ICD-10-CM

## 2017-02-19 DIAGNOSIS — Z79899 Other long term (current) drug therapy: Secondary | ICD-10-CM | POA: Diagnosis not present

## 2017-02-19 DIAGNOSIS — I200 Unstable angina: Secondary | ICD-10-CM

## 2017-02-19 DIAGNOSIS — E7800 Pure hypercholesterolemia, unspecified: Secondary | ICD-10-CM | POA: Diagnosis present

## 2017-02-19 DIAGNOSIS — I739 Peripheral vascular disease, unspecified: Secondary | ICD-10-CM | POA: Diagnosis not present

## 2017-02-19 DIAGNOSIS — Z951 Presence of aortocoronary bypass graft: Secondary | ICD-10-CM | POA: Diagnosis not present

## 2017-02-19 DIAGNOSIS — I209 Angina pectoris, unspecified: Secondary | ICD-10-CM | POA: Diagnosis not present

## 2017-02-19 DIAGNOSIS — E782 Mixed hyperlipidemia: Secondary | ICD-10-CM | POA: Diagnosis not present

## 2017-02-19 DIAGNOSIS — E118 Type 2 diabetes mellitus with unspecified complications: Secondary | ICD-10-CM | POA: Diagnosis not present

## 2017-02-19 DIAGNOSIS — I25119 Atherosclerotic heart disease of native coronary artery with unspecified angina pectoris: Secondary | ICD-10-CM | POA: Diagnosis not present

## 2017-02-19 LAB — CBC
HCT: 42.9 % (ref 39.0–52.0)
Hemoglobin: 13.9 g/dL (ref 13.0–17.0)
MCH: 30.4 pg (ref 26.0–34.0)
MCHC: 32.4 g/dL (ref 30.0–36.0)
MCV: 93.9 fL (ref 78.0–100.0)
Platelets: 288 10*3/uL (ref 150–400)
RBC: 4.57 MIL/uL (ref 4.22–5.81)
RDW: 13.6 % (ref 11.5–15.5)
WBC: 11.6 10*3/uL — ABNORMAL HIGH (ref 4.0–10.5)

## 2017-02-19 LAB — BASIC METABOLIC PANEL
Anion gap: 10 (ref 5–15)
BUN: 21 mg/dL — ABNORMAL HIGH (ref 6–20)
CO2: 25 mmol/L (ref 22–32)
Calcium: 9.3 mg/dL (ref 8.9–10.3)
Chloride: 103 mmol/L (ref 101–111)
Creatinine, Ser: 0.95 mg/dL (ref 0.61–1.24)
GFR calc Af Amer: >60 mL/min (ref >60)
GFR calc non Af Amer: >60 mL/min (ref >60)
Glucose, Bld: 168 mg/dL — ABNORMAL HIGH (ref 65–99)
Potassium: 3.9 mmol/L (ref 3.5–5.1)
Sodium: 138 mmol/L (ref 135–145)

## 2017-02-19 LAB — MRSA PCR SCREENING: MRSA by PCR: POSITIVE — AB

## 2017-02-19 LAB — GLUCOSE, CAPILLARY
Glucose-Capillary: 202 mg/dL — ABNORMAL HIGH (ref 65–99)
Glucose-Capillary: 150 mg/dL — ABNORMAL HIGH (ref 65–99)

## 2017-02-19 LAB — TROPONIN I
Troponin I: <0.03 ng/mL (ref <0.03)
Troponin I: <0.03 ng/mL (ref <0.03)

## 2017-02-19 LAB — I-STAT TROPONIN, ED: Troponin i, poc: 0.01 ng/mL (ref 0.00–0.08)

## 2017-02-19 LAB — CBG MONITORING, ED: Glucose-Capillary: 71 mg/dL (ref 65–99)

## 2017-02-19 MED ORDER — INSULIN GLARGINE 100 UNIT/ML ~~LOC~~ SOLN
5.0000 [IU] | Freq: Every day | SUBCUTANEOUS | Status: DC
  Administered 2017-02-19 – 2017-02-20 (×2): 5 [IU] via SUBCUTANEOUS
  Filled 2017-02-19 (×3): qty 0.05

## 2017-02-19 MED ORDER — SODIUM CHLORIDE 0.9 % IV SOLN: 250.0000 mL | INTRAVENOUS | Status: DC | PRN

## 2017-02-19 MED ORDER — SODIUM CHLORIDE 0.9 % WEIGHT BASED INFUSION
3.0000 mL/kg/h | INTRAVENOUS | Status: DC
  Administered 2017-02-20: 3 mL/kg/h via INTRAVENOUS

## 2017-02-19 MED ORDER — ASPIRIN 81 MG PO CHEW
324.0000 mg | CHEWABLE_TABLET | Freq: Once | ORAL | Status: AC
  Administered 2017-02-19: 324 mg via ORAL
  Filled 2017-02-19: qty 4

## 2017-02-19 MED ORDER — PANTOPRAZOLE SODIUM 40 MG PO TBEC
40.0000 mg | DELAYED_RELEASE_TABLET | Freq: Every day | ORAL | Status: DC
  Administered 2017-02-20 – 2017-02-21 (×2): 40 mg via ORAL
  Filled 2017-02-19 (×2): qty 1

## 2017-02-19 MED ORDER — SODIUM CHLORIDE 0.9 % WEIGHT BASED INFUSION
1.0000 mL/kg/h | INTRAVENOUS | Status: DC
  Administered 2017-02-20: 1 mL/kg/h via INTRAVENOUS

## 2017-02-19 MED ORDER — SENNOSIDES-DOCUSATE SODIUM 8.6-50 MG PO TABS: 1.0000 | ORAL_TABLET | Freq: Every evening | ORAL | Status: DC | PRN

## 2017-02-19 MED ORDER — TAMSULOSIN HCL 0.4 MG PO CAPS: 0.4000 mg | ORAL_CAPSULE | Freq: Every day | ORAL | Status: DC

## 2017-02-19 MED ORDER — HYDROCHLOROTHIAZIDE 12.5 MG PO CAPS
12.5000 mg | ORAL_CAPSULE | Freq: Every day | ORAL | Status: DC
  Administered 2017-02-20 – 2017-02-21 (×2): 12.5 mg via ORAL
  Filled 2017-02-19 (×2): qty 1

## 2017-02-19 MED ORDER — ASPIRIN EC 81 MG PO TBEC
81.0000 mg | DELAYED_RELEASE_TABLET | Freq: Every day | ORAL | Status: DC
  Administered 2017-02-20: 81 mg via ORAL
  Filled 2017-02-19: qty 1

## 2017-02-19 MED ORDER — ACETAMINOPHEN 650 MG RE SUPP: 650.0000 mg | Freq: Four times a day (QID) | RECTAL | Status: DC | PRN

## 2017-02-19 MED ORDER — SODIUM CHLORIDE 0.9% FLUSH
3.0000 mL | Freq: Two times a day (BID) | INTRAVENOUS | Status: DC
  Administered 2017-02-19 – 2017-02-20 (×2): 3 mL via INTRAVENOUS

## 2017-02-19 MED ORDER — INSULIN GLARGINE 100 UNIT/ML ~~LOC~~ SOLN: 10.0000 [IU] | Freq: Every day | SUBCUTANEOUS | Status: DC

## 2017-02-19 MED ORDER — SODIUM CHLORIDE 0.9% FLUSH: 3.0000 mL | INTRAVENOUS | Status: DC | PRN

## 2017-02-19 MED ORDER — ASPIRIN 81 MG PO CHEW
81.0000 mg | CHEWABLE_TABLET | ORAL | Status: AC
  Administered 2017-02-20: 81 mg via ORAL
  Filled 2017-02-19: qty 1

## 2017-02-19 MED ORDER — INSULIN ASPART 100 UNIT/ML ~~LOC~~ SOLN
0.0000 [IU] | SUBCUTANEOUS | Status: DC
  Administered 2017-02-19: 3 [IU] via SUBCUTANEOUS
  Administered 2017-02-20: 2 [IU] via SUBCUTANEOUS
  Administered 2017-02-20: 3 [IU] via SUBCUTANEOUS
  Administered 2017-02-20 (×2): 1 [IU] via SUBCUTANEOUS
  Administered 2017-02-21: 2 [IU] via SUBCUTANEOUS
  Administered 2017-02-21: 3 [IU] via SUBCUTANEOUS
  Administered 2017-02-21: 7 [IU] via SUBCUTANEOUS
  Administered 2017-02-21: 3 [IU] via SUBCUTANEOUS

## 2017-02-19 MED ORDER — ATORVASTATIN CALCIUM 10 MG PO TABS
10.0000 mg | ORAL_TABLET | Freq: Every day | ORAL | Status: DC
  Administered 2017-02-20 – 2017-02-21 (×2): 10 mg via ORAL
  Filled 2017-02-19 (×2): qty 1

## 2017-02-19 MED ORDER — LISINOPRIL 20 MG PO TABS: 20.0000 mg | ORAL_TABLET | Freq: Every day | ORAL | Status: DC

## 2017-02-19 MED ORDER — ACETAMINOPHEN 325 MG PO TABS: 650.0000 mg | ORAL_TABLET | Freq: Four times a day (QID) | ORAL | Status: DC | PRN

## 2017-02-19 MED ORDER — LISINOPRIL-HYDROCHLOROTHIAZIDE 20-12.5 MG PO TABS: 1.0000 | ORAL_TABLET | Freq: Every day | ORAL | Status: DC

## 2017-02-19 MED ORDER — AMLODIPINE BESYLATE 10 MG PO TABS
10.0000 mg | ORAL_TABLET | Freq: Every day | ORAL | Status: DC
  Administered 2017-02-20 – 2017-02-21 (×2): 10 mg via ORAL
  Filled 2017-02-19 (×2): qty 1

## 2017-02-19 NOTE — ED Notes (Signed)
Ice chips given to pt.  

## 2017-02-19 NOTE — Telephone Encounter (Signed)
Pt c/o of Chest Pain: STAT if CP now or developed within 24 hours  1. Are you having CP right now?Yes   2. Are you experiencing any other symptoms (ex. SOB, nausea, vomiting, sweating)?Shortness of Breath , unable to walk far  Heart starts beating faster   3. How long have you been experiencing CP? 1week and half . Got Worse this weekend   4. Is your CP continuous or coming and going? Continuous   5. Have you taken Nitroglycerin?yes  ?

## 2017-02-19 NOTE — ED Notes (Signed)
Cardiology at bedside.

## 2017-02-19 NOTE — ED Triage Notes (Signed)
Pt reports chest pain for the past 2 weeks, this is made worse with exertion especially walking. This pain was worse today with walking and into left arm. Pt has bypass surgery 10 years ago.

## 2017-02-19 NOTE — ED Notes (Signed)
Cards team at bedside

## 2017-02-19 NOTE — ED Notes (Signed)
Patient transported to X-ray 

## 2017-02-19 NOTE — Progress Notes (Signed)
Positive MRSA called by labs at 2352. Isolation measures initiated.

## 2017-02-19 NOTE — Consult Note (Signed)
Cardiology Consultation:   Patient ID: Ricardo Le; [id number]; 10-22-1940   Admit date: 02/19/2017 Date of Consult: 02/19/2017  Primary Care Provider: Angelina Sheriff, MD Primary Cardiologist: Jenkins Rouge, MD / [name] for PAD Primary Electrophysiologist:     Patient Profile:   Ricardo Le is a 77 y.o. male with a hx of CAD s/p CABG x 4 (04/2007 LIMA to LAD, SVG OM1, OM2, SVG to PDA), last cath 03/09/15 with patent grafts and unchanged from prior cath, PAD s/p iliac artery stenting, HLD, HTN, bilateral carotid artery disease, DM, pancreatic cancer, and GERD who is being seen today for the evaluation of chest pain at the request of Dr. Laverta Baltimore.  History of Present Illness:   Ricardo Le is followed by this practice and last saw Dr. Johnsie Cancel on 10/23/16. At that time, he was in his usual state of health, no medication changes. Dr. [name] follows his PAD. CAD history includes CABG x 4 (04/2007 LIMA to LAD, SVG OM1, OM2, SVG to PDA), last cath 03/09/15 with patent grafts and unchanged from prior cath.   He called the office this morning with chest pain that is only briefly relieved with nitro. At the urging of Dr. Johnsie Cancel, he reported to the ED with worsening exertional chest pain and dyspnea. He states he has had worsening chest pain over the past 3 weeks that is relieved with rest. Nitro improves his chest pain. On my interview, he states that his main complaint is "heart pounding" and extreme fatigue when he does any activity. He has had a recent head cold, no bleeding, no syncope, no lower extremity swelling.   Past Medical History:  Diagnosis Date  . CAD (coronary artery disease)    a. 04/2007 s/p CABG x 4  - LIMA->LAD, VG->OM1->Om2, VG->PDA;  b. 12/2008 & 06/2010 Caths - Native multivessel dzs with 4/4 patent grafts. c. cath 03/09/2015 patent grafts, unchanged from prior cath.  . Cancer [facility])    pancreatic  March 2011  . Carotid bruit    a. 07/2010 U/S- 0-39% bilat ICA stenosis  . Chest  pain    Noncardiac probably related to reflux  . DDD (degenerative disc disease)    several surgeries  . GERD (gastroesophageal reflux disease)   . HNP (herniated nucleus pulposus), lumbar    a. L2-3, s/p laminotomy, microdiskectomy 02/2009  . HOH (hard of hearing)    uses amplifiers   . Nephrolithiasis   . PAD (peripheral artery disease) ([place]) 04/2014   s/p L Common Iliac Stent  . Palpitations   . 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 Pneumonia    hx of  pna  . Pure hypercholesterolemia    statin intolerance  . Type II or unspecified type diabetes mellitus without mention of complication, not stated as uncontrolled    insulin dependent  . Unspecified essential hypertension     Past Surgical History:  Procedure Laterality Date  . ABDOMINAL AORTAGRAM N/A 05/13/2014   Procedure: ABDOMINAL [name];  Surgeon: Wellington Hampshire, MD;  Location: [place] CATH LAB;  Service: Cardiovascular;  Laterality: N/A;  . ANTERIOR CERVICAL DECOMP/DISCECTOMY FUSION N/A 07/28/2014   Procedure: ANTERIOR CERVICAL DECOMPRESSION/DISCECTOMY FUSION CERVICAL FOUR-FIVE CERVICAL FIVE-SIX ;  Surgeon: Earnie Larsson, MD;  Location: [place] NEURO ORS;  Service: Neurosurgery;  Laterality: N/A;  . APPENDECTOMY    . arthroscopy right knee    . BACK SURGERY    . CARDIAC CATHETERIZATION N/A 03/09/2015  Procedure: Left Heart Cath and Cors/Grafts Angiography;  Surgeon: [name] Sine, MD;  Location: [place] CV LAB;  Service: Cardiovascular;  Laterality: N/A;  . CHOLECYSTECTOMY    . CORONARY ARTERY BYPASS GRAFT     X 4 2009 (LIMA to LAD, SVG to first cicumflex marginal branch with sequential SVG to second cicumflex marginal branch  and saphenous vein to posterior descending coronary artery with endoscopic,vein harvest rt. lower exttremity by Dr.Owen on March 31,2009  . CYST REMOVED     FROM SPINE  . CYSTOURETHROSCOPY     right retrograde pyelogram,manipulate stone in the renal pelvis,  rt. double-j catheter.  [name] Kitchen EYE SURGERY    . KNEE SURGERY     rt knee  . NEPHROLITHOTOMY     PERCUTANEOUS  . PARS PLANA VITRECTOMY     lt. eye,retinal photocoagulation lt. eye, membrane peel lt. eye.  [name] Kitchen PERCUTANEOUS STENT INTERVENTION Left 05/13/2014   Procedure: PERCUTANEOUS STENT INTERVENTION;  Surgeon: Wellington Hampshire, MD;  Location: [place] CATH LAB;  Service: Cardiovascular;  Laterality: Left;  COMMON ILLIAC  . re-exploratin of laminectomy  05/2008   (RT L2-3) WITH REDO MICRODISKECTOMY     Home Medications:  Prior to Admission medications   Medication Sig Start Date End Date Taking? Authorizing Provider  acetaminophen (TYLENOL) 325 MG tablet Take 650 mg by mouth every 6 (six) hours as needed (pain).    [provider]  amLODipine (NORVASC) 10 MG tablet Take 10 mg by mouth daily. 08/16/16   [provider]  aspirin EC 81 MG tablet Take 81 mg by mouth at bedtime.    [provider]  atorvastatin (LIPITOR) 10 MG tablet TAKE ONE TABLET BY MOUTH DAILY 09/11/16   Wellington Hampshire, MD  glipiZIDE (GLUCOTROL) 10 MG tablet Take 10 mg by mouth 2 (two) times daily.     [provider]  insulin glargine (LANTUS SOLOSTAR) 100 UNIT/ML injection Inject 10 Units into the skin at bedtime.     [provider]  lisinopril-hydrochlorothiazide (PRINZIDE,ZESTORETIC) 20-12.5 MG per tablet Take 1 tablet by mouth daily.      [provider]  nitroGLYCERIN (NITROSTAT) 0.4 MG SL tablet Place 1 tablet (0.4 mg total) under the tongue every 5 (five) minutes as needed for chest pain. 10/23/16 10/22/17  Josue Hector, MD  omeprazole (PRILOSEC) 20 MG capsule Take 20 mg by mouth daily.    [provider]  tamsulosin (FLOMAX) 0.4 MG CAPS capsule Take 0.4 mg by mouth daily after supper.    [provider]    Inpatient Medications: Scheduled Meds:  Continuous Infusions:  PRN Meds:   Allergies:    Allergies  Allergen Reactions  . Rosuvastatin Calcium  Other (See Comments)    Myalgias  . Iohexol Hives    pt ok with premedication (Benadryl 50mg  PO), Onset Date: [id number]   . Statins Other (See Comments)    Myalgias     Social History:   Social History   Socioeconomic History  . Marital status: Married    Spouse name: Not on file  . Number of children: Not on file  . Years of education: Not on file  . Highest education level: Not on file  Social Needs  . Financial resource strain: Not on file  . Food insecurity - worry: Not on file  . Food insecurity - inability: Not on file  . Transportation needs - medical: Not on file  . Transportation needs - non-medical: Not on file  Occupational  History  . Not on file  Tobacco Use  . Smoking status: Never Smoker  . Smokeless tobacco: Never Used  Substance and Sexual Activity  . Alcohol use: No  . Drug use: No  . Sexual activity: Not Currently  Other Topics Concern  . Not on file  Social History Narrative   Lives in [place] with wife.  Retired Company secretary.  Walks 1.13mi daily.  Does not work 2/2 back problems.    Family History:    Family History  Problem Relation Age of Onset  . Heart attack Father        died of MI @ 38  . Diabetes Brother        type 2  . Other Mother        died @ 33  . Coronary artery disease Sister        alive  . Other Unknown        no fh of colon cancer  . Coronary artery disease Brother        s/p CABG.  Alive @ 62     ROS:  Please see the history of present illness.  ROS  All other ROS reviewed and negative.     Physical Exam/Data:   Vitals:   02/19/17 1245 02/19/17 1300 02/19/17 1315 02/19/17 1330  BP: (!) 138/57 (!) 152/65 (!) 160/60 (!) 147/60  Pulse: (!) 54 (!) 56 (!) 59 (!) 55  Resp: 15 18 (!) 21 17  Temp:      TempSrc:      SpO2: 92% 95% 95% 96%  Weight:      Height:       No intake or output data in the 24 hours ending 02/19/17 1349 Filed Weights   02/19/17 1051  Weight: 160 lb (72.6 kg)   Body mass index is 25.06  kg/m.  General:  Well nourished, well developed, in no acute distress HEENT: normal Lymph: no adenopathy Neck: no JVD Endocrine:  No thryomegaly Vascular: No carotid bruits; FA pulses 2+ bilaterally without bruits  Cardiac:  normal S1, S2; RRR; + murmur Lungs:  clear to auscultation bilaterally, no wheezing, rhonchi or rales  Abd: soft, nontender, no hepatomegaly  Ext: no edema Musculoskeletal:  No deformities, BUE and BLE strength normal and equal Skin: warm and dry  Neuro:  CNs 2-12 intact, no focal abnormalities noted Psych:  Normal affect   EKG:  The EKG was personally reviewed and demonstrates:  sinus Telemetry:  Telemetry was personally reviewed and demonstrates:  sinus  Relevant CV Studies:  Left heart cath 03/09/15:  LM lesion, 70% stenosed.  Prox LAD lesion, 50% stenosed.  2nd Diag lesion, 70% stenosed.  Mid LAD lesion, 70% stenosed.  Dist LAD lesion, 100% stenosed.  Prox Cx lesion, 80% stenosed.  Mid Cx lesion, 80% stenosed.  Prox RCA lesion, 100% stenosed.  Dist Cx lesion, 95% stenosed.  SVG was injected is normal in caliber, and is anatomically normal.  Post Atrio lesion, 80% stenosed.  LIMA was injected is normal in caliber, and is anatomically normal.  SVG was injected is normal in caliber, and is anatomically normal.  The left ventricular systolic function is normal.   Normal LV function without focal segmental wall motion abnormalities.  Ejection fraction 55-60%.  Significant multivessel  native coronary obstructive disease with evidence for coronary calcification and evidence for 70% distal left main stenosis; diffuse 50% proximal LAD stenosis with diffuse 75% diagonal-2 stenosis,  70% stenosis after the second diagonal vessel in  the region of a prominent septal perforating artery with total occlusion of the mid LAD; diffuse 80% proximal, mid and 95% mid- distal left circumflex stenosis; and total occlusion of the proximal RCA.  Patent LIMA  graft supplying the mid LAD with collateralization from the apical LAD to the PLA branch of the RCA.  Patent sequential vein graft supplying the OM1 and OM 2 vessels of the left circumflex coronary artery.  Patent saphenous vein graft supplying the mid PDA vessel with evidence for 80% stenosis in the continuation branch of the RCA after the PDA takeoff in a dominant RCA system.  RECOMMENDATION: Increased medical therapy.  All grafts remain patent.  Several vascular distributions including the proximal LAD diagonal system which is not supplied by the LIMA graft and proximal circumflex may be responsible for potential ischemia.   Laboratory Data:  Chemistry Recent Labs  Lab 02/19/17 1102  NA 138  K 3.9  CL 103  CO2 25  GLUCOSE 168*  BUN 21*  CREATININE 0.95  CALCIUM 9.3  GFRNONAA >60  GFRAA >60  ANIONGAP 10    No results for input(s): PROT, ALBUMIN, AST, ALT, ALKPHOS, BILITOT in the last 168 hours. Hematology Recent Labs  Lab 02/19/17 1102  WBC 11.6*  RBC 4.57  HGB 13.9  HCT 42.9  MCV 93.9  MCH 30.4  MCHC 32.4  RDW 13.6  PLT 288   Cardiac EnzymesNo results for input(s): TROPONINI in the last 168 hours.  Recent Labs  Lab 02/19/17 1112  TROPIPOC 0.01    BNPNo results for input(s): BNP, PROBNP in the last 168 hours.  DDimer No results for input(s): DDIMER in the last 168 hours.  Radiology/Studies:  Dg Chest 2 View  Result Date: 02/19/2017 CLINICAL DATA:  Chest pain and shortness of breath. EXAM: CHEST  2 VIEW COMPARISON:  Chest x-ray dated March 08, 2015. FINDINGS: The cardiomediastinal silhouette is normal in size. Prior CABG. Normal pulmonary vascularity. Low lung volumes with bronchovascular crowding and bibasilar atelectasis. No focal consolidation, pleural effusion, or pneumothorax. No acute osseous abnormality. IMPRESSION: Low lung volumes with bibasilar atelectasis. Electronically Signed   By: Titus Dubin M.D.   On: 02/19/2017 11:43    Assessment  and Plan:   1. Chest pain, known CAD, s/p CABG x 4 2009, last cath 02/2015 with patent grafts - initial troponin negative - EKG without signs of acute ischemia - Pt has known disease and describes "heart pounding" and extreme fatigue with activity over the past 3 weeks. His story is inconsistent when I ask him about chest pain/pressure/radiation.  - given negative troponins and unchanged EKG, will consider stress test vs heart catheterization   2. HTN - continue home meds: norvasc, prinzide   3. HLD - intolerant to statins, but also has claudication - on 10 mg lipitor    For questions or updates, please contact [facility] Please consult [email or web address] for contact info under Cardiology/STEMI.   Signed, Tami Lin Victoire Deans, PA  02/19/2017 1:49 PM

## 2017-02-19 NOTE — ED Notes (Signed)
CBG taken at 16:44 was 71.

## 2017-02-19 NOTE — ED Provider Notes (Signed)
Emergency Department Provider Note   I have reviewed the triage vital signs and the nursing notes.   HISTORY  Chief Complaint Chest Pain   HPI Ricardo Le is a 77 y.o. male with PMH of CAD s/p CABG 2009, DDD, GERD, PAD, DM, HTN, and HLD resents to the emergency department for evaluation of worsening exertional chest pain and dyspnea.  Patient states that he has had the symptoms for 2 weeks but over the weekend they have become significantly worse.  If he is at rest he is not having any pain or shortness of breath.  As soon as he begins to walk he feels very winded "like I'm running" and gets a central chest pressure/pain that resolve with rest. Took nitro for chest pain which also improved symptoms. Patient states that he has had heart catheterizations in the past which did not show anything acute and so this time he wanted to make sure for presenting to the emergency department.  Patient states he spoke with Dr. Johnsie Cancel who told him to present to the ED and, according to the patient, to get a heart cath.  Denies pain or shortness of breath at this time.  No radiation of symptoms.  No fevers or chills.  No productive cough.   Primary Cardiologist: Dr. Johnsie Cancel.  Last cath: 2017 patent grafts     Past Medical History:  Diagnosis Date  . CAD (coronary artery disease)    a. 04/2007 s/p CABG x 4  - LIMA->LAD, VG->OM1->Om2, VG->PDA;  b. 12/2008 & 06/2010 Caths - Native multivessel dzs with 4/4 patent grafts. c. cath 03/09/2015 patent grafts, unchanged from prior cath.  . Cancer [facility])    pancreatic  March 2011  . Carotid bruit    a. 07/2010 U/S- 0-39% bilat ICA stenosis  . Chest pain    Noncardiac probably related to reflux  . DDD (degenerative disc disease)    several surgeries  . GERD (gastroesophageal reflux disease)   . HNP (herniated nucleus pulposus), lumbar    a. L2-3, s/p laminotomy, microdiskectomy 02/2009  . HOH (hard of hearing)    uses amplifiers   . Nephrolithiasis   .  PAD (peripheral artery disease) ([place]) 04/2014   s/p L Common Iliac Stent  . Palpitations   . 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 Pneumonia    hx of  pna  . Pure hypercholesterolemia    statin intolerance  . Type II or unspecified type diabetes mellitus without mention of complication, not stated as uncontrolled    insulin dependent  . Unspecified essential hypertension     Patient Active Problem List   Diagnosis Date Noted  . Atherosclerotic heart disease of native coronary artery with unstable angina pectoris ([place]) 03/08/2015  . Hyperlipidemia 01/26/2015  . Cervical spondylosis without myelopathy 07/28/2014  . Cervical spondylosis 07/28/2014  . Constipation 05/19/2014  . Pain 05/18/2014  . PAD (peripheral artery disease) ([place]) 03/20/2012  . Unstable angina ([place]) 05/05/2011  . DDD (degenerative disc disease)   . HNP (herniated nucleus pulposus), lumbar   . Pancreatic mass   . Nephrolithiasis   . Carotid bruit   . S/P CABG x 4 03/08/2011  . Bruit 07/06/2010  . NONSPECIFIC ABN FINDING RAD & OTH EXAM GI TRACT 06/09/2009  . NAUSEA ALONE 05/28/2009  . CHANGE IN BOWELS 05/28/2009  . Elevated lipids 05/27/2009  . ABDOMINAL PAIN, GENERALIZED 05/27/2009  . CONSTIPATION 05/10/2009  . Type  2 diabetes mellitus with vascular disease ([place]) 12/16/2008  . Essential hypertension 12/16/2008  . BACK PAIN 12/16/2008  . Palpitations 12/16/2008    Past Surgical History:  Procedure Laterality Date  . ABDOMINAL AORTAGRAM N/A 05/13/2014   Procedure: ABDOMINAL [name];  Surgeon: Wellington Hampshire, MD;  Location: [place] CATH LAB;  Service: Cardiovascular;  Laterality: N/A;  . ANTERIOR CERVICAL DECOMP/DISCECTOMY FUSION N/A 07/28/2014   Procedure: ANTERIOR CERVICAL DECOMPRESSION/DISCECTOMY FUSION CERVICAL FOUR-FIVE CERVICAL FIVE-SIX ;  Surgeon: Earnie Larsson, MD;  Location: [place] NEURO ORS;  Service: Neurosurgery;  Laterality: N/A;  . APPENDECTOMY    .  arthroscopy right knee    . BACK SURGERY    . CARDIAC CATHETERIZATION N/A 03/09/2015   Procedure: Left Heart Cath and Cors/Grafts Angiography;  Surgeon: [name] Sine, MD;  Location: [place] CV LAB;  Service: Cardiovascular;  Laterality: N/A;  . CHOLECYSTECTOMY    . CORONARY ARTERY BYPASS GRAFT     X 4 2009 (LIMA to LAD, SVG to first cicumflex marginal branch with sequential SVG to second cicumflex marginal branch  and saphenous vein to posterior descending coronary artery with endoscopic,vein harvest rt. lower exttremity by Dr.Owen on March 31,2009  . CYST REMOVED     FROM SPINE  . CYSTOURETHROSCOPY     right retrograde pyelogram,manipulate stone in the renal pelvis, rt. double-j catheter.  [name] Kitchen EYE SURGERY    . KNEE SURGERY     rt knee  . NEPHROLITHOTOMY     PERCUTANEOUS  . PARS PLANA VITRECTOMY     lt. eye,retinal photocoagulation lt. eye, membrane peel lt. eye.  [name] Kitchen PERCUTANEOUS STENT INTERVENTION Left 05/13/2014   Procedure: PERCUTANEOUS STENT INTERVENTION;  Surgeon: Wellington Hampshire, MD;  Location: [place] CATH LAB;  Service: Cardiovascular;  Laterality: Left;  COMMON ILLIAC  . re-exploratin of laminectomy  05/2008   (RT L2-3) WITH REDO MICRODISKECTOMY    Current Outpatient Rx  . Order #: [id number] Class: Historical Med  . Order #: [id number] Class: Historical Med  . Order #: [id number] Class: Historical Med  . Order #: [id number] Class: Normal  . Order #: [id number] Class: Historical Med  . Order #: [id number] Class: Historical Med  . Order #: [id number] Class: Historical Med  . Order #: [id number] Class: Normal  . Order #: [id number] Class: Historical Med  . Order #: [id number] Class: Historical Med    Allergies Rosuvastatin calcium; Iohexol; and Statins  Family History  Problem Relation Age of Onset  . Heart attack Father        died of MI @ 23  . Diabetes Brother        type 2  . Other Mother        died @ 64  . Coronary artery disease Sister        alive  . Other Unknown        no fh  of colon cancer  . Coronary artery disease Brother        s/p CABG.  Alive @ 69    Social History Social History   Tobacco Use  . Smoking status: Never Smoker  . Smokeless tobacco: Never Used  Substance Use Topics  . Alcohol use: No  . Drug use: No    Review of Systems  Constitutional: No fever/chills Eyes: No visual changes. ENT: No sore throat. Cardiovascular: Positive exertional chest pain. Respiratory: Positive exertional shortness of breath. Gastrointestinal: No abdominal pain.  No nausea, no vomiting.  No diarrhea.  No constipation. Genitourinary: Negative for dysuria. Musculoskeletal: Negative for back pain. Skin: Negative for rash.  Neurological: Negative for headaches, focal weakness or numbness.  10-point ROS otherwise negative.  ____________________________________________   PHYSICAL EXAM:  VITAL SIGNS: ED Triage Vitals  Enc Vitals Group     BP 02/19/17 1056 (!) 144/65     Pulse Rate 02/19/17 1056 62     Resp 02/19/17 1056 18     Temp 02/19/17 1051 (!) 97.4 F (36.3 C)     Temp Source 02/19/17 1051 Oral     SpO2 02/19/17 1056 96 %     Weight 02/19/17 1051 160 lb (72.6 kg)     Height 02/19/17 1051 5\' 7"  (1.702 m)     Pain Score 02/19/17 1050 3    Constitutional: Alert and oriented. Well appearing and in no acute distress. Eyes: Conjunctivae are normal. Head: Atraumatic. Nose: No congestion/rhinnorhea. Mouth/Throat: Mucous membranes are moist. Neck: No stridor.   Cardiovascular: Normal rate, regular rhythm. Good peripheral circulation. Grossly normal heart sounds.   Respiratory: Normal respiratory effort.  No retractions. Lungs CTAB. Gastrointestinal: Soft and nontender. No distention.  Musculoskeletal: No lower extremity tenderness nor edema. No gross deformities of extremities. Neurologic:  Normal speech and language. No gross focal neurologic deficits are appreciated.  Skin:  Skin is warm, dry and intact. No rash  noted.  ____________________________________________   LABS (all labs ordered are listed, but only abnormal results are displayed)  Labs Reviewed  BASIC METABOLIC PANEL - Abnormal; Notable for the following components:      Result Value   Glucose, Bld 168 (*)    BUN 21 (*)    All other components within normal limits  CBC - Abnormal; Notable for the following components:   WBC 11.6 (*)    All other components within normal limits  I-STAT TROPONIN, ED   ____________________________________________  EKG   EKG Interpretation  Date/Time:  Monday February 19 2017 10:52:53 EST Ventricular Rate:  64 PR Interval:  168 QRS Duration: 104 QT Interval:  422 QTC Calculation: 435 R Axis:   -89 Text Interpretation:  Normal sinus rhythm Left anterior fascicular block Abnormal ECG No STEMI.  Similar to 2017 tracing.  Confirmed by Nanda Quinton [phone]) on 02/19/2017 11:13:41 AM       ____________________________________________  RADIOLOGY  Dg Chest 2 View  Result Date: 02/19/2017 CLINICAL DATA:  Chest pain and shortness of breath. EXAM: CHEST  2 VIEW COMPARISON:  Chest x-ray dated March 08, 2015. FINDINGS: The cardiomediastinal silhouette is normal in size. Prior CABG. Normal pulmonary vascularity. Low lung volumes with bronchovascular crowding and bibasilar atelectasis. No focal consolidation, pleural effusion, or pneumothorax. No acute osseous abnormality. IMPRESSION: Low lung volumes with bibasilar atelectasis. Electronically Signed   By: Titus Dubin M.D.   On: 02/19/2017 11:43    ____________________________________________   PROCEDURES  Procedure(s) performed:   Procedures  None ____________________________________________   INITIAL IMPRESSION / ASSESSMENT AND PLAN / ED COURSE  Pertinent labs & imaging results that were available during my care of the patient were reviewed by me and considered in my medical decision making (see chart for details).  Patient presents  to the emergency department for evaluation of exertional chest pain and dyspnea that is been ongoing for the past 2 weeks but worsening over the weekend.  No active symptoms the patient is at rest.  He has significant risk factors with CABG. Last cath 2017. Plan for ASA here with labs and CXR.  Differential includes all life-threatening causes for chest pain. This includes but is not exclusive to acute coronary syndrome,  aortic dissection, pulmonary embolism, cardiac tamponade, community-acquired pneumonia, pericarditis, musculoskeletal chest wall pain, etc.  12:35 PM Spoke with Trish from Cardiology. She will assign a Cardiologist to consult on the case.   Discussed patient's case with IM teaching service to request admission. Patient and family (if present) updated with plan. Care transferred to IM service.  I reviewed all nursing notes, vitals, pertinent old records, EKGs, labs, imaging (as available).   ____________________________________________  FINAL CLINICAL IMPRESSION(S) / ED DIAGNOSES  Final diagnoses:  Precordial chest pain     MEDICATIONS GIVEN DURING THIS VISIT:  Medications  aspirin chewable tablet 324 mg (324 mg Oral Given 02/19/17 1204)     Note:  This document was prepared using Dragon voice recognition software and may include unintentional dictation errors.  Nanda Quinton, MD Emergency Medicine    Long, [facility], MD 02/19/17 [phone]

## 2017-02-19 NOTE — H&P (Signed)
Date: 02/19/2017               Patient Name:  Ricardo Le MRN: [id number]  DOB: 1940/12/07 Age / Sex: 77 y.o., male   PCP: Angelina Sheriff, MD         Medical Service: Internal Medicine Teaching Service         Attending Physician: Dr. Rebeca Alert Raynaldo Opitz, MD    First Contact: Dr. Tarri Abernethy Pager: [phone]  Second Contact: Dr. Danford Bad Pager: [phone]       After Hours (After 5p/  First Contact Pager: [phone]  weekends / holidays): Second Contact Pager: [phone]   Chief Complaint: Shortness of breath   History of Present Illness: Ricardo Le is a 77 year old male with CAD status post CABG in 2009, peripheral artery disease, type 3 diabetes mellitus, hypertension, and hyperlipidemia who presented to the emergency department with worsening exertional chest pain and dyspnea of 3 weeks duration. He states that he typically walks over 1 mile per day; however, over the past 3 weeks has not been able to ambulate further than 100 feet without getting shortness of breath, palpitations, and chest pressure. He then takes 1 of his nitros and rests with complete resolution of his symptoms. He states that approximately 2 years ago he had a heart catheterization that did show some stenosis but no intervention was pursued.  Prior to his CABG in 2009, he states that his symptoms were very similar.  He is otherwise in good health and denies any symptoms while at rest. Denies any associated symptoms.  He denies tobacco or alcohol use.  He specifically denies orthopnea, PND, lower extremity swelling, abdominal pain, nausea/vomiting, diarrhea, cough.   He is reluctant to stay the night hospital.  States that he talked to Dr. Johnsie Cancel already, who told him to come to the emergency department for further evaluation and possible heart catheterization.  [name] is currently asymptomatic.  Meds:  No outpatient medications have been marked as taking for the 02/19/17 encounter [facility] Encounter).    Allergies: Allergies as of 02/19/2017 - Review Complete 02/19/2017  Allergen Reaction Noted  . Rosuvastatin calcium Other (See Comments) 01/26/2015  . Iohexol Hives 12/03/2007  . Statins Other (See Comments) 05/05/2011   Past Medical History:  Diagnosis Date  . CAD (coronary artery disease)    a. 04/2007 s/p CABG x 4  - LIMA->LAD, VG->OM1->Om2, VG->PDA;  b. 12/2008 & 06/2010 Caths - Native multivessel dzs with 4/4 patent grafts. c. cath 03/09/2015 patent grafts, unchanged from prior cath.  . Cancer [facility])    pancreatic  March 2011  . Carotid bruit    a. 07/2010 U/S- 0-39% bilat ICA stenosis  . Chest pain    Noncardiac probably related to reflux  . DDD (degenerative disc disease)    several surgeries  . GERD (gastroesophageal reflux disease)   . HNP (herniated nucleus pulposus), lumbar    a. L2-3, s/p laminotomy, microdiskectomy 02/2009  . HOH (hard of hearing)    uses amplifiers   . Nephrolithiasis   . PAD (peripheral artery disease) ([place]) 04/2014   s/p L Common Iliac Stent  . Palpitations   . 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 Pneumonia    hx of  pna  . Pure hypercholesterolemia    statin intolerance  . Type II or unspecified type diabetes mellitus without mention of complication, not stated as uncontrolled    insulin  dependent  . Unspecified essential hypertension    Family History: Mother: + Heart disease Father: + Died at 60 from MI Sister: + CAD Brothers: + DM and CAD  Social History:  Married for >50 years  Works on his farm daily  Denies current or prior use of tobacco, EtOH, or illicit substance  Review of Systems: A complete ROS was negative except as per HPI.   Physical Exam: Blood pressure (!) 147/60, pulse (!) 55, temperature (!) 97.4 F (36.3 C), temperature source Oral, resp. rate 17, height 5\' 7"  (1.702 m), weight 160 lb (72.6 kg), SpO2 96 %.  General: Well nourished male in no acute distress HENT:  Normocephalic, atraumatic, moist mucus membranes  Pulm: Good air movement with faint bibasilar crackles  CV: RRR, no murmurs, no rubs, no JVD appreciated  Abdomen: Active bowel sounds, soft, non-distended, no tenderness to palpation  Extremities: No LE edema, pulses palpable in the UE bilaterally  Skin: Multiple, diffusely located seborrheic keratosis  Neuro: Alert and oriented x 3  EKG: personally reviewed: my interpretation is, Sinus rhythm with indeterminate axis deviation. Normal intervals. No ST elevation, no PR depression, or T wave inversion.   CXR: personally reviewed: my interpretation is Poor inspiration and penetration. Low lung volumes. No bony or soft tissue abnormalities   Assessment & Plan by Problem: Active Problems:   Chest pain  Shortness of breath. Ricardo Le is a 77 year old male with CAD status post CABG in 2009, peripheral artery disease, type 3 diabetes mellitus, hypertension, and hyperlipidemia who presented to the emergency department with worsening exertional chest pain and dyspnea of 3 weeks duration. His initial EKG was unchanged from prior. Initial troponin was negative. He did have a heart catheterization in January 2017 that illustrated patent grafts; however, his signs and symptoms are most concerning for unstable angina. Also on the differential is chronotropic incompetence, early heart failure, and arrhythmias. Unlike to be secondary to acute infection (PNA), anemia, structural lung disease. Cardiology has been consulted and will evaluate the patient. - Admit to telemetry  - Repeat EKG in the AM  - Trend troponin - Cardiology consulted, appreciate the recommendations  Type 3 Diabetes Mellitus  - Lantus 10 units QHS + SSI  Hypertension  - Continue Amlodipine and Lisinopril-HCTZ combo pill  Hyperlipidemia  - Continue Atorvastatin   Diet: NPO until cardiology evaluates  VTE ppx: Hold VTE ppx incase cardiology pursues intervention  Code Status: Full  code  Dispo: Admit patient to Observation with expected length of stay less than 2 midnights.  SignedIna Homes, MD 02/19/2017, 2:06 PM  My Pager: [phone]

## 2017-02-19 NOTE — Telephone Encounter (Signed)
Call transferred to triage for CP, SOB.  Started about a week ago, at rest he is "okay" but when he tries to do anything it really hurts and is hard to breathe.   Takes NTG and symptoms are very briefly relieved. Hx of CABG. Advised to be seen in ER ASAP but pt refused.  Wanted to come to office to see Dr. Johnsie Cancel and have a stress test.  Explained process and urged pt to go to ER.  I spoke with Dr. Johnsie Cancel who called patient and told him to go to ER now for symptoms.   Paged cardmaster to inform and asked her to relay to ER triage nurse per Dr. Kyla Balzarine request.

## 2017-02-19 NOTE — ED Notes (Signed)
ED Provider at bedside. 

## 2017-02-20 ENCOUNTER — Encounter (HOSPITAL_COMMUNITY): Payer: Self-pay | Admitting: *Deleted

## 2017-02-20 ENCOUNTER — Telehealth: Payer: Self-pay | Admitting: *Deleted

## 2017-02-20 ENCOUNTER — Encounter (HOSPITAL_COMMUNITY)
Admission: EM | Disposition: A | Discharge status: Home / Self Care | Payer: Self-pay | Source: Home / Self Care | Attending: Internal Medicine

## 2017-02-20 DIAGNOSIS — I25119 Atherosclerotic heart disease of native coronary artery with unspecified angina pectoris: Secondary | ICD-10-CM

## 2017-02-20 DIAGNOSIS — E7800 Pure hypercholesterolemia, unspecified: Secondary | ICD-10-CM | POA: Diagnosis present

## 2017-02-20 DIAGNOSIS — I472 Ventricular tachycardia: Secondary | ICD-10-CM | POA: Diagnosis not present

## 2017-02-20 DIAGNOSIS — R072 Precordial pain: Secondary | ICD-10-CM | POA: Diagnosis present

## 2017-02-20 DIAGNOSIS — E785 Hyperlipidemia, unspecified: Secondary | ICD-10-CM | POA: Diagnosis present

## 2017-02-20 DIAGNOSIS — I25110 Atherosclerotic heart disease of native coronary artery with unstable angina pectoris: Secondary | ICD-10-CM | POA: Diagnosis present

## 2017-02-20 DIAGNOSIS — R0602 Shortness of breath: Secondary | ICD-10-CM

## 2017-02-20 DIAGNOSIS — E118 Type 2 diabetes mellitus with unspecified complications: Secondary | ICD-10-CM

## 2017-02-20 DIAGNOSIS — Z833 Family history of diabetes mellitus: Secondary | ICD-10-CM | POA: Diagnosis not present

## 2017-02-20 DIAGNOSIS — Z90410 Acquired total absence of pancreas: Secondary | ICD-10-CM

## 2017-02-20 DIAGNOSIS — Z79899 Other long term (current) drug therapy: Secondary | ICD-10-CM | POA: Diagnosis not present

## 2017-02-20 DIAGNOSIS — I739 Peripheral vascular disease, unspecified: Secondary | ICD-10-CM | POA: Diagnosis not present

## 2017-02-20 DIAGNOSIS — Z794 Long term (current) use of insulin: Secondary | ICD-10-CM

## 2017-02-20 DIAGNOSIS — I10 Essential (primary) hypertension: Secondary | ICD-10-CM | POA: Diagnosis present

## 2017-02-20 DIAGNOSIS — Z7982 Long term (current) use of aspirin: Secondary | ICD-10-CM | POA: Diagnosis not present

## 2017-02-20 DIAGNOSIS — E782 Mixed hyperlipidemia: Secondary | ICD-10-CM | POA: Diagnosis not present

## 2017-02-20 DIAGNOSIS — E891 Postprocedural hypoinsulinemia: Secondary | ICD-10-CM | POA: Diagnosis present

## 2017-02-20 DIAGNOSIS — Z8249 Family history of ischemic heart disease and other diseases of the circulatory system: Secondary | ICD-10-CM | POA: Diagnosis not present

## 2017-02-20 DIAGNOSIS — E1351 Other specified diabetes mellitus with diabetic peripheral angiopathy without gangrene: Secondary | ICD-10-CM | POA: Diagnosis present

## 2017-02-20 DIAGNOSIS — Z9081 Acquired absence of spleen: Secondary | ICD-10-CM | POA: Diagnosis not present

## 2017-02-20 DIAGNOSIS — R079 Chest pain, unspecified: Secondary | ICD-10-CM | POA: Diagnosis present

## 2017-02-20 DIAGNOSIS — Z951 Presence of aortocoronary bypass graft: Secondary | ICD-10-CM | POA: Diagnosis not present

## 2017-02-20 DIAGNOSIS — I209 Angina pectoris, unspecified: Secondary | ICD-10-CM | POA: Diagnosis not present

## 2017-02-20 DIAGNOSIS — I6529 Occlusion and stenosis of unspecified carotid artery: Secondary | ICD-10-CM | POA: Diagnosis present

## 2017-02-20 DIAGNOSIS — K219 Gastro-esophageal reflux disease without esophagitis: Secondary | ICD-10-CM | POA: Diagnosis present

## 2017-02-20 HISTORY — PX: LEFT HEART CATH AND CORS/GRAFTS ANGIOGRAPHY: CATH118250

## 2017-02-20 LAB — COMPREHENSIVE METABOLIC PANEL
ALT: 31 U/L (ref 17–63)
AST: 28 U/L (ref 15–41)
Albumin: 3.4 g/dL — ABNORMAL LOW (ref 3.5–5.0)
Alkaline Phosphatase: 60 U/L (ref 38–126)
Anion gap: 11 (ref 5–15)
BUN: 16 mg/dL (ref 6–20)
CO2: 26 mmol/L (ref 22–32)
Calcium: 9.5 mg/dL (ref 8.9–10.3)
Chloride: 101 mmol/L (ref 101–111)
Creatinine, Ser: 0.85 mg/dL (ref 0.61–1.24)
GFR calc Af Amer: >60 mL/min (ref >60)
GFR calc non Af Amer: >60 mL/min (ref >60)
Glucose, Bld: 187 mg/dL — ABNORMAL HIGH (ref 65–99)
Potassium: 3.9 mmol/L (ref 3.5–5.1)
Sodium: 138 mmol/L (ref 135–145)
Total Bilirubin: 1 mg/dL (ref 0.3–1.2)
Total Protein: 7 g/dL (ref 6.5–8.1)

## 2017-02-20 LAB — CBC
HCT: 40.7 % (ref 39.0–52.0)
Hemoglobin: 13.1 g/dL (ref 13.0–17.0)
MCH: 30.3 pg (ref 26.0–34.0)
MCHC: 32.2 g/dL (ref 30.0–36.0)
MCV: 94 fL (ref 78.0–100.0)
Platelets: 260 10*3/uL (ref 150–400)
RBC: 4.33 MIL/uL (ref 4.22–5.81)
RDW: 13.6 % (ref 11.5–15.5)
WBC: 9.7 10*3/uL (ref 4.0–10.5)

## 2017-02-20 LAB — GLUCOSE, CAPILLARY
Glucose-Capillary: 177 mg/dL — ABNORMAL HIGH (ref 65–99)
Glucose-Capillary: 114 mg/dL — ABNORMAL HIGH (ref 65–99)
Glucose-Capillary: 140 mg/dL — ABNORMAL HIGH (ref 65–99)
Glucose-Capillary: 211 mg/dL — ABNORMAL HIGH (ref 65–99)
Glucose-Capillary: 250 mg/dL — ABNORMAL HIGH (ref 65–99)

## 2017-02-20 LAB — PROTIME-INR
INR: 1.03
Prothrombin Time: 13.4 seconds (ref 11.4–15.2)

## 2017-02-20 LAB — TROPONIN I: Troponin I: <0.03 ng/mL (ref <0.03)

## 2017-02-20 SURGERY — LEFT HEART CATH AND CORS/GRAFTS ANGIOGRAPHY
Anesthesia: LOCAL

## 2017-02-20 MED ORDER — LISINOPRIL 20 MG PO TABS
40.0000 mg | ORAL_TABLET | Freq: Every day | ORAL | Status: DC
  Administered 2017-02-20: 40 mg via ORAL
  Filled 2017-02-20: qty 2

## 2017-02-20 MED ORDER — SODIUM CHLORIDE 0.9% FLUSH
3.0000 mL | Freq: Two times a day (BID) | INTRAVENOUS | Status: DC
  Administered 2017-02-20 – 2017-02-21 (×2): 3 mL via INTRAVENOUS

## 2017-02-20 MED ORDER — HEPARIN (PORCINE) IN NACL 2-0.9 UNIT/ML-% IJ SOLN
INTRAMUSCULAR | Status: AC
  Filled 2017-02-20: qty 1000

## 2017-02-20 MED ORDER — EZETIMIBE 10 MG PO TABS
10.0000 mg | ORAL_TABLET | Freq: Every day | ORAL | Status: DC
  Administered 2017-02-20: 10 mg via ORAL
  Filled 2017-02-20: qty 1

## 2017-02-20 MED ORDER — DIPHENHYDRAMINE HCL 50 MG/ML IJ SOLN
25.0000 mg | Freq: Once | INTRAMUSCULAR | Status: AC
  Administered 2017-02-20: 25 mg via INTRAVENOUS
  Filled 2017-02-20: qty 1

## 2017-02-20 MED ORDER — IOPAMIDOL (ISOVUE-370) INJECTION 76%
INTRAVENOUS | Status: AC
  Filled 2017-02-20: qty 100

## 2017-02-20 MED ORDER — MIDAZOLAM HCL 2 MG/2ML IJ SOLN
INTRAMUSCULAR | Status: DC | PRN
  Administered 2017-02-20: 2 mg via INTRAVENOUS

## 2017-02-20 MED ORDER — LIDOCAINE HCL (PF) 1 % IJ SOLN
INTRAMUSCULAR | Status: AC
  Filled 2017-02-20: qty 30

## 2017-02-20 MED ORDER — HEPARIN SODIUM (PORCINE) 1000 UNIT/ML IJ SOLN
INTRAMUSCULAR | Status: DC | PRN
  Administered 2017-02-20: 4000 [IU] via INTRAVENOUS

## 2017-02-20 MED ORDER — VERAPAMIL HCL 2.5 MG/ML IV SOLN
INTRAVENOUS | Status: AC
  Filled 2017-02-20: qty 2

## 2017-02-20 MED ORDER — LIDOCAINE HCL (PF) 1 % IJ SOLN
INTRAMUSCULAR | Status: DC | PRN
  Administered 2017-02-20: 2 mL

## 2017-02-20 MED ORDER — SODIUM CHLORIDE 0.9 % IV SOLN
135.0000 mg | Freq: Once | INTRAVENOUS | Status: DC
  Filled 2017-02-20: qty 1.12

## 2017-02-20 MED ORDER — IOPAMIDOL (ISOVUE-370) INJECTION 76%
INTRAVENOUS | Status: DC | PRN
  Administered 2017-02-20: 90 mL via INTRA_ARTERIAL

## 2017-02-20 MED ORDER — ONDANSETRON HCL 4 MG/2ML IJ SOLN: 4.0000 mg | Freq: Four times a day (QID) | INTRAMUSCULAR | Status: DC | PRN

## 2017-02-20 MED ORDER — VERAPAMIL HCL 2.5 MG/ML IV SOLN
INTRAVENOUS | Status: DC | PRN
  Administered 2017-02-20: 10 mL via INTRA_ARTERIAL

## 2017-02-20 MED ORDER — METHYLPREDNISOLONE SODIUM SUCC 125 MG IJ SOLR
125.0000 mg | Freq: Once | INTRAMUSCULAR | Status: AC
  Administered 2017-02-20: 125 mg via INTRAVENOUS
  Filled 2017-02-20: qty 2

## 2017-02-20 MED ORDER — SODIUM CHLORIDE 0.9 % IV SOLN: 250.0000 mL | INTRAVENOUS | Status: DC | PRN

## 2017-02-20 MED ORDER — HEPARIN SODIUM (PORCINE) 1000 UNIT/ML IJ SOLN
INTRAMUSCULAR | Status: AC
  Filled 2017-02-20: qty 1

## 2017-02-20 MED ORDER — FENTANYL CITRATE (PF) 100 MCG/2ML IJ SOLN
INTRAMUSCULAR | Status: AC
  Filled 2017-02-20: qty 2

## 2017-02-20 MED ORDER — MIDAZOLAM HCL 2 MG/2ML IJ SOLN
INTRAMUSCULAR | Status: AC
  Filled 2017-02-20: qty 2

## 2017-02-20 MED ORDER — MUPIROCIN 2 % EX OINT
TOPICAL_OINTMENT | Freq: Two times a day (BID) | CUTANEOUS | Status: DC
  Administered 2017-02-20 (×2): via NASAL
  Filled 2017-02-20: qty 22

## 2017-02-20 MED ORDER — FENTANYL CITRATE (PF) 100 MCG/2ML IJ SOLN
INTRAMUSCULAR | Status: DC | PRN
  Administered 2017-02-20: 25 ug via INTRAVENOUS

## 2017-02-20 MED ORDER — SODIUM CHLORIDE 0.9 % IV SOLN
INTRAVENOUS | Status: AC
  Administered 2017-02-20: 18:00:00 via INTRAVENOUS

## 2017-02-20 MED ORDER — HEPARIN (PORCINE) IN NACL 2-0.9 UNIT/ML-% IJ SOLN
INTRAMUSCULAR | Status: AC | PRN
  Administered 2017-02-20: 1000 mL

## 2017-02-20 MED ORDER — SODIUM CHLORIDE 0.9% FLUSH: 3.0000 mL | INTRAVENOUS | Status: DC | PRN

## 2017-02-20 SURGICAL SUPPLY — 11 items
CATH IMPULSE 5F ANG/FL3.5 (CATHETERS) ×1 IMPLANT
CATH LAUNCHER 5F EBU3.5 (CATHETERS) ×1 IMPLANT
DEVICE RAD COMP TR BAND LRG (VASCULAR PRODUCTS) ×1 IMPLANT
GLIDESHEATH SLEND A-KIT 6F 22G (SHEATH) ×1 IMPLANT
GUIDEWIRE INQWIRE 1.5J.035X260 (WIRE) IMPLANT
INQWIRE 1.5J .035X260CM (WIRE) ×2
KIT HEART LEFT (KITS) ×2 IMPLANT
PACK CARDIAC CATHETERIZATION (CUSTOM PROCEDURE TRAY) ×2 IMPLANT
TRANSDUCER W/STOPCOCK (MISCELLANEOUS) ×2 IMPLANT
TUBING CIL FLEX 10 FLL-RA (TUBING) ×2 IMPLANT
WIRE HI TORQ VERSACORE-J 145CM (WIRE) ×1 IMPLANT

## 2017-02-20 NOTE — Progress Notes (Addendum)
   Subjective: Patient is doing well this morning.  He did have some recurrence of his chest pain when he got up to use the bathroom and take a bath.  He otherwise has been asymptomatic at rest.  He is anxious to do his heart cath.  Feels that this is related to his heart and his blood vessels.  Wife is at bedside.  Both asking when he will go for his heart cath.  Discussed that cardiology will be by to see him today at which point they may be able to provide more definitive timeline.  All questions and concerns addressed.  Objective: Vital signs in last 24 hours: Vitals:   02/19/17 1530 02/19/17 1933 02/19/17 2321 02/20/17 0426  BP: (!) 153/66 136/60 (!) 157/59 (!) 137/59  Pulse: (!) 58 63 62 67  Resp: 19 17 19 18   Temp:  97.9 F (36.6 C) 98 F (36.7 C) 98 F (36.7 C)  TempSrc:  Oral Oral Oral  SpO2: 94% 93% 94% 93%  Weight:    159 lb 9.8 oz (72.4 kg)  Height:       General: Well-nourished male in no acute distress Pulm: Good air movement with faint bibasilar crackles CV: Regular rate and rhythm, no murmur, no rubs Abdomen: Active bowel sounds, soft, nondistended Extremities: No lower extremity edema  Assessment/Plan:  Shortness of Breath  - Patient presented with 3 weeks of exertional dyspnea, palpitations, and chest pressure.  - Troponin negative x 3 - Telemetry showing some episodes of bradycardia overnight  - Cardiology planning for a cath today, patient currently NPO  Diabetes Mellitus 2/2 pancreatectomy  - Lantus 10 units QHS + SSI  Hypertension  - Continue Amlodipine and HCTZ  - Cardiology increasing Lisinopril to 40 mg QD  Hyperlipidemia  - Continue Atorvastatin and ezetimibe  Dispo: Anticipated discharge in approximately 1-2 day(s).   Ina Homes, MD 02/20/2017, 5:37 AM My Pager: [phone]

## 2017-02-20 NOTE — H&P (View-Only) (Signed)
Progress Note  Patient Name: Ricardo Le Date of Encounter: 02/20/2017  Primary Cardiologist: Jenkins Rouge, MD   Subjective   Feeling well at rest.  However he has chest pain with exertion.  Inpatient Medications    Scheduled Meds: . amLODipine  10 mg Oral Daily  . aspirin EC  81 mg Oral QHS  . atorvastatin  10 mg Oral Daily  . hydrochlorothiazide  12.5 mg Oral Daily  . insulin aspart  0-9 Units Subcutaneous Q4H  . insulin glargine  5 Units Subcutaneous QHS  . lisinopril  20 mg Oral Daily  . mupirocin ointment   Nasal BID  . pantoprazole  40 mg Oral Daily  . sodium chloride flush  3 mL Intravenous Q12H  . tamsulosin  0.4 mg Oral QPC supper   Continuous Infusions: . sodium chloride    . sodium chloride 1 mL/kg/hr (02/20/17 0713)   PRN Meds: sodium chloride, acetaminophen **OR** acetaminophen, senna-docusate, sodium chloride flush   Vital Signs    Vitals:   02/19/17 1933 02/19/17 2321 02/20/17 0426 02/20/17 0800  BP: 136/60 (!) 157/59 (!) 137/59   Pulse: 63 62 67   Resp: 17 19 18    Temp: 97.9 F (36.6 C) 98 F (36.7 C) 98 F (36.7 C) 98 F (36.7 C)  TempSrc: Oral Oral Oral Oral  SpO2: 93% 94% 93%   Weight:   159 lb 9.8 oz (72.4 kg)   Height:        Intake/Output Summary (Last 24 hours) at 02/20/2017 0909 Last data filed at 02/20/2017 0431 Gross per 24 hour  Intake -  Output 625 ml  Net -625 ml   Filed Weights   02/19/17 1051 02/20/17 0426  Weight: 160 lb (72.6 kg) 159 lb 9.8 oz (72.4 kg)    Telemetry    Sinus rhythm, sinus bradycardia.  No events- Personally Reviewed  ECG    n/a - Personally Reviewed  Physical Exam   VS:  BP (!) 137/59 (BP Location: Right Arm)   Pulse 67   Temp 98 F (36.7 C) (Oral)   Resp 18   Ht 5\' 7"  (1.702 m)   Wt 159 lb 9.8 oz (72.4 kg)   SpO2 93%   BMI 25.00 kg/m  , BMI Body mass index is 25 kg/m. GENERAL:  Well appearing HEENT: Pupils equal round and reactive, fundi not visualized, oral mucosa  unremarkable NECK:  No jugular venous distention, waveform within normal limits, carotid upstroke brisk and symmetric, no bruits, no thyromegaly LYMPHATICS:  No cervical adenopathy LUNGS:  Clear to auscultation bilaterally HEART:  RRR.  PMI not displaced or sustained,S1 and S2 within normal limits, no S3, no S4, no clicks, no rubs, no murmurs ABD:  Flat, positive bowel sounds normal in frequency in pitch, no bruits, no rebound, no guarding, no midline pulsatile mass, no hepatomegaly, no splenomegaly EXT:  2 plus pulses throughout, no edema, no cyanosis no clubbing SKIN:  No rashes no nodules NEURO:  Cranial nerves II through XII grossly intact, motor grossly intact throughout PSYCH:  Cognitively intact, oriented to person place and time   Labs    Chemistry Recent Labs  Lab 02/19/17 1102 02/20/17 0236  NA 138 138  K 3.9 3.9  CL 103 101  CO2 25 26  GLUCOSE 168* 187*  BUN 21* 16  CREATININE 0.95 0.85  CALCIUM 9.3 9.5  PROT  --  7.0  ALBUMIN  --  3.4*  AST  --  28  ALT  --  31  ALKPHOS  --  60  BILITOT  --  1.0  GFRNONAA >60 >60  GFRAA >60 >60  ANIONGAP 10 11     Hematology Recent Labs  Lab 02/19/17 1102 02/20/17 0236  WBC 11.6* 9.7  RBC 4.57 4.33  HGB 13.9 13.1  HCT 42.9 40.7  MCV 93.9 94.0  MCH 30.4 30.3  MCHC 32.4 32.2  RDW 13.6 13.6  PLT 288 260    Cardiac Enzymes Recent Labs  Lab 02/19/17 1450 02/19/17 1949 02/20/17 0236  TROPONINI <0.03 <0.03 <0.03    Recent Labs  Lab 02/19/17 1112  TROPIPOC 0.01     BNPNo results for input(s): BNP, PROBNP in the last 168 hours.   DDimer No results for input(s): DDIMER in the last 168 hours.   Radiology    Dg Chest 2 View  Result Date: 02/19/2017 CLINICAL DATA:  Chest pain and shortness of breath. EXAM: CHEST  2 VIEW COMPARISON:  Chest x-ray dated March 08, 2015. FINDINGS: The cardiomediastinal silhouette is normal in size. Prior CABG. Normal pulmonary vascularity. Low lung volumes with bronchovascular  crowding and bibasilar atelectasis. No focal consolidation, pleural effusion, or pneumothorax. No acute osseous abnormality. IMPRESSION: Low lung volumes with bibasilar atelectasis. Electronically Signed   By: Titus Dubin M.D.   On: 02/19/2017 11:43    Cardiac Studies   Carotid Dopplers 02/16/16: Right Carotid: There is evidence in the right ICA of a stable 1-39% stenosis.        Non-hemodynamically significant plaque <50% noted in the CCA. The        ECA appears >50% stenosed.  Left Carotid: There is evidence in the left ICA of a stable 1-39% stenosis.       Non-hemodynamically significant plaque noted in the CCA. The ECA       appears >50% stenosed.  LHC 03/09/15:  LM lesion, 70% stenosed.  Prox LAD lesion, 50% stenosed.  2nd Diag lesion, 70% stenosed.  Mid LAD lesion, 70% stenosed.  Dist LAD lesion, 100% stenosed.  Prox Cx lesion, 80% stenosed.  Mid Cx lesion, 80% stenosed.  Prox RCA lesion, 100% stenosed.  Dist Cx lesion, 95% stenosed.  SVG was injected is normal in caliber, and is anatomically normal.  Post Atrio lesion, 80% stenosed.  LIMA was injected is normal in caliber, and is anatomically normal.  SVG was injected is normal in caliber, and is anatomically normal.  The left ventricular systolic function is normal.   Normal LV function without focal segmental wall motion abnormalities.  Ejection fraction 55-60%.   Patient Profile     Mr. Oravec is a 60M with CAD s/p CABG (LIMA-->LAD, SVG-->OM, OMS, SVG-->PDA in 2009, hypertension, hyperlipidemia, carotid stenosis, diabetes, and GERD here with unstable angina.  Assessment & Plan    # Unstable angina: # CAD s/p CABG: # Hyperlipidemia:  Patient continues to have angina when ambulating around the room.  He is okay when at rest.  Plan for left heart catheterization today.  LDL was 98 12/2015.  Check fasting lipids.  He only tolerates low-dose statins.  We will add Zetia 10  mg daily.  Continue aspirin and amlodipine.  He is not on beta-blocker due to bradycardia.    # Hypertension: BP goal is <130/80.  He is frequently above goal.  We will increase lisinopril to 40.  # Carotid stenosis: Continue aspirin and addressing lipids as above.     For questions or updates, please contact [place] Please consult [email or web address] for contact info  under Cardiology/STEMI.      Signed, Skeet Latch, MD  02/20/2017, 9:09 AM

## 2017-02-20 NOTE — Telephone Encounter (Signed)
-----   Message from Wellington Hampshire, MD sent at 02/18/2017  9:43 PM EST ----- Mild nonobstructive carotid disease. Continue medical therapy.

## 2017-02-20 NOTE — Progress Notes (Signed)
  Date: 02/20/2017  Patient name: Ricardo Le  Medical record number: [id number]  Date of birth: November 16, 1940   I have seen and evaluated this patient and I have discussed the plan of care with the house staff. Please see their note for complete details. I concur with their findings with the following additions/corrections:   77 year old man with a history of CAD status post CABG in 2009, also with peripheral artery disease, who presented with 3 weeks of worsening dyspnea on exertion and angina. EKG is unchanged and troponins are negative, but his story is highly concerning for increasing angina. He has already been seen by cardiology, and the plan is for a left heart catheterization today. On rounds this morning, he reported feeling well, except getting some chest pressure when he bathes himself this morning. However, at rest, he continues to feel well.  Lipid panel is pending, cardiology recommended adding Zetia and increasing his lisinopril to 40 mg.  Oda Kilts, MD 02/20/2017, 2:01 PM

## 2017-02-20 NOTE — Progress Notes (Signed)
Progress Note  Patient Name: Ricardo Le Date of Encounter: 02/20/2017  Primary Cardiologist: Jenkins Rouge, MD   Subjective   Feeling well at rest.  However he has chest pain with exertion.  Inpatient Medications    Scheduled Meds: . amLODipine  10 mg Oral Daily  . aspirin EC  81 mg Oral QHS  . atorvastatin  10 mg Oral Daily  . hydrochlorothiazide  12.5 mg Oral Daily  . insulin aspart  0-9 Units Subcutaneous Q4H  . insulin glargine  5 Units Subcutaneous QHS  . lisinopril  20 mg Oral Daily  . mupirocin ointment   Nasal BID  . pantoprazole  40 mg Oral Daily  . sodium chloride flush  3 mL Intravenous Q12H  . tamsulosin  0.4 mg Oral QPC supper   Continuous Infusions: . sodium chloride    . sodium chloride 1 mL/kg/hr (02/20/17 0713)   PRN Meds: sodium chloride, acetaminophen **OR** acetaminophen, senna-docusate, sodium chloride flush   Vital Signs    Vitals:   02/19/17 1933 02/19/17 2321 02/20/17 0426 02/20/17 0800  BP: 136/60 (!) 157/59 (!) 137/59   Pulse: 63 62 67   Resp: 17 19 18    Temp: 97.9 F (36.6 C) 98 F (36.7 C) 98 F (36.7 C) 98 F (36.7 C)  TempSrc: Oral Oral Oral Oral  SpO2: 93% 94% 93%   Weight:   159 lb 9.8 oz (72.4 kg)   Height:        Intake/Output Summary (Last 24 hours) at 02/20/2017 0909 Last data filed at 02/20/2017 0431 Gross per 24 hour  Intake -  Output 625 ml  Net -625 ml   Filed Weights   02/19/17 1051 02/20/17 0426  Weight: 160 lb (72.6 kg) 159 lb 9.8 oz (72.4 kg)    Telemetry    Sinus rhythm, sinus bradycardia.  No events- Personally Reviewed  ECG    n/a - Personally Reviewed  Physical Exam   VS:  BP (!) 137/59 (BP Location: Right Arm)   Pulse 67   Temp 98 F (36.7 C) (Oral)   Resp 18   Ht 5\' 7"  (1.702 m)   Wt 159 lb 9.8 oz (72.4 kg)   SpO2 93%   BMI 25.00 kg/m  , BMI Body mass index is 25 kg/m. GENERAL:  Well appearing HEENT: Pupils equal round and reactive, fundi not visualized, oral mucosa  unremarkable NECK:  No jugular venous distention, waveform within normal limits, carotid upstroke brisk and symmetric, no bruits, no thyromegaly LYMPHATICS:  No cervical adenopathy LUNGS:  Clear to auscultation bilaterally HEART:  RRR.  PMI not displaced or sustained,S1 and S2 within normal limits, no S3, no S4, no clicks, no rubs, no murmurs ABD:  Flat, positive bowel sounds normal in frequency in pitch, no bruits, no rebound, no guarding, no midline pulsatile mass, no hepatomegaly, no splenomegaly EXT:  2 plus pulses throughout, no edema, no cyanosis no clubbing SKIN:  No rashes no nodules NEURO:  Cranial nerves II through XII grossly intact, motor grossly intact throughout PSYCH:  Cognitively intact, oriented to person place and time   Labs    Chemistry Recent Labs  Lab 02/19/17 1102 02/20/17 0236  NA 138 138  K 3.9 3.9  CL 103 101  CO2 25 26  GLUCOSE 168* 187*  BUN 21* 16  CREATININE 0.95 0.85  CALCIUM 9.3 9.5  PROT  --  7.0  ALBUMIN  --  3.4*  AST  --  28  ALT  --  31  ALKPHOS  --  60  BILITOT  --  1.0  GFRNONAA >60 >60  GFRAA >60 >60  ANIONGAP 10 11     Hematology Recent Labs  Lab 02/19/17 1102 02/20/17 0236  WBC 11.6* 9.7  RBC 4.57 4.33  HGB 13.9 13.1  HCT 42.9 40.7  MCV 93.9 94.0  MCH 30.4 30.3  MCHC 32.4 32.2  RDW 13.6 13.6  PLT 288 260    Cardiac Enzymes Recent Labs  Lab 02/19/17 1450 02/19/17 1949 02/20/17 0236  TROPONINI <0.03 <0.03 <0.03    Recent Labs  Lab 02/19/17 1112  TROPIPOC 0.01     BNPNo results for input(s): BNP, PROBNP in the last 168 hours.   DDimer No results for input(s): DDIMER in the last 168 hours.   Radiology    Dg Chest 2 View  Result Date: 02/19/2017 CLINICAL DATA:  Chest pain and shortness of breath. EXAM: CHEST  2 VIEW COMPARISON:  Chest x-ray dated March 08, 2015. FINDINGS: The cardiomediastinal silhouette is normal in size. Prior CABG. Normal pulmonary vascularity. Low lung volumes with bronchovascular  crowding and bibasilar atelectasis. No focal consolidation, pleural effusion, or pneumothorax. No acute osseous abnormality. IMPRESSION: Low lung volumes with bibasilar atelectasis. Electronically Signed   By: Titus Dubin M.D.   On: 02/19/2017 11:43    Cardiac Studies   Carotid Dopplers 02/16/16: Right Carotid: There is evidence in the right ICA of a stable 1-39% stenosis.        Non-hemodynamically significant plaque <50% noted in the CCA. The        ECA appears >50% stenosed.  Left Carotid: There is evidence in the left ICA of a stable 1-39% stenosis.       Non-hemodynamically significant plaque noted in the CCA. The ECA       appears >50% stenosed.  LHC 03/09/15:  LM lesion, 70% stenosed.  Prox LAD lesion, 50% stenosed.  2nd Diag lesion, 70% stenosed.  Mid LAD lesion, 70% stenosed.  Dist LAD lesion, 100% stenosed.  Prox Cx lesion, 80% stenosed.  Mid Cx lesion, 80% stenosed.  Prox RCA lesion, 100% stenosed.  Dist Cx lesion, 95% stenosed.  SVG was injected is normal in caliber, and is anatomically normal.  Post Atrio lesion, 80% stenosed.  LIMA was injected is normal in caliber, and is anatomically normal.  SVG was injected is normal in caliber, and is anatomically normal.  The left ventricular systolic function is normal.   Normal LV function without focal segmental wall motion abnormalities.  Ejection fraction 55-60%.   Patient Profile     [name] is a 56M with CAD s/p CABG (LIMA-->LAD, SVG-->OM, OMS, SVG-->PDA in 2009, hypertension, hyperlipidemia, carotid stenosis, diabetes, and GERD here with unstable angina.  Assessment & Plan    # Unstable angina: # CAD s/p CABG: # Hyperlipidemia:  Patient continues to have angina when ambulating around the room.  He is okay when at rest.  Plan for left heart catheterization today.  LDL was 98 12/2015.  Check fasting lipids.  He only tolerates low-dose statins.  We will add Zetia 10  mg daily.  Continue aspirin and amlodipine.  He is not on beta-blocker due to bradycardia.    # Hypertension: BP goal is <130/80.  He is frequently above goal.  We will increase lisinopril to 40.  # Carotid stenosis: Continue aspirin and addressing lipids as above.     For questions or updates, please contact [place] Please consult [email or web address] for contact info  under Cardiology/STEMI.      Signed, Skeet Latch, MD  02/20/2017, 9:09 AM

## 2017-02-20 NOTE — Telephone Encounter (Signed)
Left a message to call back for carotid results.

## 2017-02-20 NOTE — Interval H&P Note (Signed)
History and Physical Interval Note:  02/20/2017 4:33 PM  Ricardo Le  has presented today for surgery, with the diagnosis of angina, class III. The various methods of treatment have been discussed with the patient and family. After consideration of risks, benefits and other options for treatment, the patient has consented to  Procedure(s): LEFT HEART CATH AND CORS/GRAFTS ANGIOGRAPHY (N/A) as a surgical intervention .  The patient's history has been reviewed, patient examined, no change in status, stable for surgery.  I have reviewed the patient's chart and labs.  Questions were answered to the patient's satisfaction.    Cath Lab Visit (complete for each Cath Lab visit)  Clinical Evaluation Leading to the Procedure:   ACS: No.  Non-ACS:    Anginal Classification: CCS III  Anti-ischemic medical therapy: Minimal Therapy (1 class of medications)  Non-Invasive Test Results: No non-invasive testing performed  Prior CABG: Previous CABG    Ricardo Le

## 2017-02-21 ENCOUNTER — Encounter (HOSPITAL_COMMUNITY): Payer: Self-pay | Admitting: Cardiology

## 2017-02-21 ENCOUNTER — Other Ambulatory Visit: Payer: Self-pay

## 2017-02-21 DIAGNOSIS — E782 Mixed hyperlipidemia: Secondary | ICD-10-CM

## 2017-02-21 LAB — BASIC METABOLIC PANEL
Anion gap: 10 (ref 5–15)
BUN: 21 mg/dL — ABNORMAL HIGH (ref 6–20)
CO2: 24 mmol/L (ref 22–32)
Calcium: 9 mg/dL (ref 8.9–10.3)
Chloride: 101 mmol/L (ref 101–111)
Creatinine, Ser: 0.95 mg/dL (ref 0.61–1.24)
GFR calc Af Amer: >60 mL/min (ref >60)
GFR calc non Af Amer: >60 mL/min (ref >60)
Glucose, Bld: 284 mg/dL — ABNORMAL HIGH (ref 65–99)
Potassium: 3.8 mmol/L (ref 3.5–5.1)
Sodium: 135 mmol/L (ref 135–145)

## 2017-02-21 LAB — GLUCOSE, CAPILLARY
Glucose-Capillary: 307 mg/dL — ABNORMAL HIGH (ref 65–99)
Glucose-Capillary: 215 mg/dL — ABNORMAL HIGH (ref 65–99)
Glucose-Capillary: 190 mg/dL — ABNORMAL HIGH (ref 65–99)

## 2017-02-21 LAB — LIPID PANEL
Cholesterol: 138 mg/dL (ref 0–200)
HDL: 43 mg/dL (ref >40)
LDL Cholesterol: 86 mg/dL (ref 0–99)
Total CHOL/HDL Ratio: 3.2 RATIO
Triglycerides: 44 mg/dL (ref <150)
VLDL: 9 mg/dL (ref 0–40)

## 2017-02-21 LAB — HEMOGLOBIN A1C
Hgb A1c MFr Bld: 6.5 % — ABNORMAL HIGH (ref 4.8–5.6)
Mean Plasma Glucose: 139.85 mg/dL

## 2017-02-21 MED ORDER — LISINOPRIL 20 MG PO TABS
20.0000 mg | ORAL_TABLET | Freq: Every day | ORAL | Status: DC
  Administered 2017-02-21: 20 mg via ORAL
  Filled 2017-02-21: qty 1

## 2017-02-21 MED ORDER — ISOSORBIDE MONONITRATE ER 60 MG PO TB24: 60.0000 mg | ORAL_TABLET | Freq: Every day | ORAL | 1 refills | Status: DC

## 2017-02-21 MED ORDER — ISOSORBIDE MONONITRATE ER 60 MG PO TB24
60.0000 mg | ORAL_TABLET | Freq: Every day | ORAL | Status: DC
  Administered 2017-02-21: 60 mg via ORAL
  Filled 2017-02-21: qty 1

## 2017-02-21 MED ORDER — EZETIMIBE 10 MG PO TABS: 10.0000 mg | ORAL_TABLET | Freq: Every day | ORAL | 1 refills | Status: DC

## 2017-02-21 NOTE — Discharge Summary (Signed)
Name: Ricardo Le MRN: [id number] DOB: 1940/05/06 78 y.o. PCP: Angelina Sheriff, MD  Date of Admission: 02/19/2017 11:03 AM Date of Discharge:  Attending Physician: No att. providers found  Discharge Diagnosis:  Angina Pectoris  Principal Problem:   Angina pectoris ([place]) Active Problems:   Type 2 diabetes mellitus with vascular disease ([place])   Essential hypertension   Hyperlipidemia   S/P CABG x 4   Precordial chest pain   Chest pain  Discharge Medications: Allergies as of 02/21/2017      Reactions   Rosuvastatin Calcium Other (See Comments)   Myalgias   Iohexol Hives   pt ok with premedication (Benadryl 50mg  PO), Onset Date: [id number]   Statins Other (See Comments)   Myalgias      Medication List    TAKE these medications   acetaminophen 325 MG tablet Commonly known as:  TYLENOL Take 650 mg by mouth every 6 (six) hours as needed (pain).   amLODipine 10 MG tablet Commonly known as:  NORVASC Take 10 mg by mouth daily.   aspirin EC 81 MG tablet Take 81 mg by mouth at bedtime.   atorvastatin 10 MG tablet Commonly known as:  LIPITOR TAKE ONE TABLET BY MOUTH DAILY   ezetimibe 10 MG tablet Commonly known as:  ZETIA Take 1 tablet (10 mg total) by mouth at bedtime.   glipiZIDE 10 MG tablet Commonly known as:  GLUCOTROL Take 10 mg by mouth 2 (two) times daily.   isosorbide mononitrate 60 MG 24 hr tablet Commonly known as:  IMDUR Take 1 tablet (60 mg total) by mouth daily.   LANTUS SOLOSTAR 100 UNIT/ML injection Generic drug:  insulin glargine Inject 10 Units into the skin at bedtime.   lisinopril-hydrochlorothiazide 20-12.5 MG tablet Commonly known as:  PRINZIDE,ZESTORETIC Take 1 tablet by mouth daily.   multivitamin with minerals Tabs tablet Take 1 tablet by mouth daily.   nitroGLYCERIN 0.4 MG SL tablet Commonly known as:  NITROSTAT Place 1 tablet (0.4 mg total) under the tongue every 5 (five) minutes as needed for chest pain.   omeprazole  20 MG capsule Commonly known as:  PRILOSEC Take 20 mg by mouth daily.   tamsulosin 0.4 MG Caps capsule Commonly known as:  FLOMAX Take 0.4 mg by mouth daily after supper.     Disposition and follow-up:   RicardoRik L Le was discharged from [facility] in Stable condition.  At the hospital follow up visit please address:  1.  Angina Pectoris. Please assess how the patient's exertional dyspnea doing. Please discuss whether he is tolerating the Imdur. Discuss whether he has followed up with cardiology.   2.  Labs / imaging needed at time of follow-up: None  3.  Pending labs/ test needing follow-up: None  Follow-up Appointments: Follow-up Information    Josue Hector, MD Follow up on 03/26/2017.   Specialty:  Cardiology Why:  8:45 AM hospital follow up Contact information: [address] [place] [address] [phone]        Isaiah Serge, NP Follow up on 03/12/2017.   Specialties:  Cardiology, Radiology Why:  9:00 AM hospital follow up Contact information: [place] [address] [phone]         Hospital Course by problem list:   1. Angina Pectoris.  [name] is a 77 year old male with CAD status post CABG in 2009, peripheral artery disease, diabetes mellitus status post pancreatectomy, hypertension, and  hyperlipidemia who presented to the emergency department with worsening exertional chest pain, palpitations, and dyspnea of 3 weeks duration. Initial EKG was unchanged compared to prior. Troponin x3 was negative. Cardiology was consulted who subsequently took the patient for left heart catheterization. Heart catheterization done on 1/8 illustrated severe diffuse native coronary artery disease with no obvious subjective changes since his last catheterization in 2017, suspected microvascular disease, and widely patent grafts. Cardiology recommended maximizing medical management. Unfortunately the patient is  not able to tolerate a beta-blocker due to bradycardia. He was started on Imdur 60 mg daily and Zetia 10 mg daily. On discharge we continued his amlodipine, hydrochlorothiazide, lisinopril, and atorvastatin. He will follow-up with cardiology within the next couple of weeks, at which point we recommend repeat lipids and CMP. The patient was hemodynamically stable and agreed with the plan for discharge. Return precautions given. Detailed catheterization report can be seen below.  Discharge Vitals:   BP 132/64 (BP Location: Left Arm)   Pulse 63   Temp 98.2 F (36.8 C) (Oral)   Resp 19   Ht 5\' 7"  (1.702 m)   Wt 158 lb 11.7 oz (72 kg)   SpO2 92%   BMI 24.86 kg/m   Pertinent Labs, Studies, and Procedures:   Left Heart Cath 1. Severe diffuse native coronary artery disease with no obvious objective change from last catheterization.  The remaining vessels are extremely small and targets.  No obvious culprit lesion to explain the patient's change in symptoms.  Suspect microvascular disease. 2. Widely patent grafts to diffusely diseased target vessels. 3. Normal LVEF with moderately elevated LVEDP.  Discharge Instructions: Discharge Instructions    Call MD for:   Complete by:  As directed    Continued shortness of breath with exertion   Diet - low sodium heart healthy   Complete by:  As directed    Increase activity slowly   Complete by:  As directed      Signed: Ina Homes, MD 02/23/2017, 3:06 PM   My Pager: [phone]   Internal Medicine Attending Note:  I saw and examined the patient on the day of discharge. I reviewed and agree with the discharge summary written by the house staff.

## 2017-02-21 NOTE — Progress Notes (Signed)
Progress Note  Patient Name: Ricardo Le Date of Encounter: 02/21/2017  Primary Cardiologist: Jenkins Rouge, MD   Subjective   Feeling well at rest.  Denies chest pain or shortness of breath.  He is feeling confused this morning.  Inpatient Medications    Scheduled Meds: . amLODipine  10 mg Oral Daily  . aspirin EC  81 mg Oral QHS  . atorvastatin  10 mg Oral Daily  . ezetimibe  10 mg Oral QHS  . hydrochlorothiazide  12.5 mg Oral Daily  . insulin aspart  0-9 Units Subcutaneous Q4H  . insulin glargine  5 Units Subcutaneous QHS  . lisinopril  40 mg Oral Daily  . mupirocin ointment   Nasal BID  . pantoprazole  40 mg Oral Daily  . sodium chloride flush  3 mL Intravenous Q12H  . tamsulosin  0.4 mg Oral QPC supper   Continuous Infusions: . sodium chloride     PRN Meds: sodium chloride, acetaminophen **OR** acetaminophen, ondansetron (ZOFRAN) IV, senna-docusate, sodium chloride flush   Vital Signs    Vitals:   02/20/17 2115 02/20/17 2200 02/20/17 2318 02/21/17 0352  BP: 129/61 (!) 126/57 (!) 116/53 131/61  Pulse: 67 61 (!) 57 (!) 55  Resp: 18 20 17 18   Temp:   97.8 F (36.6 C) 98 F (36.7 C)  TempSrc:   Oral Oral  SpO2: 94% 94% 92% 94%  Weight:    158 lb 11.7 oz (72 kg)  Height:        Intake/Output Summary (Last 24 hours) at 02/21/2017 0812 Last data filed at 02/21/2017 0600 Gross per 24 hour  Intake 712.67 ml  Output 900 ml  Net -187.33 ml   Filed Weights   02/19/17 1051 02/20/17 0426 02/21/17 0352  Weight: 160 lb (72.6 kg) 159 lb 9.8 oz (72.4 kg) 158 lb 11.7 oz (72 kg)    Telemetry    Sinus rhythm,PVC, PACs,  7 beats of NSVT.  No events- Personally Reviewed  ECG    n/a - Personally Reviewed  Physical Exam   VS:  BP 131/61 (BP Location: Right Arm)   Pulse (!) 55   Temp 98 F (36.7 C) (Oral)   Resp 18   Ht 5\' 7"  (1.702 m)   Wt 158 lb 11.7 oz (72 kg)   SpO2 94%   BMI 24.86 kg/m  , BMI Body mass index is 24.86 kg/m. GENERAL:  Well appearing.   Confused this morning.   HEENT: Pupils equal round and reactive, fundi not visualized, oral mucosa unremarkable NECK:  No jugular venous distention, waveform within normal limits, carotid upstroke brisk and symmetric, no bruits, no thyromegaly LUNGS:  Clear to auscultation bilaterally HEART:  RRR.  PMI not displaced or sustained,S1 and S2 within normal limits, no S3, no S4, no clicks, no rubs, no murmurs ABD:  Flat, positive bowel sounds normal in frequency in pitch, no bruits, no rebound, no guarding, no midline pulsatile mass, no hepatomegaly, no splenomegaly EXT:  2 plus pulses throughout, no edema, no cyanosis no clubbing SKIN:  No rashes no nodules NEURO:  Cranial nerves II through XII grossly intact, motor grossly intact throughout PSYCH:  Cognitively intact, oriented to person place and time   Labs    Chemistry Recent Labs  Lab 02/19/17 1102 02/20/17 0236 02/21/17 0222  NA 138 138 135  K 3.9 3.9 3.8  CL 103 101 101  CO2 25 26 24   GLUCOSE 168* 187* 284*  BUN 21* 16 21*  CREATININE 0.95 0.85 0.95  CALCIUM 9.3 9.5 9.0  PROT  --  7.0  --   ALBUMIN  --  3.4*  --   AST  --  28  --   ALT  --  31  --   ALKPHOS  --  60  --   BILITOT  --  1.0  --   GFRNONAA >60 >60 >60  GFRAA >60 >60 >60  ANIONGAP 10 11 10      Hematology Recent Labs  Lab 02/19/17 1102 02/20/17 0236  WBC 11.6* 9.7  RBC 4.57 4.33  HGB 13.9 13.1  HCT 42.9 40.7  MCV 93.9 94.0  MCH 30.4 30.3  MCHC 32.4 32.2  RDW 13.6 13.6  PLT 288 260    Cardiac Enzymes Recent Labs  Lab 02/19/17 1450 02/19/17 1949 02/20/17 0236  TROPONINI <0.03 <0.03 <0.03    Recent Labs  Lab 02/19/17 1112  TROPIPOC 0.01     BNPNo results for input(s): BNP, PROBNP in the last 168 hours.   DDimer No results for input(s): DDIMER in the last 168 hours.   Radiology    Dg Chest 2 View  Result Date: 02/19/2017 CLINICAL DATA:  Chest pain and shortness of breath. EXAM: CHEST  2 VIEW COMPARISON:  Chest x-ray dated March 08, 2015. FINDINGS: The cardiomediastinal silhouette is normal in size. Prior CABG. Normal pulmonary vascularity. Low lung volumes with bronchovascular crowding and bibasilar atelectasis. No focal consolidation, pleural effusion, or pneumothorax. No acute osseous abnormality. IMPRESSION: Low lung volumes with bibasilar atelectasis. Electronically Signed   By: Titus Dubin M.D.   On: 02/19/2017 11:43    Cardiac Studies   Carotid Dopplers 02/16/16: Right Carotid: There is evidence in the right ICA of a stable 1-39% stenosis.        Non-hemodynamically significant plaque <50% noted in the CCA. The        ECA appears >50% stenosed.  Left Carotid: There is evidence in the left ICA of a stable 1-39% stenosis.       Non-hemodynamically significant plaque noted in the CCA. The ECA       appears >50% stenosed.  LHC 03/09/15:  LM lesion, 70% stenosed.  Prox LAD lesion, 50% stenosed.  2nd Diag lesion, 70% stenosed.  Mid LAD lesion, 70% stenosed.  Dist LAD lesion, 100% stenosed.  Prox Cx lesion, 80% stenosed.  Mid Cx lesion, 80% stenosed.  Prox RCA lesion, 100% stenosed.  Dist Cx lesion, 95% stenosed.  SVG was injected is normal in caliber, and is anatomically normal.  Post Atrio lesion, 80% stenosed.  LIMA was injected is normal in caliber, and is anatomically normal.  SVG was injected is normal in caliber, and is anatomically normal.  The left ventricular systolic function is normal.   Normal LV function without focal segmental wall motion abnormalities.  Ejection fraction 55-60%.  LHC 02/20/17:  Prox RCA to Dist RCA lesion is 100% stenosed. SVG-rPDA is large and anatomically normal. Retrograde fills diffusely diseased RPL system.  Ost LAD to Prox LAD lesion is 80% stenosed. Prox LAD to Mid LAD lesion is 85% stenosed. Prox LAD lesion is 100% stenosed.  Ost 1st Diag lesion is 70% stenosed. Ost 2nd Diag to 2nd Diag lesion is 70% stenosed.  Ost  Cx to Dist Cx lesion is 80% stenosed.  Dist Cx lesion is 100% stenosed.  Ost 3rd Mrg lesion is 100% stenosed.  Seq SVG- OM3-OM4 is large in caliber and has minimal disease.  The left ventricular systolic  function is normal. The left ventricular ejection fraction is 55-65% by visual estimate.  LV end diastolic pressure is moderately elevated.  There is no mitral valve regurgitation.  There is no aortic valve stenosis.   Severe diffuse native coronary artery disease with no obvious objective change from last catheterization.  The remaining vessels are extremely small and targets.  No obvious culprit lesion to explain the patient's change in symptoms.  Suspect microvascular disease.   Patient Profile     Mr. Schools is a 59M with CAD s/p CABG (LIMA-->LAD, SVG-->OM, OMS, SVG-->PDA in 2009, hypertension, hyperlipidemia, carotid stenosis, diabetes, and GERD here with unstable angina.  Assessment & Plan    # Unstable angina: # CAD s/p CABG: # Hyperlipidemia:  Cardiac catheterization yesterday revealed severe native coronary artery disease with no changes from his last catheterization.  It is suspected that he may have microvascular ischemia.  We will add Imdur 60 mg daily.  He will need to ambulate to ensure that he is not having any chest pain.   LDL was 86 this admission.  His goal is less than 70.  Zetia 10 mg daily was added to his regimen given his intolerance to higher doses of statins.  Continue atorvastatin 10 mg daily and repeat lipids and CMP in 6 weeks.  Continue aspirin and amlodipine.  He is not on a beta-blocker due to bradycardia.  # Hypertension: BP goal is <130/80.  During his hospitalization [name] tends blood pressure was frequently above this goal.  This morning he reports that he sometimes has low blood pressures at home and is hesitant to increase his regimen.  We will reduce lisinopril back to 20 mg daily.  However we are adding Imdur as above.  Continue amlodipine,  hydrochlorothiazide, and lisinopril.  # Carotid stenosis: Continue aspirin and addressing lipids as above.   # Confusion: Mr. Debella is confused this AM.  While asking him about his BP he kept reporting his blood glucose levels.  It took four times of redirecting him in order to get him to answer appropriately.  This is unlike our prior interactions.  Unclear if there may be some sundowning.  The only sedating medications he has received were benadryl at 3pm on 1/8 and fentalyl/versed at 5pm.  It is possible these are still causing some confusion.   We will arrange follow up for him in clinic.    For questions or updates, please contact [place] Please consult [email or web address] for contact info under Cardiology/STEMI.      Signed, Skeet Latch, MD  02/21/2017, 8:12 AM

## 2017-02-21 NOTE — Progress Notes (Signed)
Patient IV removed, belongings and discharge instructions packed, and patient wheeled to front entrance.

## 2017-02-21 NOTE — Progress Notes (Signed)
   Subjective: Patient doing well this morning.  He is wondering what can be done about his exertional dyspnea.  Anxious to go home.  Discussed that we will wait to see if cardiology wants to make any adjustments to his current medication regiment.  After they have evaluated him, we discussed the patient will likely be able to go home today.  He will follow-up with Dr. Johnsie Cancel as an outpatient.  All questions and concerns addressed.  Objective: Vital signs in last 24 hours: Vitals:   02/20/17 2115 02/20/17 2200 02/20/17 2318 02/21/17 0352  BP: 129/61 (!) 126/57 (!) 116/53 131/61  Pulse: 67 61 (!) 57 (!) 55  Resp: 18 20 17 18   Temp:   97.8 F (36.6 C) 98 F (36.7 C)  TempSrc:   Oral Oral  SpO2: 94% 94% 92% 94%  Weight:    158 lb 11.7 oz (72 kg)  Height:       General: Well-nourished male in no acute distress Pulm: Good air movement with no crackles or wheezing CV: Regular rate and rhythm, no murmurs, no rubs Abdomen: Active bowel sounds, soft, nondistended Extremities: No lower extremity edema  Assessment/Plan:  Shortness of Breath  - Patient presented with 3 weeks of exertional dyspnea, palpitations, and chest pressure.  - Heart cath done on 1/8 illustrating severe diffuse native coronary artery disease with no obvious subjective changes since last catheterization in 2017. Suspect microvascular disease. Widely patent grafts. Recommending medical management and titration of medications. - Unfortunately the patient's resting bradycardia makes it unlikely that he would tolerate a beta-blocker. Cardiology considering adding on Ranexa  Diabetes Mellitus 2/2 pancreatectomy  - Patient's blood sugars have been elevated since admission.  - A1c 6.5% - Lantus 10 units QHS + SSI  Hypertension  - Goal <130/80  - Continue Amlodipine 10 mg, Lisinopril 40 mg, and HCTZ 12.5 mg  Hyperlipidemia  - LDL 83  - Continue Atorvastatin 10 mg and ezetimibe 10 mg  Dispo: Anticipated discharge in  approximately 0-1 day(s).   Ina Homes, MD 02/21/2017, 5:37 AM My Pager: [phone]

## 2017-02-21 NOTE — Discharge Instructions (Signed)
Thank you for allowing [place] to provide your care.   We have sent your new prescriptions to your pharmacy. Please pick them up as soon as possible.   Please follow-up with Dr. Johnsie Cancel.   Please follow-up with your primary care doctor as soon as possible.

## 2017-02-26 ENCOUNTER — Telehealth: Payer: Self-pay | Admitting: Cardiovascular Disease

## 2017-02-26 NOTE — Telephone Encounter (Signed)
Patient complaining of a racing heart, elevated BP, and severe headache. Patient's stated his BP is 190/80 and HR 85. Patient stated he has been taking Nitroglycerin every 2 hours. Informed patient that he should not be taking Nitro every 2 hours, that it is not meant to be taken like that. Informed patient he should be taking the imdur. Patient stated he cannot handle the imdur and he is not taking it. Informed patient that the nitro is going to lower his HR and he cannot keep taking it every 2 hours. Patient sounds SOB and encouraged patient to go to the ED. Patient is refusing and asked for an earlier appointment with our office. Made patient an appointment with on of our PA's this Thursday, but told patient he should go to the ED. Patient is adamant about not going to the hospital. Patient was told to go to the ED last week by Dr. Johnsie Cancel. Patient went to ED and ended up getting a heart cath. Will forward to Dr. Johnsie Cancel for further advisement.

## 2017-02-26 NOTE — Telephone Encounter (Signed)
New message      Pt c/o medication issue:  1. Name of Medication:  nitroGLYCERIN (NITROSTAT) 0.4 MG SL tablet Place 1 tablet (0.4 mg total) under the tongue every 5 (five) minutes as needed for chest pain.     2. How are you currently taking this medication (dosage and times per day)? Every 2 hours   3. Are you having a reaction (difficulty breathing--STAT)? Sounds breathless   4. What is your medication issue?  Severe headache, chest pounding , bp 190/85  Hr 85

## 2017-02-28 ENCOUNTER — Other Ambulatory Visit: Payer: Self-pay | Admitting: Gastroenterology

## 2017-02-28 DIAGNOSIS — R1013 Epigastric pain: Secondary | ICD-10-CM | POA: Diagnosis not present

## 2017-02-28 DIAGNOSIS — K219 Gastro-esophageal reflux disease without esophagitis: Secondary | ICD-10-CM | POA: Diagnosis not present

## 2017-03-01 ENCOUNTER — Ambulatory Visit: Payer: PPO | Admitting: Physician Assistant

## 2017-03-01 DIAGNOSIS — N401 Enlarged prostate with lower urinary tract symptoms: Secondary | ICD-10-CM | POA: Diagnosis not present

## 2017-03-01 DIAGNOSIS — R3912 Poor urinary stream: Secondary | ICD-10-CM | POA: Diagnosis not present

## 2017-03-07 ENCOUNTER — Ambulatory Visit
Admission: RE | Admit: 2017-03-07 | Discharge: 2017-03-07 | Disposition: A | Discharge status: Home / Self Care | Payer: PPO | Source: Ambulatory Visit | Attending: Gastroenterology | Admitting: Gastroenterology

## 2017-03-07 DIAGNOSIS — R1013 Epigastric pain: Secondary | ICD-10-CM

## 2017-03-07 DIAGNOSIS — R109 Unspecified abdominal pain: Secondary | ICD-10-CM | POA: Diagnosis not present

## 2017-03-07 MED ORDER — IOPAMIDOL (ISOVUE-300) INJECTION 61%
100.0000 mL | Freq: Once | INTRAVENOUS | Status: AC | PRN
  Administered 2017-03-07: 100 mL via INTRAVENOUS

## 2017-03-12 ENCOUNTER — Ambulatory Visit: Payer: PPO | Admitting: Cardiology

## 2017-03-13 DIAGNOSIS — R1013 Epigastric pain: Secondary | ICD-10-CM | POA: Diagnosis not present

## 2017-03-14 DIAGNOSIS — Z8679 Personal history of other diseases of the circulatory system: Secondary | ICD-10-CM | POA: Diagnosis not present

## 2017-03-14 DIAGNOSIS — R1013 Epigastric pain: Secondary | ICD-10-CM | POA: Diagnosis not present

## 2017-03-14 DIAGNOSIS — K219 Gastro-esophageal reflux disease without esophagitis: Secondary | ICD-10-CM | POA: Diagnosis not present

## 2017-03-19 NOTE — Progress Notes (Signed)
Cardiology Office Note   Date:  03/26/2017   ID:  Ricardo Le, DOB [birth year], MRN [id number]  PCP:  Angelina Sheriff, MD  Cardiologist:  Dr. Johnsie Cancel  No chief complaint on file.     History of Present Illness: Ricardo Le is a 77 y.o. male who presents for a follow up visit regarding  CAD and PAD. He has known history of coronary artery disease status post coronary artery bypass grafting in 2009 . He presented in 02/2015 with chest pain. Cardiac cath showed patent grafts. Also with PVD followed by Dr [name] Left CIA stent March 2016 with known bilateral SFT occlusions and 1 vessel run off. 11/14/16 right ABI .78 left .79    He is a pancreatic cancer survivor   He has history of intolerance to statins due to myalgia.  Previous lumber  spine surgery for neuropathic pain   Last cath 02/20/17 with patent grafts diffuse small vessel disease ? Microvascular angina LIMA LAD patent SVG OM1/OM2 patent SVG PDA patent   Called office 02/26/17 with elevated BP racing heart and taking a lot of SL nitro. Unable to take imdur Refused to go to ER Turns out pain is GI Had EGD with [facility] concern for recurrent pancreatic cancer ? In stomach Waiting on biopsy and being referred back to [facility]   Past Medical History:  Diagnosis Date  . CAD (coronary artery disease)    a. 04/2007 s/p CABG x 4  - LIMA->LAD, VG->OM1->Om2, VG->PDA;  b. 12/2008 & 06/2010 Caths - Native multivessel dzs with 4/4 patent grafts. c. cath 03/09/2015 patent grafts, unchanged from prior cath.  . Cancer [facility])    pancreatic  March 2011  . Carotid bruit    a. 07/2010 U/S- 0-39% bilat ICA stenosis  . Chest pain    Noncardiac probably related to reflux  . DDD (degenerative disc disease)    several surgeries  . GERD (gastroesophageal reflux disease)   . HNP (herniated nucleus pulposus), lumbar    a. L2-3, s/p laminotomy, microdiskectomy 02/2009  . HOH (hard of hearing)    uses amplifiers   . Nephrolithiasis   . PAD  (peripheral artery disease) ([place]) 04/2014   s/p L Common Iliac Stent  . Palpitations   . 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 Pneumonia    hx of  pna  . Pure hypercholesterolemia    statin intolerance  . Type II or unspecified type diabetes mellitus without mention of complication, not stated as uncontrolled    insulin dependent  . Unspecified essential hypertension     Past Surgical History:  Procedure Laterality Date  . ABDOMINAL AORTAGRAM N/A 05/13/2014   Procedure: ABDOMINAL [name];  Surgeon: Wellington Hampshire, MD;  Location: [place] CATH LAB;  Service: Cardiovascular;  Laterality: N/A;  . ANTERIOR CERVICAL DECOMP/DISCECTOMY FUSION N/A 07/28/2014   Procedure: ANTERIOR CERVICAL DECOMPRESSION/DISCECTOMY FUSION CERVICAL FOUR-FIVE CERVICAL FIVE-SIX ;  Surgeon: Earnie Larsson, MD;  Location: [place] NEURO ORS;  Service: Neurosurgery;  Laterality: N/A;  . APPENDECTOMY    . arthroscopy right knee    . BACK SURGERY    . CARDIAC CATHETERIZATION N/A 03/09/2015   Procedure: Left Heart Cath and Cors/Grafts Angiography;  Surgeon: [name] Sine, MD;  Location: [place] CV LAB;  Service: Cardiovascular;  Laterality: N/A;  . CHOLECYSTECTOMY    . CORONARY ARTERY BYPASS GRAFT     X 4 2009 (LIMA to LAD, SVG  to first cicumflex marginal branch with sequential SVG to second cicumflex marginal branch  and saphenous vein to posterior descending coronary artery with endoscopic,vein harvest rt. lower exttremity by Dr.Owen on March 31,2009  . CYST REMOVED     FROM SPINE  . CYSTOURETHROSCOPY     right retrograde pyelogram,manipulate stone in the renal pelvis, rt. double-j catheter.  [name] Kitchen EYE SURGERY    . KNEE SURGERY     rt knee  . LEFT HEART CATH AND CORS/GRAFTS ANGIOGRAPHY N/A 02/20/2017   Procedure: LEFT HEART CATH AND CORS/GRAFTS ANGIOGRAPHY;  Surgeon: Leonie Man, MD;  Location: [place] CV LAB;  Service: Cardiovascular;  Laterality: N/A;  . NEPHROLITHOTOMY       PERCUTANEOUS  . PARS PLANA VITRECTOMY     lt. eye,retinal photocoagulation lt. eye, membrane peel lt. eye.  [name] Kitchen PERCUTANEOUS STENT INTERVENTION Left 05/13/2014   Procedure: PERCUTANEOUS STENT INTERVENTION;  Surgeon: Wellington Hampshire, MD;  Location: [place] CATH LAB;  Service: Cardiovascular;  Laterality: Left;  COMMON ILLIAC  . re-exploratin of laminectomy  05/2008   (RT L2-3) WITH REDO MICRODISKECTOMY     Current Outpatient Medications  Medication Sig Dispense Refill  . acetaminophen (TYLENOL) 325 MG tablet Take 650 mg by mouth every 6 (six) hours as needed (pain).    [name] Kitchen amLODipine (NORVASC) 10 MG tablet Take 10 mg by mouth daily.    [name] Kitchen aspirin EC 81 MG tablet Take 81 mg by mouth at bedtime.    [name] Kitchen atorvastatin (LIPITOR) 10 MG tablet TAKE ONE TABLET BY MOUTH DAILY 90 tablet 3  . ezetimibe (ZETIA) 10 MG tablet Take 1 tablet (10 mg total) by mouth at bedtime. 30 tablet 1  . glipiZIDE (GLUCOTROL) 10 MG tablet Take 10 mg by mouth 2 (two) times daily.     . insulin glargine (LANTUS SOLOSTAR) 100 UNIT/ML injection Inject 10 Units into the skin at bedtime.     . isosorbide mononitrate (IMDUR) 60 MG 24 hr tablet Take 1 tablet (60 mg total) by mouth daily. 30 tablet 1  . lisinopril-hydrochlorothiazide (PRINZIDE,ZESTORETIC) 20-12.5 MG per tablet Take 1 tablet by mouth daily.      . Multiple Vitamin (MULTIVITAMIN WITH MINERALS) TABS tablet Take 1 tablet by mouth daily.    . nitroGLYCERIN (NITROSTAT) 0.4 MG SL tablet Place 1 tablet (0.4 mg total) under the tongue every 5 (five) minutes as needed for chest pain. 25 tablet 3  . omeprazole (PRILOSEC) 20 MG capsule Take 20 mg by mouth daily.    . tamsulosin (FLOMAX) 0.4 MG CAPS capsule Take 0.4 mg by mouth daily after supper.     No current facility-administered medications for this visit.     Allergies:   Rosuvastatin calcium; Iohexol; and Statins    Social History:  The patient  reports that  has never smoked. he has never used smokeless tobacco. He reports  that he does not drink alcohol or use drugs.   Family History:  The patient's family history includes Coronary artery disease in his brother and sister; Diabetes in his brother; Heart attack in his father; Other in his mother and unknown relative.    ROS:  Please see the history of present illness.   Otherwise, review of systems are positive for none.   All other systems are reviewed and negative.    PHYSICAL EXAM: VS:  BP (!) 146/68   Pulse 63   Ht 5\' 7"  (1.702 m)   Wt 162 lb 4 oz (73.6 kg)   SpO2 95%  BMI 25.41 kg/m  , BMI Body mass index is 25.41 kg/m. Affect appropriate Healthy:  appears stated age 71: normal Neck supple with no adenopathy JVP normal bilateral  bruits no thyromegaly Lungs clear with no wheezing and good diaphragmatic motion Heart:  S1/S2 no murmur, no rub, gallop or click PMI normal Abdomen: benighn, BS positve, no tenderness, no AAA Femoral bruist.  No HSM or HJR Distal pulses decreased below knees  No edema Neuro non-focal Skin warm and dry No muscular weakness   EKG:  10/23/16 SR rate 62 PVC old IMI LAD     Recent Labs: 02/20/2017: ALT 31; Hemoglobin 13.1; Platelets 260 02/21/2017: BUN 21; Creatinine, Ser 0.95; Potassium 3.8; Sodium 135    Lipid Panel    Component Value Date/Time   CHOL 138 02/21/2017 0222   TRIG 44 02/21/2017 0222   HDL 43 02/21/2017 0222   CHOLHDL 3.2 02/21/2017 0222   VLDL 9 02/21/2017 0222   LDLCALC 86 02/21/2017 0222      Wt Readings from Last 3 Encounters:  03/26/17 162 lb 4 oz (73.6 kg)  02/21/17 158 lb 11.7 oz (72 kg)  11/14/16 162 lb 12.8 oz (73.8 kg)         ASSESSMENT AND PLAN:  1.  Peripheral arterial disease: Status post left iliac artery stenting. He is known to have bilateral SFA occlusion with stable mild bilateral calf claudication. Continue medical therapy. ABI's decreased in mild range at rest   2. Coronary artery disease cath 02/20/17 patient grafts small vessel disease no angina medical Rx     3. Hyperlipidemia: Reported intolerance to statins but hard to differentiate from claudication    4. Essential hypertension:  Well controlled.  Continue current medications and low sodium Dash type diet.    5. Bilateral carotid disease: Most recent duplex on 02/15/17  showed mild atherosclerosis.  6. Pancreatic cancer - recent chest pain GI in nature with recent EGD ? Recurrent cancer Biopsy pending [facility] to arrange referral back to [facility]

## 2017-03-22 DIAGNOSIS — K219 Gastro-esophageal reflux disease without esophagitis: Secondary | ICD-10-CM | POA: Diagnosis not present

## 2017-03-22 DIAGNOSIS — K2930 Chronic superficial gastritis without bleeding: Secondary | ICD-10-CM | POA: Diagnosis not present

## 2017-03-22 DIAGNOSIS — K317 Polyp of stomach and duodenum: Secondary | ICD-10-CM | POA: Diagnosis not present

## 2017-03-26 ENCOUNTER — Encounter: Payer: Self-pay | Admitting: Cardiovascular Disease

## 2017-03-26 ENCOUNTER — Ambulatory Visit: Payer: PPO | Admitting: Cardiovascular Disease

## 2017-03-26 ENCOUNTER — Encounter (INDEPENDENT_AMBULATORY_CARE_PROVIDER_SITE_OTHER): Payer: Self-pay

## 2017-03-26 VITALS — BP 146/68 | HR 63 | Ht 67.0 in | Wt 162.2 lb

## 2017-03-26 DIAGNOSIS — I2510 Atherosclerotic heart disease of native coronary artery without angina pectoris: Secondary | ICD-10-CM

## 2017-03-26 NOTE — Patient Instructions (Signed)

## 2017-03-28 DIAGNOSIS — K317 Polyp of stomach and duodenum: Secondary | ICD-10-CM | POA: Diagnosis not present

## 2017-03-28 DIAGNOSIS — K2930 Chronic superficial gastritis without bleeding: Secondary | ICD-10-CM | POA: Diagnosis not present

## 2017-04-30 DIAGNOSIS — R1013 Epigastric pain: Secondary | ICD-10-CM | POA: Diagnosis not present

## 2017-04-30 DIAGNOSIS — K5909 Other constipation: Secondary | ICD-10-CM | POA: Diagnosis not present

## 2017-04-30 DIAGNOSIS — K219 Gastro-esophageal reflux disease without esophagitis: Secondary | ICD-10-CM | POA: Diagnosis not present

## 2017-07-20 ENCOUNTER — Other Ambulatory Visit: Payer: Self-pay

## 2017-07-20 DIAGNOSIS — I739 Peripheral vascular disease, unspecified: Secondary | ICD-10-CM

## 2017-07-31 DIAGNOSIS — Z1211 Encounter for screening for malignant neoplasm of colon: Secondary | ICD-10-CM | POA: Diagnosis not present

## 2017-07-31 DIAGNOSIS — K5909 Other constipation: Secondary | ICD-10-CM | POA: Diagnosis not present

## 2017-07-31 DIAGNOSIS — R1013 Epigastric pain: Secondary | ICD-10-CM | POA: Diagnosis not present

## 2017-07-31 DIAGNOSIS — K219 Gastro-esophageal reflux disease without esophagitis: Secondary | ICD-10-CM | POA: Diagnosis not present

## 2017-08-01 ENCOUNTER — Encounter (INDEPENDENT_AMBULATORY_CARE_PROVIDER_SITE_OTHER): Payer: PPO | Admitting: Ophthalmology

## 2017-08-01 DIAGNOSIS — H26491 Other secondary cataract, right eye: Secondary | ICD-10-CM

## 2017-08-01 DIAGNOSIS — E11319 Type 2 diabetes mellitus with unspecified diabetic retinopathy without macular edema: Secondary | ICD-10-CM

## 2017-08-01 DIAGNOSIS — E113593 Type 2 diabetes mellitus with proliferative diabetic retinopathy without macular edema, bilateral: Secondary | ICD-10-CM | POA: Diagnosis not present

## 2017-08-01 DIAGNOSIS — H35033 Hypertensive retinopathy, bilateral: Secondary | ICD-10-CM

## 2017-08-01 DIAGNOSIS — I10 Essential (primary) hypertension: Secondary | ICD-10-CM | POA: Diagnosis not present

## 2017-08-08 ENCOUNTER — Encounter (INDEPENDENT_AMBULATORY_CARE_PROVIDER_SITE_OTHER): Payer: PPO | Admitting: Ophthalmology

## 2017-08-08 DIAGNOSIS — H2701 Aphakia, right eye: Secondary | ICD-10-CM

## 2017-08-13 DIAGNOSIS — I10 Essential (primary) hypertension: Secondary | ICD-10-CM | POA: Diagnosis not present

## 2017-08-13 DIAGNOSIS — Z6826 Body mass index (BMI) 26.0-26.9, adult: Secondary | ICD-10-CM | POA: Diagnosis not present

## 2017-08-13 DIAGNOSIS — K5909 Other constipation: Secondary | ICD-10-CM | POA: Diagnosis not present

## 2017-08-13 DIAGNOSIS — R5383 Other fatigue: Secondary | ICD-10-CM | POA: Diagnosis not present

## 2017-08-13 DIAGNOSIS — E785 Hyperlipidemia, unspecified: Secondary | ICD-10-CM | POA: Diagnosis not present

## 2017-08-13 DIAGNOSIS — E1165 Type 2 diabetes mellitus with hyperglycemia: Secondary | ICD-10-CM | POA: Diagnosis not present

## 2017-08-13 DIAGNOSIS — Z1339 Encounter for screening examination for other mental health and behavioral disorders: Secondary | ICD-10-CM | POA: Diagnosis not present

## 2017-08-17 DIAGNOSIS — D510 Vitamin B12 deficiency anemia due to intrinsic factor deficiency: Secondary | ICD-10-CM | POA: Diagnosis not present

## 2017-08-24 DIAGNOSIS — D510 Vitamin B12 deficiency anemia due to intrinsic factor deficiency: Secondary | ICD-10-CM | POA: Diagnosis not present

## 2017-08-31 DIAGNOSIS — D510 Vitamin B12 deficiency anemia due to intrinsic factor deficiency: Secondary | ICD-10-CM | POA: Diagnosis not present

## 2017-09-13 DIAGNOSIS — D225 Melanocytic nevi of trunk: Secondary | ICD-10-CM | POA: Diagnosis not present

## 2017-09-13 DIAGNOSIS — L821 Other seborrheic keratosis: Secondary | ICD-10-CM | POA: Diagnosis not present

## 2017-09-13 DIAGNOSIS — C4401 Basal cell carcinoma of skin of lip: Secondary | ICD-10-CM | POA: Diagnosis not present

## 2017-09-14 DIAGNOSIS — D510 Vitamin B12 deficiency anemia due to intrinsic factor deficiency: Secondary | ICD-10-CM | POA: Diagnosis not present

## 2017-09-24 DIAGNOSIS — Z1212 Encounter for screening for malignant neoplasm of rectum: Secondary | ICD-10-CM | POA: Diagnosis not present

## 2017-09-24 DIAGNOSIS — Z1211 Encounter for screening for malignant neoplasm of colon: Secondary | ICD-10-CM | POA: Diagnosis not present

## 2017-09-25 DIAGNOSIS — I10 Essential (primary) hypertension: Secondary | ICD-10-CM | POA: Diagnosis not present

## 2017-09-25 DIAGNOSIS — Z79899 Other long term (current) drug therapy: Secondary | ICD-10-CM | POA: Diagnosis not present

## 2017-09-25 DIAGNOSIS — Z6825 Body mass index (BMI) 25.0-25.9, adult: Secondary | ICD-10-CM | POA: Diagnosis not present

## 2017-09-25 DIAGNOSIS — E1143 Type 2 diabetes mellitus with diabetic autonomic (poly)neuropathy: Secondary | ICD-10-CM | POA: Diagnosis not present

## 2017-09-25 DIAGNOSIS — E785 Hyperlipidemia, unspecified: Secondary | ICD-10-CM | POA: Diagnosis not present

## 2017-09-25 DIAGNOSIS — Z0000 Encounter for general adult medical examination without abnormal findings: Secondary | ICD-10-CM | POA: Diagnosis not present

## 2017-09-25 DIAGNOSIS — R0989 Other specified symptoms and signs involving the circulatory and respiratory systems: Secondary | ICD-10-CM | POA: Diagnosis not present

## 2017-09-25 DIAGNOSIS — R300 Dysuria: Secondary | ICD-10-CM | POA: Diagnosis not present

## 2017-10-09 ENCOUNTER — Encounter: Payer: Self-pay | Admitting: Cardiovascular Disease

## 2017-10-09 ENCOUNTER — Ambulatory Visit (INDEPENDENT_AMBULATORY_CARE_PROVIDER_SITE_OTHER): Payer: PPO | Admitting: Cardiovascular Disease

## 2017-10-09 VITALS — BP 142/59 | HR 62 | Ht 68.0 in | Wt 161.0 lb

## 2017-10-09 DIAGNOSIS — I2510 Atherosclerotic heart disease of native coronary artery without angina pectoris: Secondary | ICD-10-CM

## 2017-10-09 DIAGNOSIS — I779 Disorder of arteries and arterioles, unspecified: Secondary | ICD-10-CM

## 2017-10-09 DIAGNOSIS — E782 Mixed hyperlipidemia: Secondary | ICD-10-CM

## 2017-10-09 DIAGNOSIS — I739 Peripheral vascular disease, unspecified: Secondary | ICD-10-CM

## 2017-10-09 DIAGNOSIS — I10 Essential (primary) hypertension: Secondary | ICD-10-CM

## 2017-10-09 NOTE — Progress Notes (Signed)
Cardiology Office Note   Date:  10/09/2017   ID:  Ricardo Le, DOB 25-May-1940, MRN [id number]  PCP:  Angelina Sheriff, MD  Cardiologist:  Dr. Johnsie Cancel  No chief complaint on file.     History of Present Illness: Ricardo Le is a 77 y.o. male who presents for a follow up visit regarding PAD. He has known history of coronary artery disease status post coronary artery bypass grafting in 2009 . He presented in 02/2015 with chest pain. Cardiac cath showed patent grafts.  Other past medical history include remote history of pancreatic cancer which was treated successfully. He is status post left common iliac artery stent placement in March 2016. He is known to have bilateral SFA occlusion with 1 vessel runoff below the knee bilaterally. He had previous neck surgery in 2016 with improvement in his neuropathic pain.  He was hospitalized in February with chest pain.  He underwent cardiac catheterization which showed patent grafts.  He has been doing reasonably well since then.  He describes mild bilateral leg pain with walking.  He is able to walk 1 mile on the treadmill.  Past Medical History:  Diagnosis Date  . CAD (coronary artery disease)    a. 04/2007 s/p CABG x 4  - LIMA->LAD, VG->OM1->Om2, VG->PDA;  b. 12/2008 & 06/2010 Caths - Native multivessel dzs with 4/4 patent grafts. c. cath 03/09/2015 patent grafts, unchanged from prior cath.  . Cancer [facility])    pancreatic  March 2011  . Carotid bruit    a. 07/2010 U/S- 0-39% bilat ICA stenosis  . Chest pain    Noncardiac probably related to reflux  . DDD (degenerative disc disease)    several surgeries  . GERD (gastroesophageal reflux disease)   . HNP (herniated nucleus pulposus), lumbar    a. L2-3, s/p laminotomy, microdiskectomy 02/2009  . HOH (hard of hearing)    uses amplifiers   . Nephrolithiasis   . PAD (peripheral artery disease) ([place]) 04/2014   s/p L Common Iliac Stent  . Palpitations   . Pancreatic mass    a. Tail  mass s/p lap dist pancreatectomy & splenectomy 06/2009  . Pneumonia    hx of  pna  . Pure hypercholesterolemia    statin intolerance  . Type II or unspecified type diabetes mellitus without mention of complication, not stated as uncontrolled    insulin dependent  . Unspecified essential hypertension     Past Surgical History:  Procedure Laterality Date  . ABDOMINAL AORTAGRAM N/A 05/13/2014   Procedure: ABDOMINAL [name];  Surgeon: Wellington Hampshire, MD;  Location: [place] CATH LAB;  Service: Cardiovascular;  Laterality: N/A;  . ANTERIOR CERVICAL DECOMP/DISCECTOMY FUSION N/A 07/28/2014   Procedure: ANTERIOR CERVICAL DECOMPRESSION/DISCECTOMY FUSION CERVICAL FOUR-FIVE CERVICAL FIVE-SIX ;  Surgeon: Earnie Larsson, MD;  Location: [place] NEURO ORS;  Service: Neurosurgery;  Laterality: N/A;  . APPENDECTOMY    . arthroscopy right knee    . BACK SURGERY    . CARDIAC CATHETERIZATION N/A 03/09/2015   Procedure: Left Heart Cath and Cors/Grafts Angiography;  Surgeon: [name] Sine, MD;  Location: [place] CV LAB;  Service: Cardiovascular;  Laterality: N/A;  . CHOLECYSTECTOMY    . CORONARY ARTERY BYPASS GRAFT     X 4 2009 (LIMA to LAD, SVG to first cicumflex marginal branch with sequential SVG to second cicumflex marginal branch  and saphenous vein to posterior descending coronary artery with endoscopic,vein harvest rt. lower exttremity by Dr.Owen on March 31,2009  .  CYST REMOVED     FROM SPINE  . CYSTOURETHROSCOPY     right retrograde pyelogram,manipulate stone in the renal pelvis, rt. double-j catheter.  [name] Kitchen EYE SURGERY    . KNEE SURGERY     rt knee  . LEFT HEART CATH AND CORS/GRAFTS ANGIOGRAPHY N/A 02/20/2017   Procedure: LEFT HEART CATH AND CORS/GRAFTS ANGIOGRAPHY;  Surgeon: Leonie Man, MD;  Location: [place] CV LAB;  Service: Cardiovascular;  Laterality: N/A;  . NEPHROLITHOTOMY     PERCUTANEOUS  . PARS PLANA VITRECTOMY     lt. eye,retinal photocoagulation lt. eye, membrane peel lt. eye.  [name] Kitchen  PERCUTANEOUS STENT INTERVENTION Left 05/13/2014   Procedure: PERCUTANEOUS STENT INTERVENTION;  Surgeon: Wellington Hampshire, MD;  Location: [place] CATH LAB;  Service: Cardiovascular;  Laterality: Left;  COMMON ILLIAC  . re-exploratin of laminectomy  05/2008   (RT L2-3) WITH REDO MICRODISKECTOMY     Current Outpatient Medications  Medication Sig Dispense Refill  . acetaminophen (TYLENOL) 325 MG tablet Take 650 mg by mouth every 6 (six) hours as needed (pain).    [name] Kitchen amLODipine (NORVASC) 10 MG tablet Take 10 mg by mouth daily.    [name] Kitchen aspirin EC 81 MG tablet Take 81 mg by mouth at bedtime.    [name] Kitchen atorvastatin (LIPITOR) 10 MG tablet TAKE ONE TABLET BY MOUTH DAILY 90 tablet 3  . ezetimibe (ZETIA) 10 MG tablet Take 1 tablet (10 mg total) by mouth at bedtime. 30 tablet 1  . glipiZIDE (GLUCOTROL) 10 MG tablet Take 10 mg by mouth 2 (two) times daily.     . insulin glargine (LANTUS SOLOSTAR) 100 UNIT/ML injection Inject 10 Units into the skin at bedtime.     . isosorbide mononitrate (IMDUR) 60 MG 24 hr tablet Take 1 tablet (60 mg total) by mouth daily. 30 tablet 1  . lisinopril-hydrochlorothiazide (PRINZIDE,ZESTORETIC) 20-12.5 MG per tablet Take 1 tablet by mouth daily.      . Multiple Vitamin (MULTIVITAMIN WITH MINERALS) TABS tablet Take 1 tablet by mouth daily.    . nitroGLYCERIN (NITROSTAT) 0.4 MG SL tablet Place 1 tablet (0.4 mg total) under the tongue every 5 (five) minutes as needed for chest pain. 25 tablet 3  . omeprazole (PRILOSEC) 20 MG capsule Take 20 mg by mouth daily.    . tamsulosin (FLOMAX) 0.4 MG CAPS capsule Take 0.4 mg by mouth daily after supper.     No current facility-administered medications for this visit.     Allergies:   Rosuvastatin calcium; Iohexol; and Statins    Social History:  The patient  reports that he has never smoked. He has never used smokeless tobacco. He reports that he does not drink alcohol or use drugs.   Family History:  The patient's family history includes Coronary  artery disease in his brother and sister; Diabetes in his brother; Heart attack in his father; Other in his mother and unknown relative.    ROS:  Please see the history of present illness.   Otherwise, review of systems are positive for none.   All other systems are reviewed and negative.    PHYSICAL EXAM: VS:  BP (!) 142/59 (BP Location: Right Arm, Patient Position: Sitting, Cuff Size: Normal)   Pulse 62   Ht 5\' 8"  (1.727 m)   Wt 161 lb (73 kg)   BMI 24.48 kg/m  , BMI Body mass index is 24.48 kg/m. GEN: Well nourished, well developed, in no acute distress  HEENT: normal  Neck: no JVD, or masses.  Bilateral carotid bruit.  Cardiac: RRR; no  rubs, or gallops,no edema . 2/6 SEM at the base.  Respiratory:  clear to auscultation bilaterally, normal work of breathing GI: soft, nontender, nondistended, + BS MS: no deformity or atrophy  Skin: warm and dry, no rash Neuro:  Strength and sensation are intact Psych: euthymic mood, full affect Vascular: Femoral pulses are normal. Distal pulses are not palpable.  EKG:  EKG is not ordered today.    Recent Labs: 02/20/2017: ALT 31; Hemoglobin 13.1; Platelets 260 02/21/2017: BUN 21; Creatinine, Ser 0.95; Potassium 3.8; Sodium 135    Lipid Panel    Component Value Date/Time   CHOL 138 02/21/2017 0222   TRIG 44 02/21/2017 0222   HDL 43 02/21/2017 0222   CHOLHDL 3.2 02/21/2017 0222   VLDL 9 02/21/2017 0222   LDLCALC 86 02/21/2017 0222      Wt Readings from Last 3 Encounters:  10/09/17 161 lb (73 kg)  03/26/17 162 lb 4 oz (73.6 kg)  02/21/17 158 lb 11.7 oz (72 kg)         ASSESSMENT AND PLAN:  1.  Peripheral arterial disease: Status post left iliac artery stenting. He is known to have bilateral SFA occlusion.  He has mild bilateral leg claudication which does not seem to be lifestyle limiting at the present time.  Continue medical therapy.  Repeat vascular studies in October of this year.   2. Coronary artery disease involving  native coronary arteries without angina:  Doing well with no anginal symptoms. Continue medical therapy.  Cardiac catheterization in February showed patent grafts.  3. Hyperlipidemia: Continue treatment with atorvastatin with a target LDL of less than 70.  4. Essential hypertension: Blood pressure is mildly elevated . Continue to monitor closely.  5. Bilateral carotid disease: Carotid Doppler in January of this year showed mild nonobstructive disease.  Given the presence of bilateral carotid bruits, I plan on repeating carotid Doppler in 1 to 2 years.     Disposition:   FU with me in 1 year  Signed, Kathlyn Sacramento, MD  10/09/2017 10:52 AM    [place]

## 2017-10-09 NOTE — Patient Instructions (Addendum)
Medication Instructions:  Your physician recommends that you continue on your current medications as directed. Please refer to the Current Medication list given to you today.  Labwork: none  Testing/Procedures: Your physician has requested that you have an ankle brachial index (ABI). During this test an ultrasound and blood pressure cuff are used to evaluate the arteries that supply the arms and legs with blood. Allow thirty minutes for this exam. There are no restrictions or special instructions.  Your physician has requested that you have a lower or upper extremity arterial duplex. This test is an ultrasound of the arteries in the legs or arms. It looks at arterial blood flow in the legs and arms. Allow one hour for Lower and Upper Arterial scans. There are no restrictions or special instructions  Your physician has requested that you have an aorta/iliac duplex. During this test, an ultrasound is used to evaluate blood flow to the aorta and iliac arteries. Allow one hour for this exam. Do not eat after midnight the day before and avoid carbonated beverages.    ALL OF THE ABOVE IN October   Follow-Up: Your physician wants you to follow-up in: 1 year  You will receive a reminder letter in the mail two months in advance. If you don't receive a letter, please call our office to schedule the follow-up appointment.  If you need a refill on your cardiac medications before your next appointment, please call your pharmacy.

## 2017-10-12 DIAGNOSIS — D510 Vitamin B12 deficiency anemia due to intrinsic factor deficiency: Secondary | ICD-10-CM | POA: Diagnosis not present

## 2017-10-31 DIAGNOSIS — Z23 Encounter for immunization: Secondary | ICD-10-CM | POA: Diagnosis not present

## 2017-10-31 DIAGNOSIS — Z6825 Body mass index (BMI) 25.0-25.9, adult: Secondary | ICD-10-CM | POA: Diagnosis not present

## 2017-10-31 DIAGNOSIS — N390 Urinary tract infection, site not specified: Secondary | ICD-10-CM | POA: Diagnosis not present

## 2017-11-06 DIAGNOSIS — R351 Nocturia: Secondary | ICD-10-CM | POA: Diagnosis not present

## 2017-11-06 DIAGNOSIS — R102 Pelvic and perineal pain: Secondary | ICD-10-CM | POA: Diagnosis not present

## 2017-11-06 DIAGNOSIS — N200 Calculus of kidney: Secondary | ICD-10-CM | POA: Diagnosis not present

## 2017-11-06 DIAGNOSIS — R972 Elevated prostate specific antigen [PSA]: Secondary | ICD-10-CM | POA: Diagnosis not present

## 2017-11-06 DIAGNOSIS — N401 Enlarged prostate with lower urinary tract symptoms: Secondary | ICD-10-CM | POA: Diagnosis not present

## 2017-11-09 DIAGNOSIS — Z6824 Body mass index (BMI) 24.0-24.9, adult: Secondary | ICD-10-CM | POA: Diagnosis not present

## 2017-11-09 DIAGNOSIS — D510 Vitamin B12 deficiency anemia due to intrinsic factor deficiency: Secondary | ICD-10-CM | POA: Diagnosis not present

## 2017-11-09 DIAGNOSIS — N419 Inflammatory disease of prostate, unspecified: Secondary | ICD-10-CM | POA: Diagnosis not present

## 2017-11-12 DIAGNOSIS — E785 Hyperlipidemia, unspecified: Secondary | ICD-10-CM | POA: Diagnosis not present

## 2017-11-12 DIAGNOSIS — E119 Type 2 diabetes mellitus without complications: Secondary | ICD-10-CM | POA: Diagnosis not present

## 2017-11-12 DIAGNOSIS — I10 Essential (primary) hypertension: Secondary | ICD-10-CM | POA: Diagnosis not present

## 2017-11-20 DIAGNOSIS — R350 Frequency of micturition: Secondary | ICD-10-CM | POA: Diagnosis not present

## 2017-11-20 DIAGNOSIS — R3912 Poor urinary stream: Secondary | ICD-10-CM | POA: Diagnosis not present

## 2017-11-20 DIAGNOSIS — N411 Chronic prostatitis: Secondary | ICD-10-CM | POA: Diagnosis not present

## 2017-11-20 DIAGNOSIS — N401 Enlarged prostate with lower urinary tract symptoms: Secondary | ICD-10-CM | POA: Diagnosis not present

## 2017-11-21 ENCOUNTER — Other Ambulatory Visit: Payer: Self-pay | Admitting: Cardiovascular Disease

## 2017-11-21 DIAGNOSIS — I739 Peripheral vascular disease, unspecified: Secondary | ICD-10-CM

## 2017-11-21 DIAGNOSIS — Z95820 Peripheral vascular angioplasty status with implants and grafts: Secondary | ICD-10-CM

## 2017-11-30 DIAGNOSIS — E1165 Type 2 diabetes mellitus with hyperglycemia: Secondary | ICD-10-CM | POA: Diagnosis not present

## 2017-11-30 DIAGNOSIS — Z6825 Body mass index (BMI) 25.0-25.9, adult: Secondary | ICD-10-CM | POA: Diagnosis not present

## 2017-11-30 DIAGNOSIS — M545 Low back pain: Secondary | ICD-10-CM | POA: Diagnosis not present

## 2017-12-03 ENCOUNTER — Ambulatory Visit (HOSPITAL_BASED_OUTPATIENT_CLINIC_OR_DEPARTMENT_OTHER)
Admission: RE | Admit: 2017-12-03 | Discharge: 2017-12-03 | Disposition: A | Discharge status: Home / Self Care | Payer: PPO | Source: Ambulatory Visit | Attending: Cardiovascular Disease | Admitting: Cardiovascular Disease

## 2017-12-03 ENCOUNTER — Ambulatory Visit (HOSPITAL_COMMUNITY)
Admission: RE | Admit: 2017-12-03 | Discharge: 2017-12-03 | Disposition: A | Discharge status: Home / Self Care | Payer: PPO | Source: Ambulatory Visit | Attending: Cardiology | Admitting: Cardiology

## 2017-12-03 DIAGNOSIS — I739 Peripheral vascular disease, unspecified: Secondary | ICD-10-CM

## 2017-12-03 DIAGNOSIS — Z95820 Peripheral vascular angioplasty status with implants and grafts: Secondary | ICD-10-CM | POA: Insufficient documentation

## 2017-12-04 ENCOUNTER — Telehealth: Payer: Self-pay | Admitting: *Deleted

## 2017-12-04 DIAGNOSIS — I739 Peripheral vascular disease, unspecified: Secondary | ICD-10-CM

## 2017-12-04 NOTE — Telephone Encounter (Signed)
-----   Message from Wellington Hampshire, MD sent at 12/03/2017  3:13 PM EDT ----- Stable ABI and patent left common iliac artery stent.  Repeat studies in 1 year.

## 2017-12-04 NOTE — Telephone Encounter (Signed)
Patient made aware of results and verbalized understanding. Repeat orders placed.    

## 2017-12-10 DIAGNOSIS — D510 Vitamin B12 deficiency anemia due to intrinsic factor deficiency: Secondary | ICD-10-CM | POA: Diagnosis not present

## 2017-12-13 DIAGNOSIS — E785 Hyperlipidemia, unspecified: Secondary | ICD-10-CM | POA: Diagnosis not present

## 2017-12-13 DIAGNOSIS — I10 Essential (primary) hypertension: Secondary | ICD-10-CM | POA: Diagnosis not present

## 2017-12-24 DIAGNOSIS — R102 Pelvic and perineal pain: Secondary | ICD-10-CM | POA: Diagnosis not present

## 2017-12-24 DIAGNOSIS — M549 Dorsalgia, unspecified: Secondary | ICD-10-CM | POA: Diagnosis not present

## 2017-12-24 DIAGNOSIS — G8929 Other chronic pain: Secondary | ICD-10-CM | POA: Diagnosis not present

## 2017-12-24 DIAGNOSIS — Z6825 Body mass index (BMI) 25.0-25.9, adult: Secondary | ICD-10-CM | POA: Diagnosis not present

## 2017-12-27 DIAGNOSIS — M545 Low back pain: Secondary | ICD-10-CM | POA: Diagnosis not present

## 2018-01-03 DIAGNOSIS — M47816 Spondylosis without myelopathy or radiculopathy, lumbar region: Secondary | ICD-10-CM | POA: Diagnosis not present

## 2018-01-03 DIAGNOSIS — M961 Postlaminectomy syndrome, not elsewhere classified: Secondary | ICD-10-CM | POA: Diagnosis not present

## 2018-01-03 DIAGNOSIS — I10 Essential (primary) hypertension: Secondary | ICD-10-CM | POA: Diagnosis not present

## 2018-01-04 DIAGNOSIS — M1990 Unspecified osteoarthritis, unspecified site: Secondary | ICD-10-CM | POA: Diagnosis not present

## 2018-01-04 DIAGNOSIS — J40 Bronchitis, not specified as acute or chronic: Secondary | ICD-10-CM | POA: Diagnosis not present

## 2018-01-04 DIAGNOSIS — Z6824 Body mass index (BMI) 24.0-24.9, adult: Secondary | ICD-10-CM | POA: Diagnosis not present

## 2018-01-09 DIAGNOSIS — I10 Essential (primary) hypertension: Secondary | ICD-10-CM | POA: Diagnosis not present

## 2018-01-09 DIAGNOSIS — M47816 Spondylosis without myelopathy or radiculopathy, lumbar region: Secondary | ICD-10-CM | POA: Diagnosis not present

## 2018-01-11 DIAGNOSIS — J189 Pneumonia, unspecified organism: Secondary | ICD-10-CM | POA: Diagnosis not present

## 2018-01-17 DIAGNOSIS — R972 Elevated prostate specific antigen [PSA]: Secondary | ICD-10-CM | POA: Diagnosis not present

## 2018-01-17 DIAGNOSIS — N401 Enlarged prostate with lower urinary tract symptoms: Secondary | ICD-10-CM | POA: Diagnosis not present

## 2018-01-17 DIAGNOSIS — M546 Pain in thoracic spine: Secondary | ICD-10-CM | POA: Diagnosis not present

## 2018-01-17 DIAGNOSIS — R351 Nocturia: Secondary | ICD-10-CM | POA: Diagnosis not present

## 2018-01-19 ENCOUNTER — Emergency Department (HOSPITAL_COMMUNITY): Payer: PPO

## 2018-01-19 ENCOUNTER — Other Ambulatory Visit: Payer: Self-pay

## 2018-01-19 ENCOUNTER — Encounter (HOSPITAL_COMMUNITY): Payer: Self-pay | Admitting: *Deleted

## 2018-01-19 ENCOUNTER — Emergency Department (HOSPITAL_COMMUNITY)
Admission: EM | Admit: 2018-01-19 | Discharge: 2018-01-19 | Disposition: A | Discharge status: Home / Self Care | Payer: PPO | Attending: Emergency Medicine | Admitting: Emergency Medicine

## 2018-01-19 DIAGNOSIS — E119 Type 2 diabetes mellitus without complications: Secondary | ICD-10-CM | POA: Diagnosis not present

## 2018-01-19 DIAGNOSIS — I10 Essential (primary) hypertension: Secondary | ICD-10-CM | POA: Diagnosis not present

## 2018-01-19 DIAGNOSIS — Z794 Long term (current) use of insulin: Secondary | ICD-10-CM | POA: Diagnosis not present

## 2018-01-19 DIAGNOSIS — Y929 Unspecified place or not applicable: Secondary | ICD-10-CM | POA: Diagnosis not present

## 2018-01-19 DIAGNOSIS — M62830 Muscle spasm of back: Secondary | ICD-10-CM | POA: Insufficient documentation

## 2018-01-19 DIAGNOSIS — I2510 Atherosclerotic heart disease of native coronary artery without angina pectoris: Secondary | ICD-10-CM | POA: Insufficient documentation

## 2018-01-19 DIAGNOSIS — S39012A Strain of muscle, fascia and tendon of lower back, initial encounter: Secondary | ICD-10-CM | POA: Diagnosis not present

## 2018-01-19 DIAGNOSIS — X58XXXA Exposure to other specified factors, initial encounter: Secondary | ICD-10-CM | POA: Insufficient documentation

## 2018-01-19 DIAGNOSIS — Y939 Activity, unspecified: Secondary | ICD-10-CM | POA: Diagnosis not present

## 2018-01-19 DIAGNOSIS — Z951 Presence of aortocoronary bypass graft: Secondary | ICD-10-CM | POA: Insufficient documentation

## 2018-01-19 DIAGNOSIS — R1032 Left lower quadrant pain: Secondary | ICD-10-CM | POA: Diagnosis present

## 2018-01-19 DIAGNOSIS — Y999 Unspecified external cause status: Secondary | ICD-10-CM | POA: Insufficient documentation

## 2018-01-19 DIAGNOSIS — M549 Dorsalgia, unspecified: Secondary | ICD-10-CM

## 2018-01-19 DIAGNOSIS — Z7982 Long term (current) use of aspirin: Secondary | ICD-10-CM | POA: Diagnosis not present

## 2018-01-19 DIAGNOSIS — N200 Calculus of kidney: Secondary | ICD-10-CM | POA: Diagnosis not present

## 2018-01-19 DIAGNOSIS — Z79899 Other long term (current) drug therapy: Secondary | ICD-10-CM | POA: Diagnosis not present

## 2018-01-19 DIAGNOSIS — Z955 Presence of coronary angioplasty implant and graft: Secondary | ICD-10-CM | POA: Diagnosis not present

## 2018-01-19 DIAGNOSIS — M546 Pain in thoracic spine: Secondary | ICD-10-CM | POA: Diagnosis not present

## 2018-01-19 DIAGNOSIS — J189 Pneumonia, unspecified organism: Secondary | ICD-10-CM | POA: Diagnosis not present

## 2018-01-19 LAB — COMPREHENSIVE METABOLIC PANEL
ALT: 66 U/L — ABNORMAL HIGH (ref 0–44)
AST: 47 U/L — ABNORMAL HIGH (ref 15–41)
Albumin: 4.1 g/dL (ref 3.5–5.0)
Alkaline Phosphatase: 56 U/L (ref 38–126)
Anion gap: 14 (ref 5–15)
BUN: 22 mg/dL (ref 8–23)
CO2: 24 mmol/L (ref 22–32)
Calcium: 9.8 mg/dL (ref 8.9–10.3)
Chloride: 97 mmol/L — ABNORMAL LOW (ref 98–111)
Creatinine, Ser: 1.06 mg/dL (ref 0.61–1.24)
GFR calc Af Amer: >60 mL/min (ref >60)
GFR calc non Af Amer: >60 mL/min (ref >60)
Glucose, Bld: 160 mg/dL — ABNORMAL HIGH (ref 70–99)
Potassium: 4.3 mmol/L (ref 3.5–5.1)
Sodium: 135 mmol/L (ref 135–145)
Total Bilirubin: 1.5 mg/dL — ABNORMAL HIGH (ref 0.3–1.2)
Total Protein: 8.4 g/dL — ABNORMAL HIGH (ref 6.5–8.1)

## 2018-01-19 LAB — URINALYSIS, ROUTINE W REFLEX MICROSCOPIC
Bacteria, UA: NONE SEEN
Bilirubin Urine: NEGATIVE
Glucose, UA: NEGATIVE mg/dL
Hgb urine dipstick: NEGATIVE
Ketones, ur: NEGATIVE mg/dL
Leukocytes, UA: NEGATIVE
Nitrite: NEGATIVE
Protein, ur: 30 mg/dL — AB
Specific Gravity, Urine: 1.023 (ref 1.005–1.030)
pH: 5 (ref 5.0–8.0)

## 2018-01-19 LAB — CBC
HCT: 46.2 % (ref 39.0–52.0)
Hemoglobin: 15.7 g/dL (ref 13.0–17.0)
MCH: 31.4 pg (ref 26.0–34.0)
MCHC: 34 g/dL (ref 30.0–36.0)
MCV: 92.4 fL (ref 80.0–100.0)
Platelets: 342 10*3/uL (ref 150–400)
RBC: 5 MIL/uL (ref 4.22–5.81)
RDW: 12.5 % (ref 11.5–15.5)
WBC: 12.5 10*3/uL — ABNORMAL HIGH (ref 4.0–10.5)
nRBC: 0 % (ref 0.0–0.2)

## 2018-01-19 MED ORDER — IBUPROFEN 400 MG PO TABS
400.0000 mg | ORAL_TABLET | Freq: Once | ORAL | Status: AC
  Administered 2018-01-19: 400 mg via ORAL
  Filled 2018-01-19: qty 1

## 2018-01-19 MED ORDER — METHOCARBAMOL 500 MG PO TABS
750.0000 mg | ORAL_TABLET | Freq: Once | ORAL | Status: AC
  Administered 2018-01-19: 750 mg via ORAL
  Filled 2018-01-19: qty 2

## 2018-01-19 MED ORDER — METHOCARBAMOL 750 MG PO TABS: 750.0000 mg | ORAL_TABLET | Freq: Three times a day (TID) | ORAL | 0 refills | Status: DC | PRN

## 2018-01-19 NOTE — Discharge Instructions (Signed)
It was our pleasure to provide your ER care today - we hope that you feel better.  Try heat therapy and/or gentle massage of area for symptom relief.  Take acetaminophen and/or ibuprofen as need for pain.  You may try taking robaxin as need for muscle pain/spasm - no driving for the next 4 hours, or when taking robaxin.  Follow up with primary care doctor in the coming week for recheck.  Return to ER if worse, new symptoms, fevers, new or severe pain, other concern.

## 2018-01-19 NOTE — ED Triage Notes (Signed)
PT comes today with Lt sided lower back/Lt flank  pain that is new. Pt  Reports he has had a lot of Kidney stones  Pt reports a recent PNA and just completed Anti-BX . Today Pt is SHOB that is new.

## 2018-01-19 NOTE — ED Notes (Signed)
Patient transported to CT 

## 2018-01-19 NOTE — ED Provider Notes (Addendum)
[facility] EMERGENCY DEPARTMENT Provider Note   CSN: [id number] Arrival date & time: 01/19/18  0818     History   Chief Complaint Chief Complaint  Patient presents with  . Back Pain    HPI Ricardo Le is a 77 y.o. male.  Patient with hx kidney stone, pancreatic ca, c/o left flank pain posteriorly for past 1-2 weeks. Pain moderate-sev, dull, non radiating, waxes and wanes in intensity. No specific exacerbating or allev factors. No nausea/vomiting. No fever or chills. No hematuria or dysuria. Also notes non prod cough in past couple weeks - states completed course levaquin for same.   The history is provided by the patient.    Past Medical History:  Diagnosis Date  . CAD (coronary artery disease)    a. 04/2007 s/p CABG x 4  - LIMA->LAD, VG->OM1->Om2, VG->PDA;  b. 12/2008 & 06/2010 Caths - Native multivessel dzs with 4/4 patent grafts. c. cath 03/09/2015 patent grafts, unchanged from prior cath.  . Cancer [facility] [place])    pancreatic  March 2011  . Carotid bruit    a. 07/2010 U/S- 0-39% bilat ICA stenosis  . Chest pain    Noncardiac probably related to reflux  . DDD (degenerative disc disease)    several surgeries  . GERD (gastroesophageal reflux disease)   . HNP (herniated nucleus pulposus), lumbar    a. L2-3, s/p laminotomy, microdiskectomy 02/2009  . HOH (hard of hearing)    uses amplifiers   . Nephrolithiasis   . PAD (peripheral artery disease) ([place]) 04/2014   s/p L Common Iliac Stent  . Palpitations   . 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 Pneumonia    hx of  pna  . Pure hypercholesterolemia    statin intolerance  . Type II or unspecified type diabetes mellitus without mention of complication, not stated as uncontrolled    insulin dependent  . Unspecified essential hypertension     Patient Active Problem List   Diagnosis Date Noted  . Chest pain 02/20/2017  . Precordial chest pain   . Angina pectoris  ([place]) 02/19/2017  . Atherosclerotic heart disease of native coronary artery with unstable angina pectoris ([place]) 03/08/2015  . Hyperlipidemia 01/26/2015  . Cervical spondylosis without myelopathy 07/28/2014  . Cervical spondylosis 07/28/2014  . Constipation 05/19/2014  . Pain 05/18/2014  . PAD (peripheral artery disease) ([place]) 03/20/2012  . Unstable angina ([place]) 05/05/2011  . DDD (degenerative disc disease)   . HNP (herniated nucleus pulposus), lumbar   . Pancreatic mass   . Nephrolithiasis   . Carotid bruit   . S/P CABG x 4 03/08/2011  . Bruit 07/06/2010  . NONSPECIFIC ABN FINDING RAD & OTH EXAM GI TRACT 06/09/2009  . NAUSEA ALONE 05/28/2009  . CHANGE IN BOWELS 05/28/2009  . Elevated lipids 05/27/2009  . ABDOMINAL PAIN, GENERALIZED 05/27/2009  . CONSTIPATION 05/10/2009  . Type 2 diabetes mellitus with vascular disease ([place]) 12/16/2008  . Essential hypertension 12/16/2008  . BACK PAIN 12/16/2008  . Palpitations 12/16/2008    Past Surgical History:  Procedure Laterality Date  . ABDOMINAL AORTAGRAM N/A 05/13/2014   Procedure: ABDOMINAL [name];  Surgeon: Wellington Hampshire, MD;  Location: [facility] CATH LAB;  Service: Cardiovascular;  Laterality: N/A;  . ANTERIOR CERVICAL DECOMP/DISCECTOMY FUSION N/A 07/28/2014   Procedure: ANTERIOR CERVICAL DECOMPRESSION/DISCECTOMY FUSION CERVICAL FOUR-FIVE CERVICAL FIVE-SIX ;  Surgeon: Earnie Larsson, MD;  Location: [facility] NEURO ORS;  Service: Neurosurgery;  Laterality: N/A;  .  APPENDECTOMY    . arthroscopy right knee    . BACK SURGERY    . CARDIAC CATHETERIZATION N/A 03/09/2015   Procedure: Left Heart Cath and Cors/Grafts Angiography;  Surgeon: [name] Sine, MD;  Location: [facility] CV LAB;  Service: Cardiovascular;  Laterality: N/A;  . CHOLECYSTECTOMY    . CORONARY ARTERY BYPASS GRAFT     X 4 2009 (LIMA to LAD, SVG to first cicumflex marginal branch with sequential SVG to second cicumflex marginal branch  and saphenous vein to posterior descending  coronary artery with endoscopic,vein harvest rt. lower exttremity by Dr.Owen on March 31,2009  . CYST REMOVED     FROM SPINE  . CYSTOURETHROSCOPY     right retrograde pyelogram,manipulate stone in the renal pelvis, rt. double-j catheter.  [name] Kitchen EYE SURGERY    . KNEE SURGERY     rt knee  . LEFT HEART CATH AND CORS/GRAFTS ANGIOGRAPHY N/A 02/20/2017   Procedure: LEFT HEART CATH AND CORS/GRAFTS ANGIOGRAPHY;  Surgeon: Leonie Man, MD;  Location: [facility] CV LAB;  Service: Cardiovascular;  Laterality: N/A;  . NEPHROLITHOTOMY     PERCUTANEOUS  . PARS PLANA VITRECTOMY     lt. eye,retinal photocoagulation lt. eye, membrane peel lt. eye.  [name] Kitchen PERCUTANEOUS STENT INTERVENTION Left 05/13/2014   Procedure: PERCUTANEOUS STENT INTERVENTION;  Surgeon: Wellington Hampshire, MD;  Location: [facility] CATH LAB;  Service: Cardiovascular;  Laterality: Left;  COMMON ILLIAC  . re-exploratin of laminectomy  05/2008   (RT L2-3) WITH REDO MICRODISKECTOMY        Home Medications    Prior to Admission medications   Medication Sig Start Date End Date Taking? Authorizing Provider  acetaminophen (TYLENOL) 325 MG tablet Take 650 mg by mouth every 6 (six) hours as needed (pain).    [provider]  amLODipine (NORVASC) 10 MG tablet Take 10 mg by mouth daily. 08/16/16   [provider]  aspirin EC 81 MG tablet Take 81 mg by mouth at bedtime.    [provider]  atorvastatin (LIPITOR) 10 MG tablet TAKE ONE TABLET BY MOUTH DAILY 09/11/16   Wellington Hampshire, MD  ezetimibe (ZETIA) 10 MG tablet Take 1 tablet (10 mg total) by mouth at bedtime. 02/21/17   Helberg, Larkin Ina, MD  glipiZIDE (GLUCOTROL) 10 MG tablet Take 10 mg by mouth 2 (two) times daily.     [provider]  insulin glargine (LANTUS SOLOSTAR) 100 UNIT/ML injection Inject 10 Units into the skin at bedtime.     [provider]  isosorbide mononitrate (IMDUR) 60 MG 24 hr tablet Take 1 tablet (60 mg total) by mouth daily. 02/22/17    Ina Homes, MD  lisinopril-hydrochlorothiazide (PRINZIDE,ZESTORETIC) 20-12.5 MG per tablet Take 1 tablet by mouth daily.      [provider]  Multiple Vitamin (MULTIVITAMIN WITH MINERALS) TABS tablet Take 1 tablet by mouth daily.    [provider]  nitroGLYCERIN (NITROSTAT) 0.4 MG SL tablet Place 1 tablet (0.4 mg total) under the tongue every 5 (five) minutes as needed for chest pain. 10/23/16 10/22/17  Josue Hector, MD  omeprazole (PRILOSEC) 20 MG capsule Take 20 mg by mouth daily.    [provider]  tamsulosin (FLOMAX) 0.4 MG CAPS capsule Take 0.4 mg by mouth daily after supper.    [provider]  simvastatin (ZOCOR) 40 MG tablet Take 40 mg by mouth at bedtime.    05/05/11  [provider]  sucralfate (CARAFATE) 1 GM/10ML suspension 10cc by mouth  two times a day   05/05/11  [provider]    Family History Family History  Problem Relation Age of Onset  . Heart attack Father        died of MI @ 60  . Diabetes Brother        type 2  . Other Mother        died @ 55  . Coronary artery disease Sister        alive  . Other Unknown        no fh of colon cancer  . Coronary artery disease Brother        s/p CABG.  Alive @ 49    Social History Social History   Tobacco Use  . Smoking status: Never Smoker  . Smokeless tobacco: Never Used  Substance Use Topics  . Alcohol use: No  . Drug use: No     Allergies   Rosuvastatin calcium; Iohexol; and Statins   Review of Systems Review of Systems  Constitutional: Negative for fever.  HENT: Negative for sore throat.   Eyes: Negative for redness.  Respiratory: Positive for cough. Negative for shortness of breath.   Cardiovascular: Negative for chest pain.  Gastrointestinal: Negative for abdominal pain and vomiting.  Endocrine: Negative for polyuria.  Genitourinary: Positive for flank pain. Negative for hematuria.  Musculoskeletal: Negative for back pain and neck pain.    Skin: Negative for rash.  Neurological: Negative for headaches.  Hematological: Does not bruise/bleed easily.  Psychiatric/Behavioral: Negative for confusion.     Physical Exam Updated Vital Signs There were no vitals taken for this visit.  Physical Exam  Constitutional: He is oriented to person, place, and time. He appears well-developed and well-nourished.  HENT:  Mouth/Throat: Oropharynx is clear and moist.  Eyes: Conjunctivae are normal.  Neck: Neck supple. No tracheal deviation present.  Cardiovascular: Normal rate, regular rhythm, normal heart sounds and intact distal pulses. Exam reveals no gallop and no friction rub.  No murmur heard. Pulmonary/Chest: Effort normal and breath sounds normal. No accessory muscle usage. No respiratory distress.  Abdominal: Soft. Bowel sounds are normal. He exhibits no distension and no mass. There is no tenderness. There is no guarding.  Genitourinary:  Genitourinary Comments: No cva tenderness  Musculoskeletal: He exhibits no edema.  T/S spine non tender, aligned (prior/remote healed surgical scar).  Neurological: He is alert and oriented to person, place, and time.  Skin: Skin is warm and dry. No rash noted.  No shingles/rash in area of pain.   Psychiatric: He has a normal mood and affect.  Nursing note and vitals reviewed.    ED Treatments / Results  Labs (all labs ordered are listed, but only abnormal results are displayed) Results for orders placed or performed during the hospital encounter of 01/19/18  CBC  Result Value Ref Range   WBC 12.5 (H) 4.0 - 10.5 K/uL   RBC 5.00 4.22 - 5.81 MIL/uL   Hemoglobin 15.7 13.0 - 17.0 g/dL   HCT 46.2 39.0 - 52.0 %   MCV 92.4 80.0 - 100.0 fL   MCH 31.4 26.0 - 34.0 pg   MCHC 34.0 30.0 - 36.0 g/dL   RDW 12.5 11.5 - 15.5 %   Platelets 342 150 - 400 K/uL   nRBC 0.0 0.0 - 0.2 %  Comprehensive metabolic panel  Result Value Ref Range   Sodium 135 135 - 145 mmol/L   Potassium 4.3 3.5 - 5.1  mmol/L   Chloride 97 (L)  98 - 111 mmol/L   CO2 24 22 - 32 mmol/L   Glucose, Bld 160 (H) 70 - 99 mg/dL   BUN 22 8 - 23 mg/dL   Creatinine, Ser 1.06 0.61 - 1.24 mg/dL   Calcium 9.8 8.9 - 10.3 mg/dL   Total Protein 8.4 (H) 6.5 - 8.1 g/dL   Albumin 4.1 3.5 - 5.0 g/dL   AST 47 (H) 15 - 41 U/L   ALT 66 (H) 0 - 44 U/L   Alkaline Phosphatase 56 38 - 126 U/L   Total Bilirubin 1.5 (H) 0.3 - 1.2 mg/dL   GFR calc non Af Amer >60 >60 mL/min   GFR calc Af Amer >60 >60 mL/min   Anion gap 14 5 - 15  Urinalysis, Routine w reflex microscopic  Result Value Ref Range   Color, Urine YELLOW YELLOW   APPearance CLEAR CLEAR   Specific Gravity, Urine 1.023 1.005 - 1.030   pH 5.0 5.0 - 8.0   Glucose, UA NEGATIVE NEGATIVE mg/dL   Hgb urine dipstick NEGATIVE NEGATIVE   Bilirubin Urine NEGATIVE NEGATIVE   Ketones, ur NEGATIVE NEGATIVE mg/dL   Protein, ur 30 (A) NEGATIVE mg/dL   Nitrite NEGATIVE NEGATIVE   Leukocytes, UA NEGATIVE NEGATIVE   RBC / HPF 0-5 0 - 5 RBC/hpf   WBC, UA 0-5 0 - 5 WBC/hpf   Bacteria, UA NONE SEEN NONE SEEN   Squamous Epithelial / LPF 0-5 0 - 5   Dg Chest 2 View  Result Date: 01/19/2018 CLINICAL DATA:  Pt has had pneumonia since last week and is still sick and experiencing SOB and pain the left posterior side of his chest. Pt is a nonsmoker. EXAM: CHEST - 2 VIEW COMPARISON:  02/19/2017 FINDINGS: Prior median sternotomy. Moderate lower thoracic spondylosis. Mild to moderate right hemidiaphragm elevation. Lower cervical spine fixation. Patient rotated to the right on the frontal radiograph. Normal heart size and mediastinal contours for age. No pleural effusion or pneumothorax. Mildly low lung volumes, without lobar consolidation. Cholecystectomy clips. IMPRESSION: No acute cardiopulmonary disease. Electronically Signed   By: Abigail Miyamoto M.D.   On: 01/19/2018 10:53   Ct Renal Stone Study  Result Date: 01/19/2018 CLINICAL DATA:  Left flank pain for 2 weeks. EXAM: CT ABDOMEN AND PELVIS  WITHOUT CONTRAST TECHNIQUE: Multidetector CT imaging of the abdomen and pelvis was performed following the standard protocol without IV contrast. COMPARISON:  03/07/2017 FINDINGS: Lower chest: No acute abnormality. Hepatobiliary: No focal liver abnormality is seen. Status post cholecystectomy. No biliary dilatation. Pancreas: Prior partial distal pancreatectomy with postsurgical changes. No focal fluid collection. No peripancreatic inflammatory changes. Spleen: Prior splenectomy. Adrenals/Urinary Tract: Normal adrenal glands. 4.5 mm nonobstructing right inferior pole renal calculus. No ureteral calculi. No obstructive uropathy. No perinephric fluid collection. No renal mass. Normal bladder. Stomach/Bowel: Stomach is within normal limits. Moderate amount of stool throughout the colon. No evidence of bowel wall thickening, distention, or inflammatory changes. Vascular/Lymphatic: Abdominal aortic atherosclerosis. Normal caliber abdominal aorta. Mild atherosclerotic plaque in the superior mesenteric artery. Calcified plaque at the origin of the celiac artery. Heavy calcified atherosclerotic plaque in bilateral common iliac arteries. Atherosclerotic plaque in the femoral arteries. Reproductive: Mild prostatic enlargement. Other: No abdominal wall hernia or abnormality. No abdominopelvic ascites. Musculoskeletal: No acute osseous abnormality. No aggressive osseous lesion. Prior laminectomy from L2 through L5. Degenerative disc disease with disc height loss throughout the lumbar spine. Bilateral facet arthropathy throughout the lumbar spine. Bilateral foraminal stenosis throughout the lumbar spine. IMPRESSION: 1. Nonobstructing  4.5 mm right inferior pole renal calculus. No hydronephrosis. No ureteral calculi. 2. Postsurgical changes from prior distal pancreatectomy and splenectomy. 3.  Aortic Atherosclerosis (ICD10-I70.0). 4. Diffuse lumbar spine spondylosis. Electronically Signed   By: Kathreen Devoid   On: 01/19/2018 09:36      EKG EKG Interpretation  Date/Time:  Saturday January 19 2018 08:32:36 EST Ventricular Rate:  67 PR Interval:    QRS Duration: 103 QT Interval:  396 QTC Calculation: 418 R Axis:   -123 Text Interpretation:  Sinus rhythm Left axis deviation Left anterior fasicular block Baseline wander Confirmed by Lajean Saver [phone] on 01/19/2018 8:58:51 AM   Radiology Dg Chest 2 View  Result Date: 01/19/2018 CLINICAL DATA:  Pt has had pneumonia since last week and is still sick and experiencing SOB and pain the left posterior side of his chest. Pt is a nonsmoker. EXAM: CHEST - 2 VIEW COMPARISON:  02/19/2017 FINDINGS: Prior median sternotomy. Moderate lower thoracic spondylosis. Mild to moderate right hemidiaphragm elevation. Lower cervical spine fixation. Patient rotated to the right on the frontal radiograph. Normal heart size and mediastinal contours for age. No pleural effusion or pneumothorax. Mildly low lung volumes, without lobar consolidation. Cholecystectomy clips. IMPRESSION: No acute cardiopulmonary disease. Electronically Signed   By: Abigail Miyamoto M.D.   On: 01/19/2018 10:53   Ct Renal Stone Study  Result Date: 01/19/2018 CLINICAL DATA:  Left flank pain for 2 weeks. EXAM: CT ABDOMEN AND PELVIS WITHOUT CONTRAST TECHNIQUE: Multidetector CT imaging of the abdomen and pelvis was performed following the standard protocol without IV contrast. COMPARISON:  03/07/2017 FINDINGS: Lower chest: No acute abnormality. Hepatobiliary: No focal liver abnormality is seen. Status post cholecystectomy. No biliary dilatation. Pancreas: Prior partial distal pancreatectomy with postsurgical changes. No focal fluid collection. No peripancreatic inflammatory changes. Spleen: Prior splenectomy. Adrenals/Urinary Tract: Normal adrenal glands. 4.5 mm nonobstructing right inferior pole renal calculus. No ureteral calculi. No obstructive uropathy. No perinephric fluid collection. No renal mass. Normal bladder.  Stomach/Bowel: Stomach is within normal limits. Moderate amount of stool throughout the colon. No evidence of bowel wall thickening, distention, or inflammatory changes. Vascular/Lymphatic: Abdominal aortic atherosclerosis. Normal caliber abdominal aorta. Mild atherosclerotic plaque in the superior mesenteric artery. Calcified plaque at the origin of the celiac artery. Heavy calcified atherosclerotic plaque in bilateral common iliac arteries. Atherosclerotic plaque in the femoral arteries. Reproductive: Mild prostatic enlargement. Other: No abdominal wall hernia or abnormality. No abdominopelvic ascites. Musculoskeletal: No acute osseous abnormality. No aggressive osseous lesion. Prior laminectomy from L2 through L5. Degenerative disc disease with disc height loss throughout the lumbar spine. Bilateral facet arthropathy throughout the lumbar spine. Bilateral foraminal stenosis throughout the lumbar spine. IMPRESSION: 1. Nonobstructing 4.5 mm right inferior pole renal calculus. No hydronephrosis. No ureteral calculi. 2. Postsurgical changes from prior distal pancreatectomy and splenectomy. 3.  Aortic Atherosclerosis (ICD10-I70.0). 4. Diffuse lumbar spine spondylosis. Electronically Signed   By: Kathreen Devoid   On: 01/19/2018 09:36    Procedures Procedures (including critical care time)  Medications Ordered in ED Medications - No data to display   Initial Impression / Assessment and Plan / ED Course  I have reviewed the triage vital signs and the nursing notes.  Pertinent labs & imaging results that were available during my care of the patient were reviewed by me and considered in my medical decision making (see chart for details).  Iv ns. Labs.   Reviewed nursing notes and prior charts for additional history.   Labs reviewed - chem normal.  Ct reviewed - no ureteral stone.   cxr reviewed - no pna.   ?msk back pain, ?ddd/nerve impingement.   Pt has ride, does not have to drive. Acetaminophen  po. Robaxin po.  Pt currently appears stable for d/c.     Final Clinical Impressions(s) / ED Diagnoses   Final diagnoses:  None    ED Discharge Orders    None           Lajean Saver, MD 01/19/18 1103

## 2018-01-21 DIAGNOSIS — Z6824 Body mass index (BMI) 24.0-24.9, adult: Secondary | ICD-10-CM | POA: Diagnosis not present

## 2018-01-21 DIAGNOSIS — M1990 Unspecified osteoarthritis, unspecified site: Secondary | ICD-10-CM | POA: Diagnosis not present

## 2018-01-21 DIAGNOSIS — J029 Acute pharyngitis, unspecified: Secondary | ICD-10-CM | POA: Diagnosis not present

## 2018-01-21 DIAGNOSIS — I10 Essential (primary) hypertension: Secondary | ICD-10-CM | POA: Diagnosis not present

## 2018-01-29 DIAGNOSIS — K5909 Other constipation: Secondary | ICD-10-CM | POA: Diagnosis not present

## 2018-01-29 DIAGNOSIS — R945 Abnormal results of liver function studies: Secondary | ICD-10-CM | POA: Diagnosis not present

## 2018-01-29 DIAGNOSIS — R1013 Epigastric pain: Secondary | ICD-10-CM | POA: Diagnosis not present

## 2018-01-29 DIAGNOSIS — K219 Gastro-esophageal reflux disease without esophagitis: Secondary | ICD-10-CM | POA: Diagnosis not present

## 2018-02-08 DIAGNOSIS — D510 Vitamin B12 deficiency anemia due to intrinsic factor deficiency: Secondary | ICD-10-CM | POA: Diagnosis not present

## 2018-02-12 DIAGNOSIS — I10 Essential (primary) hypertension: Secondary | ICD-10-CM | POA: Diagnosis not present

## 2018-02-12 DIAGNOSIS — E1165 Type 2 diabetes mellitus with hyperglycemia: Secondary | ICD-10-CM | POA: Diagnosis not present

## 2018-02-18 ENCOUNTER — Encounter (INDEPENDENT_AMBULATORY_CARE_PROVIDER_SITE_OTHER): Payer: PPO | Admitting: Ophthalmology

## 2018-02-18 DIAGNOSIS — N400 Enlarged prostate without lower urinary tract symptoms: Secondary | ICD-10-CM | POA: Diagnosis not present

## 2018-02-18 DIAGNOSIS — R634 Abnormal weight loss: Secondary | ICD-10-CM | POA: Diagnosis not present

## 2018-02-18 DIAGNOSIS — N200 Calculus of kidney: Secondary | ICD-10-CM | POA: Diagnosis not present

## 2018-02-18 DIAGNOSIS — Z8507 Personal history of malignant neoplasm of pancreas: Secondary | ICD-10-CM | POA: Diagnosis not present

## 2018-02-18 DIAGNOSIS — R630 Anorexia: Secondary | ICD-10-CM | POA: Diagnosis not present

## 2018-02-18 DIAGNOSIS — C259 Malignant neoplasm of pancreas, unspecified: Secondary | ICD-10-CM | POA: Diagnosis not present

## 2018-02-19 DIAGNOSIS — M25551 Pain in right hip: Secondary | ICD-10-CM | POA: Diagnosis not present

## 2018-02-19 DIAGNOSIS — Z9081 Acquired absence of spleen: Secondary | ICD-10-CM | POA: Diagnosis not present

## 2018-02-19 DIAGNOSIS — Z90411 Acquired partial absence of pancreas: Secondary | ICD-10-CM | POA: Diagnosis not present

## 2018-02-19 DIAGNOSIS — N400 Enlarged prostate without lower urinary tract symptoms: Secondary | ICD-10-CM | POA: Diagnosis not present

## 2018-02-19 DIAGNOSIS — M25552 Pain in left hip: Secondary | ICD-10-CM | POA: Diagnosis not present

## 2018-02-19 DIAGNOSIS — Z794 Long term (current) use of insulin: Secondary | ICD-10-CM | POA: Diagnosis not present

## 2018-02-19 DIAGNOSIS — C7A8 Other malignant neuroendocrine tumors: Secondary | ICD-10-CM | POA: Diagnosis not present

## 2018-02-19 DIAGNOSIS — R109 Unspecified abdominal pain: Secondary | ICD-10-CM | POA: Diagnosis not present

## 2018-02-20 DIAGNOSIS — C7A8 Other malignant neuroendocrine tumors: Secondary | ICD-10-CM | POA: Diagnosis not present

## 2018-02-21 DIAGNOSIS — Z6824 Body mass index (BMI) 24.0-24.9, adult: Secondary | ICD-10-CM | POA: Diagnosis not present

## 2018-02-21 DIAGNOSIS — R05 Cough: Secondary | ICD-10-CM | POA: Diagnosis not present

## 2018-02-21 DIAGNOSIS — Z23 Encounter for immunization: Secondary | ICD-10-CM | POA: Diagnosis not present

## 2018-02-21 DIAGNOSIS — Z87898 Personal history of other specified conditions: Secondary | ICD-10-CM | POA: Diagnosis not present

## 2018-02-21 DIAGNOSIS — D72829 Elevated white blood cell count, unspecified: Secondary | ICD-10-CM | POA: Diagnosis not present

## 2018-03-01 DIAGNOSIS — C7A8 Other malignant neuroendocrine tumors: Secondary | ICD-10-CM | POA: Diagnosis not present

## 2018-03-01 DIAGNOSIS — J9811 Atelectasis: Secondary | ICD-10-CM | POA: Diagnosis not present

## 2018-03-13 DIAGNOSIS — K219 Gastro-esophageal reflux disease without esophagitis: Secondary | ICD-10-CM | POA: Diagnosis not present

## 2018-03-13 DIAGNOSIS — R1013 Epigastric pain: Secondary | ICD-10-CM | POA: Diagnosis not present

## 2018-03-13 DIAGNOSIS — K5909 Other constipation: Secondary | ICD-10-CM | POA: Diagnosis not present

## 2018-03-14 DIAGNOSIS — D510 Vitamin B12 deficiency anemia due to intrinsic factor deficiency: Secondary | ICD-10-CM | POA: Diagnosis not present

## 2018-03-14 DIAGNOSIS — E1165 Type 2 diabetes mellitus with hyperglycemia: Secondary | ICD-10-CM | POA: Diagnosis not present

## 2018-03-14 DIAGNOSIS — I10 Essential (primary) hypertension: Secondary | ICD-10-CM | POA: Diagnosis not present

## 2018-03-21 NOTE — Progress Notes (Signed)
Cardiology Office Note   Date:  03/29/2018   ID:  Ricardo Le, DOB 10/29/1940, MRN [id number]  PCP:  Angelina Sheriff, MD  Cardiologist:  Dr. Johnsie Cancel  No chief complaint on file.     History of Present Illness: Ricardo Le is a 78 y.o. male who presents for a follow up visit regarding  CAD and PAD. He has known history of coronary artery disease status post coronary artery bypass grafting in 2009 . He presented in 02/2015 with chest pain. Cardiac cath showed patent grafts. Also with PVD followed by Dr [name] Left CIA stent March 2016 with known bilateral SFT occlusions and 1 vessel run off. 11/14/17 right ABI .70  left .14    He is a pancreatic cancer survivor   He has history of intolerance to statins due to myalgia.  Previous lumber  spine surgery for neuropathic pain   Last cath 02/20/17 with patent grafts diffuse small vessel disease ? Microvascular angina LIMA LAD patent SVG OM1/OM2 patent SVG PDA patent   Called office 02/26/17 with elevated BP racing heart and taking a lot of SL nitro. Unable to take imdur Refused to go to ER Turns out pain is GI Had EGD with [facility] for recurrent pancreatic cancer ? In stomach Sees Dr Nelda Mar[facility]ille at [facility]  He is back on Creon which upsets his stomach   Past Medical History:  Diagnosis Date  . CAD (coronary artery disease)    a. 04/2007 s/p CABG x 4  - LIMA->LAD, VG->OM1->Om2, VG->PDA;  b. 12/2008 & 06/2010 Caths - Native multivessel dzs with 4/4 patent grafts. c. cath 03/09/2015 patent grafts, unchanged from prior cath.  . Cancer [facility])    pancreatic  March 2011  . Carotid bruit    a. 07/2010 U/S- 0-39% bilat ICA stenosis  . Chest pain    Noncardiac probably related to reflux  . DDD (degenerative disc disease)    several surgeries  . GERD (gastroesophageal reflux disease)   . HNP (herniated nucleus pulposus), lumbar    a. L2-3, s/p laminotomy, microdiskectomy 02/2009  . HOH (hard of hearing)    uses amplifiers   .  Nephrolithiasis   . PAD (peripheral artery disease) ([place]) 04/2014   s/p L Common Iliac Stent  . Palpitations   . 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 Pneumonia    hx of  pna  . Pure hypercholesterolemia    statin intolerance  . Type II or unspecified type diabetes mellitus without mention of complication, not stated as uncontrolled    insulin dependent  . Unspecified essential hypertension     Past Surgical History:  Procedure Laterality Date  . ABDOMINAL AORTAGRAM N/A 05/13/2014   Procedure: ABDOMINAL [name];  Surgeon: Wellington Hampshire, MD;  Location: [place] CATH LAB;  Service: Cardiovascular;  Laterality: N/A;  . ANTERIOR CERVICAL DECOMP/DISCECTOMY FUSION N/A 07/28/2014   Procedure: ANTERIOR CERVICAL DECOMPRESSION/DISCECTOMY FUSION CERVICAL FOUR-FIVE CERVICAL FIVE-SIX ;  Surgeon: Earnie Larsson, MD;  Location: [place] NEURO ORS;  Service: Neurosurgery;  Laterality: N/A;  . APPENDECTOMY    . arthroscopy right knee    . BACK SURGERY    . CARDIAC CATHETERIZATION N/A 03/09/2015   Procedure: Left Heart Cath and Cors/Grafts Angiography;  Surgeon: [name] Sine, MD;  Location: [place] CV LAB;  Service: Cardiovascular;  Laterality: N/A;  . CHOLECYSTECTOMY    . CORONARY ARTERY BYPASS GRAFT  X 4 2009 (LIMA to LAD, SVG to first cicumflex marginal branch with sequential SVG to second cicumflex marginal branch  and saphenous vein to posterior descending coronary artery with endoscopic,vein harvest rt. lower exttremity by Dr.Owen on March 31,2009  . CYST REMOVED     FROM SPINE  . CYSTOURETHROSCOPY     right retrograde pyelogram,manipulate stone in the renal pelvis, rt. double-j catheter.  [name] Kitchen EYE SURGERY    . KNEE SURGERY     rt knee  . LEFT HEART CATH AND CORS/GRAFTS ANGIOGRAPHY N/A 02/20/2017   Procedure: LEFT HEART CATH AND CORS/GRAFTS ANGIOGRAPHY;  Surgeon: Leonie Man, MD;  Location: [place] CV LAB;  Service: Cardiovascular;  Laterality:  N/A;  . NEPHROLITHOTOMY     PERCUTANEOUS  . PARS PLANA VITRECTOMY     lt. eye,retinal photocoagulation lt. eye, membrane peel lt. eye.  [name] Kitchen PERCUTANEOUS STENT INTERVENTION Left 05/13/2014   Procedure: PERCUTANEOUS STENT INTERVENTION;  Surgeon: Wellington Hampshire, MD;  Location: [place] CATH LAB;  Service: Cardiovascular;  Laterality: Left;  COMMON ILLIAC  . re-exploratin of laminectomy  05/2008   (RT L2-3) WITH REDO MICRODISKECTOMY     Current Outpatient Medications  Medication Sig Dispense Refill  . acetaminophen (TYLENOL) 325 MG tablet Take 650 mg by mouth every 6 (six) hours as needed (pain).    [name] Kitchen amLODipine (NORVASC) 10 MG tablet Take 10 mg by mouth daily.    [name] Kitchen aspirin EC 81 MG tablet Take 81 mg by mouth at bedtime.    [name] Kitchen atorvastatin (LIPITOR) 10 MG tablet TAKE ONE TABLET BY MOUTH DAILY 90 tablet 3  . CREON 36000 units CPEP capsule Take 1 capsule by mouth.    . esomeprazole (NEXIUM) 40 MG capsule Take 1 capsule by mouth 2 (two) times daily.    [name] Kitchen ezetimibe (ZETIA) 10 MG tablet Take 1 tablet (10 mg total) by mouth at bedtime. 30 tablet 1  . gabapentin (NEURONTIN) 100 MG capsule Take 1 capsule by mouth as needed.    [name] Kitchen glipiZIDE (GLUCOTROL) 10 MG tablet Take 10 mg by mouth 2 (two) times daily.     . insulin glargine (LANTUS SOLOSTAR) 100 UNIT/ML injection Inject 10 Units into the skin at bedtime.     . isosorbide mononitrate (IMDUR) 60 MG 24 hr tablet Take 1 tablet (60 mg total) by mouth daily. 30 tablet 1  . lisinopril-hydrochlorothiazide (PRINZIDE,ZESTORETIC) 20-12.5 MG per tablet Take 1 tablet by mouth daily.      . methocarbamol (ROBAXIN) 750 MG tablet Take 1 tablet (750 mg total) by mouth every 8 (eight) hours as needed (muscle spasm/pain). 15 tablet 0  . Multiple Vitamin (MULTIVITAMIN WITH MINERALS) TABS tablet Take 1 tablet by mouth daily.    . nitroGLYCERIN (NITROSTAT) 0.4 MG SL tablet Place 1 tablet (0.4 mg total) under the tongue every 5 (five) minutes as needed for chest pain. 25 tablet 3   . omeprazole (PRILOSEC) 20 MG capsule Take 20 mg by mouth daily.    . simvastatin (ZOCOR) 40 MG tablet Take 40 mg by mouth at bedtime.    . sucralfate (CARAFATE) 1 GM/10ML suspension 10cc by mouth two times a day    . tamsulosin (FLOMAX) 0.4 MG CAPS capsule Take 0.4 mg by mouth daily after supper.     No current facility-administered medications for this visit.     Allergies:   Rosuvastatin calcium; Iohexol; and Statins    Social History:  The patient  reports that he has never smoked. He has never used  smokeless tobacco. He reports that he does not drink alcohol or use drugs.   Family History:  The patient's family history includes Coronary artery disease in his brother and sister; Diabetes in his brother; Heart attack in his father; Other in his mother and unknown relative.    ROS:  Please see the history of present illness.   Otherwise, review of systems are positive for none.   All other systems are reviewed and negative.    PHYSICAL EXAM: VS:  BP 138/60   Pulse 66   Ht 5\' 7"  (1.702 m)   Wt 161 lb 6.4 oz (73.2 kg)   SpO2 93%   BMI 25.28 kg/m  , BMI Body mass index is 25.28 kg/m. Affect appropriate Healthy:  appears stated age 25: normal Neck supple with no adenopathy JVP normal bilateral  bruits no thyromegaly Lungs clear with no wheezing and good diaphragmatic motion Heart:  S1/S2 no murmur, no rub, gallop or click PMI normal Abdomen: benighn, BS positve, no tenderness, no AAA Femoral bruist.  No HSM or HJR Distal pulses decreased below knees  No edema Neuro non-focal Skin warm and dry No muscular weakness   EKG:  10/23/16 SR rate 10 PVC old IMI LAD     Recent Labs: 01/19/2018: ALT 66; BUN 22; Creatinine, Ser 1.06; Hemoglobin 15.7; Platelets 342; Potassium 4.3; Sodium 135    Lipid Panel    Component Value Date/Time   CHOL 138 02/21/2017 0222   TRIG 44 02/21/2017 0222   HDL 43 02/21/2017 0222   CHOLHDL 3.2 02/21/2017 0222   VLDL 9 02/21/2017 0222    LDLCALC 86 02/21/2017 0222      Wt Readings from Last 3 Encounters:  03/29/18 161 lb 6.4 oz (73.2 kg)  01/19/18 160 lb (72.6 kg)  10/09/17 161 lb (73 kg)         ASSESSMENT AND PLAN:  1.  Peripheral arterial disease: Status post left iliac artery stenting. He is known to have bilateral SFA occlusion with stable mild bilateral calf claudication. Continue medical therapy. ABI's decreased in mild range at rest F/U [facility]   2. Coronary artery disease cath 02/20/17 patient grafts small vessel disease no angina medical Rx   3. Hyperlipidemia: Reported intolerance to statins but hard to differentiate from claudication    4. Essential hypertension:  Well controlled.  Continue current medications and low sodium Dash type diet.    5. Bilateral carotid disease: Most recent duplex on 02/15/17  showed mild atherosclerosis.  6. Pancreatic cancer - recent chest pain GI in nature with recent EGD ? Recurrent cancer Biopsy pending [facility] to arrange referral back to [facility] Dr Almira Coaster

## 2018-03-25 DIAGNOSIS — Z79899 Other long term (current) drug therapy: Secondary | ICD-10-CM | POA: Diagnosis not present

## 2018-03-25 DIAGNOSIS — E785 Hyperlipidemia, unspecified: Secondary | ICD-10-CM | POA: Diagnosis not present

## 2018-03-25 DIAGNOSIS — E559 Vitamin D deficiency, unspecified: Secondary | ICD-10-CM | POA: Diagnosis not present

## 2018-03-25 DIAGNOSIS — E1165 Type 2 diabetes mellitus with hyperglycemia: Secondary | ICD-10-CM | POA: Diagnosis not present

## 2018-03-29 ENCOUNTER — Ambulatory Visit: Payer: PPO | Admitting: Cardiovascular Disease

## 2018-03-29 ENCOUNTER — Encounter: Payer: Self-pay | Admitting: Cardiovascular Disease

## 2018-03-29 VITALS — BP 138/60 | HR 66 | Ht 67.0 in | Wt 161.4 lb

## 2018-03-29 DIAGNOSIS — I739 Peripheral vascular disease, unspecified: Secondary | ICD-10-CM | POA: Diagnosis not present

## 2018-03-29 DIAGNOSIS — I10 Essential (primary) hypertension: Secondary | ICD-10-CM | POA: Diagnosis not present

## 2018-03-29 DIAGNOSIS — I2510 Atherosclerotic heart disease of native coronary artery without angina pectoris: Secondary | ICD-10-CM | POA: Diagnosis not present

## 2018-03-29 DIAGNOSIS — I779 Disorder of arteries and arterioles, unspecified: Secondary | ICD-10-CM

## 2018-03-29 DIAGNOSIS — E782 Mixed hyperlipidemia: Secondary | ICD-10-CM | POA: Diagnosis not present

## 2018-03-29 NOTE — Patient Instructions (Addendum)
Medication Instructions:   If you need a refill on your cardiac medications before your next appointment, please call your pharmacy.   Lab work:  If you have labs (blood work) drawn today and your tests are completely normal, you will receive your results only by: [name] Kitchen MyChart Message (if you have MyChart) OR . A paper copy in the mail If you have any lab test that is abnormal or we need to change your treatment, we will call you to review the results.  Testing/Procedures: None ordered today.   Follow-Up: At [facility], you and your health needs are our priority.  As part of our continuing mission to provide you with exceptional heart care, we have created designated Provider Care Teams.  These Care Teams include your primary Cardiologist (physician) and Advanced Practice Providers (APPs -  Physician Assistants and Nurse Practitioners) who all work together to provide you with the care you need, when you need it.  Your physician wants you to follow-up in: 6 month with Dr. [name]. You will receive a reminder letter in the mail two months in advance. If you don't receive a letter, please call our office to schedule the follow-up appointment.  You will need a follow up appointment in 12 months.  Please call our office 2 months in advance to schedule this appointment.  You may see Jenkins Rouge, MD or one of the following Advanced Practice Providers on your designated Care Team:   Truitt Merle, NP Cecilie Kicks, NP . Kathyrn Drown, NP

## 2018-04-13 DIAGNOSIS — I10 Essential (primary) hypertension: Secondary | ICD-10-CM | POA: Diagnosis not present

## 2018-04-13 DIAGNOSIS — E1165 Type 2 diabetes mellitus with hyperglycemia: Secondary | ICD-10-CM | POA: Diagnosis not present

## 2018-04-18 DIAGNOSIS — R634 Abnormal weight loss: Secondary | ICD-10-CM | POA: Diagnosis not present

## 2018-04-18 DIAGNOSIS — C7A8 Other malignant neuroendocrine tumors: Secondary | ICD-10-CM | POA: Diagnosis not present

## 2018-04-18 DIAGNOSIS — K219 Gastro-esophageal reflux disease without esophagitis: Secondary | ICD-10-CM | POA: Diagnosis not present

## 2018-04-18 DIAGNOSIS — R194 Change in bowel habit: Secondary | ICD-10-CM | POA: Diagnosis not present

## 2018-04-29 DIAGNOSIS — I10 Essential (primary) hypertension: Secondary | ICD-10-CM | POA: Diagnosis not present

## 2018-04-29 DIAGNOSIS — D123 Benign neoplasm of transverse colon: Secondary | ICD-10-CM | POA: Diagnosis not present

## 2018-04-29 DIAGNOSIS — R194 Change in bowel habit: Secondary | ICD-10-CM | POA: Diagnosis not present

## 2018-04-29 DIAGNOSIS — K635 Polyp of colon: Secondary | ICD-10-CM | POA: Diagnosis not present

## 2018-04-29 DIAGNOSIS — D122 Benign neoplasm of ascending colon: Secondary | ICD-10-CM | POA: Diagnosis not present

## 2018-04-29 DIAGNOSIS — R634 Abnormal weight loss: Secondary | ICD-10-CM | POA: Diagnosis not present

## 2018-04-29 DIAGNOSIS — D124 Benign neoplasm of descending colon: Secondary | ICD-10-CM | POA: Diagnosis not present

## 2018-04-29 DIAGNOSIS — K219 Gastro-esophageal reflux disease without esophagitis: Secondary | ICD-10-CM | POA: Diagnosis not present

## 2018-04-29 DIAGNOSIS — E119 Type 2 diabetes mellitus without complications: Secondary | ICD-10-CM | POA: Diagnosis not present

## 2018-04-29 DIAGNOSIS — K6289 Other specified diseases of anus and rectum: Secondary | ICD-10-CM | POA: Diagnosis not present

## 2018-04-29 DIAGNOSIS — K2950 Unspecified chronic gastritis without bleeding: Secondary | ICD-10-CM | POA: Diagnosis not present

## 2018-04-30 ENCOUNTER — Telehealth: Payer: Self-pay | Admitting: Cardiovascular Disease

## 2018-04-30 NOTE — Telephone Encounter (Signed)
° °  Pt c/o of Chest Pain: STAT if CP now or developed within 24 hours  1. Are you having CP right now? NO  2. Are you experiencing any other symptoms (ex. SOB, nausea, vomiting, sweating)? NO  3. How long have you been experiencing CP? 1 month, getting worse  4. Is your CP continuous or coming and going? Coming and going  5. Have you taken Nitroglycerin? YES, today ?

## 2018-04-30 NOTE — Telephone Encounter (Signed)
Patient calling complaining of chest pain with activity, that has been going on for over a month and keeps getting worse. Patient refusing to go to ED. Patient stated taking nitro does help. Patient has history of CAD with grafts. Informed patient if his symptoms get worse and nitro does not help to call 911. Made patient an appointment to see Dr. Johnsie Cancel on Thursday.   COVID-19 Pre-Screening Questions: 1. Have you been in contact with someone that was recently sick with fever/cough or confirmed to have the [place] virus? NO  2. Do you have any of the following symptoms [cough, fever (100.4 or greater)], and/or shortness of breath)?  NO  ________________________________________________________________________________  Cardiac Questionnaire:    Since your last visit or hospitalization:    1. Have you been having chest pain? Yes   2. Have you been having shortness of breath? NO   3. Have you been having increasing edema, wt gain, or increase in abdominal girth (pants fitting more tightly)? NO   4. Have you had any passing out spells? NO

## 2018-05-01 NOTE — H&P (View-Only) (Signed)
Cardiology Office Note   Date:  05/02/2018   ID:  Ricardo Le, DOB 07/13/1940, MRN [id number]  PCP:  Angelina Sheriff, MD  Cardiologist:  Dr. Johnsie Cancel  No chief complaint on file.     History of Present Illness: Ricardo Le is a 78 y.o. male who presents for a follow up visit regarding  CAD and PAD. He has known history of coronary artery disease status post coronary artery bypass grafting in 2009 . He presented in 02/2015 with chest pain. Cardiac cath showed patent grafts. Also with PVD followed by Dr [name] Left CIA stent March 2016 with known bilateral SFT occlusions and 1 vessel run off. 11/14/17 right ABI .70  left .31    He is a pancreatic cancer survivor   He has history of intolerance to statins due to myalgia.  Previous lumber  spine surgery for neuropathic pain   Last cath 02/20/17 with patent grafts diffuse small vessel disease ? Microvascular angina LIMA LAD patent SVG OM1/OM2 patent SVG PDA patent   Called office 02/26/17 with elevated BP racing heart and taking a lot of SL nitro. Unable to take imdur Refused to go to ER Turns out pain is GI Had EGD with [facility] for recurrent pancreatic cancer ? In stomach Sees Dr [facility]elda [facility] at [facility]  He is back on Creon which upsets his stomach   Called office 3/26 complaining of chest pain Again refused to go to ER. Has also had bilateral hip and flank pain Has been placed on Creon for his pancrease Pain is exertional Nitro helps but not relieves. Had EGD last week Ok no esophagitis and stopped Creon on his own made him sick   Long discussion with him regarding options of increased medical Rx, risk stratifying with myovue or repeat Cath   Past Medical History:  Diagnosis Date  . CAD (coronary artery disease)    a. 04/2007 s/p CABG x 4  - LIMA->LAD, VG->OM1->Om2, VG->PDA;  b. 12/2008 & 06/2010 Caths - Native multivessel dzs with 4/4 patent grafts. c. cath 03/09/2015 patent grafts, unchanged from prior cath.  . Cancer [facility])     pancreatic  March 2011  . Carotid bruit    a. 07/2010 U/S- 0-39% bilat ICA stenosis  . Chest pain    Noncardiac probably related to reflux  . DDD (degenerative disc disease)    several surgeries  . GERD (gastroesophageal reflux disease)   . HNP (herniated nucleus pulposus), lumbar    a. L2-3, s/p laminotomy, microdiskectomy 02/2009  . HOH (hard of hearing)    uses amplifiers   . Nephrolithiasis   . PAD (peripheral artery disease) ([place]) 04/2014   s/p L Common Iliac Stent  . Palpitations   . 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 Pneumonia    hx of  pna  . Pure hypercholesterolemia    statin intolerance  . Type II or unspecified type diabetes mellitus without mention of complication, not stated as uncontrolled    insulin dependent  . Unspecified essential hypertension     Past Surgical History:  Procedure Laterality Date  . ABDOMINAL AORTAGRAM N/A 05/13/2014   Procedure: ABDOMINAL [name];  Surgeon: Wellington Hampshire, MD;  Location: [place] CATH LAB;  Service: Cardiovascular;  Laterality: N/A;  . ANTERIOR CERVICAL DECOMP/DISCECTOMY FUSION N/A 07/28/2014   Procedure: ANTERIOR CERVICAL DECOMPRESSION/DISCECTOMY FUSION CERVICAL FOUR-FIVE CERVICAL FIVE-SIX ;  Surgeon: Earnie Larsson, MD;  Location: [facility]  NEURO ORS;  Service: Neurosurgery;  Laterality: N/A;  . APPENDECTOMY    . arthroscopy right knee    . BACK SURGERY    . CARDIAC CATHETERIZATION N/A 03/09/2015   Procedure: Left Heart Cath and Cors/Grafts Angiography;  Surgeon: [name] Sine, MD;  Location: [place] CV LAB;  Service: Cardiovascular;  Laterality: N/A;  . CHOLECYSTECTOMY    . CORONARY ARTERY BYPASS GRAFT     X 4 2009 (LIMA to LAD, SVG to first cicumflex marginal branch with sequential SVG to second cicumflex marginal branch  and saphenous vein to posterior descending coronary artery with endoscopic,vein harvest rt. lower exttremity by Dr.Owen on March 31,2009  . CYST REMOVED      FROM SPINE  . CYSTOURETHROSCOPY     right retrograde pyelogram,manipulate stone in the renal pelvis, rt. double-j catheter.  [name] Kitchen EYE SURGERY    . KNEE SURGERY     rt knee  . LEFT HEART CATH AND CORS/GRAFTS ANGIOGRAPHY N/A 02/20/2017   Procedure: LEFT HEART CATH AND CORS/GRAFTS ANGIOGRAPHY;  Surgeon: Leonie Man, MD;  Location: [place] CV LAB;  Service: Cardiovascular;  Laterality: N/A;  . NEPHROLITHOTOMY     PERCUTANEOUS  . PARS PLANA VITRECTOMY     lt. eye,retinal photocoagulation lt. eye, membrane peel lt. eye.  [name] Kitchen PERCUTANEOUS STENT INTERVENTION Left 05/13/2014   Procedure: PERCUTANEOUS STENT INTERVENTION;  Surgeon: Wellington Hampshire, MD;  Location: [place] CATH LAB;  Service: Cardiovascular;  Laterality: Left;  COMMON ILLIAC  . re-exploratin of laminectomy  05/2008   (RT L2-3) WITH REDO MICRODISKECTOMY     Current Outpatient Medications  Medication Sig Dispense Refill  . acetaminophen (TYLENOL) 325 MG tablet Take 650 mg by mouth every 6 (six) hours as needed (pain).    [name] Kitchen amLODipine (NORVASC) 10 MG tablet Take 10 mg by mouth daily.    [name] Kitchen aspirin EC 81 MG tablet Take 81 mg by mouth at bedtime.    [name] Kitchen atorvastatin (LIPITOR) 10 MG tablet TAKE ONE TABLET BY MOUTH DAILY 90 tablet 3  . atorvastatin (LIPITOR) 20 MG tablet Take 1 tablet by mouth daily.    [name] Kitchen CREON 36000 units CPEP capsule Take 1 capsule by mouth.    . esomeprazole (NEXIUM) 40 MG capsule Take 1 capsule by mouth 2 (two) times daily.    [name] Kitchen ezetimibe (ZETIA) 10 MG tablet Take 1 tablet (10 mg total) by mouth at bedtime. 30 tablet 1  . gabapentin (NEURONTIN) 100 MG capsule Take 1 capsule by mouth as needed.    [name] Kitchen glipiZIDE (GLUCOTROL) 10 MG tablet Take 10 mg by mouth 2 (two) times daily.     . insulin glargine (LANTUS SOLOSTAR) 100 UNIT/ML injection Inject 15 Units into the skin at bedtime.     . isosorbide mononitrate (IMDUR) 60 MG 24 hr tablet Take 1 tablet (60 mg total) by mouth daily. 30 tablet 1  . lisinopril-hydrochlorothiazide  (PRINZIDE,ZESTORETIC) 20-12.5 MG per tablet Take 1 tablet by mouth daily.      . methocarbamol (ROBAXIN) 750 MG tablet Take 1 tablet (750 mg total) by mouth every 8 (eight) hours as needed (muscle spasm/pain). 15 tablet 0  . Multiple Vitamin (MULTIVITAMIN WITH MINERALS) TABS tablet Take 1 tablet by mouth daily.    . nitroGLYCERIN (NITROSTAT) 0.4 MG SL tablet Place 1 tablet (0.4 mg total) under the tongue every 5 (five) minutes as needed for chest pain. 25 tablet 3  . omeprazole (PRILOSEC) 20 MG capsule Take 20 mg by mouth daily.    [name] Kitchen  sucralfate (CARAFATE) 1 GM/10ML suspension 10cc by mouth two times a day    . tamsulosin (FLOMAX) 0.4 MG CAPS capsule Take 0.4 mg by mouth daily after supper.     No current facility-administered medications for this visit.     Allergies:   Rosuvastatin calcium; Iohexol; and Statins    Social History:  The patient  reports that he has never smoked. He has never used smokeless tobacco. He reports that he does not drink alcohol or use drugs.   Family History:  The patient's family history includes Coronary artery disease in his brother and sister; Diabetes in his brother; Heart attack in his father; Other in his mother and unknown relative.    ROS:  Please see the history of present illness.   Otherwise, review of systems are positive for none.   All other systems are reviewed and negative.    PHYSICAL EXAM: VS:  BP (!) 136/58   Pulse 64   Ht 5\' 7"  (1.702 m)   Wt 72.6 kg   SpO2 98%   BMI 25.06 kg/m  , BMI Body mass index is 25.06 kg/m. Affect appropriate Healthy:  appears stated age 21: normal Neck supple with no adenopathy JVP normal bilateral  bruits no thyromegaly Lungs clear with no wheezing and good diaphragmatic motion Heart:  S1/S2 no murmur, no rub, gallop or click PMI normal Abdomen: benighn, BS positve, no tenderness, no AAA Femoral bruist.  No HSM or HJR Distal pulses decreased below knees  No edema Neuro non-focal Skin warm and  dry No muscular weakness   EKG:  10/23/16 SR rate 21 PVC old IMI LAD 05/02/18 SR rate 64 LAD poor R wave progression no acute changes     Recent Labs: 01/19/2018: ALT 66; BUN 22; Creatinine, Ser 1.06; Hemoglobin 15.7; Platelets 342; Potassium 4.3; Sodium 135    Lipid Panel    Component Value Date/Time   CHOL 138 02/21/2017 0222   TRIG 44 02/21/2017 0222   HDL 43 02/21/2017 0222   CHOLHDL 3.2 02/21/2017 0222   VLDL 9 02/21/2017 0222   LDLCALC 86 02/21/2017 0222      Wt Readings from Last 3 Encounters:  05/02/18 72.6 kg  03/29/18 73.2 kg  01/19/18 72.6 kg         ASSESSMENT AND PLAN:  1.  Peripheral arterial disease: Status post left iliac artery stenting. He is known to have bilateral SFA occlusion with stable mild bilateral calf claudication. Continue medical therapy. ABI's decreased in mild range at rest F/U [facility]   2. Coronary artery disease cath 02/20/17 patient grafts small vessel disease On nitrates HR typically runs in 60" Without beta blocker  Start Ranexa. Lexiscan myovue to assess ischemic burden if positive repeat cath He has SL nitro and knows to go to ER for prolonged pain but has been resistant to do this   3. Hyperlipidemia: Reported intolerance to statins but hard to differentiate from claudication    4. Essential hypertension:  Well controlled.  Continue current medications and low sodium Dash type diet.    5. Bilateral carotid disease: Most recent duplex on 02/15/17  showed mild atherosclerosis.  6. 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

## 2018-05-01 NOTE — Progress Notes (Signed)
Cardiology Office Note   Date:  05/02/2018   ID:  [name], DOB 1940/05/09, MRN [id number]  PCP:  Ricardo Sheriff, MD  Cardiologist:  Dr. Johnsie Cancel  No chief complaint on file.     History of Present Illness: [facility] Le is a 78 y.o. male who presents for a follow up visit regarding  CAD and PAD. He has known history of coronary artery disease status post coronary artery bypass grafting in 2009 . He presented in 02/2015 with chest pain. Cardiac cath showed patent grafts. Also with PVD followed by Dr [name] Left CIA stent March 2016 with known bilateral SFT occlusions and 1 vessel run off. 11/14/17 right ABI .70  left .78    He is a pancreatic cancer survivor   He has history of intolerance to statins due to myalgia.  Previous lumber  spine surgery for neuropathic pain   Last cath 02/20/17 with patent grafts diffuse small vessel disease ? Microvascular angina LIMA LAD patent SVG OM1/OM2 patent SVG PDA patent   Called office 02/26/17 with elevated BP racing heart and taking a lot of SL nitro. Unable to take imdur Refused to go to ER Turns out pain is GI Had EGD with [facility] concern for recurrent pancreatic cancer ? In stomach Sees Dr Nelda Le at [facility]  He is back on Creon which upsets his stomach   Called office 3/26 complaining of chest pain Again refused to go to ER. Has also had bilateral hip and flank pain Has been placed on Creon for his pancrease Pain is exertional Nitro helps but not relieves. Had EGD last week Ok no esophagitis and stopped Creon on his own made him sick   Long discussion with him regarding options of increased medical Rx, risk stratifying with myovue or repeat Cath   Past Medical History:  Diagnosis Date  . CAD (coronary artery disease)    a. 04/2007 s/p CABG x 4  - LIMA->LAD, VG->OM1->Om2, VG->PDA;  b. 12/2008 & 06/2010 Caths - Native multivessel dzs with 4/4 patent grafts. c. cath 03/09/2015 patent grafts, unchanged from prior cath.  . Cancer [facility])     pancreatic  March 2011  . Carotid bruit    a. 07/2010 U/S- 0-39% bilat ICA stenosis  . Chest pain    Noncardiac probably related to reflux  . DDD (degenerative disc disease)    several surgeries  . GERD (gastroesophageal reflux disease)   . HNP (herniated nucleus pulposus), lumbar    a. L2-3, s/p laminotomy, microdiskectomy 02/2009  . HOH (hard of hearing)    uses amplifiers   . Nephrolithiasis   . PAD (peripheral artery disease) ([place]) 04/2014   s/p L Common Iliac Stent  . Palpitations   . 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 Pneumonia    hx of  pna  . Pure hypercholesterolemia    statin intolerance  . Type II or unspecified type diabetes mellitus without mention of complication, not stated as uncontrolled    insulin dependent  . Unspecified essential hypertension     Past Surgical History:  Procedure Laterality Date  . ABDOMINAL AORTAGRAM N/A 05/13/2014   Procedure: ABDOMINAL [name];  Surgeon: Ricardo Hampshire, MD;  Location: [place] CATH LAB;  Service: Cardiovascular;  Laterality: N/A;  . ANTERIOR CERVICAL DECOMP/DISCECTOMY FUSION N/A 07/28/2014   Procedure: ANTERIOR CERVICAL DECOMPRESSION/DISCECTOMY FUSION CERVICAL FOUR-FIVE CERVICAL FIVE-SIX ;  Surgeon: Ricardo Larsson, MD;  Location: [facility]  NEURO ORS;  Service: Neurosurgery;  Laterality: N/A;  . APPENDECTOMY    . arthroscopy right knee    . BACK SURGERY    . CARDIAC CATHETERIZATION N/A 03/09/2015   Procedure: Left Heart Cath and Cors/Grafts Angiography;  Surgeon: Ricardo Sine, MD;  Location: [place] CV LAB;  Service: Cardiovascular;  Laterality: N/A;  . CHOLECYSTECTOMY    . CORONARY ARTERY BYPASS GRAFT     X 4 2009 (LIMA to LAD, SVG to first cicumflex marginal branch with sequential SVG to second cicumflex marginal branch  and saphenous vein to posterior descending coronary artery with endoscopic,vein harvest rt. lower exttremity by RicardoOwen on March 31,2009  . CYST REMOVED      FROM SPINE  . CYSTOURETHROSCOPY     right retrograde pyelogram,manipulate stone in the renal pelvis, rt. double-j catheter.  [facility] EYE SURGERY    . KNEE SURGERY     rt knee  . LEFT HEART CATH AND CORS/GRAFTS ANGIOGRAPHY N/A 02/20/2017   Procedure: LEFT HEART CATH AND CORS/GRAFTS ANGIOGRAPHY;  Surgeon: Ricardo Man, MD;  Location: [place] CV LAB;  Service: Cardiovascular;  Laterality: N/A;  . NEPHROLITHOTOMY     PERCUTANEOUS  . PARS PLANA VITRECTOMY     lt. eye,retinal photocoagulation lt. eye, membrane peel lt. eye.  [facility] PERCUTANEOUS STENT INTERVENTION Left 05/13/2014   Procedure: PERCUTANEOUS STENT INTERVENTION;  Surgeon: Ricardo Hampshire, MD;  Location: [place] CATH LAB;  Service: Cardiovascular;  Laterality: Left;  COMMON ILLIAC  . re-exploratin of laminectomy  05/2008   (RT L2-3) WITH REDO MICRODISKECTOMY     Current Outpatient Medications  Medication Sig Dispense Refill  . acetaminophen (TYLENOL) 325 MG tablet Take 650 mg by mouth every 6 (six) hours as needed (pain).    [facility] amLODipine (NORVASC) 10 MG tablet Take 10 mg by mouth daily.    [facility] aspirin EC 81 MG tablet Take 81 mg by mouth at bedtime.    [facility] atorvastatin (LIPITOR) 10 MG tablet TAKE ONE TABLET BY MOUTH DAILY 90 tablet 3  . atorvastatin (LIPITOR) 20 MG tablet Take 1 tablet by mouth daily.    [facility] CREON 36000 units CPEP capsule Take 1 capsule by mouth.    . esomeprazole (NEXIUM) 40 MG capsule Take 1 capsule by mouth 2 (two) times daily.    [facility] ezetimibe (ZETIA) 10 MG tablet Take 1 tablet (10 mg total) by mouth at bedtime. 30 tablet 1  . gabapentin (NEURONTIN) 100 MG capsule Take 1 capsule by mouth as needed.    [facility] glipiZIDE (GLUCOTROL) 10 MG tablet Take 10 mg by mouth 2 (two) times daily.     . insulin glargine (LANTUS SOLOSTAR) 100 UNIT/ML injection Inject 15 Units into the skin at bedtime.     . isosorbide mononitrate (IMDUR) 60 MG 24 hr tablet Take 1 tablet (60 mg total) by mouth daily. 30 tablet 1  . lisinopril-hydrochlorothiazide  (PRINZIDE,ZESTORETIC) 20-12.5 MG per tablet Take 1 tablet by mouth daily.      . methocarbamol (ROBAXIN) 750 MG tablet Take 1 tablet (750 mg total) by mouth every 8 (eight) hours as needed (muscle spasm/pain). 15 tablet 0  . Multiple Vitamin (MULTIVITAMIN WITH MINERALS) TABS tablet Take 1 tablet by mouth daily.    . nitroGLYCERIN (NITROSTAT) 0.4 MG SL tablet Place 1 tablet (0.4 mg total) under the tongue every 5 (five) minutes as needed for chest pain. 25 tablet 3  . omeprazole (PRILOSEC) 20 MG capsule Take 20 mg by mouth daily.    [facility]  sucralfate (CARAFATE) 1 GM/10ML suspension 10cc by mouth two times a day    . tamsulosin (FLOMAX) 0.4 MG CAPS capsule Take 0.4 mg by mouth daily after supper.     No current facility-administered medications for this visit.     Allergies:   Rosuvastatin calcium; Iohexol; and Statins    Social History:  The patient  reports that he has never smoked. He has never used smokeless tobacco. He reports that he does not drink alcohol or use drugs.   Family History:  The patient's family history includes Coronary artery disease in his brother and sister; Diabetes in his brother; Heart attack in his father; Other in his mother and unknown relative.    ROS:  Please see the history of present illness.   Otherwise, review of systems are positive for none.   All other systems are reviewed and negative.    PHYSICAL EXAM: VS:  BP (!) 136/58   Pulse 64   Ht 5\' 7"  (1.702 m)   Wt 72.6 kg   SpO2 98%   BMI 25.06 kg/m  , BMI Body mass index is 25.06 kg/m. Affect appropriate Healthy:  appears stated age 39: normal Neck supple with no adenopathy JVP normal bilateral  bruits no thyromegaly Lungs clear with no wheezing and good diaphragmatic motion Heart:  S1/S2 no murmur, no rub, gallop or click PMI normal Abdomen: benighn, BS positve, no tenderness, no AAA Femoral bruist.  No HSM or HJR Distal pulses decreased below knees  No edema Neuro non-focal Skin warm and  dry No muscular weakness   EKG:  10/23/16 SR rate 30 PVC old IMI LAD 05/02/18 SR rate 64 LAD poor R wave progression no acute changes     Recent Labs: 01/19/2018: ALT 66; BUN 22; Creatinine, Ser 1.06; Hemoglobin 15.7; Platelets 342; Potassium 4.3; Sodium 135    Lipid Panel    Component Value Date/Time   CHOL 138 02/21/2017 0222   TRIG 44 02/21/2017 0222   HDL 43 02/21/2017 0222   CHOLHDL 3.2 02/21/2017 0222   VLDL 9 02/21/2017 0222   LDLCALC 86 02/21/2017 0222      Wt Readings from Last 3 Encounters:  05/02/18 72.6 kg  03/29/18 73.2 kg  01/19/18 72.6 kg         ASSESSMENT AND PLAN:  1.  Peripheral arterial disease: Status post left iliac artery stenting. He is known to have bilateral SFA occlusion with stable mild bilateral calf claudication. Continue medical therapy. ABI's decreased in mild range at rest F/U [place]   2. Coronary artery disease cath 02/20/17 patient grafts small vessel disease On nitrates HR typically runs in 60" Without beta blocker  Start Ranexa. Lexiscan myovue to assess ischemic burden if positive repeat cath He has SL nitro and knows to go to ER for prolonged pain but has been resistant to do this   3. Hyperlipidemia: Reported intolerance to statins but hard to differentiate from claudication    4. Essential hypertension:  Well controlled.  Continue current medications and low sodium Dash type diet.    5. Bilateral carotid disease: Most recent duplex on 02/15/17  showed mild atherosclerosis.  6. Pancreatic cancer - recent chest pain GI in nature with recent EGD Last seen [facility] 02/19/18 CT negative Thought to be stable with T1N1 neuroendocrine carcinoma post resection of tail of pancrease and splenectomy   Time spent directly with patient and exam for complex cardiac decision making as above 45 minutes  Jenkins Rouge

## 2018-05-02 ENCOUNTER — Ambulatory Visit: Payer: PPO | Admitting: Cardiovascular Disease

## 2018-05-02 ENCOUNTER — Other Ambulatory Visit: Payer: Self-pay

## 2018-05-02 ENCOUNTER — Encounter: Payer: Self-pay | Admitting: Cardiovascular Disease

## 2018-05-02 VITALS — BP 136/58 | HR 64 | Ht 67.0 in | Wt 160.0 lb

## 2018-05-02 DIAGNOSIS — R079 Chest pain, unspecified: Secondary | ICD-10-CM

## 2018-05-02 DIAGNOSIS — I739 Peripheral vascular disease, unspecified: Secondary | ICD-10-CM

## 2018-05-02 DIAGNOSIS — I2510 Atherosclerotic heart disease of native coronary artery without angina pectoris: Secondary | ICD-10-CM | POA: Diagnosis not present

## 2018-05-02 DIAGNOSIS — I10 Essential (primary) hypertension: Secondary | ICD-10-CM

## 2018-05-02 DIAGNOSIS — E785 Hyperlipidemia, unspecified: Secondary | ICD-10-CM

## 2018-05-02 MED ORDER — NITROGLYCERIN 0.4 MG SL SUBL: 0.4000 mg | SUBLINGUAL_TABLET | SUBLINGUAL | 3 refills | Status: DC | PRN

## 2018-05-02 MED ORDER — RANOLAZINE ER 500 MG PO TB12: 500.0000 mg | ORAL_TABLET | Freq: Two times a day (BID) | ORAL | 11 refills | Status: DC

## 2018-05-02 NOTE — Patient Instructions (Signed)
Medication Instructions:  Your physician has recommended you make the following change in your medication:  1-START Ranexa 500 mg by mouth twice daily  If you need a refill on your cardiac medications before your next appointment, please call your pharmacy.   Lab work:  If you have labs (blood work) drawn today and your tests are completely normal, you will receive your results only by: [name] Kitchen MyChart Message (if you have MyChart) OR . A paper copy in the mail If you have any lab test that is abnormal or we need to change your treatment, we will call you to review the results.  Testing/Procedures: Your physician has requested that you have a lexiscan myoview. For further information please visit [email or web address]. Please follow instruction sheet, as given.  Follow-Up: At [facility], you and your health needs are our priority.  As part of our continuing mission to provide you with exceptional heart care, we have created designated Provider Care Teams.  These Care Teams include your primary Cardiologist (physician) and Advanced Practice Providers (APPs -  Physician Assistants and Nurse Practitioners) who all work together to provide you with the care you need, when you need it. You will need a follow up appointment in 6 to 8 weeks.  Please call our office 2 months in advance to schedule this appointment.  You may see Jenkins Rouge, MD or one of the following Advanced Practice Providers on your designated Care Team:   Truitt Merle, NP Cecilie Kicks, NP . Kathyrn Drown, NP

## 2018-05-07 DIAGNOSIS — R1013 Epigastric pain: Secondary | ICD-10-CM | POA: Diagnosis not present

## 2018-05-07 DIAGNOSIS — K5909 Other constipation: Secondary | ICD-10-CM | POA: Diagnosis not present

## 2018-05-07 DIAGNOSIS — K219 Gastro-esophageal reflux disease without esophagitis: Secondary | ICD-10-CM | POA: Diagnosis not present

## 2018-05-13 ENCOUNTER — Telehealth: Payer: Self-pay | Admitting: Cardiovascular Disease

## 2018-05-13 NOTE — Telephone Encounter (Signed)
Spoke with pt who reports his heart is pounding in the middle of his chest with radiation up into his neck.  He is having spells of weakness and dizziness.  He sounds very SOB.  He reports this pain/pounding and SOB comes and goes on it's own.  Sometimes rest helps.  He has used 8 to 10 SL Ntg without relief.  Advised to call 911 to be taken to the ED for evaluation.  Pt refused.  He is requesting his myoview stress test be move up from 4/22 to evaluate.  Advised again, if he is having active chest pain he will need to be seen in the ED for evaluation.  We can not do anything in the office and would not give a patient with active chest pain a stress test in the office.  Pt refused ED again and states he is going to lay down and hope it goes away.  Will forward this to Dr Johnsie Cancel and his nurse for f/u and to address pt's further needs.

## 2018-05-13 NOTE — Telephone Encounter (Signed)
New Message   Patients wife is calling on his behalf stating that her husband has been very weak and that his heart has been pounding. But today is HR has been low. HR today 50. BP 130/49   Patient c/o Palpitations:  High priority if patient c/o lightheadedness, shortness of breath, or chest pain  1) How long have you had palpitations/irregular HR/ Afib? Are you having the symptoms now? At least a month.   2) Are you currently experiencing lightheadedness, SOB or CP?  Some chest pains  3) Do you have a history of afib (atrial fibrillation) or irregular heart rhythm? no  4) Have you checked your BP or HR? (document readings if available): BP 130/49 HR 50  5) Are you experiencing any other symptoms? Weak, pain in the chest and neck on the left hand side.

## 2018-05-13 NOTE — Telephone Encounter (Signed)
Try to move myovue up needs to be done we have been round and round with this about him not going to ER

## 2018-05-14 DIAGNOSIS — I10 Essential (primary) hypertension: Secondary | ICD-10-CM | POA: Diagnosis not present

## 2018-05-14 DIAGNOSIS — E1165 Type 2 diabetes mellitus with hyperglycemia: Secondary | ICD-10-CM | POA: Diagnosis not present

## 2018-05-15 NOTE — Telephone Encounter (Signed)
Robin spoken with the patient nuclear r/s to 4.2.20 @ 7:45am

## 2018-05-16 ENCOUNTER — Ambulatory Visit (HOSPITAL_COMMUNITY): Payer: PPO | Attending: Internal Medicine

## 2018-05-16 ENCOUNTER — Other Ambulatory Visit: Payer: Self-pay

## 2018-05-16 DIAGNOSIS — E785 Hyperlipidemia, unspecified: Secondary | ICD-10-CM | POA: Diagnosis not present

## 2018-05-16 DIAGNOSIS — I2510 Atherosclerotic heart disease of native coronary artery without angina pectoris: Secondary | ICD-10-CM | POA: Diagnosis not present

## 2018-05-16 DIAGNOSIS — R079 Chest pain, unspecified: Secondary | ICD-10-CM | POA: Insufficient documentation

## 2018-05-16 DIAGNOSIS — J410 Simple chronic bronchitis: Secondary | ICD-10-CM | POA: Diagnosis not present

## 2018-05-16 DIAGNOSIS — I10 Essential (primary) hypertension: Secondary | ICD-10-CM | POA: Diagnosis not present

## 2018-05-16 DIAGNOSIS — M545 Low back pain: Secondary | ICD-10-CM | POA: Diagnosis not present

## 2018-05-16 DIAGNOSIS — Z1389 Encounter for screening for other disorder: Secondary | ICD-10-CM | POA: Diagnosis not present

## 2018-05-16 DIAGNOSIS — G4733 Obstructive sleep apnea (adult) (pediatric): Secondary | ICD-10-CM | POA: Diagnosis not present

## 2018-05-16 DIAGNOSIS — Z7982 Long term (current) use of aspirin: Secondary | ICD-10-CM | POA: Diagnosis not present

## 2018-05-16 DIAGNOSIS — I340 Nonrheumatic mitral (valve) insufficiency: Secondary | ICD-10-CM | POA: Diagnosis not present

## 2018-05-16 DIAGNOSIS — I252 Old myocardial infarction: Secondary | ICD-10-CM | POA: Diagnosis not present

## 2018-05-16 DIAGNOSIS — I700 Atherosclerosis of aorta: Secondary | ICD-10-CM | POA: Diagnosis not present

## 2018-05-16 DIAGNOSIS — K315 Obstruction of duodenum: Secondary | ICD-10-CM | POA: Diagnosis not present

## 2018-05-16 DIAGNOSIS — K1120 Sialoadenitis, unspecified: Secondary | ICD-10-CM | POA: Diagnosis not present

## 2018-05-16 DIAGNOSIS — Z1331 Encounter for screening for depression: Secondary | ICD-10-CM | POA: Diagnosis not present

## 2018-05-16 DIAGNOSIS — I739 Peripheral vascular disease, unspecified: Secondary | ICD-10-CM | POA: Diagnosis not present

## 2018-05-16 DIAGNOSIS — I119 Hypertensive heart disease without heart failure: Secondary | ICD-10-CM | POA: Diagnosis not present

## 2018-05-16 DIAGNOSIS — K8050 Calculus of bile duct without cholangitis or cholecystitis without obstruction: Secondary | ICD-10-CM | POA: Diagnosis not present

## 2018-05-16 DIAGNOSIS — R3121 Asymptomatic microscopic hematuria: Secondary | ICD-10-CM | POA: Diagnosis not present

## 2018-05-16 DIAGNOSIS — Z951 Presence of aortocoronary bypass graft: Secondary | ICD-10-CM | POA: Diagnosis not present

## 2018-05-16 DIAGNOSIS — Z0001 Encounter for general adult medical examination with abnormal findings: Secondary | ICD-10-CM | POA: Diagnosis not present

## 2018-05-16 LAB — MYOCARDIAL PERFUSION IMAGING
LV dias vol: 78 mL (ref 62–150)
LV sys vol: 26 mL
Peak HR: 85 {beats}/min
Rest HR: 62 {beats}/min
SDS: 11
SRS: 4
SSS: 15
TID: 0.92

## 2018-05-16 MED ORDER — TECHNETIUM TC 99M TETROFOSMIN IV KIT
10.1000 | PACK | Freq: Once | INTRAVENOUS | Status: AC | PRN
  Administered 2018-05-16: 10.1 via INTRAVENOUS
  Filled 2018-05-16: qty 11

## 2018-05-16 MED ORDER — TECHNETIUM TC 99M TETROFOSMIN IV KIT
31.9000 | PACK | Freq: Once | INTRAVENOUS | Status: AC | PRN
  Administered 2018-05-16: 31.9 via INTRAVENOUS
  Filled 2018-05-16: qty 32

## 2018-05-16 MED ORDER — REGADENOSON 0.4 MG/5ML IV SOLN
0.4000 mg | Freq: Once | INTRAVENOUS | Status: AC
  Administered 2018-05-16: 0.4 mg via INTRAVENOUS

## 2018-05-27 ENCOUNTER — Telehealth: Payer: Self-pay | Admitting: Nurse Practitioner

## 2018-05-27 ENCOUNTER — Telehealth: Payer: Self-pay | Admitting: *Deleted

## 2018-05-27 MED ORDER — PREDNISONE 50 MG PO TABS: ORAL_TABLET | ORAL | 0 refills | Status: DC

## 2018-05-27 NOTE — Telephone Encounter (Signed)
Set up cath for tomorrow or Wendsday he can get labs at hospital just schedule it latter in day

## 2018-05-27 NOTE — Telephone Encounter (Signed)
Pt contacted pre-catheterization scheduled at [facility] for: Tuesday May 28, 2018 10:30AM Verified arrival time and place: [place] En[facility]ance A at: 8 AM-needs lab  No solid food after midnight prior to cath, clear liquids until 5 AM day of procedure.  Contrast allergy: yes-reviewed 13 hour prednisone and benadryl prep: Prednisone 50 mg 05/27/18 9:30PM Prednisone 50 mg 05/28/18 3:30AM Prednisone 50mg  and benadryl 50 mg just prior to leaving for hospital AM 05/28/18. Pt advised not to drive.  Hold: Glipizide-AM of procedure. 1/2 Insulin PM prior to procedure. Lisinopril/HCT-AM of procedure.   Except hold medications AM meds can be  taken pre-cath with sip of water including: ASA 81 mg Prednisone 50 mg Benadryl 50 mg  Confirmed patient has responsible person to drive home post procedure and observe 24 hours after arriving home: yes      Cardiac Questionnaire:     _____________   [id number] Pre-Screening Questions:  . Do you currently have a fever? no . Have you recently travelled on a cruise, internationally, or to [place], [place], [place], [place], [place], or [place], [place] [employer]) ? no . Have you been in contact with someone that is currently pending confirmation of Covid19 testing or has been confirmed to have the [place] virus? no  . Are you currently experiencing fatigue or cough? No . Are you currently experiencing new or worsening shortness of breath at rest or with minimal activity? no . Have you been in contact with someone that was recently sick with fever/cough/fatigue? no   Pt advised due to Covid-19 pandemic, [facility] is restricting visitors and only patients should present for check-in prior to their procedure. People will not be allowed to enter [facility] with the patient. At this time [facility] is not allowing visitors to all [facility] campuses.    Patient's care can be discussed remotely. If a patient requests/gives permission, provider can  discuss patient's care by remote communication with family/caregiver.

## 2018-05-27 NOTE — Telephone Encounter (Signed)
Patient's wife calling stating patient needs a heart cath. Patient complaining of having chest pain every time he gets up and walks. Patient's wife stated patient just keeps getting worse. Tried to convince patient's wife to take him to the ED. Patient's wife is refusing to take patient to the ED with [place] going on. She stated the patient needs a heart cath and Dr. Johnsie Cancel should set it up for him. Informed patient's wife that on patient's myoview results, Dr. Johnsie Cancel stated he did not needs a cath. Patient's wife stated that this has happen before and then patient ended up having bypass. Will forward to Dr. Johnsie Cancel for advisement.

## 2018-05-27 NOTE — Telephone Encounter (Signed)
Called patient's wife back. Set patient up for heart cath tomorrow with Dr. Irish Lack. Went over instructions with patient's wife. She read back instructions and verbalized understanding.

## 2018-05-27 NOTE — Telephone Encounter (Signed)
New Message   Pts wife says the pts symptoms are getting worse When he is walking a short distance he will have some chest pain  She would like for him to have a cath done    Please call

## 2018-05-28 ENCOUNTER — Other Ambulatory Visit: Payer: Self-pay

## 2018-05-28 ENCOUNTER — Ambulatory Visit (HOSPITAL_COMMUNITY)
Admission: RE | Admit: 2018-05-28 | Discharge: 2018-05-28 | Disposition: A | Discharge status: Home / Self Care | Payer: PPO | Attending: Interventional Cardiology | Admitting: Interventional Cardiology

## 2018-05-28 ENCOUNTER — Other Ambulatory Visit: Payer: Self-pay | Admitting: Cardiovascular Disease

## 2018-05-28 ENCOUNTER — Encounter (HOSPITAL_COMMUNITY)
Admission: RE | Disposition: A | Discharge status: Home / Self Care | Payer: Self-pay | Source: Home / Self Care | Attending: Interventional Cardiology

## 2018-05-28 DIAGNOSIS — E1151 Type 2 diabetes mellitus with diabetic peripheral angiopathy without gangrene: Secondary | ICD-10-CM | POA: Diagnosis not present

## 2018-05-28 DIAGNOSIS — Z79899 Other long term (current) drug therapy: Secondary | ICD-10-CM | POA: Insufficient documentation

## 2018-05-28 DIAGNOSIS — I25110 Atherosclerotic heart disease of native coronary artery with unstable angina pectoris: Secondary | ICD-10-CM

## 2018-05-28 DIAGNOSIS — E7800 Pure hypercholesterolemia, unspecified: Secondary | ICD-10-CM | POA: Diagnosis not present

## 2018-05-28 DIAGNOSIS — Z7982 Long term (current) use of aspirin: Secondary | ICD-10-CM | POA: Diagnosis not present

## 2018-05-28 DIAGNOSIS — Z888 Allergy status to other drugs, medicaments and biological substances status: Secondary | ICD-10-CM | POA: Insufficient documentation

## 2018-05-28 DIAGNOSIS — Z794 Long term (current) use of insulin: Secondary | ICD-10-CM | POA: Insufficient documentation

## 2018-05-28 DIAGNOSIS — I10 Essential (primary) hypertension: Secondary | ICD-10-CM | POA: Diagnosis not present

## 2018-05-28 DIAGNOSIS — K219 Gastro-esophageal reflux disease without esophagitis: Secondary | ICD-10-CM | POA: Insufficient documentation

## 2018-05-28 DIAGNOSIS — I2510 Atherosclerotic heart disease of native coronary artery without angina pectoris: Secondary | ICD-10-CM

## 2018-05-28 DIAGNOSIS — E785 Hyperlipidemia, unspecified: Secondary | ICD-10-CM | POA: Insufficient documentation

## 2018-05-28 DIAGNOSIS — Z951 Presence of aortocoronary bypass graft: Secondary | ICD-10-CM | POA: Insufficient documentation

## 2018-05-28 DIAGNOSIS — Z8507 Personal history of malignant neoplasm of pancreas: Secondary | ICD-10-CM | POA: Diagnosis not present

## 2018-05-28 HISTORY — PX: LEFT HEART CATH AND CORS/GRAFTS ANGIOGRAPHY: CATH118250

## 2018-05-28 LAB — BASIC METABOLIC PANEL WITH GFR
Anion gap: 18 — ABNORMAL HIGH (ref 5–15)
BUN: 28 mg/dL — ABNORMAL HIGH (ref 8–23)
CO2: 22 mmol/L (ref 22–32)
Calcium: 10.3 mg/dL (ref 8.9–10.3)
Chloride: 94 mmol/L — ABNORMAL LOW (ref 98–111)
Creatinine, Ser: 1.2 mg/dL (ref 0.61–1.24)
GFR calc Af Amer: >60 mL/min
GFR calc non Af Amer: 58 mL/min — ABNORMAL LOW
Glucose, Bld: 349 mg/dL — ABNORMAL HIGH (ref 70–99)
Potassium: 4.5 mmol/L (ref 3.5–5.1)
Sodium: 134 mmol/L — ABNORMAL LOW (ref 135–145)

## 2018-05-28 LAB — CBC
HCT: 48.2 % (ref 39.0–52.0)
Hemoglobin: 16.2 g/dL (ref 13.0–17.0)
MCH: 31.3 pg (ref 26.0–34.0)
MCHC: 33.6 g/dL (ref 30.0–36.0)
MCV: 93.2 fL (ref 80.0–100.0)
Platelets: 295 10*3/uL (ref 150–400)
RBC: 5.17 MIL/uL (ref 4.22–5.81)
RDW: 12.2 % (ref 11.5–15.5)
WBC: 9.7 10*3/uL (ref 4.0–10.5)
nRBC: 0 % (ref 0.0–0.2)

## 2018-05-28 LAB — GLUCOSE, CAPILLARY
Glucose-Capillary: 350 mg/dL — ABNORMAL HIGH (ref 70–99)
Glucose-Capillary: 319 mg/dL — ABNORMAL HIGH (ref 70–99)
Glucose-Capillary: 264 mg/dL — ABNORMAL HIGH (ref 70–99)

## 2018-05-28 SURGERY — LEFT HEART CATH AND CORS/GRAFTS ANGIOGRAPHY
Anesthesia: LOCAL

## 2018-05-28 MED ORDER — INSULIN ASPART 100 UNIT/ML ~~LOC~~ SOLN
5.0000 [IU] | Freq: Once | SUBCUTANEOUS | Status: AC
  Administered 2018-05-28: 5 [IU] via SUBCUTANEOUS
  Filled 2018-05-28: qty 0.05

## 2018-05-28 MED ORDER — ASPIRIN 81 MG PO CHEW: 81.0000 mg | CHEWABLE_TABLET | ORAL | Status: DC

## 2018-05-28 MED ORDER — MIDAZOLAM HCL 2 MG/2ML IJ SOLN
INTRAMUSCULAR | Status: DC | PRN
  Administered 2018-05-28: 1 mg via INTRAVENOUS

## 2018-05-28 MED ORDER — INSULIN ASPART 100 UNIT/ML ~~LOC~~ SOLN
5.0000 [IU] | Freq: Once | SUBCUTANEOUS | Status: AC
  Administered 2018-05-28: 5 [IU] via SUBCUTANEOUS
  Filled 2018-05-28: qty 1
  Filled 2018-05-28: qty 0.05

## 2018-05-28 MED ORDER — HEPARIN SODIUM (PORCINE) 1000 UNIT/ML IJ SOLN
INTRAMUSCULAR | Status: DC | PRN
  Administered 2018-05-28: 3500 [IU] via INTRAVENOUS

## 2018-05-28 MED ORDER — HEPARIN (PORCINE) IN NACL 1000-0.9 UT/500ML-% IV SOLN
INTRAVENOUS | Status: AC
  Filled 2018-05-28: qty 1000

## 2018-05-28 MED ORDER — SODIUM CHLORIDE 0.9% FLUSH: 3.0000 mL | INTRAVENOUS | Status: DC | PRN

## 2018-05-28 MED ORDER — ONDANSETRON HCL 4 MG/2ML IJ SOLN: 4.0000 mg | Freq: Four times a day (QID) | INTRAMUSCULAR | Status: DC | PRN

## 2018-05-28 MED ORDER — HEPARIN (PORCINE) IN NACL 1000-0.9 UT/500ML-% IV SOLN
INTRAVENOUS | Status: DC | PRN
  Administered 2018-05-28: 500 mL

## 2018-05-28 MED ORDER — FENTANYL CITRATE (PF) 100 MCG/2ML IJ SOLN
INTRAMUSCULAR | Status: AC
  Filled 2018-05-28: qty 2

## 2018-05-28 MED ORDER — HEPARIN SODIUM (PORCINE) 1000 UNIT/ML IJ SOLN
INTRAMUSCULAR | Status: AC
  Filled 2018-05-28: qty 1

## 2018-05-28 MED ORDER — SODIUM CHLORIDE 0.9% FLUSH: 3.0000 mL | Freq: Two times a day (BID) | INTRAVENOUS | Status: DC

## 2018-05-28 MED ORDER — IOHEXOL 350 MG/ML SOLN
INTRAVENOUS | Status: DC | PRN
  Administered 2018-05-28: 85 mL via INTRA_ARTERIAL

## 2018-05-28 MED ORDER — INSULIN ASPART 100 UNIT/ML ~~LOC~~ SOLN
SUBCUTANEOUS | Status: AC
  Filled 2018-05-28: qty 1

## 2018-05-28 MED ORDER — FENTANYL CITRATE (PF) 100 MCG/2ML IJ SOLN
INTRAMUSCULAR | Status: DC | PRN
  Administered 2018-05-28: 25 ug via INTRAVENOUS

## 2018-05-28 MED ORDER — SODIUM CHLORIDE 0.9 % IV SOLN: INTRAVENOUS | Status: AC

## 2018-05-28 MED ORDER — VERAPAMIL HCL 2.5 MG/ML IV SOLN
INTRAVENOUS | Status: DC | PRN
  Administered 2018-05-28: 10:00:00 10 mL via INTRA_ARTERIAL

## 2018-05-28 MED ORDER — MIDAZOLAM HCL 2 MG/2ML IJ SOLN
INTRAMUSCULAR | Status: AC
  Filled 2018-05-28: qty 2

## 2018-05-28 MED ORDER — ACETAMINOPHEN 325 MG PO TABS
650.0000 mg | ORAL_TABLET | ORAL | Status: DC | PRN
  Administered 2018-05-28: 13:00:00 650 mg via ORAL
  Filled 2018-05-28: qty 2

## 2018-05-28 MED ORDER — NITROGLYCERIN 0.4 MG SL SUBL
SUBLINGUAL_TABLET | SUBLINGUAL | Status: AC
  Administered 2018-05-28: 08:00:00 0.4 mg via SUBLINGUAL
  Filled 2018-05-28: qty 1

## 2018-05-28 MED ORDER — SODIUM CHLORIDE 0.9 % WEIGHT BASED INFUSION: 3.0000 mL/kg/h | INTRAVENOUS | Status: AC

## 2018-05-28 MED ORDER — SODIUM CHLORIDE 0.9 % IV SOLN: 250.0000 mL | INTRAVENOUS | Status: DC | PRN

## 2018-05-28 MED ORDER — SODIUM CHLORIDE 0.9 % WEIGHT BASED INFUSION: 1.0000 mL/kg/h | INTRAVENOUS | Status: DC

## 2018-05-28 MED ORDER — LABETALOL HCL 5 MG/ML IV SOLN: 10.0000 mg | INTRAVENOUS | Status: DC | PRN

## 2018-05-28 MED ORDER — HYDRALAZINE HCL 20 MG/ML IJ SOLN: 10.0000 mg | INTRAMUSCULAR | Status: DC | PRN

## 2018-05-28 MED ORDER — NITROGLYCERIN 0.4 MG SL SUBL
0.4000 mg | SUBLINGUAL_TABLET | SUBLINGUAL | Status: DC | PRN
  Administered 2018-05-28: 0.4 mg via SUBLINGUAL

## 2018-05-28 MED ORDER — VERAPAMIL HCL 2.5 MG/ML IV SOLN
INTRAVENOUS | Status: AC
  Filled 2018-05-28: qty 2

## 2018-05-28 MED ORDER — LIDOCAINE HCL (PF) 1 % IJ SOLN
INTRAMUSCULAR | Status: AC
  Filled 2018-05-28: qty 30

## 2018-05-28 MED ORDER — SODIUM CHLORIDE 0.9 % WEIGHT BASED INFUSION
3.0000 mL/kg/h | INTRAVENOUS | Status: AC
  Administered 2018-05-28: 3 mL/kg/h via INTRAVENOUS

## 2018-05-28 MED ORDER — LIDOCAINE HCL (PF) 1 % IJ SOLN
INTRAMUSCULAR | Status: DC | PRN
  Administered 2018-05-28: 2 mL

## 2018-05-28 SURGICAL SUPPLY — 12 items
CATH INFINITI 5FR MULTPACK ANG (CATHETERS) ×1 IMPLANT
COVER DOME SNAP 22 D (MISCELLANEOUS) ×1 IMPLANT
DEVICE RAD TR BAND REGULAR (VASCULAR PRODUCTS) ×1 IMPLANT
ELECT DEFIB PAD ADLT CADENCE (PAD) ×1 IMPLANT
GLIDESHEATH SLEND SS 6F .021 (SHEATH) ×1 IMPLANT
GUIDEWIRE INQWIRE 1.5J.035X260 (WIRE) IMPLANT
INQWIRE 1.5J .035X260CM (WIRE) ×2
KIT HEART LEFT (KITS) ×2 IMPLANT
PACK CARDIAC CATHETERIZATION (CUSTOM PROCEDURE TRAY) ×2 IMPLANT
SYR MEDRAD MARK 7 150ML (SYRINGE) ×2 IMPLANT
TRANSDUCER W/STOPCOCK (MISCELLANEOUS) ×2 IMPLANT
TUBING CIL FLEX 10 FLL-RA (TUBING) ×2 IMPLANT

## 2018-05-28 NOTE — Discharge Instructions (Signed)
Radial Site Care ° °This sheet gives you information about how to care for yourself after your procedure. Your health care provider may also give you more specific instructions. If you have problems or questions, contact your health care provider. °What can I expect after the procedure? °After the procedure, it is common to have: °· Bruising and tenderness at the catheter insertion area. °Follow these instructions at home: °Medicines °· Take over-the-counter and prescription medicines only as told by your health care provider. °Insertion site care °· Follow instructions from your health care provider about how to take care of your insertion site. Make sure you: °? Wash your hands with soap and water before you change your bandage (dressing). If soap and water are not available, use hand sanitizer. °? Change your dressing as told by your health care provider. °? Leave stitches (sutures), skin glue, or adhesive strips in place. These skin closures may need to stay in place for 2 weeks or longer. If adhesive strip edges start to loosen and curl up, you may trim the loose edges. Do not remove adhesive strips completely unless your health care provider tells you to do that. °· Check your insertion site every day for signs of infection. Check for: °? Redness, swelling, or pain. °? Fluid or blood. °? Pus or a bad smell. °? Warmth. °· Do not take baths, swim, or use a hot tub until your health care provider approves. °· You may shower 24-48 hours after the procedure, or as directed by your health care provider. °? Remove the dressing and gently wash the site with plain soap and water. °? Pat the area dry with a clean towel. °? Do not rub the site. That could cause bleeding. °· Do not apply powder or lotion to the site. °Activity ° °· For 24 hours after the procedure, or as directed by your health care provider: °? Do not flex or bend the affected arm. °? Do not push or pull heavy objects with the affected arm. °? Do not  drive yourself home from the hospital or clinic. You may drive 24 hours after the procedure unless your health care provider tells you not to. °? Do not operate machinery or power tools. °· Do not lift anything that is heavier than 10 lb (4.5 kg), or the limit that you are told, until your health care provider says that it is safe. °· Ask your health care provider when it is okay to: °? Return to work or school. °? Resume usual physical activities or sports. °? Resume sexual activity. °General instructions °· If the catheter site starts to bleed, raise your arm and put firm pressure on the site. If the bleeding does not stop, get help right away. This is a medical emergency. °· If you went home on the same day as your procedure, a responsible adult should be with you for the first 24 hours after you arrive home. °· Keep all follow-up visits as told by your health care provider. This is important. °Contact a health care provider if: °· You have a fever. °· You have redness, swelling, or yellow drainage around your insertion site. °Get help right away if: °· You have unusual pain at the radial site. °· The catheter insertion area swells very fast. °· The insertion area is bleeding, and the bleeding does not stop when you hold steady pressure on the area. °· Your arm or hand becomes pale, cool, tingly, or numb. °These symptoms may represent a serious problem   that is an emergency. Do not wait to see if the symptoms will go away. Get medical help right away. Call your local emergency services (911 in the [place] Do not drive yourself to the hospital. °Summary °· After the procedure, it is common to have bruising and tenderness at the site. °· Follow instructions from your health care provider about how to take care of your radial site wound. Check the wound every day for signs of infection. °· Do not lift anything that is heavier than 10 lb (4.5 kg), or the limit that you are told, until your health care provider says  that it is safe. °This information is not intended to replace advice given to you by your health care provider. Make sure you discuss any questions you have with your health care provider. °Document Released: 03/04/2010 Document Revised: 03/07/2017 Document Reviewed: 03/07/2017 °Elsevier Interactive Patient Education © 2019 [employer] ° °

## 2018-05-28 NOTE — Progress Notes (Addendum)
C/o cp soon after arriving, pt was wc into room, [place] 2L applied, EKG was obtained, IV access, nitro avail available, Anderson Malta rn/ cath lab  Was informed. CP was 7/10 on entering, currently 4/10, glucose 350 , jennifer rn informed.

## 2018-05-28 NOTE — Interval H&P Note (Signed)
History and Physical Interval Note:  05/28/2018 9:17 AM  [facility]  has presented today for cardiac cath with the diagnosis of unstable angina.  The various methods of treatment have been discussed with the patient and family. After consideration of risks, benefits and other options for treatment, the patient has consented to  Procedure(s): LEFT HEART CATH AND CORS/GRAFTS ANGIOGRAPHY (N/A) as a surgical intervention.  The patient's history has been reviewed, patient examined, no change in status, stable for surgery.  I have reviewed the patient's chart and labs.  Questions were answered to the patient's satisfaction.    Cath Lab Visit (complete for each Cath Lab visit)  Clinical Evaluation Leading to the Procedure:   ACS: No.  Non-ACS:    Anginal Classification: CCS III  Anti-ischemic medical therapy: Maximal Therapy (2 or more classes of medications)  Non-Invasive Test Results: Low-risk stress test findings: cardiac mortality <1%/year  Prior CABG: Previous CABG         Lauree Chandler

## 2018-05-28 NOTE — Progress Notes (Signed)
Pt states that pain has decreased to a 2/10 "not really anything". VSS. States it always goes away when sits or lays down.

## 2018-05-29 ENCOUNTER — Encounter (HOSPITAL_COMMUNITY): Payer: Self-pay | Admitting: Cardiovascular Disease

## 2018-06-03 ENCOUNTER — Telehealth: Payer: Self-pay | Admitting: *Deleted

## 2018-06-03 NOTE — Telephone Encounter (Signed)
Pt stated stomach was burning since pt had to take benadryl for pre cath due to dye allergies. Pt is waiting for [facility] to call pt back.  Pt stated had H Pylori before and hope this is not it.  Pt stated if GI does not call pt  back pt will have to go somewhere to be assessed.   Pt state Heart wise everything seems ok.     Virtual Visit Pre-Appointment Phone Call  Steps For Call:  1. Confirm consent - "In the setting of the current Covid19 crisis, you are scheduled for a (phone or video) visit with your provider on (Monday, April 27) at (8:00 am).  Just as we do with many in-office visits, in order for you to participate in this visit, we must obtain consent.  If you'd like, I can send this to your mychart (if signed up) or email for you to review.  Otherwise, I can obtain your verbal consent now.  All virtual visits are billed to your insurance company just like a normal visit would be.  By agreeing to a virtual visit, we'd like you to understand that the technology does not allow for your provider to perform an examination, and thus may limit your provider's ability to fully assess your condition. If your provider identifies any concerns that need to be evaluated in person, we will make arrangements to do so.  Finally, though the technology is pretty good, we cannot assure that it will always work on either your or our end, and in the setting of a video visit, we may have to convert it to a phone-only visit.  In either situation, we cannot ensure that we have a secure connection.  Are you willing to proceed?" STAFF: Did the patient verbally acknowledge consent to telehealth visit? Document YES/NO here: YES  2. Confirm the BEST phone number to call the day of the visit by including in appointment notes  3. Give patient instructions for WebEx/MyChart download to smartphone as below or Doximity/Doxy.me if video visit (depending on what platform provider is using)  4. Advise patient to be prepared  with their blood pressure, heart rate, weight, any heart rhythm information, their current medicines, and a piece of paper and pen handy for any instructions they may receive the day of their visit  5. Inform patient they will receive a phone call 15 minutes prior to their appointment time (may be from unknown caller ID) so they should be prepared to answer  6. Confirm that appointment type is correct in Epic appointment notes (VIDEO vs PHONE)     TELEPHONE CALL NOTE  Ricardo Le has been deemed a candidate for a follow-up tele-health visit to limit community exposure during the Covid-19 pandemic. I spoke with the patient via phone to ensure availability of phone/video source, confirm preferred email & phone number, and discuss instructions and expectations.  I reminded Ricardo Le to be prepared with any vital sign and/or heart rhythm information that could potentially be obtained via home monitoring, at the time of his visit. I reminded Ricardo Le to expect a phone call at the time of his visit if his visit.  Shatana Saxton Avanell Shackleton 06/03/2018 1:27 PM   INSTRUCTIONS FOR DOWNLOADING THE [place] APP TO SMARTPHONE  - If Apple, ask patient to go to App Store and type in WebEx in the search bar. [employer], the blue/green circle. If Android, go to [employer] and type in [employer]  in the search bar. The app is free but as with any other app downloads, their phone may require them to verify saved payment information or Apple/Android password.  - The patient does NOT have to create an account. - On the day of the visit, the assist will walk the patient through joining the meeting with the meeting number/password.  INSTRUCTIONS FOR DOWNLOADING THE MYCHART APP TO SMARTPHONE  - The patient must first make sure to have activated MyChart and know their login information - If Apple, go to [employer] and type in MyChart in the search bar and download the app. If  Android, ask patient to go to [employer] and type in [place] in the search bar and download the app. The app is free but as with any other app downloads, their phone may require them to verify saved payment information or Apple/Android password.  - The patient will need to then log into the app with their MyChart username and password, and select [facility] as their healthcare provider to link the account. When it is time for your visit, go to the MyChart app, find appointments, and click Begin Video Visit. Be sure to Select Allow for your device to access the Microphone and Camera for your visit. You will then be connected, and your provider will be with you shortly.  **If they have any issues connecting, or need assistance please contact [facility] [phone])**  **If using a computer, in order to ensure the best quality for their visit they will need to use either of the following Internet Browsers: [employer], or [employer].  IF USING DOXIMITY or DOXY.ME - The patient will receive a link just prior to their visit, either by text or email (to be determined day of appointment depending on if it's doxy.me or Doximity).     FULL LENGTH CONSENT FOR TELE-HEALTH VISIT   I hereby voluntarily request, consent and authorize [facility] and its employed or contracted physicians, physician assistants, nurse practitioners or other licensed health care professionals (the Practitioner), to provide me with telemedicine health care services (the "Services") as deemed necessary by the treating Practitioner. I acknowledge and consent to receive the Services by the Practitioner via telemedicine. I understand that the telemedicine visit will involve communicating with the Practitioner through live audiovisual communication technology and the disclosure of certain medical information by electronic transmission. I acknowledge that I have been given the opportunity to request an  in-person assessment or other available alternative prior to the telemedicine visit and am voluntarily participating in the telemedicine visit.  I understand that I have the right to withhold or withdraw my consent to the use of telemedicine in the course of my care at any time, without affecting my right to future care or treatment, and that the Practitioner or I may terminate the telemedicine visit at any time. I understand that I have the right to inspect all information obtained and/or recorded in the course of the telemedicine visit and may receive copies of available information for a reasonable fee.  I understand that some of the potential risks of receiving the Services via telemedicine include:  [name] Kitchen Delay or interruption in medical evaluation due to technological equipment failure or disruption; . Information transmitted may not be sufficient (e.g. poor resolution of images) to allow for appropriate medical decision making by the Practitioner; and/or  . In rare instances, security protocols could fail, causing a breach of personal health information.  Furthermore, I acknowledge  that it is my responsibility to provide information about my medical history, conditions and care that is complete and accurate to the best of my ability. I acknowledge that Practitioner's advice, recommendations, and/or decision may be based on factors not within their control, such as incomplete or inaccurate data provided by me or distortions of diagnostic images or specimens that may result from electronic transmissions. I understand that the practice of medicine is not an exact science and that Practitioner makes no warranties or guarantees regarding treatment outcomes. I acknowledge that I will receive a copy of this consent concurrently upon execution via email to the email address I last provided but may also request a printed copy by calling the office of [facility].    I understand that my insurance will be  billed for this visit.   I have read or had this consent read to me. . I understand the contents of this consent, which adequately explains the benefits and risks of the Services being provided via telemedicine.  . I have been provided ample opportunity to ask questions regarding this consent and the Services and have had my questions answered to my satisfaction. . I give my informed consent for the services to be provided through the use of telemedicine in my medical care  By participating in this telemedicine visit I agree to the above.

## 2018-06-05 ENCOUNTER — Encounter (HOSPITAL_COMMUNITY): Payer: PPO

## 2018-06-09 NOTE — Progress Notes (Signed)
Telehealth Visit     Virtual Visit via Telephone Note   This visit type was conducted due to national recommendations for restrictions regarding the COVID-19 Pandemic (e.g. social distancing) in an effort to limit this patient's exposure and mitigate transmission in our community.  Due to his co-morbid illnesses, this patient is at least at moderate risk for complications without adequate follow up.  This format is felt to be most appropriate for this patient at this time.  The patient did not have access to video technology/had technical difficulties with video requiring transitioning to audio format only (telephone).  All issues noted in this document were discussed and addressed.  No physical exam could be performed with this format.  Please refer to the patient's chart for his  consent to telehealth for [facility].   Evaluation Performed:  Follow-up visit  This visit type was conducted due to national recommendations for restrictions regarding the COVID-19 Pandemic (e.g. social distancing).  This format is felt to be most appropriate for this patient at this time.  All issues noted in this document were discussed and addressed.  No physical exam was performed (except for noted visual exam findings with Video Visits).  Please refer to the patient's chart (MyChart message for video visits and phone note for telephone visits) for the patient's consent to telehealth for [facility]  Date:  06/10/2018   ID:  [name], DOB 11/14/1940, MRN [id number]  Patient Location:  Home  Provider location:   Home  PCP:  Angelina Sheriff, MD  Cardiologist:  Servando Snare Jenkins Rouge, MD  Electrophysiologist:  None   Chief Complaint:  Post cath visit.   History of Present Illness:    Peggy L Andrus is a 78 y.o. male who presents via Engineer, civil (consulting) for a telehealth visit today.  Seen for Dr. Johnsie Cancel.   He has known CAD with remote CABG in 2009. Cath in January of 2019 for chest  pain - grafts patent at that time with small vessel disease - ?microvascular angina. The LIMA to LAD, SVG to OM1/OM2 and SVG to PDA were patent.  Other issues include PAD with prior interventions - followed by Dr. [name], HTN, HLD with ? statin intolerance, pancreatic cancer, lumbar spine disease with chronic pain/neuropathy.   He has been unable to take Imdur in the past. He called last month with increasing use of sl NTG - did not wish to go to the ER. Had been placed back on Creon for his pancrease with tolerability issues.    Myoview was updated earlier this month and with reassuring findings, however his symptoms persisted and patient was referred for cardiac cath - see below - to continue with medical management.   The patient does not have symptoms concerning for COVID-19 infection (fever, chills, cough, or new shortness of breath).   Seen today via telephone - he does not have a smart phone and has declined My Chart activation. He has consented for this telephone visit. I actually used to take care of his brother Elenore Rota.  Yona has had more GI issues since his cath - has had a call in to [facility] for guidance - he is being worked up for recurrent H pylori. He is now off his PPI therapy so he can get tested next week. He continues to have exertional chest pain. He is not taking his Ranexa - he did not realize that he should be taking this - he does have it. He  has been trying to do his walking - previously was able to walk a mile a day but over the past 6 months due to progressive chest pain - has not been able. He is trying to get back to his walking. His cath site from his arm looks. He notes his blood sugar is ok. He has no other real concerns.   Past Medical History:  Diagnosis Date  . CAD (coronary artery disease)    a. 04/2007 s/p CABG x 4  - LIMA->LAD, VG->OM1->Om2, VG->PDA;  b. 12/2008 & 06/2010 Caths - Native multivessel dzs with 4/4 patent grafts. c. cath 03/09/2015 patent grafts,  unchanged from prior cath.  . Cancer [facility])    pancreatic  March 2011  . Carotid bruit    a. 07/2010 U/S- 0-39% bilat ICA stenosis  . Chest pain    Noncardiac probably related to reflux  . DDD (degenerative disc disease)    several surgeries  . GERD (gastroesophageal reflux disease)   . HNP (herniated nucleus pulposus), lumbar    a. L2-3, s/p laminotomy, microdiskectomy 02/2009  . HOH (hard of hearing)    uses amplifiers   . Nephrolithiasis   . PAD (peripheral artery disease) ([place] [facility]) 04/2014   s/p L Common Iliac Stent  . Palpitations   . 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 Pneumonia    hx of  pna  . Pure hypercholesterolemia    statin intolerance  . Type II or unspecified type diabetes mellitus without mention of complication, not stated as uncontrolled    insulin dependent  . Unspecified essential hypertension    Past Surgical History:  Procedure Laterality Date  . ABDOMINAL AORTAGRAM N/A 05/13/2014   Procedure: ABDOMINAL [name];  Surgeon: Wellington Hampshire, MD;  Location: [facility] CATH LAB;  Service: Cardiovascular;  Laterality: N/A;  . ANTERIOR CERVICAL DECOMP/DISCECTOMY FUSION N/A 07/28/2014   Procedure: ANTERIOR CERVICAL DECOMPRESSION/DISCECTOMY FUSION CERVICAL FOUR-FIVE CERVICAL FIVE-SIX ;  Surgeon: Earnie Larsson, MD;  Location: [facility] NEURO ORS;  Service: Neurosurgery;  Laterality: N/A;  . APPENDECTOMY    . arthroscopy right knee    . BACK SURGERY    . CARDIAC CATHETERIZATION N/A 03/09/2015   Procedure: Left Heart Cath and Cors/Grafts Angiography;  Surgeon: [name] Sine, MD;  Location: [facility] CV LAB;  Service: Cardiovascular;  Laterality: N/A;  . CHOLECYSTECTOMY    . CORONARY ARTERY BYPASS GRAFT     X 4 2009 (LIMA to LAD, SVG to first cicumflex marginal branch with sequential SVG to second cicumflex marginal branch  and saphenous vein to posterior descending coronary artery with endoscopic,vein harvest rt. lower exttremity by Dr.Owen  on March 31,2009  . CYST REMOVED     FROM SPINE  . CYSTOURETHROSCOPY     right retrograde pyelogram,manipulate stone in the renal pelvis, rt. double-j catheter.  [name] Kitchen EYE SURGERY    . KNEE SURGERY     rt knee  . LEFT HEART CATH AND CORS/GRAFTS ANGIOGRAPHY N/A 02/20/2017   Procedure: LEFT HEART CATH AND CORS/GRAFTS ANGIOGRAPHY;  Surgeon: Leonie Man, MD;  Location: [facility] CV LAB;  Service: Cardiovascular;  Laterality: N/A;  . LEFT HEART CATH AND CORS/GRAFTS ANGIOGRAPHY N/A 05/28/2018   Procedure: LEFT HEART CATH AND CORS/GRAFTS ANGIOGRAPHY;  Surgeon: Burnell Blanks, MD;  Location: [facility] CV LAB;  Service: Cardiovascular;  Laterality: N/A;  . NEPHROLITHOTOMY     PERCUTANEOUS  . PARS PLANA VITRECTOMY     lt.  eye,retinal photocoagulation lt. eye, membrane peel lt. eye.  [name] Kitchen PERCUTANEOUS STENT INTERVENTION Left 05/13/2014   Procedure: PERCUTANEOUS STENT INTERVENTION;  Surgeon: Wellington Hampshire, MD;  Location: [facility] CATH LAB;  Service: Cardiovascular;  Laterality: Left;  COMMON ILLIAC  . re-exploratin of laminectomy  05/2008   (RT L2-3) WITH REDO MICRODISKECTOMY     Current Meds  Medication Sig  . acetaminophen (TYLENOL 8 HOUR ARTHRITIS PAIN) 650 MG CR tablet Take 650 mg by mouth every 8 (eight) hours as needed for pain (arthritis).  [name] Kitchen acetaminophen (TYLENOL) 325 MG tablet Take 650 mg by mouth every 6 (six) hours as needed (pain).  [name] Kitchen amLODipine (NORVASC) 10 MG tablet Take 10 mg by mouth daily.  [name] Kitchen aspirin EC 81 MG tablet Take 81 mg by mouth at bedtime.  [name] Kitchen atorvastatin (LIPITOR) 20 MG tablet Take 20 mg by mouth daily.   [name] Kitchen gabapentin (NEURONTIN) 100 MG capsule Take 100 mg by mouth 2 (two) times daily.  [name] Kitchen glipiZIDE (GLUCOTROL) 10 MG tablet Take 10 mg by mouth 2 (two) times daily.   . insulin glargine (LANTUS SOLOSTAR) 100 UNIT/ML injection Inject 15 Units into the skin at bedtime.   [name] Kitchen lisinopril-hydrochlorothiazide (PRINZIDE,ZESTORETIC) 20-12.5 MG per tablet Take 1 tablet by mouth  daily.    . Multiple Vitamin (MULTIVITAMIN WITH MINERALS) TABS tablet Take 1 tablet by mouth daily.  . nitroGLYCERIN (NITROSTAT) 0.4 MG SL tablet Place 1 tablet (0.4 mg total) under the tongue every 5 (five) minutes as needed for chest pain.  . tamsulosin (FLOMAX) 0.4 MG CAPS capsule Take 0.4 mg by mouth daily after supper.  . [DISCONTINUED] diphenhydrAMINE (BENADRYL) 25 MG tablet Take 25 mg by mouth every 6 (six) hours as needed for allergies. Two tablets at once morning of LHC     Allergies:   Rosuvastatin calcium; Iohexol; and Statins   Social History   Tobacco Use  . Smoking status: Never Smoker  . Smokeless tobacco: Never Used  Substance Use Topics  . Alcohol use: No  . Drug use: No     Family Hx: The patient's family history includes Coronary artery disease in his brother and sister; Diabetes in his brother; Heart attack in his father; Other in his mother and unknown relative.  ROS:   Please see the history of present illness.   All other systems reviewed are negative except for GI complaints.    Objective:    Vital Signs:  BP (!) 139/56 Comment: sun 136/53  sat 130/54  Pulse (!) 57   Ht 5' 5.5" (1.664 m)   Wt 156 lb (70.8 kg)   BMI 25.56 kg/m    Wt Readings from Last 3 Encounters:  06/10/18 156 lb (70.8 kg)  05/16/18 160 lb (72.6 kg)  05/02/18 160 lb (72.6 kg)    Alert male. He is in no acute distress. Does not sound short of breath with conversation. Appropriate in responses.    Labs/Other Tests and Data Reviewed:    Lab Results  Component Value Date   WBC 9.7 05/28/2018   HGB 16.2 05/28/2018   HCT 48.2 05/28/2018   PLT 295 05/28/2018   GLUCOSE 349 (H) 05/28/2018   CHOL 138 02/21/2017   TRIG 44 02/21/2017   HDL 43 02/21/2017   LDLCALC 86 02/21/2017   ALT 66 (H) 01/19/2018   AST 47 (H) 01/19/2018   NA 134 (L) 05/28/2018   K 4.5 05/28/2018   CL 94 (L) 05/28/2018   CREATININE 1.20 05/28/2018   BUN 28 (H)  05/28/2018   CO2 22 05/28/2018   INR 1.03  02/20/2017   HGBA1C 6.5 (H) 02/21/2017        BNP (last 3 results) No results for input(s): BNP in the last 8760 hours.  ProBNP (last 3 results) No results for input(s): PROBNP in the last 8760 hours.    Prior CV studies:    The following studies were reviewed today:  LEFT HEART CATH AND CORS/GRAFTS ANGIOGRAPHY 05/28/2018  Conclusion     Ost 1st Diag lesion is 70% stenosed.  Ost 2nd Diag to 2nd Diag lesion is 70% stenosed.  Ost LAD to Prox LAD lesion is 80% stenosed.  Prox LAD to Mid LAD lesion is 85% stenosed.  Prox LAD lesion is 100% stenosed.  Ost 3rd Mrg lesion is 100% stenosed.  Ost Cx to Dist Cx lesion is 80% stenosed.  Dist Cx lesion is 100% stenosed.  Prox RCA to Dist RCA lesion is 100% stenosed.  SVG and is large.  Seq SVG- OM3-OM4 and is large.  The graft exhibits no disease.  Prox Graft lesion before Ost 3rd Mrg is 25% stenosed.  The left ventricular systolic function is normal.  LV end diastolic pressure is normal.  The left ventricular ejection fraction is 55-65% by visual estimate.  There is no mitral valve regurgitation.   1. Severe triple vessel CAD 2. Chronic occlusion proximal LAD. The mid and distal LAD fills from the patent LIMA graft to the mid LAD.  3. Chronic occlusion proximal Circumflex. The first and second obtuse marginal branches fill from the patent sequential vein graft 4. Chronic occlusion of the ostial RCA. The distal branches fill from the patent vein graft.  5. Normal LV systolic function.  6. The target vessels are diffusely diseased and unchanged grossly from last cath in 2019. No targets for PCI.   Recommendations: Continue medical management of CAD. Consider titration of anti-anginal therapy.     Myoview Study Highlights 05/16/2018     The left ventricular ejection fraction is hyperdynamic (>65%).  Nuclear stress EF: 78%.  There was no ST segment deviation noted during stress. The study is normal.Normal  pharmacologic nuclear stress test with no evidence for a prior infarct or ischemia       ASSESSMENT & PLAN:    1.  Chest pain with recent cardiac cath with findings as above - to continue with medical management due to diffuse disease of the target vessels. He was started on Ranexa prior to his cath - he has not been taking - we will restart this today. Hopefully his angina will improve. Will recheck him in a month. Ok to walk as tolerated - he has NTG on hand as well.   2. Remote CABG - see #1.   3. HLD - statin intolerance indicated but he is taking Lipitor at this time and seems to be able to tolerate   4. Pancreatic cancer - followed at [facility] - has had recent EGD and negative CT - thought to have T1N1 neuroendocrine carcinoma s/p resection of tail of pancrease and splenectomy.   5. PAD - prior left iliac artery stenting - has known bilateral SFA occlusion with mild claudication - followed by Dr. [name] - to see him back in August per recall.   6. Bilateral carotid disease - mild per duplex study from 02/2017  7. Chronic GI issues - currently being worked up for [employer]  8. COVID-19 Education: The signs and symptoms of COVID-19 were discussed with the patient and  how to seek care for testing (follow up with PCP or arrange E-visit).  The importance of social distancing, staying at home and hand hygiene were discussed today.  Patient Risk:   After full review of this patient's clinical status, I feel that they are at least moderate risk at this time.  Time:   Today, I have spent 10 minutes with the patient with telehealth technology discussing the above issues.     Medication Adjustments/Labs and Tests Ordered: Current medicines are reviewed at length with the patient today.  Concerns regarding medicines are outlined above.   Tests Ordered: No orders of the defined types were placed in this encounter.   Medication Changes: No orders of the defined types were placed in this  encounter.   Disposition:  FU with Dr. Johnsie Cancel for virtual visit in one month. See Dr. [name] in August as planned per recall.   Patient is agreeable to this plan and will call if any problems develop in the interim.   Amie Critchley, NP  06/10/2018 8:15 AM    [place]

## 2018-06-10 ENCOUNTER — Other Ambulatory Visit: Payer: Self-pay

## 2018-06-10 ENCOUNTER — Telehealth (INDEPENDENT_AMBULATORY_CARE_PROVIDER_SITE_OTHER): Payer: PPO | Admitting: Nurse Practitioner

## 2018-06-10 ENCOUNTER — Encounter: Payer: Self-pay | Admitting: Nurse Practitioner

## 2018-06-10 ENCOUNTER — Telehealth: Payer: Self-pay

## 2018-06-10 VITALS — BP 139/56 | HR 57 | Ht 65.5 in | Wt 156.0 lb

## 2018-06-10 DIAGNOSIS — Z9889 Other specified postprocedural states: Secondary | ICD-10-CM

## 2018-06-10 DIAGNOSIS — Z7189 Other specified counseling: Secondary | ICD-10-CM

## 2018-06-10 DIAGNOSIS — I10 Essential (primary) hypertension: Secondary | ICD-10-CM

## 2018-06-10 DIAGNOSIS — I739 Peripheral vascular disease, unspecified: Secondary | ICD-10-CM

## 2018-06-10 DIAGNOSIS — I2510 Atherosclerotic heart disease of native coronary artery without angina pectoris: Secondary | ICD-10-CM

## 2018-06-10 DIAGNOSIS — E782 Mixed hyperlipidemia: Secondary | ICD-10-CM

## 2018-06-10 MED ORDER — RANOLAZINE ER 500 MG PO TB12: 500.0000 mg | ORAL_TABLET | Freq: Two times a day (BID) | ORAL | Status: DC

## 2018-06-10 NOTE — Patient Instructions (Addendum)
After Visit Summary:  We will be checking the following labs today - None   Medication Instructions:    Continue with your current medicines.   Please restart the Ranexa 500 mg twice a day    If you need a refill on your cardiac medications before your next appointment, please call your pharmacy.     Testing/Procedures To Be Arranged:  N/A  Follow-Up:   Virtual visit with Dr. Johnsie Cancel in one month.   See Dr. [name] in August - per recall   At [facility], you and your health needs are our priority.  As part of our continuing mission to provide you with exceptional heart care, we have created designated Provider Care Teams.  These Care Teams include your primary Cardiologist (physician) and Advanced Practice Providers (APPs -  Physician Assistants and Nurse Practitioners) who all work together to provide you with the care you need, when you need it.  Special Instructions:  . Stay safe, stay home and wash your hands for at least 20 seconds! . It was good to talk with you today . Ok to resume your walking as you feel - getting on the Ranexa hopefully will help with this.   Call the [place] office at [phone] if you have any questions, problems or concerns.

## 2018-06-10 NOTE — Telephone Encounter (Signed)
Called patient and made him an appointment at the end of May.

## 2018-06-10 NOTE — Telephone Encounter (Signed)
-----   Message from Burtis Junes, NP sent at 06/10/2018  8:29 AM EDT ----- Needs virtual visit with Dr. Johnsie Cancel in one month to follow up starting Ranexa.   Thanks  lori

## 2018-06-13 ENCOUNTER — Encounter (INDEPENDENT_AMBULATORY_CARE_PROVIDER_SITE_OTHER): Payer: PPO | Admitting: Ophthalmology

## 2018-06-13 ENCOUNTER — Other Ambulatory Visit: Payer: Self-pay

## 2018-06-13 DIAGNOSIS — E11319 Type 2 diabetes mellitus with unspecified diabetic retinopathy without macular edema: Secondary | ICD-10-CM | POA: Diagnosis not present

## 2018-06-13 DIAGNOSIS — I10 Essential (primary) hypertension: Secondary | ICD-10-CM | POA: Diagnosis not present

## 2018-06-13 DIAGNOSIS — H35033 Hypertensive retinopathy, bilateral: Secondary | ICD-10-CM | POA: Diagnosis not present

## 2018-06-13 DIAGNOSIS — E113593 Type 2 diabetes mellitus with proliferative diabetic retinopathy without macular edema, bilateral: Secondary | ICD-10-CM | POA: Diagnosis not present

## 2018-06-17 DIAGNOSIS — R1013 Epigastric pain: Secondary | ICD-10-CM | POA: Diagnosis not present

## 2018-06-27 NOTE — Progress Notes (Signed)
Virtual Visit via Telephone Note   This visit type was conducted due to national recommendations for restrictions regarding the COVID-19 Pandemic (e.g. social distancing) in an effort to limit this patient's exposure and mitigate transmission in our community.  Due to his co-morbid illnesses, this patient is at least at moderate risk for complications without adequate follow up.  This format is felt to be most appropriate for this patient at this time.  The patient did not have access to video technology/had technical difficulties with video requiring transitioning to audio format only (telephone).  All issues noted in this document were discussed and addressed.  No physical exam could be performed with this format.  Please refer to the patient's chart for his  consent to telehealth for Ricardo Le.   Date:  07/04/2018   ID:  Ricardo Le, DOB 04-09-1940, MRN [id number]  Patient Location: Home Provider Location: Office  PCP:  Angelina Sheriff, MD  Cardiologist:  Jenkins Rouge, MD   Electrophysiologist:  None   Evaluation Performed:  Follow-Up Visit  Chief Complaint:  CAD/ Angina  History of Present Illness:    78 y.o. with CABG 2009, PAD followed by Dr [name], HTN, HLD on statin  and pancreatic cancer. Activity limited by lumbar spine disease He has been intolerant to imdur in past  He has been having chronic chest pain Myovue 05/16/18 was non ischemic with EF 78% Persistent pain and cath done 05/28/18 Patient grafts and diffuse disease distal to grafts not amenable to intervention No change from cath 02/20/17 Medical Rx recommended Seen by PA 06/10/18 and restarted on Ranexa Complains of dyspnea for 2 months Sleeping in chair Denies cough sputum or fever. Does not indicate edema Compliant with meds some abdominal discomfort no nausea Compliant with meds Weight is stable No chest pain    The patient does not have symptoms concerning for COVID-19 infection (fever, chills, cough, or new  shortness of breath).    Past Medical History:  Diagnosis Date  . CAD (coronary artery disease)    a. 04/2007 s/p CABG x 4  - LIMA->LAD, VG->OM1->Om2, VG->PDA;  b. 12/2008 & 06/2010 Caths - Native multivessel dzs with 4/4 patent grafts. c. cath 03/09/2015 patent grafts, unchanged from prior cath.  . Cancer [facility])    pancreatic  March 2011  . Carotid bruit    a. 07/2010 U/S- 0-39% bilat ICA stenosis  . Chest pain    Noncardiac probably related to reflux  . DDD (degenerative disc disease)    several surgeries  . GERD (gastroesophageal reflux disease)   . HNP (herniated nucleus pulposus), lumbar    a. L2-3, s/p laminotomy, microdiskectomy 02/2009  . HOH (hard of hearing)    uses amplifiers   . Nephrolithiasis   . PAD (peripheral artery disease) ([place]) 04/2014   s/p L Common Iliac Stent  . Palpitations   . 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 Pneumonia    hx of  pna  . Pure hypercholesterolemia    statin intolerance  . Type II or unspecified type diabetes mellitus without mention of complication, not stated as uncontrolled    insulin dependent  . Unspecified essential hypertension    Past Surgical History:  Procedure Laterality Date  . ABDOMINAL AORTAGRAM N/A 05/13/2014   Procedure: ABDOMINAL [name];  Surgeon: Wellington Hampshire, MD;  Location: [place] CATH LAB;  Service: Cardiovascular;  Laterality: N/A;  . ANTERIOR CERVICAL DECOMP/DISCECTOMY FUSION  N/A 07/28/2014   Procedure: ANTERIOR CERVICAL DECOMPRESSION/DISCECTOMY FUSION CERVICAL FOUR-FIVE CERVICAL FIVE-SIX ;  Surgeon: Earnie Larsson, MD;  Location: [place] NEURO ORS;  Service: Neurosurgery;  Laterality: N/A;  . APPENDECTOMY    . arthroscopy right knee    . BACK SURGERY    . CARDIAC CATHETERIZATION N/A 03/09/2015   Procedure: Left Heart Cath and Cors/Grafts Angiography;  Surgeon: [name] Sine, MD;  Location: [place] CV LAB;  Service: Cardiovascular;  Laterality: N/A;  . CHOLECYSTECTOMY    .  CORONARY ARTERY BYPASS GRAFT     X 4 2009 (LIMA to LAD, SVG to first cicumflex marginal branch with sequential SVG to second cicumflex marginal branch  and saphenous vein to posterior descending coronary artery with endoscopic,vein harvest rt. lower exttremity by Dr.Owen on March 31,2009  . CYST REMOVED     FROM SPINE  . CYSTOURETHROSCOPY     right retrograde pyelogram,manipulate stone in the renal pelvis, rt. double-j catheter.  [name] Kitchen EYE SURGERY    . KNEE SURGERY     rt knee  . LEFT HEART CATH AND CORS/GRAFTS ANGIOGRAPHY N/A 02/20/2017   Procedure: LEFT HEART CATH AND CORS/GRAFTS ANGIOGRAPHY;  Surgeon: Leonie Man, MD;  Location: [place] CV LAB;  Service: Cardiovascular;  Laterality: N/A;  . LEFT HEART CATH AND CORS/GRAFTS ANGIOGRAPHY N/A 05/28/2018   Procedure: LEFT HEART CATH AND CORS/GRAFTS ANGIOGRAPHY;  Surgeon: Burnell Blanks, MD;  Location: [place] CV LAB;  Service: Cardiovascular;  Laterality: N/A;  . NEPHROLITHOTOMY     PERCUTANEOUS  . PARS PLANA VITRECTOMY     lt. eye,retinal photocoagulation lt. eye, membrane peel lt. eye.  [name] Kitchen PERCUTANEOUS STENT INTERVENTION Left 05/13/2014   Procedure: PERCUTANEOUS STENT INTERVENTION;  Surgeon: Wellington Hampshire, MD;  Location: [place] CATH LAB;  Service: Cardiovascular;  Laterality: Left;  COMMON ILLIAC  . re-exploratin of laminectomy  05/2008   (RT L2-3) WITH REDO MICRODISKECTOMY     Current Meds  Medication Sig  . acetaminophen (TYLENOL 8 HOUR ARTHRITIS PAIN) 650 MG CR tablet Take 650 mg by mouth every 8 (eight) hours as needed for pain (arthritis).  [name] Kitchen acetaminophen (TYLENOL) 325 MG tablet Take 650 mg by mouth every 6 (six) hours as needed (pain).  [name] Kitchen amLODipine (NORVASC) 10 MG tablet Take 10 mg by mouth daily.  [name] Kitchen aspirin EC 81 MG tablet Take 81 mg by mouth at bedtime.  [name] Kitchen atorvastatin (LIPITOR) 20 MG tablet Take 20 mg by mouth daily.   [name] Kitchen esomeprazole (NEXIUM) 40 MG capsule Take 40 mg by mouth daily.  [name] Kitchen gabapentin (NEURONTIN) 100 MG  capsule Take 100 mg by mouth 2 (two) times daily.  [name] Kitchen glipiZIDE (GLUCOTROL) 10 MG tablet Take 10 mg by mouth 2 (two) times daily.   . insulin glargine (LANTUS SOLOSTAR) 100 UNIT/ML injection Inject 15 Units into the skin at bedtime.   [name] Kitchen lisinopril-hydrochlorothiazide (PRINZIDE,ZESTORETIC) 20-12.5 MG per tablet Take 1 tablet by mouth daily.    . Multiple Vitamin (MULTIVITAMIN WITH MINERALS) TABS tablet Take 1 tablet by mouth daily.  . nitroGLYCERIN (NITROSTAT) 0.4 MG SL tablet Place 1 tablet (0.4 mg total) under the tongue every 5 (five) minutes as needed for chest pain.  . ranolazine (RANEXA) 500 MG 12 hr tablet Take 1 tablet (500 mg total) by mouth 2 (two) times daily.  . tamsulosin (FLOMAX) 0.4 MG CAPS capsule Take 0.4 mg by mouth daily after supper.     Allergies:   Rosuvastatin calcium; Iohexol; and Statins   Social History  Tobacco Use  . Smoking status: Never Smoker  . Smokeless tobacco: Never Used  Substance Use Topics  . Alcohol use: No  . Drug use: No     Family Hx: The patient's family history includes Coronary artery disease in his brother and sister; Diabetes in his brother; Heart attack in his father; Other in his mother and unknown relative.  ROS:   Please see the history of present illness.     All other systems reviewed and are negative.   Prior CV studies:    Cath 05/28/18  1. Severe triple vessel CAD 2. Chronic occlusion proximal LAD. The mid and distal LAD fills from the patent LIMA graft to the mid LAD.  3. Chronic occlusion proximal Circumflex. The first and second obtuse marginal branches fill from the patent sequential vein graft 4. Chronic occlusion of the ostial RCA. The distal branches fill from the patent vein graft.  5. Normal LV systolic function.  6. The target vessels are diffusely diseased and unchanged grossly from last cath in 2019. No targets for PCI.   Recommendations: Continue medical management of CAD. Consider titration of anti-anginal  therapy.   Labs/Other Tests and Data Reviewed:    EKG:  SR RBBB LAD poor R wave progression   Recent Labs: 01/19/2018: ALT 66 05/28/2018: BUN 28; Creatinine, Ser 1.20; Hemoglobin 16.2; Platelets 295; Potassium 4.5; Sodium 134   Recent Lipid Panel Lab Results  Component Value Date/Time   CHOL 138 02/21/2017 02:22 AM   TRIG 44 02/21/2017 02:22 AM   HDL 43 02/21/2017 02:22 AM   CHOLHDL 3.2 02/21/2017 02:22 AM   LDLCALC 86 02/21/2017 02:22 AM    Wt Readings from Last 3 Encounters:  07/04/18 70.3 kg  06/10/18 70.8 kg  05/16/18 72.6 kg     Objective:    Vital Signs:  BP (!) 136/53   Pulse (!) 57   Ht 5\' 5"  (1.651 m)   Wt 70.3 kg   BMI 25.79 kg/m    No exam telephone interview  ASSESSMENT & PLAN:    1. CAD:  CABG 2009 patent grafts at cath 05/28/18 Diffuse small vessel disease distal to graft insertion not amenable to PCI/Stent. Myovue 05/16/18 non ischemic despite recurrent pains On Ranexa and and amlodipine Intolerant to imdur  2. DM:  Discussed low carb diet.  Target hemoglobin A1c is 6.5 or less.  Continue current medications. 3. HLD continue low dose statin myalgias at higher dose 4. HTN:  Well controlled.  Continue current medications and low sodium Dash type diet.   5. PVD:  Left CIA stent 2016 with known bilateral SFA occlusions and one vessel run off ABI 0.70 on right and 0.84 on left F/U with Dr [name] October with repeat testing  6. Dyspnea:  Describing new onset CHF. Will order BNP/BMET and Hct with CXR today See DOD tomorrow in office to examine and further w/u   COVID-19 Education: The signs and symptoms of COVID-19 were discussed with the patient and how to seek care for testing (follow up with PCP or arrange E-visit).  The importance of social distancing was discussed today.  Time:   Today, I have spent 30 minutes with the patient with telehealth technology discussing the above problems.     Medication Adjustments/Labs and Tests Ordered: Current medicines are  reviewed at length with the patient today.  Concerns regarding medicines are outlined above.   Tests Ordered: No orders of the defined types were placed in this encounter.   Medication  Changes: No orders of the defined types were placed in this encounter.   Disposition:  Follow up DOM tomoorow Arida in October and me in 3 months   Signed, Jenkins Rouge, MD  07/04/2018 8:52 AM    [place]

## 2018-07-04 ENCOUNTER — Other Ambulatory Visit: Payer: Self-pay

## 2018-07-04 ENCOUNTER — Ambulatory Visit
Admission: RE | Admit: 2018-07-04 | Discharge: 2018-07-04 | Disposition: A | Discharge status: Home / Self Care | Payer: PPO | Source: Ambulatory Visit | Attending: Cardiovascular Disease | Admitting: Cardiovascular Disease

## 2018-07-04 ENCOUNTER — Encounter: Payer: Self-pay | Admitting: Cardiology

## 2018-07-04 ENCOUNTER — Encounter: Payer: Self-pay | Admitting: Cardiovascular Disease

## 2018-07-04 ENCOUNTER — Other Ambulatory Visit: Payer: PPO | Admitting: *Deleted

## 2018-07-04 ENCOUNTER — Telehealth (INDEPENDENT_AMBULATORY_CARE_PROVIDER_SITE_OTHER): Payer: PPO | Admitting: Cardiovascular Disease

## 2018-07-04 ENCOUNTER — Ambulatory Visit (INDEPENDENT_AMBULATORY_CARE_PROVIDER_SITE_OTHER): Payer: PPO | Admitting: Cardiology

## 2018-07-04 VITALS — BP 138/60 | HR 61 | Ht 65.0 in | Wt 160.8 lb

## 2018-07-04 VITALS — BP 136/53 | HR 57 | Ht 65.0 in | Wt 155.0 lb

## 2018-07-04 DIAGNOSIS — R0602 Shortness of breath: Secondary | ICD-10-CM | POA: Diagnosis not present

## 2018-07-04 DIAGNOSIS — Z951 Presence of aortocoronary bypass graft: Secondary | ICD-10-CM

## 2018-07-04 DIAGNOSIS — R0600 Dyspnea, unspecified: Secondary | ICD-10-CM

## 2018-07-04 DIAGNOSIS — R0609 Other forms of dyspnea: Secondary | ICD-10-CM | POA: Diagnosis not present

## 2018-07-04 NOTE — Progress Notes (Signed)
07/04/2018 [place]   [birth year]  [id number]  Primary Physician Angelina Sheriff, MD Primary Cardiologist: Jenkins Rouge, MD  Electrophysiologist: None   Reason for Visit/CC: Dyspnea   HPI:  Ricardo Le is a 78 y.o. male who is being seen today for the evaluation of dyspnea and suspected new onset CHF, at the request of Dr. Johnsie Cancel.   Pt had telehealth visit (telephone) with Dr. Johnsie Cancel earlier this morning and endorsed new dyspnea concerning for CHF. Dr. Johnsie Cancel has ordered a CXR, BNP/ BMP and CBC and recommend office visit for exam and further evaluation.   Pt also has a h/o CAD s/p CABG in 2009. He also has PAD followed by Dr [name], HTN, HLD on statin  and pancreatic cancer. Activity limited by lumbar spine disease. He has been intolerant to imdur in past  He has been having chronic chest pain. Myovue 05/16/18 was non ischemic with EF 78%. Despite Myoview, he has had persistent pain and was referred for definative cath which was done 05/28/18. Patient's grafts and diffuse disease distal to grafts not amenable to intervention. No change from prior cath 02/20/17. Medical Rx recommended. He had f/u with Anola Gurney 06/10/18 and was restarted on Ranexa.  He had a telephone visit with Dr. Johnsie Cancel today. He complained of dyspnea for 2 months. Endorses orthopnea and PND. Can't lay flat. Has been sleeping in a chair. He denies denies cough, sputum or fever. He denied LEE. Has had some abdominal discomfort but no nausea. Weight has been stable. No chest pain. Reported compliance with meds. Pt added on to my schedule.   In clinic, he does not appear grossly volume overloaded.  There is no peripheral edema.  No JVD.  He endorses subjective abdominal distention and discomfort.  He does have faint bibasilar crackles on exam right greater than left and expiratory rhonchi bilaterally.  No wheezing.  He completed his chest x-ray just prior to the appointment.  Radiologist report reads the following:  The heart size and mediastinal contours are within normal limits.  Prior CABG.  Normal pulmonary vascularity.  Unchanged elevation of the right hemidiaphragm.  Persistent low lung volumes.  No focal consolidation, pleural effusion or pneumothorax.  No acute osseous abnormality.  Air within the colon hepatic flexure underneath the right hemidiaphragm   Overall impression: No active cardiopulmonary disease.  His blood pressure in clinic today is 138/60.  Heart rate 61 bpm.  EKG shows normal sinus rhythm with left axis deviation.  Oxygen saturations 96% on room air.  His temperature was checked during screening, prior to entering our clinic and he was afebrile.  Meds reviewed.  Patient is on low-dose combination lisinopril hydrochlorothiazide.   Patient is retired.  Used to work as a Theme park manager.  We discussed environmental exposures.  Roughly 10 years ago he worked on a old house.  Was unsure of any exposure to asbestos.  We are still awaiting results of laboratory work to return.  Cardiac Studies   Cath 05/28/18  1. Severe triple vessel CAD 2. Chronic occlusion proximal LAD. The mid and distal LAD fills from the patent LIMA graft to the mid LAD.  3. Chronic occlusion proximal Circumflex. The first and second obtuse marginal branches fill from the patent sequential vein graft 4. Chronic occlusion of the ostial RCA. The distal branches fill from the patent vein graft.  5. Normal LV systolic function.  6. The target vessels are diffusely diseased and unchanged grossly from last cath in 2019.  No targets for PCI.   Recommendations: Continue medical management of CAD. Consider titration of anti-anginal therapy.   Current Meds  Medication Sig  . acetaminophen (TYLENOL 8 HOUR ARTHRITIS PAIN) 650 MG CR tablet Take 650 mg by mouth every 8 (eight) hours as needed for pain (arthritis).  [name] Kitchen acetaminophen (TYLENOL) 325 MG tablet Take 650 mg by mouth every 6 (six) hours as needed (pain).  [name] Kitchen amLODipine (NORVASC) 10  MG tablet Take 10 mg by mouth daily.  [name] Kitchen aspirin EC 81 MG tablet Take 81 mg by mouth at bedtime.  [name] Kitchen atorvastatin (LIPITOR) 20 MG tablet Take 20 mg by mouth daily.   [name] Kitchen esomeprazole (NEXIUM) 40 MG capsule Take 40 mg by mouth daily.  [name] Kitchen gabapentin (NEURONTIN) 100 MG capsule Take 100 mg by mouth 2 (two) times daily.  [name] Kitchen glipiZIDE (GLUCOTROL) 10 MG tablet Take 10 mg by mouth 2 (two) times daily.   . insulin glargine (LANTUS SOLOSTAR) 100 UNIT/ML injection Inject 15 Units into the skin at bedtime.   [name] Kitchen lisinopril-hydrochlorothiazide (PRINZIDE,ZESTORETIC) 20-12.5 MG per tablet Take 1 tablet by mouth daily.    . Multiple Vitamin (MULTIVITAMIN WITH MINERALS) TABS tablet Take 1 tablet by mouth daily.  . nitroGLYCERIN (NITROSTAT) 0.4 MG SL tablet Place 1 tablet (0.4 mg total) under the tongue every 5 (five) minutes as needed for chest pain.  . ranolazine (RANEXA) 500 MG 12 hr tablet Take 1 tablet (500 mg total) by mouth 2 (two) times daily.  . tamsulosin (FLOMAX) 0.4 MG CAPS capsule Take 0.4 mg by mouth daily after supper.   Allergies  Allergen Reactions  . Rosuvastatin Calcium Other (See Comments)    Myalgias  . Iohexol Hives    pt ok with premedication (Benadryl 50mg  PO), Onset Date: [id number]   . Statins Other (See Comments)    Myalgias    Past Medical History:  Diagnosis Date  . CAD (coronary artery disease)    a. 04/2007 s/p CABG x 4  - LIMA->LAD, VG->OM1->Om2, VG->PDA;  b. 12/2008 & 06/2010 Caths - Native multivessel dzs with 4/4 patent grafts. c. cath 03/09/2015 patent grafts, unchanged from prior cath.  . Cancer [facility])    pancreatic  March 2011  . Carotid bruit    a. 07/2010 U/S- 0-39% bilat ICA stenosis  . Chest pain    Noncardiac probably related to reflux  . DDD (degenerative disc disease)    several surgeries  . GERD (gastroesophageal reflux disease)   . HNP (herniated nucleus pulposus), lumbar    a. L2-3, s/p laminotomy, microdiskectomy 02/2009  . HOH (hard of hearing)    uses  amplifiers   . Nephrolithiasis   . PAD (peripheral artery disease) ([place]) 04/2014   s/p L Common Iliac Stent  . Palpitations   . 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 Pneumonia    hx of  pna  . Pure hypercholesterolemia    statin intolerance  . Type II or unspecified type diabetes mellitus without mention of complication, not stated as uncontrolled    insulin dependent  . Unspecified essential hypertension    Family History  Problem Relation Age of Onset  . Heart attack Father        died of MI @ 65  . Diabetes Brother        type 2  . Other Mother        died @ 20  . Coronary artery disease Sister  alive  . Other Unknown        no fh of colon cancer  . Coronary artery disease Brother        s/p CABG.  Alive @ 64   Past Surgical History:  Procedure Laterality Date  . ABDOMINAL AORTAGRAM N/A 05/13/2014   Procedure: ABDOMINAL [name];  Surgeon: Wellington Hampshire, MD;  Location: [place] CATH LAB;  Service: Cardiovascular;  Laterality: N/A;  . ANTERIOR CERVICAL DECOMP/DISCECTOMY FUSION N/A 07/28/2014   Procedure: ANTERIOR CERVICAL DECOMPRESSION/DISCECTOMY FUSION CERVICAL FOUR-FIVE CERVICAL FIVE-SIX ;  Surgeon: Earnie Larsson, MD;  Location: [place] NEURO ORS;  Service: Neurosurgery;  Laterality: N/A;  . APPENDECTOMY    . arthroscopy right knee    . BACK SURGERY    . CARDIAC CATHETERIZATION N/A 03/09/2015   Procedure: Left Heart Cath and Cors/Grafts Angiography;  Surgeon: [name] Sine, MD;  Location: [place] CV LAB;  Service: Cardiovascular;  Laterality: N/A;  . CHOLECYSTECTOMY    . CORONARY ARTERY BYPASS GRAFT     X 4 2009 (LIMA to LAD, SVG to first cicumflex marginal branch with sequential SVG to second cicumflex marginal branch  and saphenous vein to posterior descending coronary artery with endoscopic,vein harvest rt. lower exttremity by Dr.Owen on March 31,2009  . CYST REMOVED     FROM SPINE  . CYSTOURETHROSCOPY     right  retrograde pyelogram,manipulate stone in the renal pelvis, rt. double-j catheter.  [name] Kitchen EYE SURGERY    . KNEE SURGERY     rt knee  . LEFT HEART CATH AND CORS/GRAFTS ANGIOGRAPHY N/A 02/20/2017   Procedure: LEFT HEART CATH AND CORS/GRAFTS ANGIOGRAPHY;  Surgeon: Leonie Man, MD;  Location: [place] CV LAB;  Service: Cardiovascular;  Laterality: N/A;  . LEFT HEART CATH AND CORS/GRAFTS ANGIOGRAPHY N/A 05/28/2018   Procedure: LEFT HEART CATH AND CORS/GRAFTS ANGIOGRAPHY;  Surgeon: Burnell Blanks, MD;  Location: [place] CV LAB;  Service: Cardiovascular;  Laterality: N/A;  . NEPHROLITHOTOMY     PERCUTANEOUS  . PARS PLANA VITRECTOMY     lt. eye,retinal photocoagulation lt. eye, membrane peel lt. eye.  [name] Kitchen PERCUTANEOUS STENT INTERVENTION Left 05/13/2014   Procedure: PERCUTANEOUS STENT INTERVENTION;  Surgeon: Wellington Hampshire, MD;  Location: [place] CATH LAB;  Service: Cardiovascular;  Laterality: Left;  COMMON ILLIAC  . re-exploratin of laminectomy  05/2008   (RT L2-3) WITH REDO MICRODISKECTOMY   Social History   Socioeconomic History  . Marital status: Married    Spouse name: Not on file  . Number of children: Not on file  . Years of education: Not on file  . Highest education level: Not on file  Occupational History  . Not on file  Social Needs  . Financial resource strain: Not on file  . Food insecurity:    Worry: Not on file    Inability: Not on file  . Transportation needs:    Medical: Not on file    Non-medical: Not on file  Tobacco Use  . Smoking status: Never Smoker  . Smokeless tobacco: Never Used  Substance and Sexual Activity  . Alcohol use: No  . Drug use: No  . Sexual activity: Not Currently  Lifestyle  . Physical activity:    Days per week: Not on file    Minutes per session: Not on file  . Stress: Not on file  Relationships  . Social connections:    Talks on phone: Not on file    Gets together: Not on file    Attends  religious service: Not on file     Active member of club or organization: Not on file    Attends meetings of clubs or organizations: Not on file    Relationship status: Not on file  . Intimate partner violence:    Fear of current or ex partner: Not on file    Emotionally abused: Not on file    Physically abused: Not on file    Forced sexual activity: Not on file  Other Topics Concern  . Not on file  Social History Narrative   Lives in [place] with wife.  Retired Company secretary.  Walks 1.29mi daily.  Does not work 2/2 back problems.     Lipid Panel     Component Value Date/Time   CHOL 138 02/21/2017 0222   TRIG 44 02/21/2017 0222   HDL 43 02/21/2017 0222   CHOLHDL 3.2 02/21/2017 0222   VLDL 9 02/21/2017 0222   LDLCALC 86 02/21/2017 0222    Review of Systems: General: negative for chills, fever, night sweats or weight changes.  Cardiovascular: negative for chest pain, dyspnea on exertion, edema, orthopnea, palpitations, paroxysmal nocturnal dyspnea or shortness of breath Dermatological: negative for rash Respiratory: negative for cough or wheezing Urologic: negative for hematuria Abdominal: negative for nausea, vomiting, diarrhea, bright red blood per rectum, melena, or hematemesis Neurologic: negative for visual changes, syncope, or dizziness All other systems reviewed and are otherwise negative except as noted above.   Physical Exam:  Blood pressure 138/60, pulse 61, height 5\' 5"  (1.651 m), weight 160 lb 12.8 oz (72.9 kg).  General appearance: alert, cooperative and no distress Neck: no carotid bruit and no JVD Lungs: Faint bibasilar crackles, right greater than left with some expiratory rhonchi.  No wheezing Heart: normal apical impulse Abdomen: soft, non-tender; bowel sounds normal; no masses,  no organomegaly Extremities: extremities normal, atraumatic, no cyanosis or edema Pulses: 2+ and symmetric Skin: Skin color, texture, turgor normal. No rashes or lesions Neurologic: Grossly normal  EKG normal sinus  rhythm.  61 bpm.  Left axis deviation.-- personally reviewed   ASSESSMENT AND PLAN:   1.  Dyspnea: Symptoms concerning for early CHF.  Although his chest x-ray showed no active cardiopulmonary disease he does endorse exertional dyspnea, orthopnea, PND and abdominal fullness.  No lower extremity edema present on exam and no JVD, however he is noted to have faint bibasilar crackles on examination, right greater than left with expiratory rhonchi.  We are still awaiting the return of laboratory results including a BNP, BMP and CBC.  If BNP is elevated, and if stable renal function and electrolytes, we will plan to call in Rx for furosemide. If diuretics are indicated and CHF diagnosis made, will plan f/u in 7 days w/ repeat labs and will consider echocardiogram to assess LVEF. Given chest x-ray report, if his BNP is normal, we will need to examine noncardiac causes of dyspnea and will refer to pulmonology.  Currently low suspicion for PE.    Follow-Up TBD based on labs. If CHF diagnosis made, f/u in 7-10 days.    Ladoris Gene, MHS [facility] 07/04/2018 4:40 PM

## 2018-07-04 NOTE — Patient Instructions (Signed)
Medication Instructions:  none If you need a refill on your cardiac medications before your next appointment, please call your pharmacy.   Lab work: none If you have labs (blood work) drawn today and your tests are completely normal, you will receive your results only by: [name] Kitchen MyChart Message (if you have MyChart) OR . A paper copy in the mail If you have any lab test that is abnormal or we need to change your treatment, we will call you to review the results.  Testing/Procedures: none  Follow-Up: 3 months with Dr Johnsie Cancel At [facility], you and your health needs are our priority.  As part of our continuing mission to provide you with exceptional heart care, we have created designated Provider Care Teams.  These Care Teams include your primary Cardiologist (physician) and Advanced Practice Providers (APPs -  Physician Assistants and Nurse Practitioners) who all work together to provide you with the care you need, when you need it. .   Any Other Special Instructions Will Be Listed Below (If Applicable).

## 2018-07-04 NOTE — Patient Instructions (Addendum)
Medication Instructions:   If you need a refill on your cardiac medications before your next appointment, please call your pharmacy.   Lab work: Your physician recommends that you have lab work today- BMET, CBC, and BNP  If you have labs (blood work) drawn today and your tests are completely normal, you will receive your results only by: [name] Kitchen MyChart Message (if you have MyChart) OR . A paper copy in the mail If you have any lab test that is abnormal or we need to change your treatment, we will call you to review the results.  Testing/Procedures: A chest x-ray takes a picture of the organs and structures inside the chest, including the heart, lungs, and blood vessels. This test can show several things, including, whether the heart is enlarges; whether fluid is building up in the lungs; and whether pacemaker / defibrillator leads are still in place.  Follow-Up: At [facility], you and your health needs are our priority.  As part of our continuing mission to provide you with exceptional heart care, we have created designated Provider Care Teams.  These Care Teams include your primary Cardiologist (physician) and Advanced Practice Providers (APPs -  Physician Assistants and Nurse Practitioners) who all work together to provide you with the care you need, when you need it. You will need a follow up appointment in 3 months.  You may see Jenkins Rouge, MD or one of the following Advanced Practice Providers on your designated Care Team:   Truitt Merle, NP Cecilie Kicks, NP . Kathyrn Drown, NP  Your physician recommends that you schedule a follow-up appointment with provider in the office today or tomorrow.

## 2018-07-05 LAB — CBC WITH DIFFERENTIAL/PLATELET
Basophils Absolute: 0.1 10*3/uL (ref 0.0–0.2)
Basos: 1 %
EOS (ABSOLUTE): 0.2 10*3/uL (ref 0.0–0.4)
Eos: 2 %
Hematocrit: 42.4 % (ref 37.5–51.0)
Hemoglobin: 14.8 g/dL (ref 13.0–17.7)
Immature Grans (Abs): 0 10*3/uL (ref 0.0–0.1)
Immature Granulocytes: 0 %
Lymphocytes Absolute: 2.6 10*3/uL (ref 0.7–3.1)
Lymphs: 27 %
MCH: 32.7 pg (ref 26.6–33.0)
MCHC: 34.9 g/dL (ref 31.5–35.7)
MCV: 94 fL (ref 79–97)
Monocytes Absolute: 1.1 10*3/uL — ABNORMAL HIGH (ref 0.1–0.9)
Monocytes: 12 %
Neutrophils Absolute: 5.6 10*3/uL (ref 1.4–7.0)
Neutrophils: 58 %
Platelets: 279 10*3/uL (ref 150–450)
RBC: 4.52 x10E6/uL (ref 4.14–5.80)
RDW: 12.5 % (ref 11.6–15.4)
WBC: 9.6 10*3/uL (ref 3.4–10.8)

## 2018-07-05 LAB — BASIC METABOLIC PANEL
BUN/Creatinine Ratio: 24 (ref 10–24)
BUN: 21 mg/dL (ref 8–27)
CO2: 27 mmol/L (ref 20–29)
Calcium: 9.9 mg/dL (ref 8.6–10.2)
Chloride: 95 mmol/L — ABNORMAL LOW (ref 96–106)
Creatinine, Ser: 0.89 mg/dL (ref 0.76–1.27)
GFR calc Af Amer: 96 mL/min/{1.73_m2} (ref >59)
GFR calc non Af Amer: 83 mL/min/{1.73_m2} (ref >59)
Glucose: 214 mg/dL — ABNORMAL HIGH (ref 65–99)
Potassium: 4.7 mmol/L (ref 3.5–5.2)
Sodium: 138 mmol/L (ref 134–144)

## 2018-07-05 LAB — PRO B NATRIURETIC PEPTIDE: NT-Pro BNP: 137 pg/mL (ref 0–486)

## 2018-07-08 ENCOUNTER — Emergency Department (HOSPITAL_COMMUNITY): Payer: PPO

## 2018-07-08 ENCOUNTER — Other Ambulatory Visit: Payer: Self-pay

## 2018-07-08 ENCOUNTER — Encounter (HOSPITAL_COMMUNITY): Payer: Self-pay | Admitting: Emergency Medicine

## 2018-07-08 ENCOUNTER — Inpatient Hospital Stay (HOSPITAL_COMMUNITY)
Admission: EM | Admit: 2018-07-08 | Discharge: 2018-07-11 | DRG: 536 | Disposition: A | Discharge status: Home Health | Payer: PPO | Attending: Internal Medicine | Admitting: Internal Medicine

## 2018-07-08 DIAGNOSIS — Z951 Presence of aortocoronary bypass graft: Secondary | ICD-10-CM

## 2018-07-08 DIAGNOSIS — Z1159 Encounter for screening for other viral diseases: Secondary | ICD-10-CM | POA: Diagnosis not present

## 2018-07-08 DIAGNOSIS — E785 Hyperlipidemia, unspecified: Secondary | ICD-10-CM | POA: Diagnosis present

## 2018-07-08 DIAGNOSIS — Z90411 Acquired partial absence of pancreas: Secondary | ICD-10-CM

## 2018-07-08 DIAGNOSIS — S79912A Unspecified injury of left hip, initial encounter: Secondary | ICD-10-CM | POA: Diagnosis not present

## 2018-07-08 DIAGNOSIS — Z794 Long term (current) use of insulin: Secondary | ICD-10-CM | POA: Diagnosis not present

## 2018-07-08 DIAGNOSIS — I248 Other forms of acute ischemic heart disease: Secondary | ICD-10-CM | POA: Diagnosis not present

## 2018-07-08 DIAGNOSIS — H9190 Unspecified hearing loss, unspecified ear: Secondary | ICD-10-CM | POA: Diagnosis present

## 2018-07-08 DIAGNOSIS — S32402A Unspecified fracture of left acetabulum, initial encounter for closed fracture: Secondary | ICD-10-CM | POA: Diagnosis not present

## 2018-07-08 DIAGNOSIS — E1159 Type 2 diabetes mellitus with other circulatory complications: Secondary | ICD-10-CM | POA: Diagnosis present

## 2018-07-08 DIAGNOSIS — R7989 Other specified abnormal findings of blood chemistry: Secondary | ICD-10-CM

## 2018-07-08 DIAGNOSIS — E7800 Pure hypercholesterolemia, unspecified: Secondary | ICD-10-CM | POA: Diagnosis not present

## 2018-07-08 DIAGNOSIS — W11XXXA Fall on and from ladder, initial encounter: Secondary | ICD-10-CM | POA: Diagnosis present

## 2018-07-08 DIAGNOSIS — Z833 Family history of diabetes mellitus: Secondary | ICD-10-CM | POA: Diagnosis not present

## 2018-07-08 DIAGNOSIS — Z20828 Contact with and (suspected) exposure to other viral communicable diseases: Secondary | ICD-10-CM | POA: Diagnosis not present

## 2018-07-08 DIAGNOSIS — E1151 Type 2 diabetes mellitus with diabetic peripheral angiopathy without gangrene: Secondary | ICD-10-CM | POA: Diagnosis present

## 2018-07-08 DIAGNOSIS — I10 Essential (primary) hypertension: Secondary | ICD-10-CM | POA: Diagnosis present

## 2018-07-08 DIAGNOSIS — S299XXA Unspecified injury of thorax, initial encounter: Secondary | ICD-10-CM | POA: Diagnosis not present

## 2018-07-08 DIAGNOSIS — I2510 Atherosclerotic heart disease of native coronary artery without angina pectoris: Secondary | ICD-10-CM | POA: Diagnosis present

## 2018-07-08 DIAGNOSIS — Z7982 Long term (current) use of aspirin: Secondary | ICD-10-CM | POA: Diagnosis not present

## 2018-07-08 DIAGNOSIS — S32452A Displaced transverse fracture of left acetabulum, initial encounter for closed fracture: Secondary | ICD-10-CM | POA: Diagnosis not present

## 2018-07-08 DIAGNOSIS — G8929 Other chronic pain: Secondary | ICD-10-CM | POA: Diagnosis present

## 2018-07-08 DIAGNOSIS — S32409A Unspecified fracture of unspecified acetabulum, initial encounter for closed fracture: Secondary | ICD-10-CM

## 2018-07-08 DIAGNOSIS — Z8507 Personal history of malignant neoplasm of pancreas: Secondary | ICD-10-CM | POA: Diagnosis not present

## 2018-07-08 DIAGNOSIS — R778 Other specified abnormalities of plasma proteins: Secondary | ICD-10-CM

## 2018-07-08 DIAGNOSIS — Y92008 Other place in unspecified non-institutional (private) residence as the place of occurrence of the external cause: Secondary | ICD-10-CM | POA: Diagnosis not present

## 2018-07-08 DIAGNOSIS — R42 Dizziness and giddiness: Secondary | ICD-10-CM | POA: Diagnosis not present

## 2018-07-08 DIAGNOSIS — K5900 Constipation, unspecified: Secondary | ICD-10-CM | POA: Diagnosis present

## 2018-07-08 DIAGNOSIS — R079 Chest pain, unspecified: Secondary | ICD-10-CM

## 2018-07-08 DIAGNOSIS — E119 Type 2 diabetes mellitus without complications: Secondary | ICD-10-CM | POA: Diagnosis present

## 2018-07-08 DIAGNOSIS — I25110 Atherosclerotic heart disease of native coronary artery with unstable angina pectoris: Secondary | ICD-10-CM | POA: Diagnosis not present

## 2018-07-08 DIAGNOSIS — Z79899 Other long term (current) drug therapy: Secondary | ICD-10-CM | POA: Diagnosis not present

## 2018-07-08 DIAGNOSIS — Z9081 Acquired absence of spleen: Secondary | ICD-10-CM | POA: Diagnosis not present

## 2018-07-08 DIAGNOSIS — Z9049 Acquired absence of other specified parts of digestive tract: Secondary | ICD-10-CM | POA: Diagnosis not present

## 2018-07-08 DIAGNOSIS — R0902 Hypoxemia: Secondary | ICD-10-CM | POA: Diagnosis not present

## 2018-07-08 DIAGNOSIS — M25552 Pain in left hip: Secondary | ICD-10-CM | POA: Diagnosis not present

## 2018-07-08 DIAGNOSIS — K219 Gastro-esophageal reflux disease without esophagitis: Secondary | ICD-10-CM | POA: Diagnosis present

## 2018-07-08 DIAGNOSIS — T1490XA Injury, unspecified, initial encounter: Secondary | ICD-10-CM

## 2018-07-08 DIAGNOSIS — N200 Calculus of kidney: Secondary | ICD-10-CM | POA: Diagnosis not present

## 2018-07-08 DIAGNOSIS — W19XXXA Unspecified fall, initial encounter: Secondary | ICD-10-CM | POA: Diagnosis not present

## 2018-07-08 DIAGNOSIS — R0789 Other chest pain: Secondary | ICD-10-CM | POA: Diagnosis not present

## 2018-07-08 HISTORY — DX: Unspecified fracture of unspecified acetabulum, initial encounter for closed fracture: S32.409A

## 2018-07-08 LAB — SARS CORONAVIRUS 2 BY RT PCR (HOSPITAL ORDER, PERFORMED IN ~~LOC~~ HOSPITAL LAB): SARS Coronavirus 2: NEGATIVE

## 2018-07-08 LAB — CBC
HCT: 43.6 % (ref 39.0–52.0)
Hemoglobin: 14.5 g/dL (ref 13.0–17.0)
MCH: 31.3 pg (ref 26.0–34.0)
MCHC: 33.3 g/dL (ref 30.0–36.0)
MCV: 94 fL (ref 80.0–100.0)
Platelets: 223 10*3/uL (ref 150–400)
RBC: 4.64 MIL/uL (ref 4.22–5.81)
RDW: 12.7 % (ref 11.5–15.5)
WBC: 17.9 10*3/uL — ABNORMAL HIGH (ref 4.0–10.5)
nRBC: 0 % (ref 0.0–0.2)

## 2018-07-08 LAB — URINALYSIS, ROUTINE W REFLEX MICROSCOPIC
Bilirubin Urine: NEGATIVE
Glucose, UA: NEGATIVE mg/dL
Hgb urine dipstick: NEGATIVE
Ketones, ur: NEGATIVE mg/dL
Leukocytes,Ua: NEGATIVE
Nitrite: NEGATIVE
Protein, ur: NEGATIVE mg/dL
Specific Gravity, Urine: 1.018 (ref 1.005–1.030)
pH: 6 (ref 5.0–8.0)

## 2018-07-08 LAB — BASIC METABOLIC PANEL
Anion gap: 14 (ref 5–15)
BUN: 27 mg/dL — ABNORMAL HIGH (ref 8–23)
CO2: 21 mmol/L — ABNORMAL LOW (ref 22–32)
Calcium: 9.5 mg/dL (ref 8.9–10.3)
Chloride: 99 mmol/L (ref 98–111)
Creatinine, Ser: 1.04 mg/dL (ref 0.61–1.24)
GFR calc Af Amer: >60 mL/min (ref >60)
GFR calc non Af Amer: >60 mL/min (ref >60)
Glucose, Bld: 189 mg/dL — ABNORMAL HIGH (ref 70–99)
Potassium: 4.2 mmol/L (ref 3.5–5.1)
Sodium: 134 mmol/L — ABNORMAL LOW (ref 135–145)

## 2018-07-08 LAB — POCT I-STAT 7, (LYTES, BLD GAS, ICA,H+H)
Acid-Base Excess: 3 mmol/L — ABNORMAL HIGH (ref 0.0–2.0)
Bicarbonate: 28.7 mmol/L — ABNORMAL HIGH (ref 20.0–28.0)
Calcium, Ion: 1.27 mmol/L (ref 1.15–1.40)
HCT: 42 % (ref 39.0–52.0)
Hemoglobin: 14.3 g/dL (ref 13.0–17.0)
O2 Saturation: 95 %
Patient temperature: 98.1
Potassium: 3.8 mmol/L (ref 3.5–5.1)
Sodium: 134 mmol/L — ABNORMAL LOW (ref 135–145)
TCO2: 30 mmol/L (ref 22–32)
pCO2 arterial: 44.5 mmHg (ref 32.0–48.0)
pH, Arterial: 7.417 (ref 7.350–7.450)
pO2, Arterial: 74 mmHg — ABNORMAL LOW (ref 83.0–108.0)

## 2018-07-08 LAB — BRAIN NATRIURETIC PEPTIDE: B Natriuretic Peptide: 37.8 pg/mL (ref 0.0–100.0)

## 2018-07-08 LAB — TROPONIN I: Troponin I: <0.03 ng/mL (ref <0.03)

## 2018-07-08 MED ORDER — MORPHINE SULFATE (PF) 4 MG/ML IV SOLN
4.0000 mg | Freq: Once | INTRAVENOUS | Status: AC
  Administered 2018-07-08: 4 mg via INTRAVENOUS
  Filled 2018-07-08: qty 1

## 2018-07-08 MED ORDER — DIPHENHYDRAMINE HCL 50 MG/ML IJ SOLN
50.0000 mg | Freq: Once | INTRAMUSCULAR | Status: AC
  Administered 2018-07-09: 50 mg via INTRAVENOUS
  Filled 2018-07-08: qty 1

## 2018-07-08 MED ORDER — FENTANYL CITRATE (PF) 100 MCG/2ML IJ SOLN
50.0000 ug | Freq: Once | INTRAMUSCULAR | Status: AC
  Administered 2018-07-08: 50 ug via INTRAVENOUS
  Filled 2018-07-08: qty 2

## 2018-07-08 MED ORDER — HYDROCORTISONE NA SUCCINATE PF 250 MG IJ SOLR
200.0000 mg | Freq: Once | INTRAMUSCULAR | Status: AC
  Administered 2018-07-08: 200 mg via INTRAVENOUS
  Filled 2018-07-08: qty 200

## 2018-07-08 MED ORDER — DIPHENHYDRAMINE HCL 25 MG PO CAPS: 50.0000 mg | ORAL_CAPSULE | Freq: Once | ORAL | Status: AC

## 2018-07-08 MED ORDER — SODIUM CHLORIDE 0.9% FLUSH
3.0000 mL | Freq: Once | INTRAVENOUS | Status: AC
  Administered 2018-07-08: 3 mL via INTRAVENOUS

## 2018-07-08 NOTE — ED Triage Notes (Signed)
Pt had syncopal episode on 2 steps, tenderness on left hip, no deformity. Pt had chest pain when syncope occurred. Unable to bear weight. EKG NSR, 02 88% room air, 2L on nasal cannula. Initial BP 112/40. Occurred at 1600. No fever, cough, SOB

## 2018-07-08 NOTE — ED Notes (Signed)
Pt CBG 166, nurse notified.

## 2018-07-08 NOTE — ED Notes (Signed)
Ricardo Le (wife) [phone]

## 2018-07-08 NOTE — ED Notes (Signed)
Pt returned from xray

## 2018-07-08 NOTE — ED Provider Notes (Signed)
[facility] EMERGENCY DEPARTMENT Provider Note   CSN: [id number] Arrival date & time: 07/08/18  1814    History   Chief Complaint No chief complaint on file.   HPI Ricardo Le is a 78 y.o. male.     HPI  78 year old male presents after a fall off a ladder.  He was trying to put seal around the door and when he was try to come off the ladder he missed a step and fell about 2 or 3 rungs.  Severe left posterior hip pain.  Has not been able to get up to walk.  He has chronic abdominal pain for several months that he states is slowly worsening.  It causes radiation up into his chest at night.  This is not new or different necessarily and there is no vomiting.  Otherwise no new complaints.  Past Medical History:  Diagnosis Date  . CAD (coronary artery disease)    a. 04/2007 s/p CABG x 4  - LIMA->LAD, VG->OM1->Om2, VG->PDA;  b. 12/2008 & 06/2010 Caths - Native multivessel dzs with 4/4 patent grafts. c. cath 03/09/2015 patent grafts, unchanged from prior cath.  . Cancer [facility])    pancreatic  March 2011  . Carotid bruit    a. 07/2010 U/S- 0-39% bilat ICA stenosis  . Chest pain    Noncardiac probably related to reflux  . DDD (degenerative disc disease)    several surgeries  . GERD (gastroesophageal reflux disease)   . HNP (herniated nucleus pulposus), lumbar    a. L2-3, s/p laminotomy, microdiskectomy 02/2009  . HOH (hard of hearing)    uses amplifiers   . Nephrolithiasis   . PAD (peripheral artery disease) ([place]) 04/2014   s/p L Common Iliac Stent  . Palpitations   . 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 Pneumonia    hx of  pna  . Pure hypercholesterolemia    statin intolerance  . Type II or unspecified type diabetes mellitus without mention of complication, not stated as uncontrolled    insulin dependent  . Unspecified essential hypertension     Patient Active Problem List   Diagnosis Date Noted  . Coronary  artery disease involving native coronary artery of native heart with unstable angina pectoris ([place])   . Chest pain 02/20/2017  . Precordial chest pain   . Angina pectoris ([place]) 02/19/2017  . Atherosclerotic heart disease of native coronary artery with unstable angina pectoris ([place]) 03/08/2015  . Hyperlipidemia 01/26/2015  . Cervical spondylosis without myelopathy 07/28/2014  . Cervical spondylosis 07/28/2014  . Constipation 05/19/2014  . Pain 05/18/2014  . PAD (peripheral artery disease) ([place]) 03/20/2012  . Unstable angina ([place]) 05/05/2011  . DDD (degenerative disc disease)   . HNP (herniated nucleus pulposus), lumbar   . Pancreatic mass   . Nephrolithiasis   . Carotid bruit   . S/P CABG x 4 03/08/2011  . Bruit 07/06/2010  . NONSPECIFIC ABN FINDING RAD & OTH EXAM GI TRACT 06/09/2009  . NAUSEA ALONE 05/28/2009  . CHANGE IN BOWELS 05/28/2009  . Elevated lipids 05/27/2009  . ABDOMINAL PAIN, GENERALIZED 05/27/2009  . CONSTIPATION 05/10/2009  . Type 2 diabetes mellitus with vascular disease ([place]) 12/16/2008  . Essential hypertension 12/16/2008  . BACK PAIN 12/16/2008  . Palpitations 12/16/2008    Past Surgical History:  Procedure Laterality Date  . ABDOMINAL AORTAGRAM N/A 05/13/2014   Procedure: ABDOMINAL [name];  Surgeon: Wellington Hampshire, MD;  Location: [facility] CATH LAB;  Service: Cardiovascular;  Laterality: N/A;  . ANTERIOR CERVICAL DECOMP/DISCECTOMY FUSION N/A 07/28/2014   Procedure: ANTERIOR CERVICAL DECOMPRESSION/DISCECTOMY FUSION CERVICAL FOUR-FIVE CERVICAL FIVE-SIX ;  Surgeon: Earnie Larsson, MD;  Location: [facility] NEURO ORS;  Service: Neurosurgery;  Laterality: N/A;  . APPENDECTOMY    . arthroscopy right knee    . BACK SURGERY    . CARDIAC CATHETERIZATION N/A 03/09/2015   Procedure: Left Heart Cath and Cors/Grafts Angiography;  Surgeon: [name] Sine, MD;  Location: [facility] CV LAB;  Service: Cardiovascular;  Laterality: N/A;  . CHOLECYSTECTOMY    . CORONARY ARTERY BYPASS GRAFT      X 4 2009 (LIMA to LAD, SVG to first cicumflex marginal branch with sequential SVG to second cicumflex marginal branch  and saphenous vein to posterior descending coronary artery with endoscopic,vein harvest rt. lower exttremity by Dr.Owen on March 31,2009  . CYST REMOVED     FROM SPINE  . CYSTOURETHROSCOPY     right retrograde pyelogram,manipulate stone in the renal pelvis, rt. double-j catheter.  [name] Kitchen EYE SURGERY    . KNEE SURGERY     rt knee  . LEFT HEART CATH AND CORS/GRAFTS ANGIOGRAPHY N/A 02/20/2017   Procedure: LEFT HEART CATH AND CORS/GRAFTS ANGIOGRAPHY;  Surgeon: Leonie Man, MD;  Location: [facility] CV LAB;  Service: Cardiovascular;  Laterality: N/A;  . LEFT HEART CATH AND CORS/GRAFTS ANGIOGRAPHY N/A 05/28/2018   Procedure: LEFT HEART CATH AND CORS/GRAFTS ANGIOGRAPHY;  Surgeon: Burnell Blanks, MD;  Location: [facility] CV LAB;  Service: Cardiovascular;  Laterality: N/A;  . NEPHROLITHOTOMY     PERCUTANEOUS  . PARS PLANA VITRECTOMY     lt. eye,retinal photocoagulation lt. eye, membrane peel lt. eye.  [name] Kitchen PERCUTANEOUS STENT INTERVENTION Left 05/13/2014   Procedure: PERCUTANEOUS STENT INTERVENTION;  Surgeon: Wellington Hampshire, MD;  Location: [facility] CATH LAB;  Service: Cardiovascular;  Laterality: Left;  COMMON ILLIAC  . re-exploratin of laminectomy  05/2008   (RT L2-3) WITH REDO MICRODISKECTOMY        Home Medications    Prior to Admission medications   Medication Sig Start Date End Date Taking? Authorizing Provider  acetaminophen (TYLENOL 8 HOUR ARTHRITIS PAIN) 650 MG CR tablet Take 650 mg by mouth every 8 (eight) hours as needed for pain (arthritis).    [provider]  acetaminophen (TYLENOL) 325 MG tablet Take 650 mg by mouth every 6 (six) hours as needed (pain).    [provider]  amLODipine (NORVASC) 10 MG tablet Take 10 mg by mouth daily. 08/16/16   [provider]  aspirin EC 81 MG tablet Take 81 mg by mouth at bedtime.    [provider]  atorvastatin (LIPITOR) 20 MG tablet Take 20 mg by mouth daily.  04/14/18   [provider]  esomeprazole (NEXIUM) 40 MG capsule Take 40 mg by mouth daily. 06/18/18   [provider]  gabapentin (NEURONTIN) 100 MG capsule Take 100 mg by mouth 2 (two) times daily.    [provider]  glipiZIDE (GLUCOTROL) 10 MG tablet Take 10 mg by mouth 2 (two) times daily.     [provider]  insulin glargine (LANTUS SOLOSTAR) 100 UNIT/ML injection Inject 15 Units into the skin at bedtime.     [provider]  lisinopril-hydrochlorothiazide (PRINZIDE,ZESTORETIC) 20-12.5 MG per tablet Take 1 tablet by mouth daily.      [provider]  Multiple Vitamin (MULTIVITAMIN WITH MINERALS) TABS tablet Take 1 tablet by  mouth daily.    [provider]  nitroGLYCERIN (NITROSTAT) 0.4 MG SL tablet Place 1 tablet (0.4 mg total) under the tongue every 5 (five) minutes as needed for chest pain. 05/02/18 05/01/19  Josue Hector, MD  ranolazine (RANEXA) 500 MG 12 hr tablet Take 1 tablet (500 mg total) by mouth 2 (two) times daily. 06/10/18   Burtis Junes, NP  tamsulosin (FLOMAX) 0.4 MG CAPS capsule Take 0.4 mg by mouth daily after supper.    [provider]    Family History Family History  Problem Relation Age of Onset  . Heart attack Father        died of MI @ 29  . Diabetes Brother        type 2  . Other Mother        died @ 47  . Coronary artery disease Sister        alive  . Other Other        no fh of colon cancer  . Coronary artery disease Brother        s/p CABG.  Alive @ 17    Social History Social History   Tobacco Use  . Smoking status: Never Smoker  . Smokeless tobacco: Never Used  Substance Use Topics  . Alcohol use: No  . Drug use: No     Allergies   Rosuvastatin calcium; Iohexol; and Statins   Review of Systems Review of Systems  Constitutional: Negative for fever.  Respiratory: Negative for cough and  shortness of breath.   Cardiovascular: Negative for chest pain.  Gastrointestinal: Positive for abdominal pain. Negative for vomiting.  Musculoskeletal: Positive for arthralgias.  All other systems reviewed and are negative.    Physical Exam Updated Vital Signs BP (!) 159/63   Pulse 85   Temp 98.1 F (36.7 C) (Oral)   Resp 20   SpO2 95%   Physical Exam Vitals signs and nursing note reviewed.  Constitutional:      General: He is not in acute distress.    Appearance: He is well-developed. He is not ill-appearing or diaphoretic.  HENT:     Head: Normocephalic and atraumatic.     Right Ear: External ear normal.     Left Ear: External ear normal.     Nose: Nose normal.  Eyes:     General:        Right eye: No discharge.        Left eye: No discharge.  Neck:     Musculoskeletal: Neck supple.  Cardiovascular:     Rate and Rhythm: Normal rate and regular rhythm.     Pulses:          Dorsalis pedis pulses are 2+ on the right side and 2+ on the left side.     Heart sounds: Normal heart sounds.  Pulmonary:     Effort: Pulmonary effort is normal.     Breath sounds: Normal breath sounds.  Abdominal:     Palpations: Abdomen is soft.     Tenderness: There is no abdominal tenderness.  Musculoskeletal:     Left hip: He exhibits decreased range of motion, tenderness and bony tenderness. He exhibits no deformity.     Left knee: No tenderness found.     Left upper leg: He exhibits no tenderness.       Legs:  Skin:    General: Skin is warm and dry.  Neurological:     Mental Status: He is alert.  Psychiatric:        Mood and Affect: Mood is not anxious.      ED Treatments / Results  Labs (all labs ordered are listed, but only abnormal results are displayed) Labs Reviewed  BASIC METABOLIC PANEL - Abnormal; Notable for the following components:      Result Value   Sodium 134 (*)    CO2 21 (*)    Glucose, Bld 189 (*)    BUN 27 (*)    All other components within normal  limits  CBC - Abnormal; Notable for the following components:   WBC 17.9 (*)    All other components within normal limits  POCT I-STAT 7, (LYTES, BLD GAS, ICA,H+H) - Abnormal; Notable for the following components:   pO2, Arterial 74.0 (*)    Bicarbonate 28.7 (*)    Acid-Base Excess 3.0 (*)    Sodium 134 (*)    All other components within normal limits  SARS CORONAVIRUS 2 (HOSPITAL ORDER, [facility])  URINALYSIS, ROUTINE W REFLEX MICROSCOPIC  TROPONIN I  BRAIN NATRIURETIC PEPTIDE  TROPONIN I  CBG MONITORING, ED  I-STAT ARTERIAL BLOOD GAS, ED    EKG None  Radiology Dg Chest 1 View  Result Date: 07/08/2018 CLINICAL DATA:  Fall from ladder with chest pain, initial encounter EXAM: CHEST  1 VIEW COMPARISON:  07/04/2018 FINDINGS: Cardiac shadow is within normal limits. Postsurgical changes. The lungs are well aerated with mild left basilar atelectasis. No focal infiltrate is seen. No acute bony is noted. IMPRESSION: Mild left basilar atelectasis. Electronically Signed   By: Inez Catalina M.D.   On: 07/08/2018 21:02   Dg Hip Unilat W Or Wo Pelvis 2-3 Views Left  Result Date: 07/08/2018 CLINICAL DATA:  Recent fall from ladder with left hip pain, initial encounter EXAM: DG HIP (WITH OR WITHOUT PELVIS) 3V LEFT COMPARISON:  05/19/2014 FINDINGS: Pelvic ring is intact. Degenerative changes of lumbar spine and hip joints are noted. No acute fractures noted. IMPRESSION: No fracture identified. Electronically Signed   By: Inez Catalina M.D.   On: 07/08/2018 21:03    Procedures Procedures (including critical care time)  Medications Ordered in ED Medications  diphenhydrAMINE (BENADRYL) capsule 50 mg (has no administration in time range)    Or  diphenhydrAMINE (BENADRYL) injection 50 mg (has no administration in time range)  sodium chloride flush (NS) 0.9 % injection 3 mL (3 mLs Intravenous Given 07/08/18 1946)  fentaNYL (SUBLIMAZE) injection 50 mcg (50 mcg Intravenous  Given 07/08/18 1947)  hydrocortisone sodium succinate (SOLU-CORTEF) injection 200 mg (200 mg Intravenous Given 07/08/18 2142)  morphine 4 MG/ML injection 4 mg (4 mg Intravenous Given 07/08/18 2144)     Initial Impression / Assessment and Plan / ED Course  I have reviewed the triage vital signs and the nursing notes.  Pertinent labs & imaging results that were available during my care of the patient were reviewed by me and considered in my medical decision making (see chart for details).        Patient states he is had some intermittent chest pain but this is not a new thing.  He is noted to be hypoxic to 88% on room air prior to any type of medicines.  Confirmed on ABG.  Unclear cause.  As far as his hip, he still having intractable pain and unable to move it despite IV fentanyl.  On reexamination there is no lumbar or sacral tenderness but tenderness over his left buttock/posterior hip.  Will get CT of the hip.  He also has chronic abdominal pain for several months that has not been worked up.  Given he is getting a CT angiography to work-up the hypoxia, will also do CT abdomen pelvis given age and worsening abdominal pain.  Care to Dr. Wyvonnia Dusky.  Final Clinical Impressions(s) / ED Diagnoses   Final diagnoses:  Trauma    ED Discharge Orders    None       Sherwood Gambler, MD 07/08/18 2312

## 2018-07-09 ENCOUNTER — Emergency Department (HOSPITAL_COMMUNITY): Payer: PPO

## 2018-07-09 ENCOUNTER — Encounter (HOSPITAL_COMMUNITY): Payer: Self-pay

## 2018-07-09 DIAGNOSIS — R7989 Other specified abnormal findings of blood chemistry: Secondary | ICD-10-CM

## 2018-07-09 DIAGNOSIS — I10 Essential (primary) hypertension: Secondary | ICD-10-CM | POA: Diagnosis present

## 2018-07-09 DIAGNOSIS — E1159 Type 2 diabetes mellitus with other circulatory complications: Secondary | ICD-10-CM | POA: Diagnosis not present

## 2018-07-09 DIAGNOSIS — Z79899 Other long term (current) drug therapy: Secondary | ICD-10-CM | POA: Diagnosis not present

## 2018-07-09 DIAGNOSIS — Z9081 Acquired absence of spleen: Secondary | ICD-10-CM | POA: Diagnosis not present

## 2018-07-09 DIAGNOSIS — S32402A Unspecified fracture of left acetabulum, initial encounter for closed fracture: Secondary | ICD-10-CM | POA: Diagnosis present

## 2018-07-09 DIAGNOSIS — E7800 Pure hypercholesterolemia, unspecified: Secondary | ICD-10-CM | POA: Diagnosis present

## 2018-07-09 DIAGNOSIS — Z8507 Personal history of malignant neoplasm of pancreas: Secondary | ICD-10-CM | POA: Diagnosis not present

## 2018-07-09 DIAGNOSIS — K219 Gastro-esophageal reflux disease without esophagitis: Secondary | ICD-10-CM | POA: Diagnosis present

## 2018-07-09 DIAGNOSIS — I25110 Atherosclerotic heart disease of native coronary artery with unstable angina pectoris: Secondary | ICD-10-CM | POA: Diagnosis present

## 2018-07-09 DIAGNOSIS — Z90411 Acquired partial absence of pancreas: Secondary | ICD-10-CM | POA: Diagnosis not present

## 2018-07-09 DIAGNOSIS — G8929 Other chronic pain: Secondary | ICD-10-CM | POA: Diagnosis present

## 2018-07-09 DIAGNOSIS — Y92008 Other place in unspecified non-institutional (private) residence as the place of occurrence of the external cause: Secondary | ICD-10-CM | POA: Diagnosis not present

## 2018-07-09 DIAGNOSIS — R079 Chest pain, unspecified: Secondary | ICD-10-CM | POA: Diagnosis not present

## 2018-07-09 DIAGNOSIS — I248 Other forms of acute ischemic heart disease: Secondary | ICD-10-CM | POA: Diagnosis present

## 2018-07-09 DIAGNOSIS — Z9049 Acquired absence of other specified parts of digestive tract: Secondary | ICD-10-CM | POA: Diagnosis not present

## 2018-07-09 DIAGNOSIS — K5900 Constipation, unspecified: Secondary | ICD-10-CM | POA: Diagnosis present

## 2018-07-09 DIAGNOSIS — Z1159 Encounter for screening for other viral diseases: Secondary | ICD-10-CM | POA: Diagnosis not present

## 2018-07-09 DIAGNOSIS — E1151 Type 2 diabetes mellitus with diabetic peripheral angiopathy without gangrene: Secondary | ICD-10-CM | POA: Diagnosis present

## 2018-07-09 DIAGNOSIS — H9190 Unspecified hearing loss, unspecified ear: Secondary | ICD-10-CM | POA: Diagnosis present

## 2018-07-09 DIAGNOSIS — Z951 Presence of aortocoronary bypass graft: Secondary | ICD-10-CM | POA: Diagnosis not present

## 2018-07-09 DIAGNOSIS — Z7982 Long term (current) use of aspirin: Secondary | ICD-10-CM | POA: Diagnosis not present

## 2018-07-09 DIAGNOSIS — E785 Hyperlipidemia, unspecified: Secondary | ICD-10-CM | POA: Diagnosis present

## 2018-07-09 DIAGNOSIS — M25552 Pain in left hip: Secondary | ICD-10-CM | POA: Diagnosis present

## 2018-07-09 DIAGNOSIS — Z794 Long term (current) use of insulin: Secondary | ICD-10-CM | POA: Diagnosis not present

## 2018-07-09 DIAGNOSIS — R778 Other specified abnormalities of plasma proteins: Secondary | ICD-10-CM

## 2018-07-09 DIAGNOSIS — Z833 Family history of diabetes mellitus: Secondary | ICD-10-CM | POA: Diagnosis not present

## 2018-07-09 DIAGNOSIS — W11XXXA Fall on and from ladder, initial encounter: Secondary | ICD-10-CM | POA: Diagnosis present

## 2018-07-09 LAB — HEPARIN LEVEL (UNFRACTIONATED): Heparin Unfractionated: 0.3 IU/mL (ref 0.30–0.70)

## 2018-07-09 LAB — TROPONIN I
Troponin I: 0.03 ng/mL (ref <0.03)
Troponin I: 0.13 ng/mL (ref <0.03)
Troponin I: 0.28 ng/mL (ref <0.03)
Troponin I: 0.99 ng/mL (ref <0.03)
Troponin I: 0.97 ng/mL (ref <0.03)

## 2018-07-09 LAB — GLUCOSE, CAPILLARY
Glucose-Capillary: 166 mg/dL — ABNORMAL HIGH (ref 70–99)
Glucose-Capillary: 291 mg/dL — ABNORMAL HIGH (ref 70–99)
Glucose-Capillary: 206 mg/dL — ABNORMAL HIGH (ref 70–99)
Glucose-Capillary: 261 mg/dL — ABNORMAL HIGH (ref 70–99)
Glucose-Capillary: 236 mg/dL — ABNORMAL HIGH (ref 70–99)
Glucose-Capillary: 262 mg/dL — ABNORMAL HIGH (ref 70–99)

## 2018-07-09 LAB — HEMOGLOBIN A1C
Hgb A1c MFr Bld: 7.3 % — ABNORMAL HIGH (ref 4.8–5.6)
Mean Plasma Glucose: 162.81 mg/dL

## 2018-07-09 MED ORDER — NITROGLYCERIN 0.4 MG SL SUBL: 0.4000 mg | SUBLINGUAL_TABLET | SUBLINGUAL | Status: DC | PRN

## 2018-07-09 MED ORDER — INSULIN ASPART 100 UNIT/ML ~~LOC~~ SOLN
0.0000 [IU] | Freq: Three times a day (TID) | SUBCUTANEOUS | Status: DC
  Administered 2018-07-09 – 2018-07-10 (×3): 3 [IU] via SUBCUTANEOUS
  Administered 2018-07-10: 2 [IU] via SUBCUTANEOUS
  Administered 2018-07-11: 3 [IU] via SUBCUTANEOUS
  Administered 2018-07-11: 5 [IU] via SUBCUTANEOUS

## 2018-07-09 MED ORDER — INSULIN ASPART 100 UNIT/ML ~~LOC~~ SOLN
0.0000 [IU] | SUBCUTANEOUS | Status: DC
  Administered 2018-07-09: 5 [IU] via SUBCUTANEOUS

## 2018-07-09 MED ORDER — DOCUSATE SODIUM 100 MG PO CAPS
100.0000 mg | ORAL_CAPSULE | Freq: Two times a day (BID) | ORAL | Status: DC
  Administered 2018-07-09 – 2018-07-11 (×5): 100 mg via ORAL
  Filled 2018-07-09 (×5): qty 1

## 2018-07-09 MED ORDER — INSULIN GLARGINE 100 UNIT/ML ~~LOC~~ SOLN
15.0000 [IU] | Freq: Every day | SUBCUTANEOUS | Status: DC
  Administered 2018-07-09 – 2018-07-10 (×2): 15 [IU] via SUBCUTANEOUS
  Filled 2018-07-09 (×3): qty 0.15

## 2018-07-09 MED ORDER — TAMSULOSIN HCL 0.4 MG PO CAPS
0.4000 mg | ORAL_CAPSULE | Freq: Every day | ORAL | Status: DC
  Administered 2018-07-09 – 2018-07-10 (×2): 0.4 mg via ORAL
  Filled 2018-07-09 (×2): qty 1

## 2018-07-09 MED ORDER — SENNA 8.6 MG PO TABS
1.0000 | ORAL_TABLET | Freq: Every day | ORAL | Status: DC
  Administered 2018-07-09 – 2018-07-11 (×3): 8.6 mg via ORAL
  Filled 2018-07-09 (×3): qty 1

## 2018-07-09 MED ORDER — AMLODIPINE BESYLATE 10 MG PO TABS
10.0000 mg | ORAL_TABLET | Freq: Every day | ORAL | Status: DC
  Administered 2018-07-09 – 2018-07-11 (×3): 10 mg via ORAL
  Filled 2018-07-09 (×3): qty 1

## 2018-07-09 MED ORDER — LISINOPRIL-HYDROCHLOROTHIAZIDE 20-12.5 MG PO TABS: 1.0000 | ORAL_TABLET | Freq: Every day | ORAL | Status: DC

## 2018-07-09 MED ORDER — ENOXAPARIN SODIUM 40 MG/0.4ML ~~LOC~~ SOLN: 40.0000 mg | SUBCUTANEOUS | Status: DC

## 2018-07-09 MED ORDER — ADULT MULTIVITAMIN W/MINERALS CH
1.0000 | ORAL_TABLET | Freq: Every day | ORAL | Status: DC
  Administered 2018-07-09 – 2018-07-11 (×3): 1 via ORAL
  Filled 2018-07-09 (×3): qty 1

## 2018-07-09 MED ORDER — ASPIRIN 81 MG PO CHEW
324.0000 mg | CHEWABLE_TABLET | Freq: Once | ORAL | Status: DC
  Filled 2018-07-09: qty 4

## 2018-07-09 MED ORDER — SORBITOL 70 % SOLN: 30.0000 mL | Freq: Every day | Status: DC | PRN

## 2018-07-09 MED ORDER — PANTOPRAZOLE SODIUM 40 MG PO TBEC
80.0000 mg | DELAYED_RELEASE_TABLET | Freq: Every day | ORAL | Status: DC
  Administered 2018-07-09 – 2018-07-11 (×3): 80 mg via ORAL
  Filled 2018-07-09 (×3): qty 2

## 2018-07-09 MED ORDER — ACETAMINOPHEN 325 MG PO TABS
650.0000 mg | ORAL_TABLET | Freq: Four times a day (QID) | ORAL | Status: DC | PRN
  Administered 2018-07-09 – 2018-07-11 (×4): 650 mg via ORAL
  Filled 2018-07-09 (×4): qty 2

## 2018-07-09 MED ORDER — GABAPENTIN 100 MG PO CAPS
100.0000 mg | ORAL_CAPSULE | Freq: Two times a day (BID) | ORAL | Status: DC
  Administered 2018-07-09 – 2018-07-11 (×5): 100 mg via ORAL
  Filled 2018-07-09 (×5): qty 1

## 2018-07-09 MED ORDER — ATORVASTATIN CALCIUM 10 MG PO TABS
20.0000 mg | ORAL_TABLET | Freq: Every day | ORAL | Status: DC
  Administered 2018-07-09 – 2018-07-11 (×3): 20 mg via ORAL
  Filled 2018-07-09 (×3): qty 2

## 2018-07-09 MED ORDER — ONDANSETRON HCL 4 MG/2ML IJ SOLN: 4.0000 mg | Freq: Four times a day (QID) | INTRAMUSCULAR | Status: DC | PRN

## 2018-07-09 MED ORDER — MORPHINE SULFATE (PF) 2 MG/ML IV SOLN
2.0000 mg | INTRAVENOUS | Status: DC | PRN
  Administered 2018-07-10: 2 mg via INTRAVENOUS
  Filled 2018-07-09: qty 1

## 2018-07-09 MED ORDER — IOHEXOL 350 MG/ML SOLN
100.0000 mL | Freq: Once | INTRAVENOUS | Status: AC | PRN
  Administered 2018-07-09: 100 mL via INTRAVENOUS

## 2018-07-09 MED ORDER — IOHEXOL 350 MG/ML SOLN: 100.0000 mL | Freq: Once | INTRAVENOUS | Status: DC | PRN

## 2018-07-09 MED ORDER — LISINOPRIL 20 MG PO TABS
20.0000 mg | ORAL_TABLET | Freq: Every day | ORAL | Status: DC
  Administered 2018-07-09 – 2018-07-11 (×3): 20 mg via ORAL
  Filled 2018-07-09 (×3): qty 1

## 2018-07-09 MED ORDER — ASPIRIN EC 81 MG PO TBEC
81.0000 mg | DELAYED_RELEASE_TABLET | Freq: Every day | ORAL | Status: DC
  Administered 2018-07-10: 81 mg via ORAL
  Filled 2018-07-09: qty 1

## 2018-07-09 MED ORDER — INSULIN ASPART 100 UNIT/ML ~~LOC~~ SOLN: 0.0000 [IU] | Freq: Three times a day (TID) | SUBCUTANEOUS | Status: DC

## 2018-07-09 MED ORDER — HEPARIN (PORCINE) 25000 UT/250ML-% IV SOLN
850.0000 [IU]/h | INTRAVENOUS | Status: DC
  Administered 2018-07-09: 850 [IU]/h via INTRAVENOUS
  Filled 2018-07-09: qty 250

## 2018-07-09 MED ORDER — HYDROCHLOROTHIAZIDE 12.5 MG PO CAPS
12.5000 mg | ORAL_CAPSULE | Freq: Every day | ORAL | Status: DC
  Administered 2018-07-09 – 2018-07-11 (×3): 12.5 mg via ORAL
  Filled 2018-07-09 (×3): qty 1

## 2018-07-09 MED ORDER — RANOLAZINE ER 500 MG PO TB12
500.0000 mg | ORAL_TABLET | Freq: Two times a day (BID) | ORAL | Status: DC
  Administered 2018-07-09 – 2018-07-11 (×5): 500 mg via ORAL
  Filled 2018-07-09 (×5): qty 1

## 2018-07-09 MED ORDER — HEPARIN BOLUS VIA INFUSION
4000.0000 [IU] | Freq: Once | INTRAVENOUS | Status: AC
  Administered 2018-07-09: 4000 [IU] via INTRAVENOUS
  Filled 2018-07-09: qty 4000

## 2018-07-09 MED ORDER — ONDANSETRON HCL 4 MG PO TABS: 4.0000 mg | ORAL_TABLET | Freq: Four times a day (QID) | ORAL | Status: DC | PRN

## 2018-07-09 NOTE — Plan of Care (Signed)

## 2018-07-09 NOTE — Consult Note (Signed)
ORTHOPAEDIC CONSULTATION  REQUESTING PHYSICIAN: Geradine Girt, DO  Chief Complaint: Left Hip pain / fall  HPI: Ricardo Le is a 78 y.o. male who complains of left hip pain and difficulty with weightbearing after he was tumbled down from a stepladder yesterday.  X-rays in the emergency department showed a nondisplaced left acetabular fracture.  CT images confirmed this.  He has pain with motion of the hip but this is controlled.  He is otherwise active and bears weight without assistance.  He regularly mows his 5 acre property.   Past Medical History:  Diagnosis Date   CAD (coronary artery disease)    a. 04/2007 s/p CABG x 4  - LIMA->LAD, VG->OM1->Om2, VG->PDA;  b. 12/2008 & 06/2010 Caths - Native multivessel dzs with 4/4 patent grafts. c. cath 03/09/2015 patent grafts, unchanged from prior cath.   Cancer [facility])    pancreatic  March 2011   Carotid bruit    a. 07/2010 U/S- 0-39% bilat ICA stenosis   Chest pain    Noncardiac probably related to reflux   DDD (degenerative disc disease)    several surgeries   GERD (gastroesophageal reflux disease)    HNP (herniated nucleus pulposus), lumbar    a. L2-3, s/p laminotomy, microdiskectomy 02/2009   HOH (hard of hearing)    uses amplifiers    Nephrolithiasis    PAD (peripheral artery disease) ([place]) 04/2014   s/p L Common Iliac Stent   Palpitations    Pancreatic mass    a. 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   Pneumonia    hx of  pna   Pure hypercholesterolemia    statin intolerance   Type II or unspecified type diabetes mellitus without mention of complication, not stated as uncontrolled    insulin dependent   Unspecified essential hypertension    Past Surgical History:  Procedure Laterality Date   ABDOMINAL AORTAGRAM N/A 05/13/2014   Procedure: ABDOMINAL [name];  Surgeon: Wellington Hampshire, MD;  Location: [place] CATH LAB;  Service: Cardiovascular;  Laterality: N/A;   ANTERIOR CERVICAL  DECOMP/DISCECTOMY FUSION N/A 07/28/2014   Procedure: ANTERIOR CERVICAL DECOMPRESSION/DISCECTOMY FUSION CERVICAL FOUR-FIVE CERVICAL FIVE-SIX ;  Surgeon: Earnie Larsson, MD;  Location: [place] NEURO ORS;  Service: Neurosurgery;  Laterality: N/A;   APPENDECTOMY     arthroscopy right knee     BACK SURGERY     CARDIAC CATHETERIZATION N/A 03/09/2015   Procedure: Left Heart Cath and Cors/Grafts Angiography;  Surgeon: [name] Sine, MD;  Location: [place] CV LAB;  Service: Cardiovascular;  Laterality: N/A;   CHOLECYSTECTOMY     CORONARY ARTERY BYPASS GRAFT     X 4 2009 (LIMA to LAD, SVG to first cicumflex marginal branch with sequential SVG to second cicumflex marginal branch  and saphenous vein to posterior descending coronary artery with endoscopic,vein harvest rt. lower exttremity by Dr.Owen on March 31,2009   CYST REMOVED     FROM SPINE   CYSTOURETHROSCOPY     right retrograde pyelogram,manipulate stone in the renal pelvis, rt. double-j catheter.   EYE SURGERY     KNEE SURGERY     rt knee   LEFT HEART CATH AND CORS/GRAFTS ANGIOGRAPHY N/A 02/20/2017   Procedure: LEFT HEART CATH AND CORS/GRAFTS ANGIOGRAPHY;  Surgeon: Leonie Man, MD;  Location: [place] CV LAB;  Service: Cardiovascular;  Laterality: N/A;   LEFT HEART CATH AND CORS/GRAFTS ANGIOGRAPHY N/A 05/28/2018   Procedure: LEFT HEART CATH AND CORS/GRAFTS ANGIOGRAPHY;  Surgeon:  Burnell Blanks, MD;  Location: [place] CV LAB;  Service: Cardiovascular;  Laterality: N/A;   NEPHROLITHOTOMY     PERCUTANEOUS   PARS PLANA VITRECTOMY     lt. eye,retinal photocoagulation lt. eye, membrane peel lt. eye.   PERCUTANEOUS STENT INTERVENTION Left 05/13/2014   Procedure: PERCUTANEOUS STENT INTERVENTION;  Surgeon: Wellington Hampshire, MD;  Location: [place] CATH LAB;  Service: Cardiovascular;  Laterality: Left;  COMMON ILLIAC   re-exploratin of laminectomy  05/2008   (RT L2-3) WITH REDO MICRODISKECTOMY   Social History   Socioeconomic  History   Marital status: Married    Spouse name: Not on file   Number of children: Not on file   Years of education: Not on file   Highest education level: Not on file  Occupational History   Not on file  Social Needs   Financial resource strain: Not on file   Food insecurity:    Worry: Not on file    Inability: Not on file   Transportation needs:    Medical: Not on file    Non-medical: Not on file  Tobacco Use   Smoking status: Never Smoker   Smokeless tobacco: Never Used  Substance and Sexual Activity   Alcohol use: No   Drug use: No   Sexual activity: Not Currently  Lifestyle   Physical activity:    Days per week: Not on file    Minutes per session: Not on file   Stress: Not on file  Relationships   Social connections:    Talks on phone: Not on file    Gets together: Not on file    Attends religious service: Not on file    Active member of club or organization: Not on file    Attends meetings of clubs or organizations: Not on file    Relationship status: Not on file  Other Topics Concern   Not on file  Social History Narrative   Lives in [place] with wife.  Retired Company secretary.  Walks 1.10m daily.  Does not work 2/2 back problems.   Family History  Problem Relation Age of Onset   Heart attack Father        died of MI @ 552  Diabetes Brother        type 2   Other Mother        died @ 943  Coronary artery disease Sister        alive   Other Other        no fh of colon cancer   Coronary artery disease Brother        s/p CABG.  Alive @ 81   Allergies  Allergen Reactions   Rosuvastatin Calcium Other (See Comments)    Myalgias   Iohexol Hives    pt ok with premedication (Benadryl '50mg'$  PO), Onset Date: [id number]   Statins Other (See Comments)    Myalgias    Prior to Admission medications   Medication Sig Start Date End Date Taking? Authorizing Provider  acetaminophen (TYLENOL 8 HOUR ARTHRITIS PAIN) 650 MG CR tablet Take 650 mg  by mouth every 8 (eight) hours as needed for pain (arthritis).    [provider]  acetaminophen (TYLENOL) 325 MG tablet Take 650 mg by mouth every 6 (six) hours as needed (pain).    [provider]  amLODipine (NORVASC) 10 MG tablet Take 10 mg by mouth daily. 08/16/16   [provider]  aspirin EC 81 MG tablet  Take 81 mg by mouth at bedtime.    [provider]  atorvastatin (LIPITOR) 20 MG tablet Take 20 mg by mouth daily.  04/14/18   [provider]  esomeprazole (NEXIUM) 40 MG capsule Take 40 mg by mouth daily. 06/18/18   [provider]  gabapentin (NEURONTIN) 100 MG capsule Take 100 mg by mouth 2 (two) times daily.    [provider]  glipiZIDE (GLUCOTROL) 10 MG tablet Take 10 mg by mouth 2 (two) times daily.     [provider]  insulin glargine (LANTUS SOLOSTAR) 100 UNIT/ML injection Inject 15 Units into the skin at bedtime.     [provider]  lisinopril-hydrochlorothiazide (PRINZIDE,ZESTORETIC) 20-12.5 MG per tablet Take 1 tablet by mouth daily.      [provider]  Multiple Vitamin (MULTIVITAMIN WITH MINERALS) TABS tablet Take 1 tablet by mouth daily.    [provider]  nitroGLYCERIN (NITROSTAT) 0.4 MG SL tablet Place 1 tablet (0.4 mg total) under the tongue every 5 (five) minutes as needed for chest pain. 05/02/18 05/01/19  Josue Hector, MD  ranolazine (RANEXA) 500 MG 12 hr tablet Take 1 tablet (500 mg total) by mouth 2 (two) times daily. 06/10/18   Burtis Junes, NP  tamsulosin (FLOMAX) 0.4 MG CAPS capsule Take 0.4 mg by mouth daily after supper.    [provider]   Dg Chest 1 View  Result Date: 07/08/2018 CLINICAL DATA:  Fall from ladder with chest pain, initial encounter EXAM: CHEST  1 VIEW COMPARISON:  07/04/2018 FINDINGS: Cardiac shadow is within normal limits. Postsurgical changes. The lungs are well aerated with mild left basilar atelectasis. No focal infiltrate is seen. No  acute bony is noted. IMPRESSION: Mild left basilar atelectasis. Electronically Signed   By: Inez Catalina M.D.   On: 07/08/2018 21:02   Ct Angio Chest Pe W And/or Wo Contrast  Result Date: 07/09/2018 CLINICAL DATA:  Initial evaluation for acute chest pain, abdominal pain. EXAM: CT ANGIOGRAPHY CHEST CT ABDOMEN AND PELVIS WITH CONTRAST TECHNIQUE: Multidetector CT imaging of the chest was performed using the standard protocol during bolus administration of intravenous contrast. Multiplanar CT image reconstructions and MIPs were obtained to evaluate the vascular anatomy. Multidetector CT imaging of the abdomen and pelvis was performed using the standard protocol during bolus administration of intravenous contrast. CONTRAST:  137m OMNIPAQUE IOHEXOL 350 MG/ML SOLN COMPARISON:  Prior CT from 03/01/2018 FINDINGS: CTA CHEST FINDINGS Cardiovascular: Intrathoracic aorta of normal caliber without aneurysm or other acute abnormality. Moderate aortic atherosclerosis. Visualized great vessels tortuous but within normal limits. Mild cardiomegaly no pericardial effusion. Pulmonary arterial tree adequately opacified for evaluation. Main pulmonary artery dilated up to 3.4 cm, suggesting underlying pulmonary hypertension. No filling defect to suggest acute pulmonary embolism. Re-formatted imaging confirms these findings. Mediastinum/Nodes: Visualized thyroid within normal limits. No enlarged mediastinal, hilar, or axillary lymph nodes. Esophagus within normal limits. Lungs/Pleura: Tracheobronchial tree intact and patent. Diffuse central airway thickening noted. Lungs hypoinflated bilaterally with elevation of the hemidiaphragms. Trace layering bilateral pleural effusions, left slightly larger than right. Associated bibasilar atelectasis. No other focal infiltrates. No pulmonary edema. No pneumothorax. Few scattered subpleural subcentimeter nodular densities measuring up to 5 mm noted within the peripheral upper lobes bilaterally,  indeterminate. Musculoskeletal: Median sternotomy wires noted. No acute osseous abnormality. No worrisome lytic or blastic osseous lesions. Review of the MIP images confirms the above findings. CT ABDOMEN and PELVIS FINDINGS Hepatobiliary: Liver demonstrates a normal contrast enhanced appearance. Punctate granuloma noted  within the caudate lobe. Gallbladder surgically absent. No biliary dilatation. Pancreas: Sequelae of prior distal pancreatectomy noted. Pancreas otherwise unremarkable. Spleen: Spleen is absent. Adrenals/Urinary Tract: Adrenal glands within normal limits. Kidneys equal in size with symmetric enhancement. Focal cortical thinning and scarring noted at the lower pole the right kidney. 6 mm nonobstructive stone present at the lower pole the right kidney. No other radiopaque calculi. No hydronephrosis or hydroureter. No focal enhancing renal mass. Partially distended bladder within normal limits. Stomach/Bowel: Stomach decompressed without acute finding. No evidence for bowel obstruction. Large volume retained stool throughout the colon, suggesting constipation. Negative appendix. No acute inflammatory changes seen about the bowels. Vascular/Lymphatic: Advanced aorto bi-iliac atherosclerotic disease. No aneurysm. Mesenteric vessels patent proximally. Two right-sided renal arteries noted. Retroaortic left renal vein noted as well. No adenopathy. Reproductive: Enlarged prostate measuring 6.3 cm in transverse diameter. Other: No free air or fluid. Musculoskeletal: Additional views of the left hip demonstrate an acute nondisplaced fracture through the superior and posterior aspect of the left acetabulum (series 6, image 25). Subtle medial extension towards the iliopectineal line (series 10, image 42). Anterior column intact. Left femoral head remains normally positioned and remains intact. Femoral neck and visualized proximal femur intact. Remainder of the visualized pelvis intact. No pubic diastasis. SI  joints approximated. No other acute osseous abnormality. No discrete lytic or blastic osseous lesions. Patient status post posterior decompression at L2 through L4. Moderate to advanced multilevel degenerative spondylolysis throughout the lumbar spine. Review of the MIP images confirms the above findings. IMPRESSION: CTA CHEST IMPRESSION 1. No CT evidence for acute pulmonary embolism. 2. Low lung volumes with associated trace layering bilateral pleural effusions and bibasilar atelectasis. No other active cardiopulmonary disease. 3. Central airway thickening with dilatation of the main pulmonary artery up to 3.4 cm, suggesting a degree of underlying pulmonary hypertension. CT ABDOMEN AND PELVIS IMPRESSION 1. Acute nondisplaced left acetabular fracture as above. 2. No other acute abnormality within the abdomen and pelvis. 3. 6 mm nonobstructive right renal nephrolithiasis. 4. Large volume retained stool throughout the colon, suggesting constipation. 5. Advanced atherosclerosis with diffuse 3 vessel coronary artery calcifications. Electronically Signed   By: Jeannine Boga M.D.   On: 07/09/2018 03:35   Ct Abdomen Pelvis W Contrast  Result Date: 07/09/2018 CLINICAL DATA:  Initial evaluation for acute chest pain, abdominal pain. EXAM: CT ANGIOGRAPHY CHEST CT ABDOMEN AND PELVIS WITH CONTRAST TECHNIQUE: Multidetector CT imaging of the chest was performed using the standard protocol during bolus administration of intravenous contrast. Multiplanar CT image reconstructions and MIPs were obtained to evaluate the vascular anatomy. Multidetector CT imaging of the abdomen and pelvis was performed using the standard protocol during bolus administration of intravenous contrast. CONTRAST:  119m OMNIPAQUE IOHEXOL 350 MG/ML SOLN COMPARISON:  Prior CT from 03/01/2018 FINDINGS: CTA CHEST FINDINGS Cardiovascular: Intrathoracic aorta of normal caliber without aneurysm or other acute abnormality. Moderate aortic atherosclerosis.  Visualized great vessels tortuous but within normal limits. Mild cardiomegaly no pericardial effusion. Pulmonary arterial tree adequately opacified for evaluation. Main pulmonary artery dilated up to 3.4 cm, suggesting underlying pulmonary hypertension. No filling defect to suggest acute pulmonary embolism. Re-formatted imaging confirms these findings. Mediastinum/Nodes: Visualized thyroid within normal limits. No enlarged mediastinal, hilar, or axillary lymph nodes. Esophagus within normal limits. Lungs/Pleura: Tracheobronchial tree intact and patent. Diffuse central airway thickening noted. Lungs hypoinflated bilaterally with elevation of the hemidiaphragms. Trace layering bilateral pleural effusions, left slightly larger than right. Associated bibasilar atelectasis. No other focal infiltrates. No pulmonary edema. No  pneumothorax. Few scattered subpleural subcentimeter nodular densities measuring up to 5 mm noted within the peripheral upper lobes bilaterally, indeterminate. Musculoskeletal: Median sternotomy wires noted. No acute osseous abnormality. No worrisome lytic or blastic osseous lesions. Review of the MIP images confirms the above findings. CT ABDOMEN and PELVIS FINDINGS Hepatobiliary: Liver demonstrates a normal contrast enhanced appearance. Punctate granuloma noted within the caudate lobe. Gallbladder surgically absent. No biliary dilatation. Pancreas: Sequelae of prior distal pancreatectomy noted. Pancreas otherwise unremarkable. Spleen: Spleen is absent. Adrenals/Urinary Tract: Adrenal glands within normal limits. Kidneys equal in size with symmetric enhancement. Focal cortical thinning and scarring noted at the lower pole the right kidney. 6 mm nonobstructive stone present at the lower pole the right kidney. No other radiopaque calculi. No hydronephrosis or hydroureter. No focal enhancing renal mass. Partially distended bladder within normal limits. Stomach/Bowel: Stomach decompressed without acute  finding. No evidence for bowel obstruction. Large volume retained stool throughout the colon, suggesting constipation. Negative appendix. No acute inflammatory changes seen about the bowels. Vascular/Lymphatic: Advanced aorto bi-iliac atherosclerotic disease. No aneurysm. Mesenteric vessels patent proximally. Two right-sided renal arteries noted. Retroaortic left renal vein noted as well. No adenopathy. Reproductive: Enlarged prostate measuring 6.3 cm in transverse diameter. Other: No free air or fluid. Musculoskeletal: Additional views of the left hip demonstrate an acute nondisplaced fracture through the superior and posterior aspect of the left acetabulum (series 6, image 25). Subtle medial extension towards the iliopectineal line (series 10, image 42). Anterior column intact. Left femoral head remains normally positioned and remains intact. Femoral neck and visualized proximal femur intact. Remainder of the visualized pelvis intact. No pubic diastasis. SI joints approximated. No other acute osseous abnormality. No discrete lytic or blastic osseous lesions. Patient status post posterior decompression at L2 through L4. Moderate to advanced multilevel degenerative spondylolysis throughout the lumbar spine. Review of the MIP images confirms the above findings. IMPRESSION: CTA CHEST IMPRESSION 1. No CT evidence for acute pulmonary embolism. 2. Low lung volumes with associated trace layering bilateral pleural effusions and bibasilar atelectasis. No other active cardiopulmonary disease. 3. Central airway thickening with dilatation of the main pulmonary artery up to 3.4 cm, suggesting a degree of underlying pulmonary hypertension. CT ABDOMEN AND PELVIS IMPRESSION 1. Acute nondisplaced left acetabular fracture as above. 2. No other acute abnormality within the abdomen and pelvis. 3. 6 mm nonobstructive right renal nephrolithiasis. 4. Large volume retained stool throughout the colon, suggesting constipation. 5. Advanced  atherosclerosis with diffuse 3 vessel coronary artery calcifications. Electronically Signed   By: Jeannine Boga M.D.   On: 07/09/2018 03:35   Ct No Charge  Result Date: 07/09/2018 CLINICAL DATA:  Initial evaluation for acute chest pain, abdominal pain. EXAM: CT ANGIOGRAPHY CHEST CT ABDOMEN AND PELVIS WITH CONTRAST TECHNIQUE: Multidetector CT imaging of the chest was performed using the standard protocol during bolus administration of intravenous contrast. Multiplanar CT image reconstructions and MIPs were obtained to evaluate the vascular anatomy. Multidetector CT imaging of the abdomen and pelvis was performed using the standard protocol during bolus administration of intravenous contrast. CONTRAST:  155m OMNIPAQUE IOHEXOL 350 MG/ML SOLN COMPARISON:  Prior CT from 03/01/2018 FINDINGS: CTA CHEST FINDINGS Cardiovascular: Intrathoracic aorta of normal caliber without aneurysm or other acute abnormality. Moderate aortic atherosclerosis. Visualized great vessels tortuous but within normal limits. Mild cardiomegaly no pericardial effusion. Pulmonary arterial tree adequately opacified for evaluation. Main pulmonary artery dilated up to 3.4 cm, suggesting underlying pulmonary hypertension. No filling defect to suggest acute pulmonary embolism. Re-formatted imaging confirms these  findings. Mediastinum/Nodes: Visualized thyroid within normal limits. No enlarged mediastinal, hilar, or axillary lymph nodes. Esophagus within normal limits. Lungs/Pleura: Tracheobronchial tree intact and patent. Diffuse central airway thickening noted. Lungs hypoinflated bilaterally with elevation of the hemidiaphragms. Trace layering bilateral pleural effusions, left slightly larger than right. Associated bibasilar atelectasis. No other focal infiltrates. No pulmonary edema. No pneumothorax. Few scattered subpleural subcentimeter nodular densities measuring up to 5 mm noted within the peripheral upper lobes bilaterally,  indeterminate. Musculoskeletal: Median sternotomy wires noted. No acute osseous abnormality. No worrisome lytic or blastic osseous lesions. Review of the MIP images confirms the above findings. CT ABDOMEN and PELVIS FINDINGS Hepatobiliary: Liver demonstrates a normal contrast enhanced appearance. Punctate granuloma noted within the caudate lobe. Gallbladder surgically absent. No biliary dilatation. Pancreas: Sequelae of prior distal pancreatectomy noted. Pancreas otherwise unremarkable. Spleen: Spleen is absent. Adrenals/Urinary Tract: Adrenal glands within normal limits. Kidneys equal in size with symmetric enhancement. Focal cortical thinning and scarring noted at the lower pole the right kidney. 6 mm nonobstructive stone present at the lower pole the right kidney. No other radiopaque calculi. No hydronephrosis or hydroureter. No focal enhancing renal mass. Partially distended bladder within normal limits. Stomach/Bowel: Stomach decompressed without acute finding. No evidence for bowel obstruction. Large volume retained stool throughout the colon, suggesting constipation. Negative appendix. No acute inflammatory changes seen about the bowels. Vascular/Lymphatic: Advanced aorto bi-iliac atherosclerotic disease. No aneurysm. Mesenteric vessels patent proximally. Two right-sided renal arteries noted. Retroaortic left renal vein noted as well. No adenopathy. Reproductive: Enlarged prostate measuring 6.3 cm in transverse diameter. Other: No free air or fluid. Musculoskeletal: Additional views of the left hip demonstrate an acute nondisplaced fracture through the superior and posterior aspect of the left acetabulum (series 6, image 25). Subtle medial extension towards the iliopectineal line (series 10, image 42). Anterior column intact. Left femoral head remains normally positioned and remains intact. Femoral neck and visualized proximal femur intact. Remainder of the visualized pelvis intact. No pubic diastasis. SI  joints approximated. No other acute osseous abnormality. No discrete lytic or blastic osseous lesions. Patient status post posterior decompression at L2 through L4. Moderate to advanced multilevel degenerative spondylolysis throughout the lumbar spine. Review of the MIP images confirms the above findings. IMPRESSION: CTA CHEST IMPRESSION 1. No CT evidence for acute pulmonary embolism. 2. Low lung volumes with associated trace layering bilateral pleural effusions and bibasilar atelectasis. No other active cardiopulmonary disease. 3. Central airway thickening with dilatation of the main pulmonary artery up to 3.4 cm, suggesting a degree of underlying pulmonary hypertension. CT ABDOMEN AND PELVIS IMPRESSION 1. Acute nondisplaced left acetabular fracture as above. 2. No other acute abnormality within the abdomen and pelvis. 3. 6 mm nonobstructive right renal nephrolithiasis. 4. Large volume retained stool throughout the colon, suggesting constipation. 5. Advanced atherosclerosis with diffuse 3 vessel coronary artery calcifications. Electronically Signed   By: Jeannine Boga M.D.   On: 07/09/2018 03:35   Dg Hip Unilat W Or Wo Pelvis 2-3 Views Left  Result Date: 07/08/2018 CLINICAL DATA:  Recent fall from ladder with left hip pain, initial encounter EXAM: DG HIP (WITH OR WITHOUT PELVIS) 3V LEFT COMPARISON:  05/19/2014 FINDINGS: Pelvic ring is intact. Degenerative changes of lumbar spine and hip joints are noted. No acute fractures noted. IMPRESSION: No fracture identified. Electronically Signed   By: Inez Catalina M.D.   On: 07/08/2018 21:03    Positive ROS: All other systems have been reviewed and were otherwise negative with the exception of those mentioned in  the HPI and as above.  Objective: Labs cbc Recent Labs    07/08/18 1829 07/08/18 2215  WBC 17.9*  --   HGB 14.5 14.3  HCT 43.6 42.0  PLT 223  --     Labs inflam No results for input(s): CRP in the last 72 hours.  Invalid input(s):  ESR  Labs coag No results for input(s): INR, PTT in the last 72 hours.  Invalid input(s): PT  Recent Labs    07/08/18 1829 07/08/18 2215  NA 134* 134*  K 4.2 3.8  CL 99  --   CO2 21*  --   GLUCOSE 189*  --   BUN 27*  --   CREATININE 1.04  --   CALCIUM 9.5  --     Physical Exam: Vitals:   07/09/18 0528 07/09/18 0723  BP: (!) 171/69 (!) 143/67  Pulse: 81 77  Resp: 16 15  Temp: 98.1 F (36.7 C) 97.7 F (36.5 C)  SpO2: 96% 97%   General: Alert, no acute distress.  Supine in bed.  Calm, conversant. Mental status: Alert and Oriented x3 Neurologic: Speech Clear and organized, no gross focal findings or movement disorder appreciated. Respiratory: No cyanosis, no use of accessory musculature Cardiovascular: No pedal edema GI: Abdomen is soft and non-tender, non-distended. Skin: Warm and dry.  No lesions in the area of chief complaint. Extremities: Warm and well perfused w/o edema Psychiatric: Patient is competent for consent with normal mood and affect  MUSCULOSKELETAL:  LLE: Pain with range of motion in the hip.  Neurovascular intact distally.  Feet warm. Other extremities are atraumatic with painless ROM and NVI.  Assessment / Plan: Principal Problem:   Fracture of left acetabulum (HCC) Active Problems:   Type 2 diabetes mellitus with vascular disease (HCC)   Essential hypertension   Constipation   S/P CABG x 4   Coronary artery disease involving native coronary artery of native heart with unstable angina pectoris (HCC)   Non displaced Left Acetabular Fracture Non-operative management Mobilize w/ therapy TDWB LLE   Follow up in the office with Dr. Alain Marion in 2 weeks.  Please call with questions.   Contact information:  Edmonia Lynch MD, Roxan Hockey PA-C  Prudencio Burly III PA-C 07/09/2018 7:24 AM

## 2018-07-09 NOTE — Evaluation (Signed)
Physical Therapy Evaluation Patient Details Name: Ricardo Le MRN: [id number] DOB: Jun 13, 1940 Today's Date: 07/09/2018   History of Present Illness  78 yo admitted after fall off step stool with hip fx non operative management. PMhx: DM, HTN, CABG, HOH, CAD  Clinical Impression  Pt pleasantly confused on arrival asking who will perform his surgery. Pt normally independent, active and cognitively intact per wife with reaction to morphine which makes him confused. Pt currently pleasant but with decreased safety, function, transfers and strength who will benefit from acute therapy to maximize mobility, gait, safety and function to decrease burden of care. Pt would benefit from CIR to be able to return home given current change in cognition and function.  HR 81    Follow Up Recommendations CIR;Supervision/Assistance - 24 hour    Equipment Recommendations  Rolling walker with 5" wheels;Wheelchair (measurements PT);3in1 (PT)    Recommendations for Other Services Rehab consult;OT consult     Precautions / Restrictions Precautions Precautions: Fall Restrictions LLE Weight Bearing: Touchdown weight bearing      Mobility  Bed Mobility Overal bed mobility: Needs Assistance Bed Mobility: Supine to Sit     Supine to sit: Min assist;HOB elevated     General bed mobility comments: HOb 15 degrees with min assist to pivot to right side of bed with increased time and assist to move LLE   Transfers Overall transfer level: Needs assistance   Transfers: Sit to/from Stand;Stand Pivot Transfers Sit to Stand: Min assist Stand pivot transfers: Mod assist       General transfer comment: min assist to rise from bed with cues for hand placement and therapist foot under pt Left foot to prevent weight bearing. Mod assist with RW to pivot bed to chair with RW with sequential cues  Ambulation/Gait             General Gait Details: not yet able  Stairs            Wheelchair  Mobility    Modified Rankin (Stroke Patients Only)       Balance Overall balance assessment: Needs assistance;History of Falls   Sitting balance-Leahy Scale: Good     Standing balance support: Bilateral upper extremity supported Standing balance-Leahy Scale: Poor                               Pertinent Vitals/Pain Pain Assessment: 0-10 Pain Score: 4  Pain Location: sore left hip with movement, no pain at rest Pain Descriptors / Indicators: Aching;Discomfort Pain Intervention(s): Limited activity within patient's tolerance;Monitored during session;Repositioned    Home Living Family/patient expects to be discharged to:: Private residence Living Arrangements: Spouse/significant other Available Help at Discharge: Family;Available 24 hours/day Type of Home: House Home Access: Stairs to enter   [employer] of Steps: 1 Home Layout: One level Home Equipment: Bedside commode;Walker - 2 wheels      Prior Function Level of Independence: Independent               Hand Dominance        Extremity/Trunk Assessment   Upper Extremity Assessment Upper Extremity Assessment: Overall WFL for tasks assessed    Lower Extremity Assessment Lower Extremity Assessment: LLE deficits/detail LLE Deficits / Details: decreased strength and ROM due to pain LLE: Unable to fully assess due to pain    Cervical / Trunk Assessment Cervical / Trunk Assessment: Normal  Communication   Communication: No difficulties  Cognition Arousal/Alertness:  Awake/alert;Suspect due to medications Behavior During Therapy: [facility] for tasks assessed/performed Overall Cognitive Status: Impaired/Different from baseline Area of Impairment: Orientation;Attention;Memory;Problem solving;Safety/judgement;Following commands                 Orientation Level: Disoriented to;Time;Situation Current Attention Level: Sustained Memory: Decreased short-term memory Following Commands:  Follows one step commands with increased time Safety/Judgement: Decreased awareness of safety;Decreased awareness of deficits   Problem Solving: Slow processing;Difficulty sequencing General Comments: pt continues to state he is having surgery despite correction during session. Pt with decreased memory and problem solving. Wife on phone during session stating morphine make pt confused      General Comments      Exercises General Exercises - Lower Extremity Long Arc Quad: AROM;10 reps;Seated;Left Heel Slides: AAROM;10 reps;Supine;Left   Assessment/Plan    PT Assessment Patient needs continued PT services  PT Problem List Decreased strength;Decreased balance;Decreased cognition;Decreased activity tolerance;Decreased mobility;Decreased knowledge of precautions;Pain;Decreased knowledge of use of DME;Decreased safety awareness       PT Treatment Interventions DME instruction;Functional mobility training;Balance training;Patient/family education;Gait training;Therapeutic activities;Stair training;Therapeutic exercise    PT Goals (Current goals can be found in the Care Plan section)  Acute Rehab PT Goals Patient 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 PT Goal Formulation: With patient Time For Goal Achievement: 07/23/18 Potential to Achieve Goals: Good    Frequency Min 5X/week   Barriers to discharge Decreased caregiver support wife and daughter at home but wife cannot physically assist    Co-evaluation               AM-PAC PT "6 Clicks" Mobility  Outcome Measure Help needed turning from your back to your side while in a flat bed without using bedrails?: A Little Help needed moving from lying on your back to sitting on the side of a flat bed without using bedrails?: A Little Help needed moving to and from a bed to a chair (including a wheelchair)?: A Lot Help needed standing up from a chair using your arms (e.g., wheelchair or bedside chair)?: A Little Help needed to walk in  hospital room?: A Lot Help needed climbing 3-5 steps with a railing? : Total 6 Click Score: 14    End of Session Equipment Utilized During Treatment: Gait belt Activity Tolerance: Patient tolerated treatment well Patient left: in chair;with call bell/phone within reach;with nursing/sitter in room;with chair alarm set Nurse Communication: Mobility status;Weight bearing status PT Visit Diagnosis: Unsteadiness on feet (R26.81);Muscle weakness (generalized) (M62.81);Other abnormalities of gait and mobility (R26.89);History of falling (Z91.81);Difficulty in walking, not elsewhere classified (R26.2)    Time: 3704-8889 PT Time Calculation (min) (ACUTE ONLY): 21 min   Charges:   PT Evaluation $PT Eval Moderate Complexity: 1 Mod          [place], PT Acute Rehabilitation Services Pager: [phone] Office: [phone]   Sandy Salaam Vonetta Foulk 07/09/2018, 12:33 PM

## 2018-07-09 NOTE — Progress Notes (Signed)
Rehab Admissions Coordinator Note:  Patient was screened by Michel Santee, PT, DPT for appropriateness for an Inpatient Acute Rehab Consult.  At this time, we are recommending Inpatient Rehab consult.  I will contact MD for order.  Shann Medal, PT, DPT Admissions Coordinator [phone] 07/09/18  12:34 PM

## 2018-07-09 NOTE — Consult Note (Signed)
Cardiology Consultation:   Patient ID: Ricardo Le; [id number]; 02/27/1940   Admit date: 07/08/2018 Date of Consult: 07/09/2018  Primary Care Provider: Angelina Sheriff, MD Primary Cardiologist: Jenkins Rouge, MD   Patient Profile:   Ricardo Le is a 78 y.o. male with a hx of CAD s/p CABG in 2009, PAD followed by Dr [name], HTN, HLD on statin and pancreatic cancer who is being seen today for the evaluation of elevated troponin chest pain at the request of Dr. Eliseo Squires.  History of Present Illness:   Ricardo Le is a 78yo with a hx as stated above who presented to [facility] on 07/09/2018 with a fracture to his left acetabulum after falling off a stepladder. EMS was called for transport to the ED for further evaluation. While in the ED, it was reported that the patient developed chest pain in which troponin level was drawn. The patient cannot discern if he had chest pain or not. He is oriented to person and time, however seems mildly confused to place. Initially, this was negative at <0.03, then began to uptrend at 0.03>0.13>0.28.  EKG performed which shows Q waves in inferior leads and no acute changes, similar to prior tracing from 05/28/2018. CTA was performed which showed no PE, low lung volumes with trace bilateral pleural effusions and bibasilar atelectasis with similar findings on CXR.  Imaging confirmed nondisplaced acetabular fracture in which surgical team consulted. Given troponin elevation, heparin gtt was initiated as well as his home ASA and statin. He is currently chest pain free.   Of note, Ricardo Le was last seen in the office as an acute visit on 07/04/2018 for worsening dyspnea for approximately 2 months with orthopnea and PND. Pt had a telehealth visit (telephone) with Dr. Johnsie Cancel earlier that same day and endorsed new dyspnea concerning for CHF. Dr. Johnsie Cancel ordered a CXR, BNP/ BMP and CBC and recommended an office visit for exam and further evaluation. During this visit, he was  not fluid volume overloaded. CXR was completed that showed normal pulmonary vascularity with persistent low lung volumes and no focal consolidation, pleural effusion or pneumothorax. EKG showed normal sinus rhythm with left axis deviation. He also had been having issues with chronic chest pain. Myovue completed 05/16/2018 was non ischemic with EF 78%. Despite Myoview, he has had persistent pain and was referred for definative cath which was done 05/28/2018. Patient's grafts and diffuse disease distal to grafts not amenable to intervention with no change from prior cath 02/20/17. Medical Rx recommended. He had f/u with Anola Gurney 06/10/18 and was restarted on Ranexa. He reports that since starting Ranexa, his chronic chest pain symptoms are more controlled.   Past Medical History:  Diagnosis Date   CAD (coronary artery disease)    a. 04/2007 s/p CABG x 4  - LIMA->LAD, VG->OM1->Om2, VG->PDA;  b. 12/2008 & 06/2010 Caths - Native multivessel dzs with 4/4 patent grafts. c. cath 03/09/2015 patent grafts, unchanged from prior cath.   Cancer [facility])    pancreatic  March 2011   Carotid bruit    a. 07/2010 U/S- 0-39% bilat ICA stenosis   Chest pain    Noncardiac probably related to reflux   DDD (degenerative disc disease)    several surgeries   GERD (gastroesophageal reflux disease)    HNP (herniated nucleus pulposus), lumbar    a. L2-3, s/p laminotomy, microdiskectomy 02/2009   HOH (hard of hearing)    uses amplifiers    Nephrolithiasis    PAD (  peripheral artery disease) ([place]) 04/2014   s/p L Common Iliac Stent   Palpitations    Pancreatic mass    a. 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   Pneumonia    hx of  pna   Pure hypercholesterolemia    statin intolerance   Type II or unspecified type diabetes mellitus without mention of complication, not stated as uncontrolled    insulin dependent   Unspecified essential hypertension     Past Surgical History:  Procedure  Laterality Date   ABDOMINAL AORTAGRAM N/A 05/13/2014   Procedure: ABDOMINAL [name];  Surgeon: Wellington Hampshire, MD;  Location: [place] CATH LAB;  Service: Cardiovascular;  Laterality: N/A;   ANTERIOR CERVICAL DECOMP/DISCECTOMY FUSION N/A 07/28/2014   Procedure: ANTERIOR CERVICAL DECOMPRESSION/DISCECTOMY FUSION CERVICAL FOUR-FIVE CERVICAL FIVE-SIX ;  Surgeon: Earnie Larsson, MD;  Location: [place] NEURO ORS;  Service: Neurosurgery;  Laterality: N/A;   APPENDECTOMY     arthroscopy right knee     BACK SURGERY     CARDIAC CATHETERIZATION N/A 03/09/2015   Procedure: Left Heart Cath and Cors/Grafts Angiography;  Surgeon: [name] Sine, MD;  Location: [place] CV LAB;  Service: Cardiovascular;  Laterality: N/A;   CHOLECYSTECTOMY     CORONARY ARTERY BYPASS GRAFT     X 4 2009 (LIMA to LAD, SVG to first cicumflex marginal branch with sequential SVG to second cicumflex marginal branch  and saphenous vein to posterior descending coronary artery with endoscopic,vein harvest rt. lower exttremity by Dr.Owen on March 31,2009   CYST REMOVED     FROM SPINE   CYSTOURETHROSCOPY     right retrograde pyelogram,manipulate stone in the renal pelvis, rt. double-j catheter.   EYE SURGERY     KNEE SURGERY     rt knee   LEFT HEART CATH AND CORS/GRAFTS ANGIOGRAPHY N/A 02/20/2017   Procedure: LEFT HEART CATH AND CORS/GRAFTS ANGIOGRAPHY;  Surgeon: Leonie Man, MD;  Location: [place] CV LAB;  Service: Cardiovascular;  Laterality: N/A;   LEFT HEART CATH AND CORS/GRAFTS ANGIOGRAPHY N/A 05/28/2018   Procedure: LEFT HEART CATH AND CORS/GRAFTS ANGIOGRAPHY;  Surgeon: Burnell Blanks, MD;  Location: [place] CV LAB;  Service: Cardiovascular;  Laterality: N/A;   NEPHROLITHOTOMY     PERCUTANEOUS   PARS PLANA VITRECTOMY     lt. eye,retinal photocoagulation lt. eye, membrane peel lt. eye.   PERCUTANEOUS STENT INTERVENTION Left 05/13/2014   Procedure: PERCUTANEOUS STENT INTERVENTION;  Surgeon: Wellington Hampshire, MD;  Location: [place] CATH LAB;  Service: Cardiovascular;  Laterality: Left;  COMMON ILLIAC   re-exploratin of laminectomy  05/2008   (RT L2-3) WITH REDO MICRODISKECTOMY     Prior to Admission medications   Medication Sig Start Date End Date Taking? Authorizing Provider  acetaminophen (TYLENOL 8 HOUR ARTHRITIS PAIN) 650 MG CR tablet Take 650 mg by mouth every 8 (eight) hours as needed for pain (arthritis).    [provider]  acetaminophen (TYLENOL) 325 MG tablet Take 650 mg by mouth every 6 (six) hours as needed (pain).    [provider]  amLODipine (NORVASC) 10 MG tablet Take 10 mg by mouth daily. 08/16/16   [provider]  aspirin EC 81 MG tablet Take 81 mg by mouth at bedtime.    [provider]  atorvastatin (LIPITOR) 20 MG tablet Take 20 mg by mouth daily.  04/14/18   [provider]  esomeprazole (NEXIUM) 40 MG capsule Take 40 mg by mouth daily. 06/18/18   [provider]  gabapentin (NEURONTIN) 100 MG capsule Take 100 mg by mouth 2 (two) times daily.    [provider]  glipiZIDE (GLUCOTROL) 10 MG tablet Take 10 mg by mouth 2 (two) times daily.     [provider]  insulin glargine (LANTUS SOLOSTAR) 100 UNIT/ML injection Inject 15 Units into the skin at bedtime.     [provider]  lisinopril-hydrochlorothiazide (PRINZIDE,ZESTORETIC) 20-12.5 MG per tablet Take 1 tablet by mouth daily.      [provider]  Multiple Vitamin (MULTIVITAMIN WITH MINERALS) TABS tablet Take 1 tablet by mouth daily.    [provider]  nitroGLYCERIN (NITROSTAT) 0.4 MG SL tablet Place 1 tablet (0.4 mg total) under the tongue every 5 (five) minutes as needed for chest pain. 05/02/18 05/01/19  Josue Hector, MD  ranolazine (RANEXA) 500 MG 12 hr tablet Take 1 tablet (500 mg total) by mouth 2 (two) times daily. 06/10/18   Burtis Junes, NP  tamsulosin (FLOMAX) 0.4 MG CAPS capsule Take 0.4 mg by mouth daily after  supper.    [provider]    Inpatient Medications: Scheduled Meds:  amLODipine  10 mg Oral Daily   [START ON 07/10/2018] aspirin EC  81 mg Oral QHS   atorvastatin  20 mg Oral Daily   docusate sodium  100 mg Oral BID   gabapentin  100 mg Oral BID   lisinopril  20 mg Oral Daily   And   hydrochlorothiazide  12.5 mg Oral Daily   insulin aspart  0-9 Units Subcutaneous TID WC   insulin glargine  15 Units Subcutaneous QHS   multivitamin with minerals  1 tablet Oral Daily   pantoprazole  80 mg Oral Q1200   ranolazine  500 mg Oral BID   senna  1 tablet Oral Daily   tamsulosin  0.4 mg Oral QPC supper   Continuous Infusions:  heparin 850 Units/hr (07/09/18 0546)   PRN Meds: acetaminophen, iohexol, morphine injection, nitroGLYCERIN, ondansetron **OR** ondansetron (ZOFRAN) IV, sorbitol  Allergies:    Allergies  Allergen Reactions   Rosuvastatin Calcium Other (See Comments)    Myalgias   Iohexol Hives    pt ok with premedication (Benadryl 50mg  PO), Onset Date: [id number]    Statins Other (See Comments)    Myalgias     Social History:   Social History   Socioeconomic History   Marital status: Married    Spouse name: Not on file   Number of children: Not on file   Years of education: Not on file   Highest education level: Not on file  Occupational History   Not on file  Social Needs   Financial resource strain: Not on file   Food insecurity:    Worry: Not on file    Inability: Not on file   Transportation needs:    Medical: Not on file    Non-medical: Not on file  Tobacco Use   Smoking status: Never Smoker   Smokeless tobacco: Never Used  Substance and Sexual Activity   Alcohol use: No   Drug use: No   Sexual activity: Not Currently  Lifestyle   Physical activity:    Days per week: Not on file    Minutes per session: Not on file   Stress: Not on file  Relationships   Social connections:    Talks on phone: Not on file      Gets together: Not on file    Attends religious service: Not on file  Active member of club or organization: Not on file    Attends meetings of clubs or organizations: Not on file    Relationship status: Not on file   Intimate partner violence:    Fear of current or ex partner: Not on file    Emotionally abused: Not on file    Physically abused: Not on file    Forced sexual activity: Not on file  Other Topics Concern   Not on file  Social History Narrative   Lives in [place] with wife.  Retired Company secretary.  Walks 1.50mi daily.  Does not work 2/2 back problems.    Family History:   Family History  Problem Relation Age of Onset   Heart attack Father        died of MI @ 75   Diabetes Brother        type 2   Other Mother        died @ 27   Coronary artery disease Sister        alive   Other Other        no fh of colon cancer   Coronary artery disease Brother        s/p CABG.  Alive @ 60   Family Status:  Family Status  Relation Name Status   Father  Deceased   Brother  Alive   Mother  Deceased   Sister  Alive   Other  (Not Specified)   Brother  (Not Specified)    ROS:  Please see the history of present illness.  All other ROS reviewed and negative.     Physical Exam/Data:   Vitals:   07/09/18 0500 07/09/18 0528 07/09/18 0552 07/09/18 0723  BP:  (!) 171/69  (!) 143/67  Pulse:  81  77  Resp:  16  15  Temp:  98.1 F (36.7 C)  97.7 F (36.5 C)  TempSrc:  Oral  Oral  SpO2:  96%  97%  Weight: 72.9 kg  74.8 kg   Height: 5\' 5"  (1.651 m)  5\' 5"  (1.651 m)     Intake/Output Summary (Last 24 hours) at 07/09/2018 1118 Last data filed at 07/09/2018 1013 Gross per 24 hour  Intake 0 ml  Output 200 ml  Net -200 ml   Filed Weights   07/09/18 0500 07/09/18 0552  Weight: 72.9 kg 74.8 kg   Body mass index is 27.46 kg/m.   General: Well developed, well nourished, NAD Skin: Warm, dry, intact  Head: Normocephalic, atraumatic, clear, moist mucus  membranes. Neck: Negative for carotid bruits. No JVD Lungs: Diminished in bilateral upper lobes. No wheezes, rales, or rhonchi. Breathing is unlabored. Cardiovascular: RRR with S1 S2. No murmurs, rubs, gallops, or LV heave appreciated. Abdomen: Soft, non-tender, non-distended with normoactive bowel sounds. No obvious abdominal masses. Extremities: No edema. No clubbing or cyanosis. DP/PT pulses 2+ bilaterally Neuro: Alert and oriented to person and time. No focal deficits. No facial asymmetry. MAE spontaneously. Psych: Responds to questions appropriately with normal affect.     EKG:  The EKG was personally reviewed and demonstrates: 07/09/2018 NSR with Q waves in inferior leads, similar to prior tracings   Telemetry:  Telemetry was personally reviewed and demonstrates:  07/09/2018 NSR   Relevant CV Studies:  Stress test 05/16/2018:   The left ventricular ejection fraction is hyperdynamic (>65%).  Nuclear stress EF: 78%.  There was no ST segment deviation noted during stress.  The study is normal.   Normal pharmacologic nuclear stress  test with no evidence for a prior infarct or ischemia.  Cath 05/28/18:  1. Severe triple vessel CAD 2. Chronic occlusion proximal LAD. The mid and distal LAD fills from the patent LIMA graft to the mid LAD.  3. Chronic occlusion proximal Circumflex. The first and second obtuse marginal branches fill from the patent sequential vein graft 4. Chronic occlusion of the ostial RCA. The distal branches fill from the patent vein graft.  5. Normal LV systolic function.  6. The target vessels are diffusely diseased and unchanged grossly from last cath in 2019. No targets for PCI.   Recommendations: Continue medical management of CAD. Consider titration of anti-anginal therapy.  Laboratory Data:  Chemistry Recent Labs  Lab 07/04/18 1311 07/08/18 1829 07/08/18 2215  NA 138 134* 134*  K 4.7 4.2 3.8  CL 95* 99  --   CO2 27 21*  --   GLUCOSE 214*  189*  --   BUN 21 27*  --   CREATININE 0.89 1.04  --   CALCIUM 9.9 9.5  --   GFRNONAA 83 >60  --   GFRAA 96 >60  --   ANIONGAP  --  14  --     Total Protein  Date Value Ref Range Status  01/19/2018 8.4 (H) 6.5 - 8.1 g/dL Final   Albumin  Date Value Ref Range Status  01/19/2018 4.1 3.5 - 5.0 g/dL Final   AST  Date Value Ref Range Status  01/19/2018 47 (H) 15 - 41 U/L Final   ALT  Date Value Ref Range Status  01/19/2018 66 (H) 0 - 44 U/L Final   Alkaline Phosphatase  Date Value Ref Range Status  01/19/2018 56 38 - 126 U/L Final   Total Bilirubin  Date Value Ref Range Status  01/19/2018 1.5 (H) 0.3 - 1.2 mg/dL Final   Hematology Recent Labs  Lab 07/04/18 1311 07/08/18 1829 07/08/18 2215  WBC 9.6 17.9*  --   RBC 4.52 4.64  --   HGB 14.8 14.5 14.3  HCT 42.4 43.6 42.0  MCV 94 94.0  --   MCH 32.7 31.3  --   MCHC 34.9 33.3  --   RDW 12.5 12.7  --   PLT 279 223  --    Cardiac Enzymes Recent Labs  Lab 07/08/18 1829 07/08/18 2346 07/09/18 0332 07/09/18 0703  TROPONINI <0.03 0.03* 0.13* 0.28*   No results for input(s): TROPIPOC in the last 168 hours.  BNP Recent Labs  Lab 07/04/18 1311 07/08/18 1829  BNP  --  37.8  PROBNP 137  --     DDimer No results for input(s): DDIMER in the last 168 hours. TSH: No results found for: TSH Lipids: Lab Results  Component Value Date   CHOL 138 02/21/2017   HDL 43 02/21/2017   LDLCALC 86 02/21/2017   TRIG 44 02/21/2017   CHOLHDL 3.2 02/21/2017   HgbA1c: Lab Results  Component Value Date   HGBA1C 6.5 (H) 02/21/2017    Radiology/Studies:  Dg Chest 1 View  Result Date: 07/08/2018 CLINICAL DATA:  Fall from ladder with chest pain, initial encounter EXAM: CHEST  1 VIEW COMPARISON:  07/04/2018 FINDINGS: Cardiac shadow is within normal limits. Postsurgical changes. The lungs are well aerated with mild left basilar atelectasis. No focal infiltrate is seen. No acute bony is noted. IMPRESSION: Mild left basilar  atelectasis. Electronically Signed   By: Inez Catalina M.D.   On: 07/08/2018 21:02   Ct Angio Chest Pe W And/or Wo  Contrast  Result Date: 07/09/2018 CLINICAL DATA:  Initial evaluation for acute chest pain, abdominal pain. EXAM: CT ANGIOGRAPHY CHEST CT ABDOMEN AND PELVIS WITH CONTRAST TECHNIQUE: Multidetector CT imaging of the chest was performed using the standard protocol during bolus administration of intravenous contrast. Multiplanar CT image reconstructions and MIPs were obtained to evaluate the vascular anatomy. Multidetector CT imaging of the abdomen and pelvis was performed using the standard protocol during bolus administration of intravenous contrast. CONTRAST:  177mL OMNIPAQUE IOHEXOL 350 MG/ML SOLN COMPARISON:  Prior CT from 03/01/2018 FINDINGS: CTA CHEST FINDINGS Cardiovascular: Intrathoracic aorta of normal caliber without aneurysm or other acute abnormality. Moderate aortic atherosclerosis. Visualized great vessels tortuous but within normal limits. Mild cardiomegaly no pericardial effusion. Pulmonary arterial tree adequately opacified for evaluation. Main pulmonary artery dilated up to 3.4 cm, suggesting underlying pulmonary hypertension. No filling defect to suggest acute pulmonary embolism. Re-formatted imaging confirms these findings. Mediastinum/Nodes: Visualized thyroid within normal limits. No enlarged mediastinal, hilar, or axillary lymph nodes. Esophagus within normal limits. Lungs/Pleura: Tracheobronchial tree intact and patent. Diffuse central airway thickening noted. Lungs hypoinflated bilaterally with elevation of the hemidiaphragms. Trace layering bilateral pleural effusions, left slightly larger than right. Associated bibasilar atelectasis. No other focal infiltrates. No pulmonary edema. No pneumothorax. Few scattered subpleural subcentimeter nodular densities measuring up to 5 mm noted within the peripheral upper lobes bilaterally, indeterminate. Musculoskeletal: Median sternotomy  wires noted. No acute osseous abnormality. No worrisome lytic or blastic osseous lesions. Review of the MIP images confirms the above findings. CT ABDOMEN and PELVIS FINDINGS Hepatobiliary: Liver demonstrates a normal contrast enhanced appearance. Punctate granuloma noted within the caudate lobe. Gallbladder surgically absent. No biliary dilatation. Pancreas: Sequelae of prior distal pancreatectomy noted. Pancreas otherwise unremarkable. Spleen: Spleen is absent. Adrenals/Urinary Tract: Adrenal glands within normal limits. Kidneys equal in size with symmetric enhancement. Focal cortical thinning and scarring noted at the lower pole the right kidney. 6 mm nonobstructive stone present at the lower pole the right kidney. No other radiopaque calculi. No hydronephrosis or hydroureter. No focal enhancing renal mass. Partially distended bladder within normal limits. Stomach/Bowel: Stomach decompressed without acute finding. No evidence for bowel obstruction. Large volume retained stool throughout the colon, suggesting constipation. Negative appendix. No acute inflammatory changes seen about the bowels. Vascular/Lymphatic: Advanced aorto bi-iliac atherosclerotic disease. No aneurysm. Mesenteric vessels patent proximally. Two right-sided renal arteries noted. Retroaortic left renal vein noted as well. No adenopathy. Reproductive: Enlarged prostate measuring 6.3 cm in transverse diameter. Other: No free air or fluid. Musculoskeletal: Additional views of the left hip demonstrate an acute nondisplaced fracture through the superior and posterior aspect of the left acetabulum (series 6, image 25). Subtle medial extension towards the iliopectineal line (series 10, image 42). Anterior column intact. Left femoral head remains normally positioned and remains intact. Femoral neck and visualized proximal femur intact. Remainder of the visualized pelvis intact. No pubic diastasis. SI joints approximated. No other acute osseous  abnormality. No discrete lytic or blastic osseous lesions. Patient status post posterior decompression at L2 through L4. Moderate to advanced multilevel degenerative spondylolysis throughout the lumbar spine. Review of the MIP images confirms the above findings. IMPRESSION: CTA CHEST IMPRESSION 1. No CT evidence for acute pulmonary embolism. 2. Low lung volumes with associated trace layering bilateral pleural effusions and bibasilar atelectasis. No other active cardiopulmonary disease. 3. Central airway thickening with dilatation of the main pulmonary artery up to 3.4 cm, suggesting a degree of underlying pulmonary hypertension. CT ABDOMEN AND PELVIS IMPRESSION 1. Acute nondisplaced left  acetabular fracture as above. 2. No other acute abnormality within the abdomen and pelvis. 3. 6 mm nonobstructive right renal nephrolithiasis. 4. Large volume retained stool throughout the colon, suggesting constipation. 5. Advanced atherosclerosis with diffuse 3 vessel coronary artery calcifications. Electronically Signed   By: Jeannine Boga M.D.   On: 07/09/2018 03:35   Ct Abdomen Pelvis W Contrast  Result Date: 07/09/2018 CLINICAL DATA:  Initial evaluation for acute chest pain, abdominal pain. EXAM: CT ANGIOGRAPHY CHEST CT ABDOMEN AND PELVIS WITH CONTRAST TECHNIQUE: Multidetector CT imaging of the chest was performed using the standard protocol during bolus administration of intravenous contrast. Multiplanar CT image reconstructions and MIPs were obtained to evaluate the vascular anatomy. Multidetector CT imaging of the abdomen and pelvis was performed using the standard protocol during bolus administration of intravenous contrast. CONTRAST:  135mL OMNIPAQUE IOHEXOL 350 MG/ML SOLN COMPARISON:  Prior CT from 03/01/2018 FINDINGS: CTA CHEST FINDINGS Cardiovascular: Intrathoracic aorta of normal caliber without aneurysm or other acute abnormality. Moderate aortic atherosclerosis. Visualized great vessels tortuous but  within normal limits. Mild cardiomegaly no pericardial effusion. Pulmonary arterial tree adequately opacified for evaluation. Main pulmonary artery dilated up to 3.4 cm, suggesting underlying pulmonary hypertension. No filling defect to suggest acute pulmonary embolism. Re-formatted imaging confirms these findings. Mediastinum/Nodes: Visualized thyroid within normal limits. No enlarged mediastinal, hilar, or axillary lymph nodes. Esophagus within normal limits. Lungs/Pleura: Tracheobronchial tree intact and patent. Diffuse central airway thickening noted. Lungs hypoinflated bilaterally with elevation of the hemidiaphragms. Trace layering bilateral pleural effusions, left slightly larger than right. Associated bibasilar atelectasis. No other focal infiltrates. No pulmonary edema. No pneumothorax. Few scattered subpleural subcentimeter nodular densities measuring up to 5 mm noted within the peripheral upper lobes bilaterally, indeterminate. Musculoskeletal: Median sternotomy wires noted. No acute osseous abnormality. No worrisome lytic or blastic osseous lesions. Review of the MIP images confirms the above findings. CT ABDOMEN and PELVIS FINDINGS Hepatobiliary: Liver demonstrates a normal contrast enhanced appearance. Punctate granuloma noted within the caudate lobe. Gallbladder surgically absent. No biliary dilatation. Pancreas: Sequelae of prior distal pancreatectomy noted. Pancreas otherwise unremarkable. Spleen: Spleen is absent. Adrenals/Urinary Tract: Adrenal glands within normal limits. Kidneys equal in size with symmetric enhancement. Focal cortical thinning and scarring noted at the lower pole the right kidney. 6 mm nonobstructive stone present at the lower pole the right kidney. No other radiopaque calculi. No hydronephrosis or hydroureter. No focal enhancing renal mass. Partially distended bladder within normal limits. Stomach/Bowel: Stomach decompressed without acute finding. No evidence for bowel  obstruction. Large volume retained stool throughout the colon, suggesting constipation. Negative appendix. No acute inflammatory changes seen about the bowels. Vascular/Lymphatic: Advanced aorto bi-iliac atherosclerotic disease. No aneurysm. Mesenteric vessels patent proximally. Two right-sided renal arteries noted. Retroaortic left renal vein noted as well. No adenopathy. Reproductive: Enlarged prostate measuring 6.3 cm in transverse diameter. Other: No free air or fluid. Musculoskeletal: Additional views of the left hip demonstrate an acute nondisplaced fracture through the superior and posterior aspect of the left acetabulum (series 6, image 25). Subtle medial extension towards the iliopectineal line (series 10, image 42). Anterior column intact. Left femoral head remains normally positioned and remains intact. Femoral neck and visualized proximal femur intact. Remainder of the visualized pelvis intact. No pubic diastasis. SI joints approximated. No other acute osseous abnormality. No discrete lytic or blastic osseous lesions. Patient status post posterior decompression at L2 through L4. Moderate to advanced multilevel degenerative spondylolysis throughout the lumbar spine. Review of the MIP images confirms the above findings. IMPRESSION:  CTA CHEST IMPRESSION 1. No CT evidence for acute pulmonary embolism. 2. Low lung volumes with associated trace layering bilateral pleural effusions and bibasilar atelectasis. No other active cardiopulmonary disease. 3. Central airway thickening with dilatation of the main pulmonary artery up to 3.4 cm, suggesting a degree of underlying pulmonary hypertension. CT ABDOMEN AND PELVIS IMPRESSION 1. Acute nondisplaced left acetabular fracture as above. 2. No other acute abnormality within the abdomen and pelvis. 3. 6 mm nonobstructive right renal nephrolithiasis. 4. Large volume retained stool throughout the colon, suggesting constipation. 5. Advanced atherosclerosis with diffuse 3  vessel coronary artery calcifications. Electronically Signed   By: Jeannine Boga M.D.   On: 07/09/2018 03:35   Ct No Charge  Result Date: 07/09/2018 CLINICAL DATA:  Initial evaluation for acute chest pain, abdominal pain. EXAM: CT ANGIOGRAPHY CHEST CT ABDOMEN AND PELVIS WITH CONTRAST TECHNIQUE: Multidetector CT imaging of the chest was performed using the standard protocol during bolus administration of intravenous contrast. Multiplanar CT image reconstructions and MIPs were obtained to evaluate the vascular anatomy. Multidetector CT imaging of the abdomen and pelvis was performed using the standard protocol during bolus administration of intravenous contrast. CONTRAST:  120mL OMNIPAQUE IOHEXOL 350 MG/ML SOLN COMPARISON:  Prior CT from 03/01/2018 FINDINGS: CTA CHEST FINDINGS Cardiovascular: Intrathoracic aorta of normal caliber without aneurysm or other acute abnormality. Moderate aortic atherosclerosis. Visualized great vessels tortuous but within normal limits. Mild cardiomegaly no pericardial effusion. Pulmonary arterial tree adequately opacified for evaluation. Main pulmonary artery dilated up to 3.4 cm, suggesting underlying pulmonary hypertension. No filling defect to suggest acute pulmonary embolism. Re-formatted imaging confirms these findings. Mediastinum/Nodes: Visualized thyroid within normal limits. No enlarged mediastinal, hilar, or axillary lymph nodes. Esophagus within normal limits. Lungs/Pleura: Tracheobronchial tree intact and patent. Diffuse central airway thickening noted. Lungs hypoinflated bilaterally with elevation of the hemidiaphragms. Trace layering bilateral pleural effusions, left slightly larger than right. Associated bibasilar atelectasis. No other focal infiltrates. No pulmonary edema. No pneumothorax. Few scattered subpleural subcentimeter nodular densities measuring up to 5 mm noted within the peripheral upper lobes bilaterally, indeterminate. Musculoskeletal: Median  sternotomy wires noted. No acute osseous abnormality. No worrisome lytic or blastic osseous lesions. Review of the MIP images confirms the above findings. CT ABDOMEN and PELVIS FINDINGS Hepatobiliary: Liver demonstrates a normal contrast enhanced appearance. Punctate granuloma noted within the caudate lobe. Gallbladder surgically absent. No biliary dilatation. Pancreas: Sequelae of prior distal pancreatectomy noted. Pancreas otherwise unremarkable. Spleen: Spleen is absent. Adrenals/Urinary Tract: Adrenal glands within normal limits. Kidneys equal in size with symmetric enhancement. Focal cortical thinning and scarring noted at the lower pole the right kidney. 6 mm nonobstructive stone present at the lower pole the right kidney. No other radiopaque calculi. No hydronephrosis or hydroureter. No focal enhancing renal mass. Partially distended bladder within normal limits. Stomach/Bowel: Stomach decompressed without acute finding. No evidence for bowel obstruction. Large volume retained stool throughout the colon, suggesting constipation. Negative appendix. No acute inflammatory changes seen about the bowels. Vascular/Lymphatic: Advanced aorto bi-iliac atherosclerotic disease. No aneurysm. Mesenteric vessels patent proximally. Two right-sided renal arteries noted. Retroaortic left renal vein noted as well. No adenopathy. Reproductive: Enlarged prostate measuring 6.3 cm in transverse diameter. Other: No free air or fluid. Musculoskeletal: Additional views of the left hip demonstrate an acute nondisplaced fracture through the superior and posterior aspect of the left acetabulum (series 6, image 25). Subtle medial extension towards the iliopectineal line (series 10, image 42). Anterior column intact. Left femoral head remains normally positioned and remains intact. Femoral  neck and visualized proximal femur intact. Remainder of the visualized pelvis intact. No pubic diastasis. SI joints approximated. No other acute  osseous abnormality. No discrete lytic or blastic osseous lesions. Patient status post posterior decompression at L2 through L4. Moderate to advanced multilevel degenerative spondylolysis throughout the lumbar spine. Review of the MIP images confirms the above findings. IMPRESSION: CTA CHEST IMPRESSION 1. No CT evidence for acute pulmonary embolism. 2. Low lung volumes with associated trace layering bilateral pleural effusions and bibasilar atelectasis. No other active cardiopulmonary disease. 3. Central airway thickening with dilatation of the main pulmonary artery up to 3.4 cm, suggesting a degree of underlying pulmonary hypertension. CT ABDOMEN AND PELVIS IMPRESSION 1. Acute nondisplaced left acetabular fracture as above. 2. No other acute abnormality within the abdomen and pelvis. 3. 6 mm nonobstructive right renal nephrolithiasis. 4. Large volume retained stool throughout the colon, suggesting constipation. 5. Advanced atherosclerosis with diffuse 3 vessel coronary artery calcifications. Electronically Signed   By: Jeannine Boga M.D.   On: 07/09/2018 03:35   Dg Hip Unilat W Or Wo Pelvis 2-3 Views Left  Result Date: 07/08/2018 CLINICAL DATA:  Recent fall from ladder with left hip pain, initial encounter EXAM: DG HIP (WITH OR WITHOUT PELVIS) 3V LEFT COMPARISON:  05/19/2014 FINDINGS: Pelvic ring is intact. Degenerative changes of lumbar spine and hip joints are noted. No acute fractures noted. IMPRESSION: No fracture identified. Electronically Signed   By: Inez Catalina M.D.   On: 07/08/2018 21:03   Assessment and Plan:   1.  Elevated troponin wih hx of CAD s/p CABG: -Presented to [facility] on 07/09/2018 with a fracture to his left acetabulum after falling off a stepladder. While in the ED, it was reported that he developed chest pain in which troponin level was drawn. Initially, this was negative at <0.03, then began to uptrend at 0.03>0.13>0.28. Pt denies chest pain on hospital presentation. -EKG  performed which shows Q waves in inferior leads and no acute changes, similar to prior tracing from 05/28/2018. -CTA was performed which showed no PE, low lung volumes with trace bilateral pleural effusions and bibasilar atelectasis with similar findings on CXR. -Continue heparin gtt as well as home ASA and statin -Pt recently seen by cardiology 06/14/2018 for dyspnea (improved) and chronic chest pain in which a Myovue stress was completed 05/16/2018 that was non ischemic with EF 78%. Despite Myoview, he has had persistent pain and was referred for definative cath which was done 05/28/2018. Patient's grafts and diffuse disease distal to grafts not amenable to intervention with no change from prior cath 02/20/17. Medical Rx recommended>>restarted on Ranexa on 06/10/18 -Continue ASA, atorvastatin -Continue Ranexa>> consider up titration to 1000 BID  -Elevated troponin likely secondary to demand ischemia due to known distal disease -Would continue current regimen for know and monitor for acute changes  3.  Left acetabulum fracture: -Patient presented after a mechanical fall off of a stool in which imaging was performed that showed a left acetabulum fracture. -Internal medicine admitted with plans for surgical team evaluation -Patient had complaints of chest pain with known long history of CAD and chronic chest discomfort. -See plan above -Left acetabulum fracture >>non-operative management with immobilization   4.  Hypertension: -Stable, 132/64, 143/67, 146/70 -Continue amlodipine 10, lisinopril 20, HCTZ 12.5,  5.  DM2: -SSI for glucose control while inpatient status -Per internal medicine   For questions or updates, please contact [facility] Please consult [email or web address] for contact info under Cardiology/STEMI.   Signed, Kathyrn Drown NP-C  [facility] Pager: [phone] 07/09/2018 11:18 AM

## 2018-07-09 NOTE — H&P (Signed)
History and Physical    Ricardo Le RJJ:[id number] DOB: 1940/03/20 DOA: 07/08/2018  PCP: Angelina Sheriff, MD  Patient coming from: Home  I have personally briefly reviewed patient's old medical records in [place]  Chief Complaint: Fall, hip pain  HPI: Ricardo Le is a 78 y.o. male with medical history significant of DM2, HTN, CAD s/p CABG, multiple months of CP, anginal with cath on 4/14 showing severe distal disease from grafts but not amenable to stenting and stable since Jan 2019, medical management was recommended.  Today he fell down 1 step off of a ladder, landed on buttocks.  L hip pain and inability to bear weight following fall.   ED Course: L hip non-displaced acetabular fx.  While in ED developed CP which he relates to his ongoing CP problems.  Trop has trended up from <0.03, to 0.03.  Cards consulted by EDP but just said to trend troponins, patient not having STEMI.  Noted to be satting 88% on RA, CTA chest as well as CT AP performed.  Only major finding is the non-displaced acetabular fx as well as constipation (which patient says is chronic).   Review of Systems: As per HPI otherwise 10 point review of systems negative.   Past Medical History:  Diagnosis Date   CAD (coronary artery disease)    a. 04/2007 s/p CABG x 4  - LIMA->LAD, VG->OM1->Om2, VG->PDA;  b. 12/2008 & 06/2010 Caths - Native multivessel dzs with 4/4 patent grafts. c. cath 03/09/2015 patent grafts, unchanged from prior cath.   Cancer [facility])    pancreatic  March 2011   Carotid bruit    a. 07/2010 U/S- 0-39% bilat ICA stenosis   Chest pain    Noncardiac probably related to reflux   DDD (degenerative disc disease)    several surgeries   GERD (gastroesophageal reflux disease)    HNP (herniated nucleus pulposus), lumbar    a. L2-3, s/p laminotomy, microdiskectomy 02/2009   HOH (hard of hearing)    uses amplifiers    Nephrolithiasis    PAD (peripheral artery disease) ([place])  04/2014   s/p L Common Iliac Stent   Palpitations    Pancreatic mass    a. 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   Pneumonia    hx of  pna   Pure hypercholesterolemia    statin intolerance   Type II or unspecified type diabetes mellitus without mention of complication, not stated as uncontrolled    insulin dependent   Unspecified essential hypertension     Past Surgical History:  Procedure Laterality Date   ABDOMINAL AORTAGRAM N/A 05/13/2014   Procedure: ABDOMINAL [name];  Surgeon: Wellington Hampshire, MD;  Location: [place] CATH LAB;  Service: Cardiovascular;  Laterality: N/A;   ANTERIOR CERVICAL DECOMP/DISCECTOMY FUSION N/A 07/28/2014   Procedure: ANTERIOR CERVICAL DECOMPRESSION/DISCECTOMY FUSION CERVICAL FOUR-FIVE CERVICAL FIVE-SIX ;  Surgeon: Earnie Larsson, MD;  Location: [place] NEURO ORS;  Service: Neurosurgery;  Laterality: N/A;   APPENDECTOMY     arthroscopy right knee     BACK SURGERY     CARDIAC CATHETERIZATION N/A 03/09/2015   Procedure: Left Heart Cath and Cors/Grafts Angiography;  Surgeon: [name] Sine, MD;  Location: [place] CV LAB;  Service: Cardiovascular;  Laterality: N/A;   CHOLECYSTECTOMY     CORONARY ARTERY BYPASS GRAFT     X 4 2009 (LIMA to LAD, SVG to first cicumflex marginal branch with sequential SVG to second cicumflex marginal branch  and  saphenous vein to posterior descending coronary artery with endoscopic,vein harvest rt. lower exttremity by Dr.Owen on March 31,2009   CYST REMOVED     FROM SPINE   CYSTOURETHROSCOPY     right retrograde pyelogram,manipulate stone in the renal pelvis, rt. double-j catheter.   EYE SURGERY     KNEE SURGERY     rt knee   LEFT HEART CATH AND CORS/GRAFTS ANGIOGRAPHY N/A 02/20/2017   Procedure: LEFT HEART CATH AND CORS/GRAFTS ANGIOGRAPHY;  Surgeon: Leonie Man, MD;  Location: [place] CV LAB;  Service: Cardiovascular;  Laterality: N/A;   LEFT HEART CATH AND CORS/GRAFTS ANGIOGRAPHY N/A  05/28/2018   Procedure: LEFT HEART CATH AND CORS/GRAFTS ANGIOGRAPHY;  Surgeon: Burnell Blanks, MD;  Location: [place] CV LAB;  Service: Cardiovascular;  Laterality: N/A;   NEPHROLITHOTOMY     PERCUTANEOUS   PARS PLANA VITRECTOMY     lt. eye,retinal photocoagulation lt. eye, membrane peel lt. eye.   PERCUTANEOUS STENT INTERVENTION Left 05/13/2014   Procedure: PERCUTANEOUS STENT INTERVENTION;  Surgeon: Wellington Hampshire, MD;  Location: [place] CATH LAB;  Service: Cardiovascular;  Laterality: Left;  COMMON ILLIAC   re-exploratin of laminectomy  05/2008   (RT L2-3) WITH REDO MICRODISKECTOMY     reports that he has never smoked. He has never used smokeless tobacco. He reports that he does not drink alcohol or use drugs.  Allergies  Allergen Reactions   Rosuvastatin Calcium Other (See Comments)    Myalgias   Iohexol Hives    pt ok with premedication (Benadryl 50mg  PO), Onset Date: [id number]    Statins Other (See Comments)    Myalgias     Family History  Problem Relation Age of Onset   Heart attack Father        died of MI @ 5   Diabetes Brother        type 2   Other Mother        died @ 11   Coronary artery disease Sister        alive   Other Other        no fh of colon cancer   Coronary artery disease Brother        s/p CABG.  Alive @ 22     Prior to Admission medications   Medication Sig Start Date End Date Taking? Authorizing Provider  acetaminophen (TYLENOL 8 HOUR ARTHRITIS PAIN) 650 MG CR tablet Take 650 mg by mouth every 8 (eight) hours as needed for pain (arthritis).    [provider]  acetaminophen (TYLENOL) 325 MG tablet Take 650 mg by mouth every 6 (six) hours as needed (pain).    [provider]  amLODipine (NORVASC) 10 MG tablet Take 10 mg by mouth daily. 08/16/16   [provider]  aspirin EC 81 MG tablet Take 81 mg by mouth at bedtime.    [provider]  atorvastatin (LIPITOR) 20 MG tablet Take 20 mg by  mouth daily.  04/14/18   [provider]  esomeprazole (NEXIUM) 40 MG capsule Take 40 mg by mouth daily. 06/18/18   [provider]  gabapentin (NEURONTIN) 100 MG capsule Take 100 mg by mouth 2 (two) times daily.    [provider]  glipiZIDE (GLUCOTROL) 10 MG tablet Take 10 mg by mouth 2 (two) times daily.     [provider]  insulin glargine (LANTUS SOLOSTAR) 100 UNIT/ML injection Inject 15 Units into the skin at bedtime.     [provider]  lisinopril-hydrochlorothiazide (PRINZIDE,ZESTORETIC) 20-12.5 MG per tablet Take 1 tablet by mouth daily.      [provider]  Multiple Vitamin (MULTIVITAMIN WITH MINERALS) TABS tablet Take 1 tablet by mouth daily.    [provider]  nitroGLYCERIN (NITROSTAT) 0.4 MG SL tablet Place 1 tablet (0.4 mg total) under the tongue every 5 (five) minutes as needed for chest pain. 05/02/18 05/01/19  Josue Hector, MD  ranolazine (RANEXA) 500 MG 12 hr tablet Take 1 tablet (500 mg total) by mouth 2 (two) times daily. 06/10/18   Burtis Junes, NP  tamsulosin (FLOMAX) 0.4 MG CAPS capsule Take 0.4 mg by mouth daily after supper.    [provider]    Physical Exam: Vitals:   07/09/18 0100 07/09/18 0145 07/09/18 0230 07/09/18 0406  BP: (!) 152/67 130/60 (!) 172/72 (!) 146/70  Pulse: 80 67 87 84  Resp: (!) 32 17 (!) 27 (!) 21  Temp:      TempSrc:      SpO2: 97% 97% 96% 96%    Constitutional: NAD, calm, comfortable Eyes: PERRL, lids and conjunctivae normal ENMT: Mucous membranes are moist. Posterior pharynx clear of any exudate or lesions.Normal dentition.  Neck: normal, supple, no masses, no thyromegaly Respiratory: clear to auscultation bilaterally, no wheezing, no crackles. Normal respiratory effort. No accessory muscle use.  Cardiovascular: Regular rate and rhythm, no murmurs / rubs / gallops. No extremity edema. 2+ pedal pulses. No carotid bruits.  Abdomen: no tenderness, no masses  palpated. No hepatosplenomegaly. Bowel sounds positive.  Musculoskeletal: no clubbing / cyanosis. No joint deformity upper and lower extremities. Good ROM, no contractures. Normal muscle tone.  Skin: no rashes, lesions, ulcers. No induration Neurologic: CN 2-12 grossly intact. Sensation intact, DTR normal. Strength 5/5 in all 4.  Psychiatric: Normal judgment and insight. Alert and oriented x 3. Normal mood.    Labs on Admission: I have personally reviewed following labs and imaging studies  CBC: Recent Labs  Lab 07/04/18 1311 07/08/18 1829 07/08/18 2215  WBC 9.6 17.9*  --   NEUTROABS 5.6  --   --   HGB 14.8 14.5 14.3  HCT 42.4 43.6 42.0  MCV 94 94.0  --   PLT 279 223  --    Basic Metabolic Panel: Recent Labs  Lab 07/04/18 1311 07/08/18 1829 07/08/18 2215  NA 138 134* 134*  K 4.7 4.2 3.8  CL 95* 99  --   CO2 27 21*  --   GLUCOSE 214* 189*  --   BUN 21 27*  --   CREATININE 0.89 1.04  --   CALCIUM 9.9 9.5  --    GFR: Estimated Creatinine Clearance: 52.6 mL/min (by C-G formula based on SCr of 1.04 mg/dL). Liver Function Tests: No results for input(s): AST, ALT, ALKPHOS, BILITOT, PROT, ALBUMIN in the last 168 hours. No results for input(s): LIPASE, AMYLASE in the last 168 hours. No results for input(s): AMMONIA in the last 168 hours. Coagulation Profile: No results for input(s): INR, PROTIME in the last 168 hours. Cardiac Enzymes: Recent Labs  Lab 07/08/18 1829 07/08/18 2346 07/09/18 0332  TROPONINI <0.03 0.03* 0.13*   BNP (last 3 results) Recent Labs    07/04/18 1311  PROBNP 137   HbA1C: No results for input(s): HGBA1C in the last 72 hours. CBG: No results for input(s): GLUCAP in the last 168 hours. Lipid Profile: No results for input(s): CHOL, HDL, LDLCALC, TRIG, CHOLHDL, LDLDIRECT in the last 72 hours. Thyroid Function Tests:  No results for input(s): TSH, T4TOTAL, FREET4, T3FREE, THYROIDAB in the last 72 hours. Anemia Panel: No results for input(s):  VITAMINB12, FOLATE, FERRITIN, TIBC, IRON, RETICCTPCT in the last 72 hours. Urine analysis:    Component Value Date/Time   COLORURINE YELLOW 07/08/2018 2217   APPEARANCEUR CLEAR 07/08/2018 2217   LABSPEC 1.018 07/08/2018 2217   PHURINE 6.0 07/08/2018 2217   GLUCOSEU NEGATIVE 07/08/2018 2217   GLUCOSEU NEGATIVE 05/27/2009 1411   [place] 07/08/2018 2217   [place] NEGATIVE 07/08/2018 2217   [place] 07/08/2018 2217   PROTEINUR NEGATIVE 07/08/2018 2217   UROBILINOGEN 0.2 05/18/2014 1130   NITRITE NEGATIVE 07/08/2018 2217   LEUKOCYTESUR NEGATIVE 07/08/2018 2217    Radiological Exams on Admission: Dg Chest 1 View  Result Date: 07/08/2018 CLINICAL DATA:  Fall from ladder with chest pain, initial encounter EXAM: CHEST  1 VIEW COMPARISON:  07/04/2018 FINDINGS: Cardiac shadow is within normal limits. Postsurgical changes. The lungs are well aerated with mild left basilar atelectasis. No focal infiltrate is seen. No acute bony is noted. IMPRESSION: Mild left basilar atelectasis. Electronically Signed   By: Inez Catalina M.D.   On: 07/08/2018 21:02   Ct Angio Chest Pe W And/or Wo Contrast  Result Date: 07/09/2018 CLINICAL DATA:  Initial evaluation for acute chest pain, abdominal pain. EXAM: CT ANGIOGRAPHY CHEST CT ABDOMEN AND PELVIS WITH CONTRAST TECHNIQUE: Multidetector CT imaging of the chest was performed using the standard protocol during bolus administration of intravenous contrast. Multiplanar CT image reconstructions and MIPs were obtained to evaluate the vascular anatomy. Multidetector CT imaging of the abdomen and pelvis was performed using the standard protocol during bolus administration of intravenous contrast. CONTRAST:  173mL OMNIPAQUE IOHEXOL 350 MG/ML SOLN COMPARISON:  Prior CT from 03/01/2018 FINDINGS: CTA CHEST FINDINGS Cardiovascular: Intrathoracic aorta of normal caliber without aneurysm or other acute abnormality. Moderate aortic atherosclerosis. Visualized great  vessels tortuous but within normal limits. Mild cardiomegaly no pericardial effusion. Pulmonary arterial tree adequately opacified for evaluation. Main pulmonary artery dilated up to 3.4 cm, suggesting underlying pulmonary hypertension. No filling defect to suggest acute pulmonary embolism. Re-formatted imaging confirms these findings. Mediastinum/Nodes: Visualized thyroid within normal limits. No enlarged mediastinal, hilar, or axillary lymph nodes. Esophagus within normal limits. Lungs/Pleura: Tracheobronchial tree intact and patent. Diffuse central airway thickening noted. Lungs hypoinflated bilaterally with elevation of the hemidiaphragms. Trace layering bilateral pleural effusions, left slightly larger than right. Associated bibasilar atelectasis. No other focal infiltrates. No pulmonary edema. No pneumothorax. Few scattered subpleural subcentimeter nodular densities measuring up to 5 mm noted within the peripheral upper lobes bilaterally, indeterminate. Musculoskeletal: Median sternotomy wires noted. No acute osseous abnormality. No worrisome lytic or blastic osseous lesions. Review of the MIP images confirms the above findings. CT ABDOMEN and PELVIS FINDINGS Hepatobiliary: Liver demonstrates a normal contrast enhanced appearance. Punctate granuloma noted within the caudate lobe. Gallbladder surgically absent. No biliary dilatation. Pancreas: Sequelae of prior distal pancreatectomy noted. Pancreas otherwise unremarkable. Spleen: Spleen is absent. Adrenals/Urinary Tract: Adrenal glands within normal limits. Kidneys equal in size with symmetric enhancement. Focal cortical thinning and scarring noted at the lower pole the right kidney. 6 mm nonobstructive stone present at the lower pole the right kidney. No other radiopaque calculi. No hydronephrosis or hydroureter. No focal enhancing renal mass. Partially distended bladder within normal limits. Stomach/Bowel: Stomach decompressed without acute finding. No  evidence for bowel obstruction. Large volume retained stool throughout the colon, suggesting constipation. Negative appendix. No acute inflammatory changes seen about the bowels. Vascular/Lymphatic: Advanced  aorto bi-iliac atherosclerotic disease. No aneurysm. Mesenteric vessels patent proximally. Two right-sided renal arteries noted. Retroaortic left renal vein noted as well. No adenopathy. Reproductive: Enlarged prostate measuring 6.3 cm in transverse diameter. Other: No free air or fluid. Musculoskeletal: Additional views of the left hip demonstrate an acute nondisplaced fracture through the superior and posterior aspect of the left acetabulum (series 6, image 25). Subtle medial extension towards the iliopectineal line (series 10, image 42). Anterior column intact. Left femoral head remains normally positioned and remains intact. Femoral neck and visualized proximal femur intact. Remainder of the visualized pelvis intact. No pubic diastasis. SI joints approximated. No other acute osseous abnormality. No discrete lytic or blastic osseous lesions. Patient status post posterior decompression at L2 through L4. Moderate to advanced multilevel degenerative spondylolysis throughout the lumbar spine. Review of the MIP images confirms the above findings. IMPRESSION: CTA CHEST IMPRESSION 1. No CT evidence for acute pulmonary embolism. 2. Low lung volumes with associated trace layering bilateral pleural effusions and bibasilar atelectasis. No other active cardiopulmonary disease. 3. Central airway thickening with dilatation of the main pulmonary artery up to 3.4 cm, suggesting a degree of underlying pulmonary hypertension. CT ABDOMEN AND PELVIS IMPRESSION 1. Acute nondisplaced left acetabular fracture as above. 2. No other acute abnormality within the abdomen and pelvis. 3. 6 mm nonobstructive right renal nephrolithiasis. 4. Large volume retained stool throughout the colon, suggesting constipation. 5. Advanced  atherosclerosis with diffuse 3 vessel coronary artery calcifications. Electronically Signed   By: Jeannine Boga M.D.   On: 07/09/2018 03:35   Ct Abdomen Pelvis W Contrast  Result Date: 07/09/2018 CLINICAL DATA:  Initial evaluation for acute chest pain, abdominal pain. EXAM: CT ANGIOGRAPHY CHEST CT ABDOMEN AND PELVIS WITH CONTRAST TECHNIQUE: Multidetector CT imaging of the chest was performed using the standard protocol during bolus administration of intravenous contrast. Multiplanar CT image reconstructions and MIPs were obtained to evaluate the vascular anatomy. Multidetector CT imaging of the abdomen and pelvis was performed using the standard protocol during bolus administration of intravenous contrast. CONTRAST:  134mL OMNIPAQUE IOHEXOL 350 MG/ML SOLN COMPARISON:  Prior CT from 03/01/2018 FINDINGS: CTA CHEST FINDINGS Cardiovascular: Intrathoracic aorta of normal caliber without aneurysm or other acute abnormality. Moderate aortic atherosclerosis. Visualized great vessels tortuous but within normal limits. Mild cardiomegaly no pericardial effusion. Pulmonary arterial tree adequately opacified for evaluation. Main pulmonary artery dilated up to 3.4 cm, suggesting underlying pulmonary hypertension. No filling defect to suggest acute pulmonary embolism. Re-formatted imaging confirms these findings. Mediastinum/Nodes: Visualized thyroid within normal limits. No enlarged mediastinal, hilar, or axillary lymph nodes. Esophagus within normal limits. Lungs/Pleura: Tracheobronchial tree intact and patent. Diffuse central airway thickening noted. Lungs hypoinflated bilaterally with elevation of the hemidiaphragms. Trace layering bilateral pleural effusions, left slightly larger than right. Associated bibasilar atelectasis. No other focal infiltrates. No pulmonary edema. No pneumothorax. Few scattered subpleural subcentimeter nodular densities measuring up to 5 mm noted within the peripheral upper lobes  bilaterally, indeterminate. Musculoskeletal: Median sternotomy wires noted. No acute osseous abnormality. No worrisome lytic or blastic osseous lesions. Review of the MIP images confirms the above findings. CT ABDOMEN and PELVIS FINDINGS Hepatobiliary: Liver demonstrates a normal contrast enhanced appearance. Punctate granuloma noted within the caudate lobe. Gallbladder surgically absent. No biliary dilatation. Pancreas: Sequelae of prior distal pancreatectomy noted. Pancreas otherwise unremarkable. Spleen: Spleen is absent. Adrenals/Urinary Tract: Adrenal glands within normal limits. Kidneys equal in size with symmetric enhancement. Focal cortical thinning and scarring noted at the lower pole the right kidney. 6 mm  nonobstructive stone present at the lower pole the right kidney. No other radiopaque calculi. No hydronephrosis or hydroureter. No focal enhancing renal mass. Partially distended bladder within normal limits. Stomach/Bowel: Stomach decompressed without acute finding. No evidence for bowel obstruction. Large volume retained stool throughout the colon, suggesting constipation. Negative appendix. No acute inflammatory changes seen about the bowels. Vascular/Lymphatic: Advanced aorto bi-iliac atherosclerotic disease. No aneurysm. Mesenteric vessels patent proximally. Two right-sided renal arteries noted. Retroaortic left renal vein noted as well. No adenopathy. Reproductive: Enlarged prostate measuring 6.3 cm in transverse diameter. Other: No free air or fluid. Musculoskeletal: Additional views of the left hip demonstrate an acute nondisplaced fracture through the superior and posterior aspect of the left acetabulum (series 6, image 25). Subtle medial extension towards the iliopectineal line (series 10, image 42). Anterior column intact. Left femoral head remains normally positioned and remains intact. Femoral neck and visualized proximal femur intact. Remainder of the visualized pelvis intact. No pubic  diastasis. SI joints approximated. No other acute osseous abnormality. No discrete lytic or blastic osseous lesions. Patient status post posterior decompression at L2 through L4. Moderate to advanced multilevel degenerative spondylolysis throughout the lumbar spine. Review of the MIP images confirms the above findings. IMPRESSION: CTA CHEST IMPRESSION 1. No CT evidence for acute pulmonary embolism. 2. Low lung volumes with associated trace layering bilateral pleural effusions and bibasilar atelectasis. No other active cardiopulmonary disease. 3. Central airway thickening with dilatation of the main pulmonary artery up to 3.4 cm, suggesting a degree of underlying pulmonary hypertension. CT ABDOMEN AND PELVIS IMPRESSION 1. Acute nondisplaced left acetabular fracture as above. 2. No other acute abnormality within the abdomen and pelvis. 3. 6 mm nonobstructive right renal nephrolithiasis. 4. Large volume retained stool throughout the colon, suggesting constipation. 5. Advanced atherosclerosis with diffuse 3 vessel coronary artery calcifications. Electronically Signed   By: Jeannine Boga M.D.   On: 07/09/2018 03:35   Ct No Charge  Result Date: 07/09/2018 CLINICAL DATA:  Initial evaluation for acute chest pain, abdominal pain. EXAM: CT ANGIOGRAPHY CHEST CT ABDOMEN AND PELVIS WITH CONTRAST TECHNIQUE: Multidetector CT imaging of the chest was performed using the standard protocol during bolus administration of intravenous contrast. Multiplanar CT image reconstructions and MIPs were obtained to evaluate the vascular anatomy. Multidetector CT imaging of the abdomen and pelvis was performed using the standard protocol during bolus administration of intravenous contrast. CONTRAST:  171mL OMNIPAQUE IOHEXOL 350 MG/ML SOLN COMPARISON:  Prior CT from 03/01/2018 FINDINGS: CTA CHEST FINDINGS Cardiovascular: Intrathoracic aorta of normal caliber without aneurysm or other acute abnormality. Moderate aortic atherosclerosis.  Visualized great vessels tortuous but within normal limits. Mild cardiomegaly no pericardial effusion. Pulmonary arterial tree adequately opacified for evaluation. Main pulmonary artery dilated up to 3.4 cm, suggesting underlying pulmonary hypertension. No filling defect to suggest acute pulmonary embolism. Re-formatted imaging confirms these findings. Mediastinum/Nodes: Visualized thyroid within normal limits. No enlarged mediastinal, hilar, or axillary lymph nodes. Esophagus within normal limits. Lungs/Pleura: Tracheobronchial tree intact and patent. Diffuse central airway thickening noted. Lungs hypoinflated bilaterally with elevation of the hemidiaphragms. Trace layering bilateral pleural effusions, left slightly larger than right. Associated bibasilar atelectasis. No other focal infiltrates. No pulmonary edema. No pneumothorax. Few scattered subpleural subcentimeter nodular densities measuring up to 5 mm noted within the peripheral upper lobes bilaterally, indeterminate. Musculoskeletal: Median sternotomy wires noted. No acute osseous abnormality. No worrisome lytic or blastic osseous lesions. Review of the MIP images confirms the above findings. CT ABDOMEN and PELVIS FINDINGS Hepatobiliary: Liver demonstrates a normal  contrast enhanced appearance. Punctate granuloma noted within the caudate lobe. Gallbladder surgically absent. No biliary dilatation. Pancreas: Sequelae of prior distal pancreatectomy noted. Pancreas otherwise unremarkable. Spleen: Spleen is absent. Adrenals/Urinary Tract: Adrenal glands within normal limits. Kidneys equal in size with symmetric enhancement. Focal cortical thinning and scarring noted at the lower pole the right kidney. 6 mm nonobstructive stone present at the lower pole the right kidney. No other radiopaque calculi. No hydronephrosis or hydroureter. No focal enhancing renal mass. Partially distended bladder within normal limits. Stomach/Bowel: Stomach decompressed without acute  finding. No evidence for bowel obstruction. Large volume retained stool throughout the colon, suggesting constipation. Negative appendix. No acute inflammatory changes seen about the bowels. Vascular/Lymphatic: Advanced aorto bi-iliac atherosclerotic disease. No aneurysm. Mesenteric vessels patent proximally. Two right-sided renal arteries noted. Retroaortic left renal vein noted as well. No adenopathy. Reproductive: Enlarged prostate measuring 6.3 cm in transverse diameter. Other: No free air or fluid. Musculoskeletal: Additional views of the left hip demonstrate an acute nondisplaced fracture through the superior and posterior aspect of the left acetabulum (series 6, image 25). Subtle medial extension towards the iliopectineal line (series 10, image 42). Anterior column intact. Left femoral head remains normally positioned and remains intact. Femoral neck and visualized proximal femur intact. Remainder of the visualized pelvis intact. No pubic diastasis. SI joints approximated. No other acute osseous abnormality. No discrete lytic or blastic osseous lesions. Patient status post posterior decompression at L2 through L4. Moderate to advanced multilevel degenerative spondylolysis throughout the lumbar spine. Review of the MIP images confirms the above findings. IMPRESSION: CTA CHEST IMPRESSION 1. No CT evidence for acute pulmonary embolism. 2. Low lung volumes with associated trace layering bilateral pleural effusions and bibasilar atelectasis. No other active cardiopulmonary disease. 3. Central airway thickening with dilatation of the main pulmonary artery up to 3.4 cm, suggesting a degree of underlying pulmonary hypertension. CT ABDOMEN AND PELVIS IMPRESSION 1. Acute nondisplaced left acetabular fracture as above. 2. No other acute abnormality within the abdomen and pelvis. 3. 6 mm nonobstructive right renal nephrolithiasis. 4. Large volume retained stool throughout the colon, suggesting constipation. 5. Advanced  atherosclerosis with diffuse 3 vessel coronary artery calcifications. Electronically Signed   By: Jeannine Boga M.D.   On: 07/09/2018 03:35   Dg Hip Unilat W Or Wo Pelvis 2-3 Views Left  Result Date: 07/08/2018 CLINICAL DATA:  Recent fall from ladder with left hip pain, initial encounter EXAM: DG HIP (WITH OR WITHOUT PELVIS) 3V LEFT COMPARISON:  05/19/2014 FINDINGS: Pelvic ring is intact. Degenerative changes of lumbar spine and hip joints are noted. No acute fractures noted. IMPRESSION: No fracture identified. Electronically Signed   By: Inez Catalina M.D.   On: 07/08/2018 21:03    EKG: Independently reviewed.  Assessment/Plan Principal Problem:   Fracture of left acetabulum (HCC) Active Problems:   Type 2 diabetes mellitus with vascular disease (HCC)   Essential hypertension   Constipation   S/P CABG x 4   Coronary artery disease involving native coronary artery of native heart with unstable angina pectoris ([place])    1. Fx of left acetabulum - 1. Dr. Percell Miller consulted 1. Will see in AM 2. Will be non-op 3. But keep patient NWB until he can review films 2. Morphine PRN pain 2. Unstable Angina - 1. With troponin trending up, now 0.13 2. Serial trops 3. Cards consult in AM, message sent to P.[facility] 4. Tele monitor 5. NPO 6. Heparin gtt 7. Cont home Statin, ASA 8. SL NTG PRN 3.  Constipation - 1. Scheduled Senna and docusate 2. PRN sorbitol 4. DM2 - 1. Lantus 15 QHS 2. Sensitive SSI Q4H 5. HTN - continue home BP meds  DVT prophylaxis: heparin gtt Code Status: Full Family Communication: No family in room Disposition Plan: Home after admit Consults called: Dr. Percell Miller, will also put in message to P. [facility]. Admission status: Place in obs    Amoni Morales, [place] [facility]  How to contact the [facility] Attending or Consulting provider [place] or covering provider during after hours [place], for this patient?  1. Check the care team in [facility] and look for a)  attending/consulting [facility] provider listed and b) the [facility] team listed 2. Log into [email or web address]  Amion Physician Scheduling and messaging for groups and whole hospitals  On call and physician scheduling software for group practices, residents, hospitalists and other medical providers for call, clinic, rotation and shift schedules. On[employer] is a hospital-wide system for scheduling doctors and paging doctors on call. EasyPlot is for scientific plotting and data analysis.  [email or web address]  and use [place]'s universal password to access. If you do not have the password, please contact the hospital operator.  3. Locate the [facility] provider you are looking for under [facility] and page to a number that you can be directly reached. 4. If you still have difficulty reaching the provider, please page the [facility] (Director on Call) for the Hospitalists listed on amion for assistance.  07/09/2018, 4:45 AM

## 2018-07-09 NOTE — Progress Notes (Signed)
ANTICOAGULATION CONSULT NOTE - Initial Consult  Pharmacy Consult for heparin Indication: chest pain/ACS  Allergies  Allergen Reactions  . Rosuvastatin Calcium Other (See Comments)    Myalgias  . Iohexol Hives    pt ok with premedication (Benadryl 50mg  PO), Onset Date: [id number]   . Statins Other (See Comments)    Myalgias     Patient Measurements: Height: 5\' 5"  (165.1 cm) Weight: 160 lb 11.5 oz (72.9 kg) IBW/kg (Calculated) : 61.5 Heparin Dosing Weight: 72.9  Vital Signs: Temp: 98.1 F (36.7 C) (05/25 1828) Temp Source: Oral (05/25 1828) BP: 146/70 (05/26 0406) Pulse Rate: 84 (05/26 0406)  Labs: Recent Labs    07/08/18 1829 07/08/18 2215 07/08/18 2346 07/09/18 0332  HGB 14.5 14.3  --   --   HCT 43.6 42.0  --   --   PLT 223  --   --   --   CREATININE 1.04  --   --   --   TROPONINI <0.03  --  0.03* 0.13*    Estimated Creatinine Clearance: 52.6 mL/min (by C-G formula based on SCr of 1.04 mg/dL).   Medical History: Past Medical History:  Diagnosis Date  . CAD (coronary artery disease)    a. 04/2007 s/p CABG x 4  - LIMA->LAD, VG->OM1->Om2, VG->PDA;  b. 12/2008 & 06/2010 Caths - Native multivessel dzs with 4/4 patent grafts. c. cath 03/09/2015 patent grafts, unchanged from prior cath.  . Cancer [facility])    pancreatic  March 2011  . Carotid bruit    a. 07/2010 U/S- 0-39% bilat ICA stenosis  . Chest pain    Noncardiac probably related to reflux  . DDD (degenerative disc disease)    several surgeries  . GERD (gastroesophageal reflux disease)   . HNP (herniated nucleus pulposus), lumbar    a. L2-3, s/p laminotomy, microdiskectomy 02/2009  . HOH (hard of hearing)    uses amplifiers   . Nephrolithiasis   . PAD (peripheral artery disease) ([place]) 04/2014   s/p L Common Iliac Stent  . Palpitations   . 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 Pneumonia    hx of  pna  . Pure hypercholesterolemia    statin intolerance  . Type II  or unspecified type diabetes mellitus without mention of complication, not stated as uncontrolled    insulin dependent  . Unspecified essential hypertension     Medications:  Scheduled:  . amLODipine  10 mg Oral Daily  . aspirin  324 mg Oral Once  . [START ON 07/10/2018] aspirin EC  81 mg Oral QHS  . atorvastatin  20 mg Oral Daily  . docusate sodium  100 mg Oral BID  . gabapentin  100 mg Oral BID  . lisinopril  20 mg Oral Daily   And  . hydrochlorothiazide  12.5 mg Oral Daily  . insulin aspart  0-9 Units Subcutaneous Q4H  . insulin glargine  15 Units Subcutaneous QHS  . multivitamin with minerals  1 tablet Oral Daily  . pantoprazole  80 mg Oral Q1200  . ranolazine  500 mg Oral BID  . senna  1 tablet Oral Daily  . tamsulosin  0.4 mg Oral QPC supper    Assessment: 36 yom presenting after a fell - developed CP while in the ED. Has hx of CAD. No AC PTA.   Hgb 14.3, plt 223. Troponin 0.03>0.13. No s/sx of bleeding.   Goal of Therapy:  Heparin level 0.3-0.7  units/ml Monitor platelets by anticoagulation protocol: Yes   Plan:  Give 4000 units bolus x 1 Start heparin infusion at 850 units/hr Check anti-Xa level in 8 hours and daily while on heparin Continue to monitor H&H and platelets  Antonietta Jewel, PharmD, BCCCP Clinical Pharmacist  Pager: [phone] Phone: [phone] 07/09/2018,5:09 AM

## 2018-07-09 NOTE — ED Notes (Signed)
Pt c/o chest pain, edp made aware, new ekg obtained and given to him

## 2018-07-09 NOTE — ED Notes (Signed)
Pt spouse updated

## 2018-07-09 NOTE — ED Notes (Signed)
Inez Catalina [phone] is pt wife, primary contact

## 2018-07-09 NOTE — ED Provider Notes (Signed)
Care assumed from Dr. Regenia Skeeter.  Patient fell down a ladder sustaining a left hip injury. He is awaiting CT scan but needs pretreatment first for contrast. Patient also with abdominal pain.  He is getting a CT scan to check this as well as CT angiogram of his chest.  Patient has developed some tightness in his chest after he came back from CT scan.  His EKG does show depression in V5 and V6 that is somewhat more progressed.  Her stable ST elevation in aVR and V1. Patient had catheterization in April that showed diffuse CAD that was managed medically.  This was discussed with cardiology Dr. Claude Lions reviewed EKG.  He agrees he does not meet STEMI criteria.  Imaging remarkable for nondisplaced acetabular fracture.  This is discussed with Dr. Percell Miller of orthopedics.  He states this will be nonoperative.  Recommends hospitalist admission and nonweightbearing.  CT scan shows no pulmonary embolism. CT findings discussed with patient.  His CT abdomen is negative for acute pathology.  His chest pain has resolved.  Troponin has become borderline +0.03.  Admission discussed with Dr. Alcario Drought.  Patient will need admission for his acetabular fracture as well as suspected unstable angina.    Ezequiel Essex, MD 07/09/18 [phone]

## 2018-07-09 NOTE — Progress Notes (Addendum)
Patient admitted after midnight with fracture left acetabulum after fall off of step ladder and workup revealed elevated troponins trending up. No chest pain. Hx unstable angina. Hep gtt started. Ortho and cards on board.   1. fx of left acetabulum. Fell backward off of step ladder. Evaluated by ortho who recommend non-operative management with mobilization and TDWB of LLE. Follow up in 2 weeks -main management -PT  2. Unstable angina. No chest pain. Troponin trending up. 0.03>0.13>0.28. Hep gtt initiated in ED. No events on tele. Home meds include renexa -await cards recs -continue hep gtt for now -continue statin and asa -continue renexa -anticipate medical mngment -will allow to eat  3. DM2. Serum glucose 214 on admission. Home meds include glipizide and lantus -holding glipizide for now -carb modified diet -SSI for optimal control -lantus -obtain A1c  4. HTN. Fair control. Home meds include amlodipine, lisinopril, hctz  -continue amlodipine -hold hctz and lisinopril for now -monitor  5. Constipation. Per imaging -scheduled senna and docusate -prn sorbitol  Updated wife on telephone  Dyanne Carrel, NP  Patient admitted after midnight.  PT eval pending.  Non operative management of left acetabular fracture.  Cardiology evaluation pending. Eulogio Bear DO  Addendum: D/c heparin-- troponin likely demand ischemia from fall/fracture.

## 2018-07-09 NOTE — ED Notes (Signed)
ED TO INPATIENT HANDOFF REPORT  ED Nurse Name and Phone #: [phone]  S Name/Age/Gender Ricardo Le 78 y.o. male Room/Bed: 016C/016C  Code Status   Code Status: Full Code  Home/SNF/Other Home Patient oriented to: self, place, time and situation Is this baseline? Yes   Triage Complete: Triage complete  Chief Complaint syncope  Triage Note Pt had syncopal episode on 2 steps, tenderness on left hip, no deformity. Pt had chest pain when syncope occurred. Unable to bear weight. EKG NSR, 02 88% room air, 2L on nasal cannula. Initial BP 112/40. Occurred at 1600. No fever, cough, SOB   Allergies Allergies  Allergen Reactions  . Rosuvastatin Calcium Other (See Comments)    Myalgias  . Iohexol Hives    pt ok with premedication (Benadryl 50mg  PO), Onset Date: [id number]   . Statins Other (See Comments)    Myalgias     Level of Care/Admitting Diagnosis ED Disposition    ED Disposition Condition [facility] Area: [place] [[id number]]  Level of Care: Telemetry Cardiac [103]  I expect the patient will be discharged within 24 hours: No (not a candidate for 5C-Observation unit)  Covid Evaluation: Screening Protocol (No Symptoms)  Diagnosis: Fracture of left acetabulum [facility]) [867619]  Admitting Physician: Etta Quill [phone]  Attending Physician: Etta Quill [[id number]  PT Class (Do Not Modify): Observation [104]  PT Acc Code (Do Not Modify): Observation [10022]       B Medical/Surgery History Past Medical History:  Diagnosis Date  . CAD (coronary artery disease)    a. 04/2007 s/p CABG x 4  - LIMA->LAD, VG->OM1->Om2, VG->PDA;  b. 12/2008 & 06/2010 Caths - Native multivessel dzs with 4/4 patent grafts. c. cath 03/09/2015 patent grafts, unchanged from prior cath.  . [facility])    pancreatic  March 2011  . Carotid bruit    a. 07/2010 U/S- 0-39% bilat ICA stenosis  . Chest pain    Noncardiac probably related to reflux  . DDD  (degenerative disc disease)    several surgeries  . GERD (gastroesophageal reflux disease)   . HNP (herniated nucleus pulposus), lumbar    a. L2-3, s/p laminotomy, microdiskectomy 02/2009  . HOH (hard of hearing)    uses amplifiers   . Nephrolithiasis   . PAD (peripheral artery disease) ([place]) 04/2014   s/p L Common Iliac Stent  . Palpitations   . 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 Pneumonia    hx of  pna  . Pure hypercholesterolemia    statin intolerance  . Type II or unspecified type diabetes mellitus without mention of complication, not stated as uncontrolled    insulin dependent  . Unspecified essential hypertension    Past Surgical History:  Procedure Laterality Date  . ABDOMINAL AORTAGRAM N/A 05/13/2014   Procedure: ABDOMINAL [name];  Surgeon: Wellington Hampshire, MD;  Location: [place] CATH LAB;  Service: Cardiovascular;  Laterality: N/A;  . ANTERIOR CERVICAL DECOMP/DISCECTOMY FUSION N/A 07/28/2014   Procedure: ANTERIOR CERVICAL DECOMPRESSION/DISCECTOMY FUSION CERVICAL FOUR-FIVE CERVICAL FIVE-SIX ;  Surgeon: Earnie Larsson, MD;  Location: [place] NEURO ORS;  Service: Neurosurgery;  Laterality: N/A;  . APPENDECTOMY    . arthroscopy right knee    . BACK SURGERY    . CARDIAC CATHETERIZATION N/A 03/09/2015   Procedure: Left Heart Cath and Cors/Grafts Angiography;  Surgeon: [name] Sine, MD;  Location: [place] CV LAB;  Service: Cardiovascular;  Laterality: N/A;  . CHOLECYSTECTOMY    . CORONARY ARTERY BYPASS GRAFT     X 4 2009 (LIMA to LAD, SVG to first cicumflex marginal branch with sequential SVG to second cicumflex marginal branch  and saphenous vein to posterior descending coronary artery with endoscopic,vein harvest rt. lower exttremity by Dr.Owen on March 31,2009  . CYST REMOVED     FROM SPINE  . CYSTOURETHROSCOPY     right retrograde pyelogram,manipulate stone in the renal pelvis, rt. double-j catheter.  [name] Kitchen EYE SURGERY    . KNEE  SURGERY     rt knee  . LEFT HEART CATH AND CORS/GRAFTS ANGIOGRAPHY N/A 02/20/2017   Procedure: LEFT HEART CATH AND CORS/GRAFTS ANGIOGRAPHY;  Surgeon: Leonie Man, MD;  Location: [place] CV LAB;  Service: Cardiovascular;  Laterality: N/A;  . LEFT HEART CATH AND CORS/GRAFTS ANGIOGRAPHY N/A 05/28/2018   Procedure: LEFT HEART CATH AND CORS/GRAFTS ANGIOGRAPHY;  Surgeon: Burnell Blanks, MD;  Location: [place] CV LAB;  Service: Cardiovascular;  Laterality: N/A;  . NEPHROLITHOTOMY     PERCUTANEOUS  . PARS PLANA VITRECTOMY     lt. eye,retinal photocoagulation lt. eye, membrane peel lt. eye.  [name] Kitchen PERCUTANEOUS STENT INTERVENTION Left 05/13/2014   Procedure: PERCUTANEOUS STENT INTERVENTION;  Surgeon: Wellington Hampshire, MD;  Location: [place] CATH LAB;  Service: Cardiovascular;  Laterality: Left;  COMMON ILLIAC  . re-exploratin of laminectomy  05/2008   (RT L2-3) WITH REDO MICRODISKECTOMY     A IV Location/Drains/Wounds Patient Lines/Drains/Airways Status   Active Line/Drains/Airways    Name:   Placement date:   Placement time:   Site:   Days:   Peripheral IV 07/08/18 Right Wrist   07/08/18    1946    Wrist   1   Peripheral IV 07/08/18 Right Antecubital   07/08/18    2310    Antecubital   1   Incision (Closed) 07/28/14 Neck Other (Comment)   07/28/14    0940     1442          Intake/Output Last 24 hours No intake or output data in the 24 hours ending 07/09/18 0441  Labs/Imaging Results for orders placed or performed during the hospital encounter of 07/08/18 (from the past 48 hour(s))  Basic metabolic panel     Status: Abnormal   Collection Time: 07/08/18  6:29 PM  Result Value Ref Range   Sodium 134 (L) 135 - 145 mmol/L   Potassium 4.2 3.5 - 5.1 mmol/L   Chloride 99 98 - 111 mmol/L   CO2 21 (L) 22 - 32 mmol/L   Glucose, Bld 189 (H) 70 - 99 mg/dL   BUN 27 (H) 8 - 23 mg/dL   Creatinine, Ser 1.04 0.61 - 1.24 mg/dL   Calcium 9.5 8.9 - 10.3 mg/dL   GFR calc non Af Amer >60 >60  mL/min   GFR calc Af Amer >60 >60 mL/min   Anion gap 14 5 - 15    Comment: Performed at [facility], [place] [address], [place], [place] [address]  CBC     Status: Abnormal   Collection Time: 07/08/18  6:29 PM  Result Value Ref Range   WBC 17.9 (H) 4.0 - 10.5 K/uL   RBC 4.64 4.22 - 5.81 MIL/uL   Hemoglobin 14.5 13.0 - 17.0 g/dL   HCT 43.6 39.0 - 52.0 %   MCV 94.0 80.0 - 100.0 fL   MCH 31.3 26.0 - 34.0 pg   MCHC 33.3 30.0 -  36.0 g/dL   RDW 12.7 11.5 - 15.5 %   Platelets 223 150 - 400 K/uL   nRBC 0.0 0.0 - 0.2 %    Comment: Performed at [facility], [place] [address] [place], [place] [address]  Troponin I - ONCE - STAT     Status: None   Collection Time: 07/08/18  6:29 PM  Result Value Ref Range   Troponin I <0.03 <0.03 ng/mL    Comment: Performed at [facility], [place] [address] [place], [place] [address]  Brain natriuretic peptide     Status: None   Collection Time: 07/08/18  6:29 PM  Result Value Ref Range   B Natriuretic Peptide 37.8 0.0 - 100.0 pg/mL    Comment: Performed at [place] [address], [place], [place] [address]  SARS Coronavirus 2 (CEPHEID - Performed in [place] hospital lab), Hosp Order     Status: None   Collection Time: 07/08/18  9:33 PM  Result Value Ref Range   SARS Coronavirus 2 NEGATIVE NEGATIVE    Comment: (NOTE) If result is NEGATIVE SARS-CoV-2 target nucleic acids are NOT DETECTED. The SARS-CoV-2 RNA is generally detectable in upper and lower  respiratory specimens during the acute phase of infection. The lowest  concentration of SARS-CoV-2 viral copies this assay can detect is 250  copies / mL. A negative result does not preclude SARS-CoV-2 infection  and should not be used as the sole basis for treatment or other  patient management decisions.  A negative result may occur with  improper specimen collection / handling, submission of specimen other  than nasopharyngeal swab, presence of viral mutation(s) within the   areas targeted by this assay, and inadequate number of viral copies  (<250 copies / mL). A negative result must be combined with clinical  observations, patient history, and epidemiological information. If result is POSITIVE SARS-CoV-2 target nucleic acids are DETECTED. The SARS-CoV-2 RNA is generally detectable in upper and lower  respiratory specimens dur ing the acute phase of infection.  Positive  results are indicative of active infection with SARS-CoV-2.  Clinical  correlation with patient history and other diagnostic information is  necessary to determine patient infection status.  Positive results do  not rule out bacterial infection or co-infection with other viruses. If result is PRESUMPTIVE POSTIVE SARS-CoV-2 nucleic acids MAY BE PRESENT.   A presumptive positive result was obtained on the submitted specimen  and confirmed on repeat testing.  While 2019 novel coronavirus  (SARS-CoV-2) nucleic acids may be present in the submitted sample  additional confirmatory testing may be necessary for epidemiological  and / or clinical management purposes  to differentiate between  SARS-CoV-2 and other Sarbecovirus currently known to infect humans.  If clinically indicated additional testing with an alternate test  methodology [phone]) is advised. The SARS-CoV-2 RNA is generally  detectable in upper and lower respiratory sp ecimens during the acute  phase of infection. The expected result is Negative. Fact Sheet for Patients:  StrictlyIdeas.no Fact Sheet for Healthcare Providers: BankingDealers.co.za This test is not yet approved or cleared by the [place] FDA and has been authorized for detection and/or diagnosis of SARS-CoV-2 by FDA under an Emergency Use Authorization (EUA).  This EUA will remain in effect (meaning this test can be used) for the duration of the COVID-19 declaration under Section 564(b)(1) of the Act, 21  U.S.C. section 360bbb-3(b)(1), unless the authorization is terminated or revoked sooner. Performed at [facility], [address], [place], [place] [address]   I-STAT 7, (LYTES, BLD GAS, ICA, H+H)     Status: Abnormal   Collection Time: 07/08/18 10:15 PM  Result Value Ref Range   pH, Arterial 7.417 7.350 - 7.450   pCO2 arterial 44.5 32.0 - 48.0 mmHg   pO2, Arterial 74.0 (L) 83.0 - 108.0 mmHg   Bicarbonate 28.7 (H) 20.0 - 28.0 mmol/L   TCO2 30 22 - 32 mmol/L   O2 Saturation 95.0 %   Acid-Base Excess 3.0 (H) 0.0 - 2.0 mmol/L   Sodium 134 (L) 135 - 145 mmol/L   Potassium 3.8 3.5 - 5.1 mmol/L   Calcium, Ion 1.27 1.15 - 1.40 mmol/L   HCT 42.0 39.0 - 52.0 %   Hemoglobin 14.3 13.0 - 17.0 g/dL   Patient temperature 98.1 F    Collection site RADIAL, ALLEN'S TEST ACCEPTABLE    Drawn by Operator    Sample type ARTERIAL   Urinalysis, Routine w reflex microscopic     Status: None   Collection Time: 07/08/18 10:17 PM  Result Value Ref Range   Color, Urine YELLOW YELLOW   APPearance CLEAR CLEAR   Specific Gravity, Urine 1.018 1.005 - 1.030   pH 6.0 5.0 - 8.0   Glucose, UA NEGATIVE NEGATIVE mg/dL   Hgb urine dipstick NEGATIVE NEGATIVE   Bilirubin Urine NEGATIVE NEGATIVE   Ketones, ur NEGATIVE NEGATIVE mg/dL   Protein, ur NEGATIVE NEGATIVE mg/dL   Nitrite NEGATIVE NEGATIVE   Leukocytes,Ua NEGATIVE NEGATIVE    Comment: Performed at [facility], [place] [address] [place], [place] [address]  Troponin I - ONCE - STAT     Status: Abnormal   Collection Time: 07/08/18 11:46 PM  Result Value Ref Range   Troponin I 0.03 (HH) <0.03 ng/mL    Comment: CRITICAL RESULT CALLED TO, READ BACK BY AND VERIFIED WITH: Rocco Serene C,RN 07/09/18 0108 WAYK Performed at [facility], [place] [address] [place], [place] [address]   Troponin I - ONCE - STAT     Status: Abnormal   Collection Time: 07/09/18  3:32 AM  Result Value Ref Range   Troponin I 0.13 (HH) <0.03 ng/mL    Comment: CRITICAL  VALUE NOTED.  VALUE IS CONSISTENT WITH PREVIOUSLY REPORTED AND CALLED VALUE. Performed at [facility], [place] [address], [place], [place] [address]    Dg Chest 1 View  Result Date: 07/08/2018 CLINICAL DATA:  Fall from ladder with chest pain, initial encounter EXAM: CHEST  1 VIEW COMPARISON:  07/04/2018 FINDINGS: Cardiac shadow is within normal limits. Postsurgical changes. The lungs are well aerated with mild left basilar atelectasis. No focal infiltrate is seen. No acute bony is noted. IMPRESSION: Mild left basilar atelectasis. Electronically Signed   By: Inez Catalina M.D.   On: 07/08/2018 21:02   Ct Angio Chest Pe W And/or Wo Contrast  Result Date: 07/09/2018 CLINICAL DATA:  Initial evaluation for acute chest pain, abdominal pain. EXAM: CT ANGIOGRAPHY CHEST CT ABDOMEN AND PELVIS WITH CONTRAST TECHNIQUE: Multidetector CT imaging of the chest was performed using the standard protocol during bolus administration of intravenous contrast. Multiplanar CT image reconstructions and MIPs were obtained to evaluate the vascular anatomy. Multidetector CT imaging of the abdomen and pelvis was performed using the standard protocol during bolus administration of intravenous contrast. CONTRAST:  118mL OMNIPAQUE IOHEXOL 350 MG/ML SOLN COMPARISON:  Prior CT from 03/01/2018 FINDINGS: CTA CHEST FINDINGS Cardiovascular: Intrathoracic aorta of normal caliber without aneurysm or  other acute abnormality. Moderate aortic atherosclerosis. Visualized great vessels tortuous but within normal limits. Mild cardiomegaly no pericardial effusion. Pulmonary arterial tree adequately opacified for evaluation. Main pulmonary artery dilated up to 3.4 cm, suggesting underlying pulmonary hypertension. No filling defect to suggest acute pulmonary embolism. Re-formatted imaging confirms these findings. Mediastinum/Nodes: Visualized thyroid within normal limits. No enlarged mediastinal, hilar, or axillary lymph nodes. Esophagus within normal  limits. Lungs/Pleura: Tracheobronchial tree intact and patent. Diffuse central airway thickening noted. Lungs hypoinflated bilaterally with elevation of the hemidiaphragms. Trace layering bilateral pleural effusions, left slightly larger than right. Associated bibasilar atelectasis. No other focal infiltrates. No pulmonary edema. No pneumothorax. Few scattered subpleural subcentimeter nodular densities measuring up to 5 mm noted within the peripheral upper lobes bilaterally, indeterminate. Musculoskeletal: Median sternotomy wires noted. No acute osseous abnormality. No worrisome lytic or blastic osseous lesions. Review of the MIP images confirms the above findings. CT ABDOMEN and PELVIS FINDINGS Hepatobiliary: Liver demonstrates a normal contrast enhanced appearance. Punctate granuloma noted within the caudate lobe. Gallbladder surgically absent. No biliary dilatation. Pancreas: Sequelae of prior distal pancreatectomy noted. Pancreas otherwise unremarkable. Spleen: Spleen is absent. Adrenals/Urinary Tract: Adrenal glands within normal limits. Kidneys equal in size with symmetric enhancement. Focal cortical thinning and scarring noted at the lower pole the right kidney. 6 mm nonobstructive stone present at the lower pole the right kidney. No other radiopaque calculi. No hydronephrosis or hydroureter. No focal enhancing renal mass. Partially distended bladder within normal limits. Stomach/Bowel: Stomach decompressed without acute finding. No evidence for bowel obstruction. Large volume retained stool throughout the colon, suggesting constipation. Negative appendix. No acute inflammatory changes seen about the bowels. Vascular/Lymphatic: Advanced aorto bi-iliac atherosclerotic disease. No aneurysm. Mesenteric vessels patent proximally. Two right-sided renal arteries noted. Retroaortic left renal vein noted as well. No adenopathy. Reproductive: Enlarged prostate measuring 6.3 cm in transverse diameter. Other: No free  air or fluid. Musculoskeletal: Additional views of the left hip demonstrate an acute nondisplaced fracture through the superior and posterior aspect of the left acetabulum (series 6, image 25). Subtle medial extension towards the iliopectineal line (series 10, image 42). Anterior column intact. Left femoral head remains normally positioned and remains intact. Femoral neck and visualized proximal femur intact. Remainder of the visualized pelvis intact. No pubic diastasis. SI joints approximated. No other acute osseous abnormality. No discrete lytic or blastic osseous lesions. Patient status post posterior decompression at L2 through L4. Moderate to advanced multilevel degenerative spondylolysis throughout the lumbar spine. Review of the MIP images confirms the above findings. IMPRESSION: CTA CHEST IMPRESSION 1. No CT evidence for acute pulmonary embolism. 2. Low lung volumes with associated trace layering bilateral pleural effusions and bibasilar atelectasis. No other active cardiopulmonary disease. 3. Central airway thickening with dilatation of the main pulmonary artery up to 3.4 cm, suggesting a degree of underlying pulmonary hypertension. CT ABDOMEN AND PELVIS IMPRESSION 1. Acute nondisplaced left acetabular fracture as above. 2. No other acute abnormality within the abdomen and pelvis. 3. 6 mm nonobstructive right renal nephrolithiasis. 4. Large volume retained stool throughout the colon, suggesting constipation. 5. Advanced atherosclerosis with diffuse 3 vessel coronary artery calcifications. Electronically Signed   By: Jeannine Boga M.D.   On: 07/09/2018 03:35   Ct Abdomen Pelvis W Contrast  Result Date: 07/09/2018 CLINICAL DATA:  Initial evaluation for acute chest pain, abdominal pain. EXAM: CT ANGIOGRAPHY CHEST CT ABDOMEN AND PELVIS WITH CONTRAST TECHNIQUE: Multidetector CT imaging of the chest was performed using the standard protocol during bolus administration of intravenous  contrast.  Multiplanar CT image reconstructions and MIPs were obtained to evaluate the vascular anatomy. Multidetector CT imaging of the abdomen and pelvis was performed using the standard protocol during bolus administration of intravenous contrast. CONTRAST:  159mL OMNIPAQUE IOHEXOL 350 MG/ML SOLN COMPARISON:  Prior CT from 03/01/2018 FINDINGS: CTA CHEST FINDINGS Cardiovascular: Intrathoracic aorta of normal caliber without aneurysm or other acute abnormality. Moderate aortic atherosclerosis. Visualized great vessels tortuous but within normal limits. Mild cardiomegaly no pericardial effusion. Pulmonary arterial tree adequately opacified for evaluation. Main pulmonary artery dilated up to 3.4 cm, suggesting underlying pulmonary hypertension. No filling defect to suggest acute pulmonary embolism. Re-formatted imaging confirms these findings. Mediastinum/Nodes: Visualized thyroid within normal limits. No enlarged mediastinal, hilar, or axillary lymph nodes. Esophagus within normal limits. Lungs/Pleura: Tracheobronchial tree intact and patent. Diffuse central airway thickening noted. Lungs hypoinflated bilaterally with elevation of the hemidiaphragms. Trace layering bilateral pleural effusions, left slightly larger than right. Associated bibasilar atelectasis. No other focal infiltrates. No pulmonary edema. No pneumothorax. Few scattered subpleural subcentimeter nodular densities measuring up to 5 mm noted within the peripheral upper lobes bilaterally, indeterminate. Musculoskeletal: Median sternotomy wires noted. No acute osseous abnormality. No worrisome lytic or blastic osseous lesions. Review of the MIP images confirms the above findings. CT ABDOMEN and PELVIS FINDINGS Hepatobiliary: Liver demonstrates a normal contrast enhanced appearance. Punctate granuloma noted within the caudate lobe. Gallbladder surgically absent. No biliary dilatation. Pancreas: Sequelae of prior distal pancreatectomy noted. Pancreas otherwise  unremarkable. Spleen: Spleen is absent. Adrenals/Urinary Tract: Adrenal glands within normal limits. Kidneys equal in size with symmetric enhancement. Focal cortical thinning and scarring noted at the lower pole the right kidney. 6 mm nonobstructive stone present at the lower pole the right kidney. No other radiopaque calculi. No hydronephrosis or hydroureter. No focal enhancing renal mass. Partially distended bladder within normal limits. Stomach/Bowel: Stomach decompressed without acute finding. No evidence for bowel obstruction. Large volume retained stool throughout the colon, suggesting constipation. Negative appendix. No acute inflammatory changes seen about the bowels. Vascular/Lymphatic: Advanced aorto bi-iliac atherosclerotic disease. No aneurysm. Mesenteric vessels patent proximally. Two right-sided renal arteries noted. Retroaortic left renal vein noted as well. No adenopathy. Reproductive: Enlarged prostate measuring 6.3 cm in transverse diameter. Other: No free air or fluid. Musculoskeletal: Additional views of the left hip demonstrate an acute nondisplaced fracture through the superior and posterior aspect of the left acetabulum (series 6, image 25). Subtle medial extension towards the iliopectineal line (series 10, image 42). Anterior column intact. Left femoral head remains normally positioned and remains intact. Femoral neck and visualized proximal femur intact. Remainder of the visualized pelvis intact. No pubic diastasis. SI joints approximated. No other acute osseous abnormality. No discrete lytic or blastic osseous lesions. Patient status post posterior decompression at L2 through L4. Moderate to advanced multilevel degenerative spondylolysis throughout the lumbar spine. Review of the MIP images confirms the above findings. IMPRESSION: CTA CHEST IMPRESSION 1. No CT evidence for acute pulmonary embolism. 2. Low lung volumes with associated trace layering bilateral pleural effusions and bibasilar  atelectasis. No other active cardiopulmonary disease. 3. Central airway thickening with dilatation of the main pulmonary artery up to 3.4 cm, suggesting a degree of underlying pulmonary hypertension. CT ABDOMEN AND PELVIS IMPRESSION 1. Acute nondisplaced left acetabular fracture as above. 2. No other acute abnormality within the abdomen and pelvis. 3. 6 mm nonobstructive right renal nephrolithiasis. 4. Large volume retained stool throughout the colon, suggesting constipation. 5. Advanced atherosclerosis with diffuse 3 vessel coronary artery calcifications. Electronically Signed  By: Jeannine Boga M.D.   On: 07/09/2018 03:35   Ct No Charge  Result Date: 07/09/2018 CLINICAL DATA:  Initial evaluation for acute chest pain, abdominal pain. EXAM: CT ANGIOGRAPHY CHEST CT ABDOMEN AND PELVIS WITH CONTRAST TECHNIQUE: Multidetector CT imaging of the chest was performed using the standard protocol during bolus administration of intravenous contrast. Multiplanar CT image reconstructions and MIPs were obtained to evaluate the vascular anatomy. Multidetector CT imaging of the abdomen and pelvis was performed using the standard protocol during bolus administration of intravenous contrast. CONTRAST:  124mL OMNIPAQUE IOHEXOL 350 MG/ML SOLN COMPARISON:  Prior CT from 03/01/2018 FINDINGS: CTA CHEST FINDINGS Cardiovascular: Intrathoracic aorta of normal caliber without aneurysm or other acute abnormality. Moderate aortic atherosclerosis. Visualized great vessels tortuous but within normal limits. Mild cardiomegaly no pericardial effusion. Pulmonary arterial tree adequately opacified for evaluation. Main pulmonary artery dilated up to 3.4 cm, suggesting underlying pulmonary hypertension. No filling defect to suggest acute pulmonary embolism. Re-formatted imaging confirms these findings. Mediastinum/Nodes: Visualized thyroid within normal limits. No enlarged mediastinal, hilar, or axillary lymph nodes. Esophagus within normal  limits. Lungs/Pleura: Tracheobronchial tree intact and patent. Diffuse central airway thickening noted. Lungs hypoinflated bilaterally with elevation of the hemidiaphragms. Trace layering bilateral pleural effusions, left slightly larger than right. Associated bibasilar atelectasis. No other focal infiltrates. No pulmonary edema. No pneumothorax. Few scattered subpleural subcentimeter nodular densities measuring up to 5 mm noted within the peripheral upper lobes bilaterally, indeterminate. Musculoskeletal: Median sternotomy wires noted. No acute osseous abnormality. No worrisome lytic or blastic osseous lesions. Review of the MIP images confirms the above findings. CT ABDOMEN and PELVIS FINDINGS Hepatobiliary: Liver demonstrates a normal contrast enhanced appearance. Punctate granuloma noted within the caudate lobe. Gallbladder surgically absent. No biliary dilatation. Pancreas: Sequelae of prior distal pancreatectomy noted. Pancreas otherwise unremarkable. Spleen: Spleen is absent. Adrenals/Urinary Tract: Adrenal glands within normal limits. Kidneys equal in size with symmetric enhancement. Focal cortical thinning and scarring noted at the lower pole the right kidney. 6 mm nonobstructive stone present at the lower pole the right kidney. No other radiopaque calculi. No hydronephrosis or hydroureter. No focal enhancing renal mass. Partially distended bladder within normal limits. Stomach/Bowel: Stomach decompressed without acute finding. No evidence for bowel obstruction. Large volume retained stool throughout the colon, suggesting constipation. Negative appendix. No acute inflammatory changes seen about the bowels. Vascular/Lymphatic: Advanced aorto bi-iliac atherosclerotic disease. No aneurysm. Mesenteric vessels patent proximally. Two right-sided renal arteries noted. Retroaortic left renal vein noted as well. No adenopathy. Reproductive: Enlarged prostate measuring 6.3 cm in transverse diameter. Other: No free  air or fluid. Musculoskeletal: Additional views of the left hip demonstrate an acute nondisplaced fracture through the superior and posterior aspect of the left acetabulum (series 6, image 25). Subtle medial extension towards the iliopectineal line (series 10, image 42). Anterior column intact. Left femoral head remains normally positioned and remains intact. Femoral neck and visualized proximal femur intact. Remainder of the visualized pelvis intact. No pubic diastasis. SI joints approximated. No other acute osseous abnormality. No discrete lytic or blastic osseous lesions. Patient status post posterior decompression at L2 through L4. Moderate to advanced multilevel degenerative spondylolysis throughout the lumbar spine. Review of the MIP images confirms the above findings. IMPRESSION: CTA CHEST IMPRESSION 1. No CT evidence for acute pulmonary embolism. 2. Low lung volumes with associated trace layering bilateral pleural effusions and bibasilar atelectasis. No other active cardiopulmonary disease. 3. Central airway thickening with dilatation of the main pulmonary artery up to 3.4 cm, suggesting a  degree of underlying pulmonary hypertension. CT ABDOMEN AND PELVIS IMPRESSION 1. Acute nondisplaced left acetabular fracture as above. 2. No other acute abnormality within the abdomen and pelvis. 3. 6 mm nonobstructive right renal nephrolithiasis. 4. Large volume retained stool throughout the colon, suggesting constipation. 5. Advanced atherosclerosis with diffuse 3 vessel coronary artery calcifications. Electronically Signed   By: Jeannine Boga M.D.   On: 07/09/2018 03:35   Dg Hip Unilat W Or Wo Pelvis 2-3 Views Left  Result Date: 07/08/2018 CLINICAL DATA:  Recent fall from ladder with left hip pain, initial encounter EXAM: DG HIP (WITH OR WITHOUT PELVIS) 3V LEFT COMPARISON:  05/19/2014 FINDINGS: Pelvic ring is intact. Degenerative changes of lumbar spine and hip joints are noted. No acute fractures noted.  IMPRESSION: No fracture identified. Electronically Signed   By: Inez Catalina M.D.   On: 07/08/2018 21:03    Pending Labs Unresulted Labs (From admission, onward)   None      Vitals/Pain Today's Vitals   07/09/18 0100 07/09/18 0145 07/09/18 0230 07/09/18 0406  BP: (!) 152/67 130/60 (!) 172/72 (!) 146/70  Pulse: 80 67 87 84  Resp: (!) 32 17 (!) 27 (!) 21  Temp:      TempSrc:      SpO2: 97% 97% 96% 96%  PainSc:        Isolation Precautions No active isolations  Medications Medications  iohexol (OMNIPAQUE) 350 MG/ML injection 100 mL (has no administration in time range)  aspirin chewable tablet 324 mg (has no administration in time range)  nitroGLYCERIN (NITROSTAT) SL tablet 0.4 mg (has no administration in time range)  insulin glargine (LANTUS) injection 15 Units (has no administration in time range)  multivitamin with minerals tablet 1 tablet (has no administration in time range)  ranolazine (RANEXA) 12 hr tablet 500 mg (has no administration in time range)  amLODipine (NORVASC) tablet 10 mg (has no administration in time range)  acetaminophen (TYLENOL) tablet 650 mg (has no administration in time range)  aspirin EC tablet 81 mg (has no administration in time range)  atorvastatin (LIPITOR) tablet 20 mg (has no administration in time range)  pantoprazole (PROTONIX) EC tablet 80 mg (has no administration in time range)  gabapentin (NEURONTIN) capsule 100 mg (has no administration in time range)  tamsulosin (FLOMAX) capsule 0.4 mg (has no administration in time range)  insulin aspart (novoLOG) injection 0-9 Units (has no administration in time range)  lisinopril (ZESTRIL) tablet 20 mg (has no administration in time range)    And  hydrochlorothiazide (MICROZIDE) capsule 12.5 mg (has no administration in time range)  ondansetron (ZOFRAN) tablet 4 mg (has no administration in time range)    Or  ondansetron (ZOFRAN) injection 4 mg (has no administration in time range)  enoxaparin  (LOVENOX) injection 40 mg (has no administration in time range)  docusate sodium (COLACE) capsule 100 mg (has no administration in time range)  sorbitol 70 % solution 30 mL (has no administration in time range)  morphine 2 MG/ML injection 2-4 mg (has no administration in time range)  senna (SENOKOT) tablet 8.6 mg (has no administration in time range)  sodium chloride flush (NS) 0.9 % injection 3 mL (3 mLs Intravenous Given 07/08/18 1946)  fentaNYL (SUBLIMAZE) injection 50 mcg (50 mcg Intravenous Given 07/08/18 1947)  hydrocortisone sodium succinate (SOLU-CORTEF) injection 200 mg (200 mg Intravenous Given 07/08/18 2142)  diphenhydrAMINE (BENADRYL) capsule 50 mg ( Oral See Alternative 07/09/18 0043)    Or  diphenhydrAMINE (BENADRYL) injection 50 mg (50  mg Intravenous Given 07/09/18 0043)  morphine 4 MG/ML injection 4 mg (4 mg Intravenous Given 07/08/18 2144)  iohexol (OMNIPAQUE) 350 MG/ML injection 100 mL (100 mLs Intravenous Contrast Given 07/09/18 0202)    Mobility walks High fall risk   Focused Assessments hip pain after fall. intermittent chest pain (ongoing-has seen cardiology for the same)   R Recommendations: See Admitting Provider Note  Report given to:   Additional Notes:ortho to see

## 2018-07-10 ENCOUNTER — Encounter (HOSPITAL_COMMUNITY): Payer: Self-pay | Admitting: General Practice

## 2018-07-10 ENCOUNTER — Other Ambulatory Visit: Payer: Self-pay

## 2018-07-10 DIAGNOSIS — R0600 Dyspnea, unspecified: Secondary | ICD-10-CM

## 2018-07-10 LAB — GLUCOSE, CAPILLARY
Glucose-Capillary: 193 mg/dL — ABNORMAL HIGH (ref 70–99)
Glucose-Capillary: 207 mg/dL — ABNORMAL HIGH (ref 70–99)
Glucose-Capillary: 267 mg/dL — ABNORMAL HIGH (ref 70–99)
Glucose-Capillary: 216 mg/dL — ABNORMAL HIGH (ref 70–99)

## 2018-07-10 MED ORDER — POLYETHYLENE GLYCOL 3350 17 G PO PACK: 17.0000 g | PACK | Freq: Every day | ORAL | Status: DC

## 2018-07-10 MED ORDER — INSULIN ASPART 100 UNIT/ML ~~LOC~~ SOLN
2.0000 [IU] | Freq: Every day | SUBCUTANEOUS | Status: DC
  Administered 2018-07-10: 2 [IU] via SUBCUTANEOUS

## 2018-07-10 MED ORDER — HEPARIN SODIUM (PORCINE) 5000 UNIT/ML IJ SOLN
5000.0000 [IU] | Freq: Three times a day (TID) | INTRAMUSCULAR | Status: DC
  Administered 2018-07-10 – 2018-07-11 (×4): 5000 [IU] via SUBCUTANEOUS
  Filled 2018-07-10 (×4): qty 1

## 2018-07-10 MED ORDER — POLYETHYLENE GLYCOL 3350 17 G PO PACK: 17.0000 g | PACK | Freq: Every day | ORAL | Status: DC | PRN

## 2018-07-10 NOTE — Progress Notes (Signed)
[name]  PROGRESS NOTE    Ricardo Le  ZDG:[id number] DOB: 1940/12/27 DOA: 07/08/2018 PCP: Angelina Sheriff, MD   Brief Narrative:   Ricardo Le is a 78 y.o. male with medical history significant of DM2, HTN, CAD s/p CABG, multiple months of CP, anginal with cath on 4/14 showing severe distal disease from grafts but not amenable to stenting and stable since Jan 2019, medical management was recommended.  Today he fell down 1 step off of a ladder, landed on buttocks.  L hip pain and inability to bear weight following fall.   Assessment & Plan:   Principal Problem:   Fracture of left acetabulum (HCC) Active Problems:   Type 2 diabetes mellitus with vascular disease (HCC)   Essential hypertension   Constipation   S/P CABG x 4   Coronary artery disease involving native coronary artery of native heart with unstable angina pectoris (HCC)   Elevated troponin   1. Fx of left acetabulum     - per ortho: Non-operative management, Mobilize w/ therapy, TDWB LLE; Follow up in the office with Dr. Alain Marion in 2 weeks.  Please call with questions.     - Morphine PRN pain     - eval by PT  2. Unstable Angina     - Serial trops     - per cards: Elevated troponin due to demand ischemia in setting of acute hip fracture. No angina or Ecg changes. Coronary/graft anatomy known from cardiac cath one month ago. Plan to continue medical therapy as he was on pre op. Follow up with Dr Johnsie Cancel as outpatient. [facility] will sign off.Medication Recommendations:  continue pre admission cardiac meds. Other recommendations (labs, testing, etc): none Follow up as an outpatient:  with Dr. Johnsie Cancel     - Tele monitor     - Cont home atorvastatin, ASA     - SL NTG PRN  3. Constipation     - Scheduled Senna and docusate     - PRN sorbitol, miralax  4. DM2     - Lantus 15units qHS; 2units novolog qHS     - Sensitive SSI Q4H  5. HTN      - continue amlodipine, lisinopril   DVT prophylaxis:  heparin Code Status: FULL   Disposition Plan: TBD   Consultants:   Orthopedics  Cardiology    Subjective: "I think i'm constipated"  Objective: Vitals:   07/09/18 1123 07/09/18 2018 07/09/18 2300 07/10/18 0426  BP: 132/64 135/60 138/68 138/64  Pulse: 75 64 68 65  Resp: 18  18   Temp: 98.5 F (36.9 C) 98 F (36.7 C)  98.7 F (37.1 C)  TempSrc: Oral   Oral  SpO2: 95% 93% 94% 93%  Weight:    79.5 kg  Height:        Intake/Output Summary (Last 24 hours) at 07/10/2018 1158 Last data filed at 07/10/2018 1015 Gross per 24 hour  Intake 360 ml  Output 1825 ml  Net -1465 ml   Filed Weights   07/09/18 0500 07/09/18 0552 07/10/18 0426  Weight: 72.9 kg 74.8 kg 79.5 kg    Examination:  General exam: 78 y.o. male Appears calm and comfortable  Respiratory system: Clear to auscultation. Respiratory effort normal. Cardiovascular system: S1 & S2 heard, RRR. No JVD, murmurs, rubs, gallops or clicks. No pedal edema. Gastrointestinal system: Abdomen is nondistended, soft and nontender. No organomegaly or masses felt. Normal bowel sounds heard. Central nervous system: Alert and oriented.  No focal neurological deficits. Skin: No rashes, lesions or ulcers Psychiatry: Judgement and insight appear normal. Mood & affect appropriate.     Data Reviewed: I have personally reviewed following labs and imaging studies.  CBC: Recent Labs  Lab 07/04/18 1311 07/08/18 1829 07/08/18 2215  WBC 9.6 17.9*  --   NEUTROABS 5.6  --   --   HGB 14.8 14.5 14.3  HCT 42.4 43.6 42.0  MCV 94 94.0  --   PLT 279 223  --    Basic Metabolic Panel: Recent Labs  Lab 07/04/18 1311 07/08/18 1829 07/08/18 2215  NA 138 134* 134*  K 4.7 4.2 3.8  CL 95* 99  --   CO2 27 21*  --   GLUCOSE 214* 189*  --   BUN 21 27*  --   CREATININE 0.89 1.04  --   CALCIUM 9.9 9.5  --    GFR: Estimated Creatinine Clearance: 58.7 mL/min (by C-G formula based on SCr of 1.04 mg/dL). Liver Function Tests: No results  for input(s): AST, ALT, ALKPHOS, BILITOT, PROT, ALBUMIN in the last 168 hours. No results for input(s): LIPASE, AMYLASE in the last 168 hours. No results for input(s): AMMONIA in the last 168 hours. Coagulation Profile: No results for input(s): INR, PROTIME in the last 168 hours. Cardiac Enzymes: Recent Labs  Lab 07/08/18 2346 07/09/18 0332 07/09/18 0703 07/09/18 1255 07/09/18 1824  TROPONINI 0.03* 0.13* 0.28* 0.99* 0.97*   BNP (last 3 results) Recent Labs    07/04/18 1311  PROBNP 137   HbA1C: Recent Labs    07/09/18 1036  HGBA1C 7.3*   CBG: Recent Labs  Lab 07/09/18 1214 07/09/18 1656 07/09/18 2321 07/10/18 0610 07/10/18 1119  GLUCAP 261* 236* 262* 193* 207*   Lipid Profile: No results for input(s): CHOL, HDL, LDLCALC, TRIG, CHOLHDL, LDLDIRECT in the last 72 hours. Thyroid Function Tests: No results for input(s): TSH, T4TOTAL, FREET4, T3FREE, THYROIDAB in the last 72 hours. Anemia Panel: No results for input(s): VITAMINB12, FOLATE, FERRITIN, TIBC, IRON, RETICCTPCT in the last 72 hours. Sepsis Labs: No results for input(s): PROCALCITON, LATICACIDVEN in the last 168 hours.  Recent Results (from the past 240 hour(s))  SARS Coronavirus 2 (CEPHEID - Performed in [facility] hospital lab), Hosp Order     Status: None   Collection Time: 07/08/18  9:33 PM  Result Value Ref Range Status   SARS Coronavirus 2 NEGATIVE NEGATIVE Final    Comment: (NOTE) If result is NEGATIVE SARS-CoV-2 target nucleic acids are NOT DETECTED. The SARS-CoV-2 RNA is generally detectable in upper and lower  respiratory specimens during the acute phase of infection. The lowest  concentration of SARS-CoV-2 viral copies this assay can detect is 250  copies / mL. A negative result does not preclude SARS-CoV-2 infection  and should not be used as the sole basis for treatment or other  patient management decisions.  A negative result may occur with  improper specimen collection / handling,  submission of specimen other  than nasopharyngeal swab, presence of viral mutation(s) within the  areas targeted by this assay, and inadequate number of viral copies  (<250 copies / mL). A negative result must be combined with clinical  observations, patient history, and epidemiological information. If result is POSITIVE SARS-CoV-2 target nucleic acids are DETECTED. The SARS-CoV-2 RNA is generally detectable in upper and lower  respiratory specimens dur ing the acute phase of infection.  Positive  results are indicative of active infection with SARS-CoV-2.  Clinical  correlation  with patient history and other diagnostic information is  necessary to determine patient infection status.  Positive results do  not rule out bacterial infection or co-infection with other viruses. If result is PRESUMPTIVE POSTIVE SARS-CoV-2 nucleic acids MAY BE PRESENT.   A presumptive positive result was obtained on the submitted specimen  and confirmed on repeat testing.  While 2019 novel coronavirus  (SARS-CoV-2) nucleic acids may be present in the submitted sample  additional confirmatory testing may be necessary for epidemiological  and / or clinical management purposes  to differentiate between  SARS-CoV-2 and other Sarbecovirus currently known to infect humans.  If clinically indicated additional testing with an alternate test  methodology [phone]) is advised. The SARS-CoV-2 RNA is generally  detectable in upper and lower respiratory sp ecimens during the acute  phase of infection. The expected result is Negative. Fact Sheet for Patients:  StrictlyIdeas.no Fact Sheet for Healthcare Providers: BankingDealers.co.za This test is not yet approved or cleared by the [place] FDA and has been authorized for detection and/or diagnosis of SARS-CoV-2 by FDA under an Emergency Use Authorization (EUA).  This EUA will remain in effect (meaning this test can be  used) for the duration of the COVID-19 declaration under Section 564(b)(1) of the Act, 21 U.S.C. section 360bbb-3(b)(1), unless the authorization is terminated or revoked sooner. Performed at [facility], [place] [address], [place], [place] [address]          Radiology Studies: Dg Chest 1 View  Result Date: 07/08/2018 CLINICAL DATA:  Fall from ladder with chest pain, initial encounter EXAM: CHEST  1 VIEW COMPARISON:  07/04/2018 FINDINGS: Cardiac shadow is within normal limits. Postsurgical changes. The lungs are well aerated with mild left basilar atelectasis. No focal infiltrate is seen. No acute bony is noted. IMPRESSION: Mild left basilar atelectasis. Electronically Signed   By: Inez Catalina M.D.   On: 07/08/2018 21:02   Ct Angio Chest Pe W And/or Wo Contrast  Result Date: 07/09/2018 CLINICAL DATA:  Initial evaluation for acute chest pain, abdominal pain. EXAM: CT ANGIOGRAPHY CHEST CT ABDOMEN AND PELVIS WITH CONTRAST TECHNIQUE: Multidetector CT imaging of the chest was performed using the standard protocol during bolus administration of intravenous contrast. Multiplanar CT image reconstructions and MIPs were obtained to evaluate the vascular anatomy. Multidetector CT imaging of the abdomen and pelvis was performed using the standard protocol during bolus administration of intravenous contrast. CONTRAST:  183mL OMNIPAQUE IOHEXOL 350 MG/ML SOLN COMPARISON:  Prior CT from 03/01/2018 FINDINGS: CTA CHEST FINDINGS Cardiovascular: Intrathoracic aorta of normal caliber without aneurysm or other acute abnormality. Moderate aortic atherosclerosis. Visualized great vessels tortuous but within normal limits. Mild cardiomegaly no pericardial effusion. Pulmonary arterial tree adequately opacified for evaluation. Main pulmonary artery dilated up to 3.4 cm, suggesting underlying pulmonary hypertension. No filling defect to suggest acute pulmonary embolism. Re-formatted imaging confirms these findings.  Mediastinum/Nodes: Visualized thyroid within normal limits. No enlarged mediastinal, hilar, or axillary lymph nodes. Esophagus within normal limits. Lungs/Pleura: Tracheobronchial tree intact and patent. Diffuse central airway thickening noted. Lungs hypoinflated bilaterally with elevation of the hemidiaphragms. Trace layering bilateral pleural effusions, left slightly larger than right. Associated bibasilar atelectasis. No other focal infiltrates. No pulmonary edema. No pneumothorax. Few scattered subpleural subcentimeter nodular densities measuring up to 5 mm noted within the peripheral upper lobes bilaterally, indeterminate. Musculoskeletal: Median sternotomy wires noted. No acute osseous abnormality. No worrisome lytic or blastic osseous lesions. Review of the MIP images confirms the above findings. CT ABDOMEN and PELVIS  FINDINGS Hepatobiliary: Liver demonstrates a normal contrast enhanced appearance. Punctate granuloma noted within the caudate lobe. Gallbladder surgically absent. No biliary dilatation. Pancreas: Sequelae of prior distal pancreatectomy noted. Pancreas otherwise unremarkable. Spleen: Spleen is absent. Adrenals/Urinary Tract: Adrenal glands within normal limits. Kidneys equal in size with symmetric enhancement. Focal cortical thinning and scarring noted at the lower pole the right kidney. 6 mm nonobstructive stone present at the lower pole the right kidney. No other radiopaque calculi. No hydronephrosis or hydroureter. No focal enhancing renal mass. Partially distended bladder within normal limits. Stomach/Bowel: Stomach decompressed without acute finding. No evidence for bowel obstruction. Large volume retained stool throughout the colon, suggesting constipation. Negative appendix. No acute inflammatory changes seen about the bowels. Vascular/Lymphatic: Advanced aorto bi-iliac atherosclerotic disease. No aneurysm. Mesenteric vessels patent proximally. Two right-sided renal arteries noted.  Retroaortic left renal vein noted as well. No adenopathy. Reproductive: Enlarged prostate measuring 6.3 cm in transverse diameter. Other: No free air or fluid. Musculoskeletal: Additional views of the left hip demonstrate an acute nondisplaced fracture through the superior and posterior aspect of the left acetabulum (series 6, image 25). Subtle medial extension towards the iliopectineal line (series 10, image 42). Anterior column intact. Left femoral head remains normally positioned and remains intact. Femoral neck and visualized proximal femur intact. Remainder of the visualized pelvis intact. No pubic diastasis. SI joints approximated. No other acute osseous abnormality. No discrete lytic or blastic osseous lesions. Patient status post posterior decompression at L2 through L4. Moderate to advanced multilevel degenerative spondylolysis throughout the lumbar spine. Review of the MIP images confirms the above findings. IMPRESSION: CTA CHEST IMPRESSION 1. No CT evidence for acute pulmonary embolism. 2. Low lung volumes with associated trace layering bilateral pleural effusions and bibasilar atelectasis. No other active cardiopulmonary disease. 3. Central airway thickening with dilatation of the main pulmonary artery up to 3.4 cm, suggesting a degree of underlying pulmonary hypertension. CT ABDOMEN AND PELVIS IMPRESSION 1. Acute nondisplaced left acetabular fracture as above. 2. No other acute abnormality within the abdomen and pelvis. 3. 6 mm nonobstructive right renal nephrolithiasis. 4. Large volume retained stool throughout the colon, suggesting constipation. 5. Advanced atherosclerosis with diffuse 3 vessel coronary artery calcifications. Electronically Signed   By: Jeannine Boga M.D.   On: 07/09/2018 03:35   Ct Abdomen Pelvis W Contrast  Result Date: 07/09/2018 CLINICAL DATA:  Initial evaluation for acute chest pain, abdominal pain. EXAM: CT ANGIOGRAPHY CHEST CT ABDOMEN AND PELVIS WITH CONTRAST  TECHNIQUE: Multidetector CT imaging of the chest was performed using the standard protocol during bolus administration of intravenous contrast. Multiplanar CT image reconstructions and MIPs were obtained to evaluate the vascular anatomy. Multidetector CT imaging of the abdomen and pelvis was performed using the standard protocol during bolus administration of intravenous contrast. CONTRAST:  176mL OMNIPAQUE IOHEXOL 350 MG/ML SOLN COMPARISON:  Prior CT from 03/01/2018 FINDINGS: CTA CHEST FINDINGS Cardiovascular: Intrathoracic aorta of normal caliber without aneurysm or other acute abnormality. Moderate aortic atherosclerosis. Visualized great vessels tortuous but within normal limits. Mild cardiomegaly no pericardial effusion. Pulmonary arterial tree adequately opacified for evaluation. Main pulmonary artery dilated up to 3.4 cm, suggesting underlying pulmonary hypertension. No filling defect to suggest acute pulmonary embolism. Re-formatted imaging confirms these findings. Mediastinum/Nodes: Visualized thyroid within normal limits. No enlarged mediastinal, hilar, or axillary lymph nodes. Esophagus within normal limits. Lungs/Pleura: Tracheobronchial tree intact and patent. Diffuse central airway thickening noted. Lungs hypoinflated bilaterally with elevation of the hemidiaphragms. Trace layering bilateral pleural effusions, left slightly larger than  right. Associated bibasilar atelectasis. No other focal infiltrates. No pulmonary edema. No pneumothorax. Few scattered subpleural subcentimeter nodular densities measuring up to 5 mm noted within the peripheral upper lobes bilaterally, indeterminate. Musculoskeletal: Median sternotomy wires noted. No acute osseous abnormality. No worrisome lytic or blastic osseous lesions. Review of the MIP images confirms the above findings. CT ABDOMEN and PELVIS FINDINGS Hepatobiliary: Liver demonstrates a normal contrast enhanced appearance. Punctate granuloma noted within the  caudate lobe. Gallbladder surgically absent. No biliary dilatation. Pancreas: Sequelae of prior distal pancreatectomy noted. Pancreas otherwise unremarkable. Spleen: Spleen is absent. Adrenals/Urinary Tract: Adrenal glands within normal limits. Kidneys equal in size with symmetric enhancement. Focal cortical thinning and scarring noted at the lower pole the right kidney. 6 mm nonobstructive stone present at the lower pole the right kidney. No other radiopaque calculi. No hydronephrosis or hydroureter. No focal enhancing renal mass. Partially distended bladder within normal limits. Stomach/Bowel: Stomach decompressed without acute finding. No evidence for bowel obstruction. Large volume retained stool throughout the colon, suggesting constipation. Negative appendix. No acute inflammatory changes seen about the bowels. Vascular/Lymphatic: Advanced aorto bi-iliac atherosclerotic disease. No aneurysm. Mesenteric vessels patent proximally. Two right-sided renal arteries noted. Retroaortic left renal vein noted as well. No adenopathy. Reproductive: Enlarged prostate measuring 6.3 cm in transverse diameter. Other: No free air or fluid. Musculoskeletal: Additional views of the left hip demonstrate an acute nondisplaced fracture through the superior and posterior aspect of the left acetabulum (series 6, image 25). Subtle medial extension towards the iliopectineal line (series 10, image 42). Anterior column intact. Left femoral head remains normally positioned and remains intact. Femoral neck and visualized proximal femur intact. Remainder of the visualized pelvis intact. No pubic diastasis. SI joints approximated. No other acute osseous abnormality. No discrete lytic or blastic osseous lesions. Patient status post posterior decompression at L2 through L4. Moderate to advanced multilevel degenerative spondylolysis throughout the lumbar spine. Review of the MIP images confirms the above findings. IMPRESSION: CTA CHEST  IMPRESSION 1. No CT evidence for acute pulmonary embolism. 2. Low lung volumes with associated trace layering bilateral pleural effusions and bibasilar atelectasis. No other active cardiopulmonary disease. 3. Central airway thickening with dilatation of the main pulmonary artery up to 3.4 cm, suggesting a degree of underlying pulmonary hypertension. CT ABDOMEN AND PELVIS IMPRESSION 1. Acute nondisplaced left acetabular fracture as above. 2. No other acute abnormality within the abdomen and pelvis. 3. 6 mm nonobstructive right renal nephrolithiasis. 4. Large volume retained stool throughout the colon, suggesting constipation. 5. Advanced atherosclerosis with diffuse 3 vessel coronary artery calcifications. Electronically Signed   By: Jeannine Boga M.D.   On: 07/09/2018 03:35   Ct No Charge  Result Date: 07/09/2018 CLINICAL DATA:  Initial evaluation for acute chest pain, abdominal pain. EXAM: CT ANGIOGRAPHY CHEST CT ABDOMEN AND PELVIS WITH CONTRAST TECHNIQUE: Multidetector CT imaging of the chest was performed using the standard protocol during bolus administration of intravenous contrast. Multiplanar CT image reconstructions and MIPs were obtained to evaluate the vascular anatomy. Multidetector CT imaging of the abdomen and pelvis was performed using the standard protocol during bolus administration of intravenous contrast. CONTRAST:  138mL OMNIPAQUE IOHEXOL 350 MG/ML SOLN COMPARISON:  Prior CT from 03/01/2018 FINDINGS: CTA CHEST FINDINGS Cardiovascular: Intrathoracic aorta of normal caliber without aneurysm or other acute abnormality. Moderate aortic atherosclerosis. Visualized great vessels tortuous but within normal limits. Mild cardiomegaly no pericardial effusion. Pulmonary arterial tree adequately opacified for evaluation. Main pulmonary artery dilated up to 3.4 cm, suggesting underlying pulmonary hypertension.  No filling defect to suggest acute pulmonary embolism. Re-formatted imaging confirms these  findings. Mediastinum/Nodes: Visualized thyroid within normal limits. No enlarged mediastinal, hilar, or axillary lymph nodes. Esophagus within normal limits. Lungs/Pleura: Tracheobronchial tree intact and patent. Diffuse central airway thickening noted. Lungs hypoinflated bilaterally with elevation of the hemidiaphragms. Trace layering bilateral pleural effusions, left slightly larger than right. Associated bibasilar atelectasis. No other focal infiltrates. No pulmonary edema. No pneumothorax. Few scattered subpleural subcentimeter nodular densities measuring up to 5 mm noted within the peripheral upper lobes bilaterally, indeterminate. Musculoskeletal: Median sternotomy wires noted. No acute osseous abnormality. No worrisome lytic or blastic osseous lesions. Review of the MIP images confirms the above findings. CT ABDOMEN and PELVIS FINDINGS Hepatobiliary: Liver demonstrates a normal contrast enhanced appearance. Punctate granuloma noted within the caudate lobe. Gallbladder surgically absent. No biliary dilatation. Pancreas: Sequelae of prior distal pancreatectomy noted. Pancreas otherwise unremarkable. Spleen: Spleen is absent. Adrenals/Urinary Tract: Adrenal glands within normal limits. Kidneys equal in size with symmetric enhancement. Focal cortical thinning and scarring noted at the lower pole the right kidney. 6 mm nonobstructive stone present at the lower pole the right kidney. No other radiopaque calculi. No hydronephrosis or hydroureter. No focal enhancing renal mass. Partially distended bladder within normal limits. Stomach/Bowel: Stomach decompressed without acute finding. No evidence for bowel obstruction. Large volume retained stool throughout the colon, suggesting constipation. Negative appendix. No acute inflammatory changes seen about the bowels. Vascular/Lymphatic: Advanced aorto bi-iliac atherosclerotic disease. No aneurysm. Mesenteric vessels patent proximally. Two right-sided renal arteries  noted. Retroaortic left renal vein noted as well. No adenopathy. Reproductive: Enlarged prostate measuring 6.3 cm in transverse diameter. Other: No free air or fluid. Musculoskeletal: Additional views of the left hip demonstrate an acute nondisplaced fracture through the superior and posterior aspect of the left acetabulum (series 6, image 25). Subtle medial extension towards the iliopectineal line (series 10, image 42). Anterior column intact. Left femoral head remains normally positioned and remains intact. Femoral neck and visualized proximal femur intact. Remainder of the visualized pelvis intact. No pubic diastasis. SI joints approximated. No other acute osseous abnormality. No discrete lytic or blastic osseous lesions. Patient status post posterior decompression at L2 through L4. Moderate to advanced multilevel degenerative spondylolysis throughout the lumbar spine. Review of the MIP images confirms the above findings. IMPRESSION: CTA CHEST IMPRESSION 1. No CT evidence for acute pulmonary embolism. 2. Low lung volumes with associated trace layering bilateral pleural effusions and bibasilar atelectasis. No other active cardiopulmonary disease. 3. Central airway thickening with dilatation of the main pulmonary artery up to 3.4 cm, suggesting a degree of underlying pulmonary hypertension. CT ABDOMEN AND PELVIS IMPRESSION 1. Acute nondisplaced left acetabular fracture as above. 2. No other acute abnormality within the abdomen and pelvis. 3. 6 mm nonobstructive right renal nephrolithiasis. 4. Large volume retained stool throughout the colon, suggesting constipation. 5. Advanced atherosclerosis with diffuse 3 vessel coronary artery calcifications. Electronically Signed   By: Jeannine Boga M.D.   On: 07/09/2018 03:35   Dg Hip Unilat W Or Wo Pelvis 2-3 Views Left  Result Date: 07/08/2018 CLINICAL DATA:  Recent fall from ladder with left hip pain, initial encounter EXAM: DG HIP (WITH OR WITHOUT PELVIS) 3V  LEFT COMPARISON:  05/19/2014 FINDINGS: Pelvic ring is intact. Degenerative changes of lumbar spine and hip joints are noted. No acute fractures noted. IMPRESSION: No fracture identified. Electronically Signed   By: Inez Catalina M.D.   On: 07/08/2018 21:03        Scheduled Meds:  amLODipine  10 mg Oral Daily   aspirin EC  81 mg Oral QHS   atorvastatin  20 mg Oral Daily   docusate sodium  100 mg Oral BID   gabapentin  100 mg Oral BID   lisinopril  20 mg Oral Daily   And   hydrochlorothiazide  12.5 mg Oral Daily   insulin aspart  0-9 Units Subcutaneous TID WC   insulin glargine  15 Units Subcutaneous QHS   multivitamin with minerals  1 tablet Oral Daily   pantoprazole  80 mg Oral Q1200   polyethylene glycol  17 g Oral Daily   ranolazine  500 mg Oral BID   senna  1 tablet Oral Daily   tamsulosin  0.4 mg Oral QPC supper   Continuous Infusions:   LOS: 1 day    Time spent: 25 minutes spent in the coordination of care today.    Jonnie Finner, DO [facility] Pager [phone]  If 7PM-7AM, please contact night-coverage [email or web address] Password [facility] 07/10/2018, 11:58 AM

## 2018-07-10 NOTE — Progress Notes (Addendum)
Inpatient Rehabilitation Admissions Coordinator  Inpatient Rehab Consult received. I met with patient at the bedside for rehabilitation assessment. Patient states that in another 1 to 2 days, he believes that he will be able to d/c home with family. His 77 year old daughter lives with him and his wife. [employer] is unlikely to approve an inpt rehab admit for this diagnosis at this time. Recommend Home with HH if pt continues to progress with therapy. Otherwise may need SNF.   , RN, MSN Rehab Admissions Coordinator [phone] 07/10/2018 1:05 PM   

## 2018-07-10 NOTE — Evaluation (Signed)
Occupational Therapy Evaluation Patient Details Name: Ricardo Le MRN: [id number] DOB: [birth year] Today's Date: 07/10/2018    History of Present Illness 78 yo admitted after fall off step stool with hip fx non operative management. PMhx: DM, HTN, CABG, HOH, CAD   Clinical Impression   PTA Pt independent in ADL and mobility. Enjoys carpentry, drives, etc. Pt today is mod A for transfers and mobility with RW (min A for physical assist, mod due to the frequency and repetitive nature of cues for safety/sequencing/WB precautions). Pt is max A for LB Dressing on the LLE, and able to complete UB ADL from seated position with set up. Pt will benefit from skilled OT in the acute setting as well as afterwards at the CIR level. Pt has excellent family support from his independent wife, and 2 adult children who live next door. Pt did repeatedly ask the same questions over and over throughout the session. OT will continue to follow acutely.     Follow Up Recommendations  CIR;Supervision/Assistance - 24 hour    Equipment Recommendations  Other (comment)(defer to next venue of care)    Recommendations for Other Services       Precautions / Restrictions Precautions Precautions: Fall Restrictions Weight Bearing Restrictions: Yes LLE Weight Bearing: Touchdown weight bearing      Mobility Bed Mobility               General bed mobility comments: OOB in recliner at beginning and end of session  Transfers Overall transfer level: Needs assistance Equipment used: Rolling walker (2 wheeled) Transfers: Sit to/from [facility] Sit to Stand: Mod assist Stand pivot transfers: Mod assist       General transfer comment: min A for physical assist, but required constant verbal cues for sequencing and WB    Balance Overall balance assessment: Needs assistance;History of Falls Sitting-balance support: No upper extremity supported;Feet supported Sitting balance-Leahy Scale:  Good     Standing balance support: Bilateral upper extremity supported Standing balance-Leahy Scale: Poor Standing balance comment: dependent on BUE                            ADL either performed or assessed with clinical judgement   ADL Overall ADL's : Needs assistance/impaired Eating/Feeding: Modified independent;Sitting   Grooming: Set up;Sitting;Wash/dry hands;Wash/dry face;Oral care Grooming Details (indicate cue type and reason): unable to maintain standing without BUE - requires sitting Upper Body Bathing: Set up;Sitting   Lower Body Bathing: Moderate assistance;Sitting/lateral leans Lower Body Bathing Details (indicate cue type and reason): assist for LLE Upper Body Dressing : Set up;Sitting   Lower Body Dressing: Moderate assistance;Sit to/from stand Lower Body Dressing Details (indicate cue type and reason): requires assist for LLE, can access RLE Toilet Transfer: Moderate assistance;Stand-pivot;BSC;RW;Cueing for safety;Cueing for sequencing Toilet Transfer Details (indicate cue type and reason): frequent cues for sequencing and WB precautions Toileting- Clothing Manipulation and Hygiene: Sit to/from stand;Maximal assistance   Tub/ Shower Transfer: Walk-in shower;Moderate assistance;Ambulation;Rolling walker   Functional mobility during ADLs: Moderate assistance;Rolling walker;Cueing for safety;Cueing for sequencing       Vision         Perception     Praxis      Pertinent Vitals/Pain Pain Assessment: Faces Faces Pain Scale: Hurts little more Pain Location: sore left hip with movement, no pain at rest Pain Descriptors / Indicators: Aching;Discomfort Pain Intervention(s): Monitored during session;Repositioned     Hand Dominance Right   Extremity/Trunk Assessment  Upper Extremity Assessment Upper Extremity Assessment: Overall WFL for tasks assessed   Lower Extremity Assessment Lower Extremity Assessment: LLE deficits/detail   Cervical /  Trunk Assessment Cervical / Trunk Assessment: Normal   Communication Communication Communication: No difficulties   Cognition Arousal/Alertness: Awake/alert Behavior During Therapy: WFL for tasks assessed/performed Overall Cognitive Status: Impaired/Different from baseline Area of Impairment: Attention;Memory;Safety/judgement;Problem solving                   Current Attention Level: Sustained Memory: Decreased short-term memory   Safety/Judgement: Decreased awareness of safety;Decreased awareness of deficits   Problem Solving: Slow processing;Difficulty sequencing;Requires verbal cues General Comments: Pt frequently asked questions (and repeated them) repeated WB precautions frequently   General Comments  VSS    Exercises     Shoulder Instructions      Home Living Family/patient expects to be discharged to:: Private residence Living Arrangements: Spouse/significant other Available Help at Discharge: Family;Available 24 hours/day Type of Home: House Home Access: Stairs to enter [employer] of Steps: 1 Entrance Stairs-Rails: None Home Layout: One level     Bathroom Shower/Tub: Occupational psychologist: Standard     Home Equipment: Bedside commode;Walker - 2 wheels;Shower seat - built in;Grab bars - tub/shower;Hand held shower head(adjustable bed)          Prior Functioning/Environment Level of Independence: Independent                 OT Problem List: Decreased range of motion;Decreased activity tolerance;Impaired balance (sitting and/or standing);Decreased cognition;Decreased safety awareness;Decreased knowledge of use of DME or AE;Decreased knowledge of precautions;Pain      OT Treatment/Interventions: Self-care/ADL training;DME and/or AE instruction;Therapeutic activities;Patient/family education;Balance training;Cognitive remediation/compensation    OT Goals(Current goals can be found in the care plan section) Acute Rehab OT  Goals Patient 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 OT Goal Formulation: With patient Time For Goal Achievement: 07/24/18 Potential to Achieve Goals: Good ADL Goals Pt Will Perform Grooming: with modified independence;sitting;standing Pt Will Perform Lower Body Bathing: with modified independence;with adaptive equipment;sitting/lateral leans Pt Will Perform Lower Body Dressing: with modified independence;with adaptive equipment;sit to/from stand;sitting/lateral leans Pt Will Transfer to Toilet: with modified independence;ambulating Pt Will Perform Toileting - Clothing Manipulation and hygiene: with modified independence;sitting/lateral leans Additional ADL Goal #1: Pt will maintain TDWB with 2 or less cues during transfers and short ambulatin for ADL  OT Frequency: Min 2X/week   Barriers to D/C:            Co-evaluation PT/OT/SLP Co-Evaluation/Treatment: Yes Reason for Co-Treatment: For patient/therapist safety;To address functional/ADL transfers PT goals addressed during session: Mobility/safety with mobility;Balance;Proper use of DME OT goals addressed during session: ADL's and self-care;Proper use of Adaptive equipment and DME      AM-PAC OT "6 Clicks" Daily Activity     Outcome Measure Help from another person eating meals?: None Help from another person taking care of personal grooming?: None(seated) Help from another person toileting, which includes using toliet, bedpan, or urinal?: A Lot Help from another person bathing (including washing, rinsing, drying)?: A Lot Help from another person to put on and taking off regular upper body clothing?: A Little Help from another person to put on and taking off regular lower body clothing?: A Lot 6 Click Score: 17   End of Session Equipment Utilized During Treatment: Gait belt;Rolling walker Nurse Communication: Mobility status;Precautions  Activity Tolerance: Patient tolerated treatment well Patient left: in chair;with call  bell/phone within reach;with chair alarm set  OT  Visit Diagnosis: Unsteadiness on feet (R26.81);Other abnormalities of gait and mobility (R26.89);History of falling (Z91.81);Other symptoms and signs involving cognitive function;Pain Pain - Right/Left: Left Pain - part of body: Hip                Time: 9784-7841 OT Time Calculation (min): 28 min Charges:  OT General Charges $OT Visit: 1 Visit OT Treatments $Self Care/Home Management : 8-22 mins  Hulda Humphrey OTR/L Acute Rehabilitation Services Pager: [phone] Office: [place] 07/10/2018, 12:27 PM

## 2018-07-10 NOTE — Progress Notes (Signed)
Inpatient Diabetes Program Recommendations  AACE/ADA: New Consensus Statement on Inpatient Glycemic Control   Target Ranges:  Prepandial:   less than 140 mg/dL      Peak postprandial:   less than 180 mg/dL (1-2 hours)      Critically ill patients:  140 - 180 mg/dL   Results for Ricardo Le, Ricardo Le (MRN [id number]) as of 07/10/2018 08:24  Ref. Range 07/09/2018 08:11 07/09/2018 12:14 07/09/2018 16:56 07/09/2018 23:21 07/10/2018 06:10  Glucose-Capillary Latest Ref Range: 70 - 99 mg/dL 206 (H) 261 (H) 236 (H) 262 (H) 193 (H)    Review of Glycemic Control  Diabetes history: DM2 Outpatient Diabetes medications: Glipizide 10 mg BID, Lantus 15 units QHS Current orders for Inpatient glycemic control: Lantus 15 units QHS, Novolog 0-9 units TID with meals  Inpatient Diabetes Program Recommendations:   Correction (SSI): Please consider ordering Novolog 0-5 units QHS.  HgbA1C: A1C 7.3% on 07/09/18 indicating an average glucose of 163 mg/dl over the past 2-3 months.  Thanks, Barnie Alderman, RN, MSN, CDE Diabetes Coordinator Inpatient Diabetes Program [phone] (Team Pager from 8am to 5pm)

## 2018-07-10 NOTE — Plan of Care (Signed)
  Problem: Pain Managment: Goal: General experience of comfort will improve Outcome: Progressing   Problem: Safety: Goal: Ability to remain free from injury will improve Outcome: Progressing   

## 2018-07-10 NOTE — Progress Notes (Signed)
Physical Therapy Treatment Patient Details Name: Ricardo Le MRN: [id number] DOB: 1940-04-30 Today's Date: 07/10/2018    History of Present Illness 78 yo admitted after fall off step stool with hip fx non operative management. PMhx: DM, HTN, CABG, HOH, CAD    PT Comments    Pt is able to progress his mobility today, however is limited in safe mobility by TDWB precautions on L LE, as well as slight confusion. Pt currently requires modAx2 for transfers and modA with close chair follow for ambulation in 10 feet bouts before fatigue in UE causes decreased adherence to weightbearing precautions. Pt repeats questions about mobility and bone healing despite being educated just prior. PT recommends CIR level rehab to enforce TDWB and progress mobility prior to d/c home where he has support from daughter who lives with him and son who lives next door. PT will continue to follow acutely.   Follow Up Recommendations  CIR;Supervision/Assistance - 24 hour     Equipment Recommendations  Rolling walker with 5" wheels;Wheelchair (measurements PT);3in1 (PT)       Precautions / Restrictions Precautions Precautions: Fall Restrictions Weight Bearing Restrictions: Yes LLE Weight Bearing: Touchdown weight bearing    Mobility  Bed Mobility               General bed mobility comments: OOB in recliner at beginning and end of session  Transfers Overall transfer level: Needs assistance Equipment used: Rolling walker (2 wheeled) Transfers: Sit to/from [facility] Sit to Stand: Mod assist Stand pivot transfers: Mod assist       General transfer comment: min A for physical assist, but required constant verbal cues for sequencing and WB  Ambulation/Gait Ambulation/Gait assistance: Mod assist;+2 safety/equipment Gait Distance (Feet): 10 Feet(2x10) Assistive device: Rolling walker (2 wheeled) Gait Pattern/deviations: Step-to pattern;Trunk flexed Gait velocity: slowed Gait  velocity interpretation: <1.31 ft/sec, indicative of household ambulator General Gait Details: modA for steadying and maximal verbal and tacile cues for TDWB on L LE, increased cuing for sequencing and proximity to RW for efficient UE usage         Balance Overall balance assessment: Needs assistance;History of Falls Sitting-balance support: No upper extremity supported;Feet supported Sitting balance-Leahy Scale: Good     Standing balance support: Bilateral upper extremity supported Standing balance-Leahy Scale: Poor Standing balance comment: dependent on BUE                             Cognition Arousal/Alertness: Awake/alert Behavior During Therapy: WFL for tasks assessed/performed Overall Cognitive Status: Impaired/Different from baseline Area of Impairment: Attention;Memory;Safety/judgement;Problem solving                   Current Attention Level: Sustained Memory: Decreased short-term memory   Safety/Judgement: Decreased awareness of safety;Decreased awareness of deficits   Problem Solving: Slow processing;Difficulty sequencing;Requires verbal cues General Comments: Pt frequently asked questions (and repeated them) repeated WB precautions frequently         General Comments General comments (skin integrity, edema, etc.): VSS      Pertinent Vitals/Pain Pain Assessment: Faces Faces Pain Scale: Hurts little more Pain Location: sore left hip with movement, no pain at rest Pain Descriptors / Indicators: Aching;Discomfort Pain Intervention(s): Monitored during session;Repositioned    Home Living Family/patient expects to be discharged to:: Private residence Living Arrangements: Spouse/significant other Available Help at Discharge: Family;Available 24 hours/day Type of Home: House Home Access: Stairs to enter Entrance Stairs-Rails: None Home Layout:  One level Home Equipment: Bedside commode;Walker - 2 wheels;Shower seat - built in;Grab bars -  tub/shower;Hand held shower head(adjustable bed)      Prior Function Level of Independence: Independent          PT Goals (current goals can now be found in the care plan section) Acute Rehab PT Goals Patient 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 PT Goal Formulation: With patient Time For Goal Achievement: 07/23/18 Potential to Achieve Goals: Good Progress towards PT goals: Progressing toward goals    Frequency    Min 5X/week      PT Plan Current plan remains appropriate    Co-evaluation PT/OT/SLP Co-Evaluation/Treatment: Yes Reason for Co-Treatment: For patient/therapist safety;To address functional/ADL transfers PT goals addressed during session: Mobility/safety with mobility;Balance;Proper use of DME OT goals addressed during session: ADL's and self-care;Proper use of Adaptive equipment and DME      AM-PAC PT "6 Clicks" Mobility   Outcome Measure  Help needed turning from your back to your side while in a flat bed without using bedrails?: A Little Help needed moving from lying on your back to sitting on the side of a flat bed without using bedrails?: A Little Help needed moving to and from a bed to a chair (including a wheelchair)?: A Lot Help needed standing up from a chair using your arms (e.g., wheelchair or bedside chair)?: A Lot Help needed to walk in hospital room?: A Lot Help needed climbing 3-5 steps with a railing? : Total 6 Click Score: 13    End of Session Equipment Utilized During Treatment: Gait belt Activity Tolerance: Patient tolerated treatment well Patient left: in chair;with call bell/phone within reach;with nursing/sitter in room;with chair alarm set Nurse Communication: Mobility status;Weight bearing status PT Visit Diagnosis: Unsteadiness on feet (R26.81);Muscle weakness (generalized) (M62.81);Other abnormalities of gait and mobility (R26.89);History of falling (Z91.81);Difficulty in walking, not elsewhere classified (R26.2)     Time:  8325-4982 PT Time Calculation (min) (ACUTE ONLY): 30 min  Charges:  $Gait Training: 8-22 mins                     Elihue Ebert B. Migdalia Dk PT, DPT Acute Rehabilitation Services Pager [phone] Office [phone]    [place] 07/10/2018, 1:08 PM

## 2018-07-10 NOTE — Plan of Care (Signed)
?  Problem: Coping: ?Goal: Level of anxiety will decrease ?Outcome: Progressing ?  ?Problem: Safety: ?Goal: Ability to remain free from injury will improve ?Outcome: Progressing ?  ?

## 2018-07-11 LAB — CBC WITH DIFFERENTIAL/PLATELET
Abs Immature Granulocytes: 0.05 10*3/uL (ref 0.00–0.07)
Basophils Absolute: 0.1 10*3/uL (ref 0.0–0.1)
Basophils Relative: 1 %
Eosinophils Absolute: 1 10*3/uL — ABNORMAL HIGH (ref 0.0–0.5)
Eosinophils Relative: 8 %
HCT: 40.5 % (ref 39.0–52.0)
Hemoglobin: 14.1 g/dL (ref 13.0–17.0)
Immature Granulocytes: 0 %
Lymphocytes Relative: 22 %
Lymphs Abs: 2.6 10*3/uL (ref 0.7–4.0)
MCH: 32 pg (ref 26.0–34.0)
MCHC: 34.8 g/dL (ref 30.0–36.0)
MCV: 91.8 fL (ref 80.0–100.0)
Monocytes Absolute: 2 10*3/uL — ABNORMAL HIGH (ref 0.1–1.0)
Monocytes Relative: 17 %
Neutro Abs: 6 10*3/uL (ref 1.7–7.7)
Neutrophils Relative %: 52 %
Platelets: 181 10*3/uL (ref 150–400)
RBC: 4.41 MIL/uL (ref 4.22–5.81)
RDW: 12.6 % (ref 11.5–15.5)
WBC: 11.7 10*3/uL — ABNORMAL HIGH (ref 4.0–10.5)
nRBC: 0 % (ref 0.0–0.2)

## 2018-07-11 LAB — RENAL FUNCTION PANEL
Albumin: 3.3 g/dL — ABNORMAL LOW (ref 3.5–5.0)
Anion gap: 7 (ref 5–15)
BUN: 24 mg/dL — ABNORMAL HIGH (ref 8–23)
CO2: 32 mmol/L (ref 22–32)
Calcium: 9.2 mg/dL (ref 8.9–10.3)
Chloride: 98 mmol/L (ref 98–111)
Creatinine, Ser: 1.05 mg/dL (ref 0.61–1.24)
GFR calc Af Amer: >60 mL/min (ref >60)
GFR calc non Af Amer: >60 mL/min (ref >60)
Glucose, Bld: 214 mg/dL — ABNORMAL HIGH (ref 70–99)
Phosphorus: 3.8 mg/dL (ref 2.5–4.6)
Potassium: 3.7 mmol/L (ref 3.5–5.1)
Sodium: 137 mmol/L (ref 135–145)

## 2018-07-11 LAB — MAGNESIUM: Magnesium: 2 mg/dL (ref 1.7–2.4)

## 2018-07-11 LAB — GLUCOSE, CAPILLARY
Glucose-Capillary: 201 mg/dL — ABNORMAL HIGH (ref 70–99)
Glucose-Capillary: 273 mg/dL — ABNORMAL HIGH (ref 70–99)

## 2018-07-11 MED ORDER — TRAMADOL HCL 50 MG PO TABS: 50.0000 mg | ORAL_TABLET | Freq: Two times a day (BID) | ORAL | 0 refills | Status: AC

## 2018-07-11 MED ORDER — DOCUSATE SODIUM 100 MG PO CAPS: 100.0000 mg | ORAL_CAPSULE | Freq: Two times a day (BID) | ORAL | 0 refills | Status: DC

## 2018-07-11 MED ORDER — POLYETHYLENE GLYCOL 3350 17 G PO PACK: 17.0000 g | PACK | Freq: Every day | ORAL | 0 refills | Status: DC | PRN

## 2018-07-11 NOTE — Plan of Care (Signed)

## 2018-07-11 NOTE — TOC Initial Note (Addendum)
Transition of Care [facility]) - Initial/Assessment Note    Patient Details  Name: Ricardo Le MRN: [id number] Date of Birth: 11/15/1940  Transition of Care [facility] [place]) CM/SW Contact:    Orvis Brill Phone Number: [phone] 07/11/2018, 10:57 AM  Clinical Narrative:                 Patient is very [place], lives at home with spouse; PCP is Dr Dennard Schaumann; has private insurance with [employer] with prescription drug coverage; [facility] [address]; patient requested that I talk to his spouse for [facility] choice. TCT spouse Inez Catalina, she chose [facility] Personal assistant) Leon; Dan with [facility] called for arrangements; Rolling walker ordered also. CM will continue to follow for progression of care.  1:45 pm- Received message from Linna Hoff that [facility] does not serve the county that the patient live in; 2nd [place] choice is [facility]; referral made as requested. Orders faxed as requested.  Expected Discharge Plan: [place] Barriers to Discharge: No Barriers Identified   Patient Goals and CMS Choice Patient states their goals for this hospitalization and ongoing recovery are:: to get stronger CMS Medicare.gov Compare Post Acute Care list provided to:: Patient Choice offered to / list presented to : NA  Expected Discharge Plan and Services Expected Discharge Plan: [place] In-house Referral: NA Discharge Planning Services: CM Consult Post Acute Care Choice: NA Living arrangements for the past 2 months: Single Family Home                 DME Arranged: Idalia Needle DME Agency: [employer] Date DME Agency Contacted: 07/11/18 Time DME Agency Contacted: 78 Representative spoke with at DME Agency: [place]: PT [place]: [place] ([place]) Date [place]: 07/11/18 Time [place]: 23 Representative spoke with at [place]: Hydrographic surveyor  Prior Living Arrangements/Services Living arrangements  for the past 2 months: [place] with:: Spouse Patient language and need for interpreter reviewed:: Yes Do you feel safe going back to the place where you live?: Yes      Need for Family Participation in Patient Care: Yes (Comment) Care giver support system in place?: Yes (comment)   Criminal Activity/Legal Involvement Pertinent to Current Situation/Hospitalization: No - Comment as needed  Activities of Daily Living Home Assistive Devices/Equipment: None ADL Screening (condition at time of admission) Patient's cognitive ability adequate to safely complete daily activities?: Yes Is the patient deaf or have difficulty hearing?: No Does the patient have difficulty seeing, even when wearing glasses/contacts?: No Does the patient have difficulty concentrating, remembering, or making decisions?: No Patient able to express need for assistance with ADLs?: Yes Does the patient have difficulty dressing or bathing?: Yes Independently performs ADLs?: No Does the patient have difficulty walking or climbing stairs?: No Weakness of Legs: Left Weakness of Arms/Hands: None  Permission Sought/Granted Permission sought to share information with : Case Manager Permission granted to share information with : Yes, Verbal Permission Granted  Share Information with NAME: Spouse  Permission granted to share info w AGENCY: [place] agency, DME agency        Emotional Assessment Appearance:: Developmentally appropriate Attitude/Demeanor/Rapport: Gracious Affect (typically observed): Accepting Orientation: : Oriented to Self, Oriented to  Time, Oriented to Place, Oriented to Situation Alcohol / Substance Use: Other (comment) Psych Involvement: No (comment)  Admission diagnosis:  Trauma [T14.90XA] Chest pain, unspecified type [R07.9] Closed nondisplaced fracture of left acetabulum,  unspecified portion of acetabulum, initial encounter [facility]) [S32.402A] Patient Active Problem List   Diagnosis  Date Noted  . Fracture of left acetabulum ([place]) 07/09/2018  . Elevated troponin   . Coronary artery disease involving native coronary artery of native heart with unstable angina pectoris ([place])   . Chest pain 02/20/2017  . Precordial chest pain   . Angina pectoris ([place]) 02/19/2017  . Atherosclerotic heart disease of native coronary artery with unstable angina pectoris ([place]) 03/08/2015  . Hyperlipidemia 01/26/2015  . Cervical spondylosis without myelopathy 07/28/2014  . Cervical spondylosis 07/28/2014  . Constipation 05/19/2014  . Pain 05/18/2014  . PAD (peripheral artery disease) ([place]) 03/20/2012  . Unstable angina ([place]) 05/05/2011  . DDD (degenerative disc disease)   . HNP (herniated nucleus pulposus), lumbar   . Pancreatic mass   . Nephrolithiasis   . Carotid bruit   . S/P CABG x 4 03/08/2011  . Bruit 07/06/2010  . NONSPECIFIC ABN FINDING RAD & OTH EXAM GI TRACT 06/09/2009  . NAUSEA ALONE 05/28/2009  . CHANGE IN BOWELS 05/28/2009  . Elevated lipids 05/27/2009  . Abdominal pain, generalized 05/27/2009  . Type 2 diabetes mellitus with vascular disease ([place]) 12/16/2008  . Essential hypertension 12/16/2008  . BACK PAIN 12/16/2008  . Palpitations 12/16/2008   PCP:  Angelina Sheriff, MD Pharmacy:   [facility] [place] - [address] AT [address] [place] [address] Phone: [phone] Fax: [phone]     Social Determinants of Health (SDOH) Interventions    Readmission Risk Interventions No flowsheet data found.

## 2018-07-11 NOTE — Progress Notes (Signed)
Inpatient Diabetes Program Recommendations  AACE/ADA: New Consensus Statement on Inpatient Glycemic Control   Target Ranges:  Prepandial:   less than 140 mg/dL      Peak postprandial:   less than 180 mg/dL (1-2 hours)      Critically ill patients:  140 - 180 mg/dL  Results for Ricardo Le, Ricardo Le (MRN [id number]) as of 07/11/2018 09:52  Ref. Range 07/10/2018 06:10 07/10/2018 11:19 07/10/2018 16:03 07/10/2018 21:04 07/11/2018 06:26  Glucose-Capillary Latest Ref Range: 70 - 99 mg/dL 193 (H) 207 (H) 267 (H) 216 (H) 201 (H)   Results for Ricardo Le, Ricardo Le (MRN [id number]) as of 07/10/2018 08:24  Ref. Range 07/09/2018 08:11 07/09/2018 12:14 07/09/2018 16:56 07/09/2018 23:21  Glucose-Capillary Latest Ref Range: 70 - 99 mg/dL 206 (H) 261 (H) 236 (H) 262 (H)    Review of Glycemic Control  Diabetes history: DM2 Outpatient Diabetes medications: Glipizide 10 mg BID, Lantus 15 units QHS Current orders for Inpatient glycemic control: Lantus 15 units QHS, Novolog 0-9 units TID with meals, Novolog 2 units QHS  Inpatient Diabetes Program Recommendations:   Insulin-Basal: Please consider increasing Lantus to 17 units QHS.  Correction (SSI): Please consider changing Novolog at bedtime to a correction scale using Novolog 0-5 units QHS.  Insulin-Meal Coverage: Please consider ordering Novolog 3 units TID with meals for meal coverage if patient eats at least 50% of meals.  HgbA1C: A1C 7.3% on 07/09/18 indicating an average glucose of 163 mg/dl over the past 2-3 months.  Thanks, Barnie Alderman, RN, MSN, CDE Diabetes Coordinator Inpatient Diabetes Program [phone] (Team Pager from 8am to 5pm)

## 2018-07-11 NOTE — Progress Notes (Signed)
Physical Therapy Treatment Patient Details Name: Ricardo Le MRN: [id number] DOB: 1940/06/19 Today's Date: 07/11/2018    History of Present Illness 78 yo admitted after fall off step stool with hip fx non operative management. PMhx: DM, HTN, CABG, HOH, CAD    PT Comments    Pt very eager to move and return home. Pt able to increase gait distance with hopping today including turning and able to maintain static standing with RW. Pt with significantly improved cognition and mobility this session with desire to return home as quickly as possible. Pt would continue to benefit from further therapy to maximize independence prior to return home. Pt educated for transfers, gait and HEP. In chair for breakfast end of session.    Follow Up Recommendations  Home health PT;Supervision/Assistance - 24 hour     Equipment Recommendations  Rolling walker with 5" wheels;Wheelchair (measurements PT);3in1 (PT)    Recommendations for Other Services       Precautions / Restrictions Precautions Precautions: Fall Restrictions Weight Bearing Restrictions: Yes LLE Weight Bearing: Touchdown weight bearing    Mobility  Bed Mobility Overal bed mobility: Needs Assistance Bed Mobility: Supine to Sit     Supine to sit: Min guard     General bed mobility comments: with use of right bed rail pt able to transition to sitting EOB without physical assist with increased time  Transfers Overall transfer level: Needs assistance   Transfers: Sit to/from Stand Sit to Stand: Min guard         General transfer comment: cues for hand placement and safety with pt maintaining NWB status throughout  Ambulation/Gait Ambulation/Gait assistance: Min guard Gait Distance (Feet): 40 Feet Assistive device: Rolling walker (2 wheeled) Gait Pattern/deviations: Step-to pattern;Trunk flexed   Gait velocity interpretation: 1.31 - 2.62 ft/sec, indicative of limited community ambulator General Gait Details: cues  for posture and safety. Pt at times putting his left foot down but maintains TDWB throughout. pt able to increased gait distance and no need for a chair follow   Stairs             Wheelchair Mobility    Modified Rankin (Stroke Patients Only)       Balance Overall balance assessment: History of Falls Sitting-balance support: No upper extremity supported;Feet supported Sitting balance-Leahy Scale: Good     Standing balance support: Bilateral upper extremity supported Standing balance-Leahy Scale: Poor Standing balance comment: dependent on BUE to maintain TDWB status                            Cognition Arousal/Alertness: Awake/alert Behavior During Therapy: WFL for tasks assessed/performed Overall Cognitive Status: Impaired/Different from baseline Area of Impairment: Attention;Memory;Safety/judgement;Problem solving                   Current Attention Level: Selective Memory: Decreased short-term memory Following Commands: Follows one step commands with increased time Safety/Judgement: Decreased awareness of safety;Decreased awareness of deficits   Problem Solving: Slow processing;Requires verbal cues General Comments: pt aware of weight bearing status, situation and appears more functional today without repetition of questions      Exercises General Exercises - Lower Extremity Long Arc Quad: AROM;Seated;Left;15 reps Heel Slides: AAROM;Supine;Left;15 reps Hip ABduction/ADduction: AROM;15 reps;Seated;Left Hip Flexion/Marching: AROM;15 reps;Seated;Left    General Comments        Pertinent Vitals/Pain Pain Score: 4  Pain Location: sore left hip with movement, no pain at rest Pain Descriptors / Indicators: Aching;Discomfort  Pain Intervention(s): Limited activity within patient's tolerance;Monitored during session;Repositioned    Home Living                      Prior Function            PT Goals (current goals can now be  found in the care plan section) Progress towards PT goals: Progressing toward goals    Frequency           PT Plan Current plan remains appropriate    Co-evaluation              AM-PAC PT "6 Clicks" Mobility   Outcome Measure  Help needed turning from your back to your side while in a flat bed without using bedrails?: A Little Help needed moving from lying on your back to sitting on the side of a flat bed without using bedrails?: A Little Help needed moving to and from a bed to a chair (including a wheelchair)?: A Little Help needed standing up from a chair using your arms (e.g., wheelchair or bedside chair)?: A Little Help needed to walk in hospital room?: A Little Help needed climbing 3-5 steps with a railing? : A Lot 6 Click Score: 17    End of Session Equipment Utilized During Treatment: Gait belt Activity Tolerance: Patient tolerated treatment well Patient left: in chair;with call bell/phone within reach;with chair alarm set Nurse Communication: Mobility status;Weight bearing status PT Visit Diagnosis: Unsteadiness on feet (R26.81);Muscle weakness (generalized) (M62.81);Other abnormalities of gait and mobility (R26.89);History of falling (Z91.81);Difficulty in walking, not elsewhere classified (R26.2)     Time: 2035-5974 PT Time Calculation (min) (ACUTE ONLY): 15 min  Charges:  $Gait Training: 8-22 mins                     Symphonie Schneiderman Pam Drown, PT Acute Rehabilitation Services Pager: [phone] Office: [place] 07/11/2018, 11:49 AM

## 2018-07-11 NOTE — Progress Notes (Signed)
Discharge instructions given to patient he verbalized understanding. PIV removed without diffulculty times 2 sites right arm. His wife has been called to pick him up will be here shortly she stated. CCMD called about removal of tele box. No further changes noted.

## 2018-07-11 NOTE — Discharge Summary (Signed)
. Physician Discharge Summary  Ricardo Le KCL:[id number] DOB: 07/22/1940 DOA: 07/08/2018  PCP: Angelina Sheriff, MD  Admit date: 07/08/2018 Discharge date: 07/11/2018  Admitted From: Home Disposition:  Discharged to home with home health.  Recommendations for Outpatient Follow-up:  1. Follow up with PCP in 1-2 weeks 2. Please obtain BMP/CBC in one week  Discharge Condition: stable  CODE STATUS: FULL   Brief/Interim Summary: Ricardo Le a 78 y.o.malewith medical history significant ofDM2, HTN, CAD s/p CABG, multiple months of CP, anginal with cath on 4/14 showing severe distal disease from grafts but not amenable to stenting and stable since Jan 2019, medical management was recommended.  Today he fell down 1 step off of a ladder, landed on buttocks. L hip pain and inability to bear weight following fall.  Discharge Diagnoses:  Principal Problem:   Fracture of left acetabulum [facility]) Active Problems:   Type 2 diabetes mellitus with vascular disease ([place])   Essential hypertension   Constipation   S/P CABG x 4   Coronary artery disease involving native coronary artery of native heart with unstable angina pectoris (HCC)   Elevated troponin  1. Fx of left acetabulum     - per ortho: Non-operative management, Mobilize w/ therapy, T[facility] LLE; Follow up in the office with Dr. Alain Marion in 2 weeks.  Please call with questions.     - Morphine PRN pain     - eval by PT; HHPT recommended  2. Unstable Angina     - Serial trops     - per cards: Elevated troponin due to demand ischemia in setting of acute hip fracture. No angina or Ecg changes. Coronary/graft anatomy known from cardiac cath one month ago. Plan to continue medical therapy as he was on pre op. Follow up with Dr Johnsie Cancel as outpatient. [facility] will sign off.Medication Recommendations:  continue pre admission cardiac meds. Other recommendations (labs, testing, etc): none Follow up as an outpatient:  with Dr.  Johnsie Cancel     - Tele monitor     - Cont home atorvastatin, ASA     - SL NTG PRN  3. Constipation     - Scheduled Senna and docusate     - PRN sorbitol, miralax  4. DM2     - Lantus 15units qHS; 2units novolog qHS     - Sensitive SSI Q4H     - resume home meds at discharge  5. HTN      - continue amlodipine, lisinopril  Discharge Instructions 1. Follow up with PCP in 1 week.   Allergies as of 07/11/2018      Reactions   Rosuvastatin Calcium Other (See Comments)   Myalgias   Iohexol Hives   pt ok with premedication (Benadryl 50mg  PO), Onset Date: [id number]   Statins Other (See Comments)   Myalgias      Medication List    TAKE these medications   acetaminophen 325 MG tablet Commonly known as:  TYLENOL Take 650 mg by mouth every 6 (six) hours as needed (pain).   Tylenol 8 Hour Arthritis Pain 650 MG CR tablet Generic drug:  acetaminophen Take 650 mg by mouth every 8 (eight) hours as needed for pain (arthritis).   amLODipine 10 MG tablet Commonly known as:  NORVASC Take 10 mg by mouth daily.   aspirin EC 81 MG tablet Take 81 mg by mouth at bedtime.   atorvastatin 20 MG tablet Commonly known as:  LIPITOR Take 20 mg by  mouth daily.   docusate sodium 100 MG capsule Commonly known as:  COLACE Take 1 capsule (100 mg total) by mouth 2 (two) times daily.   esomeprazole 40 MG capsule Commonly known as:  NEXIUM Take 40 mg by mouth daily.   gabapentin 100 MG capsule Commonly known as:  NEURONTIN Take 100 mg by mouth 2 (two) times daily.   glipiZIDE 10 MG tablet Commonly known as:  GLUCOTROL Take 10 mg by mouth 2 (two) times daily.   Lantus SoloStar 100 UNIT/ML injection Generic drug:  insulin glargine Inject 15 Units into the skin at bedtime.   lisinopril-hydrochlorothiazide 20-12.5 MG tablet Commonly known as:  ZESTORETIC Take 1 tablet by mouth daily.   multivitamin with minerals Tabs tablet Take 1 tablet by mouth daily.   nitroGLYCERIN 0.4 MG SL  tablet Commonly known as:  NITROSTAT Place 1 tablet (0.4 mg total) under the tongue every 5 (five) minutes as needed for chest pain.   polyethylene glycol 17 g packet Commonly known as:  MIRALAX / GLYCOLAX Take 17 g by mouth daily as needed for severe constipation.   ranolazine 500 MG 12 hr tablet Commonly known as:  Ranexa Take 1 tablet (500 mg total) by mouth 2 (two) times daily.   tamsulosin 0.4 MG Caps capsule Commonly known as:  FLOMAX Take 0.4 mg by mouth daily after supper.   traMADol 50 MG tablet Commonly known as:  Ultram Take 1 tablet (50 mg total) by mouth 2 (two) times daily for 3 days.            Durable Medical Equipment  (From admission, onward)         Start     Ordered   07/11/18 1101  For home use only DME Walker rolling  Once    Question:  Patient needs a walker to treat with the following condition  Answer:  Hip fracture ([place])   07/11/18 1101          Allergies  Allergen Reactions  . Rosuvastatin Calcium Other (See Comments)    Myalgias  . Iohexol Hives    pt ok with premedication (Benadryl 50mg  PO), Onset Date: [id number]   . Statins Other (See Comments)    Myalgias     Consultations:  Orthopedics  Cardiology   Procedures/Studies: Dg Chest 1 View  Result Date: 07/08/2018 CLINICAL DATA:  Fall from ladder with chest pain, initial encounter EXAM: CHEST  1 VIEW COMPARISON:  07/04/2018 FINDINGS: Cardiac shadow is within normal limits. Postsurgical changes. The lungs are well aerated with mild left basilar atelectasis. No focal infiltrate is seen. No acute bony is noted. IMPRESSION: Mild left basilar atelectasis. Electronically Signed   By: Inez Catalina M.D.   On: 07/08/2018 21:02   Dg Chest 2 View  Result Date: 07/04/2018 CLINICAL DATA:  Increasing shortness of breath over the past month. EXAM: CHEST - 2 VIEW COMPARISON:  CT chest dated March 01, 2018. Chest x-ray dated January 29, 2018. FINDINGS: The heart size and mediastinal  contours are within normal limits. Prior CABG. Normal pulmonary vascularity. Unchanged elevation of the right hemidiaphragm. Persistent low lung volumes. No focal consolidation, pleural effusion, or pneumothorax. No acute osseous abnormality. Air within the colon hepatic flexure underneath the right hemidiaphragm. IMPRESSION: No active cardiopulmonary disease. Electronically Signed   By: Titus Dubin M.D.   On: 07/04/2018 13:04   Ct Angio Chest Pe W And/or Wo Contrast  Result Date: 07/09/2018 CLINICAL DATA:  Initial evaluation for acute chest pain,  abdominal pain. EXAM: CT ANGIOGRAPHY CHEST CT ABDOMEN AND PELVIS WITH CONTRAST TECHNIQUE: Multidetector CT imaging of the chest was performed using the standard protocol during bolus administration of intravenous contrast. Multiplanar CT image reconstructions and MIPs were obtained to evaluate the vascular anatomy. Multidetector CT imaging of the abdomen and pelvis was performed using the standard protocol during bolus administration of intravenous contrast. CONTRAST:  169mL OMNIPAQUE IOHEXOL 350 MG/ML SOLN COMPARISON:  Prior CT from 03/01/2018 FINDINGS: CTA CHEST FINDINGS Cardiovascular: Intrathoracic aorta of normal caliber without aneurysm or other acute abnormality. Moderate aortic atherosclerosis. Visualized great vessels tortuous but within normal limits. Mild cardiomegaly no pericardial effusion. Pulmonary arterial tree adequately opacified for evaluation. Main pulmonary artery dilated up to 3.4 cm, suggesting underlying pulmonary hypertension. No filling defect to suggest acute pulmonary embolism. Re-formatted imaging confirms these findings. Mediastinum/Nodes: Visualized thyroid within normal limits. No enlarged mediastinal, hilar, or axillary lymph nodes. Esophagus within normal limits. Lungs/Pleura: Tracheobronchial tree intact and patent. Diffuse central airway thickening noted. Lungs hypoinflated bilaterally with elevation of the hemidiaphragms. Trace  layering bilateral pleural effusions, left slightly larger than right. Associated bibasilar atelectasis. No other focal infiltrates. No pulmonary edema. No pneumothorax. Few scattered subpleural subcentimeter nodular densities measuring up to 5 mm noted within the peripheral upper lobes bilaterally, indeterminate. Musculoskeletal: Median sternotomy wires noted. No acute osseous abnormality. No worrisome lytic or blastic osseous lesions. Review of the MIP images confirms the above findings. CT ABDOMEN and PELVIS FINDINGS Hepatobiliary: Liver demonstrates a normal contrast enhanced appearance. Punctate granuloma noted within the caudate lobe. Gallbladder surgically absent. No biliary dilatation. Pancreas: Sequelae of prior distal pancreatectomy noted. Pancreas otherwise unremarkable. Spleen: Spleen is absent. Adrenals/Urinary Tract: Adrenal glands within normal limits. Kidneys equal in size with symmetric enhancement. Focal cortical thinning and scarring noted at the lower pole the right kidney. 6 mm nonobstructive stone present at the lower pole the right kidney. No other radiopaque calculi. No hydronephrosis or hydroureter. No focal enhancing renal mass. Partially distended bladder within normal limits. Stomach/Bowel: Stomach decompressed without acute finding. No evidence for bowel obstruction. Large volume retained stool throughout the colon, suggesting constipation. Negative appendix. No acute inflammatory changes seen about the bowels. Vascular/Lymphatic: Advanced aorto bi-iliac atherosclerotic disease. No aneurysm. Mesenteric vessels patent proximally. Two right-sided renal arteries noted. Retroaortic left renal vein noted as well. No adenopathy. Reproductive: Enlarged prostate measuring 6.3 cm in transverse diameter. Other: No free air or fluid. Musculoskeletal: Additional views of the left hip demonstrate an acute nondisplaced fracture through the superior and posterior aspect of the left acetabulum (series  6, image 25). Subtle medial extension towards the iliopectineal line (series 10, image 42). Anterior column intact. Left femoral head remains normally positioned and remains intact. Femoral neck and visualized proximal femur intact. Remainder of the visualized pelvis intact. No pubic diastasis. SI joints approximated. No other acute osseous abnormality. No discrete lytic or blastic osseous lesions. Patient status post posterior decompression at L2 through L4. Moderate to advanced multilevel degenerative spondylolysis throughout the lumbar spine. Review of the MIP images confirms the above findings. IMPRESSION: CTA CHEST IMPRESSION 1. No CT evidence for acute pulmonary embolism. 2. Low lung volumes with associated trace layering bilateral pleural effusions and bibasilar atelectasis. No other active cardiopulmonary disease. 3. Central airway thickening with dilatation of the main pulmonary artery up to 3.4 cm, suggesting a degree of underlying pulmonary hypertension. CT ABDOMEN AND PELVIS IMPRESSION 1. Acute nondisplaced left acetabular fracture as above. 2. No other acute abnormality within the abdomen and pelvis.  3. 6 mm nonobstructive right renal nephrolithiasis. 4. Large volume retained stool throughout the colon, suggesting constipation. 5. Advanced atherosclerosis with diffuse 3 vessel coronary artery calcifications. Electronically Signed   By: Jeannine Boga M.D.   On: 07/09/2018 03:35   Ct Abdomen Pelvis W Contrast  Result Date: 07/09/2018 CLINICAL DATA:  Initial evaluation for acute chest pain, abdominal pain. EXAM: CT ANGIOGRAPHY CHEST CT ABDOMEN AND PELVIS WITH CONTRAST TECHNIQUE: Multidetector CT imaging of the chest was performed using the standard protocol during bolus administration of intravenous contrast. Multiplanar CT image reconstructions and MIPs were obtained to evaluate the vascular anatomy. Multidetector CT imaging of the abdomen and pelvis was performed using the standard protocol  during bolus administration of intravenous contrast. CONTRAST:  122mL OMNIPAQUE IOHEXOL 350 MG/ML SOLN COMPARISON:  Prior CT from 03/01/2018 FINDINGS: CTA CHEST FINDINGS Cardiovascular: Intrathoracic aorta of normal caliber without aneurysm or other acute abnormality. Moderate aortic atherosclerosis. Visualized great vessels tortuous but within normal limits. Mild cardiomegaly no pericardial effusion. Pulmonary arterial tree adequately opacified for evaluation. Main pulmonary artery dilated up to 3.4 cm, suggesting underlying pulmonary hypertension. No filling defect to suggest acute pulmonary embolism. Re-formatted imaging confirms these findings. Mediastinum/Nodes: Visualized thyroid within normal limits. No enlarged mediastinal, hilar, or axillary lymph nodes. Esophagus within normal limits. Lungs/Pleura: Tracheobronchial tree intact and patent. Diffuse central airway thickening noted. Lungs hypoinflated bilaterally with elevation of the hemidiaphragms. Trace layering bilateral pleural effusions, left slightly larger than right. Associated bibasilar atelectasis. No other focal infiltrates. No pulmonary edema. No pneumothorax. Few scattered subpleural subcentimeter nodular densities measuring up to 5 mm noted within the peripheral upper lobes bilaterally, indeterminate. Musculoskeletal: Median sternotomy wires noted. No acute osseous abnormality. No worrisome lytic or blastic osseous lesions. Review of the MIP images confirms the above findings. CT ABDOMEN and PELVIS FINDINGS Hepatobiliary: Liver demonstrates a normal contrast enhanced appearance. Punctate granuloma noted within the caudate lobe. Gallbladder surgically absent. No biliary dilatation. Pancreas: Sequelae of prior distal pancreatectomy noted. Pancreas otherwise unremarkable. Spleen: Spleen is absent. Adrenals/Urinary Tract: Adrenal glands within normal limits. Kidneys equal in size with symmetric enhancement. Focal cortical thinning and scarring noted  at the lower pole the right kidney. 6 mm nonobstructive stone present at the lower pole the right kidney. No other radiopaque calculi. No hydronephrosis or hydroureter. No focal enhancing renal mass. Partially distended bladder within normal limits. Stomach/Bowel: Stomach decompressed without acute finding. No evidence for bowel obstruction. Large volume retained stool throughout the colon, suggesting constipation. Negative appendix. No acute inflammatory changes seen about the bowels. Vascular/Lymphatic: Advanced aorto bi-iliac atherosclerotic disease. No aneurysm. Mesenteric vessels patent proximally. Two right-sided renal arteries noted. Retroaortic left renal vein noted as well. No adenopathy. Reproductive: Enlarged prostate measuring 6.3 cm in transverse diameter. Other: No free air or fluid. Musculoskeletal: Additional views of the left hip demonstrate an acute nondisplaced fracture through the superior and posterior aspect of the left acetabulum (series 6, image 25). Subtle medial extension towards the iliopectineal line (series 10, image 42). Anterior column intact. Left femoral head remains normally positioned and remains intact. Femoral neck and visualized proximal femur intact. Remainder of the visualized pelvis intact. No pubic diastasis. SI joints approximated. No other acute osseous abnormality. No discrete lytic or blastic osseous lesions. Patient status post posterior decompression at L2 through L4. Moderate to advanced multilevel degenerative spondylolysis throughout the lumbar spine. Review of the MIP images confirms the above findings. IMPRESSION: CTA CHEST IMPRESSION 1. No CT evidence for acute pulmonary embolism. 2. Low lung  volumes with associated trace layering bilateral pleural effusions and bibasilar atelectasis. No other active cardiopulmonary disease. 3. Central airway thickening with dilatation of the main pulmonary artery up to 3.4 cm, suggesting a degree of underlying pulmonary  hypertension. CT ABDOMEN AND PELVIS IMPRESSION 1. Acute nondisplaced left acetabular fracture as above. 2. No other acute abnormality within the abdomen and pelvis. 3. 6 mm nonobstructive right renal nephrolithiasis. 4. Large volume retained stool throughout the colon, suggesting constipation. 5. Advanced atherosclerosis with diffuse 3 vessel coronary artery calcifications. Electronically Signed   By: Jeannine Boga M.D.   On: 07/09/2018 03:35   Ct No Charge  Result Date: 07/09/2018 CLINICAL DATA:  Initial evaluation for acute chest pain, abdominal pain. EXAM: CT ANGIOGRAPHY CHEST CT ABDOMEN AND PELVIS WITH CONTRAST TECHNIQUE: Multidetector CT imaging of the chest was performed using the standard protocol during bolus administration of intravenous contrast. Multiplanar CT image reconstructions and MIPs were obtained to evaluate the vascular anatomy. Multidetector CT imaging of the abdomen and pelvis was performed using the standard protocol during bolus administration of intravenous contrast. CONTRAST:  144mL OMNIPAQUE IOHEXOL 350 MG/ML SOLN COMPARISON:  Prior CT from 03/01/2018 FINDINGS: CTA CHEST FINDINGS Cardiovascular: Intrathoracic aorta of normal caliber without aneurysm or other acute abnormality. Moderate aortic atherosclerosis. Visualized great vessels tortuous but within normal limits. Mild cardiomegaly no pericardial effusion. Pulmonary arterial tree adequately opacified for evaluation. Main pulmonary artery dilated up to 3.4 cm, suggesting underlying pulmonary hypertension. No filling defect to suggest acute pulmonary embolism. Re-formatted imaging confirms these findings. Mediastinum/Nodes: Visualized thyroid within normal limits. No enlarged mediastinal, hilar, or axillary lymph nodes. Esophagus within normal limits. Lungs/Pleura: Tracheobronchial tree intact and patent. Diffuse central airway thickening noted. Lungs hypoinflated bilaterally with elevation of the hemidiaphragms. Trace  layering bilateral pleural effusions, left slightly larger than right. Associated bibasilar atelectasis. No other focal infiltrates. No pulmonary edema. No pneumothorax. Few scattered subpleural subcentimeter nodular densities measuring up to 5 mm noted within the peripheral upper lobes bilaterally, indeterminate. Musculoskeletal: Median sternotomy wires noted. No acute osseous abnormality. No worrisome lytic or blastic osseous lesions. Review of the MIP images confirms the above findings. CT ABDOMEN and PELVIS FINDINGS Hepatobiliary: Liver demonstrates a normal contrast enhanced appearance. Punctate granuloma noted within the caudate lobe. Gallbladder surgically absent. No biliary dilatation. Pancreas: Sequelae of prior distal pancreatectomy noted. Pancreas otherwise unremarkable. Spleen: Spleen is absent. Adrenals/Urinary Tract: Adrenal glands within normal limits. Kidneys equal in size with symmetric enhancement. Focal cortical thinning and scarring noted at the lower pole the right kidney. 6 mm nonobstructive stone present at the lower pole the right kidney. No other radiopaque calculi. No hydronephrosis or hydroureter. No focal enhancing renal mass. Partially distended bladder within normal limits. Stomach/Bowel: Stomach decompressed without acute finding. No evidence for bowel obstruction. Large volume retained stool throughout the colon, suggesting constipation. Negative appendix. No acute inflammatory changes seen about the bowels. Vascular/Lymphatic: Advanced aorto bi-iliac atherosclerotic disease. No aneurysm. Mesenteric vessels patent proximally. Two right-sided renal arteries noted. Retroaortic left renal vein noted as well. No adenopathy. Reproductive: Enlarged prostate measuring 6.3 cm in transverse diameter. Other: No free air or fluid. Musculoskeletal: Additional views of the left hip demonstrate an acute nondisplaced fracture through the superior and posterior aspect of the left acetabulum (series  6, image 25). Subtle medial extension towards the iliopectineal line (series 10, image 42). Anterior column intact. Left femoral head remains normally positioned and remains intact. Femoral neck and visualized proximal femur intact. Remainder of the visualized pelvis intact. No  pubic diastasis. SI joints approximated. No other acute osseous abnormality. No discrete lytic or blastic osseous lesions. Patient status post posterior decompression at L2 through L4. Moderate to advanced multilevel degenerative spondylolysis throughout the lumbar spine. Review of the MIP images confirms the above findings. IMPRESSION: CTA CHEST IMPRESSION 1. No CT evidence for acute pulmonary embolism. 2. Low lung volumes with associated trace layering bilateral pleural effusions and bibasilar atelectasis. No other active cardiopulmonary disease. 3. Central airway thickening with dilatation of the main pulmonary artery up to 3.4 cm, suggesting a degree of underlying pulmonary hypertension. CT ABDOMEN AND PELVIS IMPRESSION 1. Acute nondisplaced left acetabular fracture as above. 2. No other acute abnormality within the abdomen and pelvis. 3. 6 mm nonobstructive right renal nephrolithiasis. 4. Large volume retained stool throughout the colon, suggesting constipation. 5. Advanced atherosclerosis with diffuse 3 vessel coronary artery calcifications. Electronically Signed   By: Jeannine Boga M.D.   On: 07/09/2018 03:35   Dg Hip Unilat W Or Wo Pelvis 2-3 Views Left  Result Date: 07/08/2018 CLINICAL DATA:  Recent fall from ladder with left hip pain, initial encounter EXAM: DG HIP (WITH OR WITHOUT PELVIS) 3V LEFT COMPARISON:  05/19/2014 FINDINGS: Pelvic ring is intact. Degenerative changes of lumbar spine and hip joints are noted. No acute fractures noted. IMPRESSION: No fracture identified. Electronically Signed   By: Inez Catalina M.D.   On: 07/08/2018 21:03    (Echo, Carotid, EGD, Colonoscopy, ERCP)    Subjective: "I don't think  I need to stay in the hospital for this. I can do it at home."  Discharge Exam: Vitals:   07/11/18 0910 07/11/18 1236  BP: (!) 145/68 138/64  Pulse: 67 66  Resp: 16 18  Temp:  97.7 F (36.5 C)  SpO2:  97%   Vitals:   07/11/18 0420 07/11/18 0908 07/11/18 0910 07/11/18 1236  BP: (!) 141/70  (!) 145/68 138/64  Pulse: 64  67 66  Resp: 18  16 18   Temp: 97.9 F (36.6 C)   97.7 F (36.5 C)  TempSrc: Oral   Oral  SpO2: 94%   97%  Weight:  69.5 kg    Height:        General exam: 78 y.o. male Appears calm and comfortable  Respiratory system: Clear to auscultation. Respiratory effort normal. Cardiovascular system: S1 & S2 heard, RRR. No JVD, murmurs, rubs, gallops or clicks. No pedal edema. Gastrointestinal system: Abdomen is nondistended, soft and nontender. No organomegaly or masses felt. Normal bowel sounds heard. Central nervous system: Alert and oriented. No focal neurological deficits. Skin: No rashes, lesions or ulcers Psychiatry: Judgement and insight appear normal. Mood & affect appropriate.     The results of significant diagnostics from this hospitalization (including imaging, microbiology, ancillary and laboratory) are listed below for reference.     Microbiology: Recent Results (from the past 240 hour(s))  SARS Coronavirus 2 (CEPHEID - Performed in [place] hospital lab), Hosp Order     Status: None   Collection Time: 07/08/18  9:33 PM  Result Value Ref Range Status   SARS Coronavirus 2 NEGATIVE NEGATIVE Final    Comment: (NOTE) If result is NEGATIVE SARS-CoV-2 target nucleic acids are NOT DETECTED. The SARS-CoV-2 RNA is generally detectable in upper and lower  respiratory specimens during the acute phase of infection. The lowest  concentration of SARS-CoV-2 viral copies this assay can detect is 250  copies / mL. A negative result does not preclude SARS-CoV-2 infection  and should not be used  as the sole basis for treatment or other  patient management  decisions.  A negative result may occur with  improper specimen collection / handling, submission of specimen other  than nasopharyngeal swab, presence of viral mutation(s) within the  areas targeted by this assay, and inadequate number of viral copies  (<250 copies / mL). A negative result must be combined with clinical  observations, patient history, and epidemiological information. If result is POSITIVE SARS-CoV-2 target nucleic acids are DETECTED. The SARS-CoV-2 RNA is generally detectable in upper and lower  respiratory specimens dur ing the acute phase of infection.  Positive  results are indicative of active infection with SARS-CoV-2.  Clinical  correlation with patient history and other diagnostic information is  necessary to determine patient infection status.  Positive results do  not rule out bacterial infection or co-infection with other viruses. If result is PRESUMPTIVE POSTIVE SARS-CoV-2 nucleic acids MAY BE PRESENT.   A presumptive positive result was obtained on the submitted specimen  and confirmed on repeat testing.  While 2019 novel coronavirus  (SARS-CoV-2) nucleic acids may be present in the submitted sample  additional confirmatory testing may be necessary for epidemiological  and / or clinical management purposes  to differentiate between  SARS-CoV-2 and other Sarbecovirus currently known to infect humans.  If clinically indicated additional testing with an alternate test  methodology [phone]) is advised. The SARS-CoV-2 RNA is generally  detectable in upper and lower respiratory sp ecimens during the acute  phase of infection. The expected result is Negative. Fact Sheet for Patients:  StrictlyIdeas.no Fact Sheet for Healthcare Providers: BankingDealers.co.za This test is not yet approved or cleared by the [place] FDA and has been authorized for detection and/or diagnosis of SARS-CoV-2 by FDA under an  Emergency Use Authorization (EUA).  This EUA will remain in effect (meaning this test can be used) for the duration of the COVID-19 declaration under Section 564(b)(1) of the Act, 21 U.S.C. section 360bbb-3(b)(1), unless the authorization is terminated or revoked sooner. Performed at [facility], [place] [address] [place], [place] [address]      Labs: BNP (last 3 results) Recent Labs    07/08/18 1829  BNP 42.5   Basic Metabolic Panel: Recent Labs  Lab 07/08/18 1829 07/08/18 2215 07/11/18 0437  NA 134* 134* 137  K 4.2 3.8 3.7  CL 99  --  98  CO2 21*  --  32  GLUCOSE 189*  --  214*  BUN 27*  --  24*  CREATININE 1.04  --  1.05  CALCIUM 9.5  --  9.2  MG  --   --  2.0  PHOS  --   --  3.8   Liver Function Tests: Recent Labs  Lab 07/11/18 0437  ALBUMIN 3.3*   No results for input(s): LIPASE, AMYLASE in the last 168 hours. No results for input(s): AMMONIA in the last 168 hours. CBC: Recent Labs  Lab 07/08/18 1829 07/08/18 2215 07/11/18 0437  WBC 17.9*  --  11.7*  NEUTROABS  --   --  6.0  HGB 14.5 14.3 14.1  HCT 43.6 42.0 40.5  MCV 94.0  --  91.8  PLT 223  --  181   Cardiac Enzymes: Recent Labs  Lab 07/08/18 2346 07/09/18 0332 07/09/18 0703 07/09/18 1255 07/09/18 1824  TROPONINI 0.03* 0.13* 0.28* 0.99* 0.97*   BNP: Invalid input(s): POCBNP CBG: Recent Labs  Lab 07/10/18 1119 07/10/18 1603 07/10/18 2104 07/11/18 0626 07/11/18 1133  GLUCAP 207*  267* 216* 201* 273*   D-Dimer No results for input(s): DDIMER in the last 72 hours. Hgb A1c Recent Labs    07/09/18 1036  HGBA1C 7.3*   Lipid Profile No results for input(s): CHOL, HDL, LDLCALC, TRIG, CHOLHDL, LDLDIRECT in the last 72 hours. Thyroid function studies No results for input(s): TSH, T4TOTAL, T3FREE, THYROIDAB in the last 72 hours.  Invalid input(s): FREET3 Anemia work up No results for input(s): VITAMINB12, FOLATE, FERRITIN, TIBC, IRON, RETICCTPCT in the last 72  hours. Urinalysis    Component Value Date/Time   COLORURINE YELLOW 07/08/2018 2217   APPEARANCEUR CLEAR 07/08/2018 2217   LABSPEC 1.018 07/08/2018 2217   PHURINE 6.0 07/08/2018 2217   GLUCOSEU NEGATIVE 07/08/2018 2217   GLUCOSEU NEGATIVE 05/27/2009 1411   [place] 07/08/2018 2217   [place] NEGATIVE 07/08/2018 2217   [place] 07/08/2018 2217   PROTEINUR NEGATIVE 07/08/2018 2217   UROBILINOGEN 0.2 05/18/2014 1130   NITRITE NEGATIVE 07/08/2018 2217   LEUKOCYTESUR NEGATIVE 07/08/2018 2217   Sepsis Labs Invalid input(s): PROCALCITONIN,  WBC,  LACTICIDVEN Microbiology Recent Results (from the past 240 hour(s))  SARS Coronavirus 2 (CEPHEID - Performed in [place] [facility] lab), Hosp Order     Status: None   Collection Time: 07/08/18  9:33 PM  Result Value Ref Range Status   SARS Coronavirus 2 NEGATIVE NEGATIVE Final    Comment: (NOTE) If result is NEGATIVE SARS-CoV-2 target nucleic acids are NOT DETECTED. The SARS-CoV-2 RNA is generally detectable in upper and lower  respiratory specimens during the acute phase of infection. The lowest  concentration of SARS-CoV-2 viral copies this assay can detect is 250  copies / mL. A negative result does not preclude SARS-CoV-2 infection  and should not be used as the sole basis for treatment or other  patient management decisions.  A negative result may occur with  improper specimen collection / handling, submission of specimen other  than nasopharyngeal swab, presence of viral mutation(s) within the  areas targeted by this assay, and inadequate number of viral copies  (<250 copies / mL). A negative result must be combined with clinical  observations, patient history, and epidemiological information. If result is POSITIVE SARS-CoV-2 target nucleic acids are DETECTED. The SARS-CoV-2 RNA is generally detectable in upper and lower  respiratory specimens dur ing the acute phase of infection.  Positive  results are  indicative of active infection with SARS-CoV-2.  Clinical  correlation with patient history and other diagnostic information is  necessary to determine patient infection status.  Positive results do  not rule out bacterial infection or co-infection with other viruses. If result is PRESUMPTIVE POSTIVE SARS-CoV-2 nucleic acids MAY BE PRESENT.   A presumptive positive result was obtained on the submitted specimen  and confirmed on repeat testing.  While 2019 novel coronavirus  (SARS-CoV-2) nucleic acids may be present in the submitted sample  additional confirmatory testing may be necessary for epidemiological  and / or clinical management purposes  to differentiate between  SARS-CoV-2 and other Sarbecovirus currently known to infect humans.  If clinically indicated additional testing with an alternate test  methodology [phone]) is advised. The SARS-CoV-2 RNA is generally  detectable in upper and lower respiratory sp ecimens during the acute  phase of infection. The expected result is Negative. Fact Sheet for Patients:  StrictlyIdeas.no Fact Sheet for Healthcare Providers: BankingDealers.co.za This test is not yet approved or cleared by the [place] FDA and has been authorized for detection and/or diagnosis of SARS-CoV-2 by FDA  under an Emergency Use Authorization (EUA).  This EUA will remain in effect (meaning this test can be used) for the duration of the COVID-19 declaration under Section 564(b)(1) of the Act, 21 U.S.C. section 360bbb-3(b)(1), unless the authorization is terminated or revoked sooner. Performed at [facility], [place] [address], [place], [place] [address]      Time coordinating discharge: 35 minutes spent in the coordination of this discharge.   SIGNED:   Jonnie Finner, DO  [facility] 07/11/2018, 1:50 PM Pager   If 7PM-7AM, please contact night-coverage [email or web address] Password [email or web address]

## 2018-07-13 DIAGNOSIS — M519 Unspecified thoracic, thoracolumbar and lumbosacral intervertebral disc disorder: Secondary | ICD-10-CM | POA: Diagnosis not present

## 2018-07-13 DIAGNOSIS — M549 Dorsalgia, unspecified: Secondary | ICD-10-CM | POA: Diagnosis not present

## 2018-07-13 DIAGNOSIS — I10 Essential (primary) hypertension: Secondary | ICD-10-CM | POA: Diagnosis not present

## 2018-07-13 DIAGNOSIS — I25110 Atherosclerotic heart disease of native coronary artery with unstable angina pectoris: Secondary | ICD-10-CM | POA: Diagnosis not present

## 2018-07-13 DIAGNOSIS — Z9181 History of falling: Secondary | ICD-10-CM | POA: Diagnosis not present

## 2018-07-13 DIAGNOSIS — R002 Palpitations: Secondary | ICD-10-CM | POA: Diagnosis not present

## 2018-07-13 DIAGNOSIS — H9190 Unspecified hearing loss, unspecified ear: Secondary | ICD-10-CM | POA: Diagnosis not present

## 2018-07-13 DIAGNOSIS — Z794 Long term (current) use of insulin: Secondary | ICD-10-CM | POA: Diagnosis not present

## 2018-07-13 DIAGNOSIS — E1151 Type 2 diabetes mellitus with diabetic peripheral angiopathy without gangrene: Secondary | ICD-10-CM | POA: Diagnosis not present

## 2018-07-13 DIAGNOSIS — S32402D Unspecified fracture of left acetabulum, subsequent encounter for fracture with routine healing: Secondary | ICD-10-CM | POA: Diagnosis not present

## 2018-07-13 DIAGNOSIS — M5126 Other intervertebral disc displacement, lumbar region: Secondary | ICD-10-CM | POA: Diagnosis not present

## 2018-07-13 DIAGNOSIS — K8689 Other specified diseases of pancreas: Secondary | ICD-10-CM | POA: Diagnosis not present

## 2018-07-13 DIAGNOSIS — E785 Hyperlipidemia, unspecified: Secondary | ICD-10-CM | POA: Diagnosis not present

## 2018-07-13 DIAGNOSIS — M47812 Spondylosis without myelopathy or radiculopathy, cervical region: Secondary | ICD-10-CM | POA: Diagnosis not present

## 2018-07-13 DIAGNOSIS — Z951 Presence of aortocoronary bypass graft: Secondary | ICD-10-CM | POA: Diagnosis not present

## 2018-07-13 DIAGNOSIS — Z7982 Long term (current) use of aspirin: Secondary | ICD-10-CM | POA: Diagnosis not present

## 2018-07-15 ENCOUNTER — Telehealth: Payer: Self-pay | Admitting: Cardiovascular Disease

## 2018-07-15 NOTE — Telephone Encounter (Signed)

## 2018-07-15 NOTE — Telephone Encounter (Signed)
He may stop the Ranexa for a week and see how he feels.  He has seen Dr.Nishan and Ellen Henri, PA since he was seen by me for shortness of breath and Ranexa was restarted at my visit (had been on previously).   He was referred to pulmonary - does not look like this has been scheduled.  Can we send a message to the schedulers to get this facilitated/scheduled?

## 2018-07-15 NOTE — Telephone Encounter (Signed)
Noted - symptoms are worrisome - apparently he was dizzy while on a roof/ladder and fell - has shoulder fracture.   Will stop Ranexa. May see increase in angina - ok to use NTG prn.  See back for visit.  May need further evaluation with loop/EP.

## 2018-07-15 NOTE — Telephone Encounter (Signed)
S/w pt's wife per (DPR) pt is going to stop Ranexa for one week to see how pt feels, discussed pt might have chest pain and can take  Nitro if needed.  Pt is dizzy,weak,nausea,sob,constipated has a headache but no fever.  Lori want's pt to have a telehealth visit June 10 will call back to schedule.  Sent schedulers a message to get pt in to pulmonology. Will send to [place] to [place].

## 2018-07-15 NOTE — Telephone Encounter (Signed)
  Pt c/o medication issue:  1. Name of Medication: ranolazine (RANEXA) 500 MG 12 hr tablet  2. How are you currently taking this medication (dosage and times per day)? Take 1 tablet (500 mg total) by mouth 2 (two) times daily.  3. Are you having a reaction (difficulty breathing--STAT)? Not really. Some SOB on occasion  4. What is your medication issue? Patient is having headache, dizziness, fatigue, little SOB and nausea and they think it is coming from this medication. Wife is wondering if there is a lower dosage he can take or another medication.

## 2018-07-15 NOTE — Telephone Encounter (Signed)
Per Truitt Merle, NP pt restarted Ranexa 06/10/18.

## 2018-07-20 ENCOUNTER — Other Ambulatory Visit: Payer: Self-pay

## 2018-07-20 ENCOUNTER — Encounter (HOSPITAL_COMMUNITY): Payer: Self-pay | Admitting: Emergency Medicine

## 2018-07-20 ENCOUNTER — Inpatient Hospital Stay (HOSPITAL_COMMUNITY)
Admission: EM | Admit: 2018-07-20 | Discharge: 2018-07-23 | DRG: 066 | Disposition: A | Discharge status: Rehabilitation Facility | Payer: PPO | Attending: Internal Medicine | Admitting: Internal Medicine

## 2018-07-20 ENCOUNTER — Emergency Department (HOSPITAL_COMMUNITY): Payer: PPO

## 2018-07-20 DIAGNOSIS — Z79899 Other long term (current) drug therapy: Secondary | ICD-10-CM

## 2018-07-20 DIAGNOSIS — Z8701 Personal history of pneumonia (recurrent): Secondary | ICD-10-CM

## 2018-07-20 DIAGNOSIS — R079 Chest pain, unspecified: Secondary | ICD-10-CM | POA: Diagnosis present

## 2018-07-20 DIAGNOSIS — E1151 Type 2 diabetes mellitus with diabetic peripheral angiopathy without gangrene: Secondary | ICD-10-CM | POA: Diagnosis present

## 2018-07-20 DIAGNOSIS — R0902 Hypoxemia: Secondary | ICD-10-CM | POA: Diagnosis not present

## 2018-07-20 DIAGNOSIS — F0781 Postconcussional syndrome: Secondary | ICD-10-CM | POA: Diagnosis not present

## 2018-07-20 DIAGNOSIS — R55 Syncope and collapse: Secondary | ICD-10-CM

## 2018-07-20 DIAGNOSIS — E7800 Pure hypercholesterolemia, unspecified: Secondary | ICD-10-CM | POA: Diagnosis not present

## 2018-07-20 DIAGNOSIS — Z9081 Acquired absence of spleen: Secondary | ICD-10-CM | POA: Diagnosis not present

## 2018-07-20 DIAGNOSIS — I615 Nontraumatic intracerebral hemorrhage, intraventricular: Secondary | ICD-10-CM | POA: Diagnosis not present

## 2018-07-20 DIAGNOSIS — Z87442 Personal history of urinary calculi: Secondary | ICD-10-CM | POA: Diagnosis not present

## 2018-07-20 DIAGNOSIS — K5900 Constipation, unspecified: Secondary | ICD-10-CM | POA: Diagnosis not present

## 2018-07-20 DIAGNOSIS — S32402A Unspecified fracture of left acetabulum, initial encounter for closed fracture: Secondary | ICD-10-CM | POA: Diagnosis not present

## 2018-07-20 DIAGNOSIS — I10 Essential (primary) hypertension: Secondary | ICD-10-CM | POA: Diagnosis present

## 2018-07-20 DIAGNOSIS — L89621 Pressure ulcer of left heel, stage 1: Secondary | ICD-10-CM | POA: Diagnosis not present

## 2018-07-20 DIAGNOSIS — Z951 Presence of aortocoronary bypass graft: Secondary | ICD-10-CM | POA: Diagnosis not present

## 2018-07-20 DIAGNOSIS — I629 Nontraumatic intracranial hemorrhage, unspecified: Secondary | ICD-10-CM | POA: Diagnosis not present

## 2018-07-20 DIAGNOSIS — W19XXXD Unspecified fall, subsequent encounter: Secondary | ICD-10-CM | POA: Diagnosis present

## 2018-07-20 DIAGNOSIS — Z981 Arthrodesis status: Secondary | ICD-10-CM | POA: Diagnosis not present

## 2018-07-20 DIAGNOSIS — R4189 Other symptoms and signs involving cognitive functions and awareness: Secondary | ICD-10-CM | POA: Diagnosis not present

## 2018-07-20 DIAGNOSIS — I208 Other forms of angina pectoris: Secondary | ICD-10-CM

## 2018-07-20 DIAGNOSIS — S32402D Unspecified fracture of left acetabulum, subsequent encounter for fracture with routine healing: Secondary | ICD-10-CM

## 2018-07-20 DIAGNOSIS — L89611 Pressure ulcer of right heel, stage 1: Secondary | ICD-10-CM | POA: Diagnosis not present

## 2018-07-20 DIAGNOSIS — Z8249 Family history of ischemic heart disease and other diseases of the circulatory system: Secondary | ICD-10-CM | POA: Diagnosis not present

## 2018-07-20 DIAGNOSIS — H9190 Unspecified hearing loss, unspecified ear: Secondary | ICD-10-CM | POA: Diagnosis present

## 2018-07-20 DIAGNOSIS — E785 Hyperlipidemia, unspecified: Secondary | ICD-10-CM | POA: Diagnosis present

## 2018-07-20 DIAGNOSIS — Z1159 Encounter for screening for other viral diseases: Secondary | ICD-10-CM | POA: Diagnosis not present

## 2018-07-20 DIAGNOSIS — E1159 Type 2 diabetes mellitus with other circulatory complications: Secondary | ICD-10-CM | POA: Diagnosis not present

## 2018-07-20 DIAGNOSIS — W19XXXA Unspecified fall, initial encounter: Secondary | ICD-10-CM | POA: Diagnosis not present

## 2018-07-20 DIAGNOSIS — S06349D Traumatic hemorrhage of right cerebrum with loss of consciousness of unspecified duration, subsequent encounter: Secondary | ICD-10-CM | POA: Diagnosis not present

## 2018-07-20 DIAGNOSIS — Z7982 Long term (current) use of aspirin: Secondary | ICD-10-CM | POA: Diagnosis not present

## 2018-07-20 DIAGNOSIS — Z794 Long term (current) use of insulin: Secondary | ICD-10-CM

## 2018-07-20 DIAGNOSIS — E119 Type 2 diabetes mellitus without complications: Secondary | ICD-10-CM | POA: Diagnosis present

## 2018-07-20 DIAGNOSIS — R51 Headache: Secondary | ICD-10-CM | POA: Diagnosis not present

## 2018-07-20 DIAGNOSIS — S06359D Traumatic hemorrhage of left cerebrum with loss of consciousness of unspecified duration, subsequent encounter: Secondary | ICD-10-CM | POA: Diagnosis not present

## 2018-07-20 DIAGNOSIS — K219 Gastro-esophageal reflux disease without esophagitis: Secondary | ICD-10-CM | POA: Diagnosis present

## 2018-07-20 DIAGNOSIS — Z888 Allergy status to other drugs, medicaments and biological substances status: Secondary | ICD-10-CM

## 2018-07-20 DIAGNOSIS — N400 Enlarged prostate without lower urinary tract symptoms: Secondary | ICD-10-CM | POA: Diagnosis not present

## 2018-07-20 DIAGNOSIS — M5126 Other intervertebral disc displacement, lumbar region: Secondary | ICD-10-CM | POA: Diagnosis not present

## 2018-07-20 DIAGNOSIS — I2510 Atherosclerotic heart disease of native coronary artery without angina pectoris: Secondary | ICD-10-CM | POA: Diagnosis present

## 2018-07-20 DIAGNOSIS — Y92009 Unspecified place in unspecified non-institutional (private) residence as the place of occurrence of the external cause: Secondary | ICD-10-CM | POA: Diagnosis not present

## 2018-07-20 DIAGNOSIS — R5383 Other fatigue: Secondary | ICD-10-CM | POA: Diagnosis not present

## 2018-07-20 DIAGNOSIS — G44309 Post-traumatic headache, unspecified, not intractable: Secondary | ICD-10-CM | POA: Diagnosis present

## 2018-07-20 DIAGNOSIS — I609 Nontraumatic subarachnoid hemorrhage, unspecified: Secondary | ICD-10-CM | POA: Diagnosis not present

## 2018-07-20 DIAGNOSIS — J9811 Atelectasis: Secondary | ICD-10-CM | POA: Diagnosis not present

## 2018-07-20 DIAGNOSIS — Z833 Family history of diabetes mellitus: Secondary | ICD-10-CM | POA: Diagnosis not present

## 2018-07-20 DIAGNOSIS — E860 Dehydration: Secondary | ICD-10-CM | POA: Diagnosis not present

## 2018-07-20 DIAGNOSIS — E1165 Type 2 diabetes mellitus with hyperglycemia: Secondary | ICD-10-CM | POA: Diagnosis not present

## 2018-07-20 DIAGNOSIS — L89152 Pressure ulcer of sacral region, stage 2: Secondary | ICD-10-CM | POA: Diagnosis not present

## 2018-07-20 DIAGNOSIS — Z885 Allergy status to narcotic agent status: Secondary | ICD-10-CM

## 2018-07-20 DIAGNOSIS — S066X0A Traumatic subarachnoid hemorrhage without loss of consciousness, initial encounter: Secondary | ICD-10-CM | POA: Diagnosis not present

## 2018-07-20 DIAGNOSIS — S3289XA Fracture of other parts of pelvis, initial encounter for closed fracture: Secondary | ICD-10-CM | POA: Diagnosis not present

## 2018-07-20 LAB — CBC WITH DIFFERENTIAL/PLATELET
Abs Immature Granulocytes: 0.12 10*3/uL — ABNORMAL HIGH (ref 0.00–0.07)
Basophils Absolute: 0.1 10*3/uL (ref 0.0–0.1)
Basophils Relative: 1 %
Eosinophils Absolute: 0 10*3/uL (ref 0.0–0.5)
Eosinophils Relative: 0 %
HCT: 42.6 % (ref 39.0–52.0)
Hemoglobin: 14.7 g/dL (ref 13.0–17.0)
Immature Granulocytes: 1 %
Lymphocytes Relative: 5 %
Lymphs Abs: 1 10*3/uL (ref 0.7–4.0)
MCH: 31.6 pg (ref 26.0–34.0)
MCHC: 34.5 g/dL (ref 30.0–36.0)
MCV: 91.6 fL (ref 80.0–100.0)
Monocytes Absolute: 1.2 10*3/uL — ABNORMAL HIGH (ref 0.1–1.0)
Monocytes Relative: 6 %
Neutro Abs: 17.6 10*3/uL — ABNORMAL HIGH (ref 1.7–7.7)
Neutrophils Relative %: 87 %
Platelets: 328 10*3/uL (ref 150–400)
RBC: 4.65 MIL/uL (ref 4.22–5.81)
RDW: 12.1 % (ref 11.5–15.5)
WBC: 20.1 10*3/uL — ABNORMAL HIGH (ref 4.0–10.5)
nRBC: 0 % (ref 0.0–0.2)

## 2018-07-20 LAB — URINALYSIS, ROUTINE W REFLEX MICROSCOPIC
Bilirubin Urine: NEGATIVE
Glucose, UA: >=500 mg/dL — AB
Hgb urine dipstick: NEGATIVE
Ketones, ur: 5 mg/dL — AB
Leukocytes,Ua: NEGATIVE
Nitrite: NEGATIVE
Protein, ur: NEGATIVE mg/dL
Specific Gravity, Urine: 1.015 (ref 1.005–1.030)
pH: 5 (ref 5.0–8.0)

## 2018-07-20 LAB — COMPREHENSIVE METABOLIC PANEL
ALT: 71 U/L — ABNORMAL HIGH (ref 0–44)
AST: 46 U/L — ABNORMAL HIGH (ref 15–41)
Albumin: 3.3 g/dL — ABNORMAL LOW (ref 3.5–5.0)
Alkaline Phosphatase: 129 U/L — ABNORMAL HIGH (ref 38–126)
Anion gap: 15 (ref 5–15)
BUN: 20 mg/dL (ref 8–23)
CO2: 24 mmol/L (ref 22–32)
Calcium: 9.1 mg/dL (ref 8.9–10.3)
Chloride: 91 mmol/L — ABNORMAL LOW (ref 98–111)
Creatinine, Ser: 1.01 mg/dL (ref 0.61–1.24)
GFR calc Af Amer: >60 mL/min (ref >60)
GFR calc non Af Amer: >60 mL/min (ref >60)
Glucose, Bld: 330 mg/dL — ABNORMAL HIGH (ref 70–99)
Potassium: 3.7 mmol/L (ref 3.5–5.1)
Sodium: 130 mmol/L — ABNORMAL LOW (ref 135–145)
Total Bilirubin: 1.1 mg/dL (ref 0.3–1.2)
Total Protein: 7.4 g/dL (ref 6.5–8.1)

## 2018-07-20 LAB — TROPONIN I: Troponin I: <0.03 ng/mL (ref <0.03)

## 2018-07-20 LAB — SARS CORONAVIRUS 2 BY RT PCR (HOSPITAL ORDER, PERFORMED IN ~~LOC~~ HOSPITAL LAB): SARS Coronavirus 2: NEGATIVE

## 2018-07-20 MED ORDER — INSULIN GLARGINE 100 UNIT/ML ~~LOC~~ SOLN
15.0000 [IU] | Freq: Every day | SUBCUTANEOUS | Status: DC
  Administered 2018-07-21 (×2): 15 [IU] via SUBCUTANEOUS
  Filled 2018-07-20 (×4): qty 0.15

## 2018-07-20 MED ORDER — SODIUM CHLORIDE 0.9% FLUSH: 3.0000 mL | INTRAVENOUS | Status: DC | PRN

## 2018-07-20 MED ORDER — INSULIN ASPART 100 UNIT/ML ~~LOC~~ SOLN
0.0000 [IU] | Freq: Three times a day (TID) | SUBCUTANEOUS | Status: DC
  Administered 2018-07-21: 8 [IU] via SUBCUTANEOUS
  Administered 2018-07-21: 2 [IU] via SUBCUTANEOUS
  Administered 2018-07-21 – 2018-07-22 (×3): 5 [IU] via SUBCUTANEOUS
  Administered 2018-07-23 (×3): 2 [IU] via SUBCUTANEOUS

## 2018-07-20 NOTE — ED Provider Notes (Addendum)
AMS. Awaits labs.  Near syncope.  Recent admission  8:04 PM Neurosurgery have evaluated pt and recommend medicine to admit for further care.    8:52 PM Appreciate consultation from [facility] Dr. Veverly Fells who agrees to see and admit pt.  Pt will benefit from repeat head CT tomorrow, obs overnight.   BP (!) 145/64 (BP Location: Right Arm)   Pulse 80   Temp 98.3 F (36.8 C) (Oral)   Resp 19   SpO2 100%   Results for orders placed or performed during the hospital encounter of 07/20/18  Comprehensive metabolic panel  Result Value Ref Range   Sodium 130 (L) 135 - 145 mmol/L   Potassium 3.7 3.5 - 5.1 mmol/L   Chloride 91 (L) 98 - 111 mmol/L   CO2 24 22 - 32 mmol/L   Glucose, Bld 330 (H) 70 - 99 mg/dL   BUN 20 8 - 23 mg/dL   Creatinine, Ser 1.01 0.61 - 1.24 mg/dL   Calcium 9.1 8.9 - 10.3 mg/dL   Total Protein 7.4 6.5 - 8.1 g/dL   Albumin 3.3 (L) 3.5 - 5.0 g/dL   AST 46 (H) 15 - 41 U/L   ALT 71 (H) 0 - 44 U/L   Alkaline Phosphatase 129 (H) 38 - 126 U/L   Total Bilirubin 1.1 0.3 - 1.2 mg/dL   GFR calc non Af Amer >60 >60 mL/min   GFR calc Af Amer >60 >60 mL/min   Anion gap 15 5 - 15  Troponin I - Once  Result Value Ref Range   Troponin I <0.03 <0.03 ng/mL  CBC with Differential  Result Value Ref Range   WBC 20.1 (H) 4.0 - 10.5 K/uL   RBC 4.65 4.22 - 5.81 MIL/uL   Hemoglobin 14.7 13.0 - 17.0 g/dL   HCT 42.6 39.0 - 52.0 %   MCV 91.6 80.0 - 100.0 fL   MCH 31.6 26.0 - 34.0 pg   MCHC 34.5 30.0 - 36.0 g/dL   RDW 12.1 11.5 - 15.5 %   Platelets 328 150 - 400 K/uL   nRBC 0.0 0.0 - 0.2 %   Neutrophils Relative % 87 %   Neutro Abs 17.6 (H) 1.7 - 7.7 K/uL   Lymphocytes Relative 5 %   Lymphs Abs 1.0 0.7 - 4.0 K/uL   Monocytes Relative 6 %   Monocytes Absolute 1.2 (H) 0.1 - 1.0 K/uL   Eosinophils Relative 0 %   Eosinophils Absolute 0.0 0.0 - 0.5 K/uL   Basophils Relative 1 %   Basophils Absolute 0.1 0.0 - 0.1 K/uL   Immature Granulocytes 1 %   Abs Immature Granulocytes 0.12  (H) 0.00 - 0.07 K/uL  Urinalysis, Routine w reflex microscopic  Result Value Ref Range   Color, Urine YELLOW YELLOW   APPearance CLEAR CLEAR   Specific Gravity, Urine 1.015 1.005 - 1.030   pH 5.0 5.0 - 8.0   Glucose, UA >=500 (A) NEGATIVE mg/dL   Hgb urine dipstick NEGATIVE NEGATIVE   Bilirubin Urine NEGATIVE NEGATIVE   Ketones, ur 5 (A) NEGATIVE mg/dL   Protein, ur NEGATIVE NEGATIVE mg/dL   Nitrite NEGATIVE NEGATIVE   Leukocytes,Ua NEGATIVE NEGATIVE   RBC / HPF 0-5 0 - 5 RBC/hpf   WBC, UA 0-5 0 - 5 WBC/hpf   Bacteria, UA RARE (A) NONE SEEN   Squamous Epithelial / LPF 0-5 0 - 5   Dg Chest 1 View  Result Date: 07/08/2018 CLINICAL DATA:  Fall from ladder with chest pain,  initial encounter EXAM: CHEST  1 VIEW COMPARISON:  07/04/2018 FINDINGS: Cardiac shadow is within normal limits. Postsurgical changes. The lungs are well aerated with mild left basilar atelectasis. No focal infiltrate is seen. No acute bony is noted. IMPRESSION: Mild left basilar atelectasis. Electronically Signed   By: Inez Catalina M.D.   On: 07/08/2018 21:02   Dg Chest 2 View  Result Date: 07/04/2018 CLINICAL DATA:  Increasing shortness of breath over the past month. EXAM: CHEST - 2 VIEW COMPARISON:  CT chest dated March 01, 2018. Chest x-ray dated January 29, 2018. FINDINGS: The heart size and mediastinal contours are within normal limits. Prior CABG. Normal pulmonary vascularity. Unchanged elevation of the right hemidiaphragm. Persistent low lung volumes. No focal consolidation, pleural effusion, or pneumothorax. No acute osseous abnormality. Air within the colon hepatic flexure underneath the right hemidiaphragm. IMPRESSION: No active cardiopulmonary disease. Electronically Signed   By: Titus Dubin M.D.   On: 07/04/2018 13:04   Ct Head Wo Contrast  Result Date: 07/20/2018 CLINICAL DATA:  78 year old and recent fall.  Decreased hearing. EXAM: CT HEAD WITHOUT CONTRAST TECHNIQUE: Contiguous axial images were  obtained from the base of the skull through the vertex without intravenous contrast. COMPARISON:  Brain MRI 05/29/2014 FINDINGS: Brain: Intraventricular hemorrhage in the posterior aspect of the lateral ventricles, left side greater than right. Largest amount of blood is probably involving the left choroid plexus and could represent a shear injury. Dilatation of the lateral ventricles has mildly progressed since 2016. In addition, there is concern for a small amount of subarachnoid hemorrhage in the left frontal lobe on sequence 3, image 15. Low density in the periventricular and subcortical white matter is similar to the previous MRI findings. There is no evidence for midline shift, mass lesion or large infarct. Pituitary gland is mildly prominent but similar to the previous MRI findings. Vascular: No hyperdense vessel or unexpected calcification. Skull: Normal. Negative for fracture or focal lesion. Sinuses/Orbits: Mucosal disease in the ethmoid air cells. Probable mucosal disease in the posterior right sphenoid sinus. Other: None IMPRESSION: 1. Intraventricular hemorrhage involving bilateral lateral ventricles, left side greater than right. Findings could be related to a shear injury involving the left choroid plexus. There is also concern for a small amount of subarachnoid blood in the left frontal lobe. 2. Dilatation of the lateral ventricles has mildly progressed since 2016. No evidence for obstructive hydrocephalus. Critical Value/emergent results were called by telephone at the time of interpretation on 07/20/2018 at 6:30 pm to Dr. Davonna Belling , who verbally acknowledged these results. Electronically Signed   By: Markus Daft M.D.   On: 07/20/2018 18:34   Ct Angio Chest Pe W And/or Wo Contrast  Result Date: 07/09/2018 CLINICAL DATA:  Initial evaluation for acute chest pain, abdominal pain. EXAM: CT ANGIOGRAPHY CHEST CT ABDOMEN AND PELVIS WITH CONTRAST TECHNIQUE: Multidetector CT imaging of the chest was  performed using the standard protocol during bolus administration of intravenous contrast. Multiplanar CT image reconstructions and MIPs were obtained to evaluate the vascular anatomy. Multidetector CT imaging of the abdomen and pelvis was performed using the standard protocol during bolus administration of intravenous contrast. CONTRAST:  154mL OMNIPAQUE IOHEXOL 350 MG/ML SOLN COMPARISON:  Prior CT from 03/01/2018 FINDINGS: CTA CHEST FINDINGS Cardiovascular: Intrathoracic aorta of normal caliber without aneurysm or other acute abnormality. Moderate aortic atherosclerosis. Visualized great vessels tortuous but within normal limits. Mild cardiomegaly no pericardial effusion. Pulmonary arterial tree adequately opacified for evaluation. Main pulmonary artery dilated up to 3.4  cm, suggesting underlying pulmonary hypertension. No filling defect to suggest acute pulmonary embolism. Re-formatted imaging confirms these findings. Mediastinum/Nodes: Visualized thyroid within normal limits. No enlarged mediastinal, hilar, or axillary lymph nodes. Esophagus within normal limits. Lungs/Pleura: Tracheobronchial tree intact and patent. Diffuse central airway thickening noted. Lungs hypoinflated bilaterally with elevation of the hemidiaphragms. Trace layering bilateral pleural effusions, left slightly larger than right. Associated bibasilar atelectasis. No other focal infiltrates. No pulmonary edema. No pneumothorax. Few scattered subpleural subcentimeter nodular densities measuring up to 5 mm noted within the peripheral upper lobes bilaterally, indeterminate. Musculoskeletal: Median sternotomy wires noted. No acute osseous abnormality. No worrisome lytic or blastic osseous lesions. Review of the MIP images confirms the above findings. CT ABDOMEN and PELVIS FINDINGS Hepatobiliary: Liver demonstrates a normal contrast enhanced appearance. Punctate granuloma noted within the caudate lobe. Gallbladder surgically absent. No biliary  dilatation. Pancreas: Sequelae of prior distal pancreatectomy noted. Pancreas otherwise unremarkable. Spleen: Spleen is absent. Adrenals/Urinary Tract: Adrenal glands within normal limits. Kidneys equal in size with symmetric enhancement. Focal cortical thinning and scarring noted at the lower pole the right kidney. 6 mm nonobstructive stone present at the lower pole the right kidney. No other radiopaque calculi. No hydronephrosis or hydroureter. No focal enhancing renal mass. Partially distended bladder within normal limits. Stomach/Bowel: Stomach decompressed without acute finding. No evidence for bowel obstruction. Large volume retained stool throughout the colon, suggesting constipation. Negative appendix. No acute inflammatory changes seen about the bowels. Vascular/Lymphatic: Advanced aorto bi-iliac atherosclerotic disease. No aneurysm. Mesenteric vessels patent proximally. Two right-sided renal arteries noted. Retroaortic left renal vein noted as well. No adenopathy. Reproductive: Enlarged prostate measuring 6.3 cm in transverse diameter. Other: No free air or fluid. Musculoskeletal: Additional views of the left hip demonstrate an acute nondisplaced fracture through the superior and posterior aspect of the left acetabulum (series 6, image 25). Subtle medial extension towards the iliopectineal line (series 10, image 42). Anterior column intact. Left femoral head remains normally positioned and remains intact. Femoral neck and visualized proximal femur intact. Remainder of the visualized pelvis intact. No pubic diastasis. SI joints approximated. No other acute osseous abnormality. No discrete lytic or blastic osseous lesions. Patient status post posterior decompression at L2 through L4. Moderate to advanced multilevel degenerative spondylolysis throughout the lumbar spine. Review of the MIP images confirms the above findings. IMPRESSION: CTA CHEST IMPRESSION 1. No CT evidence for acute pulmonary embolism. 2.  Low lung volumes with associated trace layering bilateral pleural effusions and bibasilar atelectasis. No other active cardiopulmonary disease. 3. Central airway thickening with dilatation of the main pulmonary artery up to 3.4 cm, suggesting a degree of underlying pulmonary hypertension. CT ABDOMEN AND PELVIS IMPRESSION 1. Acute nondisplaced left acetabular fracture as above. 2. No other acute abnormality within the abdomen and pelvis. 3. 6 mm nonobstructive right renal nephrolithiasis. 4. Large volume retained stool throughout the colon, suggesting constipation. 5. Advanced atherosclerosis with diffuse 3 vessel coronary artery calcifications. Electronically Signed   By: Jeannine Boga M.D.   On: 07/09/2018 03:35   Ct Abdomen Pelvis W Contrast  Result Date: 07/09/2018 CLINICAL DATA:  Initial evaluation for acute chest pain, abdominal pain. EXAM: CT ANGIOGRAPHY CHEST CT ABDOMEN AND PELVIS WITH CONTRAST TECHNIQUE: Multidetector CT imaging of the chest was performed using the standard protocol during bolus administration of intravenous contrast. Multiplanar CT image reconstructions and MIPs were obtained to evaluate the vascular anatomy. Multidetector CT imaging of the abdomen and pelvis was performed using the standard protocol during bolus administration of intravenous  contrast. CONTRAST:  165mL OMNIPAQUE IOHEXOL 350 MG/ML SOLN COMPARISON:  Prior CT from 03/01/2018 FINDINGS: CTA CHEST FINDINGS Cardiovascular: Intrathoracic aorta of normal caliber without aneurysm or other acute abnormality. Moderate aortic atherosclerosis. Visualized great vessels tortuous but within normal limits. Mild cardiomegaly no pericardial effusion. Pulmonary arterial tree adequately opacified for evaluation. Main pulmonary artery dilated up to 3.4 cm, suggesting underlying pulmonary hypertension. No filling defect to suggest acute pulmonary embolism. Re-formatted imaging confirms these findings. Mediastinum/Nodes: Visualized  thyroid within normal limits. No enlarged mediastinal, hilar, or axillary lymph nodes. Esophagus within normal limits. Lungs/Pleura: Tracheobronchial tree intact and patent. Diffuse central airway thickening noted. Lungs hypoinflated bilaterally with elevation of the hemidiaphragms. Trace layering bilateral pleural effusions, left slightly larger than right. Associated bibasilar atelectasis. No other focal infiltrates. No pulmonary edema. No pneumothorax. Few scattered subpleural subcentimeter nodular densities measuring up to 5 mm noted within the peripheral upper lobes bilaterally, indeterminate. Musculoskeletal: Median sternotomy wires noted. No acute osseous abnormality. No worrisome lytic or blastic osseous lesions. Review of the MIP images confirms the above findings. CT ABDOMEN and PELVIS FINDINGS Hepatobiliary: Liver demonstrates a normal contrast enhanced appearance. Punctate granuloma noted within the caudate lobe. Gallbladder surgically absent. No biliary dilatation. Pancreas: Sequelae of prior distal pancreatectomy noted. Pancreas otherwise unremarkable. Spleen: Spleen is absent. Adrenals/Urinary Tract: Adrenal glands within normal limits. Kidneys equal in size with symmetric enhancement. Focal cortical thinning and scarring noted at the lower pole the right kidney. 6 mm nonobstructive stone present at the lower pole the right kidney. No other radiopaque calculi. No hydronephrosis or hydroureter. No focal enhancing renal mass. Partially distended bladder within normal limits. Stomach/Bowel: Stomach decompressed without acute finding. No evidence for bowel obstruction. Large volume retained stool throughout the colon, suggesting constipation. Negative appendix. No acute inflammatory changes seen about the bowels. Vascular/Lymphatic: Advanced aorto bi-iliac atherosclerotic disease. No aneurysm. Mesenteric vessels patent proximally. Two right-sided renal arteries noted. Retroaortic left renal vein noted as  well. No adenopathy. Reproductive: Enlarged prostate measuring 6.3 cm in transverse diameter. Other: No free air or fluid. Musculoskeletal: Additional views of the left hip demonstrate an acute nondisplaced fracture through the superior and posterior aspect of the left acetabulum (series 6, image 25). Subtle medial extension towards the iliopectineal line (series 10, image 42). Anterior column intact. Left femoral head remains normally positioned and remains intact. Femoral neck and visualized proximal femur intact. Remainder of the visualized pelvis intact. No pubic diastasis. SI joints approximated. No other acute osseous abnormality. No discrete lytic or blastic osseous lesions. Patient status post posterior decompression at L2 through L4. Moderate to advanced multilevel degenerative spondylolysis throughout the lumbar spine. Review of the MIP images confirms the above findings. IMPRESSION: CTA CHEST IMPRESSION 1. No CT evidence for acute pulmonary embolism. 2. Low lung volumes with associated trace layering bilateral pleural effusions and bibasilar atelectasis. No other active cardiopulmonary disease. 3. Central airway thickening with dilatation of the main pulmonary artery up to 3.4 cm, suggesting a degree of underlying pulmonary hypertension. CT ABDOMEN AND PELVIS IMPRESSION 1. Acute nondisplaced left acetabular fracture as above. 2. No other acute abnormality within the abdomen and pelvis. 3. 6 mm nonobstructive right renal nephrolithiasis. 4. Large volume retained stool throughout the colon, suggesting constipation. 5. Advanced atherosclerosis with diffuse 3 vessel coronary artery calcifications. Electronically Signed   By: Jeannine Boga M.D.   On: 07/09/2018 03:35   Dg Chest Portable 1 View  Result Date: 07/20/2018 CLINICAL DATA:  Chest pain. EXAM: PORTABLE CHEST 1 VIEW COMPARISON:  CT chest dated Jul 09, 2018. Chest x-ray dated Jul 08, 2018. FINDINGS: The heart size and mediastinal contours are  within normal limits. Prior CABG. Atherosclerotic calcification of the aortic arch. Normal pulmonary vascularity. Persistent low lung volumes with mild bibasilar atelectasis. No focal consolidation, pleural effusion, or pneumothorax. No acute osseous abnormality. IMPRESSION: 1. Persistent low lung volumes with mild bibasilar atelectasis. Electronically Signed   By: Titus Dubin M.D.   On: 07/20/2018 18:02   Ct No Charge  Result Date: 07/09/2018 CLINICAL DATA:  Initial evaluation for acute chest pain, abdominal pain. EXAM: CT ANGIOGRAPHY CHEST CT ABDOMEN AND PELVIS WITH CONTRAST TECHNIQUE: Multidetector CT imaging of the chest was performed using the standard protocol during bolus administration of intravenous contrast. Multiplanar CT image reconstructions and MIPs were obtained to evaluate the vascular anatomy. Multidetector CT imaging of the abdomen and pelvis was performed using the standard protocol during bolus administration of intravenous contrast. CONTRAST:  128mL OMNIPAQUE IOHEXOL 350 MG/ML SOLN COMPARISON:  Prior CT from 03/01/2018 FINDINGS: CTA CHEST FINDINGS Cardiovascular: Intrathoracic aorta of normal caliber without aneurysm or other acute abnormality. Moderate aortic atherosclerosis. Visualized great vessels tortuous but within normal limits. Mild cardiomegaly no pericardial effusion. Pulmonary arterial tree adequately opacified for evaluation. Main pulmonary artery dilated up to 3.4 cm, suggesting underlying pulmonary hypertension. No filling defect to suggest acute pulmonary embolism. Re-formatted imaging confirms these findings. Mediastinum/Nodes: Visualized thyroid within normal limits. No enlarged mediastinal, hilar, or axillary lymph nodes. Esophagus within normal limits. Lungs/Pleura: Tracheobronchial tree intact and patent. Diffuse central airway thickening noted. Lungs hypoinflated bilaterally with elevation of the hemidiaphragms. Trace layering bilateral pleural effusions, left  slightly larger than right. Associated bibasilar atelectasis. No other focal infiltrates. No pulmonary edema. No pneumothorax. Few scattered subpleural subcentimeter nodular densities measuring up to 5 mm noted within the peripheral upper lobes bilaterally, indeterminate. Musculoskeletal: Median sternotomy wires noted. No acute osseous abnormality. No worrisome lytic or blastic osseous lesions. Review of the MIP images confirms the above findings. CT ABDOMEN and PELVIS FINDINGS Hepatobiliary: Liver demonstrates a normal contrast enhanced appearance. Punctate granuloma noted within the caudate lobe. Gallbladder surgically absent. No biliary dilatation. Pancreas: Sequelae of prior distal pancreatectomy noted. Pancreas otherwise unremarkable. Spleen: Spleen is absent. Adrenals/Urinary Tract: Adrenal glands within normal limits. Kidneys equal in size with symmetric enhancement. Focal cortical thinning and scarring noted at the lower pole the right kidney. 6 mm nonobstructive stone present at the lower pole the right kidney. No other radiopaque calculi. No hydronephrosis or hydroureter. No focal enhancing renal mass. Partially distended bladder within normal limits. Stomach/Bowel: Stomach decompressed without acute finding. No evidence for bowel obstruction. Large volume retained stool throughout the colon, suggesting constipation. Negative appendix. No acute inflammatory changes seen about the bowels. Vascular/Lymphatic: Advanced aorto bi-iliac atherosclerotic disease. No aneurysm. Mesenteric vessels patent proximally. Two right-sided renal arteries noted. Retroaortic left renal vein noted as well. No adenopathy. Reproductive: Enlarged prostate measuring 6.3 cm in transverse diameter. Other: No free air or fluid. Musculoskeletal: Additional views of the left hip demonstrate an acute nondisplaced fracture through the superior and posterior aspect of the left acetabulum (series 6, image 25). Subtle medial extension  towards the iliopectineal line (series 10, image 42). Anterior column intact. Left femoral head remains normally positioned and remains intact. Femoral neck and visualized proximal femur intact. Remainder of the visualized pelvis intact. No pubic diastasis. SI joints approximated. No other acute osseous abnormality. No discrete lytic or blastic osseous lesions. Patient status post posterior decompression at L2 through  L4. Moderate to advanced multilevel degenerative spondylolysis throughout the lumbar spine. Review of the MIP images confirms the above findings. IMPRESSION: CTA CHEST IMPRESSION 1. No CT evidence for acute pulmonary embolism. 2. Low lung volumes with associated trace layering bilateral pleural effusions and bibasilar atelectasis. No other active cardiopulmonary disease. 3. Central airway thickening with dilatation of the main pulmonary artery up to 3.4 cm, suggesting a degree of underlying pulmonary hypertension. CT ABDOMEN AND PELVIS IMPRESSION 1. Acute nondisplaced left acetabular fracture as above. 2. No other acute abnormality within the abdomen and pelvis. 3. 6 mm nonobstructive right renal nephrolithiasis. 4. Large volume retained stool throughout the colon, suggesting constipation. 5. Advanced atherosclerosis with diffuse 3 vessel coronary artery calcifications. Electronically Signed   By: Jeannine Boga M.D.   On: 07/09/2018 03:35   Dg Hip Unilat W Or Wo Pelvis 2-3 Views Left  Result Date: 07/08/2018 CLINICAL DATA:  Recent fall from ladder with left hip pain, initial encounter EXAM: DG HIP (WITH OR WITHOUT PELVIS) 3V LEFT COMPARISON:  05/19/2014 FINDINGS: Pelvic ring is intact. Degenerative changes of lumbar spine and hip joints are noted. No acute fractures noted. IMPRESSION: No fracture identified. Electronically Signed   By: Inez Catalina M.D.   On: 07/08/2018 21:03      Domenic Moras, PA-C 07/20/18 2005    Domenic Moras, PA-C 07/20/18 2052    Davonna Belling,  MD 07/30/18 1500

## 2018-07-20 NOTE — ED Notes (Signed)
ED TO INPATIENT HANDOFF REPORT  ED Nurse Name and Phone #: [phone] Doreene Adas Name/Age/Gender Ricardo Le 78 y.o. male Room/Bed: 047C/047C  Code Status   Code Status: Full Code  Home/SNF/Other Home Patient oriented to: self, place, time and situation Is this baseline? Yes   Triage Complete: Triage complete  Chief Complaint CP  Triage Note Pt ambulating with assistance to bathroom and had midsternal chest pain, nausea and 2 episodes of vomiting. Pain free after ASA and 1 nitro. Pt pale and having weakness on EMS arrival. 4mg  zofran given. EKG unremarkable NSR 80. NS 658ml given. Bilateral 20 g AC. Generalized weakess x 2 weeks since pelvic fracture.    Allergies Allergies  Allergen Reactions  . Oxycodone Hcl Nausea And Vomiting  . Rosuvastatin Calcium Other (See Comments)    Myalgias  . Tramadol Other (See Comments)    Makes patient "spaced out and dizzy"  . Iohexol Hives    Patient is ok with premedication (Benadryl 50 mg by mouth); Onset Date: 12/03/2007   . Statins Other (See Comments)    Myalgias- can tolerate Atorvastatin     Level of Care/Admitting Diagnosis ED Disposition    ED Disposition Condition Comment   Admit  Hospital Area: [facility] [[id number]]  Level of Care: Med-Surg [16]  I expect the patient will be discharged within 24 hours: No (not a candidate for 5C-Observation unit)  Covid Evaluation: Confirmed COVID Negative  Diagnosis: Intraventricular hemorrhage ([place]) [132440]  Admitting Physician: Neena Rhymes [[id number]]  Attending Physician: Adella Hare E [[id number]]  PT Class (Do Not Modify): Observation [104]  PT Acc Code (Do Not Modify): Observation [10022]       B Medical/Surgery History Past Medical History:  Diagnosis Date  . CAD (coronary artery disease)    a. 04/2007 s/p CABG x 4  - LIMA->LAD, VG->OM1->Om2, VG->PDA;  b. 12/2008 & 06/2010 Caths - Native multivessel dzs with 4/4 patent grafts. c. cath 03/09/2015 patent  grafts, unchanged from prior cath.  . Cancer [facility])    pancreatic  March 2011  . Carotid bruit    a. 07/2010 U/S- 0-39% bilat ICA stenosis  . Chest pain    Noncardiac probably related to reflux  . DDD (degenerative disc disease)    several surgeries  . Fracture acetabulum-closed ([place]) 07/08/2018  . GERD (gastroesophageal reflux disease)   . HNP (herniated nucleus pulposus), lumbar    a. L2-3, s/p laminotomy, microdiskectomy 02/2009  . HOH (hard of hearing)    uses amplifiers   . Nephrolithiasis   . PAD (peripheral artery disease) ([place]) 04/2014   s/p L Common Iliac Stent  . Palpitations   . 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 Pneumonia    hx of  pna  . Pure hypercholesterolemia    statin intolerance  . Type II or unspecified type diabetes mellitus without mention of complication, not stated as uncontrolled    insulin dependent  . Unspecified essential hypertension    Past Surgical History:  Procedure Laterality Date  . ABDOMINAL AORTAGRAM N/A 05/13/2014   Procedure: ABDOMINAL [name];  Surgeon: Wellington Hampshire, MD;  Location: [place] CATH LAB;  Service: Cardiovascular;  Laterality: N/A;  . ANTERIOR CERVICAL DECOMP/DISCECTOMY FUSION N/A 07/28/2014   Procedure: ANTERIOR CERVICAL DECOMPRESSION/DISCECTOMY FUSION CERVICAL FOUR-FIVE CERVICAL FIVE-SIX ;  Surgeon: Earnie Larsson, MD;  Location: [facility] NEURO ORS;  Service: Neurosurgery;  Laterality: N/A;  . APPENDECTOMY    .  arthroscopy right knee    . BACK SURGERY    . CARDIAC CATHETERIZATION N/A 03/09/2015   Procedure: Left Heart Cath and Cors/Grafts Angiography;  Surgeon: [name] Sine, MD;  Location: [place] CV LAB;  Service: Cardiovascular;  Laterality: N/A;  . CHOLECYSTECTOMY    . CORONARY ARTERY BYPASS GRAFT     X 4 2009 (LIMA to LAD, SVG to first cicumflex marginal branch with sequential SVG to second cicumflex marginal branch  and saphenous vein to posterior descending coronary artery with  endoscopic,vein harvest rt. lower exttremity by Dr.Owen on March 31,2009  . CYST REMOVED     FROM SPINE  . CYSTOURETHROSCOPY     right retrograde pyelogram,manipulate stone in the renal pelvis, rt. double-j catheter.  [name] Kitchen EYE SURGERY    . KNEE SURGERY     rt knee  . LEFT HEART CATH AND CORS/GRAFTS ANGIOGRAPHY N/A 02/20/2017   Procedure: LEFT HEART CATH AND CORS/GRAFTS ANGIOGRAPHY;  Surgeon: Leonie Man, MD;  Location: [facility] CV LAB;  Service: Cardiovascular;  Laterality: N/A;  . LEFT HEART CATH AND CORS/GRAFTS ANGIOGRAPHY N/A 05/28/2018   Procedure: LEFT HEART CATH AND CORS/GRAFTS ANGIOGRAPHY;  Surgeon: Burnell Blanks, MD;  Location: [place] CV LAB;  Service: Cardiovascular;  Laterality: N/A;  . NEPHROLITHOTOMY     PERCUTANEOUS  . PARS PLANA VITRECTOMY     lt. eye,retinal photocoagulation lt. eye, membrane peel lt. eye.  [name] Kitchen PERCUTANEOUS STENT INTERVENTION Left 05/13/2014   Procedure: PERCUTANEOUS STENT INTERVENTION;  Surgeon: Wellington Hampshire, MD;  Location: [facility] CATH LAB;  Service: Cardiovascular;  Laterality: Left;  COMMON ILLIAC  . re-exploratin of laminectomy  05/2008   (RT L2-3) WITH REDO MICRODISKECTOMY     A IV Location/Drains/Wounds Patient Lines/Drains/Airways Status   Active Line/Drains/Airways    Name:   Placement date:   Placement time:   Site:   Days:   Peripheral IV 07/08/18 Right Wrist   07/08/18    1946    Wrist   12   Peripheral IV 07/08/18 Right Antecubital   07/08/18    2310    Antecubital   12   External Urinary Catheter   07/09/18    0534    -   11   Incision (Closed) 07/28/14 Neck Other (Comment)   07/28/14    0940     1453          Intake/Output Last 24 hours No intake or output data in the 24 hours ending 07/20/18 2150  Labs/Imaging Results for orders placed or performed during the hospital encounter of 07/20/18 (from the past 48 hour(s))  Comprehensive metabolic panel     Status: Abnormal   Collection Time: 07/20/18  6:41 PM  Result  Value Ref Range   Sodium 130 (L) 135 - 145 mmol/L   Potassium 3.7 3.5 - 5.1 mmol/L   Chloride 91 (L) 98 - 111 mmol/L   CO2 24 22 - 32 mmol/L   Glucose, Bld 330 (H) 70 - 99 mg/dL   BUN 20 8 - 23 mg/dL   Creatinine, Ser 1.01 0.61 - 1.24 mg/dL   Calcium 9.1 8.9 - 10.3 mg/dL   Total Protein 7.4 6.5 - 8.1 g/dL   Albumin 3.3 (L) 3.5 - 5.0 g/dL   AST 46 (H) 15 - 41 U/L   ALT 71 (H) 0 - 44 U/L   Alkaline Phosphatase 129 (H) 38 - 126 U/L   Total Bilirubin 1.1 0.3 - 1.2 mg/dL   GFR calc  non Af Amer >60 >60 mL/min   GFR calc Af Amer >60 >60 mL/min   Anion gap 15 5 - 15    Comment: Performed at [facility] [address] [place], [place] [address]  Troponin I - Once     Status: None   Collection Time: 07/20/18  6:41 PM  Result Value Ref Range   Troponin I <0.03 <0.03 ng/mL    Comment: Performed at [facility] [address], [place], [place] [address]  CBC with Differential     Status: Abnormal   Collection Time: 07/20/18  6:41 PM  Result Value Ref Range   WBC 20.1 (H) 4.0 - 10.5 K/uL   RBC 4.65 4.22 - 5.81 MIL/uL   Hemoglobin 14.7 13.0 - 17.0 g/dL   HCT 42.6 39.0 - 52.0 %   MCV 91.6 80.0 - 100.0 fL   MCH 31.6 26.0 - 34.0 pg   MCHC 34.5 30.0 - 36.0 g/dL   RDW 12.1 11.5 - 15.5 %   Platelets 328 150 - 400 K/uL   nRBC 0.0 0.0 - 0.2 %   Neutrophils Relative % 87 %   Neutro Abs 17.6 (H) 1.7 - 7.7 K/uL   Lymphocytes Relative 5 %   Lymphs Abs 1.0 0.7 - 4.0 K/uL   Monocytes Relative 6 %   Monocytes Absolute 1.2 (H) 0.1 - 1.0 K/uL   Eosinophils Relative 0 %   Eosinophils Absolute 0.0 0.0 - 0.5 K/uL   Basophils Relative 1 %   Basophils Absolute 0.1 0.0 - 0.1 K/uL   Immature Granulocytes 1 %   Abs Immature Granulocytes 0.12 (H) 0.00 - 0.07 K/uL    Comment: Performed at [facility] [address] [place], [place] [address]  Urinalysis, Routine w reflex microscopic     Status: Abnormal   Collection Time: 07/20/18  7:34 PM  Result Value Ref Range   Color, Urine  YELLOW YELLOW   APPearance CLEAR CLEAR   Specific Gravity, Urine 1.015 1.005 - 1.030   pH 5.0 5.0 - 8.0   Glucose, UA >=500 (A) NEGATIVE mg/dL   Hgb urine dipstick NEGATIVE NEGATIVE   Bilirubin Urine NEGATIVE NEGATIVE   Ketones, ur 5 (A) NEGATIVE mg/dL   Protein, ur NEGATIVE NEGATIVE mg/dL   Nitrite NEGATIVE NEGATIVE   Leukocytes,Ua NEGATIVE NEGATIVE   RBC / HPF 0-5 0 - 5 RBC/hpf   WBC, UA 0-5 0 - 5 WBC/hpf   Bacteria, UA RARE (A) NONE SEEN   Squamous Epithelial / LPF 0-5 0 - 5    Comment: Performed at [facility], [place] [address], [place], [place] [address]  SARS Coronavirus 2 (CEPHEID - Performed in [facility] hospital lab), Hosp Order     Status: None   Collection Time: 07/20/18  7:34 PM  Result Value Ref Range   SARS Coronavirus 2 NEGATIVE NEGATIVE    Comment: (NOTE) If result is NEGATIVE SARS-CoV-2 target nucleic acids are NOT DETECTED. The SARS-CoV-2 RNA is generally detectable in upper and lower  respiratory specimens during the acute phase of infection. The lowest  concentration of SARS-CoV-2 viral copies this assay can detect is 250  copies / mL. A negative result does not preclude SARS-CoV-2 infection  and should not be used as the sole basis for treatment or other  patient management decisions.  A negative result may occur with  improper specimen collection / handling, submission of specimen other  than nasopharyngeal swab, presence of viral  mutation(s) within the  areas targeted by this assay, and inadequate number of viral copies  (<250 copies / mL). A negative result must be combined with clinical  observations, patient history, and epidemiological information. If result is POSITIVE SARS-CoV-2 target nucleic acids are DETECTED. The SARS-CoV-2 RNA is generally detectable in upper and lower  respiratory specimens dur ing the acute phase of infection.  Positive  results are indicative of active infection with SARS-CoV-2.  Clinical  correlation with patient  history and other diagnostic information is  necessary to determine patient infection status.  Positive results do  not rule out bacterial infection or co-infection with other viruses. If result is PRESUMPTIVE POSTIVE SARS-CoV-2 nucleic acids MAY BE PRESENT.   A presumptive positive result was obtained on the submitted specimen  and confirmed on repeat testing.  While 2019 novel coronavirus  (SARS-CoV-2) nucleic acids may be present in the submitted sample  additional confirmatory testing may be necessary for epidemiological  and / or clinical management purposes  to differentiate between  SARS-CoV-2 and other Sarbecovirus currently known to infect humans.  If clinically indicated additional testing with an alternate test  methodology [phone]) is advised. The SARS-CoV-2 RNA is generally  detectable in upper and lower respiratory sp ecimens during the acute  phase of infection. The expected result is Negative. Fact Sheet for Patients:  StrictlyIdeas.no Fact Sheet for Healthcare Providers: BankingDealers.co.za This test is not yet approved or cleared by the [place] FDA and has been authorized for detection and/or diagnosis of SARS-CoV-2 by FDA under an Emergency Use Authorization (EUA).  This EUA will remain in effect (meaning this test can be used) for the duration of the COVID-19 declaration under Section 564(b)(1) of the Act, 21 U.S.C. section 360bbb-3(b)(1), unless the authorization is terminated or revoked sooner. Performed at [facility], [place] [address] [place], [place] [address]    Ct Head Wo Contrast  Result Date: 07/20/2018 CLINICAL DATA:  78 year old and recent fall.  Decreased hearing. EXAM: CT HEAD WITHOUT CONTRAST TECHNIQUE: Contiguous axial images were obtained from the base of the skull through the vertex without intravenous contrast. COMPARISON:  Brain MRI 05/29/2014 FINDINGS: Brain: Intraventricular hemorrhage  in the posterior aspect of the lateral ventricles, left side greater than right. Largest amount of blood is probably involving the left choroid plexus and could represent a shear injury. Dilatation of the lateral ventricles has mildly progressed since 2016. In addition, there is concern for a small amount of subarachnoid hemorrhage in the left frontal lobe on sequence 3, image 15. Low density in the periventricular and subcortical white matter is similar to the previous MRI findings. There is no evidence for midline shift, mass lesion or large infarct. Pituitary gland is mildly prominent but similar to the previous MRI findings. Vascular: No hyperdense vessel or unexpected calcification. Skull: Normal. Negative for fracture or focal lesion. Sinuses/Orbits: Mucosal disease in the ethmoid air cells. Probable mucosal disease in the posterior right sphenoid sinus. Other: None IMPRESSION: 1. Intraventricular hemorrhage involving bilateral lateral ventricles, left side greater than right. Findings could be related to a shear injury involving the left choroid plexus. There is also concern for a small amount of subarachnoid blood in the left frontal lobe. 2. Dilatation of the lateral ventricles has mildly progressed since 2016. No evidence for obstructive hydrocephalus. Critical Value/emergent results were called by telephone at the time of interpretation on 07/20/2018 at 6:30 pm to Dr. Davonna Belling , who verbally acknowledged these results. Electronically Signed  By: Markus [facility] M.D.   On: 07/20/2018 18:34   Dg Chest Portable 1 View  Result Date: 07/20/2018 CLINICAL DATA:  Chest pain. EXAM: PORTABLE CHEST 1 VIEW COMPARISON:  CT chest dated Jul 09, 2018. Chest x-ray dated Jul 08, 2018. FINDINGS: The heart size and mediastinal contours are within normal limits. Prior CABG. Atherosclerotic calcification of the aortic arch. Normal pulmonary vascularity. Persistent low lung volumes with mild bibasilar atelectasis. No focal  consolidation, pleural effusion, or pneumothorax. No acute osseous abnormality. IMPRESSION: 1. Persistent low lung volumes with mild bibasilar atelectasis. Electronically Signed   By: Titus Dubin M.D.   On: 07/20/2018 18:02    Pending Labs Unresulted Labs (From admission, onward)    Start     Ordered   07/21/18 0500  CBC  Tomorrow morning,   R     07/20/18 2143   07/21/18 2595  Basic metabolic panel  Tomorrow morning,   R     07/20/18 2143   07/20/18 2135  Hemoglobin A1c  Once,   R     07/20/18 2143          Vitals/Pain Today's Vitals   07/20/18 1830 07/20/18 1845 07/20/18 1915 07/20/18 2000  BP:    (!) 147/63  Pulse: 83 82 80 82  Resp: (!) 24 (!) 23 19   Temp:      TempSrc:      SpO2: 99% 100% 100% 99%  PainSc:        Isolation Precautions No active isolations  Medications Medications  sodium chloride flush (NS) 0.9 % injection 3 mL (has no administration in time range)  insulin aspart (novoLOG) injection 0-15 Units (has no administration in time range)  insulin glargine (LANTUS) injection 15 Units (has no administration in time range)    Mobility walks with device High fall risk   Focused Assessments Neuro Assessment Handoff:           Neuro Assessment:   Neuro Checks:      Last Documented NIHSS Modified Score:    If patient is a Neuro Trauma and patient is going to OR before floor call report to [facility] nurse: [phone] or [phone]     R Recommendations: See Admitting Provider Note  Report given to:   Additional Notes:

## 2018-07-20 NOTE — Consult Note (Signed)
Reason for Consult: IVH Referring Physician: [name] is an 78 y.o. male.   HPI:  78 year old male presented to the ED today after generalized weakness, NV and some dizziness over the last several days. He was admitted about 2 weeks ago after he fell off of a 3 foot ladder, broke his acetabulum and hit his head. Since then he has had some headaches and difficulty hearing. Spoke with his wife and daughter over the phone who were better historians than the patient. They stated that he has not been able to ambulate alone for a couple days now and just hasn't "been himself" lately. He denies any NV right now but does endorse headaches.   Past Medical History:  Diagnosis Date  . CAD (coronary artery disease)    a. 04/2007 s/p CABG x 4  - LIMA->LAD, VG->OM1->Om2, VG->PDA;  b. 12/2008 & 06/2010 Caths - Native multivessel dzs with 4/4 patent grafts. c. cath 03/09/2015 patent grafts, unchanged from prior cath.  . Cancer [facility])    pancreatic  March 2011  . Carotid bruit    a. 07/2010 U/S- 0-39% bilat ICA stenosis  . Chest pain    Noncardiac probably related to reflux  . DDD (degenerative disc disease)    several surgeries  . Fracture acetabulum-closed ([place]) 07/08/2018  . GERD (gastroesophageal reflux disease)   . HNP (herniated nucleus pulposus), lumbar    a. L2-3, s/p laminotomy, microdiskectomy 02/2009  . HOH (hard of hearing)    uses amplifiers   . Nephrolithiasis   . PAD (peripheral artery disease) ([place]) 04/2014   s/p L Common Iliac Stent  . Palpitations   . 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 Pneumonia    hx of  pna  . Pure hypercholesterolemia    statin intolerance  . Type II or unspecified type diabetes mellitus without mention of complication, not stated as uncontrolled    insulin dependent  . Unspecified essential hypertension     Past Surgical History:  Procedure Laterality Date  . ABDOMINAL AORTAGRAM N/A 05/13/2014    Procedure: ABDOMINAL [name];  Surgeon: Wellington Hampshire, MD;  Location: [place] CATH LAB;  Service: Cardiovascular;  Laterality: N/A;  . ANTERIOR CERVICAL DECOMP/DISCECTOMY FUSION N/A 07/28/2014   Procedure: ANTERIOR CERVICAL DECOMPRESSION/DISCECTOMY FUSION CERVICAL FOUR-FIVE CERVICAL FIVE-SIX ;  Surgeon: Earnie Larsson, MD;  Location: [place] NEURO ORS;  Service: Neurosurgery;  Laterality: N/A;  . APPENDECTOMY    . arthroscopy right knee    . BACK SURGERY    . CARDIAC CATHETERIZATION N/A 03/09/2015   Procedure: Left Heart Cath and Cors/Grafts Angiography;  Surgeon: [name] Sine, MD;  Location: [place] CV LAB;  Service: Cardiovascular;  Laterality: N/A;  . CHOLECYSTECTOMY    . CORONARY ARTERY BYPASS GRAFT     X 4 2009 (LIMA to LAD, SVG to first cicumflex marginal branch with sequential SVG to second cicumflex marginal branch  and saphenous vein to posterior descending coronary artery with endoscopic,vein harvest rt. lower exttremity by Dr.Owen on March 31,2009  . CYST REMOVED     FROM SPINE  . CYSTOURETHROSCOPY     right retrograde pyelogram,manipulate stone in the renal pelvis, rt. double-j catheter.  [name] Kitchen EYE SURGERY    . KNEE SURGERY     rt knee  . LEFT HEART CATH AND CORS/GRAFTS ANGIOGRAPHY N/A 02/20/2017   Procedure: LEFT HEART CATH AND CORS/GRAFTS ANGIOGRAPHY;  Surgeon: Leonie Man, MD;  Location: [place] CV LAB;  Service: Cardiovascular;  Laterality: N/A;  . LEFT HEART CATH AND CORS/GRAFTS ANGIOGRAPHY N/A 05/28/2018   Procedure: LEFT HEART CATH AND CORS/GRAFTS ANGIOGRAPHY;  Surgeon: Burnell Blanks, MD;  Location: [place] CV LAB;  Service: Cardiovascular;  Laterality: N/A;  . NEPHROLITHOTOMY     PERCUTANEOUS  . PARS PLANA VITRECTOMY     lt. eye,retinal photocoagulation lt. eye, membrane peel lt. eye.  [name] Kitchen PERCUTANEOUS STENT INTERVENTION Left 05/13/2014   Procedure: PERCUTANEOUS STENT INTERVENTION;  Surgeon: Wellington Hampshire, MD;  Location: [place] CATH LAB;  Service:  Cardiovascular;  Laterality: Left;  COMMON ILLIAC  . re-exploratin of laminectomy  05/2008   (RT L2-3) WITH REDO MICRODISKECTOMY    Allergies  Allergen Reactions  . Rosuvastatin Calcium Other (See Comments)    Myalgias  . Iohexol Hives    Patient is ok with premedication (Benadryl 50 mg by mouth), Onset Date: [id number]   . Statins Other (See Comments)    Myalgias     Social History   Tobacco Use  . Smoking status: Never Smoker  . Smokeless tobacco: Never Used  Substance Use Topics  . Alcohol use: No    Family History  Problem Relation Age of Onset  . Heart attack Father        died of MI @ 88  . Diabetes Brother        type 2  . Other Mother        died @ 64  . Coronary artery disease Sister        alive  . Other Other        no fh of colon cancer  . Coronary artery disease Brother        s/p CABG.  Alive @ 86     Review of Systems  Positive ROS: as above  All other systems have been reviewed and were otherwise negative with the exception of those mentioned in the HPI and as above.  Objective: Vital signs in last 24 hours: Temp:  [98.3 F (36.8 C)] 98.3 F (36.8 C) (06/06 1736) Pulse Rate:  [76-83] 80 (06/06 1915) Resp:  [19-26] 19 (06/06 1915) BP: (145)/(64) 145/64 (06/06 1736) SpO2:  [98 %-100 %] 100 % (06/06 1915)  General Appearance: Alert, cooperative, no distress, appears stated age Head: Normocephalic, without obvious abnormality, atraumatic Eyes: PERRL, conjunctiva/corneas clear, EOM's intact, fundi benign, both eyes      Lungs:  respirations unlabored Heart: Regular rate and rhythm Extremities: Extremities normal, atraumatic, no cyanosis or edema Pulses: 2+ and symmetric all extremities Skin: Skin color, texture, turgor normal, no rashes or lesions  NEUROLOGIC:   Mental status: A&O x4, no aphasia, good attention span, Memory and fund of knowledge Motor Exam - grossly normal, normal tone and bulk Sensory Exam - grossly normal Reflexes:  symmetric, no pathologic reflexes, No Hoffman's, No clonus Coordination - grossly normal Gait - not tested Balance - not tested Cranial Nerves: I: smell Not tested  II: visual acuity  OS: na    OD: na  II: visual fields Full to confrontation  II: pupils Equal, round, reactive to light  III,VII: ptosis None  III,IV,VI: extraocular muscles  Full ROM  V: mastication Normal  V: facial light touch sensation  Normal  V,VII: corneal reflex  Present  VII: facial muscle function - upper  Normal  VII: facial muscle function - lower Normal  VIII: hearing Not tested  IX: soft palate elevation  Normal  IX,X: gag  reflex Present  XI: trapezius strength  5/5  XI: sternocleidomastoid strength 5/5  XI: neck flexion strength  5/5  XII: tongue strength  Normal    Data Review Lab Results  Component Value Date   WBC 20.1 (H) 07/20/2018   HGB 14.7 07/20/2018   HCT 42.6 07/20/2018   MCV 91.6 07/20/2018   PLT 328 07/20/2018   Lab Results  Component Value Date   NA 130 (L) 07/20/2018   K 3.7 07/20/2018   CL 91 (L) 07/20/2018   CO2 24 07/20/2018   BUN 20 07/20/2018   CREATININE 1.01 07/20/2018   GLUCOSE 330 (H) 07/20/2018   Lab Results  Component Value Date   INR 1.03 02/20/2017    Radiology: Ct Head Wo Contrast  Result Date: 07/20/2018 CLINICAL DATA:  78 year old and recent fall.  Decreased hearing. EXAM: CT HEAD WITHOUT CONTRAST TECHNIQUE: Contiguous axial images were obtained from the base of the skull through the vertex without intravenous contrast. COMPARISON:  Brain MRI 05/29/2014 FINDINGS: Brain: Intraventricular hemorrhage in the posterior aspect of the lateral ventricles, left side greater than right. Largest amount of blood is probably involving the left choroid plexus and could represent a shear injury. Dilatation of the lateral ventricles has mildly progressed since 2016. In addition, there is concern for a small amount of subarachnoid hemorrhage in the left frontal lobe on  sequence 3, image 15. Low density in the periventricular and subcortical white matter is similar to the previous MRI findings. There is no evidence for midline shift, mass lesion or large infarct. Pituitary gland is mildly prominent but similar to the previous MRI findings. Vascular: No hyperdense vessel or unexpected calcification. Skull: Normal. Negative for fracture or focal lesion. Sinuses/Orbits: Mucosal disease in the ethmoid air cells. Probable mucosal disease in the posterior right sphenoid sinus. Other: None IMPRESSION: 1. Intraventricular hemorrhage involving bilateral lateral ventricles, left side greater than right. Findings could be related to a shear injury involving the left choroid plexus. There is also concern for a small amount of subarachnoid blood in the left frontal lobe. 2. Dilatation of the lateral ventricles has mildly progressed since 2016. No evidence for obstructive hydrocephalus. Critical Value/emergent results were called by telephone at the time of interpretation on 07/20/2018 at 6:30 pm to Dr. Davonna Belling , who verbally acknowledged these results. Electronically Signed   By: Markus Daft M.D.   On: 07/20/2018 18:34   Dg Chest Portable 1 View  Result Date: 07/20/2018 CLINICAL DATA:  Chest pain. EXAM: PORTABLE CHEST 1 VIEW COMPARISON:  CT chest dated Jul 09, 2018. Chest x-ray dated Jul 08, 2018. FINDINGS: The heart size and mediastinal contours are within normal limits. Prior CABG. Atherosclerotic calcification of the aortic arch. Normal pulmonary vascularity. Persistent low lung volumes with mild bibasilar atelectasis. No focal consolidation, pleural effusion, or pneumothorax. No acute osseous abnormality. IMPRESSION: 1. Persistent low lung volumes with mild bibasilar atelectasis. Electronically Signed   By: Titus Dubin M.D.   On: 07/20/2018 18:02    Assessment/Plan: 78 year old male presented to the ED today after decompensating over the last several days according to  family. He fell and hit his head about 2 weeks ago. No reports of a fall since then. He has had multiple near syncopal episodes over the last couple weeks. CT head shows bilateral lateral intraventricular hemorrhages, left greater than right. The blood appears to be acute and not likely from 2 weeks ago. He also has a small amount of sah in the left  frontal lobe. Would appreciate hospitalist to admit and make sure he is stable from a medical standpoint. Will rescan head in the AM.    Ricardo Le 07/20/2018 8:10 PM

## 2018-07-20 NOTE — H&P (Signed)
History and Physical    Ricardo Le CXK:[id number] DOB: 05-10-1940 DOA: 07/20/2018  PCP: Angelina Sheriff, MD  Patient coming from: Home  I have personally briefly reviewed patient's old medical records in [place]  Chief Complaint: Headache, weakness, confusion  HPI: Ricardo Le is a 78 y.o. male with medical history significant of insulin-dependent diabetes, known coronary artery disease with stable angina hypertension, hyperlipidemia.  Patient fell approximately 2 weeks ago sustaining a acetabular fracture.  Was hospitalized from May 25 to May 28.  Pubic intervention was needed.  Home for physical therapy and touchdown weightbearing.  He does not report having an additional fall.  Patient's family noted that he was increasingly confused, complaining of a headache, and generally weak and for the symptoms he was brought to the emergency department for evaluation.  ED Course: ED evaluation revealed the patient to have an intraventricular bleed on CT scan of the brain as well as a possible subarachnoid hemorrhage in the frontal lobes.  Was hemodynamically stable.  Laboratory was unremarkable except for a mild leukocytosis of 20,000 and negative COVID-19.  He was seen in consultation by neurosurgery who felt there was no immediate need for intervention.  The recommendation was for the patient to be brought in for observation to make sure there is not progressive intraventricular bleeding with a follow-up CT scan in the morning.  Review of Systems: As per HPI otherwise 10 point review of systems negative with the following exceptions: Patient complains of a frontal headache, planes of generalized weakness.  Patient has stable chronic chest pain and gets good relief from his medications.  He denies any respiratory difficulty specifically cough or shortness of breath.  Has longstanding chronic GI discomfort but reports that this is been worse over the past week.  Take a proton pump  inhibitor and also was taking Carafate although he is not certain he was taking the latter   Past Medical History:  Diagnosis Date   CAD (coronary artery disease)    a. 04/2007 s/p CABG x 4  - LIMA->LAD, VG->OM1->Om2, VG->PDA;  b. 12/2008 & 06/2010 Caths - Native multivessel dzs with 4/4 patent grafts. c. cath 03/09/2015 patent grafts, unchanged from prior cath.   Cancer [facility])    pancreatic  March 2011   Carotid bruit    a. 07/2010 U/S- 0-39% bilat ICA stenosis   Chest pain    Noncardiac probably related to reflux   DDD (degenerative disc disease)    several surgeries   Fracture acetabulum-closed ([place]) 07/08/2018   GERD (gastroesophageal reflux disease)    HNP (herniated nucleus pulposus), lumbar    a. L2-3, s/p laminotomy, microdiskectomy 02/2009   HOH (hard of hearing)    uses amplifiers    Nephrolithiasis    PAD (peripheral artery disease) ([place]) 04/2014   s/p L Common Iliac Stent   Palpitations    Pancreatic mass    a. 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   Pneumonia    hx of  pna   Pure hypercholesterolemia    statin intolerance   Type II or unspecified type diabetes mellitus without mention of complication, not stated as uncontrolled    insulin dependent   Unspecified essential hypertension     Past Surgical History:  Procedure Laterality Date   ABDOMINAL AORTAGRAM N/A 05/13/2014   Procedure: ABDOMINAL [name];  Surgeon: Wellington Hampshire, MD;  Location: [facility] CATH LAB;  Service: Cardiovascular;  Laterality: N/A;   ANTERIOR CERVICAL  DECOMP/DISCECTOMY FUSION N/A 07/28/2014   Procedure: ANTERIOR CERVICAL DECOMPRESSION/DISCECTOMY FUSION CERVICAL FOUR-FIVE CERVICAL FIVE-SIX ;  Surgeon: Earnie Larsson, MD;  Location: [facility];  Service: Neurosurgery;  Laterality: N/A;   APPENDECTOMY     arthroscopy right knee     BACK SURGERY     CARDIAC CATHETERIZATION N/A 03/09/2015   Procedure: Left Heart Cath and Cors/Grafts Angiography;  Surgeon:  [name] Sine, MD;  Location: [facility] CV LAB;  Service: Cardiovascular;  Laterality: N/A;   CHOLECYSTECTOMY     CORONARY ARTERY BYPASS GRAFT     X 4 2009 (LIMA to LAD, SVG to first cicumflex marginal branch with sequential SVG to second cicumflex marginal branch  and saphenous vein to posterior descending coronary artery with endoscopic,vein harvest rt. lower exttremity by Dr.Owen on March 31,2009   CYST REMOVED     FROM SPINE   CYSTOURETHROSCOPY     right retrograde pyelogram,manipulate stone in the renal pelvis, rt. double-j catheter.   EYE SURGERY     KNEE SURGERY     rt knee   LEFT HEART CATH AND CORS/GRAFTS ANGIOGRAPHY N/A 02/20/2017   Procedure: LEFT HEART CATH AND CORS/GRAFTS ANGIOGRAPHY;  Surgeon: Leonie Man, MD;  Location: [place] CV LAB;  Service: Cardiovascular;  Laterality: N/A;   LEFT HEART CATH AND CORS/GRAFTS ANGIOGRAPHY N/A 05/28/2018   Procedure: LEFT HEART CATH AND CORS/GRAFTS ANGIOGRAPHY;  Surgeon: Burnell Blanks, MD;  Location: [place] CV LAB;  Service: Cardiovascular;  Laterality: N/A;   NEPHROLITHOTOMY     PERCUTANEOUS   PARS PLANA VITRECTOMY     lt. eye,retinal photocoagulation lt. eye, membrane peel lt. eye.   PERCUTANEOUS STENT INTERVENTION Left 05/13/2014   Procedure: PERCUTANEOUS STENT INTERVENTION;  Surgeon: Wellington Hampshire, MD;  Location: [facility] CATH LAB;  Service: Cardiovascular;  Laterality: Left;  COMMON ILLIAC   re-exploratin of laminectomy  05/2008   (RT L2-3) WITH REDO MICRODISKECTOMY     reports that he has never smoked. He has never used smokeless tobacco. He reports that he does not drink alcohol or use drugs.  Allergies  Allergen Reactions   Oxycodone Hcl Nausea And Vomiting   Rosuvastatin Calcium Other (See Comments)    Myalgias   Tramadol Other (See Comments)    Makes patient "spaced out and dizzy"   Iohexol Hives    Patient is ok with premedication (Benadryl 50 mg by mouth); Onset Date: 12/03/2007     Statins Other (See Comments)    Myalgias- can tolerate Atorvastatin     Family History  Problem Relation Age of Onset   Heart attack Father        died of MI @ 71   Diabetes Brother        type 2   Other Mother        died @ 69   Coronary artery disease Sister        alive   Other Other        no fh of colon cancer   Coronary artery disease Brother        s/p CABG.  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  His retirement he has been doing some general work in [employer].  This with his wife and his children live nearby including 4 daughters and 1 son.  He reports he has greater than 20 grandchildren.  Reports his family is all very supportive  Prior to Admission medications   Medication Sig Start Date End Date Taking? Authorizing Provider  acetaminophen (TYLENOL 8 HOUR ARTHRITIS PAIN) 650 MG CR tablet Take 650 mg by mouth every 8 (eight) hours as needed for pain (arthritis).   Yes [provider]  acetaminophen (TYLENOL) 325 MG tablet Take 650 mg by mouth every 6 (six) hours as needed (pain).   Yes [provider]  amLODipine (NORVASC) 10 MG tablet Take 10 mg by mouth daily. 08/16/16  Yes [provider]  aspirin EC 81 MG tablet Take 81 mg by mouth at bedtime.   Yes [provider]  atorvastatin (LIPITOR) 20 MG tablet Take 20 mg by mouth at bedtime.  04/14/18  Yes [provider]  bisacodyl (DULCOLAX) 5 MG EC tablet Take 5 mg by mouth every 3 (three) days.    Yes [provider]  esomeprazole (NEXIUM) 40 MG capsule Take 40 mg by mouth 2 (two) times daily before a meal.  06/18/18  Yes [provider]  gabapentin (NEURONTIN) 100 MG capsule Take 100 mg by mouth 2 (two) times daily as needed (for nerve pain).    Yes [provider]  glipiZIDE (GLUCOTROL) 10 MG tablet Take 10 mg by mouth 2 (two) times daily.    Yes [provider]    insulin glargine (LANTUS SOLOSTAR) 100 UNIT/ML injection Inject 15 Units into the skin at bedtime.    Yes [provider]  lisinopril-hydrochlorothiazide (PRINZIDE,ZESTORETIC) 20-12.5 MG per tablet Take 1 tablet by mouth daily.     Yes [provider]  nitroGLYCERIN (NITROSTAT) 0.4 MG SL tablet Place 1 tablet (0.4 mg total) under the tongue every 5 (five) minutes as needed for chest pain. 05/02/18 05/01/19 Yes Josue Hector, MD  polyethylene glycol (MIRALAX / GLYCOLAX) 17 g packet Take 17 g by mouth daily as needed for severe constipation. Patient taking differently: Take 17 g by mouth daily as needed (for constipation).  07/11/18  Yes Kyle, Tyrone A, DO  sucralfate (CARAFATE) 1 GM/10ML suspension Take 1 g by mouth 4 (four) times daily.   Yes [provider]  tamsulosin (FLOMAX) 0.4 MG CAPS capsule Take 0.4 mg by mouth daily after supper.   Yes [provider]  docusate sodium (COLACE) 100 MG capsule Take 1 capsule (100 mg total) by mouth 2 (two) times daily. Patient not taking: Reported on 07/20/2018 07/11/18   Cherylann Ratel A, DO  Multiple Vitamin (MULTIVITAMIN WITH MINERALS) TABS tablet Take 1 tablet by mouth daily.    [provider]  ranolazine (RANEXA) 500 MG 12 hr tablet Take 1 tablet (500 mg total) by mouth 2 (two) times daily. Patient not taking: Reported on 07/20/2018 06/10/18   Burtis Junes, NP    Physical Exam: Vitals:   07/20/18 1830 07/20/18 1845 07/20/18 1915 07/20/18 2000  BP:    (!) 147/63  Pulse: 83 82 80 82  Resp: (!) 24 (!) 23 19   Temp:      TempSrc:      SpO2: 99% 100% 100% 99%    Constitutional: NAD, calm, comfortable Vitals:   07/20/18 1830 07/20/18 1845 07/20/18 1915 07/20/18 2000  BP:    (!) 147/63  Pulse: 83 82 80 82  Resp: (!) 24 (!) 23 19   Temp:      TempSrc:      SpO2: 99% 100% 100% 99%   General: Well-nourished well-developed slender man in no acute distress  eyes: PERRL, lids and conjunctivae normal  ENMT:  Mucous membranes are moist. Posterior pharynx clear of any exudate or lesions.Normal dentition.  Neck: normal, supple, no masses, no thyromegaly Respiratory: clear to auscultation bilaterally, no wheezing, no crackles. Normal respiratory effort. No accessory muscle use.  Cardiovascular: Regular rate and rhythm, no murmurs / rubs / gallops. No extremity edema. 2+ pedal pulses. carotid bruits noted on the right, clear on the left.  Abdomen: no tenderness, no masses palpated. No hepatosplenomegaly. Bowel sounds positive.  Musculoskeletal: no clubbing / cyanosis. No joint deformity upper and lower extremities. Good ROM, no contractures. Normal muscle tone.  Skin: no rashes, lesions, ulcers. No induration Neurologic: CN 2-12 grossly intact. Sensation intact, DTR normal. Strength 5/5 in all 4. No focal findings noted Psychiatric: Normal judgment and insight. Alert but reports feeling confused he is oriented x 3. Normal mood.     Labs on Admission: I have personally reviewed following labs and imaging studies  CBC: Recent Labs  Lab 07/20/18 1841  WBC 20.1*  NEUTROABS 17.6*  HGB 14.7  HCT 42.6  MCV 91.6  PLT 578   Basic Metabolic Panel: Recent Labs  Lab 07/20/18 1841  NA 130*  K 3.7  CL 91*  CO2 24  GLUCOSE 330*  BUN 20  CREATININE 1.01  CALCIUM 9.1   GFR: Estimated Creatinine Clearance: 54.1 mL/min (by C-G formula based on SCr of 1.01 mg/dL). Liver Function Tests: Recent Labs  Lab 07/20/18 1841  AST 46*  ALT 71*  ALKPHOS 129*  BILITOT 1.1  PROT 7.4  ALBUMIN 3.3*   No results for input(s): LIPASE, AMYLASE in the last 168 hours. No results for input(s): AMMONIA in the last 168 hours. Coagulation Profile: No results for input(s): INR, PROTIME in the last 168 hours. Cardiac Enzymes: Recent Labs  Lab 07/20/18 1841  TROPONINI <0.03   BNP (last 3 results) Recent Labs    07/04/18 1311  PROBNP 137   HbA1C: No results for input(s): HGBA1C in the last 72  hours. CBG: No results for input(s): GLUCAP in the last 168 hours. Lipid Profile: No results for input(s): CHOL, HDL, LDLCALC, TRIG, CHOLHDL, LDLDIRECT in the last 72 hours. Thyroid Function Tests: No results for input(s): TSH, T4TOTAL, FREET4, T3FREE, THYROIDAB in the last 72 hours. Anemia Panel: No results for input(s): VITAMINB12, FOLATE, FERRITIN, TIBC, IRON, RETICCTPCT in the last 72 hours. Urine analysis:    Component Value Date/Time   COLORURINE YELLOW 07/20/2018 1934   APPEARANCEUR CLEAR 07/20/2018 1934   LABSPEC 1.015 07/20/2018 1934   PHURINE 5.0 07/20/2018 1934   GLUCOSEU >=500 (A) 07/20/2018 1934   GLUCOSEU NEGATIVE 05/27/2009 1411   HGBUR NEGATIVE 07/20/2018 1934   BILIRUBINUR NEGATIVE 07/20/2018 1934   KETONESUR 5 (A) 07/20/2018 1934   PROTEINUR NEGATIVE 07/20/2018 1934   UROBILINOGEN 0.2 05/18/2014 1130   NITRITE NEGATIVE 07/20/2018 1934   LEUKOCYTESUR NEGATIVE 07/20/2018 1934    Radiological Exams on Admission: Ct Head Wo Contrast  Result Date: 07/20/2018 CLINICAL DATA:  78 year old and recent fall.  Decreased hearing. EXAM: CT HEAD WITHOUT CONTRAST TECHNIQUE: Contiguous axial images were obtained from the base of the skull through the vertex without intravenous contrast. COMPARISON:  Brain MRI 05/29/2014 FINDINGS: Brain: Intraventricular hemorrhage in the posterior aspect of the lateral ventricles, left side greater than right. Largest amount of blood is probably involving the left choroid plexus and could represent a shear injury. Dilatation of the lateral ventricles has mildly progressed since 2016. In addition, there is concern for a small amount of subarachnoid hemorrhage in  the left frontal lobe on sequence 3, image 15. Low density in the periventricular and subcortical white matter is similar to the previous MRI findings. There is no evidence for midline shift, mass lesion or large infarct. Pituitary gland is mildly prominent but similar to the previous MRI  findings. Vascular: No hyperdense vessel or unexpected calcification. Skull: Normal. Negative for fracture or focal lesion. Sinuses/Orbits: Mucosal disease in the ethmoid air cells. Probable mucosal disease in the posterior right sphenoid sinus. Other: None IMPRESSION: 1. Intraventricular hemorrhage involving bilateral lateral ventricles, left side greater than right. Findings could be related to a shear injury involving the left choroid plexus. There is also concern for a small amount of subarachnoid blood in the left frontal lobe. 2. Dilatation of the lateral ventricles has mildly progressed since 2016. No evidence for obstructive hydrocephalus. Critical Value/emergent results were called by telephone at the time of interpretation on 07/20/2018 at 6:30 pm to Dr. Davonna Belling , who verbally acknowledged these results. Electronically Signed   By: Markus Daft M.D.   On: 07/20/2018 18:34   Dg Chest Portable 1 View  Result Date: 07/20/2018 CLINICAL DATA:  Chest pain. EXAM: PORTABLE CHEST 1 VIEW COMPARISON:  CT chest dated Jul 09, 2018. Chest x-ray dated Jul 08, 2018. FINDINGS: The heart size and mediastinal contours are within normal limits. Prior CABG. Atherosclerotic calcification of the aortic arch. Normal pulmonary vascularity. Persistent low lung volumes with mild bibasilar atelectasis. No focal consolidation, pleural effusion, or pneumothorax. No acute osseous abnormality. IMPRESSION: 1. Persistent low lung volumes with mild bibasilar atelectasis. Electronically Signed   By: Titus Dubin M.D.   On: 07/20/2018 18:02    EKG: Independently reviewed.  EKG with signs of old injury but no acute changes.  Normal sinus rhythm is noted  Assessment/Plan Active Problems:   Intraventricular hemorrhage (HCC)   Type 2 diabetes mellitus with vascular disease (HCC)   Elevated lipids   Essential hypertension   Chest pain  1.  Intraventricular hemorrhage-patient with either a posttraumatic intraventricular  hemorrhage from a fall 1 to 2 weeks ago versus spontaneous hemorrhage.  Prior work-up for aneurysm or other intracranial abnormality.  Also a small frontal subdural hematoma also possibly related to trauma.  Patient symptoms are minimal.  Neurosurgery is on board Plan - observation admission  Follow-up CT scan of the brain in the a.m.  Will hold aspirin at this time  2.  Cardiovascular -patient with known CAD and possible of obstruction of distal grafts.  He is followed closely by the cardiology service.  This time he is being treated medically.  His symptoms seem to be well controlled.  No evidence of acute cardiovascular problems at this time. Plan continue the patient's home medications  3.  Diabetes -patient is on glipizide and basal insulin therapy at home.  Last hemoglobin A1c in April was 7.3%.  Repeat A1c is pending Plan  will continue Lantus 50 units nightly.  Glipizide will be held  Sliding scale insulin will be initiated with no at bedtime coverage  Nutritional support in light of weakness and reported weight loss.   4.  Hypertension   -mild elevation at time of admission with a systolic blood pressure of 147. Plan continue home medications  5. Acetabular fracture - per wife home PT is not working well and she has a hard time managing him. She suggests that she may want him to have skilled rehab.  DVT prophylaxis: SCDs will be used.  Goal anticoagulation contraindicated in presence  of intraventricular bleed  Code Status: Patient is a full code  Family Communication: spoke with wife Disposition Plan: home in 24 hrs  Consults called: neurosurgery  Admission status: obs   Adella Hare MD [facility] Pager [phone]  If 7PM-7AM, please contact night-coverage [email or web address] Password [facility]  07/20/2018, 9:48 PM

## 2018-07-20 NOTE — ED Notes (Signed)
Ricardo Le wife updated

## 2018-07-20 NOTE — ED Provider Notes (Signed)
[facility] EMERGENCY DEPARTMENT Provider Note   CSN: [id number] Arrival date & time: 07/20/18  1708    History   Chief Complaint Chief Complaint  Patient presents with  . Chest Pain    HPI Ricardo Le is a 78 y.o. male.     HPI Patient presents after episode of chest pain and weakness.  Reportedly was walking the bathroom with some assistance and became pale.  Sat down.  Vomited.  Patient is a little unsure of what happened.  Had recent pelvic fracture.  States that he has been sort of weak since then.  No fevers or chills.  No cough.  No swelling in his legs.  States he has had difficulty hearing since a fall with acetabular fracture around a week ago.  Denies abdominal pain.  States he has to urinate.  States that he has had difficulty hearing and difficulty remembering since the fall. Past Medical History:  Diagnosis Date  . CAD (coronary artery disease)    a. 04/2007 s/p CABG x 4  - LIMA->LAD, VG->OM1->Om2, VG->PDA;  b. 12/2008 & 06/2010 Caths - Native multivessel dzs with 4/4 patent grafts. c. cath 03/09/2015 patent grafts, unchanged from prior cath.  . Cancer [facility])    pancreatic  March 2011  . Carotid bruit    a. 07/2010 U/S- 0-39% bilat ICA stenosis  . Chest pain    Noncardiac probably related to reflux  . DDD (degenerative disc disease)    several surgeries  . Fracture acetabulum-closed ([place]) 07/08/2018  . GERD (gastroesophageal reflux disease)   . HNP (herniated nucleus pulposus), lumbar    a. L2-3, s/p laminotomy, microdiskectomy 02/2009  . HOH (hard of hearing)    uses amplifiers   . Nephrolithiasis   . PAD (peripheral artery disease) ([place]) 04/2014   s/p L Common Iliac Stent  . Palpitations   . 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 Pneumonia    hx of  pna  . Pure hypercholesterolemia    statin intolerance  . Type II or unspecified type diabetes mellitus without mention of complication, not  stated as uncontrolled    insulin dependent  . Unspecified essential hypertension     Patient Active Problem List   Diagnosis Date Noted  . Fracture of left acetabulum ([place]) 07/09/2018  . Elevated troponin   . Coronary artery disease involving native coronary artery of native heart with unstable angina pectoris ([place])   . Chest pain 02/20/2017  . Precordial chest pain   . Angina pectoris ([place]) 02/19/2017  . Atherosclerotic heart disease of native coronary artery with unstable angina pectoris ([place]) 03/08/2015  . Hyperlipidemia 01/26/2015  . Cervical spondylosis without myelopathy 07/28/2014  . Cervical spondylosis 07/28/2014  . Constipation 05/19/2014  . Pain 05/18/2014  . PAD (peripheral artery disease) ([place]) 03/20/2012  . Unstable angina ([place]) 05/05/2011  . DDD (degenerative disc disease)   . HNP (herniated nucleus pulposus), lumbar   . Pancreatic mass   . Nephrolithiasis   . Carotid bruit   . S/P CABG x 4 03/08/2011  . Bruit 07/06/2010  . NONSPECIFIC ABN FINDING RAD & OTH EXAM GI TRACT 06/09/2009  . NAUSEA ALONE 05/28/2009  . CHANGE IN BOWELS 05/28/2009  . Elevated lipids 05/27/2009  . Abdominal pain, generalized 05/27/2009  . Type 2 diabetes mellitus with vascular disease ([place]) 12/16/2008  . Essential hypertension 12/16/2008  . BACK PAIN 12/16/2008  . Palpitations 12/16/2008  Past Surgical History:  Procedure Laterality Date  . ABDOMINAL AORTAGRAM N/A 05/13/2014   Procedure: ABDOMINAL [name];  Surgeon: Wellington Hampshire, MD;  Location: [facility] CATH LAB;  Service: Cardiovascular;  Laterality: N/A;  . ANTERIOR CERVICAL DECOMP/DISCECTOMY FUSION N/A 07/28/2014   Procedure: ANTERIOR CERVICAL DECOMPRESSION/DISCECTOMY FUSION CERVICAL FOUR-FIVE CERVICAL FIVE-SIX ;  Surgeon: Earnie Larsson, MD;  Location: [facility] NEURO ORS;  Service: Neurosurgery;  Laterality: N/A;  . APPENDECTOMY    . arthroscopy right knee    . BACK SURGERY    . CARDIAC CATHETERIZATION N/A 03/09/2015   Procedure: Left  Heart Cath and Cors/Grafts Angiography;  Surgeon: [name] Sine, MD;  Location: [facility] CV LAB;  Service: Cardiovascular;  Laterality: N/A;  . CHOLECYSTECTOMY    . CORONARY ARTERY BYPASS GRAFT     X 4 2009 (LIMA to LAD, SVG to first cicumflex marginal branch with sequential SVG to second cicumflex marginal branch  and saphenous vein to posterior descending coronary artery with endoscopic,vein harvest rt. lower exttremity by Dr.Owen on March 31,2009  . CYST REMOVED     FROM SPINE  . CYSTOURETHROSCOPY     right retrograde pyelogram,manipulate stone in the renal pelvis, rt. double-j catheter.  [name] Kitchen EYE SURGERY    . KNEE SURGERY     rt knee  . LEFT HEART CATH AND CORS/GRAFTS ANGIOGRAPHY N/A 02/20/2017   Procedure: LEFT HEART CATH AND CORS/GRAFTS ANGIOGRAPHY;  Surgeon: Leonie Man, MD;  Location: [facility] CV LAB;  Service: Cardiovascular;  Laterality: N/A;  . LEFT HEART CATH AND CORS/GRAFTS ANGIOGRAPHY N/A 05/28/2018   Procedure: LEFT HEART CATH AND CORS/GRAFTS ANGIOGRAPHY;  Surgeon: Burnell Blanks, MD;  Location: [facility] CV LAB;  Service: Cardiovascular;  Laterality: N/A;  . NEPHROLITHOTOMY     PERCUTANEOUS  . PARS PLANA VITRECTOMY     lt. eye,retinal photocoagulation lt. eye, membrane peel lt. eye.  [name] Kitchen PERCUTANEOUS STENT INTERVENTION Left 05/13/2014   Procedure: PERCUTANEOUS STENT INTERVENTION;  Surgeon: Wellington Hampshire, MD;  Location: [facility] CATH LAB;  Service: Cardiovascular;  Laterality: Left;  COMMON ILLIAC  . re-exploratin of laminectomy  05/2008   (RT L2-3) WITH REDO MICRODISKECTOMY        Home Medications    Prior to Admission medications   Medication Sig Start Date End Date Taking? Authorizing Provider  acetaminophen (TYLENOL 8 HOUR ARTHRITIS PAIN) 650 MG CR tablet Take 650 mg by mouth every 8 (eight) hours as needed for pain (arthritis).    [provider]  acetaminophen (TYLENOL) 325 MG tablet Take 650 mg by mouth every 6 (six) hours as needed (pain).     [provider]  amLODipine (NORVASC) 10 MG tablet Take 10 mg by mouth daily. 08/16/16   [provider]  aspirin EC 81 MG tablet Take 81 mg by mouth at bedtime.    [provider]  atorvastatin (LIPITOR) 20 MG tablet Take 20 mg by mouth daily.  04/14/18   [provider]  docusate sodium (COLACE) 100 MG capsule Take 1 capsule (100 mg total) by mouth 2 (two) times daily. 07/11/18   Cherylann Ratel A, DO  esomeprazole (NEXIUM) 40 MG capsule Take 40 mg by mouth daily. 06/18/18   [provider]  gabapentin (NEURONTIN) 100 MG capsule Take 100 mg by mouth 2 (two) times daily.    [provider]  glipiZIDE (GLUCOTROL) 10 MG tablet Take 10 mg by mouth 2 (two) times daily.     [provider]  insulin glargine (LANTUS SOLOSTAR)  100 UNIT/ML injection Inject 15 Units into the skin at bedtime.     [provider]  lisinopril-hydrochlorothiazide (PRINZIDE,ZESTORETIC) 20-12.5 MG per tablet Take 1 tablet by mouth daily.      [provider]  Multiple Vitamin (MULTIVITAMIN WITH MINERALS) TABS tablet Take 1 tablet by mouth daily.    [provider]  nitroGLYCERIN (NITROSTAT) 0.4 MG SL tablet Place 1 tablet (0.4 mg total) under the tongue every 5 (five) minutes as needed for chest pain. 05/02/18 05/01/19  Josue Hector, MD  polyethylene glycol (MIRALAX / GLYCOLAX) 17 g packet Take 17 g by mouth daily as needed for severe constipation. 07/11/18   Cherylann Ratel A, DO  ranolazine (RANEXA) 500 MG 12 hr tablet Take 1 tablet (500 mg total) by mouth 2 (two) times daily. 06/10/18   Burtis Junes, NP  tamsulosin (FLOMAX) 0.4 MG CAPS capsule Take 0.4 mg by mouth daily after supper.    [provider]    Family History Family History  Problem Relation Age of Onset  . Heart attack Father        died of MI @ 48  . Diabetes Brother        type 2  . Other Mother        died @ 23  . Coronary artery disease Sister        alive  .  Other Other        no fh of colon cancer  . Coronary artery disease Brother        s/p CABG.  Alive @ 18    Social History Social History   Tobacco Use  . Smoking status: Never Smoker  . Smokeless tobacco: Never Used  Substance Use Topics  . Alcohol use: No  . Drug use: No     Allergies   Rosuvastatin calcium; Iohexol; and Statins   Review of Systems Review of Systems  Constitutional: Positive for appetite change and fatigue. Negative for fever.  HENT: Positive for hearing loss.   Respiratory: Negative for shortness of breath.   Cardiovascular: Positive for chest pain.  Gastrointestinal: Negative for abdominal pain.  Genitourinary: Negative for dysuria.  Musculoskeletal:       Pelvic pain.  Skin: Negative for pallor.  Neurological: Positive for headaches. Negative for weakness.  Psychiatric/Behavioral: Positive for confusion.     Physical Exam Updated Vital Signs BP (!) 145/64 (BP Location: Right Arm)   Pulse 83   Temp 98.3 F (36.8 C) (Oral)   Resp (!) 24   SpO2 99%   Physical Exam Vitals signs and nursing note reviewed.  Constitutional:      Appearance: He is well-developed.  HENT:     Head: Atraumatic.  Neck:     Musculoskeletal: Neck supple.  Cardiovascular:     Rate and Rhythm: Regular rhythm.     Heart sounds: No murmur.  Pulmonary:     Effort: No tachypnea.  Chest:     Chest wall: No deformity or crepitus.  Abdominal:     Comments: Suprapubic tenderness.  Musculoskeletal:     Comments: Mild edema bilateral lower extremities.  Skin:    General: Skin is warm.     Capillary Refill: Capillary refill takes less than 2 seconds.  Neurological:     Mental Status: He is alert.     Comments: Awake and appropriate, but has difficulty expressing what happened today.      ED Treatments / Results  Labs (all labs ordered are  listed, but only abnormal results are displayed) Labs Reviewed  SARS CORONAVIRUS 2 (HOSPITAL ORDER, [place] [facility] LAB)  COMPREHENSIVE METABOLIC PANEL  TROPONIN I  CBC WITH DIFFERENTIAL/PLATELET  URINALYSIS, ROUTINE W REFLEX MICROSCOPIC    EKG EKG Interpretation  Date/Time:  Saturday July 20 2018 17:38:23 EDT Ventricular Rate:  78 PR Interval:    QRS Duration: 107 QT Interval:  407 QTC Calculation: 464 R Axis:   -97 Text Interpretation:  Sinus rhythm Left atrial enlargement Inferior infarct, old Probable anteroseptal infarct, old Confirmed by Davonna Belling [phone]) on 07/20/2018 5:47:52 PM   Radiology Ct Head Wo Contrast  Result Date: 07/20/2018 CLINICAL DATA:  78 year old and recent fall.  Decreased hearing. EXAM: CT HEAD WITHOUT CONTRAST TECHNIQUE: Contiguous axial images were obtained from the base of the skull through the vertex without intravenous contrast. COMPARISON:  Brain MRI 05/29/2014 FINDINGS: Brain: Intraventricular hemorrhage in the posterior aspect of the lateral ventricles, left side greater than right. Largest amount of blood is probably involving the left choroid plexus and could represent a shear injury. Dilatation of the lateral ventricles has mildly progressed since 2016. In addition, there is concern for a small amount of subarachnoid hemorrhage in the left frontal lobe on sequence 3, image 15. Low density in the periventricular and subcortical white matter is similar to the previous MRI findings. There is no evidence for midline shift, mass lesion or large infarct. Pituitary gland is mildly prominent but similar to the previous MRI findings. Vascular: No hyperdense vessel or unexpected calcification. Skull: Normal. Negative for fracture or focal lesion. Sinuses/Orbits: Mucosal disease in the ethmoid air cells. Probable mucosal disease in the posterior right sphenoid sinus. Other: None IMPRESSION: 1. Intraventricular hemorrhage involving bilateral lateral ventricles, left side greater than right. Findings could be related to a shear injury involving the left choroid  plexus. There is also concern for a small amount of subarachnoid blood in the left frontal lobe. 2. Dilatation of the lateral ventricles has mildly progressed since 2016. No evidence for obstructive hydrocephalus. Critical Value/emergent results were called by telephone at the time of interpretation on 07/20/2018 at 6:30 pm to Dr. Davonna Belling , who verbally acknowledged these results. Electronically Signed   By: Markus Daft M.D.   On: 07/20/2018 18:34   Dg Chest Portable 1 View  Result Date: 07/20/2018 CLINICAL DATA:  Chest pain. EXAM: PORTABLE CHEST 1 VIEW COMPARISON:  CT chest dated Jul 09, 2018. Chest x-ray dated Jul 08, 2018. FINDINGS: The heart size and mediastinal contours are within normal limits. Prior CABG. Atherosclerotic calcification of the aortic arch. Normal pulmonary vascularity. Persistent low lung volumes with mild bibasilar atelectasis. No focal consolidation, pleural effusion, or pneumothorax. No acute osseous abnormality. IMPRESSION: 1. Persistent low lung volumes with mild bibasilar atelectasis. Electronically Signed   By: Titus Dubin M.D.   On: 07/20/2018 18:02    Procedures Procedures (including critical care time)  Medications Ordered in ED Medications - No data to display   Initial Impression / Assessment and Plan / ED Course  I have reviewed the triage vital signs and the nursing notes.  Pertinent labs & imaging results that were available during my care of the patient were reviewed by me and considered in my medical decision making (see chart for details).        Patient presents with episode of near syncope.  Was coming from bathroom and had near syncopal episodes.  Patient states he had difficulty remembering and difficulty hearing since the  fall.  I do not see that any head imaging was done at the time and CT scan now shows intracranial hemorrhage.  States he may have hit his head with the fall previously but did not hit it today.  May require admission to  hospital with near syncope and the new blood in his head, although it potentially could have been there for a week it is still hyperdense.  Will discuss with neurosurgery. Care will be turned over to [facility].  Final Clinical Impressions(s) / ED Diagnoses   Final diagnoses:  Intraventricular hemorrhage ([place])  Near syncope    ED Discharge Orders    None       Davonna Belling, MD 07/20/18 1840

## 2018-07-20 NOTE — ED Notes (Signed)
Pt spoke to wife on phone, declines request for RN to call and update

## 2018-07-20 NOTE — ED Triage Notes (Signed)
Pt ambulating with assistance to bathroom and had midsternal chest pain, nausea and 2 episodes of vomiting. Pain free after ASA and 1 nitro. Pt pale and having weakness on EMS arrival. 4mg  zofran given. EKG unremarkable NSR 80. NS 673ml given. Bilateral 20 g AC. Generalized weakess x 2 weeks since pelvic fracture.

## 2018-07-21 ENCOUNTER — Observation Stay (HOSPITAL_COMMUNITY): Payer: PPO

## 2018-07-21 ENCOUNTER — Other Ambulatory Visit: Payer: Self-pay

## 2018-07-21 DIAGNOSIS — Z7982 Long term (current) use of aspirin: Secondary | ICD-10-CM | POA: Diagnosis not present

## 2018-07-21 DIAGNOSIS — F0781 Postconcussional syndrome: Secondary | ICD-10-CM | POA: Diagnosis present

## 2018-07-21 DIAGNOSIS — E1151 Type 2 diabetes mellitus with diabetic peripheral angiopathy without gangrene: Secondary | ICD-10-CM | POA: Diagnosis present

## 2018-07-21 DIAGNOSIS — I2510 Atherosclerotic heart disease of native coronary artery without angina pectoris: Secondary | ICD-10-CM | POA: Diagnosis present

## 2018-07-21 DIAGNOSIS — K219 Gastro-esophageal reflux disease without esophagitis: Secondary | ICD-10-CM | POA: Diagnosis present

## 2018-07-21 DIAGNOSIS — Z833 Family history of diabetes mellitus: Secondary | ICD-10-CM | POA: Diagnosis not present

## 2018-07-21 DIAGNOSIS — I615 Nontraumatic intracerebral hemorrhage, intraventricular: Secondary | ICD-10-CM | POA: Diagnosis present

## 2018-07-21 DIAGNOSIS — E7800 Pure hypercholesterolemia, unspecified: Secondary | ICD-10-CM | POA: Diagnosis present

## 2018-07-21 DIAGNOSIS — Z951 Presence of aortocoronary bypass graft: Secondary | ICD-10-CM | POA: Diagnosis not present

## 2018-07-21 DIAGNOSIS — W19XXXA Unspecified fall, initial encounter: Secondary | ICD-10-CM | POA: Diagnosis not present

## 2018-07-21 DIAGNOSIS — S06359D Traumatic hemorrhage of left cerebrum with loss of consciousness of unspecified duration, subsequent encounter: Secondary | ICD-10-CM | POA: Diagnosis not present

## 2018-07-21 DIAGNOSIS — M5126 Other intervertebral disc displacement, lumbar region: Secondary | ICD-10-CM | POA: Diagnosis present

## 2018-07-21 DIAGNOSIS — E1159 Type 2 diabetes mellitus with other circulatory complications: Secondary | ICD-10-CM | POA: Diagnosis not present

## 2018-07-21 DIAGNOSIS — E785 Hyperlipidemia, unspecified: Secondary | ICD-10-CM | POA: Diagnosis present

## 2018-07-21 DIAGNOSIS — Z1159 Encounter for screening for other viral diseases: Secondary | ICD-10-CM | POA: Diagnosis not present

## 2018-07-21 DIAGNOSIS — Z8701 Personal history of pneumonia (recurrent): Secondary | ICD-10-CM | POA: Diagnosis not present

## 2018-07-21 DIAGNOSIS — H9190 Unspecified hearing loss, unspecified ear: Secondary | ICD-10-CM | POA: Diagnosis present

## 2018-07-21 DIAGNOSIS — Z9081 Acquired absence of spleen: Secondary | ICD-10-CM | POA: Diagnosis not present

## 2018-07-21 DIAGNOSIS — Z888 Allergy status to other drugs, medicaments and biological substances status: Secondary | ICD-10-CM | POA: Diagnosis not present

## 2018-07-21 DIAGNOSIS — W19XXXD Unspecified fall, subsequent encounter: Secondary | ICD-10-CM | POA: Diagnosis present

## 2018-07-21 DIAGNOSIS — I629 Nontraumatic intracranial hemorrhage, unspecified: Secondary | ICD-10-CM | POA: Diagnosis not present

## 2018-07-21 DIAGNOSIS — Z87442 Personal history of urinary calculi: Secondary | ICD-10-CM | POA: Diagnosis not present

## 2018-07-21 DIAGNOSIS — Z885 Allergy status to narcotic agent status: Secondary | ICD-10-CM | POA: Diagnosis not present

## 2018-07-21 DIAGNOSIS — Y92009 Unspecified place in unspecified non-institutional (private) residence as the place of occurrence of the external cause: Secondary | ICD-10-CM | POA: Diagnosis not present

## 2018-07-21 DIAGNOSIS — S32402D Unspecified fracture of left acetabulum, subsequent encounter for fracture with routine healing: Secondary | ICD-10-CM | POA: Diagnosis not present

## 2018-07-21 DIAGNOSIS — Z981 Arthrodesis status: Secondary | ICD-10-CM | POA: Diagnosis not present

## 2018-07-21 DIAGNOSIS — Z8249 Family history of ischemic heart disease and other diseases of the circulatory system: Secondary | ICD-10-CM | POA: Diagnosis not present

## 2018-07-21 DIAGNOSIS — Z794 Long term (current) use of insulin: Secondary | ICD-10-CM | POA: Diagnosis not present

## 2018-07-21 DIAGNOSIS — G44309 Post-traumatic headache, unspecified, not intractable: Secondary | ICD-10-CM | POA: Diagnosis present

## 2018-07-21 DIAGNOSIS — I10 Essential (primary) hypertension: Secondary | ICD-10-CM | POA: Diagnosis present

## 2018-07-21 LAB — CBC
HCT: 40 % (ref 39.0–52.0)
Hemoglobin: 13.9 g/dL (ref 13.0–17.0)
MCH: 31.3 pg (ref 26.0–34.0)
MCHC: 34.8 g/dL (ref 30.0–36.0)
MCV: 90.1 fL (ref 80.0–100.0)
Platelets: 317 10*3/uL (ref 150–400)
RBC: 4.44 MIL/uL (ref 4.22–5.81)
RDW: 12 % (ref 11.5–15.5)
WBC: 16.1 10*3/uL — ABNORMAL HIGH (ref 4.0–10.5)
nRBC: 0 % (ref 0.0–0.2)

## 2018-07-21 LAB — BASIC METABOLIC PANEL
Anion gap: 13 (ref 5–15)
BUN: 17 mg/dL (ref 8–23)
CO2: 27 mmol/L (ref 22–32)
Calcium: 9.2 mg/dL (ref 8.9–10.3)
Chloride: 92 mmol/L — ABNORMAL LOW (ref 98–111)
Creatinine, Ser: 0.94 mg/dL (ref 0.61–1.24)
GFR calc Af Amer: >60 mL/min (ref >60)
GFR calc non Af Amer: >60 mL/min (ref >60)
Glucose, Bld: 304 mg/dL — ABNORMAL HIGH (ref 70–99)
Potassium: 3.8 mmol/L (ref 3.5–5.1)
Sodium: 132 mmol/L — ABNORMAL LOW (ref 135–145)

## 2018-07-21 LAB — GLUCOSE, CAPILLARY
Glucose-Capillary: 285 mg/dL — ABNORMAL HIGH (ref 70–99)
Glucose-Capillary: 262 mg/dL — ABNORMAL HIGH (ref 70–99)
Glucose-Capillary: 214 mg/dL — ABNORMAL HIGH (ref 70–99)
Glucose-Capillary: 138 mg/dL — ABNORMAL HIGH (ref 70–99)
Glucose-Capillary: 197 mg/dL — ABNORMAL HIGH (ref 70–99)

## 2018-07-21 LAB — HEMOGLOBIN A1C
Hgb A1c MFr Bld: 7.9 % — ABNORMAL HIGH (ref 4.8–5.6)
Mean Plasma Glucose: 180.03 mg/dL

## 2018-07-21 MED ORDER — GABAPENTIN 100 MG PO CAPS
100.0000 mg | ORAL_CAPSULE | Freq: Two times a day (BID) | ORAL | Status: DC | PRN
  Administered 2018-07-22: 100 mg via ORAL
  Filled 2018-07-21: qty 1

## 2018-07-21 MED ORDER — SUCRALFATE 1 GM/10ML PO SUSP
1.0000 g | Freq: Three times a day (TID) | ORAL | Status: DC
  Administered 2018-07-21 – 2018-07-23 (×9): 1 g via ORAL
  Filled 2018-07-21 (×9): qty 10

## 2018-07-21 MED ORDER — INSULIN GLARGINE 100 UNIT/ML ~~LOC~~ SOLN
15.0000 [IU] | Freq: Every day | SUBCUTANEOUS | Status: DC
  Filled 2018-07-21: qty 0.15

## 2018-07-21 MED ORDER — ONDANSETRON HCL 4 MG/2ML IJ SOLN
4.0000 mg | Freq: Four times a day (QID) | INTRAMUSCULAR | Status: DC | PRN
  Administered 2018-07-21 – 2018-07-23 (×2): 4 mg via INTRAVENOUS
  Filled 2018-07-21 (×2): qty 2

## 2018-07-21 MED ORDER — BISACODYL 5 MG PO TBEC
5.0000 mg | DELAYED_RELEASE_TABLET | ORAL | Status: DC
  Administered 2018-07-21: 5 mg via ORAL
  Filled 2018-07-21 (×2): qty 1

## 2018-07-21 MED ORDER — PANTOPRAZOLE SODIUM 40 MG PO TBEC
40.0000 mg | DELAYED_RELEASE_TABLET | Freq: Every day | ORAL | Status: DC
  Administered 2018-07-21 – 2018-07-23 (×3): 40 mg via ORAL
  Filled 2018-07-21 (×3): qty 1

## 2018-07-21 MED ORDER — ATORVASTATIN CALCIUM 10 MG PO TABS
20.0000 mg | ORAL_TABLET | Freq: Every day | ORAL | Status: DC
  Administered 2018-07-21 – 2018-07-22 (×3): 20 mg via ORAL
  Filled 2018-07-21 (×3): qty 2

## 2018-07-21 MED ORDER — LISINOPRIL 20 MG PO TABS
20.0000 mg | ORAL_TABLET | Freq: Every day | ORAL | Status: DC
  Administered 2018-07-21 – 2018-07-23 (×3): 20 mg via ORAL
  Filled 2018-07-21 (×3): qty 1

## 2018-07-21 MED ORDER — TAMSULOSIN HCL 0.4 MG PO CAPS
0.4000 mg | ORAL_CAPSULE | Freq: Every day | ORAL | Status: DC
  Administered 2018-07-21 – 2018-07-23 (×3): 0.4 mg via ORAL
  Filled 2018-07-21 (×3): qty 1

## 2018-07-21 MED ORDER — ACETAMINOPHEN 325 MG PO TABS
650.0000 mg | ORAL_TABLET | Freq: Four times a day (QID) | ORAL | Status: DC | PRN
  Administered 2018-07-21 – 2018-07-23 (×4): 650 mg via ORAL
  Filled 2018-07-21 (×4): qty 2

## 2018-07-21 MED ORDER — LISINOPRIL-HYDROCHLOROTHIAZIDE 20-12.5 MG PO TABS: 1.0000 | ORAL_TABLET | Freq: Every day | ORAL | Status: DC

## 2018-07-21 MED ORDER — RANOLAZINE ER 500 MG PO TB12
500.0000 mg | ORAL_TABLET | Freq: Two times a day (BID) | ORAL | Status: DC
  Administered 2018-07-21 – 2018-07-23 (×6): 500 mg via ORAL
  Filled 2018-07-21 (×6): qty 1

## 2018-07-21 MED ORDER — ADULT MULTIVITAMIN W/MINERALS CH
1.0000 | ORAL_TABLET | Freq: Every day | ORAL | Status: DC
  Administered 2018-07-21 – 2018-07-23 (×3): 1 via ORAL
  Filled 2018-07-21 (×3): qty 1

## 2018-07-21 MED ORDER — HYDROCHLOROTHIAZIDE 12.5 MG PO CAPS: 12.5000 mg | ORAL_CAPSULE | Freq: Every day | ORAL | Status: DC

## 2018-07-21 MED ORDER — NITROGLYCERIN 0.4 MG SL SUBL: 0.4000 mg | SUBLINGUAL_TABLET | SUBLINGUAL | Status: DC | PRN

## 2018-07-21 MED ORDER — SENNA 8.6 MG PO TABS
1.0000 | ORAL_TABLET | Freq: Every evening | ORAL | Status: DC | PRN
  Administered 2018-07-22: 8.6 mg via ORAL
  Filled 2018-07-21: qty 1

## 2018-07-21 MED ORDER — AMLODIPINE BESYLATE 10 MG PO TABS
10.0000 mg | ORAL_TABLET | Freq: Every day | ORAL | Status: DC
  Administered 2018-07-21 – 2018-07-23 (×3): 10 mg via ORAL
  Filled 2018-07-21 (×3): qty 1

## 2018-07-21 NOTE — Progress Notes (Signed)
Patient ID: Ricardo Le, male   DOB: August 30, 1940, 78 y.o.   MRN: [id number] Subjective: Patient reports moderate holocephalic headache and generalized weakness.  He is not eating much.  Objective: Vital signs in last 24 hours: Temp:  [98.3 F (36.8 C)-98.9 F (37.2 C)] 98.9 F (37.2 C) (06/07 0422) Pulse Rate:  [72-83] 72 (06/07 0422) Resp:  [12-26] 12 (06/07 0116) BP: (108-158)/(63-74) 148/67 (06/07 0422) SpO2:  [93 %-100 %] 95 % (06/07 0422) Weight:  [19 kg] 67 kg (06/07 0116)  Intake/Output from previous day: 06/06 0701 - 06/07 0700 In: -  Out: 400 [Urine:400] Intake/Output this shift: No intake/output data recorded.  He is awake and alert and talking on the phone, he is extremely hard of hearing, there is no facial asymmetry or focal neurologic deficit.  He is not lethargic.  He follows commands briskly with all 4 extremities and seems appropriate.  Lab Results: Lab Results  Component Value Date   WBC 16.1 (H) 07/21/2018   HGB 13.9 07/21/2018   HCT 40.0 07/21/2018   MCV 90.1 07/21/2018   PLT 317 07/21/2018   Lab Results  Component Value Date   INR 1.03 02/20/2017   BMET Lab Results  Component Value Date   NA 132 (L) 07/21/2018   K 3.8 07/21/2018   CL 92 (L) 07/21/2018   CO2 27 07/21/2018   GLUCOSE 304 (H) 07/21/2018   BUN 17 07/21/2018   CREATININE 0.94 07/21/2018   CALCIUM 9.2 07/21/2018    Studies/Results: Ct Head Wo Contrast  Result Date: 07/21/2018 CLINICAL DATA:  Intracranial hemorrhage follow up EXAM: CT HEAD WITHOUT CONTRAST TECHNIQUE: Contiguous axial images were obtained from the base of the skull through the vertex without intravenous contrast. COMPARISON:  Head CT 07/20/2018 FINDINGS: Brain: Unchanged distribution of intraventricular blood within the lateral ventricles. No new site of hemorrhage. No midline shift or other mass effect. There is periventricular hypoattenuation compatible with chronic microvascular disease. Size and configuration of  the ventricles are unchanged. Vascular: Atherosclerotic calcification of the internal carotid arteries at the skull base. No abnormal hyperdensity of the major intracranial arteries or dural venous sinuses. Skull: The visualized skull base, calvarium and extracranial soft tissues are normal. Sinuses/Orbits: No fluid levels or advanced mucosal thickening of the visualized paranasal sinuses. No mastoid or middle ear effusion. The orbits are normal. IMPRESSION: Unchanged intraventricular blood.  No new site of hemorrhage. Electronically Signed   By: Ulyses Jarred M.D.   On: 07/21/2018 06:58   Ct Head Wo Contrast  Result Date: 07/20/2018 CLINICAL DATA:  78 year old and recent fall.  Decreased hearing. EXAM: CT HEAD WITHOUT CONTRAST TECHNIQUE: Contiguous axial images were obtained from the base of the skull through the vertex without intravenous contrast. COMPARISON:  Brain MRI 05/29/2014 FINDINGS: Brain: Intraventricular hemorrhage in the posterior aspect of the lateral ventricles, left side greater than right. Largest amount of blood is probably involving the left choroid plexus and could represent a shear injury. Dilatation of the lateral ventricles has mildly progressed since 2016. In addition, there is concern for a small amount of subarachnoid hemorrhage in the left frontal lobe on sequence 3, image 15. Low density in the periventricular and subcortical white matter is similar to the previous MRI findings. There is no evidence for midline shift, mass lesion or large infarct. Pituitary gland is mildly prominent but similar to the previous MRI findings. Vascular: No hyperdense vessel or unexpected calcification. Skull: Normal. Negative for fracture or focal lesion. Sinuses/Orbits: Mucosal disease in the  ethmoid air cells. Probable mucosal disease in the posterior right sphenoid sinus. Other: None IMPRESSION: 1. Intraventricular hemorrhage involving bilateral lateral ventricles, left side greater than right.  Findings could be related to a shear injury involving the left choroid plexus. There is also concern for a small amount of subarachnoid blood in the left frontal lobe. 2. Dilatation of the lateral ventricles has mildly progressed since 2016. No evidence for obstructive hydrocephalus. Critical Value/emergent results were called by telephone at the time of interpretation on 07/20/2018 at 6:30 pm to Dr. Davonna Belling , who verbally acknowledged these results. Electronically Signed   By: Markus Daft M.D.   On: 07/20/2018 18:34   Dg Chest Portable 1 View  Result Date: 07/20/2018 CLINICAL DATA:  Chest pain. EXAM: PORTABLE CHEST 1 VIEW COMPARISON:  CT chest dated Jul 09, 2018. Chest x-ray dated Jul 08, 2018. FINDINGS: The heart size and mediastinal contours are within normal limits. Prior CABG. Atherosclerotic calcification of the aortic arch. Normal pulmonary vascularity. Persistent low lung volumes with mild bibasilar atelectasis. No focal consolidation, pleural effusion, or pneumothorax. No acute osseous abnormality. IMPRESSION: 1. Persistent low lung volumes with mild bibasilar atelectasis. Electronically Signed   By: Titus Dubin M.D.   On: 07/20/2018 18:02    Assessment/Plan: No change in head CT with small amount of intraventricular blood layering posteriorly in the occipital horns.  There is no hydrocephalus and no mass-effect.  He does not need acute neurosurgical intervention.  Likely needs ENT evaluation for hearing loss and physical and occupational therapy to help with mobilization.  We will sign off but please call us for any further questions.  Estimated body mass index is 24.58 kg/m as calculated from the following:   Height as of this encounter: 5\' 5"  (1.651 m).   Weight as of this encounter: 67 kg.    LOS: 0 days    Eustace Moore 07/21/2018, 8:44 AM

## 2018-07-21 NOTE — Plan of Care (Signed)
  Problem: Education: Goal: Knowledge of General Education information will improve Description Including pain rating scale, medication(s)/side effects and non-pharmacologic comfort measures Outcome: Progressing   Problem: Health Behavior/Discharge Planning: Goal: Ability to manage health-related needs will improve Outcome: Progressing   Problem: Clinical Measurements: Goal: Ability to maintain clinical measurements within normal limits will improve Outcome: Progressing Goal: Will remain free from infection Outcome: Progressing Goal: Diagnostic test results will improve Outcome: Progressing Goal: Respiratory complications will improve Outcome: Progressing Goal: Cardiovascular complication will be avoided Outcome: Progressing   Problem: Coping: Goal: Level of anxiety will decrease Outcome: Progressing   Problem: Elimination: Goal: Will not experience complications related to bowel motility Outcome: Progressing Goal: Will not experience complications related to urinary retention Outcome: Progressing   Problem: Pain Managment: Goal: General experience of comfort will improve Outcome: Progressing   Problem: Safety: Goal: Ability to remain free from injury will improve Outcome: Progressing   Ival Bible, BSN, RN

## 2018-07-21 NOTE — Progress Notes (Signed)
Progress Note    Ricardo Le  GYI:[id number] DOB: Jul 15, 1940  DOA: 07/20/2018 PCP: Angelina Sheriff, MD    Brief Narrative:     Medical records reviewed and are as summarized below:  Ricardo Le is an 78 y.o. male with medical history significant of insulin-dependent diabetes, known coronary artery disease with stable angina hypertension, hyperlipidemia.  Patient fell approximately 2 weeks ago sustaining a acetabular fracture.  Was hospitalized from May 25 to May 28.  Pubic intervention was needed.  Home for physical therapy and touchdown weightbearing.  He does not report having an additional fall.  Patient's family noted that he was increasingly confused, complaining of a headache, and generally weak and for the symptoms he was brought to the emergency department for evaluation.  Assessment/Plan:   Active Problems:   Type 2 diabetes mellitus with vascular disease (HCC)   Elevated lipids   Essential hypertension   Chest pain   Intraventricular hemorrhage (HCC)   Intraventricular hemorrhage -repeat heat CT stable -neurosurgery consult: blood appears to be acute and not likely from prior fall, small amount of intraventricular blood layering posteriorly in the occipital horns.  There is no hydrocephalus and no mass-effect.  He does not need acute neurosurgical intervention.  -holding ASA -PT/OT  Gradual hearing loss post fall -will discuss with ENT in AM  Diabetes type 2- controlled --patient is on glipizide and basal insulin therapy at home.  Last hemoglobin A1c in April was 7.3% -continue Lantus 15 units nightly. -Glipizide will be held -SSI   Hypertension   -resume home meds  Acetabular fracture  - per wife home PT is not working well and she has a hard time managing him. She suggests that she may want him to have skilled rehab.   Patient is not safe to go home.  Prior admission, it was recommended he go to CIR-- will get PT/OT to see patient again    Family Communication/Anticipated D/C date and plan/Code Status   DVT prophylaxis: scd Code Status: Full Code.  Family Communication: spoke with wife Disposition Plan:    Medical Consultants:    NS  Subjective:   "I want to go home " C/o nausea  Objective:    Vitals:   07/20/18 2331 07/21/18 0116 07/21/18 0422 07/21/18 0848  BP:  (!) 158/67 (!) 148/67 135/69  Pulse: 76 73 72 80  Resp:  12  18  Temp:  98.7 F (37.1 C) 98.9 F (37.2 C) 98.9 F (37.2 C)  TempSrc:  Oral Oral Oral  SpO2: 94% 97% 95% 97%  Weight:  67 kg    Height:  5\' 5"  (1.651 m)      Intake/Output Summary (Last 24 hours) at 07/21/2018 1208 Last data filed at 07/21/2018 1134 Gross per 24 hour  Intake 120 ml  Output 600 ml  Net -480 ml   Filed Weights   07/21/18 0116  Weight: 67 kg    Exam: In bed, ill appearing rrr No increased work of breathing Very hard of hearing  Data Reviewed:   I have personally reviewed following labs and imaging studies:  Labs: Labs show the following:   Basic Metabolic Panel: Recent Labs  Lab 07/20/18 1841 07/21/18 0022  NA 130* 132*  K 3.7 3.8  CL 91* 92*  CO2 24 27  GLUCOSE 330* 304*  BUN 20 17  CREATININE 1.01 0.94  CALCIUM 9.1 9.2   GFR Estimated Creatinine Clearance: 58.2 mL/min (by C-G formula based on  SCr of 0.94 mg/dL). Liver Function Tests: Recent Labs  Lab 07/20/18 1841  AST 46*  ALT 71*  ALKPHOS 129*  BILITOT 1.1  PROT 7.4  ALBUMIN 3.3*   No results for input(s): LIPASE, AMYLASE in the last 168 hours. No results for input(s): AMMONIA in the last 168 hours. Coagulation profile No results for input(s): INR, PROTIME in the last 168 hours.  CBC: Recent Labs  Lab 07/20/18 1841 07/21/18 0022  WBC 20.1* 16.1*  NEUTROABS 17.6*  --   HGB 14.7 13.9  HCT 42.6 40.0  MCV 91.6 90.1  PLT 328 317   Cardiac Enzymes: Recent Labs  Lab 07/20/18 1841  TROPONINI <0.03   BNP (last 3 results) Recent Labs    07/04/18 1311  PROBNP 137    CBG: Recent Labs  Lab 07/21/18 0102 07/21/18 0606  GLUCAP 285* 262*   D-Dimer: No results for input(s): DDIMER in the last 72 hours. Hgb A1c: Recent Labs    07/21/18 0022  HGBA1C 7.9*   Lipid Profile: No results for input(s): CHOL, HDL, LDLCALC, TRIG, CHOLHDL, LDLDIRECT in the last 72 hours. Thyroid function studies: No results for input(s): TSH, T4TOTAL, T3FREE, THYROIDAB in the last 72 hours.  Invalid input(s): FREET3 Anemia work up: No results for input(s): VITAMINB12, FOLATE, FERRITIN, TIBC, IRON, RETICCTPCT in the last 72 hours. Sepsis Labs: Recent Labs  Lab 07/20/18 1841 07/21/18 0022  WBC 20.1* 16.1*    Microbiology Recent Results (from the past 240 hour(s))  SARS Coronavirus 2 (CEPHEID - Performed in [facility] lab), Hosp Order     Status: None   Collection Time: 07/20/18  7:34 PM  Result Value Ref Range Status   SARS Coronavirus 2 NEGATIVE NEGATIVE Final    Comment: (NOTE) If result is NEGATIVE SARS-CoV-2 target nucleic acids are NOT DETECTED. The SARS-CoV-2 RNA is generally detectable in upper and lower  respiratory specimens during the acute phase of infection. The lowest  concentration of SARS-CoV-2 viral copies this assay can detect is 250  copies / mL. A negative result does not preclude SARS-CoV-2 infection  and should not be used as the sole basis for treatment or other  patient management decisions.  A negative result may occur with  improper specimen collection / handling, submission of specimen other  than nasopharyngeal swab, presence of viral mutation(s) within the  areas targeted by this assay, and inadequate number of viral copies  (<250 copies / mL). A negative result must be combined with clinical  observations, patient history, and epidemiological information. If result is POSITIVE SARS-CoV-2 target nucleic acids are DETECTED. The SARS-CoV-2 RNA is generally detectable in upper and lower  respiratory specimens dur ing the  acute phase of infection.  Positive  results are indicative of active infection with SARS-CoV-2.  Clinical  correlation with patient history and other diagnostic information is  necessary to determine patient infection status.  Positive results do  not rule out bacterial infection or co-infection with other viruses. If result is PRESUMPTIVE POSTIVE SARS-CoV-2 nucleic acids MAY BE PRESENT.   A presumptive positive result was obtained on the submitted specimen  and confirmed on repeat testing.  While 2019 novel coronavirus  (SARS-CoV-2) nucleic acids may be present in the submitted sample  additional confirmatory testing may be necessary for epidemiological  and / or clinical management purposes  to differentiate between  SARS-CoV-2 and other Sarbecovirus currently known to infect humans.  If clinically indicated additional testing with an alternate test  methodology [phone]) is  advised. The SARS-CoV-2 RNA is generally  detectable in upper and lower respiratory sp ecimens during the acute  phase of infection. The expected result is Negative. Fact Sheet for Patients:  StrictlyIdeas.no Fact Sheet for Healthcare Providers: BankingDealers.co.za This test is not yet approved or cleared by the [place] FDA and has been authorized for detection and/or diagnosis of SARS-CoV-2 by FDA under an Emergency Use Authorization (EUA).  This EUA will remain in effect (meaning this test can be used) for the duration of the COVID-19 declaration under Section 564(b)(1) of the Act, 21 U.S.C. section 360bbb-3(b)(1), unless the authorization is terminated or revoked sooner. Performed at [facility], [place] [address], [place], [place] [address]     Procedures and diagnostic studies:  Ct Head Wo Contrast  Result Date: 07/21/2018 CLINICAL DATA:  Intracranial hemorrhage follow up EXAM: CT HEAD WITHOUT CONTRAST TECHNIQUE: Contiguous axial images were  obtained from the base of the skull through the vertex without intravenous contrast. COMPARISON:  Head CT 07/20/2018 FINDINGS: Brain: Unchanged distribution of intraventricular blood within the lateral ventricles. No new site of hemorrhage. No midline shift or other mass effect. There is periventricular hypoattenuation compatible with chronic microvascular disease. Size and configuration of the ventricles are unchanged. Vascular: Atherosclerotic calcification of the internal carotid arteries at the skull base. No abnormal hyperdensity of the major intracranial arteries or dural venous sinuses. Skull: The visualized skull base, calvarium and extracranial soft tissues are normal. Sinuses/Orbits: No fluid levels or advanced mucosal thickening of the visualized paranasal sinuses. No mastoid or middle ear effusion. The orbits are normal. IMPRESSION: Unchanged intraventricular blood.  No new site of hemorrhage. Electronically Signed   By: Ulyses Jarred M.D.   On: 07/21/2018 06:58   Ct Head Wo Contrast  Result Date: 07/20/2018 CLINICAL DATA:  78 year old and recent fall.  Decreased hearing. EXAM: CT HEAD WITHOUT CONTRAST TECHNIQUE: Contiguous axial images were obtained from the base of the skull through the vertex without intravenous contrast. COMPARISON:  Brain MRI 05/29/2014 FINDINGS: Brain: Intraventricular hemorrhage in the posterior aspect of the lateral ventricles, left side greater than right. Largest amount of blood is probably involving the left choroid plexus and could represent a shear injury. Dilatation of the lateral ventricles has mildly progressed since 2016. In addition, there is concern for a small amount of subarachnoid hemorrhage in the left frontal lobe on sequence 3, image 15. Low density in the periventricular and subcortical white matter is similar to the previous MRI findings. There is no evidence for midline shift, mass lesion or large infarct. Pituitary gland is mildly prominent but similar to  the previous MRI findings. Vascular: No hyperdense vessel or unexpected calcification. Skull: Normal. Negative for fracture or focal lesion. Sinuses/Orbits: Mucosal disease in the ethmoid air cells. Probable mucosal disease in the posterior right sphenoid sinus. Other: None IMPRESSION: 1. Intraventricular hemorrhage involving bilateral lateral ventricles, left side greater than right. Findings could be related to a shear injury involving the left choroid plexus. There is also concern for a small amount of subarachnoid blood in the left frontal lobe. 2. Dilatation of the lateral ventricles has mildly progressed since 2016. No evidence for obstructive hydrocephalus. Critical Value/emergent results were called by telephone at the time of interpretation on 07/20/2018 at 6:30 pm to Dr. Davonna Belling , who verbally acknowledged these results. Electronically Signed   By: Markus Daft M.D.   On: 07/20/2018 18:34   Dg Chest Portable 1 View  Result Date: 07/20/2018 CLINICAL DATA:  Chest  pain. EXAM: PORTABLE CHEST 1 VIEW COMPARISON:  CT chest dated Jul 09, 2018. Chest x-ray dated Jul 08, 2018. FINDINGS: The heart size and mediastinal contours are within normal limits. Prior CABG. Atherosclerotic calcification of the aortic arch. Normal pulmonary vascularity. Persistent low lung volumes with mild bibasilar atelectasis. No focal consolidation, pleural effusion, or pneumothorax. No acute osseous abnormality. IMPRESSION: 1. Persistent low lung volumes with mild bibasilar atelectasis. Electronically Signed   By: Titus Dubin M.D.   On: 07/20/2018 18:02    Medications:   . amLODipine  10 mg Oral Daily  . atorvastatin  20 mg Oral QHS  . bisacodyl  5 mg Oral Q72H  . insulin aspart  0-15 Units Subcutaneous TID WC  . insulin glargine  15 Units Subcutaneous QHS  . lisinopril  20 mg Oral Daily  . multivitamin with minerals  1 tablet Oral Daily  . pantoprazole  40 mg Oral Daily  . ranolazine  500 mg Oral BID  .  sucralfate  1 g Oral TID AC & HS  . tamsulosin  0.4 mg Oral QPC supper   Continuous Infusions:   LOS: 0 days   Geradine Girt  [facility]   How to contact the [facility] Attending or Consulting provider [place] or covering provider during after hours [place], for this patient?  1. Check the care team in [facility] and look for a) attending/consulting [facility] provider listed and b) the [facility] team listed 2. Log into [email or web address] and use [place] universal password to access. If you do not have the password, please contact the hospital operator. 3. Locate the [facility] provider you are looking for under [facility] and page to a number that you can be directly reached. 4. If you still have difficulty reaching the provider, please page the [facility] (Director on Call) for the Hospitalists listed on amion for assistance.  07/21/2018, 12:08 PM

## 2018-07-21 NOTE — Progress Notes (Signed)
Pt off unit at this time for CT.

## 2018-07-21 NOTE — Plan of Care (Signed)
Patient stable, unable to participate in POC at this time, discussed POC with patient's spouse, agreeable with plan, denies question/concerns at this time.

## 2018-07-22 DIAGNOSIS — W19XXXA Unspecified fall, initial encounter: Secondary | ICD-10-CM

## 2018-07-22 DIAGNOSIS — S32402A Unspecified fracture of left acetabulum, initial encounter for closed fracture: Secondary | ICD-10-CM | POA: Diagnosis not present

## 2018-07-22 DIAGNOSIS — Y92009 Unspecified place in unspecified non-institutional (private) residence as the place of occurrence of the external cause: Secondary | ICD-10-CM

## 2018-07-22 LAB — CBC
HCT: 41.5 % (ref 39.0–52.0)
Hemoglobin: 14.8 g/dL (ref 13.0–17.0)
MCH: 31.7 pg (ref 26.0–34.0)
MCHC: 35.7 g/dL (ref 30.0–36.0)
MCV: 88.9 fL (ref 80.0–100.0)
Platelets: 360 10*3/uL (ref 150–400)
RBC: 4.67 MIL/uL (ref 4.22–5.81)
RDW: 11.9 % (ref 11.5–15.5)
WBC: 13.7 10*3/uL — ABNORMAL HIGH (ref 4.0–10.5)
nRBC: 0 % (ref 0.0–0.2)

## 2018-07-22 LAB — BASIC METABOLIC PANEL
Anion gap: 12 (ref 5–15)
BUN: 12 mg/dL (ref 8–23)
CO2: 26 mmol/L (ref 22–32)
Calcium: 9.4 mg/dL (ref 8.9–10.3)
Chloride: 93 mmol/L — ABNORMAL LOW (ref 98–111)
Creatinine, Ser: 0.84 mg/dL (ref 0.61–1.24)
GFR calc Af Amer: >60 mL/min (ref >60)
GFR calc non Af Amer: >60 mL/min (ref >60)
Glucose, Bld: 149 mg/dL — ABNORMAL HIGH (ref 70–99)
Potassium: 3.7 mmol/L (ref 3.5–5.1)
Sodium: 131 mmol/L — ABNORMAL LOW (ref 135–145)

## 2018-07-22 LAB — GLUCOSE, CAPILLARY
Glucose-Capillary: 202 mg/dL — ABNORMAL HIGH (ref 70–99)
Glucose-Capillary: 202 mg/dL — ABNORMAL HIGH (ref 70–99)
Glucose-Capillary: 106 mg/dL — ABNORMAL HIGH (ref 70–99)
Glucose-Capillary: 153 mg/dL — ABNORMAL HIGH (ref 70–99)

## 2018-07-22 MED ORDER — BISACODYL 10 MG RE SUPP: 10.0000 mg | Freq: Every day | RECTAL | Status: DC | PRN

## 2018-07-22 MED ORDER — MELATONIN 3 MG PO TABS
3.0000 mg | ORAL_TABLET | Freq: Every day | ORAL | Status: DC
  Administered 2018-07-22: 3 mg via ORAL
  Filled 2018-07-22 (×2): qty 1

## 2018-07-22 MED ORDER — INSULIN GLARGINE 100 UNIT/ML ~~LOC~~ SOLN
18.0000 [IU] | Freq: Every day | SUBCUTANEOUS | Status: DC
  Administered 2018-07-22: 18 [IU] via SUBCUTANEOUS
  Filled 2018-07-22 (×2): qty 0.18

## 2018-07-22 NOTE — Progress Notes (Signed)
Inpatient Rehabilitation Admissions Coordinator  Inpatient Rehab Consult received. I met with patient at the bedside for rehabilitation assessment. I spoke with pt' s son as well as his wife by phone. They both prefer inpt rehab admit rather than direct return home. I will begin insurance authorization with [employer] in the morning.  Danne Baxter, RN, MSN Rehab Admissions Coordinator [phone] 07/22/2018 5:41 PM

## 2018-07-22 NOTE — Progress Notes (Signed)
PT WANTS TO CALL WIFE.AND MAD AS FIRE THAT HE CAN NOT DIAL HIS REMOTE FOR TV.  EXPLAINED TO HIM THAT WE COULD CALL HIS WIFE IN THE MORNING AROUND 0700.  PT STATING "I AM NOT DOING THIS ANYMORE YOU ALL TREAT ME  LIKE I AM IN JAIL AND I CAN NOT EVEN GET MY PHONE CALL.

## 2018-07-22 NOTE — Progress Notes (Signed)
Spoke with [facility] and daughter is allowed to come stay due to pt's confusion. Daughter [name] informed she is to not leave the room and to bring all needed items. Pt made aware and will continue to monitor.

## 2018-07-22 NOTE — Evaluation (Signed)
Physical Therapy Evaluation Patient Details Name: Ricardo Le MRN: [id number] DOB: [birth year] Today's Date: 07/22/2018   History of Present Illness  Pt is a 78 y/o male with a PMH significant for IDDM, CAD, HTN. Pt fell approx 2 weeks ago sustaining an acetabular fx (TDWB). He was hospitalized 5/25-5/28 and returned home at d/c. Pt presents back with worsening confusion, HA. CT revealed IVH involving bilateral lateral ventricles, left side greater than right, as well as concern for a small amount of subarachnoid blood in the left frontal lobe. Neurosurgery did not recommend acute intervention.   Clinical Impression  Pt admitted with above diagnosis. Pt currently with functional limitations due to the deficits listed below (see PT Problem List). At the time of PT eval pt was able to perform transfers with up to +2 mod assist for balance support and safety. Pt confused, and communication was difficult 2 HOH. Pt not maintaining true TDEB status however was limiting weight through LLE due to pain. Pt's son called during session and therapist was able to get some PLOF information. Son, Merry Proud reports that since return home from prior admission pt has been "living in the recliner", and pt has required +2 assist for transfer to/from [facility]. Pt has a walker at home but has been unable to use it. Merry Proud reports that he is not able to be present 24/7 and mobilizing pt has been difficult for his sister and mother who have been primarily with pt during the day. Merry Proud is open to exploring continued rehab prior to return home to lessen burden of care on his sister and mother. Recommending CIR consult, as pt was independent and functional prior to initial fall, and will have good support upon d/c. Acutely, pt will benefit from skilled PT to increase their independence and safety with mobility to allow discharge to the venue listed below.       Follow Up Recommendations CIR;Supervision/Assistance - 24 hour    Equipment  Recommendations  Wheelchair (measurements PT)    Recommendations for Other Services Rehab consult     Precautions / Restrictions Precautions Precautions: Fall Restrictions Weight Bearing Restrictions: No LLE Weight Bearing: Touchdown weight bearing      Mobility  Bed Mobility Overal bed mobility: Needs Assistance Bed Mobility: Supine to Sit     Supine to sit: Mod assist     General bed mobility comments: Heavy mod assist for transition to EOB. Pt reaching for therapist's hand to pull into long sitting, and bed pad utilized to scoot around fully to get feet on the floor.   Transfers Overall transfer level: Needs assistance Equipment used: Rolling walker (2 wheeled) Transfers: Sit to/from Stand Sit to Stand: Mod assist;+2 physical assistance;From elevated surface Stand pivot transfers: Mod assist;+2 physical assistance       General transfer comment: VC's for WB status, however pt had difficulty maintaining. Pain limiting weightbearing but pt was not truly TDWB during transfer.   Ambulation/Gait             General Gait Details: Unable to progress to gait training  Stairs            Wheelchair Mobility    Modified Rankin (Stroke Patients Only) Modified Rankin (Stroke Patients Only) Pre-Morbid Rankin Score: No symptoms Modified Rankin: Severe disability     Balance Overall balance assessment: History of Falls;Needs assistance Sitting-balance support: No upper extremity supported;Feet supported Sitting balance-Leahy Scale: Poor Sitting balance - Comments: Psoterior lean, pulling on edge of mattress for support  Standing balance support: Bilateral upper extremity supported Standing balance-Leahy Scale: Poor Standing balance comment: dependent on BUE to attempt to maintain TDWB status                             Pertinent Vitals/Pain Pain Assessment: Faces Faces Pain Scale: Hurts a little bit Pain Location: Sore left hip. Moves it  freely however only endorses pain with attempts at weightbearing.  Pain Descriptors / Indicators: Aching;Discomfort Pain Intervention(s): Limited activity within patient's tolerance;Monitored during session;Repositioned    Home Living Family/patient expects to be discharged to:: Private residence Living Arrangements: Spouse/significant other Available Help at Discharge: Family;Available 24 hours/day Type of Home: House Home Access: Stairs to enter Entrance Stairs-Rails: None Entrance Stairs-Number of Steps: 1 Home Layout: One level Home Equipment: Bedside commode;Walker - 2 wheels;Shower seat - built in;Grab bars - tub/shower;Hand held shower head      Prior Function Level of Independence: Independent               Hand Dominance   Dominant Hand: Right    Extremity/Trunk Assessment   Upper Extremity Assessment Upper Extremity Assessment: Defer to OT evaluation    Lower Extremity Assessment Lower Extremity Assessment: LLE deficits/detail LLE Deficits / Details: decreased strength and ROM due to pain LLE: Unable to fully assess due to pain    Cervical / Trunk Assessment Cervical / Trunk Assessment: Normal  Communication   Communication: HOH  Cognition Arousal/Alertness: Awake/alert Behavior During Therapy: Restless;Impulsive Overall Cognitive Status: Impaired/Different from baseline Area of Impairment: Orientation;Attention;Memory;Following commands;Safety/judgement;Problem solving;Awareness                 Orientation Level: Disoriented to;Situation;Place;Time Current Attention Level: Sustained Memory: Decreased short-term memory Following Commands: Follows one step commands consistently;Follows one step commands with increased time Safety/Judgement: Decreased awareness of safety;Decreased awareness of deficits Awareness: Intellectual Problem Solving: Slow processing;Decreased initiation;Difficulty sequencing;Requires verbal cues;Requires tactile  cues General Comments: Difficulty communicating 2 extremely HOH      General Comments      Exercises     Assessment/Plan    PT Assessment Patient needs continued PT services  PT Problem List Decreased strength;Decreased range of motion;Decreased activity tolerance;Decreased balance;Decreased mobility;Decreased coordination;Decreased cognition;Decreased knowledge of use of DME;Decreased safety awareness;Decreased knowledge of precautions;Pain       PT Treatment Interventions DME instruction;Functional mobility training;Balance training;Patient/family education;Gait training;Therapeutic activities;Stair training;Therapeutic exercise    PT Goals (Current goals can be found in the Care Plan section)  Acute Rehab PT Goals Patient Stated Goal: None stated - pt unable PT Goal Formulation: Patient unable to participate in goal setting Time For Goal Achievement: 08/05/18 Potential to Achieve Goals: Good    Frequency Min 3X/week   Barriers to discharge Decreased caregiver support Has 24 hour support but family not able to physically assist him at the level he currently needs.     Co-evaluation PT/OT/SLP Co-Evaluation/Treatment: Yes Reason for Co-Treatment: Complexity of the patient's impairments (multi-system involvement);Necessary to address cognition/behavior during functional activity;For patient/therapist safety;To address functional/ADL transfers PT goals addressed during session: Mobility/safety with mobility;Balance         AM-PAC PT "6 Clicks" Mobility  Outcome Measure Help needed turning from your back to your side while in a flat bed without using bedrails?: A Little Help needed moving from lying on your back to sitting on the side of a flat bed without using bedrails?: A Lot Help needed moving to and from a bed to a chair (  including a wheelchair)?: A Lot Help needed standing up from a chair using your arms (e.g., wheelchair or bedside chair)?: A Lot Help needed to walk  in hospital room?: Total Help needed climbing 3-5 steps with a railing? : Total 6 Click Score: 11    End of Session Equipment Utilized During Treatment: Gait belt Activity Tolerance: Patient tolerated treatment well Patient left: in chair;with call bell/phone within reach;with chair alarm set(lap belt alarm) Nurse Communication: Mobility status;Weight bearing status PT Visit Diagnosis: Unsteadiness on feet (R26.81);Muscle weakness (generalized) (M62.81);Other abnormalities of gait and mobility (R26.89);History of falling (Z91.81);Difficulty in walking, not elsewhere classified (R26.2);Pain Pain - Right/Left: Left Pain - part of body: Hip;Leg    Time: 1031-5945 PT Time Calculation (min) (ACUTE ONLY): 29 min   Charges:   PT Evaluation $PT Eval Moderate Complexity: 1 Mod          Rolinda Roan, PT, DPT Acute Rehabilitation Services Pager: [phone] Office: [phone]   Thelma Comp 07/22/2018, 10:14 AM

## 2018-07-22 NOTE — Evaluation (Signed)
Occupational Therapy Evaluation Patient Details Name: Ricardo Le MRN: [id number] DOB: Aug 07, 1940 Today's Date: 07/22/2018    History of Present Illness Pt is a 78 y/o male with a PMH significant for IDDM, CAD, HTN. Pt fell approx 2 weeks ago sustaining an acetabular fx (TDWB). He was hospitalized 5/25-5/28 and returned home at d/c. Pt presents back with worsening confusion, HA. CT revealed IVH involving bilateral lateral ventricles, left side greater than right, as well as concern for a small amount of subarachnoid blood in the left frontal lobe. Neurosurgery did not recommend acute intervention.    Clinical Impression   Pt admitted with above diagnoses, pain, cognition and decreased activity tolerance limiting ability to engage in safe BADL at desired level of ind. At time of eval, pt displaying confusion. In regard to location he believes he is at "the rehab down the street", he unable to name the city. No recall of date, mostly oriented to self. He completed bed mobility at mod A level and functional transfers at mod A +2 with significant cueing to maintain TDWB status. He is mod A for LB dressing, can get RLE but LLE needs assist. Pt is very HOH and did report that this got increasingly worse from his last fall- pt having difficulty communicating throughout session and poor historian with cog. Son Ricardo Le) called during session and provided collateral. He stated that pt had been "living in the recliner" and that himself and pt dtr had been caring for pt significantly (+2 to [facility]), mostly wife and dtr at home with pt all day due to son work schedule. At this time recommending CIR consult for pt was functionally independent prior to fall and has adequate support at home. Pt will benefit acute OT to address areas described below. Will follow per POC.    Follow Up Recommendations  CIR;Supervision/Assistance - 24 hour    Equipment Recommendations  Other (comment)(defer to next venue)     Recommendations for Other Services       Precautions / Restrictions Precautions Precautions: Fall Restrictions Weight Bearing Restrictions: No LLE Weight Bearing: Touchdown weight bearing      Mobility Bed Mobility Overal bed mobility: Needs Assistance Bed Mobility: Supine to Sit     Supine to sit: Mod assist     General bed mobility comments: heavy mod to EOB, needing assist to pull to sit and for bed pad to right hips  Transfers Overall transfer level: Needs assistance Equipment used: Rolling walker (2 wheeled) Transfers: Sit to/from Stand Sit to Stand: Mod assist;+2 physical assistance;From elevated surface Stand pivot transfers: Mod assist;+2 physical assistance       General transfer comment: consistent VC's needed for WB status, pt difficulty maintaining (suspect HOH and cog)    Balance Overall balance assessment: History of Falls;Needs assistance Sitting-balance support: No upper extremity supported;Feet supported Sitting balance-Leahy Scale: Poor Sitting balance - Comments: Posterior lean, needing both hands to pull for support   Standing balance support: Bilateral upper extremity supported Standing balance-Leahy Scale: Poor Standing balance comment: reliant on BUE support                           ADL either performed or assessed with clinical judgement   ADL Overall ADL's : Needs assistance/impaired Eating/Feeding: Set up;Sitting   Grooming: Set up;Sitting;Wash/dry hands;Wash/dry face;Oral care Grooming Details (indicate cue type and reason): not able to maintain standing position, needs sitting- okay with automatic tasks, some cueing due to cognition  for sequencing Upper Body Bathing: Set up;Sitting   Lower Body Bathing: Moderate assistance;Sitting/lateral leans Lower Body Bathing Details (indicate cue type and reason): assist for LLE     Lower Body Dressing: Moderate assistance;Sit to/from stand Lower Body Dressing Details (indicate  cue type and reason): requires assist for LLE, can access RLE Toilet Transfer: Moderate assistance;+2 for physical assistance;+2 for safety/equipment;Cueing for safety;Cueing for sequencing;BSC;RW   Toileting- Clothing Manipulation and Hygiene: Sit to/from stand;Maximal assistance   Tub/ Shower Transfer: Walk-in shower;Moderate assistance;Ambulation;Rolling walker;+2 for physical assistance;+2 for safety/equipment   Functional mobility during ADLs: Moderate assistance;+2 for safety/equipment;Rolling walker;Cueing for safety;Cueing for sequencing General ADL Comments: pt very HOH and with increased confusion this date; able to tolerate stand pivot with mod +2- not consistently following WB     Vision Baseline Vision/History: Wears glasses Patient Visual Report: No change from baseline Additional Comments: pt reports wearing glasses but poor historian regarding vision- able to read print at reading distance     Perception     Praxis      Pertinent Vitals/Pain Pain Assessment: Faces Faces Pain Scale: Hurts a little bit Pain Location: Sore left hip. Moves it freely however only endorses pain with attempts at weightbearing.  Pain Descriptors / Indicators: Aching;Discomfort Pain Intervention(s): Limited activity within patient's tolerance;Monitored during session;Repositioned     Hand Dominance Right   Extremity/Trunk Assessment Upper Extremity Assessment Upper Extremity Assessment: Overall WFL for tasks assessed(grossly 4/5, no ROM limitations)   Lower Extremity Assessment Lower Extremity Assessment: Defer to PT evaluation LLE Deficits / Details: decreased strength and ROM due to pain LLE: Unable to fully assess due to pain   Cervical / Trunk Assessment Cervical / Trunk Assessment: Normal   Communication Communication Communication: HOH   Cognition Arousal/Alertness: Awake/alert Behavior During Therapy: Restless;Impulsive Overall Cognitive Status: Impaired/Different from  baseline Area of Impairment: Orientation;Attention;Memory;Following commands;Safety/judgement;Problem solving;Awareness                 Orientation Level: Disoriented to;Situation;Place;Time Current Attention Level: Sustained Memory: Decreased short-term memory Following Commands: Follows one step commands consistently;Follows one step commands with increased time Safety/Judgement: Decreased awareness of safety;Decreased awareness of deficits Awareness: Intellectual Problem Solving: Slow processing;Decreased initiation;Difficulty sequencing;Requires verbal cues;Requires tactile cues General Comments: difficult due to pt being extremely [facility]   General Comments       Exercises     Shoulder Instructions      Home Living Family/patient expects to be discharged to:: Private residence Living Arrangements: Spouse/significant other Available Help at Discharge: Family;Available 24 hours/day Type of Home: House Home Access: Stairs to enter [employer] of Steps: 1 Entrance Stairs-Rails: None Home Layout: One level     Bathroom Shower/Tub: Occupational psychologist: Standard     Home Equipment: Bedside commode;Walker - 2 wheels;Shower seat - built in;Grab bars - tub/shower;Hand held shower head          Prior Functioning/Environment Level of Independence: Independent                 OT Problem List: Decreased range of motion;Decreased activity tolerance;Impaired balance (sitting and/or standing);Decreased cognition;Decreased safety awareness;Decreased knowledge of use of DME or AE;Decreased knowledge of precautions;Pain      OT Treatment/Interventions: Self-care/ADL training;DME and/or AE instruction;Therapeutic activities;Patient/family education;Balance training;Cognitive remediation/compensation    OT Goals(Current goals can be found in the care plan section) Acute Rehab OT Goals Patient Stated Goal: None stated - pt unable OT Goal Formulation:  With patient Time For Goal Achievement: 08/05/18 Potential to  Achieve Goals: Good  OT Frequency: Min 2X/week   Barriers to D/C:            Co-evaluation PT/OT/SLP Co-Evaluation/Treatment: Yes Reason for Co-Treatment: Complexity of the patient's impairments (multi-system involvement);Necessary to address cognition/behavior during functional activity;To address functional/ADL transfers;For patient/therapist safety PT goals addressed during session: Mobility/safety with mobility;Balance OT goals addressed during session: ADL's and self-care;Proper use of Adaptive equipment and DME      AM-PAC OT "6 Clicks" Daily Activity     Outcome Measure Help from another person eating meals?: None Help from another person taking care of personal grooming?: None(seated) Help from another person toileting, which includes using toliet, bedpan, or urinal?: A Lot Help from another person bathing (including washing, rinsing, drying)?: A Lot Help from another person to put on and taking off regular upper body clothing?: A Little Help from another person to put on and taking off regular lower body clothing?: A Lot 6 Click Score: 17   End of Session Equipment Utilized During Treatment: Gait belt Nurse Communication: Mobility status  Activity Tolerance: Patient tolerated treatment well Patient left: in chair;with call bell/phone within reach;with chair alarm set;Other (comment)(belt alarm)  OT Visit Diagnosis: Unsteadiness on feet (R26.81);Other abnormalities of gait and mobility (R26.89);History of falling (Z91.81);Other symptoms and signs involving cognitive function;Pain Pain - Right/Left: Left Pain - part of body: Hip                Time: 9179-1505 OT Time Calculation (min): 28 min Charges:  OT General Charges $OT Visit: 1 Visit OT Evaluation $OT Eval Moderate Complexity: [place], MSOT, OTR/L [employer] OT/ Acute Relief OT [facility] Office: [phone]  Zenovia Jarred 07/22/2018, 12:37 PM

## 2018-07-22 NOTE — Progress Notes (Signed)
Progress Note    Ricardo Le  NLZ:[id number] DOB: 1940-11-21  DOA: 07/20/2018 PCP: Angelina Sheriff, MD    Brief Narrative:     Medical records reviewed and are as summarized below:  Ricardo Le is an 78 y.o. male with medical history significant of insulin-dependent diabetes, known coronary artery disease with stable angina hypertension, hyperlipidemia.  Patient fell approximately 2 weeks ago sustaining a acetabular fracture.  Was hospitalized from May 25 to May 28.   Patient's family noted that he was increasingly confused, complaining of a headache, and generally weak and for the symptoms he was brought to the emergency department for evaluation.  Assessment/Plan:   Active Problems:   Type 2 diabetes mellitus with vascular disease (HCC)   Elevated lipids   Essential hypertension   Chest pain   Intraventricular hemorrhage (HCC)   Intraventricular hemorrhage -repeat heat CT stable -neurosurgery consult: blood appears to be acute and not likely from prior fall, small amount of intraventricular blood layering posteriorly in the occipital horns.  There is no hydrocephalus and no mass-effect.  He does not need acute neurosurgical intervention.  -holding ASA -PT/OT-CIR  Gradual hearing loss post fall -outpatient ENT follow up -I looked in canal-- no cerum impaction  Diabetes type 2- controlled --patient is on glipizide and basal insulin therapy at home.  Last hemoglobin A1c in April was 7.3% - Lantus 18 units nightly. -Glipizide will be held -SSI   Hypertension   -resume home meds  Acetabular fracture  - TDWB LLE -outpatient follow up with Dr. Percell Miller   Patient is not safe to go home.  Prior admission, it was recommended he go to CIR--will place consult  Family Communication/Anticipated D/C date and plan/Code Status   DVT prophylaxis: scd Code Status: Full Code.  Family Communication: spoke with wife 6/7 Disposition Plan: CIR?   Medical  Consultants:    NS  CIR  Subjective:   confused  Objective:    Vitals:   07/21/18 2056 07/21/18 2101 07/22/18 0741 07/22/18 1140  BP: (!) 146/69 130/65 (!) 145/71 (!) 150/68  Pulse: 75 72 77 77  Resp: 16 16 18 18   Temp: 99.1 F (37.3 C) 99.1 F (37.3 C) 98.2 F (36.8 C) 98.8 F (37.1 C)  TempSrc: Oral Oral Oral Oral  SpO2: 96% 95% 95% 97%  Weight:      Height:        Intake/Output Summary (Last 24 hours) at 07/22/2018 1332 Last data filed at 07/22/2018 1000 Gross per 24 hour  Intake 120 ml  Output 1225 ml  Net -1105 ml   Filed Weights   07/21/18 0116  Weight: 67 kg    Exam: Impulsive, working with physical therapy rrr No increased work of breathing Very hard of hearing   Data Reviewed:   I have personally reviewed following labs and imaging studies:  Labs: Labs show the following:   Basic Metabolic Panel: Recent Labs  Lab 07/20/18 1841 07/21/18 0022 07/22/18 1027  NA 130* 132* 131*  K 3.7 3.8 3.7  CL 91* 92* 93*  CO2 24 27 26   GLUCOSE 330* 304* 149*  BUN 20 17 12   CREATININE 1.01 0.94 0.84  CALCIUM 9.1 9.2 9.4   GFR Estimated Creatinine Clearance: 65.1 mL/min (by C-G formula based on SCr of 0.84 mg/dL). Liver Function Tests: Recent Labs  Lab 07/20/18 1841  AST 46*  ALT 71*  ALKPHOS 129*  BILITOT 1.1  PROT 7.4  ALBUMIN 3.3*  No results for input(s): LIPASE, AMYLASE in the last 168 hours. No results for input(s): AMMONIA in the last 168 hours. Coagulation profile No results for input(s): INR, PROTIME in the last 168 hours.  CBC: Recent Labs  Lab 07/20/18 1841 07/21/18 0022 07/22/18 1027  WBC 20.1* 16.1* 13.7*  NEUTROABS 17.6*  --   --   HGB 14.7 13.9 14.8  HCT 42.6 40.0 41.5  MCV 91.6 90.1 88.9  PLT 328 317 360   Cardiac Enzymes: Recent Labs  Lab 07/20/18 1841  TROPONINI <0.03   BNP (last 3 results) Recent Labs    07/04/18 1311  PROBNP 137   CBG: Recent Labs  Lab 07/21/18 1252 07/21/18 1630 07/21/18 2022  07/22/18 0744 07/22/18 1143  GLUCAP 214* 138* 197* 202* 202*   D-Dimer: No results for input(s): DDIMER in the last 72 hours. Hgb A1c: Recent Labs    07/21/18 0022  HGBA1C 7.9*   Lipid Profile: No results for input(s): CHOL, HDL, LDLCALC, TRIG, CHOLHDL, LDLDIRECT in the last 72 hours. Thyroid function studies: No results for input(s): TSH, T4TOTAL, T3FREE, THYROIDAB in the last 72 hours.  Invalid input(s): FREET3 Anemia work up: No results for input(s): VITAMINB12, FOLATE, FERRITIN, TIBC, IRON, RETICCTPCT in the last 72 hours. Sepsis Labs: Recent Labs  Lab 07/20/18 1841 07/21/18 0022 07/22/18 1027  WBC 20.1* 16.1* 13.7*    Microbiology Recent Results (from the past 240 hour(s))  SARS Coronavirus 2 (CEPHEID - Performed in [place] hospital lab), Hosp Order     Status: None   Collection Time: 07/20/18  7:34 PM  Result Value Ref Range Status   SARS Coronavirus 2 NEGATIVE NEGATIVE Final    Comment: (NOTE) If result is NEGATIVE SARS-CoV-2 target nucleic acids are NOT DETECTED. The SARS-CoV-2 RNA is generally detectable in upper and lower  respiratory specimens during the acute phase of infection. The lowest  concentration of SARS-CoV-2 viral copies this assay can detect is 250  copies / mL. A negative result does not preclude SARS-CoV-2 infection  and should not be used as the sole basis for treatment or other  patient management decisions.  A negative result may occur with  improper specimen collection / handling, submission of specimen other  than nasopharyngeal swab, presence of viral mutation(s) within the  areas targeted by this assay, and inadequate number of viral copies  (<250 copies / mL). A negative result must be combined with clinical  observations, patient history, and epidemiological information. If result is POSITIVE SARS-CoV-2 target nucleic acids are DETECTED. The SARS-CoV-2 RNA is generally detectable in upper and lower  respiratory specimens dur  ing the acute phase of infection.  Positive  results are indicative of active infection with SARS-CoV-2.  Clinical  correlation with patient history and other diagnostic information is  necessary to determine patient infection status.  Positive results do  not rule out bacterial infection or co-infection with other viruses. If result is PRESUMPTIVE POSTIVE SARS-CoV-2 nucleic acids MAY BE PRESENT.   A presumptive positive result was obtained on the submitted specimen  and confirmed on repeat testing.  While 2019 novel coronavirus  (SARS-CoV-2) nucleic acids may be present in the submitted sample  additional confirmatory testing may be necessary for epidemiological  and / or clinical management purposes  to differentiate between  SARS-CoV-2 and other Sarbecovirus currently known to infect humans.  If clinically indicated additional testing with an alternate test  methodology [phone]) is advised. The SARS-CoV-2 RNA is generally  detectable in upper and  lower respiratory sp ecimens during the acute  phase of infection. The expected result is Negative. Fact Sheet for Patients:  StrictlyIdeas.no Fact Sheet for Healthcare Providers: BankingDealers.co.za This test is not yet approved or cleared by the [place] FDA and has been authorized for detection and/or diagnosis of SARS-CoV-2 by FDA under an Emergency Use Authorization (EUA).  This EUA will remain in effect (meaning this test can be used) for the duration of the COVID-19 declaration under Section 564(b)(1) of the Act, 21 U.S.C. section 360bbb-3(b)(1), unless the authorization is terminated or revoked sooner. Performed at [facility], [address] [place], [place] [address]     Procedures and diagnostic studies:  Ct Head Wo Contrast  Result Date: 07/21/2018 CLINICAL DATA:  Intracranial hemorrhage follow up EXAM: CT HEAD WITHOUT CONTRAST TECHNIQUE: Contiguous axial images  were obtained from the base of the skull through the vertex without intravenous contrast. COMPARISON:  Head CT 07/20/2018 FINDINGS: Brain: Unchanged distribution of intraventricular blood within the lateral ventricles. No new site of hemorrhage. No midline shift or other mass effect. There is periventricular hypoattenuation compatible with chronic microvascular disease. Size and configuration of the ventricles are unchanged. Vascular: Atherosclerotic calcification of the internal carotid arteries at the skull base. No abnormal hyperdensity of the major intracranial arteries or dural venous sinuses. Skull: The visualized skull base, calvarium and extracranial soft tissues are normal. Sinuses/Orbits: No fluid levels or advanced mucosal thickening of the visualized paranasal sinuses. No mastoid or middle ear effusion. The orbits are normal. IMPRESSION: Unchanged intraventricular blood.  No new site of hemorrhage. Electronically Signed   By: Ulyses Jarred M.D.   On: 07/21/2018 06:58   Ct Head Wo Contrast  Result Date: 07/20/2018 CLINICAL DATA:  78 year old and recent fall.  Decreased hearing. EXAM: CT HEAD WITHOUT CONTRAST TECHNIQUE: Contiguous axial images were obtained from the base of the skull through the vertex without intravenous contrast. COMPARISON:  Brain MRI 05/29/2014 FINDINGS: Brain: Intraventricular hemorrhage in the posterior aspect of the lateral ventricles, left side greater than right. Largest amount of blood is probably involving the left choroid plexus and could represent a shear injury. Dilatation of the lateral ventricles has mildly progressed since 2016. In addition, there is concern for a small amount of subarachnoid hemorrhage in the left frontal lobe on sequence 3, image 15. Low density in the periventricular and subcortical white matter is similar to the previous MRI findings. There is no evidence for midline shift, mass lesion or large infarct. Pituitary gland is mildly prominent but  similar to the previous MRI findings. Vascular: No hyperdense vessel or unexpected calcification. Skull: Normal. Negative for fracture or focal lesion. Sinuses/Orbits: Mucosal disease in the ethmoid air cells. Probable mucosal disease in the posterior right sphenoid sinus. Other: None IMPRESSION: 1. Intraventricular hemorrhage involving bilateral lateral ventricles, left side greater than right. Findings could be related to a shear injury involving the left choroid plexus. There is also concern for a small amount of subarachnoid blood in the left frontal lobe. 2. Dilatation of the lateral ventricles has mildly progressed since 2016. No evidence for obstructive hydrocephalus. Critical Value/emergent results were called by telephone at the time of interpretation on 07/20/2018 at 6:30 pm to Dr. Davonna Belling , who verbally acknowledged these results. Electronically Signed   By: Markus Daft M.D.   On: 07/20/2018 18:34   Dg Chest Portable 1 View  Result Date: 07/20/2018 CLINICAL DATA:  Chest pain. EXAM: PORTABLE CHEST 1 VIEW COMPARISON:  CT chest dated  Jul 09, 2018. Chest x-ray dated Jul 08, 2018. FINDINGS: The heart size and mediastinal contours are within normal limits. Prior CABG. Atherosclerotic calcification of the aortic arch. Normal pulmonary vascularity. Persistent low lung volumes with mild bibasilar atelectasis. No focal consolidation, pleural effusion, or pneumothorax. No acute osseous abnormality. IMPRESSION: 1. Persistent low lung volumes with mild bibasilar atelectasis. Electronically Signed   By: Titus Dubin M.D.   On: 07/20/2018 18:02    Medications:   . amLODipine  10 mg Oral Daily  . atorvastatin  20 mg Oral QHS  . bisacodyl  5 mg Oral Q72H  . insulin aspart  0-15 Units Subcutaneous TID WC  . insulin glargine  18 Units Subcutaneous QHS  . lisinopril  20 mg Oral Daily  . Melatonin  3 mg Oral QHS  . multivitamin with minerals  1 tablet Oral Daily  . pantoprazole  40 mg Oral Daily  .  ranolazine  500 mg Oral BID  . sucralfate  1 g Oral TID AC & HS  . tamsulosin  0.4 mg Oral QPC supper   Continuous Infusions:   LOS: 1 day   Geradine Girt  [facility]   How to contact the [facility] Attending or Consulting provider [place] or covering provider during after hours [place], for this patient?  1. Check the care team in [facility] and look for a) attending/consulting TRH provider listed and b) the [facility] team listed 2. Log into [email or web address] and use [place]'s universal password to access. If you do not have the password, please contact the hospital operator. 3. Locate the [facility] provider you are looking for under [facility] and page to a number that you can be directly reached. 4. If you still have difficulty reaching the provider, please page the [facility] (Director on Call) for the Hospitalists listed on amion for assistance.  07/22/2018, 1:32 PM

## 2018-07-22 NOTE — Progress Notes (Addendum)
Inpatient Diabetes Program Recommendations  AACE/ADA: New Consensus Statement on Inpatient Glycemic Control (2015)  Target Ranges:  Prepandial:   less than 140 mg/dL      Peak postprandial:   less than 180 mg/dL (1-2 hours)      Critically ill patients:  140 - 180 mg/dL   Lab Results  Component Value Date   GLUCAP 202 (H) 07/22/2018   HGBA1C 7.9 (H) 07/21/2018    Review of Glycemic Control Results for Ricardo, Le (MRN [id number]) as of 07/22/2018 13:19  Ref. Range 07/21/2018 20:22 07/22/2018 07:44 07/22/2018 11:43  Glucose-Capillary Latest Ref Range: 70 - 99 mg/dL 197 (H) 202 (H) 202 (H)   Diabetes history: type 2 DM Outpatient Diabetes medications: Glipizide 10 mg BID, Lantus 15 units QHS Current orders for Inpatient glycemic control: Lantus 15 units QHS, Novolog 0-15 units TID  Inpatient Diabetes Program Recommendations:    Consider slightly increasing Lantus to 18 units QHS and changing diet to carb modified.   Thanks, Bronson Curb, MSN, RNC-OB Diabetes Coordinator [phone] (8a-5p)

## 2018-07-22 NOTE — Progress Notes (Signed)
WIFE CALLED AND PT IS TALKING TO HER  ON THE PHONE.  WIFE STATES THAT PT TAKES 3 MG OF MELATONIN AT [place], and maybe that would help him sleep better.

## 2018-07-22 NOTE — Progress Notes (Signed)
Rehab Admissions Coordinator Note:  Patient was screened by Michel Santee for appropriateness for an Inpatient Acute Rehab Consult.  At this time, we are recommending Inpatient Rehab consult.  Note pt with new IV hemorrhage and worsening confusion, but may not have needed assist at d/c.  Will contact MD for order to further assess.   Michel Santee 07/22/2018, 11:15 AM  I can be reached at [phone].

## 2018-07-22 NOTE — Progress Notes (Signed)
PT  LITTLE MORE CONFUSED AND WANTS TO CALL DAUGHTER. EXPLAINED TO PT THAT ITS 0200 AND WE COULD CALL HER IN THE MORNING.  PT STATES HE CAN'T SLEEP TONIGHT BECAUSE HE SLEPT ALL  DAY YESTERDAY.  USES URINAL WHEN REMINDED TO.

## 2018-07-23 ENCOUNTER — Inpatient Hospital Stay (HOSPITAL_COMMUNITY): Payer: PPO

## 2018-07-23 ENCOUNTER — Encounter (HOSPITAL_COMMUNITY): Payer: Self-pay

## 2018-07-23 ENCOUNTER — Inpatient Hospital Stay (HOSPITAL_COMMUNITY)
Admission: RE | Admit: 2018-07-23 | Discharge: 2018-07-30 | DRG: 946 | Disposition: A | Discharge status: Home Health | Payer: PPO | Source: Intra-hospital | Attending: Physical Medicine & Rehabilitation | Admitting: Physical Medicine & Rehabilitation

## 2018-07-23 ENCOUNTER — Other Ambulatory Visit: Payer: Self-pay

## 2018-07-23 DIAGNOSIS — L89611 Pressure ulcer of right heel, stage 1: Secondary | ICD-10-CM | POA: Diagnosis present

## 2018-07-23 DIAGNOSIS — Z794 Long term (current) use of insulin: Secondary | ICD-10-CM

## 2018-07-23 DIAGNOSIS — S06309A Unspecified focal traumatic brain injury with loss of consciousness of unspecified duration, initial encounter: Secondary | ICD-10-CM

## 2018-07-23 DIAGNOSIS — I10 Essential (primary) hypertension: Secondary | ICD-10-CM | POA: Diagnosis present

## 2018-07-23 DIAGNOSIS — E7800 Pure hypercholesterolemia, unspecified: Secondary | ICD-10-CM | POA: Diagnosis not present

## 2018-07-23 DIAGNOSIS — W19XXXD Unspecified fall, subsequent encounter: Secondary | ICD-10-CM | POA: Diagnosis not present

## 2018-07-23 DIAGNOSIS — S32402D Unspecified fracture of left acetabulum, subsequent encounter for fracture with routine healing: Secondary | ICD-10-CM | POA: Diagnosis not present

## 2018-07-23 DIAGNOSIS — Z951 Presence of aortocoronary bypass graft: Secondary | ICD-10-CM

## 2018-07-23 DIAGNOSIS — I2510 Atherosclerotic heart disease of native coronary artery without angina pectoris: Secondary | ICD-10-CM | POA: Diagnosis not present

## 2018-07-23 DIAGNOSIS — N400 Enlarged prostate without lower urinary tract symptoms: Secondary | ICD-10-CM | POA: Diagnosis not present

## 2018-07-23 DIAGNOSIS — S0630AA Unspecified focal traumatic brain injury with loss of consciousness status unknown, initial encounter: Secondary | ICD-10-CM

## 2018-07-23 DIAGNOSIS — Z9081 Acquired absence of spleen: Secondary | ICD-10-CM | POA: Diagnosis not present

## 2018-07-23 DIAGNOSIS — K219 Gastro-esophageal reflux disease without esophagitis: Secondary | ICD-10-CM | POA: Diagnosis present

## 2018-07-23 DIAGNOSIS — E785 Hyperlipidemia, unspecified: Secondary | ICD-10-CM | POA: Diagnosis not present

## 2018-07-23 DIAGNOSIS — E1169 Type 2 diabetes mellitus with other specified complication: Secondary | ICD-10-CM | POA: Diagnosis not present

## 2018-07-23 DIAGNOSIS — E1151 Type 2 diabetes mellitus with diabetic peripheral angiopathy without gangrene: Secondary | ICD-10-CM | POA: Diagnosis present

## 2018-07-23 DIAGNOSIS — S06359D Traumatic hemorrhage of left cerebrum with loss of consciousness of unspecified duration, subsequent encounter: Secondary | ICD-10-CM | POA: Diagnosis not present

## 2018-07-23 DIAGNOSIS — R4189 Other symptoms and signs involving cognitive functions and awareness: Secondary | ICD-10-CM | POA: Diagnosis present

## 2018-07-23 DIAGNOSIS — L89152 Pressure ulcer of sacral region, stage 2: Secondary | ICD-10-CM | POA: Diagnosis present

## 2018-07-23 DIAGNOSIS — K5900 Constipation, unspecified: Secondary | ICD-10-CM | POA: Diagnosis present

## 2018-07-23 DIAGNOSIS — H9190 Unspecified hearing loss, unspecified ear: Secondary | ICD-10-CM | POA: Diagnosis not present

## 2018-07-23 DIAGNOSIS — S06349D Traumatic hemorrhage of right cerebrum with loss of consciousness of unspecified duration, subsequent encounter: Secondary | ICD-10-CM

## 2018-07-23 DIAGNOSIS — Z8249 Family history of ischemic heart disease and other diseases of the circulatory system: Secondary | ICD-10-CM

## 2018-07-23 DIAGNOSIS — L89621 Pressure ulcer of left heel, stage 1: Secondary | ICD-10-CM | POA: Diagnosis present

## 2018-07-23 DIAGNOSIS — S32402A Unspecified fracture of left acetabulum, initial encounter for closed fracture: Secondary | ICD-10-CM | POA: Diagnosis not present

## 2018-07-23 DIAGNOSIS — I2583 Coronary atherosclerosis due to lipid rich plaque: Secondary | ICD-10-CM | POA: Diagnosis not present

## 2018-07-23 DIAGNOSIS — Z833 Family history of diabetes mellitus: Secondary | ICD-10-CM

## 2018-07-23 DIAGNOSIS — E1122 Type 2 diabetes mellitus with diabetic chronic kidney disease: Secondary | ICD-10-CM | POA: Diagnosis not present

## 2018-07-23 LAB — BASIC METABOLIC PANEL
Anion gap: 12 (ref 5–15)
BUN: 14 mg/dL (ref 8–23)
CO2: 24 mmol/L (ref 22–32)
Calcium: 9.1 mg/dL (ref 8.9–10.3)
Chloride: 95 mmol/L — ABNORMAL LOW (ref 98–111)
Creatinine, Ser: 0.91 mg/dL (ref 0.61–1.24)
GFR calc Af Amer: >60 mL/min (ref >60)
GFR calc non Af Amer: >60 mL/min (ref >60)
Glucose, Bld: 133 mg/dL — ABNORMAL HIGH (ref 70–99)
Potassium: 3.7 mmol/L (ref 3.5–5.1)
Sodium: 131 mmol/L — ABNORMAL LOW (ref 135–145)

## 2018-07-23 LAB — GLUCOSE, CAPILLARY
Glucose-Capillary: 129 mg/dL — ABNORMAL HIGH (ref 70–99)
Glucose-Capillary: 124 mg/dL — ABNORMAL HIGH (ref 70–99)
Glucose-Capillary: 136 mg/dL — ABNORMAL HIGH (ref 70–99)
Glucose-Capillary: 124 mg/dL — ABNORMAL HIGH (ref 70–99)

## 2018-07-23 LAB — CBC
HCT: 40.8 % (ref 39.0–52.0)
Hemoglobin: 14.4 g/dL (ref 13.0–17.0)
MCH: 32.1 pg (ref 26.0–34.0)
MCHC: 35.3 g/dL (ref 30.0–36.0)
MCV: 90.9 fL (ref 80.0–100.0)
Platelets: 342 10*3/uL (ref 150–400)
RBC: 4.49 MIL/uL (ref 4.22–5.81)
RDW: 11.9 % (ref 11.5–15.5)
WBC: 11.5 10*3/uL — ABNORMAL HIGH (ref 4.0–10.5)
nRBC: 0 % (ref 0.0–0.2)

## 2018-07-23 LAB — VITAMIN B12: Vitamin B-12: 616 pg/mL (ref 180–914)

## 2018-07-23 MED ORDER — SUCRALFATE 1 GM/10ML PO SUSP
1.0000 g | ORAL | Status: DC
  Administered 2018-07-23: 1 g via ORAL
  Filled 2018-07-23 (×2): qty 10

## 2018-07-23 MED ORDER — INSULIN ASPART 100 UNIT/ML ~~LOC~~ SOLN
0.0000 [IU] | Freq: Three times a day (TID) | SUBCUTANEOUS | Status: DC
  Administered 2018-07-24: 3 [IU] via SUBCUTANEOUS
  Administered 2018-07-24: 1 [IU] via SUBCUTANEOUS
  Administered 2018-07-25: 18:00:00 5 [IU] via SUBCUTANEOUS
  Administered 2018-07-25: 09:00:00 2 [IU] via SUBCUTANEOUS
  Administered 2018-07-25: 13:00:00 3 [IU] via SUBCUTANEOUS
  Administered 2018-07-26: 2 [IU] via SUBCUTANEOUS
  Administered 2018-07-26: 11 [IU] via SUBCUTANEOUS
  Administered 2018-07-26: 3 [IU] via SUBCUTANEOUS
  Administered 2018-07-27: 8 [IU] via SUBCUTANEOUS
  Administered 2018-07-27: 3 [IU] via SUBCUTANEOUS
  Administered 2018-07-27: 5 [IU] via SUBCUTANEOUS
  Administered 2018-07-28: 11 [IU] via SUBCUTANEOUS
  Administered 2018-07-28: 1 [IU] via SUBCUTANEOUS
  Administered 2018-07-28 – 2018-07-29 (×2): 2 [IU] via SUBCUTANEOUS
  Administered 2018-07-29: 8 [IU] via SUBCUTANEOUS
  Administered 2018-07-30: 2 [IU] via SUBCUTANEOUS

## 2018-07-23 MED ORDER — RANOLAZINE ER 500 MG PO TB12
500.0000 mg | ORAL_TABLET | Freq: Two times a day (BID) | ORAL | Status: DC
  Administered 2018-07-23 – 2018-07-25 (×4): 500 mg via ORAL
  Filled 2018-07-23 (×7): qty 1

## 2018-07-23 MED ORDER — INSULIN GLARGINE 100 UNIT/ML ~~LOC~~ SOLN: 18.0000 [IU] | Freq: Every day | SUBCUTANEOUS | 11 refills | Status: DC

## 2018-07-23 MED ORDER — MELATONIN 3 MG PO TABS: 3.0000 mg | ORAL_TABLET | Freq: Every day | ORAL | 0 refills | Status: DC

## 2018-07-23 MED ORDER — LISINOPRIL 20 MG PO TABS
20.0000 mg | ORAL_TABLET | Freq: Every day | ORAL | Status: DC
  Administered 2018-07-24 – 2018-07-30 (×7): 20 mg via ORAL
  Filled 2018-07-23 (×7): qty 1

## 2018-07-23 MED ORDER — SENNA 8.6 MG PO TABS: 1.0000 | ORAL_TABLET | Freq: Every evening | ORAL | 0 refills | Status: DC | PRN

## 2018-07-23 MED ORDER — ATORVASTATIN CALCIUM 10 MG PO TABS
20.0000 mg | ORAL_TABLET | Freq: Every day | ORAL | Status: DC
  Administered 2018-07-23 – 2018-07-29 (×7): 20 mg via ORAL
  Filled 2018-07-23 (×7): qty 2

## 2018-07-23 MED ORDER — FENTANYL CITRATE (PF) 100 MCG/2ML IJ SOLN
6.2500 ug | Freq: Once | INTRAMUSCULAR | Status: AC
  Administered 2018-07-23: 6.5 ug via INTRAVENOUS
  Filled 2018-07-23: qty 2

## 2018-07-23 MED ORDER — GABAPENTIN 100 MG PO CAPS
100.0000 mg | ORAL_CAPSULE | Freq: Two times a day (BID) | ORAL | Status: DC | PRN
  Administered 2018-07-25 – 2018-07-26 (×2): 100 mg via ORAL
  Filled 2018-07-23 (×2): qty 1

## 2018-07-23 MED ORDER — SORBITOL 70 % SOLN
30.0000 mL | Freq: Every day | Status: DC | PRN
  Administered 2018-07-23 – 2018-07-28 (×4): 30 mL via ORAL
  Filled 2018-07-23 (×4): qty 30

## 2018-07-23 MED ORDER — ACETAMINOPHEN 325 MG PO TABS
650.0000 mg | ORAL_TABLET | Freq: Four times a day (QID) | ORAL | Status: DC | PRN
  Administered 2018-07-24 – 2018-07-30 (×19): 650 mg via ORAL
  Filled 2018-07-23 (×17): qty 2

## 2018-07-23 MED ORDER — PANTOPRAZOLE SODIUM 40 MG PO TBEC
40.0000 mg | DELAYED_RELEASE_TABLET | Freq: Every day | ORAL | Status: DC
  Administered 2018-07-24 – 2018-07-30 (×7): 40 mg via ORAL
  Filled 2018-07-23 (×7): qty 1

## 2018-07-23 MED ORDER — TAMSULOSIN HCL 0.4 MG PO CAPS
0.4000 mg | ORAL_CAPSULE | Freq: Every day | ORAL | Status: DC
  Administered 2018-07-23 – 2018-07-29 (×7): 0.4 mg via ORAL
  Filled 2018-07-23 (×7): qty 1

## 2018-07-23 MED ORDER — BISACODYL 10 MG RE SUPP
10.0000 mg | Freq: Every day | RECTAL | Status: DC | PRN
  Administered 2018-07-23: 10 mg via RECTAL
  Filled 2018-07-23: qty 1

## 2018-07-23 MED ORDER — ENSURE ENLIVE PO LIQD
237.0000 mL | Freq: Two times a day (BID) | ORAL | Status: DC
  Administered 2018-07-24 – 2018-07-30 (×13): 237 mL via ORAL

## 2018-07-23 MED ORDER — LISINOPRIL 20 MG PO TABS: 20.0000 mg | ORAL_TABLET | Freq: Every day | ORAL | Status: DC

## 2018-07-23 MED ORDER — AMLODIPINE BESYLATE 10 MG PO TABS
10.0000 mg | ORAL_TABLET | Freq: Every day | ORAL | Status: DC
  Administered 2018-07-24 – 2018-07-30 (×7): 10 mg via ORAL
  Filled 2018-07-23 (×7): qty 1

## 2018-07-23 MED ORDER — INSULIN ASPART 100 UNIT/ML ~~LOC~~ SOLN: 0.0000 [IU] | Freq: Three times a day (TID) | SUBCUTANEOUS | 11 refills | Status: DC

## 2018-07-23 MED ORDER — MELATONIN 3 MG PO TABS
3.0000 mg | ORAL_TABLET | Freq: Every day | ORAL | Status: DC
  Administered 2018-07-23 – 2018-07-29 (×7): 3 mg via ORAL
  Filled 2018-07-23 (×8): qty 1

## 2018-07-23 MED ORDER — BISACODYL 5 MG PO TBEC
5.0000 mg | DELAYED_RELEASE_TABLET | ORAL | Status: DC
  Administered 2018-07-24: 5 mg via ORAL
  Filled 2018-07-23: qty 1

## 2018-07-23 MED ORDER — SENNA 8.6 MG PO TABS
1.0000 | ORAL_TABLET | Freq: Every evening | ORAL | Status: DC | PRN
  Administered 2018-07-24: 8.6 mg via ORAL
  Filled 2018-07-23: qty 1

## 2018-07-23 MED ORDER — NITROGLYCERIN 0.4 MG SL SUBL: 0.4000 mg | SUBLINGUAL_TABLET | SUBLINGUAL | Status: DC | PRN

## 2018-07-23 MED ORDER — INSULIN GLARGINE 100 UNIT/ML ~~LOC~~ SOLN
18.0000 [IU] | Freq: Every day | SUBCUTANEOUS | Status: DC
  Administered 2018-07-23 – 2018-07-29 (×7): 18 [IU] via SUBCUTANEOUS
  Filled 2018-07-23 (×9): qty 0.18

## 2018-07-23 MED ORDER — ADULT MULTIVITAMIN W/MINERALS CH
1.0000 | ORAL_TABLET | Freq: Every day | ORAL | Status: DC
  Administered 2018-07-24 – 2018-07-30 (×7): 1 via ORAL
  Filled 2018-07-23 (×7): qty 1

## 2018-07-23 NOTE — PMR Pre-admission (Signed)
PMR Admission Coordinator Pre-Admission Assessment  Patient: Ricardo Le is an 78 y.o., male MRN: [id number] DOB: 01/10/1941 Height: '5\' 5"'$  (165.1 cm) Weight: 67 kg  Insurance Information HMO:     PPO: yes     PCP:      IPA:      80/20:      OTHER: medicare advantage plan PRIMARY: health Team advantage      Policy#: [id number]      Subscriber: pt CM Name: Liam Rogers      Phone#: [phone]     Fax#: Epic access Pre-Cert#: [id number] for 7 days; initial denial which was overturned on peer to peer appeal    Employer:  Benefits:  Phone #: [phone]     Name: 6/9 Eff. Date: 02/13/2018     Deduct: none      Out of Pocket Max: $3400      Life Max: none CIR: $295 co pay per day days 1 until 6      SNF: $20 co pay per day days 1 until 20; $160 co pay per day days 21 until 100 Outpatient: $15 co pay per visit      Co-Pay: visits per medical neccesity Home Health: 100%      Co-Pay: visits per medical neccesity DME: 80%     Co-Pay: 20% Providers: in network  SECONDARY: none       Medicaid Application Date:       Case Manager:  Disability Application Date:       Case Worker:   The "Data Collection Information Summary" for patients in Inpatient Rehabilitation Facilities with attached "Privacy Act [place] Records" was provided and verbally reviewed with: Family  Emergency Contact Information Contact Information    Name Relation Home Work Mobile   Phung,Betty Spouse [phone]  [phone]   Belva Agee [phone]        Current Medical History  Patient Admitting Diagnosis: ICH . Acetabular fx  History of Present Illness:   :  78 year old right-handed male with history of diabetes mellitus, CAD/CABG 2009, hypertension.  Patient with recent admission 07/08/2018 to 07/11/2018 after recent fall sustaining a left acetabular fracture that was non operative followed by Dr. Edmonia Lynch orthopedic services touchdown weightbearing.  Also completed CT  angiogram of chest that was negative for pulmonary emboli.  He was discharged home 07/11/2018 ambulating minimal guard using rolling walker maintaining weightbearing precautions.  Presented 07/20/2018 with headache and altered mental status.  No additional falls noted since most recent event.  Cranial CT scan showed intraventricular hemorrhage involving bilateral lateral ventricles, left side greater than right.  There was also concern for small amount of subarachnoid blood in the left frontal lobe.  Neurosurgery Dr. Sherley Bounds consulted with conservative care with follow-up cranial CT scan unchanged no new site of hemorrhage.  No hydrocephalus and no mass-effect.  Patient had been on aspirin prior to admission remained on hold due to hemorrhage.  Noted gradual hearing loss post fall recommendations have been made for ENT follow-up as outpatient.  Patient with persistent confusion family had been cleared to stay with patient.  Cranial CT scan completed 07/23/2018 showing no new acute changes.  Tolerating a regular consistency diet.  Weight bearing restrictions progressed to 50 % WB for two weeks then full weight bearing. Patient with history of stomach issues and with nausea and head ache on day of admission.    Patient's medical record from [facility]  has been reviewed by the rehabilitation admission coordinator  and physician.  Past Medical History  Past Medical History:  Diagnosis Date  . CAD (coronary artery disease)    a. 04/2007 s/p CABG x 4  - LIMA->LAD, VG->OM1->Om2, VG->PDA;  b. 12/2008 & 06/2010 Caths - Native multivessel dzs with 4/4 patent grafts. c. cath 03/09/2015 patent grafts, unchanged from prior cath.  . Cancer [facility])    pancreatic  March 2011  . Carotid bruit    a. 07/2010 U/S- 0-39% bilat ICA stenosis  . Chest pain    Noncardiac probably related to reflux  . DDD (degenerative disc disease)    several surgeries  . Fracture acetabulum-closed ([place]) 07/08/2018  . GERD  (gastroesophageal reflux disease)   . HNP (herniated nucleus pulposus), lumbar    a. L2-3, s/p laminotomy, microdiskectomy 02/2009  . HOH (hard of hearing)    uses amplifiers   . Nephrolithiasis   . PAD (peripheral artery disease) ([place]) 04/2014   s/p L Common Iliac Stent  . Palpitations   . 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 Pneumonia    hx of  pna  . Pure hypercholesterolemia    statin intolerance  . Type II or unspecified type diabetes mellitus without mention of complication, not stated as uncontrolled    insulin dependent  . Unspecified essential hypertension     Family History   family history includes Coronary artery disease in his brother and sister; Diabetes in his brother; Heart attack in his father; Other in his mother and another family member.  Prior Rehab/Hospitalizations Has the patient had prior rehab or hospitalizations prior to admission? Yes  Has the patient had major surgery during 100 days prior to admission? Yes   Current Medications  Current Facility-Administered Medications:  .  acetaminophen (TYLENOL) tablet 650 mg, 650 mg, Oral, Q6H PRN, Norins, Heinz Knuckles, MD, 650 mg at 07/23/18 1559 .  amLODipine (NORVASC) tablet 10 mg, 10 mg, Oral, Daily, Norins, Heinz Knuckles, MD, 10 mg at 07/23/18 0917 .  atorvastatin (LIPITOR) tablet 20 mg, 20 mg, Oral, QHS, Norins, Heinz Knuckles, MD, 20 mg at 07/22/18 2214 .  bisacodyl (DULCOLAX) EC tablet 5 mg, 5 mg, Oral, Q72H, Norins, Heinz Knuckles, MD, 5 mg at 07/21/18 1059 .  bisacodyl (DULCOLAX) suppository 10 mg, 10 mg, Rectal, Daily PRN, Vann, Jessica U, DO .  gabapentin (NEURONTIN) capsule 100 mg, 100 mg, Oral, BID PRN, Norins, Heinz Knuckles, MD, 100 mg at 07/22/18 2241 .  insulin aspart (novoLOG) injection 0-15 Units, 0-15 Units, Subcutaneous, TID WC, Norins, Heinz Knuckles, MD, 2 Units at 07/23/18 1600 .  insulin glargine (LANTUS) injection 18 Units, 18 Units, Subcutaneous, QHS, Geradine Girt,  DO, 18 Units at 07/22/18 2214 .  lisinopril (ZESTRIL) tablet 20 mg, 20 mg, Oral, Daily, 20 mg at 07/23/18 0916 **AND** [DISCONTINUED] hydrochlorothiazide (MICROZIDE) capsule 12.5 mg, 12.5 mg, Oral, Daily, Norins, Heinz Knuckles, MD .  Melatonin TABS 3 mg, 3 mg, Oral, QHS, Vann, Jessica U, DO, 3 mg at 07/22/18 2214 .  multivitamin with minerals tablet 1 tablet, 1 tablet, Oral, Daily, Norins, Heinz Knuckles, MD, 1 tablet at 07/23/18 0916 .  nitroGLYCERIN (NITROSTAT) SL tablet 0.4 mg, 0.4 mg, Sublingual, Q5 min PRN, Norins, Heinz Knuckles, MD .  ondansetron [facility]) injection 4 mg, 4 mg, Intravenous, Q6H PRN, Bodenheimer, Charles A, NP, 4 mg at 07/23/18 1559 .  pantoprazole (PROTONIX) EC tablet 40 mg, 40 mg, Oral, Daily, Norins, Heinz Knuckles, MD, 40 mg at 07/23/18 0916 .  ranolazine (RANEXA) 12 hr tablet 500 mg, 500 mg, Oral, BID, Norins, Heinz Knuckles, MD, 500 mg at 07/23/18 0917 .  senna (SENOKOT) tablet 8.6 mg, 1 tablet, Oral, QHS PRN, Eulogio Bear U, DO, 8.6 mg at 07/22/18 2241 .  sodium chloride flush (NS) 0.9 % injection 3 mL, 3 mL, Intravenous, PRN, Norins, Heinz Knuckles, MD .  sucralfate (CARAFATE) 1 GM/10ML suspension 1 g, 1 g, Oral, 4 times per day, Vann, Jessica U, DO, 1 g at 07/23/18 1426 .  tamsulosin (FLOMAX) capsule 0.4 mg, 0.4 mg, Oral, QPC supper, Norins, Heinz Knuckles, MD, 0.4 mg at 07/22/18 1650  Patients Current Diet:  Diet Order            Diet - low sodium heart healthy        Diet Carb Modified        Diet Carb Modified Fluid consistency: Thin; Room service appropriate? Yes with Assist  Diet effective now              Precautions / Restrictions Precautions Precautions: Fall Restrictions Weight Bearing Restrictions: No LLE Weight Bearing: Touchdown weight bearing   Has the patient had 2 or more falls or a fall with injury in the past year? Yes  Prior Activity Level Limited Community (1-2x/wk): limited since acetabular fracture  Prior Functional Level Self Care: Did the patient need help  bathing, dressing, using the toilet or eating? Needed some help  Indoor Mobility: Did the patient need assistance with walking from room to room (with or without device)? Needed some help  Stairs: Did the patient need assistance with internal or external stairs (with or without device)? Needed some help  Functional Cognition: Did the patient need help planning regular tasks such as shopping or remembering to take medications? Needed some help  Home Assistive Devices / [place] Devices/Equipment: None Home Equipment: Bedside commode, Environmental consultant - 2 wheels, Shower seat - built in, [employer] - tub/shower, Hand held shower head  Prior Device Use: Indicate devices/aids used by the patient prior to current illness, exacerbation or injury? Walker  Current Functional Level Cognition  Overall Cognitive Status: Impaired/Different from baseline Current Attention Level: Sustained Orientation Level: Oriented to person, Oriented to place, Oriented to time, Oriented to situation Following Commands: Follows one step commands consistently, Follows one step commands with increased time Safety/Judgement: Decreased awareness of safety, Decreased awareness of deficits General Comments: difficult due to pt being extremely HOH    Extremity Assessment (includes Sensation/Coordination)  Upper Extremity Assessment: Overall WFL for tasks assessed(grossly 4/5, no ROM limitations)  Lower Extremity Assessment: Defer to PT evaluation LLE Deficits / Details: decreased strength and ROM due to pain LLE: Unable to fully assess due to pain    ADLs  Overall ADL's : Needs assistance/impaired Eating/Feeding: Set up, Sitting Grooming: Set up, Sitting, Wash/dry hands, Wash/dry face, Oral care Grooming Details (indicate cue type and reason): not able to maintain standing position, needs sitting- okay with automatic tasks, some cueing due to cognition for sequencing Upper Body Bathing: Set up, Sitting Lower  Body Bathing: Moderate assistance, Sitting/lateral leans Lower Body Bathing Details (indicate cue type and reason): assist for LLE Lower Body Dressing: Moderate assistance, Sit to/from stand Lower Body Dressing Details (indicate cue type and reason): requires assist for LLE, can access RLE Toilet Transfer: Moderate assistance, +2 for physical assistance, +2 for safety/equipment, Cueing for safety, Cueing for sequencing, BSC, RW Toileting- Clothing Manipulation and Hygiene: Sit to/from stand, Maximal assistance Tub/ Shower Transfer: Walk-in shower,  Moderate assistance, Ambulation, Rolling walker, +2 for physical assistance, +2 for safety/equipment Functional mobility during ADLs: Moderate assistance, +2 for safety/equipment, Rolling walker, Cueing for safety, Cueing for sequencing General ADL Comments: pt very HOH and with increased confusion this date; able to tolerate stand pivot with mod +2- not consistently following WB    Mobility  Overal bed mobility: Needs Assistance Bed Mobility: Supine to Sit Supine to sit: Mod assist General bed mobility comments: heavy mod to EOB, needing assist to pull to sit and for bed pad to right hips    Transfers  Overall transfer level: Needs assistance Equipment used: Rolling walker (2 wheeled) Transfers: Sit to/from Stand Sit to Stand: Mod assist, +2 physical assistance, From elevated surface Stand pivot transfers: Mod assist, +2 physical assistance General transfer comment: consistent VC's needed for WB status, pt difficulty maintaining (suspect HOH and cog)    Ambulation / Gait / Stairs / Wheelchair Mobility  Ambulation/Gait General Gait Details: Unable to progress to gait training    Posture / Balance Dynamic Sitting Balance Sitting balance - Comments: Posterior lean, needing both hands to pull for support Balance Overall balance assessment: History of Falls, Needs assistance Sitting-balance support: No upper extremity supported, Feet  supported Sitting balance-Leahy Scale: Poor Sitting balance - Comments: Posterior lean, needing both hands to pull for support Standing balance support: Bilateral upper extremity supported Standing balance-Leahy Scale: Poor Standing balance comment: reliant on BUE support    Special needs/care consideration BiPAP/CPAP  N/a CPM  N/a Continuous Drip IV  N/a Dialysis n/a Life Vest  N/a Oxygen  N/a Special Bed  Trach Size n/a Wound Vac n/a Skin   Bowel mgmt: LBM 6/6 having nausea; some chronic bowel issues with a specific bowel regimen Bladder mgmt:  Required I and O cath 6/9 due to retention issues Diabetic mgmt: yes Behavioral consideration  Daughter , Lynelle Smoke, began staying with pt 6/8 due to he is very HOH and very confused. Difficulty with employees wearing masks and shield due to COVID universal recommendations Chemo/radiation  N/a Very HOH   Previous Home Environment  Living Arrangements: Engineer, water, [name])  Lives With: Spouse, Daughter Available Help at Discharge: Family, Available 24 hours/day Type of Home: House Home Layout: One level Home Access: Stairs to enter Entrance Stairs-Rails: None Technical brewer of Steps: 1 Bathroom Shower/Tub: Multimedia programmer: Programmer, systems: Yes How Accessible: Accessible via walker [place]: Yes Type of Home Care Services: Home PT, [place] (if known): [facility]  Discharge Living Setting Plans for Discharge Living Setting: Patient's home, Lives with (comment)(spouse and daughter) Type of Home at Discharge: House Discharge Home Layout: One level Discharge Home Access: Stairs to enter Entrance Stairs-Rails: None Entrance Stairs-Number of Steps: 1 Discharge Bathroom Shower/Tub: Horticulturist, commercial: Standard Discharge Bathroom Accessibility: Yes How Accessible: Accessible via walker Does the patient have any problems obtaining  your medications?: No  Social/Family/Support Systems Patient Roles: Spouse, Parent Contact Information: daughter [name] at bedside and wife via phone Anticipated Caregiver: wife, daughter and son Anticipated Caregiver's Contact Information: see above Ability/Limitations of Caregiver: none Caregiver Availability: 24/7 Discharge Plan Discussed with Primary Caregiver: Yes Is Caregiver In Agreement with Plan?: Yes Does Caregiver/Family have Issues with Lodging/Transportation while Pt is in Rehab?: No  Goals/Additional Needs Patient/Family Goal for Rehab: supervision to min asisst PT, OT, and SLP Expected length of stay: ELOS 10 to 12 days Equipment Needs: VERY HOH Additional Information: daughter at bedside due to his confusion  and hearing issues Pt/Family Agrees to Admission and willing to participate: Yes Program Orientation Provided & Reviewed with Pt/Caregiver Including Roles  & Responsibilities: Yes  Decrease burden of Care through IP rehab admission: n/a  Possible need for SNF placement upon discharge: not anticipated  Patient Condition: I have reviewed medical records from [facility] , spoken with patient, spouse, son and daughter. I met with patient at the bedside for inpatient rehabilitation assessment.  Patient will benefit from ongoing PT, OT and SLP, can actively participate in 3 hours of therapy a day 5 days of the week, and can make measurable gains during the admission.  Patient will also benefit from the coordinated team approach during an Inpatient Acute Rehabilitation admission.  The patient will receive intensive therapy as well as Rehabilitation physician, nursing, social worker, and care management interventions.  Due to bladder management, bowel management, safety, skin/wound care, disease management, medication administration, pain management and patient education the patient requires 24 hour a day rehabilitation nursing.  The patient is currently mod to max assist  with mobility and basic ADLs.  Discharge setting and therapy post discharge at home with home health is anticipated.  Patient has agreed to participate in the Acute Inpatient Rehabilitation Program and will admit today.  Preadmission Screen Completed By:  Cleatrice Burke RN MSN, 07/23/2018 4:08 PM ______________________________________________________________________   Discussed status with Dr. Naaman Plummer  on  07/23/2018 at 1628 and received approval for admission today.  Admission Coordinator:  Cleatrice Burke, RN MSN, time 0063 Date  07/23/2018   Assessment/Plan: Diagnosis: left acetab fx, SAH/IVH ?traumatic 1. Does the need for close, 24 hr/day Medical supervision in concert with the patient's rehab needs make it unreasonable for this patient to be served in a less intensive setting? Yes 2. Co-Morbidities requiring supervision/potential complications: CAD, DM, HTN 3. Due to bladder management, bowel management, safety, skin/wound care, disease management, medication administration, pain management and patient education, does the patient require 24 hr/day rehab nursing? Yes 4. Does the patient require coordinated care of a physician, rehab nurse, PT (1-2 hrs/day, 5 days/week), OT (1-2 hrs/day, 5 days/week) and SLP (1-2 hrs/day, 5 days/week) to address physical and functional deficits in the context of the above medical diagnosis(es)? Yes Addressing deficits in the following areas: balance, endurance, locomotion, strength, transferring, bowel/bladder control, bathing, dressing, feeding, grooming, toileting, cognition and psychosocial support 5. Can the patient actively participate in an intensive therapy program of at least 3 hrs of therapy 5 days a week? Yes 6. The potential for patient to make measurable gains while on inpatient rehab is excellent 7. Anticipated functional outcomes upon discharge from inpatients are: supervision and min assist PT, supervision and min assist OT, supervision  and min assist SLP 8. Estimated rehab length of stay to reach the above functional goals is: 10-12 days 9. Anticipated D/C setting: Home 10. Anticipated post D/C treatments: [place] therapy 11. Overall Rehab/Functional Prognosis: excellent  MD Signature: Meredith Staggers, MD, [facility] 07/23/2018

## 2018-07-23 NOTE — Progress Notes (Addendum)
Progress Note    Ricardo Le  XNA:[id number] DOB: Jun 18, 1940  DOA: 07/20/2018 PCP: Angelina Sheriff, MD    Brief Narrative:     Medical records reviewed and are as summarized below:  Ricardo Le is an 78 y.o. male with medical history significant of insulin-dependent diabetes, known coronary artery disease with stable angina hypertension, hyperlipidemia.  Patient fell approximately 2 weeks ago sustaining a acetabular fracture.  Was hospitalized from May 25 to May 28.   Patient's family noted that he was increasingly confused, complaining of a headache, and generally weak and for the symptoms he was brought to the emergency department for evaluation.  He was found to have IVH.  Needs placement- ? CIR?  Assessment/Plan:   Active Problems:   Type 2 diabetes mellitus with vascular disease (HCC)   Elevated lipids   Essential hypertension   Chest pain   Intraventricular hemorrhage (HCC)   Intraventricular hemorrhage -repeat head CT stable -neurosurgery consult: blood appears to be acute and not likely from prior fall, small amount of intraventricular blood layering posteriorly in the occipital horns.  There is no hydrocephalus and no mass-effect.  He does not need acute neurosurgical intervention.  -holding ASA -PT/OT-CIR  Gradual hearing loss post fall -outpatient ENT follow up -no cerum impaction on exam -I suspect that some of his noted confusion is related to his hearing and the fact caregivers are wearing masks plus shields further muffling our voices  HA -probably post concussive plus bleed -needs close monitoring by MD-- think CIR would be best option in case of urgent neurosurgical intervention -repeat head CT stable   Diabetes type 2- controlled --patient is on glipizide and basal insulin therapy at home.  Last hemoglobin A1c in April was 7.3% - Lantus 18 units nightly. -Glipizide will be held -SSI   Hypertension   -resume home meds  Acetabular  fracture  - TDWB LLE -outpatient follow up with Dr. Percell Miller   Patient is not safe to go home.  Prior admission, it was recommended he go to CIR--will place consult  Family Communication/Anticipated D/C date and plan/Code Status   DVT prophylaxis: scd Code Status: Full Code.  Family Communication: spoke with wife 6/8 and daughter 6/9 Disposition Plan: CIR?   Medical Consultants:    NS  CIR  Subjective:   Daughter at bedside, c/o stomach discomfort   Objective:    Vitals:   07/22/18 1957 07/22/18 2348 07/23/18 0314 07/23/18 0732  BP: 135/64 133/66 122/61 (!) 153/66  Pulse: 73 75 66 68  Resp:  15 18 20   Temp: 98.9 F (37.2 C) 99.3 F (37.4 C) 98 F (36.7 C) 98.2 F (36.8 C)  TempSrc: Oral Oral Oral Oral  SpO2: 95% 96% 95% 97%  Weight:      Height:        Intake/Output Summary (Last 24 hours) at 07/23/2018 0919 Last data filed at 07/23/2018 5427 Gross per 24 hour  Intake 120 ml  Output 325 ml  Net -205 ml   Filed Weights   07/21/18 0116  Weight: 67 kg    Exam: In bed, pleasant Hard of hearing rrr No increase work of breathing +BS, soft, NT Moves all 4 ext  Data Reviewed:   I have personally reviewed following labs and imaging studies:  Labs: Labs show the following:   Basic Metabolic Panel: Recent Labs  Lab 07/20/18 1841 07/21/18 0022 07/22/18 1027 07/23/18 0333  NA 130* 132* 131* 131*  K 3.7  3.8 3.7 3.7  CL 91* 92* 93* 95*  CO2 24 27 26 24   GLUCOSE 330* 304* 149* 133*  BUN 20 17 12 14   CREATININE 1.01 0.94 0.84 0.91  CALCIUM 9.1 9.2 9.4 9.1   GFR Estimated Creatinine Clearance: 60.1 mL/min (by C-G formula based on SCr of 0.91 mg/dL). Liver Function Tests: Recent Labs  Lab 07/20/18 1841  AST 46*  ALT 71*  ALKPHOS 129*  BILITOT 1.1  PROT 7.4  ALBUMIN 3.3*   No results for input(s): LIPASE, AMYLASE in the last 168 hours. No results for input(s): AMMONIA in the last 168 hours. Coagulation profile No results for input(s):  INR, PROTIME in the last 168 hours.  CBC: Recent Labs  Lab 07/20/18 1841 07/21/18 0022 07/22/18 1027 07/23/18 0333  WBC 20.1* 16.1* 13.7* 11.5*  NEUTROABS 17.6*  --   --   --   HGB 14.7 13.9 14.8 14.4  HCT 42.6 40.0 41.5 40.8  MCV 91.6 90.1 88.9 90.9  PLT 328 317 360 342   Cardiac Enzymes: Recent Labs  Lab 07/20/18 1841  TROPONINI <0.03   BNP (last 3 results) Recent Labs    07/04/18 1311  PROBNP 137   CBG: Recent Labs  Lab 07/22/18 0744 07/22/18 1143 07/22/18 1632 07/22/18 2127 07/23/18 0607  GLUCAP 202* 202* 106* 153* 129*   D-Dimer: No results for input(s): DDIMER in the last 72 hours. Hgb A1c: Recent Labs    07/21/18 0022  HGBA1C 7.9*   Lipid Profile: No results for input(s): CHOL, HDL, LDLCALC, TRIG, CHOLHDL, LDLDIRECT in the last 72 hours. Thyroid function studies: No results for input(s): TSH, T4TOTAL, T3FREE, THYROIDAB in the last 72 hours.  Invalid input(s): FREET3 Anemia work up: Recent Labs    07/23/18 [place] 616   Sepsis Labs: Recent Labs  Lab 07/20/18 1841 07/21/18 0022 07/22/18 1027 07/23/18 0333  WBC 20.1* 16.1* 13.7* 11.5*    Microbiology Recent Results (from the past 240 hour(s))  SARS Coronavirus 2 (CEPHEID - Performed in [place] hospital lab), Hosp Order     Status: None   Collection Time: 07/20/18  7:34 PM  Result Value Ref Range Status   SARS Coronavirus 2 NEGATIVE NEGATIVE Final    Comment: (NOTE) If result is NEGATIVE SARS-CoV-2 target nucleic acids are NOT DETECTED. The SARS-CoV-2 RNA is generally detectable in upper and lower  respiratory specimens during the acute phase of infection. The lowest  concentration of SARS-CoV-2 viral copies this assay can detect is 250  copies / mL. A negative result does not preclude SARS-CoV-2 infection  and should not be used as the sole basis for treatment or other  patient management decisions.  A negative result may occur with  improper specimen collection /  handling, submission of specimen other  than nasopharyngeal swab, presence of viral mutation(s) within the  areas targeted by this assay, and inadequate number of viral copies  (<250 copies / mL). A negative result must be combined with clinical  observations, patient history, and epidemiological information. If result is POSITIVE SARS-CoV-2 target nucleic acids are DETECTED. The SARS-CoV-2 RNA is generally detectable in upper and lower  respiratory specimens dur ing the acute phase of infection.  Positive  results are indicative of active infection with SARS-CoV-2.  Clinical  correlation with patient history and other diagnostic information is  necessary to determine patient infection status.  Positive results do  not rule out bacterial infection or co-infection with other viruses. If result is PRESUMPTIVE POSTIVE  SARS-CoV-2 nucleic acids MAY BE PRESENT.   A presumptive positive result was obtained on the submitted specimen  and confirmed on repeat testing.  While 2019 novel coronavirus  (SARS-CoV-2) nucleic acids may be present in the submitted sample  additional confirmatory testing may be necessary for epidemiological  and / or clinical management purposes  to differentiate between  SARS-CoV-2 and other Sarbecovirus currently known to infect humans.  If clinically indicated additional testing with an alternate test  methodology [phone]) is advised. The SARS-CoV-2 RNA is generally  detectable in upper and lower respiratory sp ecimens during the acute  phase of infection. The expected result is Negative. Fact Sheet for Patients:  StrictlyIdeas.no Fact Sheet for Healthcare Providers: BankingDealers.co.za This test is not yet approved or cleared by the [place] FDA and has been authorized for detection and/or diagnosis of SARS-CoV-2 by FDA under an Emergency Use Authorization (EUA).  This EUA will remain in effect (meaning this  test can be used) for the duration of the COVID-19 declaration under Section 564(b)(1) of the Act, 21 U.S.C. section 360bbb-3(b)(1), unless the authorization is terminated or revoked sooner. Performed at [facility], [place] [address], [place], [place] [address]     Procedures and diagnostic studies:  No results found.  Medications:   . amLODipine  10 mg Oral Daily  . atorvastatin  20 mg Oral QHS  . bisacodyl  5 mg Oral Q72H  . insulin aspart  0-15 Units Subcutaneous TID WC  . insulin glargine  18 Units Subcutaneous QHS  . lisinopril  20 mg Oral Daily  . Melatonin  3 mg Oral QHS  . multivitamin with minerals  1 tablet Oral Daily  . pantoprazole  40 mg Oral Daily  . ranolazine  500 mg Oral BID  . sucralfate  1 g Oral TID AC & HS  . tamsulosin  0.4 mg Oral QPC supper   Continuous Infusions:   LOS: 2 days   Geradine Girt  [facility]   How to contact the [facility] Attending or Consulting provider [place] or covering provider during after hours [place], for this patient?  1. Check the care team in [facility] and look for a) attending/consulting TRH provider listed and b) the [facility] team listed 2. Log into [email or web address] and use [place]'s universal password to access. If you do not have the password, please contact the hospital operator. 3. Locate the [facility] provider you are looking for under [facility] and page to a number that you can be directly reached. 4. If you still have difficulty reaching the provider, please page the [facility] (Director on Call) for the Hospitalists listed on amion for assistance.  07/23/2018, 9:19 AM

## 2018-07-23 NOTE — Discharge Summary (Signed)
Physician Discharge Summary  Ricardo Le VFI:[id number] DOB: 12/19/1940 DOA: 07/20/2018  PCP: Angelina Sheriff, MD  Admit date: 07/20/2018 Discharge date: 07/23/2018  Admitted From: home Discharge disposition: CIR   Recommendations for Outpatient Follow-Up:   Needs bowel regimen for severe constipation 50% weight bearing for 2 weeks then progress to full weight bearing.   Discharge Diagnosis:   Active Problems:   Type 2 diabetes mellitus with vascular disease (HCC)   Elevated lipids   Essential hypertension   Chest pain   Intraventricular hemorrhage ([place])    Discharge Condition: Improved.  Diet recommendation: Low sodium, heart healthy.  Carbohydrate-modified  Wound care: None.  Code status: Full.   History of Present Illness:   Ricardo Le is a 78 y.o. male with medical history significant of insulin-dependent diabetes, known coronary artery disease with stable angina hypertension, hyperlipidemia.  Patient fell approximately 2 weeks ago sustaining a acetabular fracture.  Was hospitalized from May 25 to May 28.  Pubic intervention was needed.  Home for physical therapy and touchdown weightbearing.  He does not report having an additional fall.  Patient's family noted that he was increasingly confused, complaining of a headache, and generally weak and for the symptoms he was brought to the emergency department for evaluation.   Hospital Course by Problem:   Intraventricular hemorrhage -repeat head CT stable -neurosurgery consult: blood appears to be acute and not likely from prior fall, small amount of intraventricular blood layering posteriorly in the occipital horns. There is no hydrocephalus and no mass-effect. He does not need acute neurosurgical intervention.  -holding ASA -PT/OT-CIR  Gradual hearing loss post fall -outpatient ENT follow up -no cerum impaction on exam -I suspect that some of his noted confusion is related to his hearing and  the fact caregivers are wearing masks plus shields further muffling our voices  HA -probably post concussive plus bleed -needs close monitoring by MD -repeat head CT stable -did well with small dose of fentanyl (6.38mcg)  Diabetestype 2- controlled --patient is on glipizide and basal insulin therapy at home. Last hemoglobin A1c in April was 7.3% - Lantus 18 units nightly. -Glipizide will be held -SSI  Hypertension  -resume home meds  Acetabular fracture  -outpatient follow up with Dr. Percell Miller -see above for weight bearing status      Medical Consultants:   CIR NS orthopedics   Discharge Exam:   Vitals:   07/23/18 1131 07/23/18 1538  BP: (!) 148/67 (!) 162/68  Pulse: 84 84  Resp: 20 18  Temp: 97.8 F (36.6 C) 99 F (37.2 C)  SpO2: 98% 96%   Vitals:   07/23/18 0314 07/23/18 0732 07/23/18 1131 07/23/18 1538  BP: 122/61 (!) 153/66 (!) 148/67 (!) 162/68  Pulse: 66 68 84 84  Resp: 18 20 20 18   Temp: 98 F (36.7 C) 98.2 F (36.8 C) 97.8 F (36.6 C) 99 F (37.2 C)  TempSrc: Oral Oral Oral Oral  SpO2: 95% 97% 98% 96%  Weight:      Height:           The results of significant diagnostics from this hospitalization (including imaging, microbiology, ancillary and laboratory) are listed below for reference.     Procedures and Diagnostic Studies:   Ct Head Wo Contrast  Result Date: 07/21/2018 CLINICAL DATA:  Intracranial hemorrhage follow up EXAM: CT HEAD WITHOUT CONTRAST TECHNIQUE: Contiguous axial images were obtained from the base of the skull through the vertex without intravenous  contrast. COMPARISON:  Head CT 07/20/2018 FINDINGS: Brain: Unchanged distribution of intraventricular blood within the lateral ventricles. No new site of hemorrhage. No midline shift or other mass effect. There is periventricular hypoattenuation compatible with chronic microvascular disease. Size and configuration of the ventricles are unchanged. Vascular: Atherosclerotic  calcification of the internal carotid arteries at the skull base. No abnormal hyperdensity of the major intracranial arteries or dural venous sinuses. Skull: The visualized skull base, calvarium and extracranial soft tissues are normal. Sinuses/Orbits: No fluid levels or advanced mucosal thickening of the visualized paranasal sinuses. No mastoid or middle ear effusion. The orbits are normal. IMPRESSION: Unchanged intraventricular blood.  No new site of hemorrhage. Electronically Signed   By: Ulyses Jarred M.D.   On: 07/21/2018 06:58   Ct Head Wo Contrast  Result Date: 07/20/2018 CLINICAL DATA:  78 year old and recent fall.  Decreased hearing. EXAM: CT HEAD WITHOUT CONTRAST TECHNIQUE: Contiguous axial images were obtained from the base of the skull through the vertex without intravenous contrast. COMPARISON:  Brain MRI 05/29/2014 FINDINGS: Brain: Intraventricular hemorrhage in the posterior aspect of the lateral ventricles, left side greater than right. Largest amount of blood is probably involving the left choroid plexus and could represent a shear injury. Dilatation of the lateral ventricles has mildly progressed since 2016. In addition, there is concern for a small amount of subarachnoid hemorrhage in the left frontal lobe on sequence 3, image 15. Low density in the periventricular and subcortical white matter is similar to the previous MRI findings. There is no evidence for midline shift, mass lesion or large infarct. Pituitary gland is mildly prominent but similar to the previous MRI findings. Vascular: No hyperdense vessel or unexpected calcification. Skull: Normal. Negative for fracture or focal lesion. Sinuses/Orbits: Mucosal disease in the ethmoid air cells. Probable mucosal disease in the posterior right sphenoid sinus. Other: None IMPRESSION: 1. Intraventricular hemorrhage involving bilateral lateral ventricles, left side greater than right. Findings could be related to a shear injury involving the left  choroid plexus. There is also concern for a small amount of subarachnoid blood in the left frontal lobe. 2. Dilatation of the lateral ventricles has mildly progressed since 2016. No evidence for obstructive hydrocephalus. Critical Value/emergent results were called by telephone at the time of interpretation on 07/20/2018 at 6:30 pm to Dr. Davonna Belling , who verbally acknowledged these results. Electronically Signed   By: Markus Daft M.D.   On: 07/20/2018 18:34   Dg Chest Portable 1 View  Result Date: 07/20/2018 CLINICAL DATA:  Chest pain. EXAM: PORTABLE CHEST 1 VIEW COMPARISON:  CT chest dated Jul 09, 2018. Chest x-ray dated Jul 08, 2018. FINDINGS: The heart size and mediastinal contours are within normal limits. Prior CABG. Atherosclerotic calcification of the aortic arch. Normal pulmonary vascularity. Persistent low lung volumes with mild bibasilar atelectasis. No focal consolidation, pleural effusion, or pneumothorax. No acute osseous abnormality. IMPRESSION: 1. Persistent low lung volumes with mild bibasilar atelectasis. Electronically Signed   By: Titus Dubin M.D.   On: 07/20/2018 18:02     Labs:   Basic Metabolic Panel: Recent Labs  Lab 07/20/18 1841 07/21/18 0022 07/22/18 1027 07/23/18 0333  NA 130* 132* 131* 131*  K 3.7 3.8 3.7 3.7  CL 91* 92* 93* 95*  CO2 24 27 26 24   GLUCOSE 330* 304* 149* 133*  BUN 20 17 12 14   CREATININE 1.01 0.94 0.84 0.91  CALCIUM 9.1 9.2 9.4 9.1   GFR Estimated Creatinine Clearance: 60.1 mL/min (by C-G formula based  on SCr of 0.91 mg/dL). Liver Function Tests: Recent Labs  Lab 07/20/18 1841  AST 46*  ALT 71*  ALKPHOS 129*  BILITOT 1.1  PROT 7.4  ALBUMIN 3.3*   No results for input(s): LIPASE, AMYLASE in the last 168 hours. No results for input(s): AMMONIA in the last 168 hours. Coagulation profile No results for input(s): INR, PROTIME in the last 168 hours.  CBC: Recent Labs  Lab 07/20/18 1841 07/21/18 0022 07/22/18 1027  07/23/18 0333  WBC 20.1* 16.1* 13.7* 11.5*  NEUTROABS 17.6*  --   --   --   HGB 14.7 13.9 14.8 14.4  HCT 42.6 40.0 41.5 40.8  MCV 91.6 90.1 88.9 90.9  PLT 328 317 360 342   Cardiac Enzymes: Recent Labs  Lab 07/20/18 1841  TROPONINI <0.03   BNP: Invalid input(s): POCBNP CBG: Recent Labs  Lab 07/22/18 1632 07/22/18 2127 07/23/18 0607 07/23/18 1133 07/23/18 1540  GLUCAP 106* 153* 129* 124* 136*   D-Dimer No results for input(s): DDIMER in the last 72 hours. Hgb A1c Recent Labs    07/21/18 0022  HGBA1C 7.9*   Lipid Profile No results for input(s): CHOL, HDL, LDLCALC, TRIG, CHOLHDL, LDLDIRECT in the last 72 hours. Thyroid function studies No results for input(s): TSH, T4TOTAL, T3FREE, THYROIDAB in the last 72 hours.  Invalid input(s): FREET3 Anemia work up Recent Labs    07/23/18 [place]   Microbiology Recent Results (from the past 240 hour(s))  SARS Coronavirus 2 (CEPHEID - Performed in [place] hospital lab), Hosp Order     Status: None   Collection Time: 07/20/18  7:34 PM  Result Value Ref Range Status   SARS Coronavirus 2 NEGATIVE NEGATIVE Final    Comment: (NOTE) If result is NEGATIVE SARS-CoV-2 target nucleic acids are NOT DETECTED. The SARS-CoV-2 RNA is generally detectable in upper and lower  respiratory specimens during the acute phase of infection. The lowest  concentration of SARS-CoV-2 viral copies this assay can detect is 250  copies / mL. A negative result does not preclude SARS-CoV-2 infection  and should not be used as the sole basis for treatment or other  patient management decisions.  A negative result may occur with  improper specimen collection / handling, submission of specimen other  than nasopharyngeal swab, presence of viral mutation(s) within the  areas targeted by this assay, and inadequate number of viral copies  (<250 copies / mL). A negative result must be combined with clinical  observations, patient history,  and epidemiological information. If result is POSITIVE SARS-CoV-2 target nucleic acids are DETECTED. The SARS-CoV-2 RNA is generally detectable in upper and lower  respiratory specimens dur ing the acute phase of infection.  Positive  results are indicative of active infection with SARS-CoV-2.  Clinical  correlation with patient history and other diagnostic information is  necessary to determine patient infection status.  Positive results do  not rule out bacterial infection or co-infection with other viruses. If result is PRESUMPTIVE POSTIVE SARS-CoV-2 nucleic acids MAY BE PRESENT.   A presumptive positive result was obtained on the submitted specimen  and confirmed on repeat testing.  While 2019 novel coronavirus  (SARS-CoV-2) nucleic acids may be present in the submitted sample  additional confirmatory testing may be necessary for epidemiological  and / or clinical management purposes  to differentiate between  SARS-CoV-2 and other Sarbecovirus currently known to infect humans.  If clinically indicated additional testing with an alternate test  methodology [phone]) is advised. The  SARS-CoV-2 RNA is generally  detectable in upper and lower respiratory sp ecimens during the acute  phase of infection. The expected result is Negative. Fact Sheet for Patients:  StrictlyIdeas.no Fact Sheet for Healthcare Providers: BankingDealers.co.za This test is not yet approved or cleared by the [place] FDA and has been authorized for detection and/or diagnosis of SARS-CoV-2 by FDA under an Emergency Use Authorization (EUA).  This EUA will remain in effect (meaning this test can be used) for the duration of the COVID-19 declaration under Section 564(b)(1) of the Act, 21 U.S.C. section 360bbb-3(b)(1), unless the authorization is terminated or revoked sooner. Performed at [facility], [place] [address], [place], [place] [address]       Discharge Instructions:   Discharge Instructions    Diet - low sodium heart healthy   Complete by:  As directed    Diet Carb Modified   Complete by:  As directed    Increase activity slowly   Complete by:  As directed      Allergies as of 07/23/2018      Reactions   Oxycodone Hcl Nausea And Vomiting   Rosuvastatin Calcium Other (See Comments)   Myalgias   Tramadol Other (See Comments)   Makes patient "spaced out and dizzy"   Iohexol Hives   Patient is ok with premedication (Benadryl 50 mg by mouth); Onset Date: 12/03/2007   Statins Other (See Comments)   Myalgias- can tolerate Atorvastatin      Medication List    STOP taking these medications   aspirin EC 81 MG tablet   glipiZIDE 10 MG tablet Commonly known as:  GLUCOTROL   lisinopril-hydrochlorothiazide 20-12.5 MG tablet Commonly known as:  ZESTORETIC   polyethylene glycol 17 g packet Commonly known as:  MIRALAX / GLYCOLAX   ranolazine 500 MG 12 hr tablet Commonly known as:  Ranexa     TAKE these medications   acetaminophen 325 MG tablet Commonly known as:  TYLENOL Take 650 mg by mouth every 6 (six) hours as needed (pain). What changed:  Another medication with the same name was removed. Continue taking this medication, and follow the directions you see here.   amLODipine 10 MG tablet Commonly known as:  NORVASC Take 10 mg by mouth daily.   atorvastatin 20 MG tablet Commonly known as:  LIPITOR Take 20 mg by mouth at bedtime.   docusate sodium 100 MG capsule Commonly known as:  COLACE Take 1 capsule (100 mg total) by mouth 2 (two) times daily.   Dulcolax 5 MG EC tablet Generic drug:  bisacodyl Take 5 mg by mouth every 3 (three) days.   esomeprazole 40 MG capsule Commonly known as:  NEXIUM Take 40 mg by mouth 2 (two) times daily before a meal.   gabapentin 100 MG capsule Commonly known as:  NEURONTIN Take 100 mg by mouth 2 (two) times daily as needed (for nerve pain).   insulin aspart 100  UNIT/ML injection Commonly known as:  novoLOG Inject 0-15 Units into the skin 3 (three) times daily with meals.   insulin glargine 100 UNIT/ML injection Commonly known as:  LANTUS Inject 0.18 mLs (18 Units total) into the skin at bedtime. What changed:  how much to take   lisinopril 20 MG tablet Commonly known as:  ZESTRIL Take 1 tablet (20 mg total) by mouth daily. Start taking on:  July 24, 2018   Melatonin 3 MG Tabs Take 1 tablet (3 mg total) by mouth at bedtime.   multivitamin  with minerals Tabs tablet Take 1 tablet by mouth daily.   nitroGLYCERIN 0.4 MG SL tablet Commonly known as:  NITROSTAT Place 1 tablet (0.4 mg total) under the tongue every 5 (five) minutes as needed for chest pain.   senna 8.6 MG Tabs tablet Commonly known as:  SENOKOT Take 1 tablet (8.6 mg total) by mouth at bedtime as needed for mild constipation.   sucralfate 1 GM/10ML suspension Commonly known as:  CARAFATE Take 1 g by mouth 4 (four) times daily.   tamsulosin 0.4 MG Caps capsule Commonly known as:  FLOMAX Take 0.4 mg by mouth daily after supper.      Follow-up Information    Angelina Sheriff, MD Follow up in 1 week(s).   Specialty:  Family Medicine Contact information: [place] [address] [phone]        Josue Hector, MD .   Specialty:  Cardiology Contact information: [phone] [address] [place] [address] [phone]            Time coordinating discharge: 35 min  Signed:  Geradine Girt DO  [facility] 07/23/2018, 3:50 PM

## 2018-07-23 NOTE — Progress Notes (Signed)
Spoke with Ricardo Le from [employer] who is covering for Dr. Percell Miller, Patient may go to 50% weight bearing for 2 weeks then progress to full weight bearing. Eulogio Bear DO

## 2018-07-23 NOTE — Progress Notes (Signed)
Meredith Staggers, MD  Physician  Physical Medicine and Rehabilitation  PMR Pre-admission  Signed  Date of Service:  07/23/2018 4:08 PM       Related encounter: ED to Hosp-Admission (Current) from 07/20/2018 in [facility]      Signed         Show:Clear all _0 Manual_1 Template_2 Copied  Added by: _3 Cristina Gong, RN_4 Meredith Staggers, MD  _5 Hover for details PMR Admission Coordinator Pre-Admission Assessment  Patient: Ricardo Le is an 78 y.o., male MRN: [id number] DOB: [birth year] Height: _6  (165.1 cm) Weight: 67 kg  Insurance Information HMO:     PPO: yes     PCP:      IPA:      80/20:      OTHER: medicare advantage plan PRIMARY: health Team advantage      Policy#: [id number]      Subscriber: pt CM Name: Liam Rogers      Phone#: [phone]     Fax#: Epic access Pre-Cert#: [id number] for 7 days; initial denial which was overturned on peer to peer appeal    Employer:  Benefits:  Phone #: [phone]     Name: 6/9 Eff. Date: 02/13/2018     Deduct: none      Out of Pocket Max: $3400      Life Max: none CIR: $295 co pay per day days 1 until 6      SNF: $20 co pay per day days 1 until 20; $160 co pay per day days 21 until 100 Outpatient: $15 co pay per visit      Co-Pay: visits per medical neccesity Home Health: 100%      Co-Pay: visits per medical neccesity DME: 80%     Co-Pay: 20% Providers: in network  SECONDARY: none       Medicaid Application Date:       Case Manager:  Disability Application Date:       Case Worker:   The "Data Collection Information Summary" for patients in Inpatient Rehabilitation Facilities with attached "Privacy Act [place] Records" was provided and verbally reviewed with: Family  Emergency Contact Information         Contact Information    Name Relation Home Work Mobile   Scavone,Betty Spouse [phone]  [phone]   Belva Agee [phone]        Current  Medical History  Patient Admitting Diagnosis: ICH . Acetabular fx  History of Present Illness:   :  78 year old right-handed male with history of diabetes mellitus, CAD/CABG 2009, hypertension. Patient with recent admission 07/08/2018 to 07/11/2018 after recent fall sustaining a left acetabular fracture that was non operative followed by Dr. Edmonia Lynch orthopedic services touchdown weightbearing. Also completed CT angiogram of chest that was negative for pulmonary emboli. He was discharged home 07/11/2018 ambulating minimal guard using rolling walker maintaining weightbearing precautions. Presented 07/20/2018 with headache and altered mental status. No additional falls noted since most recent event. Cranial CT scan showed intraventricular hemorrhage involving bilateral lateral ventricles, left side greater than right. There was also concern for small amount of subarachnoid blood in the left frontal lobe. Neurosurgery Dr. Sherley Bounds consulted with conservative care with follow-up cranial CT scan unchanged no new site of hemorrhage. No hydrocephalus and no mass-effect. Patient had been on aspirin prior to admission remained on hold due to hemorrhage. Noted gradual hearing loss post fall recommendations have been made for ENT follow-up as outpatient. Patient with persistent confusion family had been cleared to  stay with patient. Cranial CT scan completed 07/23/2018 showing no newacute changes. Tolerating a regular consistency diet. Weight bearing restrictions progressed to 50 % WB for two weeks then full weight bearing. Patient with history of stomach issues and with nausea and head ache on day of admission.  Patient's medical record from [facility]  has been reviewed by the rehabilitation admission coordinator and physician.  Past Medical History      Past Medical History:  Diagnosis Date  . CAD (coronary artery disease)    a. 04/2007 s/p CABG x 4  - LIMA->LAD, VG->OM1->Om2,  VG->PDA;  b. 12/2008 & 06/2010 Caths - Native multivessel dzs with 4/4 patent grafts. c. cath 03/09/2015 patent grafts, unchanged from prior cath.  . Cancer [facility])    pancreatic  March 2011  . Carotid bruit    a. 07/2010 U/S- 0-39% bilat ICA stenosis  . Chest pain    Noncardiac probably related to reflux  . DDD (degenerative disc disease)    several surgeries  . Fracture acetabulum-closed ([place]) 07/08/2018  . GERD (gastroesophageal reflux disease)   . HNP (herniated nucleus pulposus), lumbar    a. L2-3, s/p laminotomy, microdiskectomy 02/2009  . HOH (hard of hearing)    uses amplifiers   . Nephrolithiasis   . PAD (peripheral artery disease) ([place]) 04/2014   s/p L Common Iliac Stent  . Palpitations   . 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 Pneumonia    hx of  pna  . Pure hypercholesterolemia    statin intolerance  . Type II or unspecified type diabetes mellitus without mention of complication, not stated as uncontrolled    insulin dependent  . Unspecified essential hypertension     Family History   family history includes Coronary artery disease in his brother and sister; Diabetes in his brother; Heart attack in his father; Other in his mother and another family member.  Prior Rehab/Hospitalizations Has the patient had prior rehab or hospitalizations prior to admission? Yes  Has the patient had major surgery during 100 days prior to admission? Yes             Current Medications  Current Facility-Administered Medications:  .  acetaminophen (TYLENOL) tablet 650 mg, 650 mg, Oral, Q6H PRN, Norins, Heinz Knuckles, MD, 650 mg at 07/23/18 1559 .  amLODipine (NORVASC) tablet 10 mg, 10 mg, Oral, Daily, Norins, Heinz Knuckles, MD, 10 mg at 07/23/18 0917 .  atorvastatin (LIPITOR) tablet 20 mg, 20 mg, Oral, QHS, Norins, Heinz Knuckles, MD, 20 mg at 07/22/18 2214 .  bisacodyl (DULCOLAX) EC tablet 5 mg, 5 mg, Oral, Q72H, Norins,  Heinz Knuckles, MD, 5 mg at 07/21/18 1059 .  bisacodyl (DULCOLAX) suppository 10 mg, 10 mg, Rectal, Daily PRN, Vann, Jessica U, DO .  gabapentin (NEURONTIN) capsule 100 mg, 100 mg, Oral, BID PRN, Norins, Heinz Knuckles, MD, 100 mg at 07/22/18 2241 .  insulin aspart (novoLOG) injection 0-15 Units, 0-15 Units, Subcutaneous, TID WC, Norins, Heinz Knuckles, MD, 2 Units at 07/23/18 1600 .  insulin glargine (LANTUS) injection 18 Units, 18 Units, Subcutaneous, QHS, Geradine Girt, DO, 18 Units at 07/22/18 2214 .  lisinopril (ZESTRIL) tablet 20 mg, 20 mg, Oral, Daily, 20 mg at 07/23/18 0916 **AND** [DISCONTINUED] hydrochlorothiazide (MICROZIDE) capsule 12.5 mg, 12.5 mg, Oral, Daily, Norins, Heinz Knuckles, MD .  Melatonin TABS 3 mg, 3 mg, Oral, QHS, Vann, Jessica U, DO, 3 mg at 07/22/18 2214 .  multivitamin with minerals tablet 1 tablet, 1 tablet, Oral, Daily, Norins, Heinz Knuckles, MD, 1 tablet at 07/23/18 0916 .  nitroGLYCERIN (NITROSTAT) SL tablet 0.4 mg, 0.4 mg, Sublingual, Q5 min PRN, Norins, Heinz Knuckles, MD .  ondansetron [facility]) injection 4 mg, 4 mg, Intravenous, Q6H PRN, Bodenheimer, Charles A, NP, 4 mg at 07/23/18 1559 .  pantoprazole (PROTONIX) EC tablet 40 mg, 40 mg, Oral, Daily, Norins, Heinz Knuckles, MD, 40 mg at 07/23/18 0916 .  ranolazine (RANEXA) 12 hr tablet 500 mg, 500 mg, Oral, BID, Norins, Heinz Knuckles, MD, 500 mg at 07/23/18 0917 .  senna (SENOKOT) tablet 8.6 mg, 1 tablet, Oral, QHS PRN, Eulogio Bear U, DO, 8.6 mg at 07/22/18 2241 .  sodium chloride flush (NS) 0.9 % injection 3 mL, 3 mL, Intravenous, PRN, Norins, Heinz Knuckles, MD .  sucralfate (CARAFATE) 1 GM/10ML suspension 1 g, 1 g, Oral, 4 times per day, Vann, Jessica U, DO, 1 g at 07/23/18 1426 .  tamsulosin (FLOMAX) capsule 0.4 mg, 0.4 mg, Oral, QPC supper, Norins, Heinz Knuckles, MD, 0.4 mg at 07/22/18 1650  Patients Current Diet:     Diet Order                  Diet - low sodium heart healthy         Diet Carb Modified         Diet Carb Modified  Fluid consistency: Thin; Room service appropriate? Yes with Assist  Diet effective now               Precautions / Restrictions Precautions Precautions: Fall Restrictions Weight Bearing Restrictions: No LLE Weight Bearing: Touchdown weight bearing   Has the patient had 2 or more falls or a fall with injury in the past year? Yes  Prior Activity Level Limited Community (1-2x/wk): limited since acetabular fracture  Prior Functional Level Self Care: Did the patient need help bathing, dressing, using the toilet or eating? Needed some help  Indoor Mobility: Did the patient need assistance with walking from room to room (with or without device)? Needed some help  Stairs: Did the patient need assistance with internal or external stairs (with or without device)? Needed some help  Functional Cognition: Did the patient need help planning regular tasks such as shopping or remembering to take medications? Needed some help  Home Assistive Devices / [place] Devices/Equipment: None Home Equipment: Bedside commode, Environmental consultant - 2 wheels, Shower seat - built in, [employer] - tub/shower, Hand held shower head  Prior Device Use: Indicate devices/aids used by the patient prior to current illness, exacerbation or injury? Walker  Current Functional Level Cognition  Overall Cognitive Status: Impaired/Different from baseline Current Attention Level: Sustained Orientation Level: Oriented to person, Oriented to place, Oriented to time, Oriented to situation Following Commands: Follows one step commands consistently, Follows one step commands with increased time Safety/Judgement: Decreased awareness of safety, Decreased awareness of deficits General Comments: difficult due to pt being extremely HOH    Extremity Assessment (includes Sensation/Coordination)  Upper Extremity Assessment: Overall WFL for tasks assessed(grossly 4/5, no ROM limitations)  Lower Extremity  Assessment: Defer to PT evaluation LLE Deficits / Details: decreased strength and ROM due to pain LLE: Unable to fully assess due to pain    ADLs  Overall ADL's : Needs assistance/impaired Eating/Feeding: Set up, Sitting Grooming: Set up, Sitting, Wash/dry hands, Wash/dry face, Oral care Grooming Details (indicate cue type and reason): not able to maintain standing position, needs sitting- okay  with automatic tasks, some cueing due to cognition for sequencing Upper Body Bathing: Set up, Sitting Lower Body Bathing: Moderate assistance, Sitting/lateral leans Lower Body Bathing Details (indicate cue type and reason): assist for LLE Lower Body Dressing: Moderate assistance, Sit to/from stand Lower Body Dressing Details (indicate cue type and reason): requires assist for LLE, can access RLE Toilet Transfer: Moderate assistance, +2 for physical assistance, +2 for safety/equipment, Cueing for safety, Cueing for sequencing, BSC, RW Toileting- Clothing Manipulation and Hygiene: Sit to/from stand, Maximal assistance Tub/ Shower Transfer: Walk-in shower, Moderate assistance, Ambulation, Rolling walker, +2 for physical assistance, +2 for safety/equipment Functional mobility during ADLs: Moderate assistance, +2 for safety/equipment, Rolling walker, Cueing for safety, Cueing for sequencing General ADL Comments: pt very HOH and with increased confusion this date; able to tolerate stand pivot with mod +2- not consistently following WB    Mobility  Overal bed mobility: Needs Assistance Bed Mobility: Supine to Sit Supine to sit: Mod assist General bed mobility comments: heavy mod to EOB, needing assist to pull to sit and for bed pad to right hips    Transfers  Overall transfer level: Needs assistance Equipment used: Rolling walker (2 wheeled) Transfers: Sit to/from Stand Sit to Stand: Mod assist, +2 physical assistance, From elevated surface Stand pivot transfers: Mod assist, +2 physical  assistance General transfer comment: consistent VC's needed for WB status, pt difficulty maintaining (suspect HOH and cog)    Ambulation / Gait / Stairs / Wheelchair Mobility  Ambulation/Gait General Gait Details: Unable to progress to gait training    Posture / Balance Dynamic Sitting Balance Sitting balance - Comments: Posterior lean, needing both hands to pull for support Balance Overall balance assessment: History of Falls, Needs assistance Sitting-balance support: No upper extremity supported, Feet supported Sitting balance-Leahy Scale: Poor Sitting balance - Comments: Posterior lean, needing both hands to pull for support Standing balance support: Bilateral upper extremity supported Standing balance-Leahy Scale: Poor Standing balance comment: reliant on BUE support    Special needs/care consideration BiPAP/CPAP  N/a CPM  N/a Continuous Drip IV  N/a Dialysis n/a Life Vest  N/a Oxygen  N/a Special Bed  Trach Size n/a Wound Vac n/a Skin   Bowel mgmt: LBM 6/6 having nausea; some chronic bowel issues with a specific bowel regimen Bladder mgmt:  Required I and O cath 6/9 due to retention issues Diabetic mgmt: yes Behavioral consideration  Daughter , Lynelle Smoke, began staying with pt 6/8 due to he is very HOH and very confused. Difficulty with employees wearing masks and shield due to COVID universal recommendations Chemo/radiation  N/a Very HOH   Previous Home Environment  Living Arrangements: Engineer, water, [name])  Lives With: Spouse, Daughter Available Help at Discharge: Family, Available 24 hours/day Type of Home: House Home Layout: One level Home Access: Stairs to enter Entrance Stairs-Rails: None Technical brewer of Steps: 1 Bathroom Shower/Tub: Multimedia programmer: Programmer, systems: Yes How Accessible: Accessible via walker [place]: Yes Type of Home Care Services: Home PT, [place]  (if known): [facility]  Discharge Living Setting Plans for Discharge Living Setting: Patient's home, Lives with (comment)(spouse and daughter) Type of Home at Discharge: House Discharge Home Layout: One level Discharge Home Access: Stairs to enter Entrance Stairs-Rails: None Entrance Stairs-Number of Steps: 1 Discharge Bathroom Shower/Tub: Walk-in shower Discharge Bathroom Toilet: Standard Discharge Bathroom Accessibility: Yes How Accessible: Accessible via walker Does the patient have any problems obtaining your medications?: No  Social/Family/Support Systems Patient Roles: Spouse,  Parent Contact Information: daughter [name] at bedside and wife via phone Anticipated Caregiver: wife, daughter and son Anticipated Caregiver's Contact Information: see above Ability/Limitations of Caregiver: none Caregiver Availability: 24/7 Discharge Plan Discussed with Primary Caregiver: Yes Is Caregiver In Agreement with Plan?: Yes Does Caregiver/Family have Issues with Lodging/Transportation while Pt is in Rehab?: No  Goals/Additional Needs Patient/Family Goal for Rehab: supervision to min asisst PT, OT, and SLP Expected length of stay: ELOS 10 to 12 days Equipment Needs: VERY HOH Additional Information: daughter at bedside due to his confusion and hearing issues Pt/Family Agrees to Admission and willing to participate: Yes Program Orientation Provided & Reviewed with Pt/Caregiver Including Roles  & Responsibilities: Yes  Decrease burden of Care through IP rehab admission: n/a  Possible need for SNF placement upon discharge: not anticipated  Patient Condition: I have reviewed medical records from [facility] , spoken with patient, spouse, son and daughter. I met with patient at the bedside for inpatient rehabilitation assessment.  Patient will benefit from ongoing PT, OT and SLP, can actively participate in 3 hours of therapy a day 5 days of the week, and can make measurable gains  during the admission.  Patient will also benefit from the coordinated team approach during an Inpatient Acute Rehabilitation admission.  The patient will receive intensive therapy as well as Rehabilitation physician, nursing, social worker, and care management interventions.  Due to bladder management, bowel management, safety, skin/wound care, disease management, medication administration, pain management and patient education the patient requires 24 hour a day rehabilitation nursing.  The patient is currently mod to max assist with mobility and basic ADLs.  Discharge setting and therapy post discharge at home with home health is anticipated.  Patient has agreed to participate in the Acute Inpatient Rehabilitation Program and will admit today.  Preadmission Screen Completed By:  Cleatrice Burke RN MSN, 07/23/2018 4:08 PM ______________________________________________________________________   Discussed status with Dr. Naaman Plummer  on  07/23/2018 at 1628 and received approval for admission today.  Admission Coordinator:  Cleatrice Burke, RN MSN, time 1638 Date  07/23/2018   Assessment/Plan: Diagnosis: left acetab fx, SAH/IVH ?traumatic 1. Does the need for close, 24 hr/day Medical supervision in concert with the patient's rehab needs make it unreasonable for this patient to be served in a less intensive setting? Yes 2. Co-Morbidities requiring supervision/potential complications: CAD, DM, HTN 3. Due to bladder management, bowel management, safety, skin/wound care, disease management, medication administration, pain management and patient education, does the patient require 24 hr/day rehab nursing? Yes 4. Does the patient require coordinated care of a physician, rehab nurse, PT (1-2 hrs/day, 5 days/week), OT (1-2 hrs/day, 5 days/week) and SLP (1-2 hrs/day, 5 days/week) to address physical and functional deficits in the context of the above medical diagnosis(es)? Yes Addressing deficits in the  following areas: balance, endurance, locomotion, strength, transferring, bowel/bladder control, bathing, dressing, feeding, grooming, toileting, cognition and psychosocial support 5. Can the patient actively participate in an intensive therapy program of at least 3 hrs of therapy 5 days a week? Yes 6. The potential for patient to make measurable gains while on inpatient rehab is excellent 7. Anticipated functional outcomes upon discharge from inpatients are: supervision and min assist PT, supervision and min assist OT, supervision and min assist SLP 8. Estimated rehab length of stay to reach the above functional goals is: 10-12 days 9. Anticipated D/C setting: Home 10. Anticipated post D/C treatments: [place] therapy 11. Overall Rehab/Functional Prognosis: excellent  MD Signature: Alroy Dust T.  Naaman Plummer, MD, [place] [facility] 07/23/2018         Revision History

## 2018-07-23 NOTE — Progress Notes (Signed)
Inpatient Rehabilitation Admissions Coordinator  Insurance has approved an inpt rehab admit. I met with patient and daughter and they are in agreement. His head is hurting less, but does have some nausea. Patient has history of stomach issues. RN to give Zofran. I have notified RN CM and SW and will make the arrangements to admit today.  Danne Baxter, RN, MSN Rehab Admissions Coordinator [phone] 07/23/2018 4:01 PM

## 2018-07-23 NOTE — Progress Notes (Signed)
Pt was admitted to 4W04. Pt's bed was soaked with urine upon arrival and patient cleaned. Pt alert and responds to commands. Pt left in room with alarm on and bed in lowest position

## 2018-07-23 NOTE — Progress Notes (Deleted)
Telehealth Visit  {Choose 1 Note Type (Telehealth Visit or Telephone Visit):[phone]}   Evaluation Performed:  Follow-up visit  This visit type was conducted due to national recommendations for restrictions regarding the COVID-19 Pandemic (e.g. social distancing).  This format is felt to be most appropriate for this patient at this time.  All issues noted in this document were discussed and addressed.  No physical exam was performed (except for noted visual exam findings with Video Visits).  Please refer to the patient's chart (MyChart message for video visits and phone note for telephone visits) for the patient's consent to telehealth for [facility].  Date:  07/23/2018   ID:  Ricardo Le, DOB 18-Sep-1940, MRN [id number]  Patient Location:  ***  Provider location:   ***  PCP:  Ricardo Sheriff, MD  Cardiologist:  Ricardo Snare Jenkins Rouge, MD  Electrophysiologist:  None   Chief Complaint:  ***  History of Present Illness:    Ricardo Le is a 78 y.o. male who presents via audio/video conferencing for a telehealth visit today.  Seen for ***Seen for Ricardo Le.   He has known CAD with remote CABG in 2009. Cath in January of 2019 for chest pain - grafts patent at that time with small vessel disease - ?microvascular angina. The LIMA to LAD, SVG to OM1/OM2 and SVG to PDA were patent.  Other issues include PAD with prior interventions - followed by Dr. Fletcher Le, HTN, HLD with ? statin intolerance, pancreatic cancer, lumbar spine disease with chronic pain/neuropathy.   He has been unable to take Imdur in the past. He called last month with increasing use of sl NTG - did not wish to go to the ER. Had been placed back on Creon for his pancrease with tolerability issues.    Myoview was updated earlier this month and with reassuring findings, however his symptoms persisted and patient was referred for cardiac cath - see below - to continue with medical management.   The patient  does not have symptoms concerning for COVID-19 infection (fever, chills, cough, or new shortness of breath).   Seen today via telephone - he does not have a smart phone and has declined My Chart activation. He has consented for this telephone visit. I actually used to take care of his brother Ricardo Le.  [name] has had more GI issues since his cath - has had a call in to [facility] for guidance - he is being worked up for recurrent H pylori. He is now off his PPI therapy so he can get tested next week. He continues to have exertional chest pain. He is not taking his Ranexa - he did not realize that he should be taking this - he does have it. He has been trying to do his walking - previously was able to walk a mile a day but over the past 6 months due to progressive chest pain - has not been able. He is trying to get back to his walking. His cath site from his arm looks. He notes his blood sugar is ok. He has no other real concerns.          who is being seen today for the evaluation of dyspnea and suspected new onset CHF, at the request of Ricardo Le.   Pt had telehealth visit (telephone) with Ricardo Le earlier this morning and endorsed new dyspnea concerning for CHF. Ricardo Le has ordered a CXR, BNP/ BMP and CBC and recommend office visit for  exam and further evaluation.   Pt also has a h/o CAD s/p CABG in 2009. He also has PAD followed by Dr Ricardo Le, HTN, HLD on statin and pancreatic cancer. Activity limited by lumbar spine disease. He has been intolerant to imdur in past  He has been having chronic chest pain. Myovue 05/16/18 was non ischemic with EF 78%. Despite Myoview, he has had persistent pain and was referred for definative cath which was done 05/28/18. Patient's grafts and diffuse disease distal to grafts not amenable to intervention. No change from prior cath 02/20/17. Medical Rx recommended. He had f/u with Ricardo Le 06/10/18 and was restarted on Ranexa.  He had a telephone visit with  Ricardo Le today. He complained of dyspnea for 2 months. Endorses orthopnea and PND. Can't lay flat. Has been sleeping in a chair. He denies denies cough, sputum or fever. He denied LEE. Has had some abdominal discomfort but no nausea. Weight has been stable. No chest pain. Reported compliance with meds. Pt added on to my schedule.   In clinic, he does not appear grossly volume overloaded.  There is no peripheral edema.  No JVD.  He endorses subjective abdominal distention and discomfort.  He does have faint bibasilar crackles on exam right greater than left and expiratory rhonchi bilaterally.  No wheezing.  He completed his chest x-ray just prior to the appointment.  Radiologist report reads the following: The heart size and mediastinal contours are within normal limits.  Prior CABG.  Normal pulmonary vascularity.  Unchanged elevation of the right hemidiaphragm.  Persistent low lung volumes.  No focal consolidation, pleural effusion or pneumothorax.  No acute osseous abnormality.  Air within the colon hepatic flexure underneath the right hemidiaphragm   Overall impression: No active cardiopulmonary disease.  His blood pressure in clinic today is 138/60.  Heart rate 61 bpm.  EKG shows normal sinus rhythm with left axis deviation.  Oxygen saturations 96% on room air.  His temperature was checked during screening, prior to entering our clinic and he was afebrile.  Meds reviewed.  Patient is on low-dose combination lisinopril hydrochlorothiazide.   Patient is retired.  Used to work as a Theme [employer] manager.  We discussed environmental exposures.  Roughly 10 years ago he worked on a old house.  Was unsure of any exposure to asbestos.  We are still awaiting results of laboratory work to return.  The patient {does/does not:200015} have symptoms concerning for COVID-19 infection (fever, chills, cough, or new shortness of breath).   Seen today via ***. *** has consented for this visit.    Past Medical History:   Diagnosis Date  . CAD (coronary artery disease)    a. 04/2007 s/p CABG x 4  - LIMA->LAD, VG->OM1->Om2, VG->PDA;  b. 12/2008 & 06/2010 Caths - Native multivessel dzs with 4/4 patent grafts. c. cath 03/09/2015 patent grafts, unchanged from prior cath.  . Cancer [facility])    pancreatic  March 2011  . Carotid bruit    a. 07/2010 U/S- 0-39% bilat ICA stenosis  . Chest pain    Noncardiac probably related to reflux  . DDD (degenerative disc disease)    several surgeries  . Fracture acetabulum-closed ([place]) 07/08/2018  . GERD (gastroesophageal reflux disease)   . HNP (herniated nucleus pulposus), lumbar    a. L2-3, s/p laminotomy, microdiskectomy 02/2009  . HOH (hard of hearing)    uses amplifiers   . Nephrolithiasis   . PAD (peripheral artery disease) ([place]) 04/2014   s/p L Common Iliac Stent  .  Palpitations   . 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 Pneumonia    hx of  pna  . Pure hypercholesterolemia    statin intolerance  . Type II or unspecified type diabetes mellitus without mention of complication, not stated as uncontrolled    insulin dependent  . Unspecified essential hypertension    Past Surgical History:  Procedure Laterality Date  . ABDOMINAL AORTAGRAM N/A 05/13/2014   Procedure: ABDOMINAL [name];  Surgeon: Wellington Hampshire, MD;  Location: [place] CATH LAB;  Service: Cardiovascular;  Laterality: N/A;  . ANTERIOR CERVICAL DECOMP/DISCECTOMY FUSION N/A 07/28/2014   Procedure: ANTERIOR CERVICAL DECOMPRESSION/DISCECTOMY FUSION CERVICAL FOUR-FIVE CERVICAL FIVE-SIX ;  Surgeon: Earnie Larsson, MD;  Location: [place] NEURO ORS;  Service: Neurosurgery;  Laterality: N/A;  . APPENDECTOMY    . arthroscopy right knee    . BACK SURGERY    . CARDIAC CATHETERIZATION N/A 03/09/2015   Procedure: Left Heart Cath and Cors/Grafts Angiography;  Surgeon: [name] Sine, MD;  Location: [place] CV LAB;  Service: Cardiovascular;  Laterality: N/A;  . CHOLECYSTECTOMY    .  CORONARY ARTERY BYPASS GRAFT     X 4 2009 (LIMA to LAD, SVG to first cicumflex marginal branch with sequential SVG to second cicumflex marginal branch  and saphenous vein to posterior descending coronary artery with endoscopic,vein harvest rt. lower exttremity by RicardoOwen on March 31,2009  . CYST REMOVED     FROM SPINE  . CYSTOURETHROSCOPY     right retrograde pyelogram,manipulate stone in the renal pelvis, rt. double-j catheter.  [name] Kitchen EYE SURGERY    . KNEE SURGERY     rt knee  . LEFT HEART CATH AND CORS/GRAFTS ANGIOGRAPHY N/A 02/20/2017   Procedure: LEFT HEART CATH AND CORS/GRAFTS ANGIOGRAPHY;  Surgeon: Leonie Man, MD;  Location: [place] CV LAB;  Service: Cardiovascular;  Laterality: N/A;  . LEFT HEART CATH AND CORS/GRAFTS ANGIOGRAPHY N/A 05/28/2018   Procedure: LEFT HEART CATH AND CORS/GRAFTS ANGIOGRAPHY;  Surgeon: Burnell Blanks, MD;  Location: [place] CV LAB;  Service: Cardiovascular;  Laterality: N/A;  . NEPHROLITHOTOMY     PERCUTANEOUS  . PARS PLANA VITRECTOMY     lt. eye,retinal photocoagulation lt. eye, membrane peel lt. eye.  [name] Kitchen PERCUTANEOUS STENT INTERVENTION Left 05/13/2014   Procedure: PERCUTANEOUS STENT INTERVENTION;  Surgeon: Wellington Hampshire, MD;  Location: [place] CATH LAB;  Service: Cardiovascular;  Laterality: Left;  COMMON ILLIAC  . re-exploratin of laminectomy  05/2008   (RT L2-3) WITH REDO MICRODISKECTOMY     No outpatient medications have been marked as taking for the 07/24/18 encounter (Appointment) with Burtis Junes, NP.     Allergies:   Oxycodone hcl; Rosuvastatin calcium; Tramadol; Iohexol; and Statins   Social History   Tobacco Use  . Smoking status: Never Smoker  . Smokeless tobacco: Never Used  Substance Use Topics  . Alcohol use: No  . Drug use: No     Family Hx: The patient's family history includes Coronary artery disease in his brother and sister; Diabetes in his brother; Heart attack in his father; Other in his mother and another  family member.  ROS:   Please see the history of present illness.   All other systems reviewed are negative except for ***.    Objective:    Vital Signs:  There were no vitals taken for this visit.   Wt Readings from Last 3 Encounters:  07/21/18 147 lb 11.3 oz (67  kg)  07/11/18 153 lb 3.2 oz (69.5 kg)  07/04/18 160 lb 12.8 oz (72.9 kg)    Alert male in no acute distress.   Labs/Other Tests and Data Reviewed:    Lab Results  Component Value Date   WBC 11.5 (H) 07/23/2018   HGB 14.4 07/23/2018   HCT 40.8 07/23/2018   PLT 342 07/23/2018   GLUCOSE 133 (H) 07/23/2018   CHOL 138 02/21/2017   TRIG 44 02/21/2017   HDL 43 02/21/2017   LDLCALC 86 02/21/2017   ALT 71 (H) 07/20/2018   AST 46 (H) 07/20/2018   NA 131 (L) 07/23/2018   K 3.7 07/23/2018   CL 95 (L) 07/23/2018   CREATININE 0.91 07/23/2018   BUN 14 07/23/2018   CO2 24 07/23/2018   INR 1.03 02/20/2017   HGBA1C 7.9 (H) 07/21/2018     BNP (last 3 results) Recent Labs    07/08/18 1829  BNP 37.8    ProBNP (last 3 results) Recent Labs    07/04/18 1311  PROBNP 137      Prior CV studies:    The following studies were reviewed today:  ***Cath 05/28/18  1. Severe triple vessel CAD 2. Chronic occlusion proximal LAD. The mid and distal LAD fills from the patent LIMA graft to the mid LAD.  3. Chronic occlusion proximal Circumflex. The first and second obtuse marginal branches fill from the patent sequential vein graft 4. Chronic occlusion of the ostial RCA. The distal branches fill from the patent vein graft.  5. Normal LV systolic function.  6. The target vessels are diffusely diseased and unchanged grossly from last cath in 2019. No targets for PCI.   Recommendations: Continue medical management of CAD. Consider titration of anti-anginal therapy.   ASSESSMENT & PLAN:    1.  Dyspnea: Symptoms concerning for early CHF.  Although his chest x-ray showed no active cardiopulmonary disease he does endorse  exertional dyspnea, orthopnea, PND and abdominal fullness.  No lower extremity edema present on exam and no JVD, however he is noted to have faint bibasilar crackles on examination, right greater than left with expiratory rhonchi.  We are still awaiting the return of laboratory results including a BNP, BMP and CBC.  If BNP is elevated, and if stable renal function and electrolytes, we will plan to call in Rx for furosemide. If diuretics are indicated and CHF diagnosis made, will plan f/u in 7 days w/ repeat labs and will consider echocardiogram to assess LVEF. Given chest x-ray report, if his BNP is normal, we will need to examine noncardiac causes of dyspnea and will refer to pulmonology.  Currently low suspicion for PE.      Myoview Study Highlights 05/16/2018     The left ventricular ejection fraction is hyperdynamic (>65%).  Nuclear stress EF: 78%.  There was no ST segment deviation noted during stress. The study is normal.Normal pharmacologic nuclear stress test with no evidence for a prior infarct or ischemia      ASSESSMENT & PLAN:    1.  Chest pain with recent cardiac cath with findings as above - to continue with medical management due to diffuse disease of the target vessels. He was started on Ranexa prior to his cath - he has not been taking - we will restart this today. Hopefully his angina will improve. Will recheck him in a month. Ok to walk as tolerated - he has NTG on hand as well.   2. Remote CABG - see #1.  3. HLD - statin intolerance indicated but he is taking Lipitor at this time and seems to be able to tolerate   4. 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   5. PAD - prior left iliac artery stenting - has known bilateral SFA occlusion with mild claudication - followed by Dr. Fletcher Le - to see him back in August per recall.    6. Bilateral carotid disease - mild per duplex study from 02/2017  7. Chronic GI issues - currently being worked up for [employer]      . COVID-19 Education: The signs and symptoms of COVID-19 were discussed with the patient and how to seek care for testing (follow up with PCP or arrange E-visit).  The importance of social distancing, staying at home, hand hygiene and wearing a mask when out in public were discussed today.  Patient Risk:   After full review of this patient's clinical status, I feel that they are at least moderate risk at this time.  Time:   Today, I have spent *** minutes with the patient with telehealth technology discussing the above issues.     Medication Adjustments/Labs and Tests Ordered: Current medicines are reviewed at length with the patient today.  Concerns regarding medicines are outlined above.   Tests Ordered: No orders of the defined types were placed in this encounter.   Medication Changes: No orders of the defined types were placed in this encounter.   Disposition:  FU with *** in {gen number [id number]} {Days to years:10300}.   Patient is agreeable to this plan and will call if any problems develop in the interim.   Amie Critchley, NP  07/23/2018 8:04 AM    [place]

## 2018-07-23 NOTE — Progress Notes (Signed)
Inpatient Rehabilitation Admissions Coordinator  I met with patient and his daughter, Lynelle Smoke, at bedside. Patient complains of severe headache and inability to void. RN, Ander Purpura, nurse aware and has orders to complete for pain med, scan and I and O cath. I will follow up for possible admit to CIR pending insurance approval when pt medically ready for d/c.  Danne Baxter, RN, MSN Rehab Admissions Coordinator [phone] 07/23/2018 10:55 AM

## 2018-07-24 ENCOUNTER — Other Ambulatory Visit: Payer: Self-pay

## 2018-07-24 ENCOUNTER — Telehealth: Payer: Self-pay | Admitting: Cardiovascular Disease

## 2018-07-24 ENCOUNTER — Telehealth: Payer: PPO | Admitting: Nurse Practitioner

## 2018-07-24 ENCOUNTER — Inpatient Hospital Stay (HOSPITAL_COMMUNITY): Payer: PPO | Admitting: Occupational Therapy

## 2018-07-24 ENCOUNTER — Inpatient Hospital Stay (HOSPITAL_COMMUNITY): Payer: PPO | Admitting: Physical Therapy

## 2018-07-24 ENCOUNTER — Inpatient Hospital Stay (HOSPITAL_COMMUNITY): Payer: PPO | Admitting: Speech Pathology

## 2018-07-24 DIAGNOSIS — S32402A Unspecified fracture of left acetabulum, initial encounter for closed fracture: Secondary | ICD-10-CM

## 2018-07-24 DIAGNOSIS — S06309A Unspecified focal traumatic brain injury with loss of consciousness of unspecified duration, initial encounter: Secondary | ICD-10-CM

## 2018-07-24 LAB — CBC WITH DIFFERENTIAL/PLATELET
Abs Immature Granulocytes: 0.06 10*3/uL (ref 0.00–0.07)
Basophils Absolute: 0.1 10*3/uL (ref 0.0–0.1)
Basophils Relative: 0 %
Eosinophils Absolute: 0 10*3/uL (ref 0.0–0.5)
Eosinophils Relative: 0 %
HCT: 40.4 % (ref 39.0–52.0)
Hemoglobin: 14.3 g/dL (ref 13.0–17.0)
Immature Granulocytes: 1 %
Lymphocytes Relative: 13 %
Lymphs Abs: 1.5 10*3/uL (ref 0.7–4.0)
MCH: 31.5 pg (ref 26.0–34.0)
MCHC: 35.4 g/dL (ref 30.0–36.0)
MCV: 89 fL (ref 80.0–100.0)
Monocytes Absolute: 1.4 10*3/uL — ABNORMAL HIGH (ref 0.1–1.0)
Monocytes Relative: 12 %
Neutro Abs: 8.9 10*3/uL — ABNORMAL HIGH (ref 1.7–7.7)
Neutrophils Relative %: 74 %
Platelets: 351 10*3/uL (ref 150–400)
RBC: 4.54 MIL/uL (ref 4.22–5.81)
RDW: 11.9 % (ref 11.5–15.5)
WBC: 11.9 10*3/uL — ABNORMAL HIGH (ref 4.0–10.5)
nRBC: 0 % (ref 0.0–0.2)

## 2018-07-24 LAB — COMPREHENSIVE METABOLIC PANEL
ALT: 36 U/L (ref 0–44)
AST: 28 U/L (ref 15–41)
Albumin: 3 g/dL — ABNORMAL LOW (ref 3.5–5.0)
Alkaline Phosphatase: 165 U/L — ABNORMAL HIGH (ref 38–126)
Anion gap: 12 (ref 5–15)
BUN: 15 mg/dL (ref 8–23)
CO2: 25 mmol/L (ref 22–32)
Calcium: 8.9 mg/dL (ref 8.9–10.3)
Chloride: 92 mmol/L — ABNORMAL LOW (ref 98–111)
Creatinine, Ser: 0.91 mg/dL (ref 0.61–1.24)
GFR calc Af Amer: >60 mL/min (ref >60)
GFR calc non Af Amer: >60 mL/min (ref >60)
Glucose, Bld: 134 mg/dL — ABNORMAL HIGH (ref 70–99)
Potassium: 3.8 mmol/L (ref 3.5–5.1)
Sodium: 129 mmol/L — ABNORMAL LOW (ref 135–145)
Total Bilirubin: 1.3 mg/dL — ABNORMAL HIGH (ref 0.3–1.2)
Total Protein: 6.7 g/dL (ref 6.5–8.1)

## 2018-07-24 LAB — GLUCOSE, CAPILLARY
Glucose-Capillary: 118 mg/dL — ABNORMAL HIGH (ref 70–99)
Glucose-Capillary: 197 mg/dL — ABNORMAL HIGH (ref 70–99)
Glucose-Capillary: 126 mg/dL — ABNORMAL HIGH (ref 70–99)
Glucose-Capillary: 236 mg/dL — ABNORMAL HIGH (ref 70–99)

## 2018-07-24 MED ORDER — SUCRALFATE 1 GM/10ML PO SUSP
1.0000 g | Freq: Three times a day (TID) | ORAL | Status: DC
  Administered 2018-07-24 – 2018-07-30 (×24): 1 g via ORAL
  Filled 2018-07-24 (×27): qty 10

## 2018-07-24 MED ORDER — ORAL CARE MOUTH RINSE
15.0000 mL | Freq: Two times a day (BID) | OROMUCOSAL | Status: DC
  Administered 2018-07-24 – 2018-07-30 (×13): 15 mL via OROMUCOSAL

## 2018-07-24 NOTE — Telephone Encounter (Signed)
See phone note from 6/1 [place]. CMA where Truitt Merle, NP advised to d/c Ranexa and only take NTG.

## 2018-07-24 NOTE — Progress Notes (Signed)
Patient ID: Ricardo Le, male   DOB: 11-06-1940, 78 y.o.   MRN: [id number] Pt in bed daughter is with pt by permission. Pt denies any pain. Admission completed. Pt's wife was on cell phone while admission being completed. Educated to [employer] P/P's and safety measures. Questions answered.

## 2018-07-24 NOTE — Plan of Care (Signed)
Pt has Stage II pressure injury to coccyx  Bilateral Stage I pressure injury to heels  Areas cleansed Foam applied to all areas.

## 2018-07-24 NOTE — H&P (Signed)
Physical Medicine and Rehabilitation Admission H&P        Chief Complaint  Patient presents with  . Chest Pain  : HPI: Ricardo Le is a 78 year old right-handed male with history of diabetes mellitus, CAD/CABG 2009, hypertension.  Patient with recent admission 07/08/2018 to 07/11/2018 after recent fall sustaining a left acetabular fracture that was nonoperative followed by Dr. Edmonia Lynch orthopedic services touchdown weightbearing.  Also completed CT angiogram of chest that was negative for pulmonary emboli.  He was discharged home 07/11/2018 ambulating minimal guard using rolling walker maintaining weightbearing precautions.  Per chart review patient lives with spouse independent prior to initial fall.  1 level home one-step to entry.  Presented 07/20/2018 with headache and altered mental status.  No additional falls noted since most recent event.  Cranial CT scan showed intraventricular hemorrhage involving bilateral lateral ventricles, left side greater than right.  There was also concern for small amount of subarachnoid blood in the left frontal lobe.  Neurosurgery Dr. Sherley Bounds consulted with conservative care with follow-up cranial CT scan unchanged no new site of hemorrhage.  No hydrocephalus and no mass-effect.  Patient had been on aspirin prior to admission remained on hold due to hemorrhage.  Noted gradual hearing loss post fall recommendations have been made for ENT follow-up as outpatient.  Patient with persistent confusion family had been cleared to stay with patient.  Cranial CT scan completed 07/23/2018 showing no new acute changes.  Tolerating a regular consistency diet.  Therapy evaluations completed and patient was admitted for a comprehensive rehab program   Review of Systems  Constitutional: Negative for chills and fever.  HENT: Positive for hearing loss.   Eyes: Negative for blurred vision and double vision.  Respiratory: Negative for cough and shortness of breath.    Cardiovascular: Positive for leg swelling. Negative for chest pain and palpitations.  Gastrointestinal: Positive for constipation. Negative for heartburn, nausea and vomiting.       GERD  Genitourinary: Negative for dysuria, flank pain and hematuria.  Musculoskeletal: Positive for back pain and falls.  Skin: Negative for rash.  Neurological: Positive for dizziness.  Psychiatric/Behavioral: Positive for memory loss.  All other systems reviewed and are negative.       Past Medical History:  Diagnosis Date  . CAD (coronary artery disease)      a. 04/2007 s/p CABG x 4  - LIMA->LAD, VG->OM1->Om2, VG->PDA;  b. 12/2008 & 06/2010 Caths - Native multivessel dzs with 4/4 patent grafts. c. cath 03/09/2015 patent grafts, unchanged from prior cath.  . Cancer [facility])      pancreatic  March 2011  . Carotid bruit      a. 07/2010 U/S- 0-39% bilat ICA stenosis  . Chest pain      Noncardiac probably related to reflux  . DDD (degenerative disc disease)      several surgeries  . Fracture acetabulum-closed ([place]) 07/08/2018  . GERD (gastroesophageal reflux disease)    . HNP (herniated nucleus pulposus), lumbar      a. L2-3, s/p laminotomy, microdiskectomy 02/2009  . HOH (hard of hearing)      uses amplifiers   . Nephrolithiasis    . PAD (peripheral artery disease) ([place]) 04/2014    s/p L Common Iliac Stent  . Palpitations    . Pancreatic mass      a. 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 Pneumonia      hx of  pna  . Pure hypercholesterolemia  statin intolerance  . Type II or unspecified type diabetes mellitus without mention of complication, not stated as uncontrolled      insulin dependent  . Unspecified essential hypertension           Past Surgical History:  Procedure Laterality Date  . ABDOMINAL AORTAGRAM N/A 05/13/2014    Procedure: ABDOMINAL [name];  Surgeon: Wellington Hampshire, MD;  Location: [place] CATH LAB;  Service: Cardiovascular;  Laterality: N/A;  . ANTERIOR  CERVICAL DECOMP/DISCECTOMY FUSION N/A 07/28/2014    Procedure: ANTERIOR CERVICAL DECOMPRESSION/DISCECTOMY FUSION CERVICAL FOUR-FIVE CERVICAL FIVE-SIX ;  Surgeon: Earnie Larsson, MD;  Location: [place] NEURO ORS;  Service: Neurosurgery;  Laterality: N/A;  . APPENDECTOMY      . arthroscopy right knee      . BACK SURGERY      . CARDIAC CATHETERIZATION N/A 03/09/2015    Procedure: Left Heart Cath and Cors/Grafts Angiography;  Surgeon: [name] Sine, MD;  Location: [place] CV LAB;  Service: Cardiovascular;  Laterality: N/A;  . CHOLECYSTECTOMY      . CORONARY ARTERY BYPASS GRAFT        X 4 2009 (LIMA to LAD, SVG to first cicumflex marginal branch with sequential SVG to second cicumflex marginal branch  and saphenous vein to posterior descending coronary artery with endoscopic,vein harvest rt. lower exttremity by Dr.Owen on March 31,2009  . CYST REMOVED        FROM SPINE  . CYSTOURETHROSCOPY        right retrograde pyelogram,manipulate stone in the renal pelvis, rt. double-j catheter.  [name] Kitchen EYE SURGERY      . KNEE SURGERY        rt knee  . LEFT HEART CATH AND CORS/GRAFTS ANGIOGRAPHY N/A 02/20/2017    Procedure: LEFT HEART CATH AND CORS/GRAFTS ANGIOGRAPHY;  Surgeon: Leonie Man, MD;  Location: [place] CV LAB;  Service: Cardiovascular;  Laterality: N/A;  . LEFT HEART CATH AND CORS/GRAFTS ANGIOGRAPHY N/A 05/28/2018    Procedure: LEFT HEART CATH AND CORS/GRAFTS ANGIOGRAPHY;  Surgeon: Burnell Blanks, MD;  Location: [place] CV LAB;  Service: Cardiovascular;  Laterality: N/A;  . NEPHROLITHOTOMY        PERCUTANEOUS  . PARS PLANA VITRECTOMY        lt. eye,retinal photocoagulation lt. eye, membrane peel lt. eye.  [name] Kitchen PERCUTANEOUS STENT INTERVENTION Left 05/13/2014    Procedure: PERCUTANEOUS STENT INTERVENTION;  Surgeon: Wellington Hampshire, MD;  Location: [place] CATH LAB;  Service: Cardiovascular;  Laterality: Left;  COMMON ILLIAC  . re-exploratin of laminectomy   05/2008    (RT L2-3) WITH REDO  MICRODISKECTOMY         Family History  Problem Relation Age of Onset  . Heart attack Father          died of MI @ 63  . Diabetes Brother          type 2  . Other Mother          died @ 32  . Coronary artery disease Sister          alive  . Other Other          no fh of colon cancer  . Coronary artery disease Brother          s/p CABG.  Alive @ 73    Social History:  reports that he has never smoked. He has never used smokeless tobacco. He reports that he does not drink alcohol or use drugs. Allergies:  Allergies  Allergen Reactions  . Oxycodone Hcl Nausea And Vomiting  . Rosuvastatin Calcium Other (See Comments)      Myalgias  . Tramadol Other (See Comments)      Makes patient "spaced out and dizzy"  . Iohexol Hives      Patient is ok with premedication (Benadryl 50 mg by mouth); Onset Date: 12/03/2007    . Statins Other (See Comments)      Myalgias- can tolerate Atorvastatin            Medications Prior to Admission  Medication Sig Dispense Refill  . acetaminophen (TYLENOL 8 HOUR ARTHRITIS PAIN) 650 MG CR tablet Take 650 mg by mouth every 8 (eight) hours as needed for pain (arthritis).      [name] Kitchen acetaminophen (TYLENOL) 325 MG tablet Take 650 mg by mouth every 6 (six) hours as needed (pain).      [name] Kitchen amLODipine (NORVASC) 10 MG tablet Take 10 mg by mouth daily.      [name] Kitchen aspirin EC 81 MG tablet Take 81 mg by mouth at bedtime.      [name] Kitchen atorvastatin (LIPITOR) 20 MG tablet Take 20 mg by mouth at bedtime.       . bisacodyl (DULCOLAX) 5 MG EC tablet Take 5 mg by mouth every 3 (three) days.       [name] Kitchen esomeprazole (NEXIUM) 40 MG capsule Take 40 mg by mouth 2 (two) times daily before a meal.       . gabapentin (NEURONTIN) 100 MG capsule Take 100 mg by mouth 2 (two) times daily as needed (for nerve pain).       [name] Kitchen glipiZIDE (GLUCOTROL) 10 MG tablet Take 10 mg by mouth 2 (two) times daily.       . insulin glargine (LANTUS SOLOSTAR) 100 UNIT/ML injection Inject 15 Units into the skin at  bedtime.       [name] Kitchen lisinopril-hydrochlorothiazide (PRINZIDE,ZESTORETIC) 20-12.5 MG per tablet Take 1 tablet by mouth daily.        . nitroGLYCERIN (NITROSTAT) 0.4 MG SL tablet Place 1 tablet (0.4 mg total) under the tongue every 5 (five) minutes as needed for chest pain. 25 tablet 3  . polyethylene glycol (MIRALAX / GLYCOLAX) 17 g packet Take 17 g by mouth daily as needed for severe constipation. (Patient taking differently: Take 17 g by mouth daily as needed (for constipation). ) 14 each 0  . sucralfate (CARAFATE) 1 GM/10ML suspension Take 1 g by mouth 4 (four) times daily.      . tamsulosin (FLOMAX) 0.4 MG CAPS capsule Take 0.4 mg by mouth daily after supper.      . docusate sodium (COLACE) 100 MG capsule Take 1 capsule (100 mg total) by mouth 2 (two) times daily. (Patient not taking: Reported on 07/20/2018) 10 capsule 0  . Multiple Vitamin (MULTIVITAMIN WITH MINERALS) TABS tablet Take 1 tablet by mouth daily.      . ranolazine (RANEXA) 500 MG 12 hr tablet Take 1 tablet (500 mg total) by mouth 2 (two) times daily. (Patient not taking: Reported on 07/20/2018)          Drug Regimen Review Drug regimen was reviewed and remains appropriate with no significant issues identified   Home: Home Living Family/patient expects to be discharged to:: Private residence Living Arrangements: Spouse/significant other Available Help at Discharge: Family, Available 24 hours/day Type of Home: House Home Access: Stairs to enter [employer] of Steps: 1 Entrance Stairs-Rails: None Home Layout: One level Bathroom Shower/Tub: Holiday representative  Toilet: Standard Home Equipment: Bedside commode, Walker - 2 wheels, Shower seat - built in, [employer] - tub/shower, Hand held shower head   Functional History: Prior Function Level of Independence: Independent   Functional Status:  Mobility: Bed Mobility Overal bed mobility: Needs Assistance Bed Mobility: Supine to Sit Supine to sit: Mod assist  General bed mobility comments: heavy mod to EOB, needing assist to pull to sit and for bed pad to right hips Transfers Overall transfer level: Needs assistance Equipment used: Rolling walker (2 wheeled) Transfers: Sit to/from Stand Sit to Stand: Mod assist, +2 physical assistance, From elevated surface Stand pivot transfers: Mod assist, +2 physical assistance General transfer comment: consistent VC's needed for WB status, pt difficulty maintaining (suspect HOH and cog) Ambulation/Gait General Gait Details: Unable to progress to gait training   ADL: ADL Overall ADL's : Needs assistance/impaired Eating/Feeding: Set up, Sitting Grooming: Set up, Sitting, Wash/dry hands, Wash/dry face, Oral care Grooming Details (indicate cue type and reason): not able to maintain standing position, needs sitting- okay with automatic tasks, some cueing due to cognition for sequencing Upper Body Bathing: Set up, Sitting Lower Body Bathing: Moderate assistance, Sitting/lateral leans Lower Body Bathing Details (indicate cue type and reason): assist for LLE Lower Body Dressing: Moderate assistance, Sit to/from stand Lower Body Dressing Details (indicate cue type and reason): requires assist for LLE, can access RLE Toilet Transfer: Moderate assistance, +2 for physical assistance, +2 for safety/equipment, Cueing for safety, Cueing for sequencing, BSC, RW Toileting- Clothing Manipulation and Hygiene: Sit to/from stand, Maximal assistance Tub/ Shower Transfer: Walk-in shower, Moderate assistance, Ambulation, Rolling walker, +2 for physical assistance, +2 for safety/equipment Functional mobility during ADLs: Moderate assistance, +2 for safety/equipment, Rolling walker, Cueing for safety, Cueing for sequencing General ADL Comments: pt very HOH and with increased confusion this date; able to tolerate stand pivot with mod +2- not consistently following WB   Cognition: Cognition Overall Cognitive Status:  Impaired/Different from baseline Orientation Level: Oriented to person, Oriented to place, Disoriented to time, Disoriented to situation Cognition Arousal/Alertness: Awake/alert Behavior During Therapy: Restless, Impulsive Overall Cognitive Status: Impaired/Different from baseline Area of Impairment: Orientation, Attention, Memory, Following commands, Safety/judgement, Problem solving, Awareness Orientation Level: Disoriented to, Situation, Place, Time Current Attention Level: Sustained Memory: Decreased short-term memory Following Commands: Follows one step commands consistently, Follows one step commands with increased time Safety/Judgement: Decreased awareness of safety, Decreased awareness of deficits Awareness: Intellectual Problem Solving: Slow processing, Decreased initiation, Difficulty sequencing, Requires verbal cues, Requires tactile cues General Comments: difficult due to pt being extremely [facility]   Physical Exam: Blood pressure 122/61, pulse 66, temperature 98 F (36.7 C), temperature source Oral, resp. rate 18, height 5\' 5"  (1.651 m), weight 67 kg, SpO2 95 %. Physical Exam  Neurological:  Patient is alert.Complaints of headache.daughter is at bedside.Provides name and age.Follows simple commands  Skin:  Stage II pressure injury to coccyx and bilateral stage I pressure injury to heels    General: No acute distress Mood and affect are appropriate Heart: Regular rate and rhythm no rubs murmurs or extra sounds Lungs: Clear to auscultation, breathing unlabored, no rales or wheezes Abdomen: Positive bowel sounds, soft nontender to palpation, nondistended Extremities: No clubbing, cyanosis, or edema Skin: No evidence of breakdown, no evidence of rash Neurologic: Cranial nerves II through XII intact, motor strength is 4/5 in bilateral deltoid, bicep, tricep, grip, hip flexor, knee extensors, ankle dorsiflexor and plantar flexor Sensory exam normal sensation to light touch and  proprioception in bilateral upper  and lower extremities Cerebellar exam bilateral tremor  finger to nose to finger Musculoskeletal: Full range of motion in all 4 extremities. No joint swelling  MSK limited L Hip ROM but no pain , no LE swelling Lab Results Last 48 Hours        Results for orders placed or performed during the hospital encounter of 07/20/18 (from the past 48 hour(s))  Glucose, capillary     Status: Abnormal    Collection Time: 07/21/18 12:52 PM  Result Value Ref Range    Glucose-Capillary 214 (H) 70 - 99 mg/dL  Glucose, capillary     Status: Abnormal    Collection Time: 07/21/18  4:30 PM  Result Value Ref Range    Glucose-Capillary 138 (H) 70 - 99 mg/dL  Glucose, capillary     Status: Abnormal    Collection Time: 07/21/18  8:22 PM  Result Value Ref Range    Glucose-Capillary 197 (H) 70 - 99 mg/dL  Glucose, capillary     Status: Abnormal    Collection Time: 07/22/18  7:44 AM  Result Value Ref Range    Glucose-Capillary 202 (H) 70 - 99 mg/dL    Comment 1 Notify RN    CBC     Status: Abnormal    Collection Time: 07/22/18 10:27 AM  Result Value Ref Range    WBC 13.7 (H) 4.0 - 10.5 K/uL    RBC 4.67 4.22 - 5.81 MIL/uL    Hemoglobin 14.8 13.0 - 17.0 g/dL    HCT 41.5 39.0 - 52.0 %    MCV 88.9 80.0 - 100.0 fL    MCH 31.7 26.0 - 34.0 pg    MCHC 35.7 30.0 - 36.0 g/dL    RDW 11.9 11.5 - 15.5 %    Platelets 360 150 - 400 K/uL    nRBC 0.0 0.0 - 0.2 %      Comment: Performed at [facility], [place] [address] [place], [place] [address]  Basic metabolic panel     Status: Abnormal    Collection Time: 07/22/18 10:27 AM  Result Value Ref Range    Sodium 131 (L) 135 - 145 mmol/L    Potassium 3.7 3.5 - 5.1 mmol/L    Chloride 93 (L) 98 - 111 mmol/L    CO2 26 22 - 32 mmol/L    Glucose, Bld 149 (H) 70 - 99 mg/dL    BUN 12 8 - 23 mg/dL    Creatinine, Ser 0.84 0.61 - 1.24 mg/dL    Calcium 9.4 8.9 - 10.3 mg/dL    GFR calc non Af Amer >60 >60 mL/min    GFR calc Af Amer >60  >60 mL/min    Anion gap 12 5 - 15      Comment: Performed at [facility], [place] [address], [place], [place] [address]  Glucose, capillary     Status: Abnormal    Collection Time: 07/22/18 11:43 AM  Result Value Ref Range    Glucose-Capillary 202 (H) 70 - 99 mg/dL  Glucose, capillary     Status: Abnormal    Collection Time: 07/22/18  4:32 PM  Result Value Ref Range    Glucose-Capillary 106 (H) 70 - 99 mg/dL  Glucose, capillary     Status: Abnormal    Collection Time: 07/22/18  9:27 PM  Result Value Ref Range    Glucose-Capillary 153 (H) 70 - 99 mg/dL  CBC     Status: Abnormal    Collection Time: 07/23/18  3:33 AM  Result Value Ref Range    WBC 11.5 (H) 4.0 - 10.5 K/uL    RBC 4.49 4.22 - 5.81 MIL/uL    Hemoglobin 14.4 13.0 - 17.0 g/dL    HCT 40.8 39.0 - 52.0 %    MCV 90.9 80.0 - 100.0 fL    MCH 32.1 26.0 - 34.0 pg    MCHC 35.3 30.0 - 36.0 g/dL    RDW 11.9 11.5 - 15.5 %    Platelets 342 150 - 400 K/uL    nRBC 0.0 0.0 - 0.2 %      Comment: Performed at [facility], [place] [address], [place], [place] [address]  Basic metabolic panel     Status: Abnormal    Collection Time: 07/23/18  3:33 AM  Result Value Ref Range    Sodium 131 (L) 135 - 145 mmol/L    Potassium 3.7 3.5 - 5.1 mmol/L    Chloride 95 (L) 98 - 111 mmol/L    CO2 24 22 - 32 mmol/L    Glucose, Bld 133 (H) 70 - 99 mg/dL    BUN 14 8 - 23 mg/dL    Creatinine, Ser 0.91 0.61 - 1.24 mg/dL    Calcium 9.1 8.9 - 10.3 mg/dL    GFR calc non Af Amer >60 >60 mL/min    GFR calc Af Amer >60 >60 mL/min    Anion gap 12 5 - 15      Comment: Performed at [facility], [place] [address] [place], [place] [address]  Vitamin B12     Status: None    Collection Time: 07/23/18  3:33 AM  Result Value Ref Range    Vitamin B-12 616 180 - 914 pg/mL      Comment: (NOTE) This assay is not validated for testing neonatal or myeloproliferative syndrome specimens for Vitamin B12 levels. Performed at [facility], [place]  [address], [place], [place] [address]    Glucose, capillary     Status: Abnormal    Collection Time: 07/23/18  6:07 AM  Result Value Ref Range    Glucose-Capillary 129 (H) 70 - 99 mg/dL    Comment 1 Notify RN        Imaging Results (Last 48 hours)  No results found.           Medical Problem List and Plan: 1.  Decreased functional mobility with cognitive deficits secondary to traumatic intraventricular hemorrhage with conservative care as well as left acetabular fracture.  Weightbearing advanced to 50% partial weightbearing left lower extremity x2 weeks then progress to full weightbearing 2.  Antithrombotics: -DVT/anticoagulation: SCDs             -antiplatelet therapy: N/A 3. Pain Management: Neurontin 100 mg twice daily as needed 4. Mood: Melatonin 3 mg nightly             -antipsychotic agents: N/A 5. Neuropsych: This patient is not capable of making decisions on his own behalf. 6. Skin/Wound Care: Stage II pressure injury to coccyx.  Bilateral stage I pressure injury to heels.  Routine skin checks 7. Fluids/Electrolytes/Nutrition: Routine in and outs with follow-up chemistries 8.  Hypertension.  Ranexa 500 mg twice daily, lisinopril 20 mg daily, Norvasc 10 mg daily 9.  History of diabetes mellitus.  Hemoglobin A1c 7.9.  Lantus insulin 18 units nightly.  Check blood sugars before meals and at bedtime 10.  CAD with history of CABG.  Aspirin on hold due to intraventricular  hemorrhage.  Follow-up cardiology services as needed 11.  Hyperlipidemia.  Lipitor 12.  BPH.  Flomax 0.4 mg daily. 13.  GERD.  Protonix       Post Admission Physician Evaluation: 1. Functional deficits secondary  to Traumatic IVH and Left acetabular fx. 2. Patient admitted to receive collaborative, interdisciplinary care between the physiatrist, rehab nursing staff, and therapy team. 3. Patient's level of medical complexity and substantial therapy needs in context of that medical necessity cannot be provided at a  lesser intensity of care. 4. Patient has experienced substantial functional loss from his/her baseline.   Judging by the patient's diagnosis, physical exam, and functional history, the patient has potential for functional progress which will result in measurable gains while on inpatient rehab.  These gains will be of substantial and practical use upon discharge in facilitating mobility and self-care at the household level. 5. Physiatrist will provide 24 hour management of medical needs as well as oversight of the therapy plan/treatment and provide guidance as appropriate regarding the interaction of the two. 6. 24 hour rehab nursing will assist in the management of  bladder management, bowel management, safety, skin/wound care, disease management, medication administration, pain management and patient education  and help integrate therapy concepts, techniques,education, etc. 7. PT will assess and treat for:pre gait, gait training, endurance , safety, equipment, neuromuscular re education  .  Goals are: contact guard. 8. OT will assess and treat for ADLs, Cognitive perceptual skills, Neuromuscular re education, safety, endurance, equipment  .  Goals are: supervision.  9. SLP will assess and treat for cognition, communication   .  Goals are: supervision. 10. Case Management and Social Worker will assess and treat for psychological issues and discharge planning. 11. Team conference will be held weekly to assess progress toward goals and to determine barriers to discharge. 12.  Patient will receive at least 3 hours of therapy per day at least 5 days per week. 13. ELOS and Prognosis: 2-3wk fair  "I have personally performed a face to face diagnostic evaluation of this patient.  Additionally, I have reviewed and concur with the physician assistant's documentation above."   Charlett Blake M.D. [facility] FAAPM&R (Sports Med, Neuromuscular Med) Diplomate Am Board of Electrodiagnostic Med   Cathlyn Parsons, PA-C 07/23/2018

## 2018-07-24 NOTE — Telephone Encounter (Signed)
Patient's wife called stating the patient is in the hospital she said that Dr. Johnsie Cancel had taken him off of   ranolazine (RANEXA) 12 hr tablet 500 mg   She wants to make sure they are not giving it to him in the hospital.

## 2018-07-24 NOTE — Evaluation (Signed)
Physical Therapy Assessment and Plan  Patient Details  Name: Ricardo Le MRN: [id number] Date of Birth: 1940/02/26  PT Diagnosis: Abnormality of gait, Difficulty walking and Impaired cognition Rehab Potential: Good ELOS: 12-16 days    Today's Date: 07/24/2018 PT Individual Time: 1100-1200 PT Individual Time Calculation (min): 60 min    Problem List:  Patient Active Problem List   Diagnosis Date Noted  . Traumatic intraventricular hemorrhage ([place]) 07/23/2018  . Intraventricular hemorrhage ([place]) 07/20/2018  . Fracture of left acetabulum ([place]) 07/09/2018  . Elevated troponin   . Coronary artery disease involving native coronary artery of native heart with unstable angina pectoris ([place])   . Chest pain 02/20/2017  . Precordial chest pain   . Angina pectoris ([place]) 02/19/2017  . Atherosclerotic heart disease of native coronary artery with unstable angina pectoris ([place]) 03/08/2015  . Hyperlipidemia 01/26/2015  . Cervical spondylosis without myelopathy 07/28/2014  . Cervical spondylosis 07/28/2014  . Constipation 05/19/2014  . Pain 05/18/2014  . PAD (peripheral artery disease) ([place]) 03/20/2012  . Unstable angina ([place]) 05/05/2011  . DDD (degenerative disc disease)   . HNP (herniated nucleus pulposus), lumbar   . Pancreatic mass   . Nephrolithiasis   . Carotid bruit   . S/P CABG x 4 03/08/2011  . Bruit 07/06/2010  . NONSPECIFIC ABN FINDING RAD & OTH EXAM GI TRACT 06/09/2009  . NAUSEA ALONE 05/28/2009  . CHANGE IN BOWELS 05/28/2009  . Elevated lipids 05/27/2009  . Abdominal pain, generalized 05/27/2009  . Type 2 diabetes mellitus with vascular disease ([place]) 12/16/2008  . Essential hypertension 12/16/2008  . BACK PAIN 12/16/2008  . Palpitations 12/16/2008    Past Medical History:  Past Medical History:  Diagnosis Date  . CAD (coronary artery disease)    a. 04/2007 s/p CABG x 4  - LIMA->LAD, VG->OM1->Om2, VG->PDA;  b. 12/2008 & 06/2010 Caths - Native multivessel dzs with 4/4  patent grafts. c. cath 03/09/2015 patent grafts, unchanged from prior cath.  . Cancer [facility])    pancreatic  March 2011  . Carotid bruit    a. 07/2010 U/S- 0-39% bilat ICA stenosis  . Chest pain    Noncardiac probably related to reflux  . DDD (degenerative disc disease)    several surgeries  . Fracture acetabulum-closed ([place]) 07/08/2018  . GERD (gastroesophageal reflux disease)   . HNP (herniated nucleus pulposus), lumbar    a. L2-3, s/p laminotomy, microdiskectomy 02/2009  . HOH (hard of hearing)    uses amplifiers   . Nephrolithiasis   . PAD (peripheral artery disease) ([place]) 04/2014   s/p L Common Iliac Stent  . Palpitations   . 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 Pneumonia    hx of  pna  . Pure hypercholesterolemia    statin intolerance  . Type II or unspecified type diabetes mellitus without mention of complication, not stated as uncontrolled    insulin dependent  . Unspecified essential hypertension    Past Surgical History:  Past Surgical History:  Procedure Laterality Date  . ABDOMINAL AORTAGRAM N/A 05/13/2014   Procedure: ABDOMINAL [name];  Surgeon: Wellington Hampshire, MD;  Location: [employer] CATH LAB;  Service: Cardiovascular;  Laterality: N/A;  . ANTERIOR CERVICAL DECOMP/DISCECTOMY FUSION N/A 07/28/2014   Procedure: ANTERIOR CERVICAL DECOMPRESSION/DISCECTOMY FUSION CERVICAL FOUR-FIVE CERVICAL FIVE-SIX ;  Surgeon: Earnie Larsson, MD;  Location: [place] NEURO ORS;  Service: Neurosurgery;  Laterality: N/A;  . APPENDECTOMY    . arthroscopy right  knee    . BACK SURGERY    . CARDIAC CATHETERIZATION N/A 03/09/2015   Procedure: Left Heart Cath and Cors/Grafts Angiography;  Surgeon: [name] Sine, MD;  Location: [place] CV LAB;  Service: Cardiovascular;  Laterality: N/A;  . CHOLECYSTECTOMY    . CORONARY ARTERY BYPASS GRAFT     X 4 2009 (LIMA to LAD, SVG to first cicumflex marginal branch with sequential SVG to second cicumflex marginal branch   and saphenous vein to posterior descending coronary artery with endoscopic,vein harvest rt. lower exttremity by Dr.Owen on March 31,2009  . CYST REMOVED     FROM SPINE  . CYSTOURETHROSCOPY     right retrograde pyelogram,manipulate stone in the renal pelvis, rt. double-j catheter.  [name] Kitchen EYE SURGERY    . KNEE SURGERY     rt knee  . LEFT HEART CATH AND CORS/GRAFTS ANGIOGRAPHY N/A 02/20/2017   Procedure: LEFT HEART CATH AND CORS/GRAFTS ANGIOGRAPHY;  Surgeon: Leonie Man, MD;  Location: [place] CV LAB;  Service: Cardiovascular;  Laterality: N/A;  . LEFT HEART CATH AND CORS/GRAFTS ANGIOGRAPHY N/A 05/28/2018   Procedure: LEFT HEART CATH AND CORS/GRAFTS ANGIOGRAPHY;  Surgeon: Burnell Blanks, MD;  Location: [place] CV LAB;  Service: Cardiovascular;  Laterality: N/A;  . NEPHROLITHOTOMY     PERCUTANEOUS  . PARS PLANA VITRECTOMY     lt. eye,retinal photocoagulation lt. eye, membrane peel lt. eye.  [name] Kitchen PERCUTANEOUS STENT INTERVENTION Left 05/13/2014   Procedure: PERCUTANEOUS STENT INTERVENTION;  Surgeon: Wellington Hampshire, MD;  Location: [place] CATH LAB;  Service: Cardiovascular;  Laterality: Left;  COMMON ILLIAC  . re-exploratin of laminectomy  05/2008   (RT L2-3) WITH REDO MICRODISKECTOMY    Assessment & Plan Clinical Impression: Patient is a -year-old right-handed male with history of diabetes mellitus, CAD/CABG 2009, hypertension. Patient with recent admission 07/08/2018 to 07/11/2018 after recent fall sustaining a left acetabular fracture that was nonoperative followed by Dr. Edmonia Lynch orthopedic services touchdown weightbearing. Also completed CT angiogram of chest that was negative for pulmonary emboli. He was discharged home 07/11/2018 ambulating minimal guard using rolling walker maintaining weightbearing precautions. Per chart review patient lives with spouse independent prior to initial fall. 1 level home one-step to entry. Presented 07/20/2018 with headache and altered mental  status. No additional falls noted since most recent event. Cranial CT scan showed intraventricular hemorrhage involving bilateral lateral ventricles, left side greater than right. There was also concern for small amount of subarachnoid blood in the left frontal lobe. Neurosurgery Dr. Sherley Bounds consulted with conservative care with follow-up cranial CT scan unchanged no new site of hemorrhage. No hydrocephalus and no mass-effect. Patient had been on aspirin prior to admission remained on hold due to hemorrhage. Noted gradual hearing loss post fall recommendations have been made for ENT follow-up as outpatient. Patient with persistent confusion family had been cleared to stay with patient. Cranial CT scan completed 07/23/2018 showing no newacute changes.   Patient transferred to CIR on 07/23/2018 .   Patient currently requires mod with mobility secondary to muscle weakness and muscle joint tightness, decreased cardiorespiratoy endurance, decreased attention, decreased awareness, decreased problem solving, decreased safety awareness, decreased memory and delayed processing and decreased sitting balance, decreased standing balance, decreased balance strategies and difficulty maintaining precautions.  Prior to hospitalization, patient was modified independent  with mobility and lived with Spouse, Daughter in a House home.  Home access is 1 .  Patient will benefit from skilled PT intervention to maximize safe functional mobility, minimize  fall risk and decrease caregiver burden for planned discharge home with 24 hour supervision.  Anticipate patient will benefit from follow up [place] at discharge.  PT - End of Session Activity Tolerance: Tolerates 10 - 20 min activity with multiple rests Endurance Deficit: Yes PT Assessment Rehab Potential (ACUTE/IP ONLY): Good PT Barriers to Discharge: Decreased caregiver support;Home environment access/layout;Weight bearing restrictions;Medication compliance;Behavior PT  Patient demonstrates impairments in the following area(s): Balance;Behavior;Endurance;Motor;Pain;Perception;Safety;Sensory;Skin Integrity PT Transfers Functional Problem(s): Bed Mobility;Bed to Chair;Car;Furniture PT Locomotion Functional Problem(s): Ambulation;Wheelchair Mobility;Stairs PT Plan PT Intensity: Minimum of 1-2 x/day ,45 to 90 minutes PT Frequency: 5 out of 7 days PT Duration Estimated Length of Stay: 12-16 days  PT Treatment/Interventions: Ambulation/gait training;Discharge planning;Disease management/prevention;DME/adaptive equipment instruction;Functional electrical stimulation;Functional mobility training;Cognitive remediation/compensation;Balance/vestibular training;Neuromuscular re-education;Psychosocial support;Pain management;Patient/family education;Skin care/wound management;Splinting/orthotics;Stair training;Therapeutic Activities;Therapeutic Exercise;UE/LE Strength taining/ROM;UE/LE Coordination activities;Visual/perceptual remediation/compensation;Wheelchair propulsion/positioning PT Transfers Anticipated Outcome(s): Supervision assist with LRAD  PT Locomotion Anticipated Outcome(s): Ambulatory for household distances at supervision assist and LRAD  PT Recommendation Recommendations for Other Services: Speech consult;Neuropsych consult;Vestibular eval;Therapeutic Recreation consult Follow Up Recommendations: Home health PT Patient destination: Home Equipment Recommended: Rolling walker with 5" wheels;Wheelchair (measurements);Wheelchair cushion (measurements)  Skilled Therapeutic Intervention PT instructed patient in PT Evaluation and initiated treatment intervention; see below for results. PT educated patient in [place], rehab potential, rehab goals, and discharge recommendations. PT instructed pt in bed mobility, gait training, basic and car transfers with mod assist and WC mobility with min assist as listed below. Pt very limited by nausea and fatigue throughout treatment  session and unable to perform ar attempt stair. Pt returned to room and left sitting in [facility] with call bell in reach and all needs met.      PT Evaluation Precautions/Restrictions   PWB 50% on the LLE.  General   Vital Signs  Pain   5/10 head. RN aware Home Living/Prior Functioning Home Living Available Help at Discharge: Family;Available 24 hours/day Type of Home: House Home Access: Stairs to enter [employer] of Steps: 1 Entrance Stairs-Rails: None Home Layout: One level Bathroom Shower/Tub: Multimedia programmer: Standard Bathroom Accessibility: Yes  Lives With: Spouse;Daughter Prior Function Level of Independence: Independent with basic ADLs;Independent with homemaking with ambulation;Independent with gait;Independent with transfers Vision/Perception    [facility]. Reports wearing glasses Cognition Overall Cognitive Status: Impaired/Different from baseline Arousal/Alertness: Awake/alert Orientation Level: Oriented to person;Oriented to place;Disoriented to time;Disoriented to situation Attention: Sustained Sustained Attention: Impaired Sustained Attention Impairment: Verbal complex;Functional complex Memory: Impaired Memory Impairment: Decreased recall of new information Awareness: Impaired Awareness Impairment: Intellectual impairment Problem Solving: Impaired Problem Solving Impairment: Verbal basic;Functional basic Executive Function: (all impacted by lower level deficits) Sensation Sensation Light Touch: Appears Intact Stereognosis: Appears Intact Coordination Gross Motor Movements are Fluid and Coordinated: No Fine Motor Movements are Fluid and Coordinated: Yes Coordination and Movement Description: mild tremor on the L UE  Finger Nose Finger Test: mild tremor on the L UE. slow and deliberate.  Motor  Motor Motor: Other (comment) Motor - Skilled Clinical Observations: generalized weakness   Mobility Bed Mobility Bed Mobility: Supine to  Sit;Sit to Supine Supine to Sit: Minimal Assistance - Patient > 75% Sit to Supine: Moderate Assistance - Patient 50-74% Transfers Transfers: Sit to Stand;Stand Pivot Transfers Sit to Stand: Moderate Assistance - Patient 50-74% Stand Pivot Transfers: Moderate Assistance - Patient 50 - 74% Transfer (Assistive device): Rolling walker Locomotion  Gait Ambulation: Yes Gait Assistance: Moderate Assistance - Patient 50-74% Gait Distance (Feet): 15 Feet Assistive device: Rolling walker Gait  Assistance Details: Verbal cues for precautions/safety;Verbal cues for technique;Verbal cues for safe use of DME/AE;Verbal cues for gait pattern Gait Gait: Yes Gait Pattern: Impaired Gait Pattern: Antalgic Stairs / Additional Locomotion Stairs: No Wheelchair Mobility Wheelchair Mobility: Yes Wheelchair Assistance: Minimal assistance - Patient >75% Wheelchair Propulsion: Both upper extremities Wheelchair Parts Management: Needs assistance Distance: 150  Trunk/Postural Assessment  Cervical Assessment Cervical Assessment: Within Functional Limits Thoracic Assessment Thoracic Assessment: Exceptions to WFL(rounded shoulders. ) Lumbar Assessment Lumbar Assessment: Within Functional Limits Postural Control Postural Control: Within Functional Limits  Balance Balance Balance Assessed: Yes Dynamic Sitting Balance Dynamic Sitting - Balance Support: During functional activity Dynamic Sitting - Level of Assistance: 4: Min assist Dynamic Standing Balance Dynamic Standing - Balance Support: During functional activity Dynamic Standing - Level of Assistance: 4: Min assist;3: Mod assist Extremity Assessment      RLE Assessment RLE Assessment: Within Functional Limits General Strength Comments: grossly 5/5 to 4+/5 proximal to distal LLE Assessment LLE Assessment: Exceptions to [facility] General Strength Comments: grossly 5/5 knee extension. all others grossly 4/5 to 4+/5     Refer to Care Plan for Long  Term Goals  Recommendations for other services: Therapeutic Recreation  Stress management  Discharge Criteria: Patient will be discharged from PT if patient refuses treatment 3 consecutive times without medical reason, if treatment goals not met, if there is a change in medical status, if patient makes no progress towards goals or if patient is discharged from hospital.  The above assessment, treatment plan, treatment alternatives and goals were discussed and mutually agreed upon: by patient  [name] 07/24/2018, 12:25 PM

## 2018-07-24 NOTE — Evaluation (Signed)
Occupational Therapy Assessment and Plan  Patient Details  Name: Ricardo Le MRN: [id number] Date of Birth: 1940/12/05  OT Diagnosis: acute pain, cognitive deficits and muscle weakness (generalized) Rehab Potential: Rehab Potential (ACUTE ONLY): Good ELOS: 14 days   Today's Date: 07/24/2018 OT Individual Time: 0915 - 1026     71 minutes of skilled OT intervention  Problem List:  Patient Active Problem List   Diagnosis Date Noted  . Traumatic intraventricular hemorrhage ([place]) 07/23/2018  . Intraventricular hemorrhage ([place]) 07/20/2018  . Fracture of left acetabulum ([place]) 07/09/2018  . Elevated troponin   . Coronary artery disease involving native coronary artery of native heart with unstable angina pectoris ([place])   . Chest pain 02/20/2017  . Precordial chest pain   . Angina pectoris ([place]) 02/19/2017  . Atherosclerotic heart disease of native coronary artery with unstable angina pectoris ([place]) 03/08/2015  . Hyperlipidemia 01/26/2015  . Cervical spondylosis without myelopathy 07/28/2014  . Cervical spondylosis 07/28/2014  . Constipation 05/19/2014  . Pain 05/18/2014  . PAD (peripheral artery disease) ([place]) 03/20/2012  . Unstable angina ([place]) 05/05/2011  . DDD (degenerative disc disease)   . HNP (herniated nucleus pulposus), lumbar   . Pancreatic mass   . Nephrolithiasis   . Carotid bruit   . S/P CABG x 4 03/08/2011  . Bruit 07/06/2010  . NONSPECIFIC ABN FINDING RAD & OTH EXAM GI TRACT 06/09/2009  . NAUSEA ALONE 05/28/2009  . CHANGE IN BOWELS 05/28/2009  . Elevated lipids 05/27/2009  . Abdominal pain, generalized 05/27/2009  . Type 2 diabetes mellitus with vascular disease ([place]) 12/16/2008  . Essential hypertension 12/16/2008  . BACK PAIN 12/16/2008  . Palpitations 12/16/2008    Past Medical History:  Past Medical History:  Diagnosis Date  . CAD (coronary artery disease)    a. 04/2007 s/p CABG x 4  - LIMA->LAD, VG->OM1->Om2, VG->PDA;  b. 12/2008 & 06/2010 Caths -  Native multivessel dzs with 4/4 patent grafts. c. cath 03/09/2015 patent grafts, unchanged from prior cath.  . Cancer [facility])    pancreatic  March 2011  . Carotid bruit    a. 07/2010 U/S- 0-39% bilat ICA stenosis  . Chest pain    Noncardiac probably related to reflux  . DDD (degenerative disc disease)    several surgeries  . Fracture acetabulum-closed ([place]) 07/08/2018  . GERD (gastroesophageal reflux disease)   . HNP (herniated nucleus pulposus), lumbar    a. L2-3, s/p laminotomy, microdiskectomy 02/2009  . HOH (hard of hearing)    uses amplifiers   . Nephrolithiasis   . PAD (peripheral artery disease) ([place]) 04/2014   s/p L Common Iliac Stent  . Palpitations   . 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 Pneumonia    hx of  pna  . Pure hypercholesterolemia    statin intolerance  . Type II or unspecified type diabetes mellitus without mention of complication, not stated as uncontrolled    insulin dependent  . Unspecified essential hypertension    Past Surgical History:  Past Surgical History:  Procedure Laterality Date  . ABDOMINAL AORTAGRAM N/A 05/13/2014   Procedure: ABDOMINAL [name];  Surgeon: Wellington Hampshire, MD;  Location: [place] CATH LAB;  Service: Cardiovascular;  Laterality: N/A;  . ANTERIOR CERVICAL DECOMP/DISCECTOMY FUSION N/A 07/28/2014   Procedure: ANTERIOR CERVICAL DECOMPRESSION/DISCECTOMY FUSION CERVICAL FOUR-FIVE CERVICAL FIVE-SIX ;  Surgeon: Earnie Larsson, MD;  Location: [place] NEURO ORS;  Service: Neurosurgery;  Laterality: N/A;  . APPENDECTOMY    .  arthroscopy right knee    . BACK SURGERY    . CARDIAC CATHETERIZATION N/A 03/09/2015   Procedure: Left Heart Cath and Cors/Grafts Angiography;  Surgeon: [name] Sine, MD;  Location: [place] CV LAB;  Service: Cardiovascular;  Laterality: N/A;  . CHOLECYSTECTOMY    . CORONARY ARTERY BYPASS GRAFT     X 4 2009 (LIMA to LAD, SVG to first cicumflex marginal branch with sequential SVG to  second cicumflex marginal branch  and saphenous vein to posterior descending coronary artery with endoscopic,vein harvest rt. lower exttremity by Dr.Owen on March 31,2009  . CYST REMOVED     FROM SPINE  . CYSTOURETHROSCOPY     right retrograde pyelogram,manipulate stone in the renal pelvis, rt. double-j catheter.  [name] Kitchen EYE SURGERY    . KNEE SURGERY     rt knee  . LEFT HEART CATH AND CORS/GRAFTS ANGIOGRAPHY N/A 02/20/2017   Procedure: LEFT HEART CATH AND CORS/GRAFTS ANGIOGRAPHY;  Surgeon: Leonie Man, MD;  Location: [place] CV LAB;  Service: Cardiovascular;  Laterality: N/A;  . LEFT HEART CATH AND CORS/GRAFTS ANGIOGRAPHY N/A 05/28/2018   Procedure: LEFT HEART CATH AND CORS/GRAFTS ANGIOGRAPHY;  Surgeon: Burnell Blanks, MD;  Location: [place] CV LAB;  Service: Cardiovascular;  Laterality: N/A;  . NEPHROLITHOTOMY     PERCUTANEOUS  . PARS PLANA VITRECTOMY     lt. eye,retinal photocoagulation lt. eye, membrane peel lt. eye.  [name] Kitchen PERCUTANEOUS STENT INTERVENTION Left 05/13/2014   Procedure: PERCUTANEOUS STENT INTERVENTION;  Surgeon: Wellington Hampshire, MD;  Location: [place] CATH LAB;  Service: Cardiovascular;  Laterality: Left;  COMMON ILLIAC  . re-exploratin of laminectomy  05/2008   (RT L2-3) WITH REDO MICRODISKECTOMY    Assessment & Plan Clinical Impression: Patient is a 78 y.o. year old male with history of diabetes mellitus, CAD/CABG 2009, hypertension. Patient with recent admission 07/08/2018 to 07/11/2018 after recent fall sustaining a left acetabular fracture that was nonoperative followed by Dr. Edmonia Lynch orthopedic services touchdown weightbearing. Also completed CT angiogram of chest that was negative for pulmonary emboli. He was discharged home 07/11/2018 ambulating minimal guard using rolling walker maintaining weightbearing precautions. Per chart review patient lives with spouse independent prior to initial fall. 1 level home one-step to entry. Presented 07/20/2018 with  headache and altered mental status. No additional falls noted since most recent event. Cranial CT scan showed intraventricular hemorrhage involving bilateral lateral ventricles, left side greater than right. There was also concern for small amount of subarachnoid blood in the left frontal lobe. Neurosurgery Dr. Sherley Bounds consulted with conservative care with follow-up cranial CT scan unchanged no new site of hemorrhage. No hydrocephalus and no mass-effect. Patient had been on aspirin prior to admission remained on hold due to hemorrhage. Noted gradual hearing loss post fall recommendations have been made for ENT follow-up as outpatient. Patient with persistent confusion family had been cleared to stay with patient. Cranial CT scan completed 07/23/2018 showing no newacute changes. Patient transferred to CIR on 07/23/2018 .    Patient currently requires max with basic self-care skills secondary to muscle weakness, decreased cardiorespiratoy endurance, decreased coordination and decreased motor planning, perseveration, decreased initiation, decreased attention, decreased awareness, decreased problem solving, decreased safety awareness and decreased memory and decreased sitting balance, decreased standing balance, decreased postural control and decreased balance strategies.  Prior to hospitalization, patient could complete ADLs and IADLs with independent .  Patient will benefit from skilled intervention to decrease level of assist with basic self-care skills prior to discharge  home with care partner.  Anticipate patient will require 24 hour supervision and follow up home health.  OT - End of Session Activity Tolerance: Decreased this session Endurance Deficit: Yes OT Assessment Rehab Potential (ACUTE ONLY): Good OT Barriers to Discharge: Behavior OT Barriers to Discharge Comments: cognition OT Patient demonstrates impairments in the following area(s):  Balance;Behavior;Cognition;Endurance;Motor;Pain;Safety OT Basic ADL's Functional Problem(s): Grooming;Bathing;Dressing;Toileting OT Transfers Functional Problem(s): Toilet;Tub/Shower OT Additional Impairment(s): None OT Plan OT Intensity: Minimum of 1-2 x/day, 45 to 90 minutes OT Frequency: 5 out of 7 days OT Duration/Estimated Length of Stay: 14 days OT Treatment/Interventions: Balance/vestibular training;Disease mangement/prevention;Neuromuscular re-education;Self Care/advanced ADL retraining;Therapeutic Exercise;Cognitive remediation/compensation;Pain management;DME/adaptive equipment instruction;UE/LE Strength taining/ROM;Skin care/wound managment;Community reintegration;Patient/family education;UE/LE Coordination activities;Discharge planning;Functional mobility training;Psychosocial support;Therapeutic Activities OT Self Feeding Anticipated Outcome(s): S OT Basic Self-Care Anticipated Outcome(s): S OT Toileting Anticipated Outcome(s): S OT Bathroom Transfers Anticipated Outcome(s): S OT Recommendation Recommendations for Other Services: Other (comment)(none at this time) Patient destination: Home Follow Up Recommendations: Home health OT;24 hour supervision/assistance Equipment Recommended: To be determined   Skilled Therapeutic Intervention Upon entering the room, pt supine in bed with daughter, Lynelle Smoke, present in room. OT educated pt and caregiver on OT purpose, POC , and goals with caregiver verbalizing understanding. OT also educating caregiver to allow pt to attempt tasks on him own during evaluation process and that therapist would be providing intervention as needed for safety and success. Pt very HOH and needing increased time and multimodal cuing for tasks. Pt standing from bed with  Mod lifting assistance and transferring to [facility] with mod A overall and use of RW. Pt attempting to have BM while seated and OT provided pt with items needed to wash self while seated on commode. Pt  perseverating on washing face and needing hand over hand assistance to redirect. Pt standing and OT assisted with hygiene. Pt with c/o stomach pain and needing increased assistance secondary to pain with dressing. Pt returning to bed at end of session and asleep within minutes secondary to fatigue. OT spending this time discussing rehab process further and answering questions she had. Bed alarm activated and all needs within reach.   OT Evaluation Precautions/Restrictions  Precautions Precautions: Fall Restrictions Weight Bearing Restrictions: Yes LLE Weight Bearing: Partial weight bearing LLE Partial Weight Bearing Percentage or Pounds: until  6/24 and then WBAT Pain Pain Assessment Pain Scale: Faces Faces Pain Scale: Hurts even more Pain Type: Chronic pain Pain Location: Other (Comment)(stomach) Pain Orientation: Mid Pain Descriptors / Indicators: Aching;Cramping Pain Onset: On-going Patients Stated Pain Goal: 2 Pain Intervention(s): Repositioned;Medication (See eMAR) Home Living/Prior [place] expects to be discharged to:: Private residence Living Arrangements: Spouse/significant other, Children Available Help at Discharge: Family, Available 24 hours/day Type of Home: House Home Access: Stairs to enter Technical brewer of Steps: 1 Entrance Stairs-Rails: None Home Layout: One level Bathroom Shower/Tub: Multimedia programmer: Programmer, systems: Yes  Lives With: Spouse, Daughter Prior Function Level of Independence: Independent with basic ADLs, Independent with homemaking with ambulation, Independent with gait, Independent with transfers Vision Baseline Vision/History: Wears glasses Patient Visual Report: No change from baseline Cognition Overall Cognitive Status: Impaired/Different from baseline Arousal/Alertness: Awake/alert Orientation Level: Person Person: Oriented Place: Disoriented Situation:  Disoriented Year: 2018 Month: June Day of Week: Incorrect Memory: Impaired Memory Impairment: Decreased recall of new information Immediate Memory Recall: Sock;Blue Memory Recall: (0/3) Sensation Sensation Light Touch: Appears Intact Hot/Cold: Appears Intact Proprioception: Not tested Stereognosis: Appears Intact Coordination Gross Motor Movements are Fluid and Coordinated:  No Fine Motor Movements are Fluid and Coordinated: Yes Coordination and Movement Description: mild tremor on the L UE  Finger Nose Finger Test: mild tremor on the L UE. slow and deliberate.  Motor  Motor Motor: Other (comment) Motor - Skilled Clinical Observations: generalized weakness  Mobility  Bed Mobility Bed Mobility: Supine to Sit;Sit to Supine Supine to Sit: Minimal Assistance - Patient > 75% Sit to Supine: Moderate Assistance - Patient 50-74% Transfers Sit to Stand: Moderate Assistance - Patient 50-74%  Trunk/Postural Assessment  Cervical Assessment Cervical Assessment: Within Functional Limits Thoracic Assessment Thoracic Assessment: Exceptions to [facility] Lumbar Assessment Lumbar Assessment: Within Functional Limits Postural Control Postural Control: Within Functional Limits  Balance Balance Balance Assessed: Yes Dynamic Sitting Balance Dynamic Sitting - Balance Support: During functional activity Dynamic Sitting - Level of Assistance: 4: Min assist Dynamic Standing Balance Dynamic Standing - Balance Support: During functional activity Dynamic Standing - Level of Assistance: 4: Min assist;3: Mod assist Extremity/Trunk Assessment RUE Assessment RUE Assessment: Within Functional Limits LUE Assessment LUE Assessment: Within Functional Limits     Refer to Care Plan for Long Term Goals  Recommendations for other services: None    Discharge Criteria: Patient will be discharged from OT if patient refuses treatment 3 consecutive times without medical reason, if treatment goals not met, if  there is a change in medical status, if patient makes no progress towards goals or if patient is discharged from hospital.  The above assessment, treatment plan, treatment alternatives and goals were discussed and mutually agreed upon: by patient and by family  Gypsy Decant 07/24/2018, 12:50 PM

## 2018-07-24 NOTE — IPOC Note (Signed)
Overall Plan of Care [facility]) Patient Details Name: Ricardo Le MRN: [id number] DOB: 1940-12-20  Admitting Diagnosis: <principal problem not specified>  Hospital Problems: Active Problems:   Traumatic intraventricular hemorrhage [facility])     Functional Problem List: Nursing Bladder, Bowel, Medication Management, Endurance, Skin Integrity, Safety, Pain  PT Balance, Behavior, Endurance, Motor, Pain, Perception, Safety, Sensory, Skin Integrity  OT Balance, Behavior, Cognition, Endurance, Motor, Pain, Safety  SLP Cognition  TR         Basic ADL's: OT Grooming, Bathing, Dressing, Toileting     Advanced  ADL's: OT       Transfers: PT Bed Mobility, Bed to Chair, Car, Manufacturing systems engineer, Metallurgist: PT Ambulation, Emergency planning/management officer, Stairs     Additional Impairments: OT None  SLP Social Cognition   Problem Solving, Memory, Attention, Awareness  TR      Anticipated Outcomes Item Anticipated Outcome  Self Feeding S  Swallowing      Basic self-care  S  Toileting  S   Bathroom Transfers S  Bowel/Bladder  Pt will manage bowel and bladder with min assist   Transfers  Supervision assist with LRAD   Locomotion  Ambulatory for household distances at supervision assist and LRAD   Communication     Cognition  Supervision  Pain  Pt will manage pain at 3 or less on a scale of 0-10.   Safety/Judgment  Pt will remain free of falls with injury with min cues/assist    Therapy Plan: PT Intensity: Minimum of 1-2 x/day ,45 to 90 minutes PT Frequency: 5 out of 7 days PT Duration Estimated Length of Stay: 12-16 days  OT Intensity: Minimum of 1-2 x/day, 45 to 90 minutes OT Frequency: 5 out of 7 days OT Duration/Estimated Length of Stay: 14 days SLP Intensity: Minumum of 1-2 x/day, 30 to 90 minutes SLP Frequency: 3 to 5 out of 7 days SLP Duration/Estimated Length of Stay: 2 weeks   Due to the current state of emergency, patients may not be receiving  their 3-hours of Medicare-mandated therapy.   Team Interventions: Nursing Interventions Patient/Family Education, Bladder Management, Bowel Management, Discharge Planning, Skin Care/Wound Management, Disease Management/Prevention, Pain Management, Cognitive Remediation/Compensation  PT interventions Ambulation/gait training, Discharge planning, Disease management/prevention, DME/adaptive equipment instruction, Functional electrical stimulation, Functional mobility training, Cognitive remediation/compensation, Balance/vestibular training, Neuromuscular re-education, Psychosocial support, Pain management, Patient/family education, Skin care/wound management, Splinting/orthotics, Stair training, Therapeutic Activities, Therapeutic Exercise, UE/LE Strength taining/ROM, UE/LE Coordination activities, Visual/perceptual remediation/compensation, Wheelchair propulsion/positioning  OT Interventions Balance/vestibular training, Disease mangement/prevention, Neuromuscular re-education, Self Care/advanced ADL retraining, Therapeutic Exercise, Cognitive remediation/compensation, Pain management, DME/adaptive equipment instruction, UE/LE Strength taining/ROM, Skin care/wound managment, Community reintegration, Patient/family education, UE/LE Coordination activities, Discharge planning, Functional mobility training, Psychosocial support, Therapeutic Activities  SLP Interventions Cognitive remediation/compensation, Internal/external aids, Patient/family education  TR Interventions    SW/CM Interventions Discharge Planning, Psychosocial Support, Patient/Family Education   Barriers to Discharge MD  Medical stability and Lack of/limited family support  Nursing Medical stability    PT Decreased caregiver support, Home environment access/layout, Weight bearing restrictions, Medication compliance, Behavior    OT Behavior cognition  SLP      SW       Team Discharge Planning: Destination: PT-Home ,OT- Home ,  SLP-Home Projected Follow-up: PT-Home health PT, OT-  Home health OT, 24 hour supervision/assistance, SLP-24 hour supervision/assistance Projected Equipment Needs: PT-Rolling walker with 5" wheels, Wheelchair (measurements), Wheelchair cushion (measurements), OT- To be determined, SLP-None recommended by SLP Equipment  Details: PT- , OT-  Patient/family involved in discharge planning: PT- Patient, Family member/caregiver,  OT-Patient, Family member/caregiver, SLP-Patient, Family member/caregiver  MD ELOS: 2-3wks Medical Rehab Prognosis:  Good Assessment:  78 year old right-handed male with history of diabetes mellitus, CAD/CABG 2009, hypertension. Patient with recent admission 07/08/2018 to 07/11/2018 after recent fall sustaining a left acetabular fracture that was nonoperative followed by Dr. Edmonia Lynch orthopedic services touchdown weightbearing. Also completed CT angiogram of chest that was negative for pulmonary emboli. He was discharged home 07/11/2018 ambulating minimal guard using rolling walker maintaining weightbearing precautions. Per chart review patient lives with spouse independent prior to initial fall. 1 level home one-step to entry. Presented 07/20/2018 with headache and altered mental status. No additional falls noted since most recent event. Cranial CT scan showed intraventricular hemorrhage involving bilateral lateral ventricles, left side greater than right. There was also concern for small amount of subarachnoid blood in the left frontal lobe. Neurosurgery Dr. Sherley Bounds consulted with conservative care with follow-up cranial CT scan unchanged no new site of hemorrhage. No hydrocephalus and no mass-effect. Patient had been on aspirin prior to admission remained on hold due to hemorrhage. Noted gradual hearing loss post fall recommendations have been made for ENT follow-up as outpatient. Patient with persistent confusion family had been cleared to stay with patient. Cranial  CT scan completed 07/23/2018 showing no newacute changes. Tolerating a regular consistency diet   Now requiring 24/7 Rehab RN,MD, as well as CIR level PT, OT and SLP.  Treatment team will focus on ADLs and mobility with goals set at Supervision See Team Conference Notes for weekly updates to the plan of care

## 2018-07-24 NOTE — Telephone Encounter (Signed)
Spoke with Ricardo Le and it was recommended to hold the Ranexa due to side effects 07/15/18 and use SL nitro but now he is an inpatient after falling off a ladder and he has a brain bleed... she is worried that the hosp is just giving it to him because it was on his med list.. I urged her to talk with the MD/ Nurse that is taking care of him in the hosp currently and they can go over each of his meds with her.

## 2018-07-24 NOTE — Evaluation (Signed)
Speech Language Pathology Assessment and Plan  Patient Details  Name: Ricardo Le MRN: [id number] Date of Birth: 1940/09/22  SLP Diagnosis: Cognitive Impairments  Rehab Potential: Good ELOS: 2 weeks    Today's Date: 07/24/2018 SLP Individual Time: 1200-1300 SLP Individual Time Calculation (min): 60 min   Problem List:  Patient Active Problem List   Diagnosis Date Noted  . Traumatic intraventricular hemorrhage ([place]) 07/23/2018  . Intraventricular hemorrhage ([place]) 07/20/2018  . Fracture of left acetabulum ([place]) 07/09/2018  . Elevated troponin   . Coronary artery disease involving native coronary artery of native heart with unstable angina pectoris ([place])   . Chest pain 02/20/2017  . Precordial chest pain   . Angina pectoris ([place]) 02/19/2017  . Atherosclerotic heart disease of native coronary artery with unstable angina pectoris ([place]) 03/08/2015  . Hyperlipidemia 01/26/2015  . Cervical spondylosis without myelopathy 07/28/2014  . Cervical spondylosis 07/28/2014  . Constipation 05/19/2014  . Pain 05/18/2014  . PAD (peripheral artery disease) ([place]) 03/20/2012  . Unstable angina ([place]) 05/05/2011  . DDD (degenerative disc disease)   . HNP (herniated nucleus pulposus), lumbar   . Pancreatic mass   . Nephrolithiasis   . Carotid bruit   . S/P CABG x 4 03/08/2011  . Bruit 07/06/2010  . NONSPECIFIC ABN FINDING RAD & OTH EXAM GI TRACT 06/09/2009  . NAUSEA ALONE 05/28/2009  . CHANGE IN BOWELS 05/28/2009  . Elevated lipids 05/27/2009  . Abdominal pain, generalized 05/27/2009  . Type 2 diabetes mellitus with vascular disease ([place]) 12/16/2008  . Essential hypertension 12/16/2008  . BACK PAIN 12/16/2008  . Palpitations 12/16/2008   Past Medical History:  Past Medical History:  Diagnosis Date  . CAD (coronary artery disease)    a. 04/2007 s/p CABG x 4  - LIMA->LAD, VG->OM1->Om2, VG->PDA;  b. 12/2008 & 06/2010 Caths - Native multivessel dzs with 4/4 patent grafts. c. cath  03/09/2015 patent grafts, unchanged from prior cath.  . Cancer [facility])    pancreatic  March 2011  . Carotid bruit    a. 07/2010 U/S- 0-39% bilat ICA stenosis  . Chest pain    Noncardiac probably related to reflux  . DDD (degenerative disc disease)    several surgeries  . Fracture acetabulum-closed ([place]) 07/08/2018  . GERD (gastroesophageal reflux disease)   . HNP (herniated nucleus pulposus), lumbar    a. L2-3, s/p laminotomy, microdiskectomy 02/2009  . HOH (hard of hearing)    uses amplifiers   . Nephrolithiasis   . PAD (peripheral artery disease) ([place]) 04/2014   s/p L Common Iliac Stent  . Palpitations   . 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 Pneumonia    hx of  pna  . Pure hypercholesterolemia    statin intolerance  . Type II or unspecified type diabetes mellitus without mention of complication, not stated as uncontrolled    insulin dependent  . Unspecified essential hypertension    Past Surgical History:  Past Surgical History:  Procedure Laterality Date  . ABDOMINAL AORTAGRAM N/A 05/13/2014   Procedure: ABDOMINAL [name];  Surgeon: Wellington Hampshire, MD;  Location: [place] CATH LAB;  Service: Cardiovascular;  Laterality: N/A;  . ANTERIOR CERVICAL DECOMP/DISCECTOMY FUSION N/A 07/28/2014   Procedure: ANTERIOR CERVICAL DECOMPRESSION/DISCECTOMY FUSION CERVICAL FOUR-FIVE CERVICAL FIVE-SIX ;  Surgeon: Earnie Larsson, MD;  Location: [place] NEURO ORS;  Service: Neurosurgery;  Laterality: N/A;  . APPENDECTOMY    . arthroscopy right knee    . BACK  SURGERY    . CARDIAC CATHETERIZATION N/A 03/09/2015   Procedure: Left Heart Cath and Cors/Grafts Angiography;  Surgeon: [name] Sine, MD;  Location: [place] [facility] CV LAB;  Service: Cardiovascular;  Laterality: N/A;  . CHOLECYSTECTOMY    . CORONARY ARTERY BYPASS GRAFT     X 4 2009 (LIMA to LAD, SVG to first cicumflex marginal branch with sequential SVG to second cicumflex marginal branch  and saphenous vein to  posterior descending coronary artery with endoscopic,vein harvest rt. lower exttremity by Dr.Owen on March 31,2009  . CYST REMOVED     FROM SPINE  . CYSTOURETHROSCOPY     right retrograde pyelogram,manipulate stone in the renal pelvis, rt. double-j catheter.  [name] Kitchen EYE SURGERY    . KNEE SURGERY     rt knee  . LEFT HEART CATH AND CORS/GRAFTS ANGIOGRAPHY N/A 02/20/2017   Procedure: LEFT HEART CATH AND CORS/GRAFTS ANGIOGRAPHY;  Surgeon: Leonie Man, MD;  Location: [facility] CV LAB;  Service: Cardiovascular;  Laterality: N/A;  . LEFT HEART CATH AND CORS/GRAFTS ANGIOGRAPHY N/A 05/28/2018   Procedure: LEFT HEART CATH AND CORS/GRAFTS ANGIOGRAPHY;  Surgeon: Burnell Blanks, MD;  Location: [place] CV LAB;  Service: Cardiovascular;  Laterality: N/A;  . NEPHROLITHOTOMY     PERCUTANEOUS  . PARS PLANA VITRECTOMY     lt. eye,retinal photocoagulation lt. eye, membrane peel lt. eye.  [name] Kitchen PERCUTANEOUS STENT INTERVENTION Left 05/13/2014   Procedure: PERCUTANEOUS STENT INTERVENTION;  Surgeon: Wellington Hampshire, MD;  Location: [facility] CATH LAB;  Service: Cardiovascular;  Laterality: Left;  COMMON ILLIAC  . re-exploratin of laminectomy  05/2008   (RT L2-3) WITH REDO MICRODISKECTOMY    Assessment / Plan / Recommendation Clinical Impression Ricardo Le is a 78 year old right-handed male with history of diabetes mellitus, CAD/CABG 2009, hypertension.  Patient with recent admission 07/08/2018 to 07/11/2018 after fall sustaining a left acetabular fracture that was nonoperative. He was discharged home 07/11/2018 ambulating minimal guard using rolling walker maintaining weightbearing precautions. Presented 07/20/2018 with headache and altered mental status.  No additional falls noted since most recent event.  Cranial CT scan showed intraventricular hemorrhage involving bilateral lateral ventricles, left side greater than right.  There was also concern for small amount of subarachnoid blood in the left frontal lobe.   Neurosurgery Dr. Sherley Bounds consulted with conservative care with follow-up cranial CT scan unchanged no new site of hemorrhage.  No hydrocephalus and no mass-effect.  Noted gradual hearing loss post fall recommendations have been made for ENT follow-up as outpatient.  Patient with persistent confusion Cranial CT scan completed 07/23/2018 showing no new acute changes.  Tolerating a regular consistency diet.  Therapy evaluations completed and patient was admitted for a comprehensive rehab program on 07/23/18.   Pt continues to present with overall confusion. In addition, cognitive function is impacted by worsening hearing loss, overall fatigue/malaise and internal distractions related to being cold and his stomach hurting him. Off-topic questions/comments could be related to decreased hearing. He likely did not hear his daighter and I talking, therefore he didn't know that he was interrupting or asking a question that she and I had just discussed (eve when we spoke very loudly). He demonstrates deficits in awareness, orientation, problem solving and overall retention of new information. Skilled ST is required to target the above mentioned deficits, increase pt's functional independence and reduce caregiver burden.    Skilled Therapeutic Interventions          Skilled treatment session focused on completion of above  mentioned evaluations. Pt's daughter present and all questions answered to her satisfaction. POC reviewed with pt and his daughter, both in agreement.   SLP Assessment  Patient will need skilled [place] [facility] during CIR admission    Recommendations  Patient destination: Home Follow up Recommendations: 24 hour supervision/assistance Equipment Recommended: None recommended by SLP    SLP Frequency 3 to 5 out of 7 days   SLP Duration  SLP Intensity  SLP Treatment/Interventions 2 weeks  Minumum of 1-2 x/day, 30 to 90 minutes  Cognitive  remediation/compensation;Internal/external aids;Patient/family education    Pain Pain Assessment Pain Scale: Faces Faces Pain Scale: Hurts even more Pain Type: Chronic pain Pain Location: Other (Comment)(stomach) Pain Orientation: Mid Pain Descriptors / Indicators: Aching;Cramping Pain Onset: On-going Patients Stated Pain Goal: 2 Pain Intervention(s): Repositioned;Medication (See eMAR)  Prior Functioning Cognitive/Linguistic Baseline: Within functional limits Type of Home: House  Lives With: Spouse;Daughter Available Help at Discharge: Family;Available 24 hours/day Vocation: Retired  Industrial/product designer Term Goals: Week 1: SLP Short Term Goal 1 (Week 1): Pt will utilize external memory aids to recall orientation information with Min A cues.  SLP Short Term Goal 2 (Week 1): Pt will complete basic familiar problem solving tasks with Min A cues.  SLP Short Term Goal 3 (Week 1): Pt will demonstrate selective atteniton to task for ! 5 minutes with Min A cues.  SLP Short Term Goal 4 (Week 1): Pt will demonstrate intellectual awareness by identifyin 2 physical and 2 cognitive deficits related to acute hospitalization with Mod A cues.   Refer to Care Plan for Long Term Goals  Recommendations for other services: Neuropsych  Discharge Criteria: Patient will be discharged from SLP if patient refuses treatment 3 consecutive times without medical reason, if treatment goals not met, if there is a change in medical status, if patient makes no progress towards goals or if patient is discharged from hospital.  The above assessment, treatment plan, treatment alternatives and goals were discussed and mutually agreed upon: by patient and by family  Lezlee Gills 07/24/2018, 2:13 PM

## 2018-07-24 NOTE — Progress Notes (Signed)
Patient information reviewed and entered into eRehab System by Becky Tiwanna Tuch, PPS coordinator. Information including medical coding, function ability, and quality indicators will be reviewed and updated through discharge.   

## 2018-07-24 NOTE — Progress Notes (Signed)
Initial Nutrition Assessment  RD working remotely.  DOCUMENTATION CODES:   Not applicable  INTERVENTION:   -Continue MVI with minerals daily -Increase Ensure Enlive po TID, each supplement provides 350 kcal and 20 grams of protein -30 ml Prostat TID with meals, each supplement provides 100 kcals and 15 grams protein  NUTRITION DIAGNOSIS:   Increased nutrient needs related to wound healing as evidenced by estimated needs.  GOAL:   Patient will meet greater than or equal to 90% of their needs  MONITOR:   PO intake, Supplement acceptance, Weight trends, Labs, Skin, I & O's  REASON FOR ASSESSMENT:   Malnutrition Screening Tool, Consult Assessment of nutrition requirement/status, Poor PO  ASSESSMENT:   78 year old right-handed male with history of diabetes mellitus, CAD/CABG 2009, hypertension.  Patient with recent admission 07/08/2018 to 07/11/2018 after recent fall sustaining a left acetabular fracture that was non operative followed by Dr. Edmonia Lynch orthopedic services touchdown weightbearing.  Also completed CT angiogram of chest that was negative for pulmonary emboli.  He was discharged home 07/11/2018 ambulating minimal guard using rolling walker maintaining weightbearing precautions.  Presented 07/20/2018 with headache and altered mental status.  No additional falls noted since most recent event.  Cranial CT scan showed intraventricular hemorrhage involving bilateral lateral ventricles, left side greater than right.  There was also concern for small amount of subarachnoid blood in the left frontal lobe.  Neurosurgery Dr. Sherley Bounds consulted with conservative care with follow-up cranial CT scan unchanged no new site of hemorrhage.  No hydrocephalus and no mass-effect.  Patient had been on aspirin prior to admission remained on hold due to hemorrhage.  Noted gradual hearing loss post fall recommendations have been made for ENT follow-up as outpatient.  Patient with persistent  confusion family had been cleared to stay with patient.  Cranial CT scan completed 07/23/2018 showing no new acute changes.  Tolerating a regular consistency diet.  Weight bearing restrictions progressed to 50 % WB for two weeks then full weight bearing. Patient with history of stomach issues and with nausea and head ache on day of admission.  Pt admitted to CIR with diagnosis of ICH and acetabular fx.   Reviewed I/O's: -140 ml x 24 hours  UOP: 140 ml x 24 hours  RD attempted to speak with pt via phone x 2, however, unable to reach (ensured that pt was not in scheduled therapy sessions on both attempts). RD unable to obtain further nutrition-related history at this time.   Pt with poor oral intake. Noted documented meal completion only 20%.   Reviewed wt hx; noted pt has experienced a 5.2% wt loss over the past month, which is significant for time frame.  Pt is at high risk for malnutrition due to cognitive deficits, decreased oral intake, and increased nutritional needs for wound healing. However, unable to identify malnutrition without further history and completion of nutrition-focused physical exam.   Lab Results  Component Value Date   HGBA1C 7.9 (H) 07/21/2018  PTA DM medications are .10 mg glipizide BID and 15 units insulin glargine q HS. Per ADA's Standards of Medical Care of Diabetes, glycemic targets for older adults who have multiple co-morbidities, cognitive impairments, and functional dependence should be less stringent (Hgb A1c <8.0-8.5). Pt with poor oral intake and would benefit from nutrient dense supplement. One Ensure Enlive supplement provides 350 kcals, 20 grams protein, and 44-45 grams of carbohydrate vs one Glucerna shake supplement, which provides 220 kcals, 10 grams of protein, and 26 grams of carbohydrate.  Given pt's hx of DM, RD will continue to monitor PO intake, CBGS, and adjust supplement regimen as appropriate.   Labs reviewed: Na: 129,CBGS: 124-136 (inpatient orders  for glycemic control are 0-15 units insulin aspart TID with meals and 18 units insulin glargine q HS).   NUTRITION - FOCUSED PHYSICAL EXAM:    Most Recent Value  Orbital Region  Unable to assess  Upper Arm Region  Unable to assess  Thoracic and Lumbar Region  Unable to assess  Buccal Region  Unable to assess  Temple Region  Unable to assess  Clavicle Bone Region  Unable to assess  Clavicle and Acromion Bone Region  Unable to assess  Scapular Bone Region  Unable to assess  Dorsal Hand  Unable to assess  Patellar Region  Unable to assess  Anterior Thigh Region  Unable to assess  Posterior Calf Region  Unable to assess  Edema (RD Assessment)  Unable to assess  Hair  Unable to assess  Eyes  Unable to assess  Mouth  Unable to assess  Skin  Unable to assess  Nails  Unable to assess       Diet Order:   Diet Order            Diet Carb Modified Fluid consistency: Thin; Room service appropriate? Yes with Assist  Diet effective now              EDUCATION NEEDS:   No education needs have been identified at this time  Skin:  Skin Assessment: Skin Integrity Issues: Skin Integrity Issues:: Stage I, Stage II Stage I: rt and lt heels Stage II: sacrum  Last BM:  07/20/18  Height:   Ht Readings from Last 1 Encounters:  07/23/18 5\' 5"  (1.651 m)    Weight:   Wt Readings from Last 1 Encounters:  07/23/18 67.1 kg    Ideal Body Weight:  61.8 kg  BMI:  Body mass index is 24.63 kg/m.  Estimated Nutritional Needs:   Kcal:  1950-2150  Protein:  110-125 grams  Fluid:  > 1.9 L    Camden Knotek A. Jimmye Norman, RD, LDN, [place] Registered Dietitian II Certified Diabetes Care and Education Specialist Pager: [phone] After hours Pager: [phone]

## 2018-07-25 ENCOUNTER — Inpatient Hospital Stay (HOSPITAL_COMMUNITY): Payer: PPO | Admitting: Occupational Therapy

## 2018-07-25 ENCOUNTER — Inpatient Hospital Stay (HOSPITAL_COMMUNITY): Payer: PPO | Admitting: Speech Pathology

## 2018-07-25 ENCOUNTER — Inpatient Hospital Stay (HOSPITAL_COMMUNITY): Payer: PPO | Admitting: Physical Therapy

## 2018-07-25 DIAGNOSIS — S32402D Unspecified fracture of left acetabulum, subsequent encounter for fracture with routine healing: Secondary | ICD-10-CM

## 2018-07-25 LAB — GLUCOSE, CAPILLARY
Glucose-Capillary: 123 mg/dL — ABNORMAL HIGH (ref 70–99)
Glucose-Capillary: 191 mg/dL — ABNORMAL HIGH (ref 70–99)
Glucose-Capillary: 216 mg/dL — ABNORMAL HIGH (ref 70–99)
Glucose-Capillary: 172 mg/dL — ABNORMAL HIGH (ref 70–99)

## 2018-07-25 MED ORDER — BISACODYL 5 MG PO TBEC
15.0000 mg | DELAYED_RELEASE_TABLET | ORAL | Status: DC
  Administered 2018-07-25 – 2018-07-28 (×2): 15 mg via ORAL
  Filled 2018-07-25 (×2): qty 3

## 2018-07-25 MED ORDER — DOCUSATE SODIUM 100 MG PO CAPS
200.0000 mg | ORAL_CAPSULE | Freq: Every day | ORAL | Status: DC
  Administered 2018-07-25 – 2018-07-30 (×6): 200 mg via ORAL
  Filled 2018-07-25 (×7): qty 2

## 2018-07-25 MED ORDER — DOCUSATE SODIUM 100 MG PO CAPS
100.0000 mg | ORAL_CAPSULE | Freq: Once | ORAL | Status: AC
  Administered 2018-07-25: 22:00:00 100 mg via ORAL
  Filled 2018-07-25: qty 1

## 2018-07-25 NOTE — Progress Notes (Signed)
Occupational Therapy Session Note  Patient Details  Name: Ricardo Le MRN: [id number] Date of Birth: 1941/01/21  Today's Date: 07/25/2018 OT Individual Time: 2505-3976 OT Individual Time Calculation (min): 29 min  and Today's Date: 07/25/2018 OT Missed Time: 47 Minutes Missed Time Reason: Patient ill (comment);Pain   Short Term Goals: Week 1:  OT Short Term Goal 1 (Week 1): Pt will maintain min A standing balance during LB clothing management.  OT Short Term Goal 2 (Week 1): Pt will perform LB self care with mod A overall. OT Short Term Goal 3 (Week 1): Pt will perform UB self care with min A overall.  OT Short Term Goal 4 (Week 1): Pt will perform toilet transfer with min A overall.  Skilled Therapeutic Interventions/Progress Updates:    Upon entering the room, pt in bed and attempting to eat breakfast with encouragement. Pt needing set up A to open containers and cut food. RN present and providing medications as well. Pt with 8/10 R hip and stomach pain. Pt requesting emesis bag secondary to nausea. Pt declined further OT intervention this session and OT repositioned pt on L side with pt reporting , " that feels some better." Pt remaining in bed with bed rails up and bed alarm set. Call bell within reach.   Therapy Documentation Precautions:  Precautions Precautions: Fall Restrictions Weight Bearing Restrictions: Yes LLE Weight Bearing: Touchdown weight bearing LLE Partial Weight Bearing Percentage or Pounds: until  6/24 and then WBAT General: General OT Amount of Missed Time: 45 Minutes Vital Signs:  Pain: Pain Assessment Pain Scale: 0-10 Pain Score: Asleep Pain Type: Chronic pain Pain Location: Hip Pain Orientation: Right Pain Descriptors / Indicators: Aching;Discomfort Pain Frequency: Constant Pain Onset: On-going Patients Stated Pain Goal: 3 Pain Intervention(s): Medication (See eMAR)   Therapy/Group: Individual Therapy  Gypsy Decant 07/25/2018, 11:54  AM

## 2018-07-25 NOTE — Progress Notes (Signed)
Speech Language Pathology Daily Session Note  Patient Details  Name: Ricardo Le MRN: [id number] Date of Birth: 02-24-1940  Today's Date: 07/25/2018 SLP Individual Time: 1400-1500 SLP Individual Time Calculation (min): 60 min  Short Term Goals: Week 1: SLP Short Term Goal 1 (Week 1): Pt will utilize external memory aids to recall orientation information with Min A cues.  SLP Short Term Goal 2 (Week 1): Pt will complete basic familiar problem solving tasks with Min A cues.  SLP Short Term Goal 3 (Week 1): Pt will demonstrate selective atteniton to task for ! 5 minutes with Min A cues.  SLP Short Term Goal 4 (Week 1): Pt will demonstrate intellectual awareness by identifyin 2 physical and 2 cognitive deficits related to acute hospitalization with Mod A cues.   Skilled Therapeutic Interventions:    Skilled treatment session focused on cognition goals. SLP facilitated session by providing personal amplifier to aid with [facility]. Pt with increased ability to understand verbal communication. SLP further administered the [facility] version 7.1. Pt obtained score of 16 out of 30 (n=> 16) further confirming moderate cognitive impairment. Pt was oriented and did present with as much confusion or malaise as previous session. Specifically, pt demonstrated deficits in working memory, mental math, problem solving with good awareness of deficits. Amplifier left with pt and he was able to demonstrate how to use appropriately. Pt left upright in bed, bed alarm on and all needs within reach. Continue per current plan of care.   Pain    Therapy/Group: Individual Therapy  Aanyah Loa 07/25/2018, 3:15 PM

## 2018-07-25 NOTE — Patient Care Conference (Signed)
Inpatient RehabilitationTeam Conference and Plan of Care Update Date: 07/24/2018   Time: 10:55 AM    Patient Name: Ricardo Le      Medical Record Number: [id number]  Date of Birth: 11-16-1940 Sex: Male         Room/Bed: 5H84O/9G29B-28 Payor Info: Payor: [employer] / Plan: [employer] PPO / Product Type: *No Product type* /    Admitting Diagnosis: CVA 1 Team  Closed TBI  Admit Date/Time:  07/23/2018  6:44 PM Admission Comments: No comment available   Primary Diagnosis:  <principal problem not specified> Principal Problem: <principal problem not specified>  Patient Active Problem List   Diagnosis Date Noted  . Traumatic intraventricular hemorrhage ([place]) 07/23/2018  . Intraventricular hemorrhage ([place]) 07/20/2018  . Fracture of left acetabulum ([place]) 07/09/2018  . Elevated troponin   . Coronary artery disease involving native coronary artery of native heart with unstable angina pectoris ([place])   . Chest pain 02/20/2017  . Precordial chest pain   . Angina pectoris ([place]) 02/19/2017  . Atherosclerotic heart disease of native coronary artery with unstable angina pectoris ([place]) 03/08/2015  . Hyperlipidemia 01/26/2015  . Cervical spondylosis without myelopathy 07/28/2014  . Cervical spondylosis 07/28/2014  . Constipation 05/19/2014  . Pain 05/18/2014  . PAD (peripheral artery disease) ([place]) 03/20/2012  . Unstable angina ([place]) 05/05/2011  . DDD (degenerative disc disease)   . HNP (herniated nucleus pulposus), lumbar   . Pancreatic mass   . Nephrolithiasis   . Carotid bruit   . S/P CABG x 4 03/08/2011  . Bruit 07/06/2010  . NONSPECIFIC ABN FINDING RAD & OTH EXAM GI TRACT 06/09/2009  . NAUSEA ALONE 05/28/2009  . CHANGE IN BOWELS 05/28/2009  . Elevated lipids 05/27/2009  . Abdominal pain, generalized 05/27/2009  . Type 2 diabetes mellitus with vascular disease ([place]) 12/16/2008  . Essential hypertension 12/16/2008  . BACK PAIN 12/16/2008  . Palpitations  12/16/2008    Expected Discharge Date: Expected Discharge Date: (2 weeks)  Team Members Present: Physician leading conference: Dr. Alysia Penna Social Worker Present: Alfonse Alpers, LCSW Nurse Present: Frances Maywood, RN PT Present: Barrie Folk, PT;Rosita Dechalus, PTA OT Present: Darleen Crocker, OT SLP Present: Stormy Fabian, SLP PPS Coordinator present : Ileana Ladd, PT     Current Status/Progress Goal Weekly Team Focus  Medical   78 year old male with history of fall resulting in left acetabular fracture as well as IVH  Avoid further falls  Blood pressure management, initiating therapy and maintaining weightbearing status   Bowel/Bladder   Continent x2 LBM 6/6 sorbitol and suppository given HS  Continue to be continent, bowel movement within the next 24 hours.   assess bowel and blaader needs qshit and prn   Swallow/Nutrition/ Hydration             ADL's   mod - max A with self care and transfers  supervision overall  functional mobility/transfer, endurance, cognition,strengthening, self care retraining   Mobility   PT Eval Pending.          Communication             Safety/Cognition/ Behavioral Observations  eval pending  eval pending  completion of evaluation   Pain   No complaints of pain upon admission. per chart c/o leg and hip pain relieved with PRN tylenol  pain less than 2  assess apin qshift and PRN   Skin   stage 2 to sacrum foam dressing in place  promote proper wound healing. no further skin  breakdown  assess skin qshift and PRN    Rehab Goals Patient on target to meet rehab goals: Yes Rehab Goals Revised: none *See Care Plan and progress notes for long and short-term goals.     Barriers to Discharge  Current Status/Progress Possible Resolutions Date Resolved   Physician    Medical stability     Initiating therapy program  See above, will need tele-sitter once family is no longer able to stay      Nursing  Medical stability                PT  Decreased caregiver support;Home environment access/layout;Weight bearing restrictions;Medication compliance;Behavior                 OT Behavior  cognition             SLP                SW                Discharge Planning/Teaching Needs:  Pt to d/c home with family to provide necessary care.  Pt can come in for family education closer to d/c.   Team Discussion:  Pt with left acetabular fx which is healing non operatively.  Pt fell and then had ventricular hemorrhage/TBI.  Pt's dtr was staying with him, but has gone home and needs a telesitter and to be closer to the nurses' station.  Pt is mod A out of chair to the toilet.  Pt c/o nausea while working with OT.  Pt was able to dress on 3-in-1.  He is partial WB at 50% which will be hard to determine.  Revisions to Treatment Plan:  none    Continued Need for Acute Rehabilitation Level of Care: The patient requires daily medical management by a physician with specialized training in physical medicine and rehabilitation for the following conditions: Daily direction of a multidisciplinary physical rehabilitation program to ensure safe treatment while eliciting the highest outcome that is of practical value to the patient.: Yes Daily medical management of patient stability for increased activity during participation in an intensive rehabilitation regime.: Yes Daily analysis of laboratory values and/or radiology reports with any subsequent need for medication adjustment of medical intervention for : Neurological problems;Other   I attest that I was present, lead the team conference, and concur with the assessment and plan of the team.Team conference was held via web/ teleconference due to [place] - 19.   Reggie Bise, [name] Mesi 07/25/2018, 3:16 PM

## 2018-07-25 NOTE — Progress Notes (Signed)
[facility] PHYSICAL MEDICINE & REHABILITATION PROGRESS NOTE   Subjective/Complaints: Remains intermittently confused, "how was your event?"  ROS- limited by mental status   Objective:   Ct Head Wo Contrast  Result Date: 07/23/2018 CLINICAL DATA:  Head trauma, headache EXAM: CT HEAD WITHOUT CONTRAST TECHNIQUE: Contiguous axial images were obtained from the base of the skull through the vertex without intravenous contrast. COMPARISON:  07/21/2018, 07/20/2018 FINDINGS: Brain: No significant interval change in layering blood product within the lateral ventricles. Mild prominence of the lateral ventricles and periventricular white matter hypodensity is unchanged from prior. No evidence of overt hydrocephalus. No evidence of new hemorrhage. Vascular: No hyperdense vessel or unexpected calcification. Skull: Normal. Negative for fracture or focal lesion. Sinuses/Orbits: No acute finding. Other: None. IMPRESSION: No significant interval change in layering blood product within the lateral ventricles. Mild prominence of the lateral ventricles and periventricular white matter hypodensity is unchanged from prior. No evidence of overt hydrocephalus. No evidence of new hemorrhage. Electronically Signed   By: Eddie Candle M.D.   On: 07/23/2018 13:10   Recent Labs    07/23/18 0333 07/24/18 0613  WBC 11.5* 11.9*  HGB 14.4 14.3  HCT 40.8 40.4  PLT 342 351   Recent Labs    07/23/18 0333 07/24/18 0613  NA 131* 129*  K 3.7 3.8  CL 95* 92*  CO2 24 25  GLUCOSE 133* 134*  BUN 14 15  CREATININE 0.91 0.91  CALCIUM 9.1 8.9    Intake/Output Summary (Last 24 hours) at 07/25/2018 0809 Last data filed at 07/25/2018 0548 Gross per 24 hour  Intake 354 ml  Output 1100 ml  Net -746 ml     Physical Exam: Vital Signs Blood pressure (!) 134/59, pulse 72, temperature 97.9 F (36.6 C), temperature source Oral, resp. rate 18, height 5\' 5"  (1.651 m), weight 67.1 kg, SpO2 95 %.   General: No acute  distress Mood and affect are appropriate Heart: Regular rate and rhythm no rubs murmurs or extra sounds Lungs: Clear to auscultation, breathing unlabored, no rales or wheezes Abdomen: Positive bowel sounds, soft nontender to palpation, nondistended Extremities: No clubbing, cyanosis, or edema Skin: No evidence of breakdown, no evidence of rash Neurologic: Cranial nerves II through XII intact, motor strength is 4/5 in bilateral deltoid, bicep, tricep, grip, 4/5 RIght and 3- Left hip flexor, knee extensors, ankle dorsiflexor and plantar flexor Sensory exam normal sensation to light touch and proprioception in bilateral upper and lower extremities Cerebellar exam normal finger to nose to finger as well as heel to shin in bilateral upper and lower extremities Musculoskeletal: Full range of motion in all 4 extremities. No joint swelling   Assessment/Plan: 1. Functional deficits secondary to Traumatic IVH and Left acetabular fracture which require 3+ hours per day of interdisciplinary therapy in a comprehensive inpatient rehab setting.  Physiatrist is providing close team supervision and 24 hour management of active medical problems listed below.  Physiatrist and rehab team continue to assess barriers to discharge/monitor patient progress toward functional and medical goals  Care Tool:  Bathing    Body parts bathed by patient: Right arm, Left arm, Chest, Abdomen, Front perineal area, Right upper leg, Left upper leg, Face   Body parts bathed by helper: Buttocks, Right lower leg, Left lower leg     Bathing assist Assist Level: Moderate Assistance - Patient 50 - 74%     Upper Body Dressing/Undressing Upper body dressing   What is the patient wearing?: Pull over shirt  Upper body assist Assist Level: Moderate Assistance - Patient 50 - 74%    Lower Body Dressing/Undressing Lower body dressing      What is the patient wearing?: Underwear/pull up, Pants     Lower body assist Assist  for lower body dressing: Moderate Assistance - Patient 50 - 74%     Toileting Toileting    Toileting assist Assist for toileting: Minimal Assistance - Patient > 75%     Transfers Chair/bed transfer  Transfers assist     Chair/bed transfer assist level: Moderate Assistance - Patient 50 - 74%     Locomotion Ambulation   Ambulation assist      Assist level: Moderate Assistance - Patient 50 - 74% Assistive device: Walker-rolling Max distance: 15   Walk 10 feet activity   Assist     Assist level: Moderate Assistance - Patient - 50 - 74% Assistive device: Walker-rolling   Walk 50 feet activity   Assist Walk 50 feet with 2 turns activity did not occur: Safety/medical concerns         Walk 150 feet activity   Assist Walk 150 feet activity did not occur: Safety/medical concerns         Walk 10 feet on uneven surface  activity   Assist Walk 10 feet on uneven surfaces activity did not occur: Safety/medical concerns         Wheelchair     Assist   Type of Wheelchair: Manual    Wheelchair assist level: Minimal Assistance - Patient > 75% Max wheelchair distance: 165ft    Wheelchair 50 feet with 2 turns activity    Assist        Assist Level: Minimal Assistance - Patient > 75%   Wheelchair 150 feet activity     Assist     Assist Level: Minimal Assistance - Patient > 75%    Medical Problem List and Plan: 1.Decreased functional mobility with cognitive deficitssecondary to traumatic intraventricular hemorrhage with conservative care as well as left acetabular fracture.Weightbearing advanced to 50% partial weightbearing left lower extremity x2 weeks then progress to full weightbearing .CIR PT, OT, SLP 2. Antithrombotics: -DVT/anticoagulation:SCDs -antiplatelet therapy: N/A 3. Pain Management:Neurontin 100 mg twice daily as needed 4. Mood:Melatonin 3 mg nightly -antipsychotic agents: N/A 5.  Neuropsych: This patientis notcapable of making decisions on hisown behalf. 6. Skin/Wound Care:Stage II pressure injury to coccyx. Bilateral stage I pressure injury to heels. Routine skin checks 7. Fluids/Electrolytes/Nutrition:Routine in and outs with follow-up chemistries 8.Hypertension. Ranexa 500 mg twice daily, lisinopril 20 mg daily, Norvasc 10 mg daily 9. History of diabetes mellitus. Hemoglobin A1c 7.9. Lantus insulin 18 units nightly. Check blood sugars before meals and at bedtime CBG (last 3)  Recent Labs    07/24/18 1638 07/24/18 2106 07/25/18 0619  GLUCAP 126* 236* 123*  controlled 6/11 10. CAD with history of CABG. Aspirin on hold due to intraventricular hemorrhage. Follow-up cardiology services as needed 11. Hyperlipidemia. Lipitor 12. BPH. Flomax 0.4 mg daily. 13. GERD. Protonix, added carafate on 6/10    LOS: 2 days A FACE TO FACE EVALUATION WAS PERFORMED  Charlett Blake 07/25/2018, 8:09 AM

## 2018-07-25 NOTE — Progress Notes (Signed)
Physical Therapy Session Note  Patient Details  Name: Ricardo Le MRN: [id number] Date of Birth: Jun 06, 1940  Today's Date: 07/25/2018 PT Individual Time: 1100-1155 PT Individual Time Calculation (min): 55 min   Short Term Goals: Week 1:  PT Short Term Goal 1 (Week 1): Pt will consistently perform bed<>WC transfers with min assist  PT Short Term Goal 2 (Week 1): Pt will ambulate >48f with min assist and LRAD  PT Short Term Goal 3 (Week 1): Pt will perform all bed mobility with supervision assist.   Skilled Therapeutic Interventions/Progress Updates:      Pt received supine in bed and agreeable to PT. Supine>sit transfer with supervision assist, heavy use of bed rails and min cues for safety. Stand pivot transfer with min assist from PT. Pt reports significant pain in R SI joint with all movement but agreeable to PT  WC mobility x 1015fwith supervision assist and min cues for obstacle awareness and safety in turns. Continued pain in R side/SI region.   Gait training performed in rehab gym x 1033fwith RW, until pt unable to continue due to R SI pain. PT able to palpate painful region with no notable abnormality noted on R vs L. Following gait pt reports need for urination and attempts in urinal , but unable to void.   Transfers completed with min assist throughout treatment for sit<>stand and stand pivot to and from WC,[facility] pt reports increased pain with transitions.  Pt returned to room and performed  transfer to bed with RW, min assist by stand pivot trasnfer. Sit>supine completed with supervision assist. Pt again attempted to use urinal for bladder management , but unable to void. Pt left supine in bed with call bell in reach and all needs met.        Therapy Documentation Precautions:  Precautions Precautions: Fall Restrictions Weight Bearing Restrictions: Yes LLE Weight Bearing: Touchdown weight bearing LLE Partial Weight Bearing Percentage or Pounds: until  6/24 and then  WBAT Pain:   9/10 R side/SI. Sharp. RN aware.    Therapy/Group: Individual Therapy  AusLorie Phenix11/2020, 12:18 PM

## 2018-07-26 ENCOUNTER — Telehealth: Payer: Self-pay | Admitting: *Deleted

## 2018-07-26 ENCOUNTER — Inpatient Hospital Stay (HOSPITAL_COMMUNITY): Payer: PPO | Admitting: Physical Therapy

## 2018-07-26 ENCOUNTER — Inpatient Hospital Stay (HOSPITAL_COMMUNITY): Payer: PPO | Admitting: Speech Pathology

## 2018-07-26 ENCOUNTER — Inpatient Hospital Stay (HOSPITAL_COMMUNITY): Payer: PPO | Admitting: Occupational Therapy

## 2018-07-26 LAB — VITAMIN B12: Vitamin B-12: 999 pg/mL — ABNORMAL HIGH (ref 180–914)

## 2018-07-26 LAB — GLUCOSE, CAPILLARY
Glucose-Capillary: 156 mg/dL — ABNORMAL HIGH (ref 70–99)
Glucose-Capillary: 328 mg/dL — ABNORMAL HIGH (ref 70–99)
Glucose-Capillary: 128 mg/dL — ABNORMAL HIGH (ref 70–99)
Glucose-Capillary: 163 mg/dL — ABNORMAL HIGH (ref 70–99)

## 2018-07-26 MED ORDER — TROLAMINE SALICYLATE 10 % EX CREA
TOPICAL_CREAM | Freq: Two times a day (BID) | CUTANEOUS | Status: DC | PRN
  Filled 2018-07-26: qty 85

## 2018-07-26 MED ORDER — MEGESTROL ACETATE 400 MG/10ML PO SUSP
200.0000 mg | Freq: Two times a day (BID) | ORAL | Status: DC
  Administered 2018-07-26 – 2018-07-30 (×8): 200 mg via ORAL
  Filled 2018-07-26 (×8): qty 10

## 2018-07-26 MED ORDER — MUSCLE RUB 10-15 % EX CREA
TOPICAL_CREAM | Freq: Two times a day (BID) | CUTANEOUS | Status: DC | PRN
  Administered 2018-07-26: 13:00:00 via TOPICAL
  Filled 2018-07-26: qty 85

## 2018-07-26 NOTE — Progress Notes (Signed)
Social Work Assessment and Plan  Patient Details  Name: Ricardo Le MRN: [id number] Date of Birth: 1940-10-23  Today's Date: 07/26/2018  Problem List:  Patient Active Problem List   Diagnosis Date Noted  . Traumatic intraventricular hemorrhage ([place]) 07/23/2018  . Intraventricular hemorrhage ([place]) 07/20/2018  . Fracture of left acetabulum ([place]) 07/09/2018  . Elevated troponin   . Coronary artery disease involving native coronary artery of native heart with unstable angina pectoris ([place])   . Chest pain 02/20/2017  . Precordial chest pain   . Angina pectoris ([place]) 02/19/2017  . Atherosclerotic heart disease of native coronary artery with unstable angina pectoris ([place]) 03/08/2015  . Hyperlipidemia 01/26/2015  . Cervical spondylosis without myelopathy 07/28/2014  . Cervical spondylosis 07/28/2014  . Constipation 05/19/2014  . Pain 05/18/2014  . PAD (peripheral artery disease) ([place]) 03/20/2012  . Unstable angina ([place]) 05/05/2011  . DDD (degenerative disc disease)   . HNP (herniated nucleus pulposus), lumbar   . Pancreatic mass   . Nephrolithiasis   . Carotid bruit   . S/P CABG x 4 03/08/2011  . Bruit 07/06/2010  . NONSPECIFIC ABN FINDING RAD & OTH EXAM GI TRACT 06/09/2009  . NAUSEA ALONE 05/28/2009  . CHANGE IN BOWELS 05/28/2009  . Elevated lipids 05/27/2009  . Abdominal pain, generalized 05/27/2009  . Type 2 diabetes mellitus with vascular disease ([place]) 12/16/2008  . Essential hypertension 12/16/2008  . BACK PAIN 12/16/2008  . Palpitations 12/16/2008   Past Medical History:  Past Medical History:  Diagnosis Date  . CAD (coronary artery disease)    a. 04/2007 s/p CABG x 4  - LIMA->LAD, VG->OM1->Om2, VG->PDA;  b. 12/2008 & 06/2010 Caths - Native multivessel dzs with 4/4 patent grafts. c. cath 03/09/2015 patent grafts, unchanged from prior cath.  . Cancer [facility])    pancreatic  March 2011  . Carotid bruit    a. 07/2010 U/S- 0-39% bilat ICA stenosis  . Chest pain     Noncardiac probably related to reflux  . DDD (degenerative disc disease)    several surgeries  . Fracture acetabulum-closed ([place]) 07/08/2018  . GERD (gastroesophageal reflux disease)   . HNP (herniated nucleus pulposus), lumbar    a. L2-3, s/p laminotomy, microdiskectomy 02/2009  . HOH (hard of hearing)    uses amplifiers   . Nephrolithiasis   . PAD (peripheral artery disease) ([place]) 04/2014   s/p L Common Iliac Stent  . Palpitations   . 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 Pneumonia    hx of  pna  . Pure hypercholesterolemia    statin intolerance  . Type II or unspecified type diabetes mellitus without mention of complication, not stated as uncontrolled    insulin dependent  . Unspecified essential hypertension    Past Surgical History:  Past Surgical History:  Procedure Laterality Date  . ABDOMINAL AORTAGRAM N/A 05/13/2014   Procedure: ABDOMINAL [name];  Surgeon: Wellington Hampshire, MD;  Location: [facility] CATH LAB;  Service: Cardiovascular;  Laterality: N/A;  . ANTERIOR CERVICAL DECOMP/DISCECTOMY FUSION N/A 07/28/2014   Procedure: ANTERIOR CERVICAL DECOMPRESSION/DISCECTOMY FUSION CERVICAL FOUR-FIVE CERVICAL FIVE-SIX ;  Surgeon: Earnie Larsson, MD;  Location: [facility] NEURO ORS;  Service: Neurosurgery;  Laterality: N/A;  . APPENDECTOMY    . arthroscopy right knee    . BACK SURGERY    . CARDIAC CATHETERIZATION N/A 03/09/2015   Procedure: Left Heart Cath and Cors/Grafts Angiography;  Surgeon: [name] Sine, MD;  [facility]  INVASIVE CV LAB;  Service: Cardiovascular;  Laterality: N/A;  . CHOLECYSTECTOMY    . CORONARY ARTERY BYPASS GRAFT     X 4 2009 (LIMA to LAD, SVG to first cicumflex marginal branch with sequential SVG to second cicumflex marginal branch  and saphenous vein to posterior descending coronary artery with endoscopic,vein harvest rt. lower exttremity by Dr.Owen on March 31,2009  . CYST REMOVED     FROM SPINE  . CYSTOURETHROSCOPY      right retrograde pyelogram,manipulate stone in the renal pelvis, rt. double-j catheter.  [name] Kitchen EYE SURGERY    . KNEE SURGERY     rt knee  . LEFT HEART CATH AND CORS/GRAFTS ANGIOGRAPHY N/A 02/20/2017   Procedure: LEFT HEART CATH AND CORS/GRAFTS ANGIOGRAPHY;  Surgeon: Leonie Man, MD;  Location: [facility] CV LAB;  Service: Cardiovascular;  Laterality: N/A;  . LEFT HEART CATH AND CORS/GRAFTS ANGIOGRAPHY N/A 05/28/2018   Procedure: LEFT HEART CATH AND CORS/GRAFTS ANGIOGRAPHY;  Surgeon: Burnell Blanks, MD;  Location: [facility] CV LAB;  Service: Cardiovascular;  Laterality: N/A;  . NEPHROLITHOTOMY     PERCUTANEOUS  . PARS PLANA VITRECTOMY     lt. eye,retinal photocoagulation lt. eye, membrane peel lt. eye.  [name] Kitchen PERCUTANEOUS STENT INTERVENTION Left 05/13/2014   Procedure: PERCUTANEOUS STENT INTERVENTION;  Surgeon: Wellington Hampshire, MD;  Location: [facility];  Service: Cardiovascular;  Laterality: Left;  COMMON ILLIAC  . re-exploratin of laminectomy  05/2008   (RT L2-3) WITH REDO MICRODISKECTOMY   Social History:  reports that he has never smoked. He has never used smokeless tobacco. He reports that he does not drink alcohol or use drugs.  Family / Support Systems Marital Status: Married How Long?: 80 years in July Patient Roles: Spouse, Parent, Other (Comment)(grandfather; brother; brother-in-law) Spouse/Significant Other: Ricardo Le - wife - [phone] (h)/([phone] (m) Children: Ricardo Le - son - [phone] Other Supports: 3 adult dtrs and extended family Anticipated Caregiver: wife, daughter and son Ability/Limitations of Caregiver: none Caregiver Availability: 24/7 Family Dynamics: close, supportive family and community  Social History Preferred language: English Religion: Holiness/Pentecostal Read: Yes Write: Yes Employment Status: Retired Public relations account executive Issues: none reported Guardian/Conservator: MD has determined that pt is not capable  of making his own decisions.  Wife would be next of kin for decision making.   Abuse/Neglect Abuse/Neglect Assessment Can Be Completed: Yes Physical Abuse: Denies Verbal Abuse: Denies Sexual Abuse: Denies Exploitation of patient/patient's resources: Denies Self-Neglect: Denies  Emotional Status Pt's affect, behavior and adjustment status: Pt is ready to work with therapists and reports feeling okay emotionally. Recent Psychosocial Issues: Pt's wife reports that his hearing is worse than it usually is. Psychiatric History: none reported Substance Abuse History: none reported  Patient / Family Perceptions, Expectations & Goals Pt/Family understanding of illness & functional limitations: Pt/wife have a cgood understanding of pt's condition/limitations. Premorbid pt/family roles/activities: Pt was not able to do much PTA as he was still recovering from the fall.  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 Pt/family expectations/goals: Pt's family wants pt to get to where he can get up and down by himself and walk about and then they will be able to manage him at home.  Community [employer] Agencies: None Premorbid Home Care/DME Agencies: Other (Comment)(Pt had [facility] prior to coming into hospital.  He has a walker, walk in shower with built in  seat, grab bars, hand held shower head, and w/c.)  Discharge Planning Living Arrangements: Spouse/significant other, Children Support Systems: Spouse/significant other, Children, Other relatives, Friends/neighbors, Immunologist, Home care staff Type of Residence: Private residence Insurance Resources: Multimedia programmer (specify)([employer]) Financial Resources: Social Security Financial Screen Referred: No Money Management: Spouse Does the patient have any problems obtaining your medications?:  No Home Management: Pt's wife was doing inside chores.  Grandsons are now helping with outside chores. Patient/Family Preliminary Plans: Pt plans to return to his home where his wife and dtr will be with him 24/7 for supervision. Social Work Anticipated Follow Up Needs: HH/OP Expected length of stay: 2 weeks  Clinical Impression CSW met with pt and later talked with pt's wife via telephone to introduce self and role of CSW, as well as to complete assessment.  Pt is doing well overall emotionally and although he wants to be home, he is committed to rehab and wants to get stronger.  Pt's wife is prepared for pt to come home once he reaches a supervision level.  She asked about pt having a support person to stay with him due to his lack of hearing and CSW has taken that request to the nursing director.  She also asked if a medication could be started for pt's appetite.  CSW explained she would need to talk with the doctor about this and sent the doctor a message and wife left one at the office.  No other concerns/questions/needs.  CSW will continue to follow and assist as needed.  Ricardo Le, [name] Mesi 07/26/2018, 4:37 PM

## 2018-07-26 NOTE — Progress Notes (Signed)
[facility] Individual Statement of Services  Patient Name:  Ricardo Le  Date:  07/26/2018  Welcome to the [facility].  Our goal is to provide you with an individualized program based on your diagnosis and situation, designed to meet your specific needs.  With this comprehensive rehabilitation program, you will be expected to participate in at least 3 hours of rehabilitation therapies Monday-Friday, with modified therapy programming on the weekends.  Your rehabilitation program will include the following services:  Physical Therapy (PT), Occupational Therapy (OT), Speech Therapy (ST), 24 hour per day rehabilitation nursing, Case Management (Social Worker), Rehabilitation Medicine, Nutrition Services and Pharmacy Services  Weekly team conferences will be held on Wednesdays to discuss your progress.  Your Social Worker will talk with you frequently to get your input and to update you on team discussions.  Team conferences with you and your family in attendance may also be held.  Expected length of stay:  2 weeks  Overall anticipated outcome:  Supervision with contact guard assistance for stairs  Depending on your progress and recovery, your program may change. Your Social Worker will coordinate services and will keep you informed of any changes. Your Social Worker's name and contact numbers are listed  below.  The following services may also be recommended but are not provided by the [facility] will be made to provide these services after discharge if needed.  Arrangements include referral to agencies that provide these services.  Your insurance has been verified to be:  [employer]  Your primary doctor is:  Dr. Lovette Cliche  Pertinent information will be shared with your doctor and your insurance  company.  Social Worker:  Alfonse Alpers, LCSW  [phone] or [phone]  Information discussed with and copy given to patient by: Trey Sailors, 07/26/2018, 2:40 PM

## 2018-07-26 NOTE — Progress Notes (Addendum)
Speech Language Pathology Daily Session Note  Patient Details  Name: Ricardo Le MRN: [id number] Date of Birth: [birth year]  Today's Date: 07/26/2018 SLP Individual Time: 1345-1430 SLP Individual Time Calculation (min): 45 min  Short Term Goals: Week 1: SLP Short Term Goal 1 (Week 1): Pt will utilize external memory aids to recall orientation information with Min A cues.  SLP Short Term Goal 2 (Week 1): Pt will complete basic familiar problem solving tasks with Min A cues.  SLP Short Term Goal 3 (Week 1): Pt will demonstrate selective atteniton to task for ! 5 minutes with Min A cues.  SLP Short Term Goal 4 (Week 1): Pt will demonstrate intellectual awareness by identifyin 2 physical and 2 cognitive deficits related to acute hospitalization with Mod A cues.   Skilled Therapeutic Interventions:  Skilled treatment session focused on cognition goals. SLP received pt in bed with blanket over his head. He stated that he had a "terrible" headache. Nursing aware and checking eMAR. Environmental accommodations were made but pt continued to stated that he just couldn't concentrate. Pt left in bed, bed alarm on and all needs within reach. Nursing aware.      Pain Pain Assessment Pain Scale: Faces Pain Score: 2  Faces Pain Scale: Hurts even more Pain Type: Acute pain Pain Location: Back Pain Orientation: Right Pain Descriptors / Indicators: Aching;Discomfort Pain Frequency: Constant Pain Onset: On-going Pain Intervention(s): Medication (See eMAR)(tylenol given)  Therapy/Group: Individual Therapy  Pau Banh 07/26/2018, 5:50 PM

## 2018-07-26 NOTE — Telephone Encounter (Signed)
This about an inpatient patient. Inez Catalina Mr Berres's wife has called and would like to speak with DR Kirsteins. She has not seen him and wants an update on her husband's condition and to discuss some medications.  Her # [phone] (M).

## 2018-07-26 NOTE — Progress Notes (Signed)
Spoke with patient's wife today regarding concerns about medications.  She asked about ranolazine causing headaches.  This was already addressed by PA earlier this morning.  Also asked about his bowel medications this was already addressed by PA yesterday.  Patient's wife would like daughter to be able to sit with patient at night.  We discussed that this would be beneficial and reduce fall risk.  Also discussed that recovery from this traumatic brain injury would take months. Also discussed that estimated length of stay is approximately 2 weeks

## 2018-07-26 NOTE — Progress Notes (Addendum)
[facility] PHYSICAL MEDICINE & REHABILITATION PROGRESS NOTE   Subjective/Complaints:  Pt feels like he needs to have BM , no abd pain  Right side low back pain , no trauma  ROS- denies CP, SOB,  N/V/D   Objective:   No results found. Recent Labs    07/24/18 0613  WBC 11.9*  HGB 14.3  HCT 40.4  PLT 351   Recent Labs    07/24/18 0613  NA 129*  K 3.8  CL 92*  CO2 25  GLUCOSE 134*  BUN 15  CREATININE 0.91  CALCIUM 8.9    Intake/Output Summary (Last 24 hours) at 07/26/2018 0743 Last data filed at 07/26/2018 0344 Gross per 24 hour  Intake 240 ml  Output 1150 ml  Net -910 ml     Physical Exam: Vital Signs Blood pressure 122/68, pulse 82, temperature 97.8 F (36.6 C), temperature source Oral, resp. rate 18, height 5\' 5"  (1.651 m), weight 67.1 kg, SpO2 97 %.   General: No acute distress Mood and affect are appropriate Heart: Regular rate and rhythm no rubs murmurs or extra sounds Lungs: Clear to auscultation, breathing unlabored, no rales or wheezes Abdomen: Positive bowel sounds, soft nontender to palpation, nondistended- unchanged  Extremities: No clubbing, cyanosis, or edema Skin: No evidence of breakdown, no evidence of rash Neurologic: Cranial nerves II through XII intact, motor strength is 4/5 in bilateral deltoid, bicep, tricep, grip, 4/5 RIght and 3+ Left hip flexor, knee extensors, ankle dorsiflexor and plantar flexor Sensory exam normal sensation to light touch and proprioception in bilateral upper and lower extremities Cerebellar exam normal finger to nose to finger as well as heel to shin in bilateral upper and lower extremities Musculoskeletal: RIght PSIS tendernss   Assessment/Plan: 1. Functional deficits secondary to Traumatic IVH and Left acetabular fracture which require 3+ hours per day of interdisciplinary therapy in a comprehensive inpatient rehab setting.  Physiatrist is providing close team supervision and 24 hour management of active  medical problems listed below.  Physiatrist and rehab team continue to assess barriers to discharge/monitor patient progress toward functional and medical goals  Care Tool:  Bathing    Body parts bathed by patient: Right arm, Left arm, Chest, Abdomen, Front perineal area, Right upper leg, Left upper leg, Face   Body parts bathed by helper: Buttocks, Right lower leg, Left lower leg     Bathing assist Assist Level: Moderate Assistance - Patient 50 - 74%     Upper Body Dressing/Undressing Upper body dressing   What is the patient wearing?: Pull over shirt    Upper body assist Assist Level: Moderate Assistance - Patient 50 - 74%    Lower Body Dressing/Undressing Lower body dressing      What is the patient wearing?: Underwear/pull up, Pants     Lower body assist Assist for lower body dressing: Moderate Assistance - Patient 50 - 74%     Toileting Toileting    Toileting assist Assist for toileting: Moderate Assistance - Patient 50 - 74% Assistive Device Comment: urinal   Transfers Chair/bed transfer  Transfers assist     Chair/bed transfer assist level: Moderate Assistance - Patient 50 - 74%     Locomotion Ambulation   Ambulation assist      Assist level: Moderate Assistance - Patient 50 - 74% Assistive device: Walker-rolling Max distance: 15   Walk 10 feet activity   Assist     Assist level: Moderate Assistance - Patient - 50 - 74% Assistive device: Walker-rolling  Walk 50 feet activity   Assist Walk 50 feet with 2 turns activity did not occur: Safety/medical concerns         Walk 150 feet activity   Assist Walk 150 feet activity did not occur: Safety/medical concerns         Walk 10 feet on uneven surface  activity   Assist Walk 10 feet on uneven surfaces activity did not occur: Safety/medical concerns         Wheelchair     Assist   Type of Wheelchair: Manual    Wheelchair assist level: Minimal Assistance - Patient  > 75% Max wheelchair distance: 143ft    Wheelchair 50 feet with 2 turns activity    Assist        Assist Level: Minimal Assistance - Patient > 75%   Wheelchair 150 feet activity     Assist     Assist Level: Minimal Assistance - Patient > 75%    Medical Problem List and Plan: 1.Decreased functional mobility with cognitive deficitssecondary to traumatic intraventricular hemorrhage with conservative care as well as left acetabular fracture.Weightbearing advanced to 50% partial weightbearing left lower extremity x2 weeks then progress to full weightbearing  Would benefit from family staying with him at night  .CIR PT, OT, SLP 2. Antithrombotics: -DVT/anticoagulation:SCDs -antiplatelet therapy: N/A 3. Pain Management:Neurontin 100 mg twice daily as needed RIght low back pain likely immobility related will order trolamine cream an dKpad 4. Mood:Melatonin 3 mg nightly -antipsychotic agents: N/A 5. Neuropsych: This patientis notcapable of making decisions on hisown behalf. 6. Skin/Wound Care:Stage II pressure injury to coccyx. Bilateral stage I pressure injury to heels. Routine skin checks 7. Fluids/Electrolytes/Nutrition:Routine in and outs with follow-up chemistries 8.Hypertension. Ranexa 500 mg twice daily, lisinopril 20 mg daily, Norvasc 10 mg daily Vitals:   07/25/18 1931 07/26/18 0351  BP: 118/63 122/68  Pulse: 69 82  Resp: 19 18  Temp: 98.1 F (36.7 C) 97.8 F (36.6 C)  SpO2: 97% 97%   9. History of diabetes mellitus. Hemoglobin A1c 7.9. Lantus insulin 18 units nightly. Check blood sugars before meals and at bedtime CBG (last 3)  Recent Labs    07/25/18 1757 07/25/18 2133 07/26/18 0615  GLUCAP 216* 172* 156*  controlled 6/12 10. CAD with history of CABG. Aspirin on hold due to intraventricular hemorrhage. Follow-up cardiology services as needed 11. Hyperlipidemia. Lipitor 12. BPH. Flomax 0.4 mg  daily. 13. GERD. Protonix, added carafate on 6/10  14.  Constipation no BM recorded since rehab admit, on dulcolax 15mg  Q72h, colace 200mg  daily Also has prn senna, sorbitol and bisacodyl LOS: 3 days A FACE TO FACE EVALUATION WAS PERFORMED  Charlett Blake 07/26/2018, 7:43 AM

## 2018-07-26 NOTE — Progress Notes (Signed)
Spoke with cardiology services in regards to Ranexa and bouts of dizziness it was recommended this be stopped for now and follow-up outpatient

## 2018-07-26 NOTE — Progress Notes (Signed)
Social Work Patient ID: Ricardo Le, male   DOB: [birth year], 78 y.o.   MRN: [id number]   CSW met with pt and spoke with pt's wife via telephone to update her on team conference discussion and expected LOS of 2 weeks.  He will need supervision and she will provide that. CSW will continue to follow and assist as needed.

## 2018-07-26 NOTE — Progress Notes (Signed)
Occupational Therapy Session Note  Patient Details  Name: Ricardo Le MRN: [id number] Date of Birth: Dec 25, 1940  Today's Date: 07/26/2018 OT Individual Time: 9150-5697 OT Individual Time Calculation (min): 72 min   Short Term Goals: Week 1:  OT Short Term Goal 1 (Week 1): Pt will maintain min A standing balance during LB clothing management.  OT Short Term Goal 2 (Week 1): Pt will perform LB self care with mod A overall. OT Short Term Goal 3 (Week 1): Pt will perform UB self care with min A overall.  OT Short Term Goal 4 (Week 1): Pt will perform toilet transfer with min A overall.  Skilled Therapeutic Interventions/Progress Updates:    Pt greeted in bed, finishing up breakfast. No c/o pain at rest, but frustrated that he is unable to have a BM. He was agreeable to transfer to [facility] during session. Steady assist for supine<sit with HOB elevated. Pt able to state the amount of weight he is able to place on L LE: "50%." Reminded him to maintain precautions during functional transfers + stands, with pt responding: "I'll remember that." Mod A for stand pivot<BSC without device. Sit<stand with Mod A using RW to lower brief. Pt required tactile cues for proper hand placement during sit<stand transitions throughout session. Pt had continent void of bowels. He proceeded with bathing/dressing while sitting on BSC, with pt able to eliminate more during. Max A for LB self care due to pain with bending forward. Min A and vcs required for clothing orientation. Vcs for sequencing during bathing as well. After OT completed perihygiene, pt able to elevate pants over hips with Mod A for dynamic balance. Unilateral support on RW at this time. He transferred back to bed using device, Min A for transition to semi reclined for brief rest before oral hygiene. Then he transferred out of bed again to shave and brush his teeth at the sink. Pt required min vcs for motor planning during, and increased time to complete. When  pt returned to bed, OT assisted him with repositioning in sidelying for pressure relief off buttocks. Pt left with all needs within reach and bed alarm set at session exit. Tx focus placed on ADL retraining, functional transfers, standing balance, activity tolerance, and OOB tolerance.   Used tactile cues and the headphone amplifier with small mic (level 4) for most communication due to [facility]   Therapy Documentation Precautions:  Precautions Precautions: Fall Restrictions Weight Bearing Restrictions: Yes LLE Weight Bearing: Partial weight bearing LLE Partial Weight Bearing Percentage or Pounds: until  6/24 and then WBAT Pain: Pain Assessment Pain Scale: 0-10 Pain Score: 0-No pain Pain Type: Acute pain Pain Location: Back Pain Orientation: Right Pain Descriptors / Indicators: Aching;Discomfort Pain Frequency: Constant Pain Onset: On-going Patients Stated Pain Goal: 2 Pain Intervention(s): Medication (See eMAR) ADL:       Therapy/Group: Individual Therapy  Kerria Sapien A Jerico Grisso 07/26/2018, 11:50 AM

## 2018-07-26 NOTE — Progress Notes (Signed)
Physical Therapy Session Note  Patient Details  Name: Ricardo Le MRN: [id number] Date of Birth: 08/21/1940  Today's Date: 07/26/2018 PT Individual Time: 1500-1610 PT Individual Time Calculation (min): 70 min   Short Term Goals: Week 1:  PT Short Term Goal 1 (Week 1): Pt will consistently perform bed<>WC transfers with min assist  PT Short Term Goal 2 (Week 1): Pt will ambulate >32f with min assist and LRAD  PT Short Term Goal 3 (Week 1): Pt will perform all bed mobility with supervision assist.   Skilled Therapeutic Interventions/Progress Updates:   Pt received supine in bed and agreeable to PT after significant encouragement from PT. Supine>sit transfer with supervision assist and heavy use of bed rails. PT assisted pt to perform lower body dressing with min assist  For safety to stand at EOB and for PT to perform clothing management. [facility]. PT reports neck pain 10/10 but able to re-direct transfer task. WC mobility x 1554fwith supervision assist from distance and min cues for turning technique to the R. Gait training with RW completed 2 x 1553fnd x 5f87fth CGA-close supervisoin assist and RW. Antalgic gait pattern noted on the L but only reports pain in neck with movement. Pt instructed sustained attention task of low and moderate difficulty peg board puzzle. Mod-max cues for error detection and attention to task with intermittent complaints of neck pain, but able to be re-directed with min-mod cues to task. Pt able performed sustained attention and problem solving task of checkers x 20mi96ms with min-mod cues for attention to task and problem solving. Pt returned to room and performed stand pivot transfer to bed with CGA and RW. Sit>supine completed with supervision assist, and left supine in bed with call bell in reach and all needs met.          Therapy Documentation Precautions:  Precautions Precautions: Fall Restrictions Weight Bearing  Restrictions: Yes LLE Weight Bearing: Partial weight bearing LLE Partial Weight Bearing Percentage or Pounds: until  6/24 and then WBAT Vital Signs: Therapy Vitals Temp: 98 F (36.7 C) Temp Source: Oral Pulse Rate: 82 Resp: 18 BP: (!) 141/67 Patient Position (if appropriate): Lying Oxygen Therapy SpO2: 97 % O2 Device: Room Air Pain: Pain Assessment Pain Scale: Faces Faces Pain Scale: Hurts even more Pain Type: Acute pain Pain Location: Back Pain Orientation: Right Pain Descriptors / Indicators: Aching;Discomfort Pain Frequency: Constant Pain Onset: On-going Pain Intervention(s): Medication (See eMAR)(tylenol given)    Therapy/Group: Individual Therapy  AustiLorie Phenix/2020, 4:13 PM

## 2018-07-27 ENCOUNTER — Inpatient Hospital Stay (HOSPITAL_COMMUNITY): Payer: PPO | Admitting: Occupational Therapy

## 2018-07-27 ENCOUNTER — Inpatient Hospital Stay (HOSPITAL_COMMUNITY): Payer: PPO | Admitting: Physical Therapy

## 2018-07-27 ENCOUNTER — Inpatient Hospital Stay (HOSPITAL_COMMUNITY): Payer: PPO

## 2018-07-27 DIAGNOSIS — Z794 Long term (current) use of insulin: Secondary | ICD-10-CM

## 2018-07-27 DIAGNOSIS — I2583 Coronary atherosclerosis due to lipid rich plaque: Secondary | ICD-10-CM

## 2018-07-27 DIAGNOSIS — I2510 Atherosclerotic heart disease of native coronary artery without angina pectoris: Secondary | ICD-10-CM

## 2018-07-27 DIAGNOSIS — E1122 Type 2 diabetes mellitus with diabetic chronic kidney disease: Secondary | ICD-10-CM

## 2018-07-27 LAB — GLUCOSE, CAPILLARY
Glucose-Capillary: 201 mg/dL — ABNORMAL HIGH (ref 70–99)
Glucose-Capillary: 276 mg/dL — ABNORMAL HIGH (ref 70–99)
Glucose-Capillary: 167 mg/dL — ABNORMAL HIGH (ref 70–99)
Glucose-Capillary: 198 mg/dL — ABNORMAL HIGH (ref 70–99)

## 2018-07-27 NOTE — Progress Notes (Signed)
Speech Language Pathology Daily Session Note  Patient Details  Name: Ricardo Le MRN: [id number] Date of Birth: May 24, 1940  Today's Date: 07/27/2018 SLP Individual Time: 1000-1109 SLP Individual Time Calculation (min): 69 min  Short Term Goals: Week 1: SLP Short Term Goal 1 (Week 1): Pt will utilize external memory aids to recall orientation information with Min A cues.  SLP Short Term Goal 2 (Week 1): Pt will complete basic familiar problem solving tasks with Min A cues.  SLP Short Term Goal 3 (Week 1): Pt will demonstrate selective atteniton to task for ! 5 minutes with Min A cues.  SLP Short Term Goal 4 (Week 1): Pt will demonstrate intellectual awareness by identifyin 2 physical and 2 cognitive deficits related to acute hospitalization with Mod A cues.   Skilled Therapeutic Interventions:    Skilled ST services focused on education and cognitive skills. SLP attempted to facilitated basic problem solving and recall with external aid, however pt's vision, glasses not present and impairment in recognizing color, resulted in basic money management task and card task being unsuccessful. SLP facilitated basic problem solving skills with large print card sorting task by highest to lowest number (2-A), pt required min A verbal cues. Pt was orientated to place, month and year requiring cues for day/date, SLP then posted calendar in room to aid in orientation. Pt demonstrated awareness of 2 cognitive deficits and 2 physical deficits with mod A verbal cues. SLP assisted pt in calling wife, pt ws unable to hear wife on phone with amplifier, therefore SLP translated information. Pt's wife, requested information about changes in cognition and hearing from fall, SLP provided education pertaining to head injuries and how they can impact cognition on various levels. ALL questions were answered to satisfaction. Pt's wife agreed to send glasses and hearing assist from home for pt to utilize during hospital  stay, later today. Pt was left in room with call bell within reach and bed alarm set. ST recommends to continue skilled ST services.   Pain Pain Assessment Pain Score: 0-No pain  Therapy/Group: Individual Therapy  Dasja Brase  [facility] 07/27/2018, 12:39 PM

## 2018-07-27 NOTE — Progress Notes (Signed)
Ricardo Le is a 78 y.o. male Sep 28, 1940 [id number]  Subjective: No new complaints. No new problems  Objective: Vital signs in last 24 hours: Temp:  [98 F (36.7 C)-98.6 F (37 C)] 98.3 F (36.8 C) (06/13 0413) Pulse Rate:  [77-86] 77 (06/13 0413) Resp:  [18-19] 18 (06/13 0413) BP: (125-141)/(63-70) 125/63 (06/13 0413) SpO2:  [97 %] 97 % (06/13 0413) Weight change:  Last BM Date: 07/26/18  Intake/Output from previous day: 06/12 0701 - 06/13 0700 In: 360 [P.O.:360] Out: 275 [Urine:275] Last cbgs: CBG (last 3)  Recent Labs    07/26/18 1644 07/26/18 2109 07/27/18 0632  GLUCAP 128* 163* 201*     Physical Exam General: No apparent distress.   HEENT: not dry Lungs: Normal effort. Lungs clear to auscultation, no crackles or wheezes. Cardiovascular: Regular rate and rhythm, no edema Abdomen: S/NT/ND; BS(+) Musculoskeletal:  unchanged Neurological: No new neurological deficits Wounds: N/A    Skin: clear  Aging changes Mental state: Asleep    Lab Results: BMET    Component Value Date/Time   NA 129 (L) 07/24/2018 0613   NA 138 07/04/2018 1311   K 3.8 07/24/2018 0613   CL 92 (L) 07/24/2018 0613   CO2 25 07/24/2018 0613   GLUCOSE 134 (H) 07/24/2018 0613   BUN 15 07/24/2018 0613   BUN 21 07/04/2018 1311   CREATININE 0.91 07/24/2018 0613   CALCIUM 8.9 07/24/2018 0613   GFRNONAA >60 07/24/2018 0613   GFRAA >60 07/24/2018 0613   CBC    Component Value Date/Time   WBC 11.9 (H) 07/24/2018 0613   RBC 4.54 07/24/2018 0613   HGB 14.3 07/24/2018 0613   HGB 14.8 07/04/2018 1311   HCT 40.4 07/24/2018 0613   HCT 42.4 07/04/2018 1311   PLT 351 07/24/2018 0613   PLT 279 07/04/2018 1311   MCV 89.0 07/24/2018 0613   MCV 94 07/04/2018 1311   MCH 31.5 07/24/2018 0613   MCHC 35.4 07/24/2018 0613   RDW 11.9 07/24/2018 0613   RDW 12.5 07/04/2018 1311   LYMPHSABS 1.5 07/24/2018 0613   LYMPHSABS 2.6 07/04/2018 1311   MONOABS 1.4 (H) 07/24/2018 0613   EOSABS 0.0  07/24/2018 0613   EOSABS 0.2 07/04/2018 1311   BASOSABS 0.1 07/24/2018 0613   BASOSABS 0.1 07/04/2018 1311    Studies/Results: No results found.  Medications: I have reviewed the patient's current medications.  Assessment/Plan:  1.  Traumatic intraventricular hemorrhage with conservative care as well as left acetabular fracture.  Decreased functional mobility and cognitive deficits.  CIR 2.  DVT prophylaxis with SCDs 3.  Pain management with gabapentin 4.  Hypertension.  On the Ranexa, lisinopril, Norvasc 5.  Diabetes type 2.  On Lantus. 6.  Coronary artery disease with history of bypass surgery - aspirin is on hold. 7.  Dyslipidemia.  On Lipitor 8.  BPH.  On Flomax 9.  GERD on Protonix 10.  Constipation.  On Colace, senna, sorbitol PRN     Length of stay, days: 4  Walker Kehr , MD 07/27/2018, 11:14 AM

## 2018-07-27 NOTE — Progress Notes (Signed)
Physical Therapy Session Note  Patient Details  Name: Ricardo Le MRN: [id number] Date of Birth: 29-Dec-1940  Today's Date: 07/27/2018 PT Individual Time: 1100-1200 PT Individual Time Calculation (min): 60 min   Short Term Goals: Week 1:  PT Short Term Goal 1 (Week 1): Pt will consistently perform bed<>WC transfers with min assist  PT Short Term Goal 2 (Week 1): Pt will ambulate >78f with min assist and LRAD  PT Short Term Goal 3 (Week 1): Pt will perform all bed mobility with supervision assist.   Skilled Therapeutic Interventions/Progress Updates:   Pt received supine in bed and agreeable to PT. Supine>sit transfer with supervision assist and min cues for use fo bed rail. Pt able to don pants at EOB with set up from PT and supervision assist in transfer to standing to pull pants to waist. [facility].   Pt performed WC mobility through hall of rehab unit x 2057fwith supervision assist from PT and min cues for attention to target finish line. No complaints of pain with WC mobility. Mild reports of dizziness initially when sitting in WC.   PT assessed orthostatic vital signs. Sitting 114/59. Standing 0 min 90/55 asymptomatic. Standing 2 minutes 115/65, pt asymptomatic.   Gait trainig with RW x 6056fith CGA for clothing management. Only mild antalgic gait patternt noted with no reports of hip pain.   PT instructed pt in step management with RW to simulate home access 1 x 6inch step. Min assist and moderate cues for AD management, PWB, and step to gait pattern.   Pt instructed in sustained attention task of wii bowling x 15 minutes with mod progressing to min cues for task sequecning and technique. Pt only required min cues for attention to task with no self distracting behaviors.  Patient returned to room and [facility] and all needs met.          Therapy Documentation Precautions:   Precautions Precautions: Fall Restrictions Weight Bearing Restrictions: Yes LLE Weight Bearing: Partial weight bearing LLE Partial Weight Bearing Percentage or Pounds: until  6/24 and then WBAT    Pain: 2/10 nausea. Increased movement.    Therapy/Group: Individual Therapy  AusLorie Phenix13/2020, 12:11 PM

## 2018-07-27 NOTE — Progress Notes (Signed)
Occupational Therapy Session Note  Patient Details  Name: Ricardo Le MRN: [id number] Date of Birth: October 31, 1940  Today's Date: 07/27/2018 OT Individual Time: 1300-1415 OT Individual Time Calculation (min): 75 min   Short Term Goals: Week 1:  OT Short Term Goal 1 (Week 1): Pt will maintain min A standing balance during LB clothing management.  OT Short Term Goal 2 (Week 1): Pt will perform LB self care with mod A overall. OT Short Term Goal 3 (Week 1): Pt will perform UB self care with min A overall.  OT Short Term Goal 4 (Week 1): Pt will perform toilet transfer with min A overall.  Skilled Therapeutic Interventions/Progress Updates:    Pt greeted in w/c finishing up lunch. He used the call bell to request his pain medication at start of session. Pt agreeable to shower. He completed bathing (sit<stand from TTB), dressing (w/c level at sink sit<stand) and oral care/handwashing (standing at sink during session). Shower transfers and sit<stands completed with Min A and vcs for PWB L LE. Per RN, ok to remove bandages on sacrum and B heels to wash. Pt required Min A to wash feet and Min A for standing balance in shower. He completed perihygiene himself. When OT checked for thoroughness, the wash cloth came out clean. During dressing tasks, pt required assist for donning his brief only! Able to don both gripper socks today with steady assist for balance! Vcs provided for hemi dressing strategies when donning pants. Able to stand for 3 minutes at sink while completing self care tasks with steady assist. Afterwards he requested to return to bed. Stand pivot<bed completed with Min A using bedrail. Pt was repositioned for comfort in bed and left with all needs within reach and bed alarm set. Tx focus placed on self care retraining, functional transfers, dynamic balance, and activity tolerance.    We used the headphone amplifier with mic for communication (set at level 6).  IV covered prior to shower.  Post shower, pink foam dressings were reapplied to sacrum and heels once skin was thoroughly dried. RN made aware.  Therapy Documentation Precautions:  Precautions Precautions: Fall Restrictions Weight Bearing Restrictions: Yes LLE Weight Bearing: Partial weight bearing LLE Partial Weight Bearing Percentage or Pounds: until  6/24 and then WBAT Vital Signs: Therapy Vitals Temp: 98.1 F (36.7 C) Temp Source: Oral Pulse Rate: 73 Resp: 20 BP: 119/66 Patient Position (if appropriate): Lying Oxygen Therapy SpO2: 100 % O2 Device: Room Air ADL:       Therapy/Group: Individual Therapy  Eann Cleland A Kolbey Teichert 07/27/2018, 4:45 PM

## 2018-07-28 DIAGNOSIS — E1169 Type 2 diabetes mellitus with other specified complication: Secondary | ICD-10-CM

## 2018-07-28 LAB — GLUCOSE, CAPILLARY
Glucose-Capillary: 138 mg/dL — ABNORMAL HIGH (ref 70–99)
Glucose-Capillary: 167 mg/dL — ABNORMAL HIGH (ref 70–99)
Glucose-Capillary: 303 mg/dL — ABNORMAL HIGH (ref 70–99)
Glucose-Capillary: 250 mg/dL — ABNORMAL HIGH (ref 70–99)

## 2018-07-28 NOTE — Progress Notes (Signed)
Ricardo Le is a 78 y.o. male 12-12-1940 [id number]  Subjective: No new complaints. No new problems. Slept well. Feeling OK.  Objective: Vital signs in last 24 hours: Temp:  [98.1 F (36.7 C)-98.5 F (36.9 C)] 98.4 F (36.9 C) (06/14 0324) Pulse Rate:  [65-74] 65 (06/14 0324) Resp:  [18-20] 19 (06/14 0324) BP: (119-126)/(65-68) 124/65 (06/14 0835) SpO2:  [97 %-100 %] 97 % (06/14 0324) Weight change:  Last BM Date: 07/26/18  Intake/Output from previous day: 06/13 0701 - 06/14 0700 In: 938 [P.O.:938] Out: 600 [Urine:600] Last cbgs: CBG (last 3)  Recent Labs    07/27/18 2110 07/28/18 0619 07/28/18 1118  GLUCAP 198* 138* 167*     Physical Exam General: No apparent distress   HEENT: not dry Lungs: Normal effort. Lungs clear to auscultation, no crackles or wheezes. Cardiovascular: Regular rate and rhythm, no edema Abdomen: S/NT/ND; BS(+) Musculoskeletal:  unchanged Neurological: No new neurological deficits Wounds: N/A    Skin: clear  Aging changes Mental state: Alert,  cooperative    Lab Results: BMET    Component Value Date/Time   NA 129 (L) 07/24/2018 0613   NA 138 07/04/2018 1311   K 3.8 07/24/2018 0613   CL 92 (L) 07/24/2018 0613   CO2 25 07/24/2018 0613   GLUCOSE 134 (H) 07/24/2018 0613   BUN 15 07/24/2018 0613   BUN 21 07/04/2018 1311   CREATININE 0.91 07/24/2018 0613   CALCIUM 8.9 07/24/2018 0613   GFRNONAA >60 07/24/2018 0613   GFRAA >60 07/24/2018 0613   CBC    Component Value Date/Time   WBC 11.9 (H) 07/24/2018 0613   RBC 4.54 07/24/2018 0613   HGB 14.3 07/24/2018 0613   HGB 14.8 07/04/2018 1311   HCT 40.4 07/24/2018 0613   HCT 42.4 07/04/2018 1311   PLT 351 07/24/2018 0613   PLT 279 07/04/2018 1311   MCV 89.0 07/24/2018 0613   MCV 94 07/04/2018 1311   MCH 31.5 07/24/2018 0613   MCHC 35.4 07/24/2018 0613   RDW 11.9 07/24/2018 0613   RDW 12.5 07/04/2018 1311   LYMPHSABS 1.5 07/24/2018 0613   LYMPHSABS 2.6 07/04/2018 1311   MONOABS 1.4 (H) 07/24/2018 0613   EOSABS 0.0 07/24/2018 0613   EOSABS 0.2 07/04/2018 1311   BASOSABS 0.1 07/24/2018 0613   BASOSABS 0.1 07/04/2018 1311    Studies/Results: No results found.  Medications: I have reviewed the patient's current medications.  Assessment/Plan:    1.  Traumatic intraventricular hemorrhage with conservative care as well as left acetabular fracture.  Decreased functional mobility and cognitive deficits.  CIR 2.  DVT prophylaxis with SCDs 3.  Pain management with gabapentin to continue 4.  Hypertension.  Continue with Ranexa, lisinopril, Norvasc 5.  Type 2 diabetes.  Continue Lantus 6.  Coronary artery disease with history of bypass surgery.  Aspirin is on hold.  On Ranexa, Norvasc 7.  Dyslipidemia.  Continue Lipitor 8.  BPH on Flomax 9.  GERD.  Continue Protonix 10.  Constipation.  Continue Colace, senna, sorbitol as needed     Length of stay, days: 5  Walker Kehr , MD 07/28/2018, 12:16 PM

## 2018-07-29 ENCOUNTER — Inpatient Hospital Stay (HOSPITAL_COMMUNITY): Payer: PPO | Admitting: Occupational Therapy

## 2018-07-29 ENCOUNTER — Inpatient Hospital Stay (HOSPITAL_COMMUNITY): Payer: PPO | Admitting: Physical Therapy

## 2018-07-29 ENCOUNTER — Inpatient Hospital Stay (HOSPITAL_COMMUNITY): Payer: PPO | Admitting: Speech Pathology

## 2018-07-29 LAB — GLUCOSE, CAPILLARY
Glucose-Capillary: 145 mg/dL — ABNORMAL HIGH (ref 70–99)
Glucose-Capillary: 275 mg/dL — ABNORMAL HIGH (ref 70–99)
Glucose-Capillary: 86 mg/dL (ref 70–99)
Glucose-Capillary: 238 mg/dL — ABNORMAL HIGH (ref 70–99)

## 2018-07-29 MED ORDER — GLIPIZIDE 5 MG PO TABS
2.5000 mg | ORAL_TABLET | Freq: Every day | ORAL | Status: DC
  Administered 2018-07-29 – 2018-07-30 (×2): 2.5 mg via ORAL
  Filled 2018-07-29: qty 1

## 2018-07-29 NOTE — Progress Notes (Signed)
Occupational Therapy Session Note  Patient Details  Name: Ricardo Le MRN: [id number] Date of Birth: 05/23/1940  Today's Date: 07/29/2018 OT Individual Time: 5176-1607 and 3710-6269 OT Individual Time Calculation (min): 28 min and 70 min   Short Term Goals: Week 1:  OT Short Term Goal 1 (Week 1): Pt will maintain min A standing balance during LB clothing management.  OT Short Term Goal 2 (Week 1): Pt will perform LB self care with mod A overall. OT Short Term Goal 3 (Week 1): Pt will perform UB self care with min A overall.  OT Short Term Goal 4 (Week 1): Pt will perform toilet transfer with min A overall.  Skilled Therapeutic Interventions/Progress Updates:    Pt greeted in w/c and premedicated for pain. Reporting that he had a rough morning with multiple bouts of diarrhea after eating breakfast. He did not have to use the restroom upon OT arrival. He wanted to brush his teeth at the sink. Sit<stand with steady assist, and steady assist for standing balance while he completed oral care, hand washing, and face washing. Before standing, pt reported he was supposed to put "half" the weight on his L LE. While standing, provided reminders to maintain PWB precautions. After 1st reminder, pt maintained noticeable Rt weight-shift throughout stated tasks. When he returned to sitting, had pt select the clothing items he wanted to wear for shower during 2nd OT session. Pt self propelled the w/c beside dresser. Vcs for locking brakes prior to dynamic activity, with pt reaching towards 2nd drawer. He picked out his pants, shirt, and brief with mod vcs, question if more cuing was needed due to [facility] vs comprehension (I.e. when asked to select brief, he picked up shirt, asking "is that it?"). At end of session pt wanted to remain in w/c. Wrapped him up with blankets due to feeling cold. Placed an instant hot pack within blankets with pt reporting "That feels so good." He was left in w/c with all needs within  reach and chair alarm set.   Used the Set designer with mic for communication, set at level 3 (settings 4-6 were too loud to him today)  2nd Session 1:1 tx (70 min)  Pt greeted in w/c. He remembered that we planned for showering this session. Also remembered where we had placed his clothing. He completed bathing (utilizing lateral leans on TTB) and dressing (w/c level at sink, sit<stand) during session. Tx focus placed on precaution adherence, ADL retraining, functional transfers, and activity tolerance. All functional transfers completed at ambulatory level using RW with Min A. Before all sit<stands, asked pt how much weight he was supposed to put on his Lt leg, with pt responding "about half." Steady assist for leans during perihygiene, with pt keeping Lt foot off of floor when leaning towards Lt. Pt required Min A to wash feet during shower. Min A also required to don gripper socks due to dressings on heels. During dressing tasks, pt able to utilize seated figure 4 to thread pants, dressing Lt leg first. Min vcs for PWB L LE when standing to elevate pants. Pt increased weightshift towards Rt with reminder. He then returned to w/c. Pt expressed concerns regarding eating because the food is giving him stomach upset. Encouraged pt to eat what he could to keep his strength up. Relayed concerns to RN. At end of session pt was left in w/c with all needs within reach, wrapped up in blankets, safety belt fastened.   OT changed B heel and  sacral pink foam dressings post shower. RN made aware.     Therapy Documentation Precautions:  Precautions Precautions: Fall Restrictions Weight Bearing Restrictions: Yes LLE Weight Bearing: Partial weight bearing LLE Partial Weight Bearing Percentage or Pounds: until  6/24 and then WBAT Pain: Pt medicated for pain during 2nd session as well    ADL:     Therapy/Group: Individual Therapy  Jovin Fester A Narya Beavin 07/29/2018, 12:07 PM

## 2018-07-29 NOTE — Progress Notes (Signed)
Physical Therapy Session Note  Patient Details  Name: [name] MRN: [id number] Date of Birth: [birth year]  Today's Date: 07/29/2018 PT Individual Time: 1430-1535 PT Individual Time Calculation (min): 65 min   Short Term Goals: Week 1:  PT Short Term Goal 1 (Week 1): Pt will consistently perform bed<>WC transfers with min assist  PT Short Term Goal 2 (Week 1): Pt will ambulate >56f with min assist and LRAD  PT Short Term Goal 3 (Week 1): Pt will perform all bed mobility with supervision assist.   Skilled Therapeutic Interventions/Progress Updates: Pt presented in w/c agreeable to therapy, asking if he was going home. Pt denies pain but states some nausea which he attributes to food he ate. Pt transported to ortho gym and performed car transfer with CGA. Pt propelled w/c to ADL apt supervision level and performed ambulatory transfer to/from couch with pt able to perform STS from couch with CGA and cues for using arm rest to push off. Pt ambulated to rehab gym and participated in curb training with pt able to perform x 2 with CGA and correct sequencing. Participated in ambulation via obstacle course weaving through cones as well as stepping over threshold and stepping onto compliant surface. Also ambulated on compliant surface x 11fwith RW and CGA. Pt returned to w/c and transported back to room and performed bed mobility on flat bed without features at supervision level. Pt initially agreeable to return to w/c then decided wanted to return to bed. Once in bed pt able to reposition to comfort and left with bed alarm on, call bell within reach and needs met.      Therapy Documentation Precautions:  Precautions Precautions: Fall Restrictions Weight Bearing Restrictions: Yes LLE Weight Bearing: Partial weight bearing LLE Partial Weight Bearing Percentage or Pounds: until  6/24 and then WBAT General:   Vital Signs: Therapy Vitals Temp: 97.7 F (36.5 C) Pulse Rate: 88 Resp: 20 BP:  115/66 Patient Position (if appropriate): Sitting Oxygen Therapy SpO2: 99 % O2 Device: Room Air Pain: Pain Assessment Pain Scale: 0-10 Pain Score: 4  Pain Type: Acute pain Pain Location: Head Pain Descriptors / Indicators: Headache Pain Onset: On-going Patients Stated Pain Goal: 2 Pain Intervention(s): Emotional support;Relaxation Multiple Pain Sites: No   Therapy/Group: Individual Therapy  Samaira Holzworth  Maycee Blasco, PTA  07/29/2018, 4:28 PM

## 2018-07-29 NOTE — Progress Notes (Signed)
Speech Language Pathology Daily Session Note  Patient Details  Name: Ricardo Le MRN: [id number] Date of Birth: 05/17/1940  Today's Date: 07/29/2018 SLP Individual Time: 4818-5631 SLP Individual Time Calculation (min): 30 min  Short Term Goals: Week 1: SLP Short Term Goal 1 (Week 1): Pt will utilize external memory aids to recall orientation information with Min A cues.  SLP Short Term Goal 2 (Week 1): Pt will complete basic familiar problem solving tasks with Min A cues.  SLP Short Term Goal 3 (Week 1): Pt will demonstrate selective atteniton to task for ! 5 minutes with Min A cues.  SLP Short Term Goal 4 (Week 1): Pt will demonstrate intellectual awareness by identifyin 2 physical and 2 cognitive deficits related to acute hospitalization with Mod A cues.   Skilled Therapeutic Interventions: Skilled treatment session focused on cognition goals. SLP facilitated session by providing Max A cues to replicate very basic peg design task. Pt with difficulty in all aspect of task. He frequently provided in consistent excuses to explain difficulty. However, as SLP compensated for each excuse, pt created more excuses. Suspect that at baseline, pt performed self-directed tasks such as planting a garden instead of structured cognitive task provided in therapy. Additionally, in conversations with pt's family, they state that pt was frequently "babyed" by all family. Therefore suspect some baseline deficits that pt and family compensated for and as result pt has not demonstrated ability to learn new tasks. Pt left upright in wheelchair, lapbelt alarm on and all needs within reach. Continue per current plan of care.      Pain Pain Assessment Pain Scale: 0-10 Pain Score: 4  Pain Type: Acute pain Pain Location: Head Pain Descriptors / Indicators: Headache Pain Onset: On-going Patients Stated Pain Goal: 2 Pain Intervention(s): Emotional support;Relaxation Multiple Pain Sites: No  Therapy/Group:  Individual Therapy  Caylan Chenard 07/29/2018, 5:06 PM

## 2018-07-29 NOTE — Discharge Summary (Signed)
Physician Discharge Summary  Patient ID: Ricardo Le MRN: [id number] DOB/AGE: 08/07/1940 78 y.o.  Admit date: 07/23/2018 Discharge date: 07/30/2018  Discharge Diagnoses:  Active Problems:   Traumatic intraventricular hemorrhage (HCC) DVT prophylaxis Left acetabular fracture Pain management Hypertension History of diabetes mellitus CAD with CABG Hyperlipidemia BPH Stage II pressure injury to coccyx Bilateral stage I pressure injury to heels  Discharged Condition: Stable  Significant Diagnostic Studies: Dg Chest 1 View  Result Date: 07/08/2018 CLINICAL DATA:  Fall from ladder with chest pain, initial encounter EXAM: CHEST  1 VIEW COMPARISON:  07/04/2018 FINDINGS: Cardiac shadow is within normal limits. Postsurgical changes. The lungs are well aerated with mild left basilar atelectasis. No focal infiltrate is seen. No acute bony is noted. IMPRESSION: Mild left basilar atelectasis. Electronically Signed   By: Inez Catalina M.D.   On: 07/08/2018 21:02   Dg Chest 2 View  Result Date: 07/04/2018 CLINICAL DATA:  Increasing shortness of breath over the past month. EXAM: CHEST - 2 VIEW COMPARISON:  CT chest dated March 01, 2018. Chest x-ray dated January 29, 2018. FINDINGS: The heart size and mediastinal contours are within normal limits. Prior CABG. Normal pulmonary vascularity. Unchanged elevation of the right hemidiaphragm. Persistent low lung volumes. No focal consolidation, pleural effusion, or pneumothorax. No acute osseous abnormality. Air within the colon hepatic flexure underneath the right hemidiaphragm. IMPRESSION: No active cardiopulmonary disease. Electronically Signed   By: Titus Dubin M.D.   On: 07/04/2018 13:04   Ct Head Wo Contrast  Result Date: 07/23/2018 CLINICAL DATA:  Head trauma, headache EXAM: CT HEAD WITHOUT CONTRAST TECHNIQUE: Contiguous axial images were obtained from the base of the skull through the vertex without intravenous contrast. COMPARISON:   07/21/2018, 07/20/2018 FINDINGS: Brain: No significant interval change in layering blood product within the lateral ventricles. Mild prominence of the lateral ventricles and periventricular white matter hypodensity is unchanged from prior. No evidence of overt hydrocephalus. No evidence of new hemorrhage. Vascular: No hyperdense vessel or unexpected calcification. Skull: Normal. Negative for fracture or focal lesion. Sinuses/Orbits: No acute finding. Other: None. IMPRESSION: No significant interval change in layering blood product within the lateral ventricles. Mild prominence of the lateral ventricles and periventricular white matter hypodensity is unchanged from prior. No evidence of overt hydrocephalus. No evidence of new hemorrhage. Electronically Signed   By: Eddie Candle M.D.   On: 07/23/2018 13:10   Ct Head Wo Contrast  Result Date: 07/21/2018 CLINICAL DATA:  Intracranial hemorrhage follow up EXAM: CT HEAD WITHOUT CONTRAST TECHNIQUE: Contiguous axial images were obtained from the base of the skull through the vertex without intravenous contrast. COMPARISON:  Head CT 07/20/2018 FINDINGS: Brain: Unchanged distribution of intraventricular blood within the lateral ventricles. No new site of hemorrhage. No midline shift or other mass effect. There is periventricular hypoattenuation compatible with chronic microvascular disease. Size and configuration of the ventricles are unchanged. Vascular: Atherosclerotic calcification of the internal carotid arteries at the skull base. No abnormal hyperdensity of the major intracranial arteries or dural venous sinuses. Skull: The visualized skull base, calvarium and extracranial soft tissues are normal. Sinuses/Orbits: No fluid levels or advanced mucosal thickening of the visualized paranasal sinuses. No mastoid or middle ear effusion. The orbits are normal. IMPRESSION: Unchanged intraventricular blood.  No new site of hemorrhage. Electronically Signed   By: Ulyses Jarred  M.D.   On: 07/21/2018 06:58   Ct Head Wo Contrast  Result Date: 07/20/2018 CLINICAL DATA:  78 year old and recent fall.  Decreased hearing. EXAM: CT  HEAD WITHOUT CONTRAST TECHNIQUE: Contiguous axial images were obtained from the base of the skull through the vertex without intravenous contrast. COMPARISON:  Brain MRI 05/29/2014 FINDINGS: Brain: Intraventricular hemorrhage in the posterior aspect of the lateral ventricles, left side greater than right. Largest amount of blood is probably involving the left choroid plexus and could represent a shear injury. Dilatation of the lateral ventricles has mildly progressed since 2016. In addition, there is concern for a small amount of subarachnoid hemorrhage in the left frontal lobe on sequence 3, image 15. Low density in the periventricular and subcortical white matter is similar to the previous MRI findings. There is no evidence for midline shift, mass lesion or large infarct. Pituitary gland is mildly prominent but similar to the previous MRI findings. Vascular: No hyperdense vessel or unexpected calcification. Skull: Normal. Negative for fracture or focal lesion. Sinuses/Orbits: Mucosal disease in the ethmoid air cells. Probable mucosal disease in the posterior right sphenoid sinus. Other: None IMPRESSION: 1. Intraventricular hemorrhage involving bilateral lateral ventricles, left side greater than right. Findings could be related to a shear injury involving the left choroid plexus. There is also concern for a small amount of subarachnoid blood in the left frontal lobe. 2. Dilatation of the lateral ventricles has mildly progressed since 2016. No evidence for obstructive hydrocephalus. Critical Value/emergent results were called by telephone at the time of interpretation on 07/20/2018 at 6:30 pm to Dr. Davonna Belling , who verbally acknowledged these results. Electronically Signed   By: Markus Daft M.D.   On: 07/20/2018 18:34   Ct Angio Chest Pe W And/or Wo  Contrast  Result Date: 07/09/2018 CLINICAL DATA:  Initial evaluation for acute chest pain, abdominal pain. EXAM: CT ANGIOGRAPHY CHEST CT ABDOMEN AND PELVIS WITH CONTRAST TECHNIQUE: Multidetector CT imaging of the chest was performed using the standard protocol during bolus administration of intravenous contrast. Multiplanar CT image reconstructions and MIPs were obtained to evaluate the vascular anatomy. Multidetector CT imaging of the abdomen and pelvis was performed using the standard protocol during bolus administration of intravenous contrast. CONTRAST:  167mL OMNIPAQUE IOHEXOL 350 MG/ML SOLN COMPARISON:  Prior CT from 03/01/2018 FINDINGS: CTA CHEST FINDINGS Cardiovascular: Intrathoracic aorta of normal caliber without aneurysm or other acute abnormality. Moderate aortic atherosclerosis. Visualized great vessels tortuous but within normal limits. Mild cardiomegaly no pericardial effusion. Pulmonary arterial tree adequately opacified for evaluation. Main pulmonary artery dilated up to 3.4 cm, suggesting underlying pulmonary hypertension. No filling defect to suggest acute pulmonary embolism. Re-formatted imaging confirms these findings. Mediastinum/Nodes: Visualized thyroid within normal limits. No enlarged mediastinal, hilar, or axillary lymph nodes. Esophagus within normal limits. Lungs/Pleura: Tracheobronchial tree intact and patent. Diffuse central airway thickening noted. Lungs hypoinflated bilaterally with elevation of the hemidiaphragms. Trace layering bilateral pleural effusions, left slightly larger than right. Associated bibasilar atelectasis. No other focal infiltrates. No pulmonary edema. No pneumothorax. Few scattered subpleural subcentimeter nodular densities measuring up to 5 mm noted within the peripheral upper lobes bilaterally, indeterminate. Musculoskeletal: Median sternotomy wires noted. No acute osseous abnormality. No worrisome lytic or blastic osseous lesions. Review of the MIP images  confirms the above findings. CT ABDOMEN and PELVIS FINDINGS Hepatobiliary: Liver demonstrates a normal contrast enhanced appearance. Punctate granuloma noted within the caudate lobe. Gallbladder surgically absent. No biliary dilatation. Pancreas: Sequelae of prior distal pancreatectomy noted. Pancreas otherwise unremarkable. Spleen: Spleen is absent. Adrenals/Urinary Tract: Adrenal glands within normal limits. Kidneys equal in size with symmetric enhancement. Focal cortical thinning and scarring noted at the lower pole the  right kidney. 6 mm nonobstructive stone present at the lower pole the right kidney. No other radiopaque calculi. No hydronephrosis or hydroureter. No focal enhancing renal mass. Partially distended bladder within normal limits. Stomach/Bowel: Stomach decompressed without acute finding. No evidence for bowel obstruction. Large volume retained stool throughout the colon, suggesting constipation. Negative appendix. No acute inflammatory changes seen about the bowels. Vascular/Lymphatic: Advanced aorto bi-iliac atherosclerotic disease. No aneurysm. Mesenteric vessels patent proximally. Two right-sided renal arteries noted. Retroaortic left renal vein noted as well. No adenopathy. Reproductive: Enlarged prostate measuring 6.3 cm in transverse diameter. Other: No free air or fluid. Musculoskeletal: Additional views of the left hip demonstrate an acute nondisplaced fracture through the superior and posterior aspect of the left acetabulum (series 6, image 25). Subtle medial extension towards the iliopectineal line (series 10, image 42). Anterior column intact. Left femoral head remains normally positioned and remains intact. Femoral neck and visualized proximal femur intact. Remainder of the visualized pelvis intact. No pubic diastasis. SI joints approximated. No other acute osseous abnormality. No discrete lytic or blastic osseous lesions. Patient status post posterior decompression at L2 through L4.  Moderate to advanced multilevel degenerative spondylolysis throughout the lumbar spine. Review of the MIP images confirms the above findings. IMPRESSION: CTA CHEST IMPRESSION 1. No CT evidence for acute pulmonary embolism. 2. Low lung volumes with associated trace layering bilateral pleural effusions and bibasilar atelectasis. No other active cardiopulmonary disease. 3. Central airway thickening with dilatation of the main pulmonary artery up to 3.4 cm, suggesting a degree of underlying pulmonary hypertension. CT ABDOMEN AND PELVIS IMPRESSION 1. Acute nondisplaced left acetabular fracture as above. 2. No other acute abnormality within the abdomen and pelvis. 3. 6 mm nonobstructive right renal nephrolithiasis. 4. Large volume retained stool throughout the colon, suggesting constipation. 5. Advanced atherosclerosis with diffuse 3 vessel coronary artery calcifications. Electronically Signed   By: Jeannine Boga M.D.   On: 07/09/2018 03:35   Ct Abdomen Pelvis W Contrast  Result Date: 07/09/2018 CLINICAL DATA:  Initial evaluation for acute chest pain, abdominal pain. EXAM: CT ANGIOGRAPHY CHEST CT ABDOMEN AND PELVIS WITH CONTRAST TECHNIQUE: Multidetector CT imaging of the chest was performed using the standard protocol during bolus administration of intravenous contrast. Multiplanar CT image reconstructions and MIPs were obtained to evaluate the vascular anatomy. Multidetector CT imaging of the abdomen and pelvis was performed using the standard protocol during bolus administration of intravenous contrast. CONTRAST:  178mL OMNIPAQUE IOHEXOL 350 MG/ML SOLN COMPARISON:  Prior CT from 03/01/2018 FINDINGS: CTA CHEST FINDINGS Cardiovascular: Intrathoracic aorta of normal caliber without aneurysm or other acute abnormality. Moderate aortic atherosclerosis. Visualized great vessels tortuous but within normal limits. Mild cardiomegaly no pericardial effusion. Pulmonary arterial tree adequately opacified for evaluation.  Main pulmonary artery dilated up to 3.4 cm, suggesting underlying pulmonary hypertension. No filling defect to suggest acute pulmonary embolism. Re-formatted imaging confirms these findings. Mediastinum/Nodes: Visualized thyroid within normal limits. No enlarged mediastinal, hilar, or axillary lymph nodes. Esophagus within normal limits. Lungs/Pleura: Tracheobronchial tree intact and patent. Diffuse central airway thickening noted. Lungs hypoinflated bilaterally with elevation of the hemidiaphragms. Trace layering bilateral pleural effusions, left slightly larger than right. Associated bibasilar atelectasis. No other focal infiltrates. No pulmonary edema. No pneumothorax. Few scattered subpleural subcentimeter nodular densities measuring up to 5 mm noted within the peripheral upper lobes bilaterally, indeterminate. Musculoskeletal: Median sternotomy wires noted. No acute osseous abnormality. No worrisome lytic or blastic osseous lesions. Review of the MIP images confirms the above findings. CT ABDOMEN and PELVIS  FINDINGS Hepatobiliary: Liver demonstrates a normal contrast enhanced appearance. Punctate granuloma noted within the caudate lobe. Gallbladder surgically absent. No biliary dilatation. Pancreas: Sequelae of prior distal pancreatectomy noted. Pancreas otherwise unremarkable. Spleen: Spleen is absent. Adrenals/Urinary Tract: Adrenal glands within normal limits. Kidneys equal in size with symmetric enhancement. Focal cortical thinning and scarring noted at the lower pole the right kidney. 6 mm nonobstructive stone present at the lower pole the right kidney. No other radiopaque calculi. No hydronephrosis or hydroureter. No focal enhancing renal mass. Partially distended bladder within normal limits. Stomach/Bowel: Stomach decompressed without acute finding. No evidence for bowel obstruction. Large volume retained stool throughout the colon, suggesting constipation. Negative appendix. No acute inflammatory  changes seen about the bowels. Vascular/Lymphatic: Advanced aorto bi-iliac atherosclerotic disease. No aneurysm. Mesenteric vessels patent proximally. Two right-sided renal arteries noted. Retroaortic left renal vein noted as well. No adenopathy. Reproductive: Enlarged prostate measuring 6.3 cm in transverse diameter. Other: No free air or fluid. Musculoskeletal: Additional views of the left hip demonstrate an acute nondisplaced fracture through the superior and posterior aspect of the left acetabulum (series 6, image 25). Subtle medial extension towards the iliopectineal line (series 10, image 42). Anterior column intact. Left femoral head remains normally positioned and remains intact. Femoral neck and visualized proximal femur intact. Remainder of the visualized pelvis intact. No pubic diastasis. SI joints approximated. No other acute osseous abnormality. No discrete lytic or blastic osseous lesions. Patient status post posterior decompression at L2 through L4. Moderate to advanced multilevel degenerative spondylolysis throughout the lumbar spine. Review of the MIP images confirms the above findings. IMPRESSION: CTA CHEST IMPRESSION 1. No CT evidence for acute pulmonary embolism. 2. Low lung volumes with associated trace layering bilateral pleural effusions and bibasilar atelectasis. No other active cardiopulmonary disease. 3. Central airway thickening with dilatation of the main pulmonary artery up to 3.4 cm, suggesting a degree of underlying pulmonary hypertension. CT ABDOMEN AND PELVIS IMPRESSION 1. Acute nondisplaced left acetabular fracture as above. 2. No other acute abnormality within the abdomen and pelvis. 3. 6 mm nonobstructive right renal nephrolithiasis. 4. Large volume retained stool throughout the colon, suggesting constipation. 5. Advanced atherosclerosis with diffuse 3 vessel coronary artery calcifications. Electronically Signed   By: Jeannine Boga M.D.   On: 07/09/2018 03:35   Dg Chest  Portable 1 View  Result Date: 07/20/2018 CLINICAL DATA:  Chest pain. EXAM: PORTABLE CHEST 1 VIEW COMPARISON:  CT chest dated Jul 09, 2018. Chest x-ray dated Jul 08, 2018. FINDINGS: The heart size and mediastinal contours are within normal limits. Prior CABG. Atherosclerotic calcification of the aortic arch. Normal pulmonary vascularity. Persistent low lung volumes with mild bibasilar atelectasis. No focal consolidation, pleural effusion, or pneumothorax. No acute osseous abnormality. IMPRESSION: 1. Persistent low lung volumes with mild bibasilar atelectasis. Electronically Signed   By: Titus Dubin M.D.   On: 07/20/2018 18:02   Ct No Charge  Result Date: 07/09/2018 CLINICAL DATA:  Initial evaluation for acute chest pain, abdominal pain. EXAM: CT ANGIOGRAPHY CHEST CT ABDOMEN AND PELVIS WITH CONTRAST TECHNIQUE: Multidetector CT imaging of the chest was performed using the standard protocol during bolus administration of intravenous contrast. Multiplanar CT image reconstructions and MIPs were obtained to evaluate the vascular anatomy. Multidetector CT imaging of the abdomen and pelvis was performed using the standard protocol during bolus administration of intravenous contrast. CONTRAST:  127mL OMNIPAQUE IOHEXOL 350 MG/ML SOLN COMPARISON:  Prior CT from 03/01/2018 FINDINGS: CTA CHEST FINDINGS Cardiovascular: Intrathoracic aorta of normal caliber without aneurysm  or other acute abnormality. Moderate aortic atherosclerosis. Visualized great vessels tortuous but within normal limits. Mild cardiomegaly no pericardial effusion. Pulmonary arterial tree adequately opacified for evaluation. Main pulmonary artery dilated up to 3.4 cm, suggesting underlying pulmonary hypertension. No filling defect to suggest acute pulmonary embolism. Re-formatted imaging confirms these findings. Mediastinum/Nodes: Visualized thyroid within normal limits. No enlarged mediastinal, hilar, or axillary lymph nodes. Esophagus within normal  limits. Lungs/Pleura: Tracheobronchial tree intact and patent. Diffuse central airway thickening noted. Lungs hypoinflated bilaterally with elevation of the hemidiaphragms. Trace layering bilateral pleural effusions, left slightly larger than right. Associated bibasilar atelectasis. No other focal infiltrates. No pulmonary edema. No pneumothorax. Few scattered subpleural subcentimeter nodular densities measuring up to 5 mm noted within the peripheral upper lobes bilaterally, indeterminate. Musculoskeletal: Median sternotomy wires noted. No acute osseous abnormality. No worrisome lytic or blastic osseous lesions. Review of the MIP images confirms the above findings. CT ABDOMEN and PELVIS FINDINGS Hepatobiliary: Liver demonstrates a normal contrast enhanced appearance. Punctate granuloma noted within the caudate lobe. Gallbladder surgically absent. No biliary dilatation. Pancreas: Sequelae of prior distal pancreatectomy noted. Pancreas otherwise unremarkable. Spleen: Spleen is absent. Adrenals/Urinary Tract: Adrenal glands within normal limits. Kidneys equal in size with symmetric enhancement. Focal cortical thinning and scarring noted at the lower pole the right kidney. 6 mm nonobstructive stone present at the lower pole the right kidney. No other radiopaque calculi. No hydronephrosis or hydroureter. No focal enhancing renal mass. Partially distended bladder within normal limits. Stomach/Bowel: Stomach decompressed without acute finding. No evidence for bowel obstruction. Large volume retained stool throughout the colon, suggesting constipation. Negative appendix. No acute inflammatory changes seen about the bowels. Vascular/Lymphatic: Advanced aorto bi-iliac atherosclerotic disease. No aneurysm. Mesenteric vessels patent proximally. Two right-sided renal arteries noted. Retroaortic left renal vein noted as well. No adenopathy. Reproductive: Enlarged prostate measuring 6.3 cm in transverse diameter. Other: No free  air or fluid. Musculoskeletal: Additional views of the left hip demonstrate an acute nondisplaced fracture through the superior and posterior aspect of the left acetabulum (series 6, image 25). Subtle medial extension towards the iliopectineal line (series 10, image 42). Anterior column intact. Left femoral head remains normally positioned and remains intact. Femoral neck and visualized proximal femur intact. Remainder of the visualized pelvis intact. No pubic diastasis. SI joints approximated. No other acute osseous abnormality. No discrete lytic or blastic osseous lesions. Patient status post posterior decompression at L2 through L4. Moderate to advanced multilevel degenerative spondylolysis throughout the lumbar spine. Review of the MIP images confirms the above findings. IMPRESSION: CTA CHEST IMPRESSION 1. No CT evidence for acute pulmonary embolism. 2. Low lung volumes with associated trace layering bilateral pleural effusions and bibasilar atelectasis. No other active cardiopulmonary disease. 3. Central airway thickening with dilatation of the main pulmonary artery up to 3.4 cm, suggesting a degree of underlying pulmonary hypertension. CT ABDOMEN AND PELVIS IMPRESSION 1. Acute nondisplaced left acetabular fracture as above. 2. No other acute abnormality within the abdomen and pelvis. 3. 6 mm nonobstructive right renal nephrolithiasis. 4. Large volume retained stool throughout the colon, suggesting constipation. 5. Advanced atherosclerosis with diffuse 3 vessel coronary artery calcifications. Electronically Signed   By: Jeannine Boga M.D.   On: 07/09/2018 03:35   Dg Hip Unilat W Or Wo Pelvis 2-3 Views Left  Result Date: 07/08/2018 CLINICAL DATA:  Recent fall from ladder with left hip pain, initial encounter EXAM: DG HIP (WITH OR WITHOUT PELVIS) 3V LEFT COMPARISON:  05/19/2014 FINDINGS: Pelvic ring is intact. Degenerative changes of  lumbar spine and hip joints are noted. No acute fractures noted.  IMPRESSION: No fracture identified. Electronically Signed   By: Inez Catalina M.D.   On: 07/08/2018 21:03    Labs:  Basic Metabolic Panel: Recent Labs  Lab 07/24/18 0613  NA 129*  K 3.8  CL 92*  CO2 25  GLUCOSE 134*  BUN 15  CREATININE 0.91  CALCIUM 8.9    CBC: Recent Labs  Lab 07/24/18 0613  WBC 11.9*  NEUTROABS 8.9*  HGB 14.3  HCT 40.4  MCV 89.0  PLT 351    CBG: Recent Labs  Lab 07/28/18 2105 07/29/18 0603 07/29/18 1156 07/29/18 1641 07/29/18 2113  GLUCAP 250* 145* 275* 60 238*   Family history.  Brother with type 2 diabetes mellitus.  Sister with CAD.  No family history of colon cancer  Brief HPI: Ricardo Le is a 78 year old right-handed male history of diabetes mellitus, CAD with CABG 2009, hypertension.  Recent admission 07/08/2018 07/11/2018 after recent fall sustaining a left acetabular fracture that was non-operative followed by Dr. Edmonia Lynch.  Touchdown weightbearing.  Also completed CT angiogram of the chest that was negative for pulmonary emboli.  He was discharged home 07/11/2018 ambulating minimal assist using a rolling walker.  He lives with spouse independent prior to initial fall.  Presented 07/20/2018 with headache and altered mental status.  Additional falls noted since most recent event.  Cranial CT scan showed intraventricular hemorrhage involving bilateral lateral ventricles, left side greater than right.  There was also concern for small amount of subarachnoid blood in the left frontal lobe.  Neurosurgery Dr. Sherley Bounds consulted advise conservative care follow-up CT scan unchanged.  No hydrocephalus or mass-effect.  Patient had been on aspirin prior to admission held due to hemorrhage.  Noted gradual hearing loss post fall recommendations were for outpatient ENT follow-up.  Patient with persistent confusion family had been cleared to stay with patient.  Again follow-up CT of the head completed 07/23/2018 showing no new changes.  Patient was  admitted for a comprehensive rehab program  Hospital Course: Ricardo Le was admitted to rehab 07/23/2018 for inpatient therapies to consist of PT, ST and OT at least three hours five days a week. Past admission physiatrist, therapy team and rehab RN have worked together to provide customized collaborative inpatient rehab.  Pertaining to patient traumatic intraventricular hemorrhage conservative care follow-up with Dr. Sherley Bounds legs cranial CT scan unchanged.  Recent left acetabular fracture nonoperative weightbearing had been advanced to 50% left lower extremity x2 weeks then progressed to full weightbearing on 08/06/2018.  SCDs for DVT prophylaxis.  Pain management Neurontin.  Mood stabilization he was using melatonin at night.  Stage II pressure injury to the coccyx bilateral stage I pressure injury to the heels routine skin care.  Blood pressure control with lisinopril and Norvasc.  Patient would follow-up with primary MD.  He had been on Ranexa in the past but discontinued due to headaches.  Blood sugar stable hemoglobin A1c 7.9 Lantus insulin as directed.  Patient again would follow-up with primary MD.  Low-dose Glucotrol was resumed.  Noted history of CAD with CABG no chest pain or shortness of breath aspirin on hold.  Lipitor for hyperlipidemia.  Flomax ongoing for BPH void without difficulty.  Physical exam.  Blood pressure 122/61 pulse 66 temperature 98 respirations 18 oxygen saturations 95% room air Neurological.  Alert complaints of headache daughter at bedside.  Provides name and age and follows simple commands General.  No  acute distress mood was a bit flat Heart.  Regular rate and rhythm no rubs murmurs or extra sounds Lungs.  Clear to auscultation breathing unlabored no rales or wheezes Abdomen.  Positive bowel sounds soft nontender to palpation nondistended Extremities.  No clubbing cyanosis or edema Neurological.  Cranial nerves II through XII intact motor strength 4 out of 5  bilateral deltoid bicep tricep grip hip flexors knee extensors ankle dorsi and plantar flexion.  Cerebellar exam bilateral tremor finger-to-nose.  Full range of motion all 4 extremities. Stage II pressure injury to coccyx and bilateral stage I pressure injury to heels.  Rehab course: During patient's stay in rehab weekly team conferences were held to monitor patient's progress, set goals and discuss barriers to discharge. At admission, patient required.  +2 physical assist sit to stand, moderate assist stand pivot transfers, moderate assist supine to sit.  Set up for upper body bathing moderate assist lower body bathing moderate assist lower body dressing moderate assist toilet transfers  He  has had improvement in activity tolerance, balance, postural control as well as ability to compensate for deficits. He has had improvement in functional use RUE/LUE  and RLE/LLE as well as improvement in awareness.  Supine to sit transfers with supervision assist and minimal cues.  Patient able to don pants at edge of bed and set up from physical therapy and supervision assist and transfers to standing to pull pants to waist.  Wheelchair mobility supervision assist ambulating 60 feet contact-guard assist.  Sit to stand with ADLs with steady assistance and steady assist for standing balance while he completed oral care, hand washing and face washing.  He was able to maintain his weightbearing precautions.  He can pick up his pants, shirt and breeze with moderate verbal cues.  Mental status continued to improve patient demonstrated awareness of 2 cognitive deficits and 2 physical deficits with moderate assist and verbal cues.  In regards to progressive hearing loss he would follow-up with ENT.  Family had requested discharge to home after family teaching completed.       Disposition: Discharge to home   Diet: Diabetic diet  Special Instructions: No driving smoking or alcohol  Follow-up outpatient ENT for  progressive hearing loss  50% partial weightbearing left lower extremity until 08/06/2018 and then full  No aspirin products or Goody powders  Discharge medications. 1 Tylenol as needed 2.  Norvasc 10 mg p.o. daily 3.  Lipitor 20 mg p.o. nightly 4.  Colace 200 mg p.o. daily 5.  Neurontin 100 mg p.o. twice daily as needed 6.  Glucotrol 2.5 mg before breakfast 7.  Lantus insulin 18 units nightly 8.  Lisinopril 20 mg p.o. daily 9.  Melatonin 3 mg p.o. nightly 10.  Multivitamin daily 11.  Nitroglycerin as needed 12.  Protonix 40 mg p.o. daily 13.  Carafate 1 g p.o. 3 times daily with meals and bedtime 14.  Flomax 0.4 mg daily  Discharge Instructions    Ambulatory referral to Physical Medicine Rehab   Complete by: As directed    Moderate complexity follow-up 1 to 2 weeks traumatic IVH      Follow-up Information    Kirsteins, Luanna Salk, MD Follow up.   Specialty: Physical Medicine and Rehabilitation Why: Office to call for appointment Contact information: [place] [address] [phone]        Eustace Moore, MD Follow up.   Specialty: Neurosurgery Why: Call for appointment Contact information: [address] [facility] [address] [place] [address] [phone]        Renette Butters, MD Follow up.   Specialty: Orthopedic Surgery Why: Call for appointment Contact information: [address] [place] [address] [phone]           Signed: Cathlyn Parsons 07/30/2018, 5:42 AM

## 2018-07-29 NOTE — Progress Notes (Signed)
[facility] [facility] PHYSICAL MEDICINE & REHABILITATION PROGRESS NOTE   Subjective/Complaints:  Pt oriented to person, place partially to time (Monday, June, )  States he had some diarrhea butn only small cont formed stools recorded)  Feels like he ate something that did not agree  Wants to go home soon   ROS- denies CP, SOB,  N/V/D   Objective:   No results found. No results for input(s): WBC, HGB, HCT, PLT in the last 72 hours. No results for input(s): NA, K, CL, CO2, GLUCOSE, BUN, CREATININE, CALCIUM in the last 72 hours.  Intake/Output Summary (Last 24 hours) at 07/29/2018 0720 Last data filed at 07/29/2018 0301 Gross per 24 hour  Intake 1294 ml  Output 500 ml  Net 794 ml     Physical Exam: Vital Signs Blood pressure 122/63, pulse 73, temperature 97.8 F (36.6 C), temperature source Oral, resp. rate 20, height 5\' 5"  (1.651 m), weight 67.1 kg, SpO2 96 %.   General: No acute distress Mood and affect are appropriate Heart: Regular rate and rhythm no rubs murmurs or extra sounds Lungs: Clear to auscultation, breathing unlabored, no rales or wheezes Abdomen: Positive bowel sounds, soft nontender to palpation, nondistended- unchanged  Extremities: No clubbing, cyanosis, or edema Skin: No evidence of breakdown, no evidence of rash Neurologic: Cranial nerves II through XII intact, motor strength is 4/5 in bilateral deltoid, bicep, tricep, grip, 4/5 RIght and 3+ Left hip flexor, knee extensors, ankle dorsiflexor and plantar flexor Sensory exam normal sensation to light touch and proprioception in bilateral upper and lower extremities  Musculoskeletal: RIght PSIS tendernss   Assessment/Plan: 1. Functional deficits secondary to Traumatic IVH and Left acetabular fracture which require 3+ hours per day of interdisciplinary therapy in a comprehensive inpatient rehab setting.  Physiatrist is providing close team supervision and 24 hour management of active medical problems listed  below.  Physiatrist and rehab team continue to assess barriers to discharge/monitor patient progress toward functional and medical goals  Care Tool:  Bathing    Body parts bathed by patient: Right arm, Left arm, Chest, Abdomen, Front perineal area, Buttocks, Right upper leg, Left upper leg, Face   Body parts bathed by helper: Right lower leg, Left lower leg     Bathing assist Assist Level: Minimal Assistance - Patient > 75%     Upper Body Dressing/Undressing Upper body dressing   What is the patient wearing?: Pull over shirt    Upper body assist Assist Level: Supervision/Verbal cueing    Lower Body Dressing/Undressing Lower body dressing      What is the patient wearing?: Incontinence brief, Pants     Lower body assist Assist for lower body dressing: Minimal Assistance - Patient > 75%     Toileting Toileting    Toileting assist Assist for toileting: Moderate Assistance - Patient 50 - 74% Assistive Device Comment: urinal   Transfers Chair/bed transfer  Transfers assist     Chair/bed transfer assist level: Moderate Assistance - Patient 50 - 74%     Locomotion Ambulation   Ambulation assist      Assist level: Contact Guard/Touching assist Assistive device: Walker-rolling Max distance: 60   Walk 10 feet activity   Assist     Assist level: Contact Guard/Touching assist Assistive device: Walker-rolling   Walk 50 feet activity   Assist Walk 50 feet with 2 turns activity did not occur: Safety/medical concerns  Assist level: Contact Guard/Touching assist Assistive device: Walker-rolling    Walk 150 feet activity   Assist  Walk 150 feet activity did not occur: Safety/medical concerns         Walk 10 feet on uneven surface  activity   Assist Walk 10 feet on uneven surfaces activity did not occur: Safety/medical concerns         Wheelchair     Assist   Type of Wheelchair: Manual    Wheelchair assist level: Supervision/Verbal  cueing Max wheelchair distance: 200    Wheelchair 50 feet with 2 turns activity    Assist        Assist Level: Supervision/Verbal cueing   Wheelchair 150 feet activity     Assist     Assist Level: Supervision/Verbal cueing    Medical Problem List and Plan: 1.Decreased functional mobility with cognitive deficitssecondary to traumatic intraventricular hemorrhage with conservative care as well as left acetabular fracture.Weightbearing advanced to 50% partial weightbearing left lower extremity x2 weeks then progress to full weightbearing on 6/23  Would benefit from family staying with him at night to avoid falls given wt bearing restrictions .CIR PT, OT, SLP 2. Antithrombotics: -DVT/anticoagulation:SCDs -antiplatelet therapy: N/A 3. Pain Management:Neurontin 100 mg twice daily as needed RIght low back pain likely immobility related will order trolamine cream an dKpad 4. Mood:Melatonin 3 mg nightly -antipsychotic agents: N/A 5. Neuropsych: This patientis notcapable of making decisions on hisown behalf. 6. Skin/Wound Care:Stage II pressure injury to coccyx. Bilateral stage I pressure injury to heels. Routine skin checks 7. Fluids/Electrolytes/Nutrition:Routine in and outs with follow-up chemistries 8.Hypertension. Ranexa 500 mg twice daily, lisinopril 20 mg daily, Norvasc 10 mg daily Vitals:   07/28/18 1946 07/29/18 0412  BP: 122/60 122/63  Pulse: 85 73  Resp: 20 20  Temp: 98 F (36.7 C) 97.8 F (36.6 C)  SpO2: 96% 96%   9. History of diabetes mellitus. Hemoglobin A1c 7.9. Lantus insulin 18 units nightly. Check blood sugars before meals and at bedtime CBG (last 3)  Recent Labs    07/28/18 1656 07/28/18 2105 07/29/18 0603  GLUCAP 303* 250* 145*  controlled 6/15 but elevated yesterday pm , restart glucotrol 2.5mg  qam (home dose 10mg  BID)  10. CAD with history of CABG. Aspirin on hold due to intraventricular  hemorrhage. Follow-up cardiology services as needed 11. Hyperlipidemia. Lipitor 12. BPH. Flomax 0.4 mg daily. 13. GERD. Protonix, added carafate on 6/10  14.  Constipation improving t, on dulcolax 15mg  Q72h, colace 200mg  daily Also has prn senna, sorbitol and bisacodyl LOS: 6 days A FACE TO FACE EVALUATION WAS PERFORMED  Charlett Blake 07/29/2018, 7:20 AM

## 2018-07-29 NOTE — Plan of Care (Signed)
  Problem: Consults Goal: RH BRAIN INJURY PATIENT EDUCATION Description: Description: See Patient Education module for eduction specifics Outcome: Progressing   Problem: RH BOWEL ELIMINATION Goal: RH STG MANAGE BOWEL WITH ASSISTANCE Description: STG Manage Bowel with Novice.  Outcome: Progressing   Problem: RH BLADDER ELIMINATION Goal: RH STG MANAGE BLADDER WITH ASSISTANCE Description: STG Manage Bladder With Min Assistance  Outcome: Progressing   Problem: RH SKIN INTEGRITY Goal: RH STG SKIN FREE OF INFECTION/BREAKDOWN Description: No new breakdown with min assist   Outcome: Progressing   Problem: RH COGNITION-NURSING Goal: RH STG USES MEMORY AIDS/STRATEGIES W/ASSIST TO PROBLEM SOLVE Description: STG Uses Memory Aids/Strategies With Min Assistance to Problem Solve.  Outcome: Progressing

## 2018-07-30 ENCOUNTER — Ambulatory Visit (HOSPITAL_COMMUNITY): Payer: PPO | Admitting: Physical Therapy

## 2018-07-30 ENCOUNTER — Inpatient Hospital Stay (HOSPITAL_COMMUNITY): Payer: PPO | Admitting: Speech Pathology

## 2018-07-30 ENCOUNTER — Inpatient Hospital Stay (HOSPITAL_COMMUNITY): Payer: PPO | Admitting: Occupational Therapy

## 2018-07-30 ENCOUNTER — Inpatient Hospital Stay (HOSPITAL_COMMUNITY): Payer: PPO | Admitting: Rehabilitation

## 2018-07-30 ENCOUNTER — Encounter (HOSPITAL_COMMUNITY): Payer: PPO | Admitting: Speech Pathology

## 2018-07-30 LAB — GLUCOSE, CAPILLARY
Glucose-Capillary: 130 mg/dL — ABNORMAL HIGH (ref 70–99)
Glucose-Capillary: 210 mg/dL — ABNORMAL HIGH (ref 70–99)

## 2018-07-30 MED ORDER — AMLODIPINE BESYLATE 10 MG PO TABS: 10.0000 mg | ORAL_TABLET | Freq: Every day | ORAL | 1 refills | Status: DC

## 2018-07-30 MED ORDER — ESOMEPRAZOLE MAGNESIUM 40 MG PO CPDR: 40.0000 mg | DELAYED_RELEASE_CAPSULE | Freq: Two times a day (BID) | ORAL | 0 refills | Status: DC

## 2018-07-30 MED ORDER — LISINOPRIL 20 MG PO TABS: 20.0000 mg | ORAL_TABLET | Freq: Every day | ORAL | 0 refills | Status: DC

## 2018-07-30 MED ORDER — GLIPIZIDE 5 MG PO TABS: 2.5000 mg | ORAL_TABLET | Freq: Two times a day (BID) | ORAL | Status: DC

## 2018-07-30 MED ORDER — TAMSULOSIN HCL 0.4 MG PO CAPS: 0.4000 mg | ORAL_CAPSULE | Freq: Every day | ORAL | 0 refills | Status: AC

## 2018-07-30 MED ORDER — SUCRALFATE 1 GM/10ML PO SUSP: 1.0000 g | Freq: Four times a day (QID) | ORAL | 0 refills | Status: DC

## 2018-07-30 MED ORDER — MELATONIN 3 MG PO TABS: 3.0000 mg | ORAL_TABLET | Freq: Every day | ORAL | 0 refills | Status: DC

## 2018-07-30 MED ORDER — INSULIN GLARGINE 100 UNIT/ML SOLOSTAR PEN: 18.0000 [IU] | PEN_INJECTOR | Freq: Every day | SUBCUTANEOUS | 11 refills | Status: DC

## 2018-07-30 MED ORDER — ATORVASTATIN CALCIUM 20 MG PO TABS: 20.0000 mg | ORAL_TABLET | Freq: Every day | ORAL | 1 refills | Status: DC

## 2018-07-30 MED ORDER — GLIPIZIDE 5 MG PO TABS: 2.5000 mg | ORAL_TABLET | Freq: Two times a day (BID) | ORAL | 0 refills | Status: DC

## 2018-07-30 NOTE — Progress Notes (Signed)
Speech Language Pathology Discharge Summary  Patient Details  Name: Ricardo Le MRN: [id number] Date of Birth: 07-07-1940  Today's Date: 07/30/2018 SLP Individual Time: 4665-9935 SLP Individual Time Calculation (min): 30 min   Skilled Therapeutic Interventions:  Skilled treatment session focused on completing education with pt's daughter. Pt and his family have made choice for pt to discharge home today because they understand how to provide foods that don't upset his stomach, his wife administers laxative according to self-created scheduled. Education provided to pt and his daughter on safety awareness and 24 hour supervision and need for help with all ADLs. She states that he is very "babyed" at home.      Patient has met (Pt didn't complete rehab course, left by choice) of 5 long term goals.  Patient to discharge at overall Mod level.    Clinical Impression/Discharge Summary:   Pt has not met any LTGs d/t to choice to discharge home before course of recommended rehab was finished. All education has been completed and recommend HHST to target any deficits within home setting.   Care Partner:  Caregiver Able to Provide Assistance: Yes  Type of Caregiver Assistance: Physical;Cognitive  Recommendation:  24 hour supervision/assistance;Home Health SLP  Rationale for SLP Follow Up: Maximize functional communication;Maximize cognitive function and independence;Reduce caregiver burden   Equipment:   N/A  Reasons for discharge: Other (comment)(pt wanted to go home)   Patient/Family Agrees with Progress Made and Goals Achieved: Yes    Rayvn Rickerson 07/30/2018, 2:19 PM

## 2018-07-30 NOTE — Progress Notes (Signed)
Physical Therapy Session Note  Patient Details  Name: Ricardo Le MRN: [id number] Date of Birth: Apr 27, 1940  Today's Date: 07/30/2018 PT Individual Time: 1225-1240 PT Individual Time Calculation (min): 15 min   Short Term Goals: Week 1:  PT Short Term Goal 1 (Week 1): Pt will consistently perform bed<>WC transfers with min assist  PT Short Term Goal 2 (Week 1): Pt will ambulate >44ft with min assist and LRAD  PT Short Term Goal 3 (Week 1): Pt will perform all bed mobility with supervision assist.   Skilled Therapeutic Interventions/Progress Updates: Pt presented in w/c with dgt Ricardo Le present. Due to change in d/c plans session primarily to demonstrate curb training and transfers. Pt transported to rehab gym and performed curb step with RW and CGA. Pt performed STS from w/c with CGA and safe management of RW. Pt ambulated short distance to step. Pt transported back to room and discussed with dgt assessment of bed mobility as performed yesterday and maintaining safe space at home (removing rugs etc) as considered still a fall risk. Dgt verbalized understanding. Pt left in w/c with dgt and advised nsg of status.       Therapy Documentation Precautions:  Precautions Precautions: Fall Restrictions Weight Bearing Restrictions: Yes LLE Weight Bearing: Partial weight bearing LLE Partial Weight Bearing Percentage or Pounds: until  6/24 and then WBAT   Therapy/Group: Individual Therapy  Ricardo Le  Ricardo Le, PTA  07/30/2018, 12:46 PM

## 2018-07-30 NOTE — Progress Notes (Addendum)
[facility] PHYSICAL MEDICINE & REHABILITATION PROGRESS NOTE   Subjective/Complaints:    Hearing aides not in  Discussed d/c date in am pt pleased    ROS- denies CP, SOB,  N/V/D   Objective:   No results found. No results for input(s): WBC, HGB, HCT, PLT in the last 72 hours. No results for input(s): NA, K, CL, CO2, GLUCOSE, BUN, CREATININE, CALCIUM in the last 72 hours.  Intake/Output Summary (Last 24 hours) at 07/30/2018 0732 Last data filed at 07/30/2018 0727 Gross per 24 hour  Intake 360 ml  Output 475 ml  Net -115 ml     Physical Exam: Vital Signs Blood pressure (!) 144/70, pulse 75, temperature 97.9 F (36.6 C), temperature source Oral, resp. rate 18, height 5\' 5"  (1.651 m), weight 67.1 kg, SpO2 96 %.   General: No acute distress Mood and affect are appropriate Heart: Regular rate and rhythm no rubs murmurs or extra sounds Lungs: Clear to auscultation, breathing unlabored, no rales or wheezes Abdomen: Positive bowel sounds, soft nontender to palpation, nondistended- unchanged  Extremities: No clubbing, cyanosis, or edema Skin: No evidence of breakdown, no evidence of rash Neurologic: Cranial nerves II through XII intact, motor strength is 4/5 in bilateral deltoid, bicep, tricep, grip, 4/5 RIght and 3+ Left hip flexor, knee extensors, ankle dorsiflexor and plantar flexor Sensory exam normal sensation to light touch and proprioception in bilateral upper and lower extremities  Musculoskeletal: RIght PSIS tendernss   Assessment/Plan: 1. Functional deficits secondary to Traumatic IVH and Left acetabular fracture Stable for D/C today F/u PCP in 3-4 weeks F/u PM&R 2 weeks See D/C summary See D/C instructions Care Tool:  Bathing    Body parts bathed by patient: Right arm, Left arm, Chest, Abdomen, Front perineal area, Buttocks, Right upper leg, Left upper leg, Face   Body parts bathed by helper: Right lower leg, Left lower leg     Bathing assist Assist Level:  Minimal Assistance - Patient > 75%     Upper Body Dressing/Undressing Upper body dressing   What is the patient wearing?: Pull over shirt    Upper body assist Assist Level: Supervision/Verbal cueing    Lower Body Dressing/Undressing Lower body dressing      What is the patient wearing?: Incontinence brief, Pants     Lower body assist Assist for lower body dressing: Minimal Assistance - Patient > 75%     Toileting Toileting    Toileting assist Assist for toileting: Moderate Assistance - Patient 50 - 74% Assistive Device Comment: urinal   Transfers Chair/bed transfer  Transfers assist     Chair/bed transfer assist level: Moderate Assistance - Patient 50 - 74%     Locomotion Ambulation   Ambulation assist      Assist level: Contact Guard/Touching assist Assistive device: Walker-rolling Max distance: 55ft   Walk 10 feet activity   Assist     Assist level: Contact Guard/Touching assist Assistive device: Walker-rolling   Walk 50 feet activity   Assist Walk 50 feet with 2 turns activity did not occur: Safety/medical concerns  Assist level: Contact Guard/Touching assist Assistive device: Walker-rolling    Walk 150 feet activity   Assist Walk 150 feet activity did not occur: Safety/medical concerns         Walk 10 feet on uneven surface  activity   Assist Walk 10 feet on uneven surfaces activity did not occur: Safety/medical concerns   Assist level: Contact Guard/Touching assist Assistive device: Chemical engineer  Assist Will patient use wheelchair at discharge?: Yes Type of Wheelchair: Manual    Wheelchair assist level: Supervision/Verbal cueing Max wheelchair distance: 162ft    Wheelchair 50 feet with 2 turns activity    Assist        Assist Level: Supervision/Verbal cueing   Wheelchair 150 feet activity     Assist     Assist Level: Supervision/Verbal cueing    Medical Problem List and  Plan: 1.Decreased functional mobility with cognitive deficitssecondary to traumatic intraventricular hemorrhage with conservative care as well as left acetabular fracture.Weightbearing advanced to 50% partial weightbearing left lower extremity x2 weeks then progress to full weightbearing on 6/23  Medically stable for d/c  .CIR PT, OT, SLP 2. Antithrombotics: -DVT/anticoagulation:SCDs -antiplatelet therapy: N/A 3. Pain Management:Neurontin 100 mg twice daily as needed RIght low back pain likely immobility related will order trolamine cream an dKpad 4. Mood:Melatonin 3 mg nightly -antipsychotic agents: N/A 5. Neuropsych: This patientis notcapable of making decisions on hisown behalf. 6. Skin/Wound Care:Stage II pressure injury to coccyx. Bilateral stage I pressure injury to heels. Routine skin checks 7. Fluids/Electrolytes/Nutrition:Routine in and outs with follow-up chemistries 8.Hypertension. Ranexa 500 mg twice daily, lisinopril 20 mg daily, Norvasc 10 mg daily Vitals:   07/29/18 1414 07/29/18 1941  BP: 115/66 (!) 144/70  Pulse: 88 75  Resp: 20 18  Temp: 97.7 F (36.5 C) 97.9 F (36.6 C)  SpO2: 99% 96%   9. History of diabetes mellitus. Hemoglobin A1c 7.9. Lantus insulin 18 units nightly. Check blood sugars before meals and at bedtime CBG (last 3)  Recent Labs    07/29/18 1641 07/29/18 2113 07/30/18 0633  GLUCAP 86 238* 130*  controlled 6/15 but elevated yesterday pm , restart glucotrol 2.5mg  BID (home dose 10mg  BID)  10. CAD with history of CABG. Aspirin on hold due to intraventricular hemorrhage. Follow-up cardiology services as needed 11. Hyperlipidemia. Lipitor 12. BPH. Flomax 0.4 mg daily. 13. GERD. Protonix, added carafate on 6/10  14.  Constipation improving t, on dulcolax 15mg  Q72h, colace 200mg  daily Also has prn senna, sorbitol and bisacodyl LOS: 7 days A FACE TO FACE EVALUATION WAS PERFORMED  Ricardo Le 07/30/2018, 7:32 AM

## 2018-07-30 NOTE — Progress Notes (Signed)
Occupational Therapy Discharge Summary  Patient Details  Name: Ricardo Le MRN: [id number] Date of Birth: June 08, 1940  Today's Date: 07/30/2018 OT Individual Time:  1000-1040 40 minutes of skilled OT intervention  Patient has met 2 of 12 long term goals due to improved activity tolerance, improved balance and postural control.  Patient to discharge at [facility] Assist level.  Patient's care partner is independent to provide the necessary physical and cognitive assistance at discharge. His daughter attended hands on family education on 6/16.   Reasons goals not met: Family requesting pt to discharge from hospital and return home after hands on family education. Pt's plan of care was for him to stay in inpatient rehab for 14 days in order to reach supervision level.  Recommendation:  Patient will benefit from ongoing skilled OT services in home health setting to continue to advance functional skills in the area of BADL and Reduce care partner burden.  Equipment: No equipment provided  Reasons for discharge: discharge from hospital and family request  Patient/family agrees with progress made and goals achieved: Yes   OT Intervention: Pt seated in wheelchair awaiting OT arrival. Pt's daughter, Ricardo Le, present for hands on family education. OT educated caregiver on use of RW with cuing as needed for safety and balance with pt at overall min A level. Caregiver reports pt will not be showering initially at home and declined shower equipment. Caregiver demonstrated ability to perform transfers, toileting, and assist pt as needed this session. OT continued to encourage caregiver to allow pt to do as much as possible for himself to increase independence. Caregiver with no further concerns at this time.   OT Discharge Precautions/Restrictions  Precautions Precautions: Fall Restrictions Weight Bearing Restrictions: Yes LLE Weight Bearing: Partial weight bearing LLE Partial Weight Bearing  Percentage or Pounds: until  6/24 and then WBAT General   Vital Signs  Pain Pain Assessment Pain Scale: 0-10 Pain Score: 0-No pain ADL   Vision Baseline Vision/History: Wears glasses Patient Visual Report: No change from baseline Perception    Praxis   Cognition Overall Cognitive Status: Impaired/Different from baseline Arousal/Alertness: Awake/alert Orientation Level: Oriented to person;Oriented to place;Oriented to time;Disoriented to situation Attention: Sustained Sustained Attention: Impaired Sustained Attention Impairment: Verbal complex;Functional complex Awareness: Impaired Awareness Impairment: Intellectual impairment Problem Solving: Impaired Problem Solving Impairment: Verbal basic;Functional basic Behaviors: Restless;Impulsive Safety/Judgment: Impaired Sensation Sensation Light Touch: Appears Intact Hot/Cold: Appears Intact Proprioception: Not tested Stereognosis: Not tested Motor  Motor Motor - Discharge Observations: generalized weakness but improved from eval Mobility  Bed Mobility Supine to Sit: Supervision/Verbal cueing Sit to Supine: Supervision/Verbal cueing Transfers Sit to Stand: Contact Guard/Touching assist Stand to Sit: Contact Guard/Touching assist  Trunk/Postural Assessment  Cervical Assessment Cervical Assessment: Within Functional Limits Thoracic Assessment Thoracic Assessment: Exceptions to [facility] Lumbar Assessment Lumbar Assessment: Within Functional Limits Postural Control Postural Control: Within Functional Limits  Balance Balance Balance Assessed: Yes Dynamic Sitting Balance Dynamic Sitting - Balance Support: During functional activity Dynamic Sitting - Level of Assistance: 5: Stand by assistance Dynamic Sitting - Balance Activities: Reaching for objects;Forward lean/weight shifting Dynamic Standing Balance Dynamic Standing - Balance Support: During functional activity Dynamic Standing - Level of Assistance: 4: Min  assist;5: Stand by assistance Extremity/Trunk Assessment RUE Assessment RUE Assessment: Within Functional Limits LUE Assessment LUE Assessment: Within Functional Limits   Gypsy Decant 07/30/2018, 5:27 PM

## 2018-07-30 NOTE — Progress Notes (Signed)
Physical Therapy Session Note  Patient Details  Name: Ricardo Le MRN: [id number] Date of Birth: 1940/08/15  Today's Date: 07/30/2018 PT Individual Time: 0900-0930 PT Individual Time Calculation (min): 30 min   Short Term Goals: Week 1:  PT Short Term Goal 1 (Week 1): Pt will consistently perform bed<>WC transfers with min assist  PT Short Term Goal 2 (Week 1): Pt will ambulate >66ft with min assist and LRAD  PT Short Term Goal 3 (Week 1): Pt will perform all bed mobility with supervision assist.   Skilled Therapeutic Interventions/Progress Updates:   Pt received sitting in w/c in room, agreeable to therapy.  PT assisted to therapy gym due to time constraint.  Once in therapy gym, pt able to ambulate x 50' making 2 turns at min/guard A level with continued cues for upright posture and forward gaze.  PT also cued for improved stride length and distance to walker as he tends to let RW get too far ahead.  Once back in therapy gym, pt able to stand x 4-5 mins for bean bag toss.  Emphasis on weight shifting and balance with single UE support.  Pt mildly unsteady, needing min/guard to min A to correct postural sway.  Pt then reporting stomach hurting and needing to use restroom.  Pt willing to ambulate part of the way back to room.  Pt ambulated another 65' back towards room, but was unable to continue due to stomach pain.  Once back in room, assisted with RW into restroom at S level.  Pt left on toilet, nurse tech aware and pt able to verbalize that he needed to use pull string in restroom when ready.  RN also made aware of stomach pain and diarrhea.    Therapy Documentation Precautions:  Precautions Precautions: Fall Restrictions Weight Bearing Restrictions: Yes LLE Weight Bearing: Partial weight bearing LLE Partial Weight Bearing Percentage or Pounds: until  6/24 and then WBAT   Pain: Reports stomach pain at end of session, RN notified.    Therapy/Group: Individual Therapy  Denice Bors 07/30/2018, 9:28 AM

## 2018-07-30 NOTE — Progress Notes (Signed)
Patient and daughter received discharge instructions from Marlowe Shores, PA-C with verbal understanding. Patient discharged to home with daughter and patient belongings. All questions answered to patient's daughter understanding.

## 2018-07-30 NOTE — Plan of Care (Signed)
Pt only met 2/12 goals secondary to family requesting to take pt home before the 14 recommended days of inpatient rehab intervention

## 2018-07-30 NOTE — Progress Notes (Signed)
Physical Therapy Discharge Summary  Patient Details  Name: Ricardo Le MRN: [id number] Date of Birth: March 03, 1940  Today's Date: 07/30/2018  Patient has met 5 of 11 long term goals due to improved activity tolerance, improved balance, increased strength, decreased pain, improved attention, improved awareness and improved coordination.  Patient to discharge at an ambulatory level Contact Guard to close supervision as varied per fatigue level .   Patient's care partner is independent to provide the necessary physical assistance at discharge.  Reasons goals not met: Pt's family requesting pt to return home prior to anticipated d/c date. Pt was progressing towards goals and had been making significant improvements in past several days however pt was still currently at [facility] with all functional mobility. Family ed provided day of d/c and pt's daughter demonstrated and verbalized understanding of current needs and that pt not currently at supervision level.   Recommendation:  Patient will benefit from ongoing skilled PT services in home health setting to continue to advance safe functional mobility, address ongoing impairments in strength, balance, endurance, and minimize fall risk.  Equipment: No equipment provided  Reasons for discharge: discharge from hospital  Patient/family agrees with progress made and goals achieved: Yes  PT Discharge Precautions/Restrictions Precautions Precautions: Fall Restrictions Weight Bearing Restrictions: Yes LLE Weight Bearing: Partial weight bearing LLE Partial Weight Bearing Percentage or Pounds: until  6/24 and then WBAT Vital Signs  Pain Pain Assessment Pain Scale: 0-10 Pain Score: 0-No pain Vision/Perception     Cognition Arousal/Alertness: Awake/alert Orientation Level: Oriented to person;Oriented to place;Oriented to time;Disoriented to situation Physiological scientist - Discharge Observations: generalized weakness but improved  from eval  Mobility Bed Mobility Supine to Sit: Supervision/Verbal cueing Sit to Supine: Supervision/Verbal cueing Transfers Transfers: Stand to Sit;Sit to Stand Sit to Stand: Contact Guard/Touching assist Stand to Sit: Contact Guard/Touching assist Transfer (Assistive device): Rolling walker Locomotion  Gait Ambulation: Yes Gait Assistance: Contact Guard/Touching assist Gait Distance (Feet): 60 Feet Assistive device: Rolling walker Gait Gait: Yes Gait Pattern: Impaired Gait Pattern: Antalgic Gait velocity: decreased Stairs / Additional Locomotion Stairs: Yes Stairs Assistance: Contact Guard/Touching assist Stair Management Technique: With walker Number of Stairs: 1 Height of Stairs: 6 Curb: Nurse, mental health Mobility: Yes Wheelchair Assistance: Chartered loss adjuster: Both upper extremities Wheelchair Parts Management: Needs assistance Distance: 150  Trunk/Postural Assessment  Cervical Assessment Cervical Assessment: Within Functional Limits Thoracic Assessment Thoracic Assessment: Exceptions to [facility] Lumbar Assessment Lumbar Assessment: Within Functional Limits Postural Control Postural Control: Within Functional Limits  Balance Balance Balance Assessed: Yes Dynamic Sitting Balance Dynamic Sitting - Balance Support: During functional activity Dynamic Sitting - Level of Assistance: 6: Modified independent (Device/Increase time) Extremity Assessment      RLE Assessment RLE Assessment: Within Functional Limits LLE Assessment LLE Assessment: Exceptions to [facility] Strength Comments: grossly 4+/5    Rosita DeChalus 07/30/2018, 1:00 PM   Primary PT filled out impairments listed above that need to be addressed with HHPT.

## 2018-07-31 NOTE — Progress Notes (Signed)
Social Work Discharge Note  The overall goal for the admission was met for:   Discharge location: Yes - home with family  Length of Stay: Yes - 7 days  Discharge activity level: Yes - minimal assistance  Home/community participation: Yes  Services provided included: MD, RD, PT, OT, SLP, RN, Pharmacy and SW  Financial Services: Private Insurance: [employer]  Follow-up services arranged: Home Health: PT/OT, DME: Pt has all recommended DME at home already. and Patient/Family request agency HH: [facility], DME: N/A  Comments (or additional information): Dtr came for family education and she feels family can manage pt at home at his current level and that he will do better at home in his own environment.    Patient/Family verbalized understanding of follow-up arrangements: Yes  Individual responsible for copt of the follow-up plan: pt's wife, Jamal Pavon [phone] (h); [phone] (m) and dtr, Etter Sjogren and son Hassani Sliney  Confirmed correct DME delivered: Trey Sailors 07/31/2018    Peggy Monk, [name] Mesi

## 2018-07-31 NOTE — Discharge Instructions (Signed)
°  Inpatient Rehab Discharge Instructions  [place] Discharge date and time: No discharge date for patient encounter.   Activities/Precautions/ Functional Status: Activity: Partial weightbearing left lower extremity advancing to weightbearing as tolerated Diet: diabetic diet Wound Care: keep wound clean and dry Functional status:  ___ No restrictions     ___ Walk up steps independently ___ 24/7 supervision/assistance   ___ Walk up steps with assistance ___ Intermittent supervision/assistance  ___ Bathe/dress independently ___ Walk with walker     _x__ Bathe/dress with assistance ___ Walk Independently    ___ Shower independently ___ Walk with assistance    ___ Shower with assistance ___ No alcohol     ___ Return to work/school ________  COMMUNITY REFERRALS UPON DISCHARGE:   Home Health:   PT     OT  Agency:  [facility] Phone:  [phone]  Medical Equipment/Items Ordered:  You have all recommended equipment at home already.   Special Instructions:  No smoking or alcohol  My questions have been answered and I understand these instructions. I will adhere to these goals and the provided educational materials after my discharge from the hospital.  Patient/Caregiver Signature _______________________________ Date __________  Clinician Signature _______________________________________ Date __________  Please bring this form and your medication list with you to all your follow-up doctor's appointments.

## 2018-08-01 ENCOUNTER — Telehealth: Payer: Self-pay | Admitting: Registered Nurse

## 2018-08-01 NOTE — Telephone Encounter (Signed)
Transitional Care call Transitional Questions Answered by [name]   Patient name: Ricardo Le DOB: 07-04-1940 1. Are you/is patient experiencing any problems since coming home? No a. Are there any questions regarding any aspect of care? No 2. Are there any questions regarding medications administration/dosing? No a. Are meds being taken as prescribed? Yes b. "Patient should review meds with caller to confirm" Medication List Reviewed 3. Have there been any falls? No 4. Has Home Health been to the house and/or have they contacted you? Yes, [facility] a. If not, have you tried to contact them? NA b. Can we help you contact them? NA 5. Are bowels and bladder emptying properly? Yes a. Are there any unexpected incontinence issues? No b. If applicable, is patient following bowel/bladder programs? NA 6. Any fevers, problems with breathing, unexpected pain? No 7. Are there any skin problems or new areas of breakdown? No 8. Has the patient/family member arranged specialty MD follow up (ie cardiology/neurology/renal/surgical/etc.)?  [name] will call Dr. Ronnald Ramp office to schedule HFU appointment a. Can we help arrange? No 9. Does the patient need any other services or support that we can help arrange? No 10. Are caregivers following through as expected in assisting the patient? Yes 11. Has the patient quit smoking, drinking alcohol, or using drugs as recommended? [name] reports [name] doesn't smoke, drink alcohol or use illicit drugs.   Appointment date/time : Telephone Call: 08/08/2018 for 1:40 appointment. With Bayard Hugger ANP-C. At [place]

## 2018-08-05 DIAGNOSIS — R339 Retention of urine, unspecified: Secondary | ICD-10-CM | POA: Diagnosis not present

## 2018-08-05 DIAGNOSIS — B9689 Other specified bacterial agents as the cause of diseases classified elsewhere: Secondary | ICD-10-CM | POA: Diagnosis not present

## 2018-08-05 DIAGNOSIS — N200 Calculus of kidney: Secondary | ICD-10-CM | POA: Diagnosis not present

## 2018-08-05 DIAGNOSIS — N390 Urinary tract infection, site not specified: Secondary | ICD-10-CM | POA: Diagnosis not present

## 2018-08-05 DIAGNOSIS — N400 Enlarged prostate without lower urinary tract symptoms: Secondary | ICD-10-CM | POA: Diagnosis not present

## 2018-08-05 DIAGNOSIS — R319 Hematuria, unspecified: Secondary | ICD-10-CM | POA: Diagnosis not present

## 2018-08-07 DIAGNOSIS — K5909 Other constipation: Secondary | ICD-10-CM | POA: Diagnosis not present

## 2018-08-07 DIAGNOSIS — N401 Enlarged prostate with lower urinary tract symptoms: Secondary | ICD-10-CM | POA: Diagnosis not present

## 2018-08-07 DIAGNOSIS — R339 Retention of urine, unspecified: Secondary | ICD-10-CM | POA: Diagnosis not present

## 2018-08-07 DIAGNOSIS — Z79899 Other long term (current) drug therapy: Secondary | ICD-10-CM | POA: Diagnosis not present

## 2018-08-08 ENCOUNTER — Encounter: Payer: PPO | Admitting: Registered Nurse

## 2018-08-09 DIAGNOSIS — Z978 Presence of other specified devices: Secondary | ICD-10-CM | POA: Diagnosis not present

## 2018-08-09 DIAGNOSIS — R339 Retention of urine, unspecified: Secondary | ICD-10-CM | POA: Diagnosis not present

## 2018-08-12 DIAGNOSIS — Z951 Presence of aortocoronary bypass graft: Secondary | ICD-10-CM | POA: Diagnosis not present

## 2018-08-12 DIAGNOSIS — M5126 Other intervertebral disc displacement, lumbar region: Secondary | ICD-10-CM | POA: Diagnosis not present

## 2018-08-12 DIAGNOSIS — E785 Hyperlipidemia, unspecified: Secondary | ICD-10-CM | POA: Diagnosis not present

## 2018-08-12 DIAGNOSIS — M519 Unspecified thoracic, thoracolumbar and lumbosacral intervertebral disc disorder: Secondary | ICD-10-CM | POA: Diagnosis not present

## 2018-08-12 DIAGNOSIS — Z794 Long term (current) use of insulin: Secondary | ICD-10-CM | POA: Diagnosis not present

## 2018-08-12 DIAGNOSIS — I25110 Atherosclerotic heart disease of native coronary artery with unstable angina pectoris: Secondary | ICD-10-CM | POA: Diagnosis not present

## 2018-08-12 DIAGNOSIS — S06369D Traumatic hemorrhage of cerebrum, unspecified, with loss of consciousness of unspecified duration, subsequent encounter: Secondary | ICD-10-CM | POA: Diagnosis not present

## 2018-08-12 DIAGNOSIS — K5900 Constipation, unspecified: Secondary | ICD-10-CM | POA: Diagnosis not present

## 2018-08-12 DIAGNOSIS — H9190 Unspecified hearing loss, unspecified ear: Secondary | ICD-10-CM | POA: Diagnosis not present

## 2018-08-12 DIAGNOSIS — M549 Dorsalgia, unspecified: Secondary | ICD-10-CM | POA: Diagnosis not present

## 2018-08-12 DIAGNOSIS — M47812 Spondylosis without myelopathy or radiculopathy, cervical region: Secondary | ICD-10-CM | POA: Diagnosis not present

## 2018-08-12 DIAGNOSIS — S32402D Unspecified fracture of left acetabulum, subsequent encounter for fracture with routine healing: Secondary | ICD-10-CM | POA: Diagnosis not present

## 2018-08-12 DIAGNOSIS — I10 Essential (primary) hypertension: Secondary | ICD-10-CM | POA: Diagnosis not present

## 2018-08-12 DIAGNOSIS — Z9181 History of falling: Secondary | ICD-10-CM | POA: Diagnosis not present

## 2018-08-12 DIAGNOSIS — R002 Palpitations: Secondary | ICD-10-CM | POA: Diagnosis not present

## 2018-08-12 DIAGNOSIS — E1151 Type 2 diabetes mellitus with diabetic peripheral angiopathy without gangrene: Secondary | ICD-10-CM | POA: Diagnosis not present

## 2018-08-12 DIAGNOSIS — K8689 Other specified diseases of pancreas: Secondary | ICD-10-CM | POA: Diagnosis not present

## 2018-08-14 DIAGNOSIS — S32402D Unspecified fracture of left acetabulum, subsequent encounter for fracture with routine healing: Secondary | ICD-10-CM | POA: Diagnosis not present

## 2018-08-19 ENCOUNTER — Institutional Professional Consult (permissible substitution): Payer: PPO | Admitting: Internal Medicine

## 2018-08-21 DIAGNOSIS — M47816 Spondylosis without myelopathy or radiculopathy, lumbar region: Secondary | ICD-10-CM | POA: Diagnosis not present

## 2018-08-27 DIAGNOSIS — N401 Enlarged prostate with lower urinary tract symptoms: Secondary | ICD-10-CM | POA: Diagnosis not present

## 2018-08-27 DIAGNOSIS — R339 Retention of urine, unspecified: Secondary | ICD-10-CM | POA: Diagnosis not present

## 2018-08-27 DIAGNOSIS — N390 Urinary tract infection, site not specified: Secondary | ICD-10-CM | POA: Diagnosis not present

## 2018-08-29 DIAGNOSIS — D510 Vitamin B12 deficiency anemia due to intrinsic factor deficiency: Secondary | ICD-10-CM | POA: Diagnosis not present

## 2018-08-29 DIAGNOSIS — M1990 Unspecified osteoarthritis, unspecified site: Secondary | ICD-10-CM | POA: Diagnosis not present

## 2018-08-29 DIAGNOSIS — R630 Anorexia: Secondary | ICD-10-CM | POA: Diagnosis not present

## 2018-08-29 DIAGNOSIS — I259 Chronic ischemic heart disease, unspecified: Secondary | ICD-10-CM | POA: Diagnosis not present

## 2018-08-29 DIAGNOSIS — I10 Essential (primary) hypertension: Secondary | ICD-10-CM | POA: Diagnosis not present

## 2018-08-29 DIAGNOSIS — Z6822 Body mass index (BMI) 22.0-22.9, adult: Secondary | ICD-10-CM | POA: Diagnosis not present

## 2018-09-03 DIAGNOSIS — E1165 Type 2 diabetes mellitus with hyperglycemia: Secondary | ICD-10-CM | POA: Diagnosis not present

## 2018-09-03 DIAGNOSIS — Z79899 Other long term (current) drug therapy: Secondary | ICD-10-CM | POA: Diagnosis not present

## 2018-09-03 DIAGNOSIS — E785 Hyperlipidemia, unspecified: Secondary | ICD-10-CM | POA: Diagnosis not present

## 2018-09-10 DIAGNOSIS — Z6823 Body mass index (BMI) 23.0-23.9, adult: Secondary | ICD-10-CM | POA: Diagnosis not present

## 2018-09-10 DIAGNOSIS — R300 Dysuria: Secondary | ICD-10-CM | POA: Diagnosis not present

## 2018-09-20 ENCOUNTER — Encounter (INDEPENDENT_AMBULATORY_CARE_PROVIDER_SITE_OTHER): Payer: PPO | Admitting: Ophthalmology

## 2018-09-30 ENCOUNTER — Telehealth: Payer: Self-pay | Admitting: Cardiovascular Disease

## 2018-09-30 DIAGNOSIS — N390 Urinary tract infection, site not specified: Secondary | ICD-10-CM | POA: Diagnosis not present

## 2018-09-30 DIAGNOSIS — N401 Enlarged prostate with lower urinary tract symptoms: Secondary | ICD-10-CM | POA: Diagnosis not present

## 2018-09-30 DIAGNOSIS — R339 Retention of urine, unspecified: Secondary | ICD-10-CM | POA: Diagnosis not present

## 2018-09-30 DIAGNOSIS — Z125 Encounter for screening for malignant neoplasm of prostate: Secondary | ICD-10-CM | POA: Diagnosis not present

## 2018-09-30 NOTE — Telephone Encounter (Signed)
Call placed to the patient's wife. She stated that the patient has memory and hearing problems so will need to have someone accompany him to the appointment tomorrow.       COVID-19 Pre-Screening Questions:  . In the past 7 to 10 days have you had a cough,  shortness of breath, headache, congestion, fever (100 or greater) body aches, chills, sore throat, or sudden loss of taste or sense of smell? No . Have you been around anyone with known Covid 19. No . Have you been around anyone who is awaiting Covid 19 test results in the past 7 to 10 days? No . Have you been around anyone who has been exposed to Covid 19, or has mentioned symptoms of Covid 19 within the past 7 to 10 days? No  If you have any concerns/questions about symptoms patients report during screening (either on the phone or at threshold). Contact the provider seeing the patient or DOD for further guidance.  If neither are available contact a member of the leadership team.

## 2018-09-30 NOTE — Telephone Encounter (Signed)
New Message     Patient is hard of hearing and needs someone to come to appointment with him either his son or daughter would accompany him.  Please call patient's wife to discuss.

## 2018-10-01 ENCOUNTER — Encounter: Payer: Self-pay | Admitting: Cardiovascular Disease

## 2018-10-01 ENCOUNTER — Other Ambulatory Visit: Payer: Self-pay

## 2018-10-01 ENCOUNTER — Ambulatory Visit (INDEPENDENT_AMBULATORY_CARE_PROVIDER_SITE_OTHER): Payer: PPO | Admitting: Cardiovascular Disease

## 2018-10-01 VITALS — BP 142/50 | HR 67 | Temp 98.1°F | Ht 65.0 in | Wt 149.0 lb

## 2018-10-01 DIAGNOSIS — E785 Hyperlipidemia, unspecified: Secondary | ICD-10-CM | POA: Diagnosis not present

## 2018-10-01 DIAGNOSIS — I739 Peripheral vascular disease, unspecified: Secondary | ICD-10-CM | POA: Diagnosis not present

## 2018-10-01 DIAGNOSIS — I2510 Atherosclerotic heart disease of native coronary artery without angina pectoris: Secondary | ICD-10-CM

## 2018-10-01 DIAGNOSIS — I10 Essential (primary) hypertension: Secondary | ICD-10-CM | POA: Diagnosis not present

## 2018-10-01 DIAGNOSIS — I779 Disorder of arteries and arterioles, unspecified: Secondary | ICD-10-CM

## 2018-10-01 MED ORDER — ISOSORBIDE MONONITRATE ER 30 MG PO TB24: 30.0000 mg | ORAL_TABLET | Freq: Every day | ORAL | 3 refills | Status: DC

## 2018-10-01 NOTE — Progress Notes (Signed)
Cardiology Office Note   Date:  10/15/2018   ID:  Ricardo Le, DOB 26-Jul-1940, MRN [id number]  PCP:  Angelina Sheriff, MD  Cardiologist:  Dr. Johnsie Cancel  No chief complaint on file.     History of Present Illness: Ricardo Le is a 78 y.o. male who presents for a follow up visit regarding CAD/ PAD. He has known history of coronary artery disease status post coronary artery bypass grafting in 2009 .  Cardiac cath in 2017, 2019 and 2020 showed patent grafts with diffuse distal disease. He has angina but feels worse with Ranexa  Other past medical history include remote history of pancreatic cancer which was treated successfully. He is status post left common iliac artery stent placement in March 2016. He is known to have bilateral SFA occlusion with 1 vessel runoff below the knee bilaterally.  Most recent vascular studies in October 2019 showed patent left common iliac artery stent.  ABI was 0.7 on the right and 0.84 on the left.  Reviewed last cath done 05/28/18 patent SVG to RCA, patent SVG sequential OM's and patent lima to LAD with diffuse distal dx to be Rx medically EF normal   Jul 11 2018 d/c after fall with acetabular fracture More falls and admitted July 20 2018 intraventricular hemorrhage and subarachnoid with some hearing loss.   Imdur not helping him thinks it gives him chest pain. Seeing Dr Melvyn Novas tomorrow seems to have some restrictive lung disease and cannot move Incentive Spirometer very much      Past Medical History:  Diagnosis Date  . CAD (coronary artery disease)    a. 04/2007 s/p CABG x 4  - LIMA->LAD, VG->OM1->Om2, VG->PDA;  b. 12/2008 & 06/2010 Caths - Native multivessel dzs with 4/4 patent grafts. c. cath 03/09/2015 patent grafts, unchanged from prior cath.  . Cancer [facility])    pancreatic  March 2011  . Carotid bruit    a. 07/2010 U/S- 0-39% bilat ICA stenosis  . Chest pain    Noncardiac probably related to reflux  . DDD (degenerative disc disease)    several surgeries  . Fracture acetabulum-closed ([place]) 07/08/2018  . GERD (gastroesophageal reflux disease)   . HNP (herniated nucleus pulposus), lumbar    a. L2-3, s/p laminotomy, microdiskectomy 02/2009  . HOH (hard of hearing)    uses amplifiers   . Nephrolithiasis   . PAD (peripheral artery disease) ([place]) 04/2014   s/p L Common Iliac Stent  . Palpitations   . 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 Pneumonia    hx of  pna  . Pure hypercholesterolemia    statin intolerance  . Type II or unspecified type diabetes mellitus without mention of complication, not stated as uncontrolled    insulin dependent  . Unspecified essential hypertension     Past Surgical History:  Procedure Laterality Date  . ABDOMINAL AORTAGRAM N/A 05/13/2014   Procedure: ABDOMINAL [name];  Surgeon: Wellington Hampshire, MD;  Location: [place] CATH LAB;  Service: Cardiovascular;  Laterality: N/A;  . ANTERIOR CERVICAL DECOMP/DISCECTOMY FUSION N/A 07/28/2014   Procedure: ANTERIOR CERVICAL DECOMPRESSION/DISCECTOMY FUSION CERVICAL FOUR-FIVE CERVICAL FIVE-SIX ;  Surgeon: Earnie Larsson, MD;  Location: [place] NEURO ORS;  Service: Neurosurgery;  Laterality: N/A;  . APPENDECTOMY    . arthroscopy right knee    . BACK SURGERY    . CARDIAC CATHETERIZATION N/A 03/09/2015   Procedure: Left Heart Cath and Cors/Grafts Angiography;  Surgeon: [name] Sine, MD;  Location: [place] CV LAB;  Service: Cardiovascular;  Laterality: N/A;  . CHOLECYSTECTOMY    . CORONARY ARTERY BYPASS GRAFT     X 4 2009 (LIMA to LAD, SVG to first cicumflex marginal branch with sequential SVG to second cicumflex marginal branch  and saphenous vein to posterior descending coronary artery with endoscopic,vein harvest rt. lower exttremity by Dr.Owen on March 31,2009  . CYST REMOVED     FROM SPINE  . CYSTOURETHROSCOPY     right retrograde pyelogram,manipulate stone in the renal pelvis, rt. double-j catheter.  [name] Kitchen EYE SURGERY     . KNEE SURGERY     rt knee  . LEFT HEART CATH AND CORS/GRAFTS ANGIOGRAPHY N/A 02/20/2017   Procedure: LEFT HEART CATH AND CORS/GRAFTS ANGIOGRAPHY;  Surgeon: Leonie Man, MD;  Location: [place] CV LAB;  Service: Cardiovascular;  Laterality: N/A;  . LEFT HEART CATH AND CORS/GRAFTS ANGIOGRAPHY N/A 05/28/2018   Procedure: LEFT HEART CATH AND CORS/GRAFTS ANGIOGRAPHY;  Surgeon: Burnell Blanks, MD;  Location: [place] CV LAB;  Service: Cardiovascular;  Laterality: N/A;  . NEPHROLITHOTOMY     PERCUTANEOUS  . PARS PLANA VITRECTOMY     lt. eye,retinal photocoagulation lt. eye, membrane peel lt. eye.  [name] Kitchen PERCUTANEOUS STENT INTERVENTION Left 05/13/2014   Procedure: PERCUTANEOUS STENT INTERVENTION;  Surgeon: Wellington Hampshire, MD;  Location: [place] CATH LAB;  Service: Cardiovascular;  Laterality: Left;  COMMON ILLIAC  . re-exploratin of laminectomy  05/2008   (RT L2-3) WITH REDO MICRODISKECTOMY     Current Outpatient Medications  Medication Sig Dispense Refill  . acetaminophen (TYLENOL) 325 MG tablet Take 650 mg by mouth every 6 (six) hours as needed (pain).    [name] Kitchen amLODipine (NORVASC) 10 MG tablet Take 1 tablet (10 mg total) by mouth daily. 30 tablet 1  . atorvastatin (LIPITOR) 20 MG tablet Take 1 tablet (20 mg total) by mouth at bedtime. 30 tablet 1  . bisacodyl (DULCOLAX) 5 MG EC tablet Take 5 mg by mouth every 3 (three) days.     [name] Kitchen docusate sodium (COLACE) 100 MG capsule Take 1 capsule (100 mg total) by mouth 2 (two) times daily. 10 capsule 0  . esomeprazole (NEXIUM) 40 MG capsule Take 1 capsule (40 mg total) by mouth 2 (two) times daily before a meal. 60 capsule 0  . glipiZIDE (GLUCOTROL) 5 MG tablet Take 0.5 tablets (2.5 mg total) by mouth 2 (two) times daily before a meal. 60 tablet 0  . Insulin Glargine (LANTUS) 100 UNIT/ML Solostar Pen Inject 18 Units into the skin at bedtime. 15 mL 11  . isosorbide mononitrate (IMDUR) 30 MG 24 hr tablet Take 1 tablet (30 mg total) by mouth daily.  90 tablet 3  . lisinopril (ZESTRIL) 20 MG tablet Take 1 tablet (20 mg total) by mouth daily. 30 tablet 0  . Melatonin 3 MG TABS Take 1 tablet (3 mg total) by mouth at bedtime. 30 tablet 0  . Multiple Vitamin (MULTIVITAMIN WITH MINERALS) TABS tablet Take 1 tablet by mouth daily.    . nitroGLYCERIN (NITROSTAT) 0.4 MG SL tablet Place 1 tablet (0.4 mg total) under the tongue every 5 (five) minutes as needed for chest pain. 25 tablet 3  . senna (SENOKOT) 8.6 MG TABS tablet Take 1 tablet (8.6 mg total) by mouth at bedtime as needed for mild constipation. 120 each 0  . sucralfate (CARAFATE) 1 GM/10ML suspension Take 10 mLs (1 g total) by mouth 4 (four) times  daily. 420 mL 0  . tamsulosin (FLOMAX) 0.4 MG CAPS capsule Take 1 capsule (0.4 mg total) by mouth daily after supper. 30 capsule 0   No current facility-administered medications for this visit.     Allergies:   Oxycodone hcl, Rosuvastatin calcium, Tramadol, Iohexol, and Statins    Social History:  The patient  reports that he has never smoked. He has never used smokeless tobacco. He reports that he does not drink alcohol or use drugs.   Family History:  The patient's family history includes Coronary artery disease in his brother and sister; Diabetes in his brother; Heart attack in his father; Other in his mother and another family member.    ROS:  Please see the history of present illness.   Otherwise, review of systems are positive for none.   All other systems are reviewed and negative.    PHYSICAL EXAM: VS:  BP (!) 152/64   Pulse 78   Ht 5\' 5"  (1.651 m)   Wt 152 lb (68.9 kg)   SpO2 96%   BMI 25.29 kg/m  , BMI Body mass index is 25.29 kg/m. Frail elderly male   HEENT: normal  Neck: no JVD, or masses. Bilateral carotid bruit.  Cardiac: RRR; no  rubs, or gallops,no edema . 2/6 SEM at the base.  Respiratory:  clear to auscultation bilaterally, normal work of breathing GI: soft, nontender, nondistended, + BS MS: no deformity or  atrophy  Skin: warm and dry, no rash Neuro:  Strength and sensation are intact Psych: euthymic mood, full affect Vascular: Femoral pulses are normal.   EKG:   SR old IMI LAE 07/22/18    Recent Labs: 07/04/2018: NT-Pro BNP 137 07/08/2018: B Natriuretic Peptide 37.8 07/11/2018: Magnesium 2.0 07/24/2018: ALT 36; BUN 15; Creatinine, Ser 0.91; Hemoglobin 14.3; Platelets 351; Potassium 3.8; Sodium 129    Lipid Panel    Component Value Date/Time   CHOL 138 02/21/2017 0222   TRIG 44 02/21/2017 0222   HDL 43 02/21/2017 0222   CHOLHDL 3.2 02/21/2017 0222   VLDL 9 02/21/2017 0222   LDLCALC 86 02/21/2017 0222      Wt Readings from Last 3 Encounters:  10/15/18 152 lb (68.9 kg)  10/01/18 149 lb (67.6 kg)  07/23/18 148 lb (67.1 kg)         ASSESSMENT AND PLAN:  1.  Peripheral arterial disease: Status post left iliac artery stenting. He is known to have bilateral SFA occlusion.  No clear claudication at the present time.  He seems to be more limited by his back situation.  Vascular studies showed stable ABI and patent left common iliac stent.12/04/17   Continue medical therapy. F/U Dr [name]   2. Coronary artery disease : patent grafts with diffuse distal disease 05/28/18 intolerant to ranexa and imdur   3. Hyperlipidemia: Continue treatment with atorvastatin with a target LDL of less than 70.  4. Essential hypertension: Well controlled.  Continue current medications and low sodium Dash type diet.    5. Bilateral carotid disease: Previous Doppler showed mild nonobstructive disease. F/U duplex October   6. FTT: down hill course earlier this year with multiple falls and SAH and long rehab course     Disposition:   FU  Arida 6 months and me in a year   Signed, Jenkins Rouge, MD  10/15/2018 9:21 AM    [place]

## 2018-10-01 NOTE — Patient Instructions (Addendum)
Medication Instructions:  START Isosorbide (Imdur) 30 mg once daily  If you need a refill on your cardiac medications before your next appointment, please call your pharmacy.   Lab work: None ordered If you have labs (blood work) drawn today and your tests are completely normal, you will receive your results only by: [name] Kitchen MyChart Message (if you have MyChart) OR . A paper copy in the mail If you have any lab test that is abnormal or we need to change your treatment, we will call you to review the results.  Testing/Procedures: Your physician has requested that you have a carotid duplex in 6 months. This test is an ultrasound of the carotid arteries in your neck. It looks at blood flow through these arteries that supply the brain with blood. Allow one hour for this exam. There are no restrictions or special instructions. This will take place at [place], [address].  Follow-Up: At [facility], you and your health needs are our priority.  As part of our continuing mission to provide you with exceptional heart care, we have created designated Provider Care Teams.  These Care Teams include your primary Cardiologist (physician) and Advanced Practice Providers (APPs -  Physician Assistants and Nurse Practitioners) who all work together to provide you with the care you need, when you need it. You will need a follow up appointment in 12 months.  Please call our office 2 months in advance to schedule this appointment.  You may see Kathlyn Sacramento, MD or one of the following Advanced Practice Providers on your designated Care Team:   Kerin Ransom, PA-C Roby Lofts, [place] . Sande Rives, PA-C

## 2018-10-01 NOTE — Progress Notes (Signed)
Cardiology Office Note   Date:  10/01/2018   ID:  Ricardo Le, DOB 02/05/1941, MRN [id number]  PCP:  Angelina Sheriff, MD  Cardiologist:  Dr. Johnsie Cancel  Chief Complaint  Patient presents with   Follow-up      History of Present Illness: Ricardo Le is a 78 y.o. male who presents for a follow up visit regarding PAD. He has known history of coronary artery disease status post coronary artery bypass grafting in 2009 .  Cardiac cath in 2017, 2019 and 2020 showed patent grafts with diffuse distal disease.  Other past medical history include remote history of pancreatic cancer which was treated successfully. He is status post left common iliac artery stent placement in March 2016. He is known to have bilateral SFA occlusion with 1 vessel runoff below the knee bilaterally. He had previous neck surgery in 2016 with improvement in his neuropathic pain.  Most recent vascular studies in October 2019 showed patent left common iliac artery stent.  ABI was 0.7 on the right and 0.84 on the left.  He reports having back injection last year and since then he has not been able to do much walking.  He is also limited by exertional chest pain and fatigue.  His legs get tired but no clear discomfort.  He is very frustrated.  He reports that Ranexa makes him feel worse.  Past Medical History:  Diagnosis Date   CAD (coronary artery disease)    a. 04/2007 s/p CABG x 4  - LIMA->LAD, VG->OM1->Om2, VG->PDA;  b. 12/2008 & 06/2010 Caths - Native multivessel dzs with 4/4 patent grafts. c. cath 03/09/2015 patent grafts, unchanged from prior cath.   Cancer [facility])    pancreatic  March 2011   Carotid bruit    a. 07/2010 U/S- 0-39% bilat ICA stenosis   Chest pain    Noncardiac probably related to reflux   DDD (degenerative disc disease)    several surgeries   Fracture acetabulum-closed ([place]) 07/08/2018   GERD (gastroesophageal reflux disease)    HNP (herniated nucleus pulposus), lumbar    a.  L2-3, s/p laminotomy, microdiskectomy 02/2009   HOH (hard of hearing)    uses amplifiers    Nephrolithiasis    PAD (peripheral artery disease) ([place]) 04/2014   s/p L Common Iliac Stent   Palpitations    Pancreatic mass    a. 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   Pneumonia    hx of  pna   Pure hypercholesterolemia    statin intolerance   Type II or unspecified type diabetes mellitus without mention of complication, not stated as uncontrolled    insulin dependent   Unspecified essential hypertension     Past Surgical History:  Procedure Laterality Date   ABDOMINAL AORTAGRAM N/A 05/13/2014   Procedure: ABDOMINAL [name];  Surgeon: Wellington Hampshire, MD;  Location: [place] CATH LAB;  Service: Cardiovascular;  Laterality: N/A;   ANTERIOR CERVICAL DECOMP/DISCECTOMY FUSION N/A 07/28/2014   Procedure: ANTERIOR CERVICAL DECOMPRESSION/DISCECTOMY FUSION CERVICAL FOUR-FIVE CERVICAL FIVE-SIX ;  Surgeon: Earnie Larsson, MD;  Location: [place] NEURO ORS;  Service: Neurosurgery;  Laterality: N/A;   APPENDECTOMY     arthroscopy right knee     BACK SURGERY     CARDIAC CATHETERIZATION N/A 03/09/2015   Procedure: Left Heart Cath and Cors/Grafts Angiography;  Surgeon: [name] Sine, MD;  Location: [place] CV LAB;  Service: Cardiovascular;  Laterality: N/A;   CHOLECYSTECTOMY     CORONARY  ARTERY BYPASS GRAFT     X 4 2009 (LIMA to LAD, SVG to first cicumflex marginal branch with sequential SVG to second cicumflex marginal branch  and saphenous vein to posterior descending coronary artery with endoscopic,vein harvest rt. lower exttremity by Dr.Owen on March 31,2009   CYST REMOVED     FROM SPINE   CYSTOURETHROSCOPY     right retrograde pyelogram,manipulate stone in the renal pelvis, rt. double-j catheter.   EYE SURGERY     KNEE SURGERY     rt knee   LEFT HEART CATH AND CORS/GRAFTS ANGIOGRAPHY N/A 02/20/2017   Procedure: LEFT HEART CATH AND CORS/GRAFTS ANGIOGRAPHY;  Surgeon:  Leonie Man, MD;  Location: [place] CV LAB;  Service: Cardiovascular;  Laterality: N/A;   LEFT HEART CATH AND CORS/GRAFTS ANGIOGRAPHY N/A 05/28/2018   Procedure: LEFT HEART CATH AND CORS/GRAFTS ANGIOGRAPHY;  Surgeon: Burnell Blanks, MD;  Location: [place] CV LAB;  Service: Cardiovascular;  Laterality: N/A;   NEPHROLITHOTOMY     PERCUTANEOUS   PARS PLANA VITRECTOMY     lt. eye,retinal photocoagulation lt. eye, membrane peel lt. eye.   PERCUTANEOUS STENT INTERVENTION Left 05/13/2014   Procedure: PERCUTANEOUS STENT INTERVENTION;  Surgeon: Wellington Hampshire, MD;  Location: [place] CATH LAB;  Service: Cardiovascular;  Laterality: Left;  COMMON ILLIAC   re-exploratin of laminectomy  05/2008   (RT L2-3) WITH REDO MICRODISKECTOMY     Current Outpatient Medications  Medication Sig Dispense Refill   acetaminophen (TYLENOL) 325 MG tablet Take 650 mg by mouth every 6 (six) hours as needed (pain).     amLODipine (NORVASC) 10 MG tablet Take 1 tablet (10 mg total) by mouth daily. 30 tablet 1   atorvastatin (LIPITOR) 20 MG tablet Take 1 tablet (20 mg total) by mouth at bedtime. 30 tablet 1   bisacodyl (DULCOLAX) 5 MG EC tablet Take 5 mg by mouth every 3 (three) days.      docusate sodium (COLACE) 100 MG capsule Take 1 capsule (100 mg total) by mouth 2 (two) times daily. 10 capsule 0   esomeprazole (NEXIUM) 40 MG capsule Take 1 capsule (40 mg total) by mouth 2 (two) times daily before a meal. 60 capsule 0   glipiZIDE (GLUCOTROL) 5 MG tablet Take 0.5 tablets (2.5 mg total) by mouth 2 (two) times daily before a meal. 60 tablet 0   Insulin Glargine (LANTUS) 100 UNIT/ML Solostar Pen Inject 18 Units into the skin at bedtime. 15 mL 11   lisinopril (ZESTRIL) 20 MG tablet Take 1 tablet (20 mg total) by mouth daily. 30 tablet 0   Melatonin 3 MG TABS Take 1 tablet (3 mg total) by mouth at bedtime. 30 tablet 0   Multiple Vitamin (MULTIVITAMIN WITH MINERALS) TABS tablet Take 1 tablet by  mouth daily.     nitroGLYCERIN (NITROSTAT) 0.4 MG SL tablet Place 1 tablet (0.4 mg total) under the tongue every 5 (five) minutes as needed for chest pain. 25 tablet 3   senna (SENOKOT) 8.6 MG TABS tablet Take 1 tablet (8.6 mg total) by mouth at bedtime as needed for mild constipation. 120 each 0   sucralfate (CARAFATE) 1 GM/10ML suspension Take 10 mLs (1 g total) by mouth 4 (four) times daily. 420 mL 0   tamsulosin (FLOMAX) 0.4 MG CAPS capsule Take 1 capsule (0.4 mg total) by mouth daily after supper. 30 capsule 0   No current facility-administered medications for this visit.     Allergies:   Oxycodone hcl, Rosuvastatin calcium, Tramadol,  Iohexol, and Statins    Social History:  The patient  reports that he has never smoked. He has never used smokeless tobacco. He reports that he does not drink alcohol or use drugs.   Family History:  The patient's family history includes Coronary artery disease in his brother and sister; Diabetes in his brother; Heart attack in his father; Other in his mother and another family member.    ROS:  Please see the history of present illness.   Otherwise, review of systems are positive for none.   All other systems are reviewed and negative.    PHYSICAL EXAM: VS:  BP (!) 142/50 (BP Location: Left Arm, Patient Position: Sitting, Cuff Size: Normal)    Pulse 67    Temp 98.1 F (36.7 C)    Ht 5\' 5"  (1.651 m)    Wt 149 lb (67.6 kg)    BMI 24.79 kg/m  , BMI Body mass index is 24.79 kg/m. GEN: Well nourished, well developed, in no acute distress  HEENT: normal  Neck: no JVD, or masses. Bilateral carotid bruit.  Cardiac: RRR; no  rubs, or gallops,no edema . 2/6 SEM at the base.  Respiratory:  clear to auscultation bilaterally, normal work of breathing GI: soft, nontender, nondistended, + BS MS: no deformity or atrophy  Skin: warm and dry, no rash Neuro:  Strength and sensation are intact Psych: euthymic mood, full affect Vascular: Femoral pulses are  normal.   EKG:  EKG is not ordered today.    Recent Labs: 07/04/2018: NT-Pro BNP 137 07/08/2018: B Natriuretic Peptide 37.8 07/11/2018: Magnesium 2.0 07/24/2018: ALT 36; BUN 15; Creatinine, Ser 0.91; Hemoglobin 14.3; Platelets 351; Potassium 3.8; Sodium 129    Lipid Panel    Component Value Date/Time   CHOL 138 02/21/2017 0222   TRIG 44 02/21/2017 0222   HDL 43 02/21/2017 0222   CHOLHDL 3.2 02/21/2017 0222   VLDL 9 02/21/2017 0222   LDLCALC 86 02/21/2017 0222      Wt Readings from Last 3 Encounters:  10/01/18 149 lb (67.6 kg)  07/23/18 148 lb (67.1 kg)  07/21/18 147 lb 11.3 oz (67 kg)         ASSESSMENT AND PLAN:  1.  Peripheral arterial disease: Status post left iliac artery stenting. He is known to have bilateral SFA occlusion.  No clear claudication at the present time.  He seems to be more limited by his back situation.  Recent vascular studies showed stable ABI and patent left common iliac stent.  Continue medical therapy.  2. Coronary artery disease involving native coronary arteries with other forms of angina: It is not entirely clear why he is having that much chest discomfort.  Some of the symptoms are atypical and could be GI in nature.  I reviewed most recent cardiac catheterization in April which showed patent grafts.  He reports feeling worse with Ranexa.  I elected to add Imdur 30 mg daily.  There is a possibility of physical deconditioning. Keep follow-up with Dr. Johnsie Cancel.  3. Hyperlipidemia: Continue treatment with atorvastatin with a target LDL of less than 70.  4. Essential hypertension: Blood pressure is mildly elevated .  Imdur was added.  5. Bilateral carotid disease: Previous Doppler showed mild nonobstructive disease.  He continues to have bilateral bruits and I plan to repeat carotid Doppler in 6 months.   Disposition:   FU with me in 1 year  Signed, Kathlyn Sacramento, MD  10/01/2018 9:07 AM    [place] °

## 2018-10-02 ENCOUNTER — Telehealth: Payer: Self-pay | Admitting: Cardiovascular Disease

## 2018-10-02 NOTE — Telephone Encounter (Signed)
New message:     Patient wife calling concering her husband medication he is having some fast HR.pleaase call patient.

## 2018-10-03 DIAGNOSIS — K219 Gastro-esophageal reflux disease without esophagitis: Secondary | ICD-10-CM | POA: Diagnosis not present

## 2018-10-03 DIAGNOSIS — R1013 Epigastric pain: Secondary | ICD-10-CM | POA: Diagnosis not present

## 2018-10-03 DIAGNOSIS — K5909 Other constipation: Secondary | ICD-10-CM | POA: Diagnosis not present

## 2018-10-03 NOTE — Telephone Encounter (Signed)
Spoke to patient he stated he saw Dr.Arida yesterday for a follow up visit.Stated he told him he has been having chest pain with exertion.Stated he cannot do anything without having chest pain for the past 3 to 4 months.Dr.Arida prescribed Isosorbide 30 mg daily.Stated he took one and it caused his heart to pound and a headache.Advised he can decrease and take 1/2 tablet 15 mg.Advised to stop if he continues to have headache and pounding heart.He has appointment with Dr.Nishan 9/1.He refuses to see a PA sooner.Advised to keep appointment as planned and go to ED if needed.

## 2018-10-15 ENCOUNTER — Encounter: Payer: Self-pay | Admitting: Cardiovascular Disease

## 2018-10-15 ENCOUNTER — Ambulatory Visit (INDEPENDENT_AMBULATORY_CARE_PROVIDER_SITE_OTHER): Payer: PPO | Admitting: Cardiovascular Disease

## 2018-10-15 ENCOUNTER — Other Ambulatory Visit: Payer: Self-pay

## 2018-10-15 VITALS — BP 152/64 | HR 78 | Ht 65.0 in | Wt 152.0 lb

## 2018-10-15 DIAGNOSIS — R0989 Other specified symptoms and signs involving the circulatory and respiratory systems: Secondary | ICD-10-CM

## 2018-10-15 DIAGNOSIS — I739 Peripheral vascular disease, unspecified: Secondary | ICD-10-CM

## 2018-10-15 DIAGNOSIS — I2510 Atherosclerotic heart disease of native coronary artery without angina pectoris: Secondary | ICD-10-CM

## 2018-10-15 NOTE — Patient Instructions (Signed)
Your physician recommends that you continue on your current medications as directed. Please refer to the Current Medication list given to you today.   Your physician has requested that you have a carotid duplex. This test is an ultrasound of the carotid arteries in your neck. It looks at blood flow through these arteries that supply the brain with blood. Allow one hour for this exam. There are no restrictions or special instructions. DUE IN  October  Your physician wants you to follow-up in:  [place] will receive a reminder letter in the mail two months in advance. If you don't receive a letter, please call our office to schedule the follow-up appointment.

## 2018-10-16 ENCOUNTER — Other Ambulatory Visit: Payer: Self-pay | Admitting: Internal Medicine

## 2018-10-16 ENCOUNTER — Ambulatory Visit (INDEPENDENT_AMBULATORY_CARE_PROVIDER_SITE_OTHER): Payer: PPO

## 2018-10-16 ENCOUNTER — Ambulatory Visit (INDEPENDENT_AMBULATORY_CARE_PROVIDER_SITE_OTHER): Payer: PPO | Admitting: Internal Medicine

## 2018-10-16 ENCOUNTER — Encounter: Payer: Self-pay | Admitting: Internal Medicine

## 2018-10-16 DIAGNOSIS — R0609 Other forms of dyspnea: Secondary | ICD-10-CM

## 2018-10-16 DIAGNOSIS — I10 Essential (primary) hypertension: Secondary | ICD-10-CM | POA: Diagnosis not present

## 2018-10-16 DIAGNOSIS — R0600 Dyspnea, unspecified: Secondary | ICD-10-CM

## 2018-10-16 DIAGNOSIS — R0602 Shortness of breath: Secondary | ICD-10-CM | POA: Diagnosis not present

## 2018-10-16 LAB — BASIC METABOLIC PANEL
BUN: 17 mg/dL (ref 6–23)
CO2: 32 mEq/L (ref 19–32)
Calcium: 9.7 mg/dL (ref 8.4–10.5)
Chloride: 98 mEq/L (ref 96–112)
Creatinine, Ser: 0.83 mg/dL (ref 0.40–1.50)
GFR: 89.83 mL/min (ref >60.00)
Glucose, Bld: 184 mg/dL — ABNORMAL HIGH (ref 70–99)
Potassium: 3.9 mEq/L (ref 3.5–5.1)
Sodium: 139 mEq/L (ref 135–145)

## 2018-10-16 LAB — TSH: TSH: 1.69 u[IU]/mL (ref 0.35–4.50)

## 2018-10-16 LAB — CBC WITH DIFFERENTIAL/PLATELET
Basophils Absolute: 0.1 10*3/uL (ref 0.0–0.1)
Basophils Relative: 1.1 % (ref 0.0–3.0)
Eosinophils Absolute: 0.1 10*3/uL (ref 0.0–0.7)
Eosinophils Relative: 1.6 % (ref 0.0–5.0)
HCT: 41.3 % (ref 39.0–52.0)
Hemoglobin: 14.3 g/dL (ref 13.0–17.0)
Lymphocytes Relative: 23.9 % (ref 12.0–46.0)
Lymphs Abs: 2.1 10*3/uL (ref 0.7–4.0)
MCHC: 34.5 g/dL (ref 30.0–36.0)
MCV: 93.6 fl (ref 78.0–100.0)
Monocytes Absolute: 1.1 10*3/uL — ABNORMAL HIGH (ref 0.1–1.0)
Monocytes Relative: 12.5 % — ABNORMAL HIGH (ref 3.0–12.0)
Neutro Abs: 5.3 10*3/uL (ref 1.4–7.7)
Neutrophils Relative %: 60.9 % (ref 43.0–77.0)
Platelets: 290 10*3/uL (ref 150.0–400.0)
RBC: 4.41 Mil/uL (ref 4.22–5.81)
RDW: 13.2 % (ref 11.5–15.5)
WBC: 8.7 10*3/uL (ref 4.0–10.5)

## 2018-10-16 LAB — D-DIMER, QUANTITATIVE: D-Dimer, Quant: 1.3 mcg/mL FEU — ABNORMAL HIGH (ref <0.50)

## 2018-10-16 LAB — BRAIN NATRIURETIC PEPTIDE: Pro B Natriuretic peptide (BNP): 47 pg/mL (ref 0.0–100.0)

## 2018-10-16 LAB — TROPONIN I (HIGH SENSITIVITY): High Sens Troponin I: 9 ng/L (ref 2–17)

## 2018-10-16 MED ORDER — TELMISARTAN 80 MG PO TABS: 80.0000 mg | ORAL_TABLET | Freq: Every day | ORAL | 11 refills | Status: DC

## 2018-10-16 NOTE — Assessment & Plan Note (Addendum)
Onset March 2020 assoc with ex cp and noct smothering better at 30 degrees  - 10/16/2018   Walked RA  2 laps @  approx 223ft each @ slow pace  stopped due to  End of study, c/o sob and cp p 100 ft but sats never lower than 98%     Symptoms are markedly disproportionate to objective findings and not clear to what extent this is actually a pulmonary  problem but pt does appear to have difficult to sort out respiratory symptoms of unknown origin for which  DDX  = almost all start with A and  include Adherence, Ace Inhibitors, Acid Reflux, Active Sinus Disease, Alpha 1 Antitripsin deficiency, Anxiety masquerading as Airways dz,  ABPA,  Allergy(esp in young), Aspiration (esp in elderly), Adverse effects of meds,  Active smoking or Vaping, A bunch of PE's/clot burden (a few small clots can't cause this syndrome unless there is already severe underlying pulm or vascular dz with poor reserve),  Anemia or thyroid disorder, plus two Bs  = Bronchiectasis and Beta blocker use..and one C= CHF     Adherence is always the initial "prime suspect" and is a multilayered concern that requires a "trust but verify" approach in every patient - starting with knowing how to use medications, especially inhalers, correctly, keeping up with refills and understanding the fundamental difference between maintenance and prns vs those medications only taken for a very short course and then stopped and not refilled.  - return with all meds in hand using a trust but verify approach to confirm accurate Medication  Reconciliation The principal here is that until we are certain that the  patients are doing what we've asked, it makes no sense to ask them to do more.   ACEi adverse effects at the  top of the usual list of suspects and the only way to rule it out is a trial off > see a/p    ? Acid (or non-acid) GERD > always difficult to exclude as up to 75% of pts in some series report no assoc GI/ Heartburn symptoms and he's got atypical and  typical CP > rec max (24h)  acid suppression and diet restrictions/ reviewed and instructions given in writing.    ? Anxiety/depression/deconditioning  > usually at the bottom of this list of usual suspects but should  higher on this pt's based on H and P and note already on psychotropics and may interfere with adherence and also interpretation of response or lack thereof to symptom management which can be quite subjective.   ? Allergy/ asthma > nothing at all to suggest  ? Anemia/ thyroid dz>  Ruled out today   ? A Bunch of PE's > can't exclude with d dimer though prob false pos  >>>  since creat ok will proceed with CTa   ? Chf/ cardiac asthma or IHD related cp>>> nl dimer rules against

## 2018-10-16 NOTE — Progress Notes (Signed)
Ricardo Le, male    DOB: 12-24-1940      MRN: [id number]   Brief patient profile:  89 yowm never smoker/ retired [employer]  s/p cabg in 2009 with no resp problems at all  until onset of doe x march 2020 and has developed a fixed pattern of doe x 50 ft fast or 100 ft slow  with neg cards w/u by Johnsie Cancel so referred to pulmonary clinic 10/16/2018 by Dr   Johnsie Cancel    History of Present Illness  10/16/2018  Pulmonary/ 1st office eval/Yvonne Petite  Chief Complaint  Patient presents with  . Pulmonary Consult    Referred by Dr Johnsie Cancel "my heart is not getting enough oxygen". He c/o SOB if he walks fast for approx 100 ft.   Dyspnea:  Indolent onset of relatively  Fixed pattern doe x 6 m assoc with new gen cp/upper abd discomfort , no nausea /sweating and only time he ever has it is  with activity and also at hs if doesn't prop up  Cough: no Sleep: new problem has not been able to lie flat, smothering  Sensation, supposed to be on ppi bid but just taking nexium with bfast  SABA use: none   No obvious day to day or daytime variability or assoc excess/ purulent sputum or mucus plugs or hemoptysis or cp or chest tightness, subjective wheeze or overt sinus or hb symptoms.   Sleeping as above without nocturnal  or early am exacerbation  of respiratory  c/o's or need for noct saba. Also denies any obvious fluctuation of symptoms with weather or environmental changes or other aggravating or alleviating factors except as outlined above   No unusual exposure hx or h/o childhood pna/ asthma or knowledge of premature birth.  Current Allergies, Complete Past Medical History, Past Surgical History, Family History, and Social History were reviewed in [employer] record.  ROS  The following are not active complaints unless bolded Hoarseness, sore throat, dysphagia, dental problems, itching, sneezing,  nasal congestion or discharge of excess mucus or purulent secretions, ear ache,   fever, chills,  sweats, unintended wt loss or wt gain, classically pleuritic or exertional cp,  orthopnea pnd or arm/hand swelling  or leg swelling, presyncope, palpitations, abdominal pain, anorexia, nausea, vomiting, diarrhea  or change in bowel habits or change in bladder habits, change in stools or change in urine, dysuria, hematuria,  rash, arthralgias, visual complaints, headache, numbness, weakness or ataxia or problems with walking or coordination,  change in mood or  memory.             Past Medical History:  Diagnosis Date  . CAD (coronary artery disease)    a. 04/2007 s/p CABG x 4  - LIMA->LAD, VG->OM1->Om2, VG->PDA;  b. 12/2008 & 06/2010 Caths - Native multivessel dzs with 4/4 patent grafts. c. cath 03/09/2015 patent grafts, unchanged from prior cath.  . Cancer [facility])    pancreatic  March 2011  . Carotid bruit    a. 07/2010 U/S- 0-39% bilat ICA stenosis  . Chest pain    Noncardiac probably related to reflux  . DDD (degenerative disc disease)    several surgeries  . Fracture acetabulum-closed ([place]) 07/08/2018  . GERD (gastroesophageal reflux disease)   . HNP (herniated nucleus pulposus), lumbar    a. L2-3, s/p laminotomy, microdiskectomy 02/2009  . HOH (hard of hearing)    uses amplifiers   . Nephrolithiasis   . PAD (peripheral artery disease) ([place]) 04/2014   s/p  L Common Iliac Stent  . Palpitations   . 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 Pneumonia    hx of  pna  . Pure hypercholesterolemia    statin intolerance  . Type II or unspecified type diabetes mellitus without mention of complication, not stated as uncontrolled    insulin dependent  . Unspecified essential hypertension     Outpatient Medications Prior to Visit  Medication Sig Dispense Refill  . acetaminophen (TYLENOL) 325 MG tablet Take 650 mg by mouth every 6 (six) hours as needed (pain).    [name] Kitchen amLODipine (NORVASC) 10 MG tablet Take 1 tablet (10 mg total) by mouth daily. 30 tablet  1  . atorvastatin (LIPITOR) 20 MG tablet Take 1 tablet (20 mg total) by mouth at bedtime. 30 tablet 1  . bisacodyl (DULCOLAX) 5 MG EC tablet Take 5 mg by mouth every 3 (three) days.     [name] Kitchen docusate sodium (COLACE) 100 MG capsule Take 1 capsule (100 mg total) by mouth 2 (two) times daily. 10 capsule 0  . esomeprazole (NEXIUM) 40 MG capsule Take 1 capsule (40 mg total) by mouth 2 (two) times daily before a meal. 60 capsule 0  . glipiZIDE (GLUCOTROL) 5 MG tablet Take 0.5 tablets (2.5 mg total) by mouth 2 (two) times daily before a meal. 60 tablet 0  . Insulin Glargine (LANTUS) 100 UNIT/ML Solostar Pen Inject 18 Units into the skin at bedtime. 15 mL 11  . isosorbide mononitrate (IMDUR) 30 MG 24 hr tablet Take 1 tablet (30 mg total) by mouth daily. 90 tablet 3  . lisinopril (ZESTRIL) 20 MG tablet Take 1 tablet (20 mg total) by mouth daily. 30 tablet 0  . Melatonin 3 MG TABS Take 1 tablet (3 mg total) by mouth at bedtime. 30 tablet 0  . Multiple Vitamin (MULTIVITAMIN WITH MINERALS) TABS tablet Take 1 tablet by mouth daily.    . nitroGLYCERIN (NITROSTAT) 0.4 MG SL tablet Place 1 tablet (0.4 mg total) under the tongue every 5 (five) minutes as needed for chest pain. 25 tablet 3  . senna (SENOKOT) 8.6 MG TABS tablet Take 1 tablet (8.6 mg total) by mouth at bedtime as needed for mild constipation. 120 each 0  . sucralfate (CARAFATE) 1 GM/10ML suspension Take 10 mLs (1 g total) by mouth 4 (four) times daily. 420 mL 0  . tamsulosin (FLOMAX) 0.4 MG CAPS capsule Take 1 capsule (0.4 mg total) by mouth daily after supper. 30 capsule 0      Objective:     BP 130/60 (BP Location: Left Arm, Cuff Size: Normal)   Pulse 65   Temp 98.3 F (36.8 C) (Oral)   Ht 5\' 2"  (1.575 m)   Wt 151 lb (68.5 kg)   SpO2 97% Comment: on RA  BMI 27.62 kg/m   SpO2: 97 %(on RA)   Somber amb wm / challenging historian    HEENT: nl dentition, turbinates bilaterally, and oropharynx. Nl external ear canals without cough reflex    NECK :  without JVD/Nodes/TM/ nl carotid upstrokes bilaterally   LUNGS: no acc muscle use,  Nl contour chest which is clear to A and P bilaterally without cough on insp or exp maneuvers   CV:  RRR  no s3 or murmur or increase in P2, and no edema   ABD:  soft and nontender with nl inspiratory excursion in the supine position. No bruits or organomegaly appreciated, bowel sounds nl  MS:  Nl gait/ ext warm without deformities, calf tenderness, cyanosis or clubbing No obvious joint restrictions   SKIN: warm and dry without lesions    NEURO:  alert, approp, nl sensorium with  no motor or cerebellar deficits apparent.      CXR PA and Lateral:   10/16/2018 :    I personally reviewed images and agree with radiology impression as follows:   Right lower lobe atelectasis versus less likely airspace Consolidation. My impression:  RML compression related to large intestine interpositioned ant to liver     Labs ordered/ reviewed:    Chemistry      Component Value Date/Time   NA 139 10/16/2018 1039   NA 138 07/04/2018 1311   K 3.9 10/16/2018 1039   CL 98 10/16/2018 1039   CO2 32 10/16/2018 1039   BUN 17 10/16/2018 1039   BUN 21 07/04/2018 1311   CREATININE 0.83 10/16/2018 1039      Component Value Date/Time   CALCIUM 9.7 10/16/2018 1039                             Lab Results  Component Value Date   WBC 8.7 10/16/2018   HGB 14.3 10/16/2018   HCT 41.3 10/16/2018   MCV 93.6 10/16/2018   PLT 290.0 10/16/2018     Lab Results  Component Value Date   DDIMER 1.30 (H) 10/16/2018      Lab Results  Component Value Date   TSH 1.69 10/16/2018     Lab Results  Component Value Date   PROBNP 47.0 10/16/2018             Assessment   DOE (dyspnea on exertion) Onset March 2020 assoc with ex cp and noct smothering better at 30 degrees  - 10/16/2018   Walked RA  2 laps @  approx 26ft each @ slow pace  stopped due to  End of study, c/o sob and cp p 100 ft but sats never  lower than 98%     Symptoms are markedly disproportionate to objective findings and not clear to what extent this is actually a pulmonary  problem but pt does appear to have difficult to sort out respiratory symptoms of unknown origin for which  DDX  = almost all start with A and  include Adherence, Ace Inhibitors, Acid Reflux, Active Sinus Disease, Alpha 1 Antitripsin deficiency, Anxiety masquerading as Airways dz,  ABPA,  Allergy(esp in young), Aspiration (esp in elderly), Adverse effects of meds,  Active smoking or Vaping, A bunch of PE's/clot burden (a few small clots can't cause this syndrome unless there is already severe underlying pulm or vascular dz with poor reserve),  Anemia or thyroid disorder, plus two Bs  = Bronchiectasis and Beta blocker use..and one C= CHF     Adherence is always the initial "prime suspect" and is a multilayered concern that requires a "trust but verify" approach in every patient - starting with knowing how to use medications, especially inhalers, correctly, keeping up with refills and understanding the fundamental difference between maintenance and prns vs those medications only taken for a very short course and then stopped and not refilled.  - return with all meds in hand using a trust but verify approach to confirm accurate Medication  Reconciliation The principal here is that until we are certain that the  patients are doing what we've asked, it makes no sense to ask them to do more.  ACEi adverse effects at the  top of the usual list of suspects and the only way to rule it out is a trial off > see a/p    ? Acid (or non-acid) GERD > always difficult to exclude as up to 75% of pts in some series report no assoc GI/ Heartburn symptoms and he's got atypical and typical CP > rec max (24h)  acid suppression and diet restrictions/ reviewed and instructions given in writing.    ? Anxiety/depression/deconditioning  > usually at the bottom of this list of usual suspects  but should  higher on this pt's based on H and P and note already on psychotropics and may interfere with adherence and also interpretation of response or lack thereof to symptom management which can be quite subjective.   ? Allergy/ asthma > nothing at all to suggest  ? Anemia/ thyroid dz>  Ruled out today   ? A Bunch of PE's > can't exclude with d dimer though prob false pos  >>>  since creat ok will proceed with CTa   ? Chf/ cardiac asthma or IHD related cp>>> nl dimer rules against      Essential hypertension Trial off acei 10/16/2018   In the best review of chronic cough to date ( NEJM 2016 375 [id number]) ,  ACEi are now felt to cause cough in up to  20% of pts which is a 4 fold increase from previous reports and does not include the variety of non-specific complaints we see in pulmonary clinic in pts on ACEi but previously attributed to another dx like  Copd/asthma and  include PNDS, throat and chest congestion, "bronchitis", unexplained dyspnea and noct "strangling" sensations, and hoarseness, but also  atypical /refractory GERD symptoms like dysphagia and "bad heartburn"   The only way I know  to prove this is not an "ACEi Case" is a trial off ACEi x a minimum of 6 weeks then regroup.    >>> try micardis 80 mg daily and recheck in 4 weeks       Total time devoted to counseling  > 50 % of initial 60 min office visit:  reviewed case with pt/ directly observed portions of ambulatory 02 saturation study/  discussion of options/alternatives/ personally creating written customized instructions  in presence of pt  then going over those specific  Instructions directly with the pt including how to use all of the meds but in particular covering each new medication in detail and the difference between the maintenance= "automatic" meds and the prns using an action plan format for the latter (If this problem/symptom => do that organization reading Left to right).  Please see AVS from this visit  for a full list of these instructions which I personally wrote for this pt and  are unique to this visit.      Christinia Gully, MD 10/16/2018

## 2018-10-16 NOTE — Progress Notes (Signed)
Spoke with pt and notified of results per Dr. Melvyn Novas. Pt verbalized understanding and denied any questions. Per Dr Melvyn Novas- okay to order VQ scan since the pt is allergic to Iodine and IV contrast Orders placed

## 2018-10-16 NOTE — Progress Notes (Signed)
Spoke with pt and notified of results per Dr. Wert. Pt verbalized understanding and denied any questions. 

## 2018-10-16 NOTE — Assessment & Plan Note (Addendum)
Trial off acei 10/16/2018    In the best review of chronic cough to date ( NEJM 2016 375 [id number]) ,  ACEi are now felt to cause cough in up to  20% of pts which is a 4 fold increase from previous reports and does not include the variety of non-specific complaints we see in pulmonary clinic in pts on ACEi but previously attributed to another dx like  Copd/asthma and  include PNDS, throat and chest congestion, "bronchitis", unexplained dyspnea and noct "strangling" sensations, and hoarseness, but also  atypical /refractory GERD symptoms like dysphagia and "bad heartburn"   The only way I know  to prove this is not an "ACEi Case" is a trial off ACEi x a minimum of 6 weeks then regroup.    >>> try micardis 80 mg daily and recheck in 4 weeks     Total time devoted to counseling  > 50 % of initial 60 min office visit:  reviewed case with pt/ directly observed portions of ambulatory 02 saturation study/  discussion of options/alternatives/ personally creating written customized instructions  in presence of pt  then going over those specific  Instructions directly with the pt including how to use all of the meds but in particular covering each new medication in detail and the difference between the maintenance= "automatic" meds and the prns using an action plan format for the latter (If this problem/symptom => do that organization reading Left to right).  Please see AVS from this visit for a full list of these instructions which I personally wrote for this pt and  are unique to this visit.

## 2018-10-16 NOTE — Patient Instructions (Addendum)
Stop lisinopril and replace with micardis 80 mg one daily   Take nexium 40 mg Take 30- 60 min before your first and last meals of the day   GERD (REFLUX)  is an extremely common cause of respiratory symptoms just like yours , many times with no obvious heartburn at all.    It can be treated with medication, but also with lifestyle changes including elevation of the head of your bed (ideally with 6 -8inch blocks under the headboard of your bed),  Smoking cessation, avoidance of late meals, excessive alcohol, and avoid fatty foods, chocolate, peppermint, colas, red wine, and acidic juices such as orange juice.  NO MINT OR MENTHOL PRODUCTS SO NO COUGH DROPS  USE SUGARLESS CANDY INSTEAD (Jolley ranchers or Stover's or Life Savers) or even ice chips will also do - the key is to swallow to prevent all throat clearing. NO OIL BASED VITAMINS - use powdered substitutes.  Avoid fish oil when coughing.    Please remember to go to the lab and x-ray department   for your tests - we will call you with the results when they are available.     Please schedule a follow up office visit in 4 weeks, call sooner if needed

## 2018-10-17 ENCOUNTER — Other Ambulatory Visit: Payer: Self-pay | Admitting: Internal Medicine

## 2018-10-17 ENCOUNTER — Other Ambulatory Visit: Payer: Self-pay

## 2018-10-17 ENCOUNTER — Encounter (HOSPITAL_COMMUNITY)
Admission: RE | Admit: 2018-10-17 | Discharge: 2018-10-17 | Disposition: A | Discharge status: Home / Self Care | Payer: PPO | Source: Ambulatory Visit | Attending: Internal Medicine | Admitting: Internal Medicine

## 2018-10-17 ENCOUNTER — Encounter (HOSPITAL_COMMUNITY): Payer: PPO

## 2018-10-17 DIAGNOSIS — R079 Chest pain, unspecified: Secondary | ICD-10-CM | POA: Diagnosis not present

## 2018-10-17 DIAGNOSIS — R7989 Other specified abnormal findings of blood chemistry: Secondary | ICD-10-CM

## 2018-10-17 DIAGNOSIS — R0602 Shortness of breath: Secondary | ICD-10-CM

## 2018-10-17 MED ORDER — TECHNETIUM TO 99M ALBUMIN AGGREGATED
1.4000 | Freq: Once | INTRAVENOUS | Status: AC
  Administered 2018-10-17: 1.4 via INTRAVENOUS

## 2018-10-18 ENCOUNTER — Other Ambulatory Visit: Payer: Self-pay

## 2018-10-18 ENCOUNTER — Emergency Department (HOSPITAL_COMMUNITY): Payer: PPO

## 2018-10-18 ENCOUNTER — Telehealth: Payer: Self-pay | Admitting: Internal Medicine

## 2018-10-18 ENCOUNTER — Encounter (HOSPITAL_COMMUNITY): Payer: Self-pay

## 2018-10-18 ENCOUNTER — Other Ambulatory Visit (HOSPITAL_COMMUNITY): Payer: Self-pay

## 2018-10-18 ENCOUNTER — Ambulatory Visit (HOSPITAL_BASED_OUTPATIENT_CLINIC_OR_DEPARTMENT_OTHER)
Admission: RE | Admit: 2018-10-18 | Discharge: 2018-10-18 | Disposition: A | Discharge status: Home / Self Care | Payer: PPO | Source: Ambulatory Visit | Attending: Cardiology | Admitting: Cardiology

## 2018-10-18 ENCOUNTER — Observation Stay (HOSPITAL_COMMUNITY)
Admission: EM | Admit: 2018-10-18 | Discharge: 2018-10-19 | Disposition: A | Discharge status: Home / Self Care | Payer: PPO | Attending: Family Medicine | Admitting: Family Medicine

## 2018-10-18 ENCOUNTER — Encounter (HOSPITAL_COMMUNITY): Payer: Self-pay | Admitting: *Deleted

## 2018-10-18 DIAGNOSIS — R0600 Dyspnea, unspecified: Secondary | ICD-10-CM | POA: Diagnosis not present

## 2018-10-18 DIAGNOSIS — Z951 Presence of aortocoronary bypass graft: Secondary | ICD-10-CM | POA: Insufficient documentation

## 2018-10-18 DIAGNOSIS — I2699 Other pulmonary embolism without acute cor pulmonale: Secondary | ICD-10-CM | POA: Diagnosis not present

## 2018-10-18 DIAGNOSIS — I609 Nontraumatic subarachnoid hemorrhage, unspecified: Secondary | ICD-10-CM | POA: Insufficient documentation

## 2018-10-18 DIAGNOSIS — R0609 Other forms of dyspnea: Secondary | ICD-10-CM

## 2018-10-18 DIAGNOSIS — I208 Other forms of angina pectoris: Secondary | ICD-10-CM

## 2018-10-18 DIAGNOSIS — K219 Gastro-esophageal reflux disease without esophagitis: Secondary | ICD-10-CM | POA: Insufficient documentation

## 2018-10-18 DIAGNOSIS — E119 Type 2 diabetes mellitus without complications: Secondary | ICD-10-CM | POA: Diagnosis present

## 2018-10-18 DIAGNOSIS — R7989 Other specified abnormal findings of blood chemistry: Secondary | ICD-10-CM

## 2018-10-18 DIAGNOSIS — Z794 Long term (current) use of insulin: Secondary | ICD-10-CM | POA: Diagnosis not present

## 2018-10-18 DIAGNOSIS — O2230 Deep phlebothrombosis in pregnancy, unspecified trimester: Secondary | ICD-10-CM | POA: Diagnosis present

## 2018-10-18 DIAGNOSIS — I824Z2 Acute embolism and thrombosis of unspecified deep veins of left distal lower extremity: Secondary | ICD-10-CM | POA: Diagnosis not present

## 2018-10-18 DIAGNOSIS — I629 Nontraumatic intracranial hemorrhage, unspecified: Secondary | ICD-10-CM | POA: Diagnosis not present

## 2018-10-18 DIAGNOSIS — I82452 Acute embolism and thrombosis of left peroneal vein: Principal | ICD-10-CM

## 2018-10-18 DIAGNOSIS — Z86711 Personal history of pulmonary embolism: Secondary | ICD-10-CM | POA: Diagnosis not present

## 2018-10-18 DIAGNOSIS — I200 Unstable angina: Secondary | ICD-10-CM | POA: Diagnosis not present

## 2018-10-18 DIAGNOSIS — I82409 Acute embolism and thrombosis of unspecified deep veins of unspecified lower extremity: Secondary | ICD-10-CM | POA: Diagnosis present

## 2018-10-18 DIAGNOSIS — Z8507 Personal history of malignant neoplasm of pancreas: Secondary | ICD-10-CM | POA: Insufficient documentation

## 2018-10-18 DIAGNOSIS — Z79899 Other long term (current) drug therapy: Secondary | ICD-10-CM | POA: Diagnosis not present

## 2018-10-18 DIAGNOSIS — Z20828 Contact with and (suspected) exposure to other viral communicable diseases: Secondary | ICD-10-CM | POA: Diagnosis not present

## 2018-10-18 DIAGNOSIS — I2510 Atherosclerotic heart disease of native coronary artery without angina pectoris: Secondary | ICD-10-CM | POA: Insufficient documentation

## 2018-10-18 DIAGNOSIS — R079 Chest pain, unspecified: Secondary | ICD-10-CM

## 2018-10-18 LAB — TROPONIN I (HIGH SENSITIVITY)
Troponin I (High Sensitivity): 10 ng/L (ref <18)
Troponin I (High Sensitivity): 9 ng/L (ref <18)

## 2018-10-18 LAB — COMPREHENSIVE METABOLIC PANEL
ALT: 23 U/L (ref 0–44)
AST: 19 U/L (ref 15–41)
Albumin: 3.6 g/dL (ref 3.5–5.0)
Alkaline Phosphatase: 72 U/L (ref 38–126)
Anion gap: 10 (ref 5–15)
BUN: 18 mg/dL (ref 8–23)
CO2: 29 mmol/L (ref 22–32)
Calcium: 9.5 mg/dL (ref 8.9–10.3)
Chloride: 101 mmol/L (ref 98–111)
Creatinine, Ser: 0.78 mg/dL (ref 0.61–1.24)
GFR calc Af Amer: >60 mL/min (ref >60)
GFR calc non Af Amer: >60 mL/min (ref >60)
Glucose, Bld: 153 mg/dL — ABNORMAL HIGH (ref 70–99)
Potassium: 3.7 mmol/L (ref 3.5–5.1)
Sodium: 140 mmol/L (ref 135–145)
Total Bilirubin: 0.7 mg/dL (ref 0.3–1.2)
Total Protein: 7.4 g/dL (ref 6.5–8.1)

## 2018-10-18 LAB — CBC WITH DIFFERENTIAL/PLATELET
Abs Immature Granulocytes: 0.02 10*3/uL (ref 0.00–0.07)
Basophils Absolute: 0.1 10*3/uL (ref 0.0–0.1)
Basophils Relative: 1 %
Eosinophils Absolute: 0.2 10*3/uL (ref 0.0–0.5)
Eosinophils Relative: 2 %
HCT: 41.8 % (ref 39.0–52.0)
Hemoglobin: 14.1 g/dL (ref 13.0–17.0)
Immature Granulocytes: 0 %
Lymphocytes Relative: 25 %
Lymphs Abs: 2.6 10*3/uL (ref 0.7–4.0)
MCH: 31.7 pg (ref 26.0–34.0)
MCHC: 33.7 g/dL (ref 30.0–36.0)
MCV: 93.9 fL (ref 80.0–100.0)
Monocytes Absolute: 1.1 10*3/uL — ABNORMAL HIGH (ref 0.1–1.0)
Monocytes Relative: 10 %
Neutro Abs: 6.6 10*3/uL (ref 1.7–7.7)
Neutrophils Relative %: 62 %
Platelets: 306 10*3/uL (ref 150–400)
RBC: 4.45 MIL/uL (ref 4.22–5.81)
RDW: 12.6 % (ref 11.5–15.5)
WBC: 10.6 10*3/uL — ABNORMAL HIGH (ref 4.0–10.5)
nRBC: 0 % (ref 0.0–0.2)

## 2018-10-18 LAB — CBG MONITORING, ED: Glucose-Capillary: 109 mg/dL — ABNORMAL HIGH (ref 70–99)

## 2018-10-18 LAB — APTT: aPTT: 25 seconds (ref 24–36)

## 2018-10-18 LAB — GLUCOSE, CAPILLARY: Glucose-Capillary: 170 mg/dL — ABNORMAL HIGH (ref 70–99)

## 2018-10-18 LAB — PROTIME-INR
INR: 1 (ref 0.8–1.2)
Prothrombin Time: 13.5 seconds (ref 11.4–15.2)

## 2018-10-18 LAB — SARS CORONAVIRUS 2 BY RT PCR (HOSPITAL ORDER, PERFORMED IN ~~LOC~~ HOSPITAL LAB): SARS Coronavirus 2: NEGATIVE

## 2018-10-18 MED ORDER — ATORVASTATIN CALCIUM 10 MG PO TABS
20.0000 mg | ORAL_TABLET | Freq: Every day | ORAL | Status: DC
  Administered 2018-10-19: 20 mg via ORAL
  Filled 2018-10-18: qty 2

## 2018-10-18 MED ORDER — ISOSORBIDE MONONITRATE ER 30 MG PO TB24
30.0000 mg | ORAL_TABLET | Freq: Every day | ORAL | Status: DC
  Administered 2018-10-19: 30 mg via ORAL
  Filled 2018-10-18: qty 1

## 2018-10-18 MED ORDER — PANTOPRAZOLE SODIUM 40 MG PO TBEC
40.0000 mg | DELAYED_RELEASE_TABLET | Freq: Every day | ORAL | Status: DC
  Administered 2018-10-19: 40 mg via ORAL
  Filled 2018-10-18: qty 1

## 2018-10-18 MED ORDER — HEPARIN (PORCINE) 25000 UT/250ML-% IV SOLN
1200.0000 [IU]/h | INTRAVENOUS | Status: DC
  Administered 2018-10-18: 1200 [IU]/h via INTRAVENOUS
  Filled 2018-10-18: qty 250

## 2018-10-18 MED ORDER — LISINOPRIL 20 MG PO TABS
20.0000 mg | ORAL_TABLET | Freq: Every day | ORAL | Status: DC
  Administered 2018-10-19: 20 mg via ORAL
  Filled 2018-10-18: qty 1

## 2018-10-18 MED ORDER — INSULIN ASPART 100 UNIT/ML ~~LOC~~ SOLN
0.0000 [IU] | Freq: Three times a day (TID) | SUBCUTANEOUS | Status: DC
  Administered 2018-10-19: 7 [IU] via SUBCUTANEOUS
  Administered 2018-10-19: 1 [IU] via SUBCUTANEOUS

## 2018-10-18 MED ORDER — INSULIN GLARGINE 100 UNIT/ML ~~LOC~~ SOLN
9.0000 [IU] | Freq: Every day | SUBCUTANEOUS | Status: DC
  Administered 2018-10-19: 9 [IU] via SUBCUTANEOUS
  Filled 2018-10-18 (×2): qty 0.09

## 2018-10-18 MED ORDER — ACETAMINOPHEN 650 MG RE SUPP: 650.0000 mg | Freq: Four times a day (QID) | RECTAL | Status: DC | PRN

## 2018-10-18 MED ORDER — AMLODIPINE BESYLATE 10 MG PO TABS
10.0000 mg | ORAL_TABLET | Freq: Every day | ORAL | Status: DC
  Administered 2018-10-19: 10 mg via ORAL
  Filled 2018-10-18: qty 1

## 2018-10-18 MED ORDER — HEPARIN BOLUS VIA INFUSION
2500.0000 [IU] | Freq: Once | INTRAVENOUS | Status: AC
  Administered 2018-10-18: 2500 [IU] via INTRAVENOUS
  Filled 2018-10-18: qty 2500

## 2018-10-18 MED ORDER — TAMSULOSIN HCL 0.4 MG PO CAPS: 0.4000 mg | ORAL_CAPSULE | Freq: Every day | ORAL | Status: DC

## 2018-10-18 MED ORDER — MELATONIN 3 MG PO TABS
3.0000 mg | ORAL_TABLET | Freq: Every day | ORAL | Status: DC
  Administered 2018-10-19: 3 mg via ORAL
  Filled 2018-10-18 (×2): qty 1

## 2018-10-18 MED ORDER — ACETAMINOPHEN 325 MG PO TABS: 650.0000 mg | ORAL_TABLET | Freq: Four times a day (QID) | ORAL | Status: DC | PRN

## 2018-10-18 MED ORDER — POLYETHYLENE GLYCOL 3350 17 G PO PACK: 17.0000 g | PACK | Freq: Every day | ORAL | Status: DC | PRN

## 2018-10-18 NOTE — ED Provider Notes (Signed)
[facility] EMERGENCY DEPARTMENT Provider Note   CSN: [id number] Arrival date & time: 10/18/18  1417     History   Chief Complaint No chief complaint on file.   HPI Ricardo Le is a 78 y.o. male with a past medical history of CAD, status post CABG x4, status post intraventricular hemorrhage bilaterally with small amount of subarachnoid hemorrhage on 07/20/2018, hard of hearing, DM, hypertension, hyperlipidemia, peripheral arterial disease, who presents today for evaluation of abnormal labs and imaging.  He reports that he has been feeling short of breath over the past few months that is been gradually worsening.  He was seen and evaluated by pulmonology 2 days ago and as part of their evaluation obtained a d-dimer.  D-dimer was slightly elevated.  DVT study showed a left-sided acute DVT in the left peroneal veins.  As he is allergic to IV contrast a VQ scan was obtained as an outpatient, this showed several small peripheral perfusion defects primarily in the left lower lobe suggestive of small distal pulmonary emboli.  He reports that occasionally he has pain in his left calf however denies any swelling.  He also denies any fevers.  He has not had any chest pain in the past 3 to 4 weeks.  He denies any nausea vomiting or diarrhea.    HPI  Past Medical History:  Diagnosis Date   CAD (coronary artery disease)    a. 04/2007 s/p CABG x 4  - LIMA->LAD, VG->OM1->Om2, VG->PDA;  b. 12/2008 & 06/2010 Caths - Native multivessel dzs with 4/4 patent grafts. c. cath 03/09/2015 patent grafts, unchanged from prior cath.   Cancer [facility])    pancreatic  March 2011   Carotid bruit    a. 07/2010 U/S- 0-39% bilat ICA stenosis   Chest pain    Noncardiac probably related to reflux   DDD (degenerative disc disease)    several surgeries   Fracture acetabulum-closed ([place]) 07/08/2018   GERD (gastroesophageal reflux disease)    HNP (herniated nucleus pulposus), lumbar    a. L2-3, s/p  laminotomy, microdiskectomy 02/2009   HOH (hard of hearing)    uses amplifiers    Nephrolithiasis    PAD (peripheral artery disease) ([place]) 04/2014   s/p L Common Iliac Stent   Palpitations    Pancreatic mass    a. 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   Pneumonia    hx of  pna   Pure hypercholesterolemia    statin intolerance   Type II or unspecified type diabetes mellitus without mention of complication, not stated as uncontrolled    insulin dependent   Unspecified essential hypertension     Patient Active Problem List   Diagnosis Date Noted   DOE (dyspnea on exertion) 10/16/2018   Traumatic intraventricular hemorrhage ([place]) 07/23/2018   Intraventricular hemorrhage ([place]) 07/20/2018   Fracture of left acetabulum ([place]) 07/09/2018   Elevated troponin    Coronary artery disease involving native coronary artery of native heart with unstable angina pectoris ([place])    Chest pain 02/20/2017   Precordial chest pain    Angina pectoris ([place]) 02/19/2017   Atherosclerotic heart disease of native coronary artery with unstable angina pectoris ([place]) 03/08/2015   Hyperlipidemia 01/26/2015   Cervical spondylosis without myelopathy 07/28/2014   Cervical spondylosis 07/28/2014   Constipation 05/19/2014   Pain 05/18/2014   PAD (peripheral artery disease) ([place]) 03/20/2012   Unstable angina ([place]) 05/05/2011   DDD (degenerative disc disease)    HNP (  herniated nucleus pulposus), lumbar    Pancreatic mass    Nephrolithiasis    Carotid bruit    S/P CABG x 4 03/08/2011   Bruit 07/06/2010   NONSPECIFIC ABN FINDING RAD & OTH EXAM GI TRACT 06/09/2009   NAUSEA ALONE 05/28/2009   CHANGE IN BOWELS 05/28/2009   Elevated lipids 05/27/2009   Abdominal pain, generalized 05/27/2009   Type 2 diabetes mellitus with vascular disease ([place]) 12/16/2008   Essential hypertension 12/16/2008   BACK PAIN 12/16/2008   Palpitations 12/16/2008    Past  Surgical History:  Procedure Laterality Date   ABDOMINAL AORTAGRAM N/A 05/13/2014   Procedure: ABDOMINAL [name];  Surgeon: Wellington Hampshire, MD;  Location: [facility] CATH LAB;  Service: Cardiovascular;  Laterality: N/A;   ANTERIOR CERVICAL DECOMP/DISCECTOMY FUSION N/A 07/28/2014   Procedure: ANTERIOR CERVICAL DECOMPRESSION/DISCECTOMY FUSION CERVICAL FOUR-FIVE CERVICAL FIVE-SIX ;  Surgeon: Earnie Larsson, MD;  Location: [facility] NEURO ORS;  Service: Neurosurgery;  Laterality: N/A;   APPENDECTOMY     arthroscopy right knee     BACK SURGERY     CARDIAC CATHETERIZATION N/A 03/09/2015   Procedure: Left Heart Cath and Cors/Grafts Angiography;  Surgeon: [name] Sine, MD;  Location: [facility] CV LAB;  Service: Cardiovascular;  Laterality: N/A;   CHOLECYSTECTOMY     CORONARY ARTERY BYPASS GRAFT     X 4 2009 (LIMA to LAD, SVG to first cicumflex marginal branch with sequential SVG to second cicumflex marginal branch  and saphenous vein to posterior descending coronary artery with endoscopic,vein harvest rt. lower exttremity by Dr.Owen on March 31,2009   CYST REMOVED     FROM SPINE   CYSTOURETHROSCOPY     right retrograde pyelogram,manipulate stone in the renal pelvis, rt. double-j catheter.   EYE SURGERY     KNEE SURGERY     rt knee   LEFT HEART CATH AND CORS/GRAFTS ANGIOGRAPHY N/A 02/20/2017   Procedure: LEFT HEART CATH AND CORS/GRAFTS ANGIOGRAPHY;  Surgeon: Leonie Man, MD;  Location: [facility] CV LAB;  Service: Cardiovascular;  Laterality: N/A;   LEFT HEART CATH AND CORS/GRAFTS ANGIOGRAPHY N/A 05/28/2018   Procedure: LEFT HEART CATH AND CORS/GRAFTS ANGIOGRAPHY;  Surgeon: Burnell Blanks, MD;  Location: [facility] CV LAB;  Service: Cardiovascular;  Laterality: N/A;   NEPHROLITHOTOMY     PERCUTANEOUS   PARS PLANA VITRECTOMY     lt. eye,retinal photocoagulation lt. eye, membrane peel lt. eye.   PERCUTANEOUS STENT INTERVENTION Left 05/13/2014   Procedure: PERCUTANEOUS STENT  INTERVENTION;  Surgeon: Wellington Hampshire, MD;  Location: [facility] CATH LAB;  Service: Cardiovascular;  Laterality: Left;  COMMON ILLIAC   re-exploratin of laminectomy  05/2008   (RT L2-3) WITH REDO MICRODISKECTOMY        Home Medications    Prior to Admission medications   Medication Sig Start Date End Date Taking? Authorizing Provider  acetaminophen (TYLENOL) 325 MG tablet Take 650 mg by mouth every 6 (six) hours as needed (for pain).    Yes [provider]  amLODipine (NORVASC) 10 MG tablet Take 1 tablet (10 mg total) by mouth daily. 07/30/18  Yes Angiulli, Lavon Paganini, PA-C  atorvastatin (LIPITOR) 20 MG tablet Take 1 tablet (20 mg total) by mouth at bedtime. 07/30/18  Yes Angiulli, Lavon Paganini, PA-C  bisacodyl (DULCOLAX) 5 MG EC tablet Take 5 mg by mouth every 3 (three) days.    Yes [provider]  docusate sodium (COLACE) 100 MG capsule Take 1 capsule (100 mg total) by mouth 2 (two)  times daily. Patient taking differently: Take 100 mg by mouth every 3 (three) days.  07/11/18  Yes Marylyn Ishihara, Tyrone A, DO  esomeprazole (NEXIUM) 40 MG capsule Take 1 capsule (40 mg total) by mouth 2 (two) times daily before a meal. 07/30/18  Yes Angiulli, Lavon Paganini, PA-C  glipiZIDE (GLUCOTROL) 5 MG tablet Take 0.5 tablets (2.5 mg total) by mouth 2 (two) times daily before a meal. 07/30/18  Yes Angiulli, Lavon Paganini, PA-C  Insulin Glargine (LANTUS) 100 UNIT/ML Solostar Pen Inject 18 Units into the skin at bedtime. 07/30/18  Yes Angiulli, Lavon Paganini, PA-C  lisinopril (ZESTRIL) 20 MG tablet Take 20 mg by mouth daily.   Yes [provider]  Melatonin 3 MG TABS Take 1 tablet (3 mg total) by mouth at bedtime. 07/30/18  Yes Angiulli, Lavon Paganini, PA-C  nitroGLYCERIN (NITROSTAT) 0.4 MG SL tablet Place 1 tablet (0.4 mg total) under the tongue every 5 (five) minutes as needed for chest pain. 05/02/18 05/01/19 Yes Josue Hector, MD  senna (SENOKOT) 8.6 MG TABS tablet Take 1 tablet (8.6 mg total) by mouth at bedtime as needed  for mild constipation. 07/23/18  Yes Vann, Jessica U, DO  sucralfate (CARAFATE) 1 GM/10ML suspension Take 10 mLs (1 g total) by mouth 4 (four) times daily. 07/30/18  Yes Angiulli, Lavon Paganini, PA-C  tamsulosin (FLOMAX) 0.4 MG CAPS capsule Take 1 capsule (0.4 mg total) by mouth daily after supper. 07/30/18  Yes Angiulli, Lavon Paganini, PA-C  isosorbide mononitrate (IMDUR) 30 MG 24 hr tablet Take 1 tablet (30 mg total) by mouth daily. 10/01/18 12/30/18  Wellington Hampshire, MD  Multiple Vitamin (MULTIVITAMIN WITH MINERALS) TABS tablet Take 1 tablet by mouth daily.    [provider]  telmisartan (MICARDIS) 80 MG tablet Take 1 tablet (80 mg total) by mouth daily. 10/16/18   Tanda Rockers, MD    Family History Family History  Problem Relation Age of Onset   Heart attack Father        died of MI @ 63   Diabetes Brother        type 2   Other Mother        died @ 13   Coronary artery disease Sister        alive   Other Other        no fh of colon cancer   Coronary artery disease Brother        s/p CABG.  Alive @ 62    Social History Social History   Tobacco Use   Smoking status: Never Smoker   Smokeless tobacco: Never Used  Substance Use Topics   Alcohol use: No   Drug use: No     Allergies   Oxycodone hcl, Ranexa [ranolazine], Rosuvastatin calcium, Tramadol, Iohexol, and Statins   Review of Systems Review of Systems  Constitutional: Negative for chills, fatigue and fever.  Respiratory: Positive for shortness of breath (On exertion). Negative for chest tightness.   Cardiovascular: Negative for chest pain, palpitations and leg swelling.  Gastrointestinal: Negative for abdominal pain.  Neurological: Negative for weakness and headaches.  All other systems reviewed and are negative.    Physical Exam Updated Vital Signs BP (!) 147/63    Pulse (!) 58    Temp 98.8 F (37.1 C) (Oral)    Resp 20    Ht 5\' 2"  (1.575 m)    Wt 68.5 kg    SpO2 97%    BMI 27.62 kg/m   Physical  Exam Vitals signs and nursing note reviewed.  Constitutional:      Appearance: He is well-developed. He is not ill-appearing.  HENT:     Head: Normocephalic and atraumatic.  Eyes:     Conjunctiva/sclera: Conjunctivae normal.  Neck:     Musculoskeletal: Neck supple.  Cardiovascular:     Rate and Rhythm: Normal rate and regular rhythm.     Heart sounds: Murmur present.  Pulmonary:     Effort: Pulmonary effort is normal. No respiratory distress.     Breath sounds: Normal breath sounds. No stridor.  Abdominal:     Palpations: Abdomen is soft.     Tenderness: There is no abdominal tenderness.  Musculoskeletal:     Right lower leg: No edema.     Left lower leg: No edema.  Skin:    General: Skin is warm and dry.  Neurological:     General: No focal deficit present.     Mental Status: He is alert.     Cranial Nerves: No cranial nerve deficit.     Comments: Patient is very hard of hearing  Psychiatric:        Mood and Affect: Mood normal.        Behavior: Behavior normal.      ED Treatments / Results  Labs (all labs ordered are listed, but only abnormal results are displayed) Labs Reviewed  CBC WITH DIFFERENTIAL/PLATELET - Abnormal; Notable for the following components:      Result Value   WBC 10.6 (*)    Monocytes Absolute 1.1 (*)    All other components within normal limits  COMPREHENSIVE METABOLIC PANEL - Abnormal; Notable for the following components:   Glucose, Bld 153 (*)    All other components within normal limits  CBG MONITORING, ED - Abnormal; Notable for the following components:   Glucose-Capillary 109 (*)    All other components within normal limits  SARS CORONAVIRUS 2 (HOSPITAL ORDER, [place] LAB)  PROTIME-INR  APTT  HEPARIN LEVEL (UNFRACTIONATED)  CBC  TROPONIN I (HIGH SENSITIVITY)  TROPONIN I (HIGH SENSITIVITY)    EKG None  Radiology- obtained PTA.  Ct Head Wo Contrast  Result Date: 10/18/2018 CLINICAL DATA:   Intracranial hemorrhage, known, follow up. Patient now has pulmonary embolus/DVT. Scan to re-evaluate bleeding prior to determining anticoagulation. EXAM: CT HEAD WITHOUT CONTRAST TECHNIQUE: Contiguous axial images were obtained from the base of the skull through the vertex without intravenous contrast. COMPARISON:  07/23/2018 and earlier FINDINGS: Brain: There is central and cortical atrophy. Periventricular white matter changes are consistent with small vessel disease. There is no intra or extra-axial fluid collection or mass lesion. The basilar cisterns and ventricles have a normal appearance. There is no CT evidence for acute infarction or hemorrhage. Layering blood in the dependent aspects of the LATERAL ventricles has resolved since the prior studies. Vascular: There is atherosclerotic calcifications of the internal carotid arteries. No hyperdense vessels. Skull: Normal. Negative for fracture or focal lesion. Sinuses/Orbits: There is mucoperiosteal thickening of the paranasal sinuses. Air-fluid level or mucosal thickening are within the RIGHT sphenoid air cell, stable in appearance. No mastoid effusions. Other: None. IMPRESSION: 1. Resolved layering blood products within the LATERAL ventricles. 2. No evidence for acute intracranial abnormality. 3. Atrophy and small vessel disease. 4. Chronic sinusitis. Electronically Signed   By: Nolon Nations M.D.   On: 10/18/2018 19:34   Nm Pulmonary Perfusion  Addendum Date: 10/17/2018   ADDENDUM REPORT: 10/17/2018 12:32 ADDENDUM: Findings conveyed toMICHAEL  WERT on 10/17/2018  at12:15. Electronically Signed   By: Suzy Bouchard M.D.   On: 10/17/2018 12:32   Result Date: 10/17/2018 CLINICAL DATA:  Short of breath. COPD, asthma. Chest pain on exertion EXAM: NUCLEAR MEDICINE PERFUSION LUNG SCAN TECHNIQUE: Perfusion images were obtained in multiple projections after intravenous injection of radiopharmaceutical. RADIOPHARMACEUTICALS:  1.4 mCi Tc-33m MAA COMPARISON:   Radiograph 10/16/2018, CT PA 07/09/2018. FINDINGS: There several very small peripheral perfusion defects within the LEFT lower lobe (approximately 3). Potential 1 small defect in the RIGHT lower lobe. No clear corresponding findings on comparison radiograph. There is chronic elevation of the RIGHT hemidiaphragm on the radiograph. No pulmonary emboli identified on the comparison CTA 07/09/2018 IMPRESSION: Several small peripheral perfusion defects primary in the LEFT lower lobe suggest small distal pulmonary emboli. Findings could represent chronic or acute pulmonary emboli. Electronically Signed: By: Suzy Bouchard M.D. On: 10/17/2018 12:00   Vas [place] Lower Extremity Venous (dvt)  Result Date: 10/18/2018  Lower Venous Study Indications: Pain, SOB, and D Dimer. Patient complains of shortness of breath for the past two weeks. He has some tenderness in the left calf. Other Indications: Abnormal VQ scan.  Performing Technologist: Wilkie Aye RVT  Examination Guidelines: A complete evaluation includes B-mode imaging, spectral Doppler, color Doppler, and power Doppler as needed of all accessible portions of each vessel. Bilateral testing is considered an integral part of a complete examination. Limited examinations for reoccurring indications may be performed as noted.  +---------+---------------+---------+-----------+----------+--------------+  RIGHT     Compressibility Phasicity Spontaneity Properties Thrombus Aging  +---------+---------------+---------+-----------+----------+--------------+  CFV       Full            Yes       Yes                                    +---------+---------------+---------+-----------+----------+--------------+  SFJ       Full            Yes       Yes                                    +---------+---------------+---------+-----------+----------+--------------+  FV Prox   Full            Yes       Yes                                     +---------+---------------+---------+-----------+----------+--------------+  FV Mid    Full            Yes       Yes                                    +---------+---------------+---------+-----------+----------+--------------+  FV Distal Full            Yes       Yes                                    +---------+---------------+---------+-----------+----------+--------------+  PFV       Full                                                             +---------+---------------+---------+-----------+----------+--------------+  POP       Full            Yes       Yes                                    +---------+---------------+---------+-----------+----------+--------------+  PTV       Full            Yes       Yes                                    +---------+---------------+---------+-----------+----------+--------------+  PERO      Full            Yes       Yes                                    +---------+---------------+---------+-----------+----------+--------------+  Gastroc   Full                                                             +---------+---------------+---------+-----------+----------+--------------+  +---------+---------------+---------+-----------+----------+--------------+  LEFT      Compressibility Phasicity Spontaneity Properties Thrombus Aging  +---------+---------------+---------+-----------+----------+--------------+  CFV       Full            Yes       Yes                                    +---------+---------------+---------+-----------+----------+--------------+  SFJ       Full            Yes       Yes                                    +---------+---------------+---------+-----------+----------+--------------+  FV Prox   Full            Yes       Yes                                    +---------+---------------+---------+-----------+----------+--------------+  FV Mid    Full            Yes       Yes                                     +---------+---------------+---------+-----------+----------+--------------+  FV Distal Full            Yes       Yes                                    +---------+---------------+---------+-----------+----------+--------------+  PFV       Full                                                             +---------+---------------+---------+-----------+----------+--------------+  POP       Full            Yes       Yes                                    +---------+---------------+---------+-----------+----------+--------------+  PTV       Full            Yes       Yes                                    +---------+---------------+---------+-----------+----------+--------------+  PERO      None            No        No          dilated    Acute           +---------+---------------+---------+-----------+----------+--------------+  Gastroc   Full                                                             +---------+---------------+---------+-----------+----------+--------------+  GSV       Full            Yes       Yes                                    +---------+---------------+---------+-----------+----------+--------------+  Findings reported to Dr Melvyn Novas at 1:30 pm. Patient instructed to go to [facility] emergency room for treatment.  Summary: Right: No evidence of deep vein thrombosis in the lower extremity. No indirect evidence of obstruction proximal to the inguinal ligament. No cystic structure found in the popliteal fossa. Left: Findings consistent with acute deep vein thrombosis involving the left peroneal veins. No cystic structure found in the popliteal fossa. All other veins visualized appear fully compressible and demonstrate appropriate Doppler characteristics.  *See table(s) above for measurements and observations. Electronically signed by Larae Grooms MD on 10/18/2018 at 6:11:51 PM.    Final     Procedures .Critical Care Performed by: Lorin Glass, PA-C Authorized by: Lorin Glass,  PA-C   Critical care provider statement:    Critical care time (minutes):  45   Critical care was time spent personally by me on the following activities:  Discussions with consultants, evaluation of patient's response to treatment, examination of patient, ordering and performing treatments and interventions, ordering and review of laboratory studies, ordering and review of radiographic studies, pulse oximetry, re-evaluation of patient's condition, obtaining history from patient or surrogate and review of old charts   (including critical care time)  Medications Ordered in ED Medications  heparin bolus via infusion 2,500 Units (has no administration in time range)  heparin ADULT infusion 100 units/mL (25000 units/221mL sodium chloride 0.45%) (has no administration in time range)     Initial Impression / Assessment and Plan / ED Course  I have reviewed the triage vital signs and the nursing notes.  Pertinent labs & imaging results that were available during my care of the patient were reviewed by me  and considered in my medical decision making (see chart for details).  Clinical Course as of Oct 18 2111  Fri Oct 18, 2018  1732 Spoke with Joelene Millin from neurosurgery, she requests that we get a repeat CT scan of his head and then call her back to discuss anticoagulation.  Green with Joelene Millin from neurosurgery, she will review images.     [EH]  1954 I spoke with Glenford Peers NP with [place] [facility] who states that it is okay to anticoagulate patient, no restrictions on anticoagulation and he may take heparin, NOAC or other anticoagulants as indicated.  Verbally repeated instructions and conformed by Joelene Millin NP.    [EH]    Clinical Course User Index [EH] Lorin Glass, PA-C      Patient presents today for evaluation after being found to have a DVT and a VQ scan concerning for PE.  He is hemodynamically stable.  Chart review shows that he is a  left-sided DVT and abnormal VQ scan.  He also had a traumatic intracranial hemorrhage on 07/20/2018 after a fall.  I spoke with neurosurgery NP Glenford Peers who recommended a CT head.  CT head shows that previous bleeding appears to have fully resolved.  NP Meyran states that his previous hemorrhagic stroke is not a current contraindication for anticoagulation and that it is okay to anticoagulate him with heparin, NOAC or other anticoagulants from neurosurgical standpoint.  Troponin is not elevated.  Labs are obtained and reviewed, he does not have any significant hematologic or electrolyte derangements.  I spoke with [facility] residents who will see patient for admission.  Covid test is negative.    [place] L Demauro was evaluated in Emergency Department on 10/18/2018 for the symptoms described in the history of present illness. He was evaluated in the context of the global COVID-19 pandemic, which necessitated consideration that the patient might be at risk for infection with the SARS-CoV-2 virus that causes COVID-19. Institutional protocols and algorithms that pertain to the evaluation of patients at risk for COVID-19 are in a state of rapid change based on information released by regulatory bodies including the CDC and federal and state organizations. These policies and algorithms were followed during the patient's care in the ED.  This patient was seen as a shared visit with Dr. Regenia Skeeter.    Final Clinical Impressions(s) / ED Diagnoses   Final diagnoses:  Pulmonary embolism, unspecified chronicity, unspecified pulmonary embolism type, unspecified whether acute cor pulmonale present ([place])  Acute deep vein thrombosis (DVT) of left peroneal vein  DOE (dyspnea on exertion)    ED Discharge Orders    None       Ollen Gross 10/18/18 2114    Sherwood Gambler, MD 10/18/18 2222

## 2018-10-18 NOTE — ED Triage Notes (Signed)
Pt sent here by pcp for # DVT results to LLE as well as an abnormal VQ scan.  Pt appears in no distress.

## 2018-10-18 NOTE — Telephone Encounter (Signed)
Patient wife called and states that her husband is at the ER at [facility] and the staff is telling him to go home because they don't have orders and don't know what to do with him.   Call made to ER triage, spoke with Hassan Rowan. Made aware the patient tested positive for DVT and NP is wanting patient evaluated and started on blood thinners, ekg, whatever work up is necessary and admitted if MD thinks it is necessary. Voiced understanding. Nothing further needed at this time.

## 2018-10-18 NOTE — Telephone Encounter (Signed)
Will wait for formal results from lower extremity Dopplers.  Acute DVT seen per phone call today.  Patient also with abnormal VQ scan.  Will route patient to the emergency room.  Results will be formally in epic in about 30 minutes.  Will chart on those results as well.  Please make sure patient is also scheduled for a close follow-up with Dr. Melvyn Novas sometime next Sharyne Peach, FNP

## 2018-10-18 NOTE — ED Notes (Signed)
Admitting at bedside 

## 2018-10-18 NOTE — Telephone Encounter (Signed)
Call transferred at the request of app of the day, Wyn Quaker, NP to give appropriate recommendations for the patient based on test results.  Message routed to Wyn Quaker, NP to notate his recommendations as Dr. Melvyn Novas is out of the office this afternoon.

## 2018-10-18 NOTE — H&P (Signed)
[facility] Admission History and Physical Service Pager: [phone]  Patient name: Ricardo Le Medical record number: [id number] Date of birth: [birth year] Age: 78 y.o. Gender: male  Primary Care Provider: Angelina Sheriff, MD Consultants: none Code Status: Full code Preferred Emergency Contact: Amjad Zupan [phone]  Chief Complaint: PE and DVT  Assessment and Plan: Ricardo Le is a 78 y.o. male presenting with weakness and abnormal labs and imgaing secondary to a confirmed PE and DVT. PMH is significant for recent subarachnoid hemorrage w/ head injury and fall, unstable angina, PAD, CABG x4 T2DM, Nephrolithiasis, HLD, Consitpation, Pancreatic cancer, DDD, GERD.   PE with DVT He was evaluated by pulmonology 2 days ago due to dyspnea on exertion, orthopnea, and intermittent left leg pain. An elevated D-dimer resulted at 1.30 on 9/2. A DVT study found a left peroneal vein thrombus. VQ scan was obtained as an outpatient due to IV contrast allergy, which showed several small peripheral perfusion defects primarily in the left lower lobe suggestive of small distal pulmonary emboli. In the ED, neurosurgery NP Glenford Peers was consulted given that patient had previous Murphy on 07/20/2018 and recommended a CT head.  CT head shows that previous bleeding appears to have fully resolved.  NP Meyran told ED provider, previous hemorrhagic stroke is not a current contraindication for anticoagulation and that it is okay to anticoagulate him. Troponin is not elevated at 10, 9. CMP, CBC, PT, INR, PTT were obtained and reviewed, he does not have any significant hematologic or electrolyte derangements. COVID negative. During my visit he denied shortness of breath but endorsed shortness of breath with ED provider. He denies headache, acute vision changes described as film after SAH, chest pain, chest tightness, and leg pain. He does have some abdominal discomfort most likely  due to his chronic constipation. Last bowel movement was yesterday and has active bowel sounds.  -Admit to telemetry, Attending Dr. Andria Frames -Continue Heparin -Switch to [place] therapy in the am -CXR: Right lower lobe atelectasis versus less likely airspace Consolidation. 09/04. -VQ scan: Several small peripheral perfusion defects primary in the LLL suggest small distal pulmonary emboli. Findings could represent chronic or acute pulmonary emboli. 09/03. -CT head: Resolved layering blood products within the LATERAL ventricles. No evidence for acute intracranial abnormality. Atrophy and small vessel disease. Chronic sinusitis. 09/02. -CBC, CMP am - PT/OT eval and treat  Subarachnoid hemorrage 2/2 fall with head injury 07/20/2018 Presented 07/20/2018 with headache and altered mental status. No additional falls noted since most last event 07/08/2018. Cranial CT scan showed intraventricular hemorrhage involving bilateral lateral ventricles, left side greater than right. There was also concern for small amount of subarachnoid blood in the left frontal lobe. Neurosurgery Dr. Sherley Bounds consulted with conservative care with follow-up cranial CT scan unchanged no new site of hemorrhage. No hydrocephalus and no mass-effect. Patient had been on aspirin prior to admission remained on hold due to hemorrhage. Cranial CT scan completed 07/23/2018 showing no newacute changes.  -per neurosurgery, ok to anticoagulate -Refer to new imaging above and below   Fracture acetabulum-closed 2/2 to recent fall Patient with recent admission 07/08/2018 to 07/11/2018 after recent fall sustaining a left acetabular fracture that was nonoperative followed by Dr. Edmonia Lynch orthopedic services [place] weight[facility]. -PT/OT to eval and treat  HOH 2/2 to recent fall Noted gradual hearing loss post fall (07/08/2018) recommendations have been made for ENT follow-up as outpatient. Lt. Ear is noted to better than the  right.  Chest  pain 2/2 unstable angina. CABG x4 2013 Does not endorse chest pain on admission. Home meds are Imdur 30mg  and Nitroglycerin 0.4mg  PRN -Continue Imdur -Troponin 10>9 here with no current chest pain -Monitor for new chest pain  GERD  Home meds is Nexium 40mg  BID -Start Protonix 40mg  daily as is on formulary  Nephrolithiasis Denies dysuria and frequency on admission. Home med is Tamsulosin 0.4mg   -Continue tamsulosin -Cr on admission 0.78, WBC 10.6 -CMP, CBC am  HLD Last lipid panel 02/21/2017 Tot chol 138, HDL 43, LDL 86. Hx of statin intolerance. Home med is atorvastatin. -Continue atorvastatin -Lipid panel am  T2DM Insulin dependent. A1c 7.2 a few weeks ago per patient. Home meds are Glipizide 2.5 mg BID and Lantus 18 units HS. CBG on admission 109. -Hold glipizide, discontinued during last admission -Start Lantus 9 units -SSSI -CBG monitoring QID, AC & HS  HTN  BP on admission SBP 116-155, DBP 63-122. Home meds are Amlodipine 10mg  and Lisinopril 20mg  -Continue amlodipine and lisinopril  Constipation Last bowel movement was yesterday. On admission he is endorsing general abdominal pain with no guarding or tenderness. NABS present in all 4 quadranst. Home meds are Dulcolax 5mg  TID, Colace 100mg  BID, Senokot 8.6mg , and Carafate. -Hold home meds -Start Miralax daily PRN, titrate as needed  PAD  Most recent vascular studies in October 2019 showed patent left common iliac artery stent.  ABI was 0.7 on the right and 0.84 on the left. 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 male presenting with PE and DVT.  Patient reports he was seen by his pulmonologist 2 days ago and they told him to come in for tests. He reports he was having some pain in his lower left leg, but he is currently not having now. He reports it felt like cramps. He reports he had a stent placed in his left leg before. He denies any shortness of breath. He reports that he has some weakness, especially with exercise. He saw his cardiologist this week who told him to continue to exercise even though he was having chest pain. He reports he only has chest pain when he walks.   Review Of Systems: Per HPI with the following additions:  Review of Systems  Constitutional: Positive for malaise/fatigue.  Eyes: Negative for blurred vision and double vision.       Has had film since he had SAH  Respiratory: Negative for cough and shortness of breath.   Cardiovascular: Positive for chest pain and claudication.       Chest pain with exertion, none currently  Gastrointestinal: Positive for abdominal pain. Negative for constipation and diarrhea.  Genitourinary: Negative for dysuria, frequency and urgency.  Musculoskeletal: Negative for falls.  Neurological: Negative for dizziness and headaches.    Patient Active Problem List   Diagnosis Date Noted  . DOE (dyspnea on exertion) 10/16/2018  . Traumatic intraventricular hemorrhage ([place]) 07/23/2018  . Intraventricular hemorrhage ([place]) 07/20/2018  . Fracture of left acetabulum ([place]) 07/09/2018  . Elevated troponin   . Coronary artery disease involving native coronary artery of native heart with unstable angina pectoris ([place])   . Chest pain 02/20/2017  . Precordial chest pain   .  Angina pectoris ([place]) 02/19/2017  . Atherosclerotic heart disease of native coronary artery with unstable angina pectoris ([place]) 03/08/2015  . Hyperlipidemia 01/26/2015  . Cervical spondylosis without myelopathy 07/28/2014  . Cervical spondylosis 07/28/2014  . Constipation 05/19/2014  . Pain 05/18/2014  . PAD  (peripheral artery disease) ([place]) 03/20/2012  . Unstable angina ([place]) 05/05/2011  . DDD (degenerative disc disease)   . HNP (herniated nucleus pulposus), lumbar   . Pancreatic mass   . Nephrolithiasis   . Carotid bruit   . S/P CABG x 4 03/08/2011  . Bruit 07/06/2010  . NONSPECIFIC ABN FINDING RAD & OTH EXAM GI TRACT 06/09/2009  . NAUSEA ALONE 05/28/2009  . CHANGE IN BOWELS 05/28/2009  . Elevated lipids 05/27/2009  . Abdominal pain, generalized 05/27/2009  . Type 2 diabetes mellitus with vascular disease ([place]) 12/16/2008  . Essential hypertension 12/16/2008  . BACK PAIN 12/16/2008  . Palpitations 12/16/2008    Past Medical History: Past Medical History:  Diagnosis Date  . CAD (coronary artery disease)    a. 04/2007 s/p CABG x 4  - LIMA->LAD, VG->OM1->Om2, VG->PDA;  b. 12/2008 & 06/2010 Caths - Native multivessel dzs with 4/4 patent grafts. c. cath 03/09/2015 patent grafts, unchanged from prior cath.  . Cancer [facility])    pancreatic  March 2011  . Carotid bruit    a. 07/2010 U/S- 0-39% bilat ICA stenosis  . Chest pain    Noncardiac probably related to reflux  . DDD (degenerative disc disease)    several surgeries  . Fracture acetabulum-closed ([place]) 07/08/2018  . GERD (gastroesophageal reflux disease)   . HNP (herniated nucleus pulposus), lumbar    a. L2-3, s/p laminotomy, microdiskectomy 02/2009  . HOH (hard of hearing)    uses amplifiers   . Nephrolithiasis   . PAD (peripheral artery disease) ([place]) 04/2014   s/p L Common Iliac Stent  . Palpitations   . 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 Pneumonia    hx of  pna  . Pure hypercholesterolemia    statin intolerance  . Type II or unspecified type diabetes mellitus without mention of complication, not stated as uncontrolled    insulin dependent  . Unspecified essential hypertension     Past Surgical History: Past Surgical History:  Procedure Laterality Date  . ABDOMINAL  AORTAGRAM N/A 05/13/2014   Procedure: ABDOMINAL [name];  Surgeon: Wellington Hampshire, MD;  Location: [place] CATH LAB;  Service: Cardiovascular;  Laterality: N/A;  . ANTERIOR CERVICAL DECOMP/DISCECTOMY FUSION N/A 07/28/2014   Procedure: ANTERIOR CERVICAL DECOMPRESSION/DISCECTOMY FUSION CERVICAL FOUR-FIVE CERVICAL FIVE-SIX ;  Surgeon: Earnie Larsson, MD;  Location: [place] NEURO ORS;  Service: Neurosurgery;  Laterality: N/A;  . APPENDECTOMY    . arthroscopy right knee    . BACK SURGERY    . CARDIAC CATHETERIZATION N/A 03/09/2015   Procedure: Left Heart Cath and Cors/Grafts Angiography;  Surgeon: [name] Sine, MD;  Location: [place] CV LAB;  Service: Cardiovascular;  Laterality: N/A;  . CHOLECYSTECTOMY    . CORONARY ARTERY BYPASS GRAFT     X 4 2009 (LIMA to LAD, SVG to first cicumflex marginal branch with sequential SVG to second cicumflex marginal branch  and saphenous vein to posterior descending coronary artery with endoscopic,vein harvest rt. lower exttremity by Dr.Owen on March 31,2009  . CYST REMOVED     FROM SPINE  . CYSTOURETHROSCOPY     right retrograde pyelogram,manipulate stone in the renal pelvis, rt. double-j catheter.  [name] Kitchen  EYE SURGERY    . KNEE SURGERY     rt knee  . LEFT HEART CATH AND CORS/GRAFTS ANGIOGRAPHY N/A 02/20/2017   Procedure: LEFT HEART CATH AND CORS/GRAFTS ANGIOGRAPHY;  Surgeon: Leonie Man, MD;  Location: [place] CV LAB;  Service: Cardiovascular;  Laterality: N/A;  . LEFT HEART CATH AND CORS/GRAFTS ANGIOGRAPHY N/A 05/28/2018   Procedure: LEFT HEART CATH AND CORS/GRAFTS ANGIOGRAPHY;  Surgeon: Burnell Blanks, MD;  Location: [place] CV LAB;  Service: Cardiovascular;  Laterality: N/A;  . NEPHROLITHOTOMY     PERCUTANEOUS  . PARS PLANA VITRECTOMY     lt. eye,retinal photocoagulation lt. eye, membrane peel lt. eye.  [name] Kitchen PERCUTANEOUS STENT INTERVENTION Left 05/13/2014   Procedure: PERCUTANEOUS STENT INTERVENTION;  Surgeon: Wellington Hampshire, MD;  Location: [place] CATH  LAB;  Service: Cardiovascular;  Laterality: Left;  COMMON ILLIAC  . re-exploratin of laminectomy  05/2008   (RT L2-3) WITH REDO MICRODISKECTOMY    Social History: Social History   Tobacco Use  . Smoking status: Never Smoker  . Smokeless tobacco: Never Used  Substance Use Topics  . Alcohol use: No  . Drug use: No     Family History: Family History  Problem Relation Age of Onset  . Heart attack Father        died of MI @ 28  . Diabetes Brother        type 2  . Other Mother        died @ 49  . Coronary artery disease Sister        alive  . Other Other        no fh of colon cancer  . Coronary artery disease Brother        s/p CABG.  Alive @ 81   Allergies and Medications: Allergies  Allergen Reactions  . Oxycodone Hcl Nausea And Vomiting  . Rosuvastatin Calcium Other (See Comments)    Myalgias  . Tramadol Other (See Comments)    Makes patient "spaced out and dizzy"  . Iohexol Hives    Patient is ok with premedication (Benadryl 50 mg by mouth); Onset Date: 12/03/2007   . Statins Other (See Comments)    Myalgias- can tolerate Atorvastatin    No current facility-administered medications on file prior to encounter.    Current Outpatient Medications on File Prior to Encounter  Medication Sig Dispense Refill  . acetaminophen (TYLENOL) 325 MG tablet Take 650 mg by mouth every 6 (six) hours as needed (for pain).     [name] Kitchen amLODipine (NORVASC) 10 MG tablet Take 1 tablet (10 mg total) by mouth daily. 30 tablet 1  . atorvastatin (LIPITOR) 20 MG tablet Take 1 tablet (20 mg total) by mouth at bedtime. 30 tablet 1  . bisacodyl (DULCOLAX) 5 MG EC tablet Take 5 mg by mouth every 3 (three) days.     [name] Kitchen docusate sodium (COLACE) 100 MG capsule Take 1 capsule (100 mg total) by mouth 2 (two) times daily. (Patient taking differently: Take 100 mg by mouth every 3 (three) days. ) 10 capsule 0  . esomeprazole (NEXIUM) 40 MG capsule Take 1 capsule (40 mg total) by mouth 2 (two) times daily  before a meal. 60 capsule 0  . glipiZIDE (GLUCOTROL) 5 MG tablet Take 0.5 tablets (2.5 mg total) by mouth 2 (two) times daily before a meal. 60 tablet 0  . Insulin Glargine (LANTUS) 100 UNIT/ML Solostar Pen Inject 18 Units into the skin at bedtime. 15 mL 11  .  lisinopril (ZESTRIL) 20 MG tablet Take 20 mg by mouth daily.    . Melatonin 3 MG TABS Take 1 tablet (3 mg total) by mouth at bedtime. 30 tablet 0  . nitroGLYCERIN (NITROSTAT) 0.4 MG SL tablet Place 1 tablet (0.4 mg total) under the tongue every 5 (five) minutes as needed for chest pain. 25 tablet 3  . senna (SENOKOT) 8.6 MG TABS tablet Take 1 tablet (8.6 mg total) by mouth at bedtime as needed for mild constipation. 120 each 0  . sucralfate (CARAFATE) 1 GM/10ML suspension Take 10 mLs (1 g total) by mouth 4 (four) times daily. 420 mL 0  . tamsulosin (FLOMAX) 0.4 MG CAPS capsule Take 1 capsule (0.4 mg total) by mouth daily after supper. 30 capsule 0  . isosorbide mononitrate (IMDUR) 30 MG 24 hr tablet Take 1 tablet (30 mg total) by mouth daily. 90 tablet 3  . Multiple Vitamin (MULTIVITAMIN WITH MINERALS) TABS tablet Take 1 tablet by mouth daily.    [name] Kitchen telmisartan (MICARDIS) 80 MG tablet Take 1 tablet (80 mg total) by mouth daily. 30 tablet 11    Objective: BP (!) 147/63   Pulse (!) 58   Temp 98.8 F (37.1 C) (Oral)   Resp 20   Ht 5\' 2"  (1.575 m)   Wt 68.5 kg   SpO2 97%   BMI 27.62 kg/m  Exam: General: Appears well, no acute distress. Age appropriate. Eyes: No visual signs of ICP or ocular hemorrages Cardiac: RRR, normal heart sounds, no murmurs Respiratory: CTAB, normal effort Abdomen: soft, nontender, nondistended, NABS Extremities: No edema or cyanosis. No calf tenderness with palpation. Equally warm bilaterally. Skin: Warm and dry, no rashes noted Neuro: alert and oriented, no focal deficits Psych: normal affect  Labs and Imaging: CBC BMET  Recent Labs  Lab 10/18/18 1538  WBC 10.6*  HGB 14.1  HCT 41.8  PLT 306    Recent Labs  Lab 10/18/18 1538  NA 140  K 3.7  CL 101  CO2 29  BUN 18  CREATININE 0.78  GLUCOSE 153*  CALCIUM 9.5     EKG: Sinus rhythm Left anterior fascicular block Probable anteroseptal infarct, old (Same as last EKG on 07/20/2018)  CHEST - 2 VIEW Result date 10/16/2018 COMPARISON:  July 20, 2018 IMPRESSION: Right lower lobe atelectasis versus less likely airspace consolidation.  NUCLEAR MEDICINE PERFUSION LUNG SCAN Result date 10/17/2018 COMPARISON:  Radiograph 10/16/2018, CT PA 07/09/2018. IMPRESSION: Several small peripheral perfusion defects primary in the LEFT lower lobe suggest small distal pulmonary emboli. Findings could represent chronic or acute pulmonary emboli.  CT HEAD WITHOUT CONTRAST Result date 10/18/2018 COMPARISON:  07/23/2018 and earlier IMPRESSION: 1. Resolved layering blood products within the LATERAL ventricles. 2. No evidence for acute intracranial abnormality. 3. Atrophy and small vessel disease. 4. Chronic sinusitis.  Gerlene Fee, DO 10/18/2018, 8:24 PM PGY-1, [place] [facility] pager: [phone], text pages welcome  FPTS Upper-Level Resident Addendum   I have independently interviewed and examined the patient. I have discussed the above with the original author and agree with their documentation. My edits for correction/addition/clarification are in purple. Please see also any attending notes.    [place] Maddyson Keil, DO PGY-3, [facility] 10/19/2018 12:20 AM  FPTS Service pager: [phone] (text pages welcome through [facility])

## 2018-10-18 NOTE — Telephone Encounter (Signed)
LMTCB. Will hold in triage until we are able to contact patient.

## 2018-10-18 NOTE — Progress Notes (Unsigned)
Bilateral lower venous has been completed. Acute DVT in the left mid calf peroneal vein. Patient instructed to go to [facility] emergency room per Dr. Gustavus Bryant office.  Preliminary results can be found under CV proc through chart review.  Wilkie Aye RVT [facility]

## 2018-10-18 NOTE — ED Notes (Signed)
ED TO INPATIENT HANDOFF REPORT  ED Nurse Name and Phone #: Almyra Free [phone]  S Name/Age/Gender Ricardo Le 78 y.o. male Room/Bed: 039C/039C  Code Status   Code Status: Prior  Home/SNF/Other Home Patient oriented to: self, place, time and situation Is this baseline? Yes   Triage Complete: Triage complete  Chief Complaint BLOOD CLOT IN LEG  Triage Note Pt sent here by pcp for # DVT results to LLE as well as an abnormal VQ scan.  Pt appears in no distress.   Allergies Allergies  Allergen Reactions  . Oxycodone Hcl Nausea And Vomiting  . Ranexa [Ranolazine] Other (See Comments)    Caused weakness (possibly)  . Rosuvastatin Calcium Other (See Comments)    Muscle pain  . Tramadol Other (See Comments)    Makes patient "spaced out and dizzy"  . Iohexol Hives    Patient is ok with premedication (Benadryl 50 mg by mouth); Onset Date: 12/03/2007   . Statins Other (See Comments)    Muscle pain, but can tolerate Atorvastatin     Level of Care/Admitting Diagnosis ED Disposition    ED Disposition Condition [place] [facility]: [place] [[id number]]  Level of Care: Telemetry Cardiac [103]  Covid Evaluation: Confirmed COVID Negative  Diagnosis: PE (pulmonary thromboembolism) [facility]) [id number]  Admitting Physician: SHIRLEY, [place] [id number]  Attending Physician: Zenia Resides [[id number]]  Estimated length of stay: past midnight tomorrow  Certification:: I certify this patient will need inpatient services for at least 2 midnights  PT Class (Do Not Modify): Inpatient [101]  PT Acc Code (Do Not Modify): Private [1]       B Medical/Surgery History Past Medical History:  Diagnosis Date  . CAD (coronary artery disease)    a. 04/2007 s/p CABG x 4  - LIMA->LAD, VG->OM1->Om2, VG->PDA;  b. 12/2008 & 06/2010 Caths - Native multivessel dzs with 4/4 patent grafts. c. cath 03/09/2015 patent grafts, unchanged from prior cath.  . Cancer [facility])    pancreatic  March  2011  . Carotid bruit    a. 07/2010 U/S- 0-39% bilat ICA stenosis  . Chest pain    Noncardiac probably related to reflux  . DDD (degenerative disc disease)    several surgeries  . Fracture acetabulum-closed ([place]) 07/08/2018  . GERD (gastroesophageal reflux disease)   . HNP (herniated nucleus pulposus), lumbar    a. L2-3, s/p laminotomy, microdiskectomy 02/2009  . HOH (hard of hearing)    uses amplifiers   . Nephrolithiasis   . PAD (peripheral artery disease) ([place]) 04/2014   s/p L Common Iliac Stent  . Palpitations   . 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 Pneumonia    hx of  pna  . Pure hypercholesterolemia    statin intolerance  . Type II or unspecified type diabetes mellitus without mention of complication, not stated as uncontrolled    insulin dependent  . Unspecified essential hypertension    Past Surgical History:  Procedure Laterality Date  . ABDOMINAL AORTAGRAM N/A 05/13/2014   Procedure: ABDOMINAL [name];  Surgeon: Wellington Hampshire, MD;  Location: [place] CATH LAB;  Service: Cardiovascular;  Laterality: N/A;  . ANTERIOR CERVICAL DECOMP/DISCECTOMY FUSION N/A 07/28/2014   Procedure: ANTERIOR CERVICAL DECOMPRESSION/DISCECTOMY FUSION CERVICAL FOUR-FIVE CERVICAL FIVE-SIX ;  Surgeon: Earnie Larsson, MD;  Location: [place] NEURO ORS;  Service: Neurosurgery;  Laterality: N/A;  . APPENDECTOMY    . arthroscopy right knee    .  BACK SURGERY    . CARDIAC CATHETERIZATION N/A 03/09/2015   Procedure: Left Heart Cath and Cors/Grafts Angiography;  Surgeon: [name] Sine, MD;  Location: [place] CV LAB;  Service: Cardiovascular;  Laterality: N/A;  . CHOLECYSTECTOMY    . CORONARY ARTERY BYPASS GRAFT     X 4 2009 (LIMA to LAD, SVG to first cicumflex marginal branch with sequential SVG to second cicumflex marginal branch  and saphenous vein to posterior descending coronary artery with endoscopic,vein harvest rt. lower exttremity by Dr.Owen on March 31,2009   . CYST REMOVED     FROM SPINE  . CYSTOURETHROSCOPY     right retrograde pyelogram,manipulate stone in the renal pelvis, rt. double-j catheter.  [name] Kitchen EYE SURGERY    . KNEE SURGERY     rt knee  . LEFT HEART CATH AND CORS/GRAFTS ANGIOGRAPHY N/A 02/20/2017   Procedure: LEFT HEART CATH AND CORS/GRAFTS ANGIOGRAPHY;  Surgeon: Leonie Man, MD;  Location: [place] CV LAB;  Service: Cardiovascular;  Laterality: N/A;  . LEFT HEART CATH AND CORS/GRAFTS ANGIOGRAPHY N/A 05/28/2018   Procedure: LEFT HEART CATH AND CORS/GRAFTS ANGIOGRAPHY;  Surgeon: Burnell Blanks, MD;  Location: [place] CV LAB;  Service: Cardiovascular;  Laterality: N/A;  . NEPHROLITHOTOMY     PERCUTANEOUS  . PARS PLANA VITRECTOMY     lt. eye,retinal photocoagulation lt. eye, membrane peel lt. eye.  [name] Kitchen PERCUTANEOUS STENT INTERVENTION Left 05/13/2014   Procedure: PERCUTANEOUS STENT INTERVENTION;  Surgeon: Wellington Hampshire, MD;  Location: [place] CATH LAB;  Service: Cardiovascular;  Laterality: Left;  COMMON ILLIAC  . re-exploratin of laminectomy  05/2008   (RT L2-3) WITH REDO MICRODISKECTOMY     A IV Location/Drains/Wounds Patient Lines/Drains/Airways Status   Active Line/Drains/Airways    Name:   Placement date:   Placement time:   Site:   Days:   Peripheral IV 10/18/18 Right Antecubital   10/18/18    1733    Antecubital   less than 1   Peripheral IV 10/18/18 Right Hand   10/18/18    2116    Hand   less than 1   Incision (Closed) 07/28/14 Neck Other (Comment)   07/28/14    0940     1543   Pressure Injury 07/23/18 Sacrum Medial Stage II -  Partial thickness loss of dermis presenting as a shallow open ulcer with a red, pink wound bed without slough.   07/23/18    1859     87   Pressure Injury 07/23/18 Heel Left Stage I -  Intact skin with non-blanchable redness of a localized area usually over a bony prominence. soft boggy pink heel    07/23/18    2145     87   Pressure Injury 07/23/18 Heel Right Stage I -  Intact skin with  non-blanchable redness of a localized area usually over a bony prominence. pink boggy heel   07/23/18    2145     87          Intake/Output Last 24 hours No intake or output data in the 24 hours ending 10/18/18 2212  Labs/Imaging Results for orders placed or performed during the hospital encounter of 10/18/18 (from the past 48 hour(s))  CBC with Differential     Status: Abnormal   Collection Time: 10/18/18  3:38 PM  Result Value Ref Range   WBC 10.6 (H) 4.0 - 10.5 K/uL   RBC 4.45 4.22 - 5.81 MIL/uL   Hemoglobin 14.1 13.0 - 17.0 g/dL  HCT 41.8 39.0 - 52.0 %   MCV 93.9 80.0 - 100.0 fL   MCH 31.7 26.0 - 34.0 pg   MCHC 33.7 30.0 - 36.0 g/dL   RDW 12.6 11.5 - 15.5 %   Platelets 306 150 - 400 K/uL   nRBC 0.0 0.0 - 0.2 %   Neutrophils Relative % 62 %   Neutro Abs 6.6 1.7 - 7.7 K/uL   Lymphocytes Relative 25 %   Lymphs Abs 2.6 0.7 - 4.0 K/uL   Monocytes Relative 10 %   Monocytes Absolute 1.1 (H) 0.1 - 1.0 K/uL   Eosinophils Relative 2 %   Eosinophils Absolute 0.2 0.0 - 0.5 K/uL   Basophils Relative 1 %   Basophils Absolute 0.1 0.0 - 0.1 K/uL   Immature Granulocytes 0 %   Abs Immature Granulocytes 0.02 0.00 - 0.07 K/uL    Comment: Performed at [facility] [address], [place], [place] [address]  Comprehensive metabolic panel     Status: Abnormal   Collection Time: 10/18/18  3:38 PM  Result Value Ref Range   Sodium 140 135 - 145 mmol/L   Potassium 3.7 3.5 - 5.1 mmol/L   Chloride 101 98 - 111 mmol/L   CO2 29 22 - 32 mmol/L   Glucose, Bld 153 (H) 70 - 99 mg/dL   BUN 18 8 - 23 mg/dL   Creatinine, Ser 0.78 0.61 - 1.24 mg/dL   Calcium 9.5 8.9 - 10.3 mg/dL   Total Protein 7.4 6.5 - 8.1 g/dL   Albumin 3.6 3.5 - 5.0 g/dL   AST 19 15 - 41 U/L   ALT 23 0 - 44 U/L   Alkaline Phosphatase 72 38 - 126 U/L   Total Bilirubin 0.7 0.3 - 1.2 mg/dL   GFR calc non Af Amer >60 >60 mL/min   GFR calc Af Amer >60 >60 mL/min   Anion gap 10 5 - 15    Comment: Performed at [facility] [address] [place], [place] [address]  Protime-INR     Status: None   Collection Time: 10/18/18  3:38 PM  Result Value Ref Range   Prothrombin Time 13.5 11.4 - 15.2 seconds   INR 1.0 0.8 - 1.2    Comment: (NOTE) INR goal varies based on device and disease states. Performed at [facility], [place] [address] [place], [place] [address]   APTT     Status: None   Collection Time: 10/18/18  3:38 PM  Result Value Ref Range   aPTT 25 24 - 36 seconds    Comment: Performed at [facility] [address] [place], [place] [address]  Troponin I (High Sensitivity)     Status: None   Collection Time: 10/18/18  5:30 PM  Result Value Ref Range   Troponin I (High Sensitivity) 10 <18 ng/L    Comment: (NOTE) Elevated high sensitivity troponin I (hsTnI) values and significant  changes across serial measurements may suggest ACS but many other  chronic and acute conditions are known to elevate hsTnI results.  Refer to the "Links" section for chest pain algorithms and additional  guidance. Performed at [facility], [place] [address], [place], [place] [address]   SARS Coronavirus 2 [facility] order, Performed in [facility] hospital lab) Nasopharyngeal Nasopharyngeal Swab     Status: None   Collection Time: 10/18/18  5:35 PM   Specimen: Nasopharyngeal Swab  Result Value Ref Range   SARS  Coronavirus 2 NEGATIVE NEGATIVE    Comment: (NOTE) If result is NEGATIVE SARS-CoV-2 target nucleic acids are NOT DETECTED. The SARS-CoV-2 RNA is generally detectable in upper and lower  respiratory specimens during the acute phase of infection. The lowest  concentration of SARS-CoV-2 viral copies this assay can detect is 250  copies / mL. A negative result does not preclude SARS-CoV-2 infection  and should not be used as the sole basis for treatment or other  patient management decisions.  A negative result may occur with  improper specimen collection / handling, submission of specimen  other  than nasopharyngeal swab, presence of viral mutation(s) within the  areas targeted by this assay, and inadequate number of viral copies  (<250 copies / mL). A negative result must be combined with clinical  observations, patient history, and epidemiological information. If result is POSITIVE SARS-CoV-2 target nucleic acids are DETECTED. The SARS-CoV-2 RNA is generally detectable in upper and lower  respiratory specimens dur ing the acute phase of infection.  Positive  results are indicative of active infection with SARS-CoV-2.  Clinical  correlation with patient history and other diagnostic information is  necessary to determine patient infection status.  Positive results do  not rule out bacterial infection or co-infection with other viruses. If result is PRESUMPTIVE POSTIVE SARS-CoV-2 nucleic acids MAY BE PRESENT.   A presumptive positive result was obtained on the submitted specimen  and confirmed on repeat testing.  While 2019 novel coronavirus  (SARS-CoV-2) nucleic acids may be present in the submitted sample  additional confirmatory testing may be necessary for epidemiological  and / or clinical management purposes  to differentiate between  SARS-CoV-2 and other Sarbecovirus currently known to infect humans.  If clinically indicated additional testing with an alternate test  methodology [phone]) is advised. The SARS-CoV-2 RNA is generally  detectable in upper and lower respiratory sp ecimens during the acute  phase of infection. The expected result is Negative. Fact Sheet for Patients:  StrictlyIdeas.no Fact Sheet for Healthcare Providers: BankingDealers.co.za This test is not yet approved or cleared by the [place] FDA and has been authorized for detection and/or diagnosis of SARS-CoV-2 by FDA under an Emergency Use Authorization (EUA).  This EUA will remain in effect (meaning this test can be used) for the duration  of the COVID-19 declaration under Section 564(b)(1) of the Act, 21 U.S.C. section 360bbb-3(b)(1), unless the authorization is terminated or revoked sooner. Performed at [facility], [place] [address] [place], [place] [address]   CBG monitoring, ED     Status: Abnormal   Collection Time: 10/18/18  8:25 PM  Result Value Ref Range   Glucose-Capillary 109 (H) 70 - 99 mg/dL   Comment 1 Notify RN   Troponin I (High Sensitivity)     Status: None   Collection Time: 10/18/18  8:29 PM  Result Value Ref Range   Troponin I (High Sensitivity) 9 <18 ng/L    Comment: (NOTE) Elevated high sensitivity troponin I (hsTnI) values and significant  changes across serial measurements may suggest ACS but many other  chronic and acute conditions are known to elevate hsTnI results.  Refer to the "Links" section for chest pain algorithms and additional  guidance. Performed at [facility], [place] [address] [place], [place] [address]    Ct Head Wo Contrast  Result Date: 10/18/2018 CLINICAL DATA:  Intracranial hemorrhage, known, follow up. Patient now has pulmonary embolus/DVT. Scan to re-evaluate bleeding prior to determining anticoagulation. EXAM: CT HEAD  WITHOUT CONTRAST TECHNIQUE: Contiguous axial images were obtained from the base of the skull through the vertex without intravenous contrast. COMPARISON:  07/23/2018 and earlier FINDINGS: Brain: There is central and cortical atrophy. Periventricular white matter changes are consistent with small vessel disease. There is no intra or extra-axial fluid collection or mass lesion. The basilar cisterns and ventricles have a normal appearance. There is no CT evidence for acute infarction or hemorrhage. Layering blood in the dependent aspects of the LATERAL ventricles has resolved since the prior studies. Vascular: There is atherosclerotic calcifications of the internal carotid arteries. No hyperdense vessels. Skull: Normal. Negative for fracture or focal lesion.  Sinuses/Orbits: There is mucoperiosteal thickening of the paranasal sinuses. Air-fluid level or mucosal thickening are within the RIGHT sphenoid air cell, stable in appearance. No mastoid effusions. Other: None. IMPRESSION: 1. Resolved layering blood products within the LATERAL ventricles. 2. No evidence for acute intracranial abnormality. 3. Atrophy and small vessel disease. 4. Chronic sinusitis. Electronically Signed   By: Nolon Nations M.D.   On: 10/18/2018 19:34   Nm Pulmonary Perfusion  Addendum Date: 10/17/2018   ADDENDUM REPORT: 10/17/2018 12:32 ADDENDUM: Findings conveyed toMICHAEL WERT on 10/17/2018  at12:15. Electronically Signed   By: Suzy Bouchard M.D.   On: 10/17/2018 12:32   Result Date: 10/17/2018 CLINICAL DATA:  Short of breath. COPD, asthma. Chest pain on exertion EXAM: NUCLEAR MEDICINE PERFUSION LUNG SCAN TECHNIQUE: Perfusion images were obtained in multiple projections after intravenous injection of radiopharmaceutical. RADIOPHARMACEUTICALS:  1.4 mCi Tc-71m MAA COMPARISON:  Radiograph 10/16/2018, CT PA 07/09/2018. FINDINGS: There several very small peripheral perfusion defects within the LEFT lower lobe (approximately 3). Potential 1 small defect in the RIGHT lower lobe. No clear corresponding findings on comparison radiograph. There is chronic elevation of the RIGHT hemidiaphragm on the radiograph. No pulmonary emboli identified on the comparison CTA 07/09/2018 IMPRESSION: Several small peripheral perfusion defects primary in the LEFT lower lobe suggest small distal pulmonary emboli. Findings could represent chronic or acute pulmonary emboli. Electronically Signed: By: Suzy Bouchard M.D. On: 10/17/2018 12:00   Vas [place] Lower Extremity Venous (dvt)  Result Date: 10/18/2018  Lower Venous Study Indications: Pain, SOB, and D Dimer. Patient complains of shortness of breath for the past two weeks. He has some tenderness in the left calf. Other Indications: Abnormal VQ scan.  Performing  Technologist: Wilkie Aye RVT  Examination Guidelines: A complete evaluation includes B-mode imaging, spectral Doppler, color Doppler, and power Doppler as needed of all accessible portions of each vessel. Bilateral testing is considered an integral part of a complete examination. Limited examinations for reoccurring indications may be performed as noted.  +---------+---------------+---------+-----------+----------+--------------+ RIGHT    CompressibilityPhasicitySpontaneityPropertiesThrombus Aging +---------+---------------+---------+-----------+----------+--------------+ CFV      Full           Yes      Yes                                 +---------+---------------+---------+-----------+----------+--------------+ SFJ      Full           Yes      Yes                                 +---------+---------------+---------+-----------+----------+--------------+ FV Prox  Full           Yes      Yes                                 +---------+---------------+---------+-----------+----------+--------------+  FV Mid   Full           Yes      Yes                                 +---------+---------------+---------+-----------+----------+--------------+ FV DistalFull           Yes      Yes                                 +---------+---------------+---------+-----------+----------+--------------+ PFV      Full                                                        +---------+---------------+---------+-----------+----------+--------------+ POP      Full           Yes      Yes                                 +---------+---------------+---------+-----------+----------+--------------+ PTV      Full           Yes      Yes                                 +---------+---------------+---------+-----------+----------+--------------+ PERO     Full           Yes      Yes                                  +---------+---------------+---------+-----------+----------+--------------+ Gastroc  Full                                                        +---------+---------------+---------+-----------+----------+--------------+  +---------+---------------+---------+-----------+----------+--------------+ LEFT     CompressibilityPhasicitySpontaneityPropertiesThrombus Aging +---------+---------------+---------+-----------+----------+--------------+ CFV      Full           Yes      Yes                                 +---------+---------------+---------+-----------+----------+--------------+ SFJ      Full           Yes      Yes                                 +---------+---------------+---------+-----------+----------+--------------+ FV Prox  Full           Yes      Yes                                 +---------+---------------+---------+-----------+----------+--------------+ FV Mid   Full           Yes      Yes                                 +---------+---------------+---------+-----------+----------+--------------+  FV DistalFull           Yes      Yes                                 +---------+---------------+---------+-----------+----------+--------------+ PFV      Full                                                        +---------+---------------+---------+-----------+----------+--------------+ POP      Full           Yes      Yes                                 +---------+---------------+---------+-----------+----------+--------------+ PTV      Full           Yes      Yes                                 +---------+---------------+---------+-----------+----------+--------------+ PERO     None           No       No         dilated   Acute          +---------+---------------+---------+-----------+----------+--------------+ Gastroc  Full                                                         +---------+---------------+---------+-----------+----------+--------------+ GSV      Full           Yes      Yes                                 +---------+---------------+---------+-----------+----------+--------------+  Findings reported to Dr Melvyn Novas at 1:30 pm. Patient instructed to go to [facility] emergency room for treatment.  Summary: Right: No evidence of deep vein thrombosis in the lower extremity. No indirect evidence of obstruction proximal to the inguinal ligament. No cystic structure found in the popliteal fossa. Left: Findings consistent with acute deep vein thrombosis involving the left peroneal veins. No cystic structure found in the popliteal fossa. All other veins visualized appear fully compressible and demonstrate appropriate Doppler characteristics.  *See table(s) above for measurements and observations. Electronically signed by Larae Grooms MD on 10/18/2018 at 6:11:51 PM.    Final     Pending Labs Unresulted Labs (From admission, onward)    Start     Ordered   10/20/18 0500  Heparin level (unfractionated)  Daily,   R     10/18/18 2025   10/19/18 0500  CBC  Daily,   R     10/18/18 2025   10/19/18 0300  Heparin level (unfractionated)  Once-Timed,   STAT     10/18/18 2025   Signed and Held  Basic metabolic panel  Tomorrow morning,   R     Signed and Held   Signed and  Held  CBC  Tomorrow morning,   R     Signed and Held          Vitals/Pain Today's Vitals   10/18/18 1930 10/18/18 1948 10/18/18 2048 10/18/18 2144  BP: (!) 170/64     Pulse:      Resp:      Temp:      TempSrc:      SpO2:      Weight:      Height:      PainSc:  0-No pain 0-No pain 0-No pain    Isolation Precautions No active isolations  Medications Medications  heparin ADULT infusion 100 units/mL (25000 units/279mL sodium chloride 0.45%) (1,200 Units/hr Intravenous New Bag/Given 10/18/18 2115)  heparin bolus via infusion 2,500 Units (2,500 Units Intravenous Bolus from Bag 10/18/18 2116)     Mobility walks     Focused Assessments Neuro Assessment Handoff:          Neuro Assessment: Within Defined Limits Neuro Checks:      Last Documented NIHSS Modified Score:   Has TPA been given? No If patient is a Neuro Trauma and patient is going to OR before floor call report to [facility] nurse: [phone] or [phone]     R Recommendations: See Admitting Provider Note  Report given to:   Additional Notes:

## 2018-10-18 NOTE — Discharge Summary (Signed)
[facility] Discharge Summary  Patient name: Ricardo Le record number: [id number] Date of birth: 1940/11/22 Age: 78 y.o. Gender: male Date of Admission: 10/18/2018  Date of Discharge: 10/19/2018 Admitting Physician: Zenia Resides, MD  Primary Care Provider: Angelina Sheriff, MD Consultants: None  Indication for Hospitalization: DVT with possible small PEs  Discharge Diagnoses/Problem List:  DVT with PE Subarachnoid hemorrhage 2/2 fall with head injury 07/20/2018 Acetabular fracture, closed Unstable angina CAD s/p CABG x4 PAD History of pancreatic cancer DDD GERD Hard of hearing HLD T2 DM HTN Constipation  Disposition: Home with home health  Discharge Condition: Stable, improved  Discharge Exam:  General: Appears well, no acute distress. Age appropriate. Cardiac: RRR, normal heart sounds, no murmurs. Chest pain reproducible with grip strength test Respiratory: CTAB, mildly increase effort Abdomen: soft, mildly tender, nondistended Extremities: No edema or cyanosis. Skin: Warm and dry, no rashes noted Neuro: alert and oriented, no focal deficits. Direct and consensual light reflex present. PERRL. EOMI. Psych: normal affect  Brief Hospital Course:  Ricardo Le is a 78 y.o. male who presented with weakness and dyspnea, with work-up revealing a PE and LLE DVT.  He was placed on a heparin drip after neurosurgery was consulted and cleared him for anticoagulation due to his recent subarachnoid hemorrhage.  CT head showed resolving hemorrhage without any new bleeding.  Patient also complained of chest pain several times during his admission.  EKG showed no changes, and troponins were within normal limits and flat.  Due to his extensive cardiac history, cardiology was consulted.  They recommended stopping his Imdur and starting Isordil instead since Imdur has given him chest pain in the past.  Patient has had failure to thrive and  frailty, which increases risk for falls and subsequent bleeding while on anticoagulation.  PT and OT evaluated patient, and home health was ordered for the patient at PT's request.  Patient was given the treatment dose of Eliquis at 10 mg and discharged home on 9/5 with plans to continue Eliquis 10 mg twice daily for 7 days before transitioning to 5 mg twice daily.  Case management was consulted to ensure that patient would be able to afford this medication with plans for [facility] and to follow-up with patient after he received a coupon for 30 days of free Eliquis.  Patient was discharged on 9/5 after appropriate counseling and evaluation by cardiology.  Issues for Follow Up:  1. Ensure that patient has discontinued his glipizide that was previously discontinued during hospitalization in 07/2018. 2. Ensure that patient continues to take Eliquis as prescribed 3. Reevaluate patient's fall risk and encourage continued PT for improvement of strength and balance  Significant Procedures: None  Significant Labs and Imaging:  Recent Labs  Lab 10/16/18 1039 10/18/18 1538 10/19/18 0227  WBC 8.7 10.6* 9.4  HGB 14.3 14.1 12.6*  HCT 41.3 41.8 36.9*  PLT 290.0 306 262   Recent Labs  Lab 10/16/18 1039 10/18/18 1538 10/19/18 0227  NA 139 140 137  K 3.9 3.7 3.2*  CL 98 101 102  CO2 32 29 27  GLUCOSE 184* 153* 242*  BUN 17 18 13   CREATININE 0.83 0.78 0.76  CALCIUM 9.7 9.5 8.8*  ALKPHOS  --  72  --   AST  --  19  --   ALT  --  23  --   ALBUMIN  --  3.6  --    Dg Chest 2 View  Result Date:  10/19/2018 CLINICAL DATA:  Chest pain, blurred vision, and nausea. EXAM: CHEST - 2 VIEW COMPARISON:  10/16/2018 FINDINGS: The heart size and mediastinal contours are within normal limits. Prior CABG again noted. Chronic elevation of right hemidiaphragm is again seen. No evidence of pulmonary infiltrate or edema. No evidence of pleural effusion. Both lungs are clear. The visualized skeletal structures are  unremarkable. IMPRESSION: Stable exam. No active cardiopulmonary disease. Electronically Signed   By: Marlaine Hind M.D.   On: 10/19/2018 07:53   Ct Head Wo Contrast  Result Date: 10/18/2018 CLINICAL DATA:  Intracranial hemorrhage, known, follow up. Patient now has pulmonary embolus/DVT. Scan to re-evaluate bleeding prior to determining anticoagulation. EXAM: CT HEAD WITHOUT CONTRAST TECHNIQUE: Contiguous axial images were obtained from the base of the skull through the vertex without intravenous contrast. COMPARISON:  07/23/2018 and earlier FINDINGS: Brain: There is central and cortical atrophy. Periventricular white matter changes are consistent with small vessel disease. There is no intra or extra-axial fluid collection or mass lesion. The basilar cisterns and ventricles have a normal appearance. There is no CT evidence for acute infarction or hemorrhage. Layering blood in the dependent aspects of the LATERAL ventricles has resolved since the prior studies. Vascular: There is atherosclerotic calcifications of the internal carotid arteries. No hyperdense vessels. Skull: Normal. Negative for fracture or focal lesion. Sinuses/Orbits: There is mucoperiosteal thickening of the paranasal sinuses. Air-fluid level or mucosal thickening are within the RIGHT sphenoid air cell, stable in appearance. No mastoid effusions. Other: None. IMPRESSION: 1. Resolved layering blood products within the LATERAL ventricles. 2. No evidence for acute intracranial abnormality. 3. Atrophy and small vessel disease. 4. Chronic sinusitis. Electronically Signed   By: Nolon Nations M.D.   On: 10/18/2018 19:34   Vas [place] Lower Extremity Venous (dvt)  Result Date: 10/18/2018  Lower Venous Study Indications: Pain, SOB, and D Dimer. Patient complains of shortness of breath for the past two weeks. He has some tenderness in the left calf. Other Indications: Abnormal VQ scan.  Performing Technologist: Wilkie Aye RVT  Examination Guidelines: A  complete evaluation includes B-mode imaging, spectral Doppler, color Doppler, and power Doppler as needed of all accessible portions of each vessel. Bilateral testing is considered an integral part of a complete examination. Limited examinations for reoccurring indications may be performed as noted.  +---------+---------------+---------+-----------+----------+--------------+ RIGHT    CompressibilityPhasicitySpontaneityPropertiesThrombus Aging +---------+---------------+---------+-----------+----------+--------------+ CFV      Full           Yes      Yes                                 +---------+---------------+---------+-----------+----------+--------------+ SFJ      Full           Yes      Yes                                 +---------+---------------+---------+-----------+----------+--------------+ FV Prox  Full           Yes      Yes                                 +---------+---------------+---------+-----------+----------+--------------+ FV Mid   Full           Yes      Yes                                 +---------+---------------+---------+-----------+----------+--------------+  FV DistalFull           Yes      Yes                                 +---------+---------------+---------+-----------+----------+--------------+ PFV      Full                                                        +---------+---------------+---------+-----------+----------+--------------+ POP      Full           Yes      Yes                                 +---------+---------------+---------+-----------+----------+--------------+ PTV      Full           Yes      Yes                                 +---------+---------------+---------+-----------+----------+--------------+ PERO     Full           Yes      Yes                                 +---------+---------------+---------+-----------+----------+--------------+ Gastroc  Full                                                         +---------+---------------+---------+-----------+----------+--------------+  +---------+---------------+---------+-----------+----------+--------------+ LEFT     CompressibilityPhasicitySpontaneityPropertiesThrombus Aging +---------+---------------+---------+-----------+----------+--------------+ CFV      Full           Yes      Yes                                 +---------+---------------+---------+-----------+----------+--------------+ SFJ      Full           Yes      Yes                                 +---------+---------------+---------+-----------+----------+--------------+ FV Prox  Full           Yes      Yes                                 +---------+---------------+---------+-----------+----------+--------------+ FV Mid   Full           Yes      Yes                                 +---------+---------------+---------+-----------+----------+--------------+ FV DistalFull           Yes      Yes                                 +---------+---------------+---------+-----------+----------+--------------+  PFV      Full                                                        +---------+---------------+---------+-----------+----------+--------------+ POP      Full           Yes      Yes                                 +---------+---------------+---------+-----------+----------+--------------+ PTV      Full           Yes      Yes                                 +---------+---------------+---------+-----------+----------+--------------+ PERO     None           No       No         dilated   Acute          +---------+---------------+---------+-----------+----------+--------------+ Gastroc  Full                                                        +---------+---------------+---------+-----------+----------+--------------+ GSV      Full           Yes      Yes                                  +---------+---------------+---------+-----------+----------+--------------+  Findings reported to Dr Melvyn Novas at 1:30 pm. Patient instructed to go to [facility] emergency room for treatment.  Summary: Right: No evidence of deep vein thrombosis in the lower extremity. No indirect evidence of obstruction proximal to the inguinal ligament. No cystic structure found in the popliteal fossa. Left: Findings consistent with acute deep vein thrombosis involving the left peroneal veins. No cystic structure found in the popliteal fossa. All other veins visualized appear fully compressible and demonstrate appropriate Doppler characteristics.  *See table(s) above for measurements and observations. Electronically signed by Larae Grooms MD on 10/18/2018 at 6:11:51 PM.    Final     Results/Tests Pending at Time of Discharge: None  Discharge Medications:  Allergies as of 10/19/2018      Reactions   Oxycodone Hcl Nausea And Vomiting   Ranexa [ranolazine] Other (See Comments)   Caused weakness (possibly)   Rosuvastatin Calcium Other (See Comments)   Muscle pain   Tramadol Other (See Comments)   Makes patient "spaced out and dizzy"   Iohexol Hives   Patient is ok with premedication (Benadryl 50 mg by mouth); Onset Date: 12/03/2007   Statins Other (See Comments)   Muscle pain, but can tolerate Atorvastatin      Medication List    STOP taking these medications   isosorbide mononitrate 30 MG 24 hr tablet Commonly known as: IMDUR     TAKE these medications   acetaminophen 325 MG tablet Commonly known as: TYLENOL Take 650 mg by mouth every 6 (  six) hours as needed (for pain).   amLODipine 10 MG tablet Commonly known as: NORVASC Take 1 tablet (10 mg total) by mouth daily.   apixaban 5 MG Tabs tablet Commonly known as: Eliquis Take 1 tablet (5 mg total) by mouth 2 (two) times daily. Notes to patient: Start with the evening dose on 9/12, after you have completed the first 7 days as instructed below.     apixaban 5 MG Tabs tablet Commonly known as: ELIQUIS Take 2 tablets (10 mg total) by mouth 2 (two) times daily for 7 days. Notes to patient: You were given the evening dose for tonight. Start your prescription at home tomorrow morning.   atorvastatin 20 MG tablet Commonly known as: LIPITOR Take 1 tablet (20 mg total) by mouth at bedtime.   docusate sodium 100 MG capsule Commonly known as: COLACE Take 1 capsule (100 mg total) by mouth 2 (two) times daily. What changed: when to take this Notes to patient: Tonight   Dulcolax 5 MG EC tablet Generic drug: bisacodyl Take 5 mg by mouth every 3 (three) days.   esomeprazole 40 MG capsule Commonly known as: NEXIUM Take 1 capsule (40 mg total) by mouth 2 (two) times daily before a meal.   glipiZIDE 5 MG tablet Commonly known as: GLUCOTROL Take 0.5 tablets (2.5 mg total) by mouth 2 (two) times daily before a meal.   Insulin Glargine 100 UNIT/ML Solostar Pen Commonly known as: LANTUS Inject 18 Units into the skin at bedtime.   isosorbide dinitrate 5 MG tablet Commonly known as: ISORDIL Take 1 tablet (5 mg total) by mouth 2 (two) times daily.   lisinopril 20 MG tablet Commonly known as: ZESTRIL Take 20 mg by mouth daily.   Melatonin 3 MG Tabs Take 1 tablet (3 mg total) by mouth at bedtime.   multivitamin with minerals Tabs tablet Take 1 tablet by mouth daily.   nitroGLYCERIN 0.4 MG SL tablet Commonly known as: NITROSTAT Place 1 tablet (0.4 mg total) under the tongue every 5 (five) minutes as needed for chest pain.   senna 8.6 MG Tabs tablet Commonly known as: SENOKOT Take 1 tablet (8.6 mg total) by mouth at bedtime as needed for mild constipation.   sucralfate 1 GM/10ML suspension Commonly known as: CARAFATE Take 10 mLs (1 g total) by mouth 4 (four) times daily.   tamsulosin 0.4 MG Caps capsule Commonly known as: FLOMAX Take 1 capsule (0.4 mg total) by mouth daily after supper.   telmisartan 80 MG tablet Commonly  known as: Micardis Take 1 tablet (80 mg total) by mouth daily.      Discharge Instructions: Please refer to Patient Instructions section of EMR for full details.  Patient was counseled important signs and symptoms that should prompt return to medical care, changes in medications, dietary instructions, activity restrictions, and follow up appointments.   Follow-Up Appointments:   Kathrene Alu, MD 10/20/2018, 10:10 AM PGY-3, [place]

## 2018-10-18 NOTE — Telephone Encounter (Signed)
10/18/2018 1411  Received call report from lower extremity Dopplers performed today.  Those results are listed below:  Summary: Right: No evidence of deep vein thrombosis in the lower extremity. No indirect evidence of obstruction proximal to the inguinal ligament. No cystic structure found in the popliteal fossa.  Left: Findings consistent with acute deep vein thrombosis involving the left peroneal veins. No cystic structure found in the popliteal fossa.  Patient also recently completed a VQ scan after an elevated d-dimer lab result was received.  VQ scan results from yesterday are listed below:  10/17/2018-VQ scan- several small peripheral perfusion defects primary in the left lower lobe suggest small distal pulmonary emboli, findings could represent chronic or acute pulmonary emboli  Patient's vital signs were stable at last office visit.  Patient was dyspneic with physical exertion.  With the acute DVT as well as the abnormal VQ scan I did recommend for the patient to present to the emergency room for further evaluation.  Will route note to triage for follow-up to make sure the patient is scheduled closely with a follow-up visit with Dr. Melvyn Novas.  Will route to Dr. Melvyn Novas as Juluis Rainier.  Wyn Quaker, FNP

## 2018-10-18 NOTE — Progress Notes (Addendum)
[place] for heparin Indication: pulmonary embolus/DVT  Heparin Dosing Weight: 68.3 kg  Labs: Recent Labs    10/16/18 1039 10/18/18 1538  HGB 14.3 14.1  HCT 41.3 41.8  PLT 290.0 306  APTT  --  25  LABPROT  --  13.5  INR  --  1.0  CREATININE 0.83 0.78    Assessment: 61 yom presenting with slightly elevated d-dimer on 9/2 and SOB, found to have acute LLE DVT/PE and now presenting to the ER. Pharmacy consulted to dose heparin for VTE. Not on anticoagulation PTA.  Patient noted with hx of falls s/p IVH bilaterally with small amount of SAH on 07/20/18. Neurosurgery advised conservative care with f/u CT unchanged that admission. This admission, ED PA Wyn Quaker discussed with Neurosurgery PA Joelene Millin), who stated ok to anticoagulate this patient for PE from their standpoint. CBC wnl stable. No active bleed issues documented.  Goal of Therapy:  Heparin level 0.3-0.7 units/ml Monitor platelets by anticoagulation protocol: Yes   Plan:  Heparin 2500 unit bolus - lower bolus due to recent history of ICH Start heparin at 1200 units/h 6h heparin level Daily heparin level/CBC Monitor s/sx bleeding  Elicia Lamp, PharmD, BCPS Clinical Pharmacist 10/18/2018 8:06 PM

## 2018-10-18 NOTE — Telephone Encounter (Signed)
Thank you for working on this.  Agreed.  Routing to Dr. Melvyn Novas as Juluis Rainier.

## 2018-10-19 ENCOUNTER — Other Ambulatory Visit: Payer: Self-pay

## 2018-10-19 ENCOUNTER — Inpatient Hospital Stay (HOSPITAL_COMMUNITY): Payer: PPO

## 2018-10-19 DIAGNOSIS — I82452 Acute embolism and thrombosis of left peroneal vein: Secondary | ICD-10-CM | POA: Diagnosis not present

## 2018-10-19 DIAGNOSIS — I2782 Chronic pulmonary embolism: Secondary | ICD-10-CM | POA: Diagnosis not present

## 2018-10-19 DIAGNOSIS — R079 Chest pain, unspecified: Secondary | ICD-10-CM

## 2018-10-19 DIAGNOSIS — Z951 Presence of aortocoronary bypass graft: Secondary | ICD-10-CM | POA: Diagnosis not present

## 2018-10-19 DIAGNOSIS — I2699 Other pulmonary embolism without acute cor pulmonale: Secondary | ICD-10-CM | POA: Diagnosis not present

## 2018-10-19 DIAGNOSIS — R0609 Other forms of dyspnea: Secondary | ICD-10-CM | POA: Diagnosis not present

## 2018-10-19 DIAGNOSIS — Z86711 Personal history of pulmonary embolism: Secondary | ICD-10-CM | POA: Diagnosis not present

## 2018-10-19 DIAGNOSIS — K219 Gastro-esophageal reflux disease without esophagitis: Secondary | ICD-10-CM | POA: Diagnosis not present

## 2018-10-19 DIAGNOSIS — Z8507 Personal history of malignant neoplasm of pancreas: Secondary | ICD-10-CM | POA: Diagnosis not present

## 2018-10-19 DIAGNOSIS — I200 Unstable angina: Secondary | ICD-10-CM | POA: Diagnosis not present

## 2018-10-19 DIAGNOSIS — Z79899 Other long term (current) drug therapy: Secondary | ICD-10-CM | POA: Diagnosis not present

## 2018-10-19 DIAGNOSIS — Z794 Long term (current) use of insulin: Secondary | ICD-10-CM | POA: Diagnosis not present

## 2018-10-19 DIAGNOSIS — I609 Nontraumatic subarachnoid hemorrhage, unspecified: Secondary | ICD-10-CM | POA: Diagnosis not present

## 2018-10-19 DIAGNOSIS — I208 Other forms of angina pectoris: Secondary | ICD-10-CM | POA: Diagnosis not present

## 2018-10-19 DIAGNOSIS — I82409 Acute embolism and thrombosis of unspecified deep veins of unspecified lower extremity: Secondary | ICD-10-CM | POA: Diagnosis present

## 2018-10-19 DIAGNOSIS — O2230 Deep phlebothrombosis in pregnancy, unspecified trimester: Secondary | ICD-10-CM | POA: Diagnosis present

## 2018-10-19 DIAGNOSIS — Z20828 Contact with and (suspected) exposure to other viral communicable diseases: Secondary | ICD-10-CM | POA: Diagnosis not present

## 2018-10-19 DIAGNOSIS — I2510 Atherosclerotic heart disease of native coronary artery without angina pectoris: Secondary | ICD-10-CM | POA: Diagnosis not present

## 2018-10-19 LAB — CBC
HCT: 36.9 % — ABNORMAL LOW (ref 39.0–52.0)
Hemoglobin: 12.6 g/dL — ABNORMAL LOW (ref 13.0–17.0)
MCH: 31.4 pg (ref 26.0–34.0)
MCHC: 34.1 g/dL (ref 30.0–36.0)
MCV: 92 fL (ref 80.0–100.0)
Platelets: 262 10*3/uL (ref 150–400)
RBC: 4.01 MIL/uL — ABNORMAL LOW (ref 4.22–5.81)
RDW: 12.4 % (ref 11.5–15.5)
WBC: 9.4 10*3/uL (ref 4.0–10.5)
nRBC: 0 % (ref 0.0–0.2)

## 2018-10-19 LAB — HEPARIN LEVEL (UNFRACTIONATED)
Heparin Unfractionated: 0.37 IU/mL (ref 0.30–0.70)
Heparin Unfractionated: 0.43 IU/mL (ref 0.30–0.70)

## 2018-10-19 LAB — GLUCOSE, CAPILLARY
Glucose-Capillary: 142 mg/dL — ABNORMAL HIGH (ref 70–99)
Glucose-Capillary: 318 mg/dL — ABNORMAL HIGH (ref 70–99)

## 2018-10-19 LAB — BASIC METABOLIC PANEL
Anion gap: 8 (ref 5–15)
BUN: 13 mg/dL (ref 8–23)
CO2: 27 mmol/L (ref 22–32)
Calcium: 8.8 mg/dL — ABNORMAL LOW (ref 8.9–10.3)
Chloride: 102 mmol/L (ref 98–111)
Creatinine, Ser: 0.76 mg/dL (ref 0.61–1.24)
GFR calc Af Amer: >60 mL/min (ref >60)
GFR calc non Af Amer: >60 mL/min (ref >60)
Glucose, Bld: 242 mg/dL — ABNORMAL HIGH (ref 70–99)
Potassium: 3.2 mmol/L — ABNORMAL LOW (ref 3.5–5.1)
Sodium: 137 mmol/L (ref 135–145)

## 2018-10-19 LAB — LIPID PANEL
Cholesterol: 148 mg/dL (ref 0–200)
HDL: 43 mg/dL (ref >40)
LDL Cholesterol: 90 mg/dL (ref 0–99)
Total CHOL/HDL Ratio: 3.4 RATIO
Triglycerides: 73 mg/dL (ref <150)
VLDL: 15 mg/dL (ref 0–40)

## 2018-10-19 LAB — TROPONIN I (HIGH SENSITIVITY)
Troponin I (High Sensitivity): 9 ng/L (ref <18)
Troponin I (High Sensitivity): 7 ng/L (ref <18)

## 2018-10-19 MED ORDER — ISOSORBIDE DINITRATE 5 MG PO TABS: 5.0000 mg | ORAL_TABLET | Freq: Two times a day (BID) | ORAL | 0 refills | Status: DC

## 2018-10-19 MED ORDER — PROCHLORPERAZINE MALEATE 5 MG PO TABS
5.0000 mg | ORAL_TABLET | Freq: Once | ORAL | Status: AC
  Administered 2018-10-19: 5 mg via ORAL
  Filled 2018-10-19: qty 1

## 2018-10-19 MED ORDER — NITROGLYCERIN 0.4 MG SL SUBL
SUBLINGUAL_TABLET | SUBLINGUAL | Status: AC
  Administered 2018-10-19: 0.4 mg
  Filled 2018-10-19: qty 1

## 2018-10-19 MED ORDER — SUCRALFATE 1 GM/10ML PO SUSP
1.0000 g | Freq: Three times a day (TID) | ORAL | Status: DC
  Administered 2018-10-19: 1 g via ORAL
  Filled 2018-10-19: qty 10

## 2018-10-19 MED ORDER — ISOSORBIDE DINITRATE 10 MG PO TABS: 5.0000 mg | ORAL_TABLET | Freq: Two times a day (BID) | ORAL | Status: DC

## 2018-10-19 MED ORDER — APIXABAN 5 MG PO TABS: 5.0000 mg | ORAL_TABLET | Freq: Two times a day (BID) | ORAL | 0 refills | Status: DC

## 2018-10-19 MED ORDER — APIXABAN 5 MG PO TABS: 10.0000 mg | ORAL_TABLET | Freq: Two times a day (BID) | ORAL | 0 refills | Status: DC

## 2018-10-19 MED ORDER — POTASSIUM CHLORIDE CRYS ER 20 MEQ PO TBCR
40.0000 meq | EXTENDED_RELEASE_TABLET | Freq: Two times a day (BID) | ORAL | Status: DC
  Administered 2018-10-19: 40 meq via ORAL
  Filled 2018-10-19: qty 2

## 2018-10-19 MED ORDER — APIXABAN 5 MG PO TABS
10.0000 mg | ORAL_TABLET | Freq: Two times a day (BID) | ORAL | Status: DC
  Administered 2018-10-19: 10 mg via ORAL
  Filled 2018-10-19: qty 2

## 2018-10-19 MED ORDER — ALUM & MAG HYDROXIDE-SIMETH 200-200-20 MG/5ML PO SUSP
30.0000 mL | ORAL | Status: DC | PRN
  Administered 2018-10-19: 30 mL via ORAL
  Filled 2018-10-19: qty 30

## 2018-10-19 NOTE — Progress Notes (Signed)
ANTICOAGULATION CONSULT NOTE  Pharmacy Consult for heparin Indication: pulmonary embolus/DVT  Heparin Dosing Weight: 68.3 kg  Labs: Recent Labs    10/18/18 1538 10/19/18 0227 10/19/18 1150  HGB 14.1 12.6*  --   HCT 41.8 36.9*  --   PLT 306 262  --   APTT 25  --   --   LABPROT 13.5  --   --   INR 1.0  --   --   HEPARINUNFRC  --  0.37 0.43  CREATININE 0.78 0.76  --     Assessment: 79 yom presenting with slightly elevated d-dimer on 9/2 and SOB, found to have acute LLE DVT/PE and now presenting to the ER. Pharmacy consulted to dose heparin for VTE. Not on anticoagulation PTA.  Patient noted with hx of falls s/p IVH bilaterally with small amount of SAH on 07/20/18. Neurosurgery advised conservative care with f/u CT unchanged that admission. This admission, ED PA Wyn Quaker discussed with Neurosurgery PA Joelene Millin), who stated ok to anticoagulate this patient for PE from their standpoint. CBC wnl stable. No active bleed issues documented.  Previous HL overnight was therapeutic at 0.37 on 1200 units/hour.   Confirmatory heparin level today is therapeutic at 0.43 on 1200 units/hour. H&H has decreased to 12.6/36.9. plts have decreased to 262 but are wnl. Per nursing, there are no signs of bleeding and pt has not had a BM to report if bloody stools.   Goal of Therapy:  Heparin level 0.3-0.7 units/ml Monitor platelets by anticoagulation protocol: Yes   Plan:  Continue heparin at 1200 units/hr Check daily heparin level  Monitor s/sx of bleeding and CBC.  Follow up change to PO anticoagulation     Thank you,   Eddie Candle, PharmD PGY-1 Pharmacy Resident

## 2018-10-19 NOTE — Progress Notes (Signed)
This RN called in the room, pt complaining of nausea, blurry vision and chest tightness. VS taken, EKG done. Nitroglycerin sublingual x 1 given with relief. MD was notified and ordered Compazine tablet.

## 2018-10-19 NOTE — Progress Notes (Signed)
Occupational Therapy Evaluation Patient Details Name: Ricardo Le MRN: [id number] DOB: 1940/06/27 Today's Date: 10/19/2018    History of Present Illness Ricardo Le is a 78 y.o. male presenting with weakness and abnormal labs and imgaing secondary to a confirmed PE and DVT. PMH is significant for recent subarachnoid hemorrage w/ head injury and fall, unstable angina, PAD, CABG x4 T2DM, Nephrolithiasis, HLD, Consitpation, Pancreatic cancer, DDD, GERD.   Clinical Impression   PTA, pt modified independent with mobility and ADL. Given pt's history of falls and SAH, recommend follow up with HHOT to maximize functional level of independence and reduce risk of falls. Pt's daughter assists with IADL tasks as needed. All further OT to be addressed by [facility] services.     Follow Up Recommendations  Supervision - Intermittent;Home health OT    Equipment Recommendations  None recommended by OT    Recommendations for Other Services       Precautions / Restrictions Precautions Precautions: Fall      Mobility Bed Mobility Overal bed mobility: Modified Independent             General bed mobility comments: OOB in chair  Transfers Overall transfer level: Needs assistance   Transfers: Sit to/from Stand;Stand Pivot Transfers Sit to Stand: Supervision Stand pivot transfers: Supervision       General transfer comment: for safety    Balance Overall balance assessment: Needs assistance   Sitting balance-Leahy Scale: Good       Standing balance-Leahy Scale: Fair Standing balance comment: able to walk without device, but obvious balance/gait deficits                           ADL either performed or assessed with clinical judgement   ADL Overall ADL's : Needs assistance/impaired                                     Functional mobility during ADLs: Supervision/safety General ADL Comments: overall S with ADl     Vision         Perception      Praxis      Pertinent Vitals/Pain Pain Assessment: No/denies pain     Hand Dominance Right   Extremity/Trunk Assessment Upper Extremity Assessment Upper Extremity Assessment: Generalized weakness   Lower Extremity Assessment Lower Extremity Assessment: Defer to PT evaluation   Cervical / Trunk Assessment Cervical / Trunk Assessment: Kyphotic   Communication Communication Communication: HOH   Cognition Arousal/Alertness: Awake/alert Behavior During Therapy: WFL for tasks assessed/performed Overall Cognitive Status: No family/caregiver present to determine baseline cognitive functioning                                 General Comments: slightly slow processing, but also HOH with recent IVH/SAH; most likely close to baseline; will further assess Pt has assistance with finances and medication management by his daughter   General Comments  Pt able to verbalize need for RW for safety    Exercises     Shoulder Instructions      Home Living Family/patient expects to be discharged to:: Private residence Living Arrangements: Spouse/significant other;Children Available Help at Discharge: Family;Available 24 hours/day Type of Home: House Home Access: Stairs to enter [employer] of Steps: 1 Entrance Stairs-Rails: None Home Layout: One level     Bathroom  Shower/Tub: Occupational psychologist: Standard Bathroom Accessibility: Yes How Accessible: Accessible via walker Home Equipment: Bedside commode;Walker - 2 wheels;Shower seat - built in;Grab bars - tub/shower;Hand held shower head;Cane - single point          Prior Functioning/Environment Level of Independence: Independent        Comments: reports began walking without device about 2-3 weeks ago        OT Problem List: Decreased knowledge of precautions      OT Treatment/Interventions:      OT Goals(Current goals can be found in the care plan section) Acute Rehab OT  Goals Patient Stated Goal: to go home OT Goal Formulation: All assessment and education complete, DC therapy  OT Frequency:     Barriers to D/C:            Co-evaluation              AM-PAC OT "6 Clicks" Daily Activity     Outcome Measure Help from another person eating meals?: None Help from another person taking care of personal grooming?: A Little Help from another person toileting, which includes using toliet, bedpan, or urinal?: A Little Help from another person bathing (including washing, rinsing, drying)?: A Little Help from another person to put on and taking off regular upper body clothing?: A Little Help from another person to put on and taking off regular lower body clothing?: A Little 6 Click Score: 19   End of Session Nurse Communication: Mobility status  Activity Tolerance: Patient tolerated treatment well Patient left: in chair;with call bell/phone within reach;with chair alarm set  OT Visit Diagnosis: Unsteadiness on feet (R26.81)                Time: [id number] OT Time Calculation (min): 18 min Charges:  OT General Charges $OT Visit: 1 Visit OT Evaluation $OT Eval Moderate Complexity: [place], OT/L   Acute OT Clinical Specialist Acute Rehabilitation Services Pager [phone] Office [phone]   [facility] 10/19/2018, 12:21 PM

## 2018-10-19 NOTE — Consult Note (Signed)
CARDIOLOGY CONSULT NOTE       Patient ID: Ricardo Le MRN: [id number] DOB/AGE: Jun 25, 1940 78 y.o.  Admit date: 10/18/2018 Referring Physician: Aut[facility]Lott IM Primary Physician: Angelina Sheriff, MD Primary Cardiologist: Johnsie Cancel Reason for Consultation: Chest Pain  Active Problems:   Pulmonary embolism and infarction [facility])   Acute deep vein thrombosis (DVT) of left peroneal vein   Pulmonary embolism (HCC)   HPI:  Ricardo Le is a 78 y.o. male I follow for CAD/ PAD. He has known history of coronary artery disease status post coronary artery bypass grafting in 2009 .  Cardiac cath in 2017, 2019 and 2020 showed patent grafts with diffuse distal disease. He has angina but feels worse with Ranexa and imdur which has limited his medical Rx PAD stable with ABI on right 0.7 and left 0.84 October 2019.  Had fall with acetabular fracture and more falls in June with SAH.  Admitted with dyspnea chest pain and RLE pain and found to have PE. Neurology cleared him to start anticoagulation F/U CT showed resolution of SAH. Thinks SSCP is GI  Improved with GI cocktail Again notes pain worse after nitrates. Has r/o for MI and ECG with no acute changes    ROS All other systems reviewed and negative except as noted above  Past Medical History:  Diagnosis Date   CAD (coronary artery disease)    a. 04/2007 s/p CABG x 4  - LIMA->LAD, VG->OM1->Om2, VG->PDA;  b. 12/2008 & 06/2010 Caths - Native multivessel dzs with 4/4 patent grafts. c. cath 03/09/2015 patent grafts, unchanged from prior cath.   Cancer [facility])    pancreatic  March 2011   Carotid bruit    a. 07/2010 U/S- 0-39% bilat ICA stenosis   Chest pain    Noncardiac probably related to reflux   DDD (degenerative disc disease)    several surgeries   Fracture acetabulum-closed ([place]) 07/08/2018   GERD (gastroesophageal reflux disease)    HNP (herniated nucleus pulposus), lumbar    a. L2-3, s/p laminotomy, microdiskectomy 02/2009   HOH  (hard of hearing)    uses amplifiers    Nephrolithiasis    PAD (peripheral artery disease) ([place]) 04/2014   s/p L Common Iliac Stent   Palpitations    Pancreatic mass    a. 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  Alive @ 77    Social History   Socioeconomic History   Marital status: Married    Spouse name: Not on file   Number of children: Not on file   Years of education: Not on file   Highest education level: Not on  file  Occupational History   Not on file  Social Needs   Financial resource strain: Not on file   Food insecurity    Worry: Not on file    Inability: Not on file   Transportation needs    Medical: Not on file    Non-medical: Not on file  Tobacco Use   Smoking status: Never Smoker   Smokeless tobacco: Never Used  Substance and Sexual Activity   Alcohol use: No   Drug use: No   Sexual activity: Not Currently  Lifestyle   Physical activity    Days per week: Not on file    Minutes per session: Not on file   Stress: Not on file  Relationships   Social connections    Talks on phone: Not on file    Gets together: Not on file    Attends religious service: Not on file    Active member of club or organization: Not on file    Attends meetings of clubs or organizations: Not on file    Relationship status: Not on file   Intimate partner violence    Fear of current or  ex partner: Not on file    Emotionally abused: Not on file    Physically abused: Not on file    Forced sexual activity: Not on file  Other Topics Concern   Not on file  Social History Narrative   Lives in [place] with wife.  Retired Company secretary.  Walks 1.42mi daily.  Does not work 2/2 back problems.    Past Surgical History:  Procedure Laterality Date   ABDOMINAL AORTAGRAM N/A 05/13/2014   Procedure: ABDOMINAL AORTAGRAM;  Surgeon: Wellington Hampshire, MD;  Location: [facility] CATH LAB;  Service: Cardiovascular;  Laterality: N/A;   ANTERIOR CERVICAL DECOMP/DISCECTOMY FUSION N/A 07/28/2014   Procedure: ANTERIOR CERVICAL DECOMPRESSION/DISCECTOMY FUSION CERVICAL FOUR-FIVE CERVICAL FIVE-SIX ;  Surgeon: Earnie Larsson, MD;  Location: [facility] NEURO ORS;  Service: Neurosurgery;  Laterality: N/A;   APPENDECTOMY     arthroscopy right knee     BACK SURGERY     CARDIAC CATHETERIZATION N/A 03/09/2015   Procedure: Left Heart Cath and Cors/Grafts Angiography;  Surgeon: [name] Sine, MD;  Location: [facility] CV LAB;  Service: Cardiovascular;  Laterality: N/A;   CHOLECYSTECTOMY     CORONARY ARTERY BYPASS GRAFT     X 4 2009 (LIMA to LAD, SVG to first cicumflex marginal branch with sequential SVG to second cicumflex marginal branch  and saphenous vein to posterior descending coronary artery with endoscopic,vein harvest rt. lower exttremity by Dr.Owen on March 31,2009   CYST REMOVED     FROM SPINE   CYSTOURETHROSCOPY     right retrograde pyelogram,manipulate stone in the renal pelvis, rt. double-j catheter.   EYE SURGERY     KNEE SURGERY     rt knee   LEFT HEART CATH AND CORS/GRAFTS ANGIOGRAPHY N/A 02/20/2017   Procedure: LEFT HEART CATH AND CORS/GRAFTS ANGIOGRAPHY;  Surgeon: Leonie Man, MD;  Location: [facility] CV LAB;  Service: Cardiovascular;  Laterality: N/A;   LEFT HEART CATH AND CORS/GRAFTS ANGIOGRAPHY N/A 05/28/2018   Procedure: LEFT HEART CATH AND CORS/GRAFTS ANGIOGRAPHY;  Surgeon: Burnell Blanks, MD;  Location: [facility] CV LAB;  Service: Cardiovascular;  Laterality: N/A;   NEPHROLITHOTOMY     PERCUTANEOUS   PARS PLANA VITRECTOMY     lt. eye,retinal photocoagulation lt. eye, membrane peel lt. eye.   PERCUTANEOUS STENT INTERVENTION Left 05/13/2014   Procedure: PERCUTANEOUS STENT  INTERVENTION;  Surgeon: Wellington Hampshire, MD;  Location[facility] CATH LAB;  Service: Cardiovascular;  Laterality: Left;  COMMON ILLIAC   re-exploratin of laminectomy  05/2008   (RT L2-3) WITH REDO MICRODISKECTOMY      amLODipine  10 mg Oral Daily   atorvastatin  20 mg Oral QHS   insulin aspart  0-9 Units Subcutaneous TID WC   insulin glargine  9 Units Subcutaneous QHS   isosorbide mononitrate  30 mg Oral Daily   lisinopril  20 mg Oral Daily   Melatonin  3 mg Oral QHS   pantoprazole  40 mg Oral Daily   potassium chloride  40 mEq Oral BID   sucralfate  1 g Oral TID WC & HS   tamsulosin  0.4 mg Oral QPC supper    heparin 1,200 Units/hr (10/18/18 2115)    Physical Exam: Blood pressure 138/67, pulse (!) 59, temperature 97.7 F (36.5 C), temperature source Oral, resp. rate (!) 22, height 5\' 2"  (1.575 m), weight 68.5 kg, SpO2 100 %.    Affect appropriate Chronically ill thin white male  HEENT: normal Neck supple with no adenopathy JVP normal bilateral  bruits no thyromegaly Lungs clear with no wheezing and good diaphragmatic motion Heart:  S1/S2 SEM  murmur, no rub, gallop or click PMI normal Abdomen: benighn, BS positve, no tenderness, no AAA no bruit.  No HSM or HJR Distal pulses intact with no bruits No edema Neuro non-focal Skin warm and dry No muscular weakness   Labs:   Lab Results  Component Value Date   WBC 9.4 10/19/2018   HGB 12.6 (L) 10/19/2018   HCT 36.9 (L) 10/19/2018   MCV 92.0 10/19/2018   PLT 262 10/19/2018    Recent Labs  Lab 10/18/18 1538 10/19/18 0227  NA 140 137  K 3.7 3.2*  CL 101 102  CO2 29 27  BUN 18 13  CREATININE 0.78 0.76    CALCIUM 9.5 8.8*  PROT 7.4  --   BILITOT 0.7  --   ALKPHOS 72  --   ALT 23  --   AST 19  --   GLUCOSE 153* 242*   Lab Results  Component Value Date   CKTOTAL 64 05/05/2011   CKMB 1.8 05/05/2011   TROPONINI <0.03 07/20/2018    Lab Results  Component Value Date   CHOL 148 10/19/2018   CHOL 138 02/21/2017   CHOL 164 03/09/2015   Lab Results  Component Value Date   HDL 43 10/19/2018   HDL 43 02/21/2017   HDL 46 03/09/2015   Lab Results  Component Value Date   LDLCALC 90 10/19/2018   LDLCALC 86 02/21/2017   LDLCALC 98 03/09/2015   Lab Results  Component Value Date   TRIG 73 10/19/2018   TRIG 44 02/21/2017   TRIG 101 03/09/2015   Lab Results  Component Value Date   CHOLHDL 3.4 10/19/2018   CHOLHDL 3.2 02/21/2017   CHOLHDL 3.6 03/09/2015   No results found for: LDLDIRECT    Radiology: Dg Chest 2 View  Result Date: 10/19/2018 CLINICAL DATA:  Chest pain, blurred vision, and nausea. EXAM: CHEST - 2 VIEW COMPARISON:  10/16/2018 FINDINGS: The heart size and mediastinal contours are within normal limits. Prior CABG again noted. Chronic elevation of right hemidiaphragm is again seen. No evidence of pulmonary infiltrate or edema. No evidence of pleural effusion. Both lungs are clear. The visualized skeletal structures are unremarkable. IMPRESSION: Stable exam. No active cardiopulmonary disease. Electronically Signed  By: Marlaine Hind M.D.   On: 10/19/2018 07:53   Dg Chest 2 View  Addendum Date: 10/16/2018   ADDENDUM REPORT: 10/16/2018 16:10 ADDENDUM: Additional right middle lobe atelectasis versus airspace consolidation noted. Electronically Signed   By: Fidela Salisbury M.D.   On: 10/16/2018 16:10   Result Date: 10/16/2018 CLINICAL DATA:  Shortness of breath. Dyspnea on exertion. EXAM: CHEST - 2 VIEW COMPARISON:  July 20, 2018 FINDINGS: Cardiomediastinal silhouette is normal. Mediastinal contours appear intact. Stable postsurgical changes from CABG. Stable elevation of the  right hemidiaphragm. There is no evidence of pleural effusion or pneumothorax. Right lower lobe atelectasis versus less likely airspace consolidation. Osseous structures are without acute abnormality. Soft tissues are grossly normal. IMPRESSION: Right lower lobe atelectasis versus less likely airspace consolidation. Electronically Signed: By: Fidela Salisbury M.D. On: 10/16/2018 15:27   Ct Head Wo Contrast  Result Date: 10/18/2018 CLINICAL DATA:  Intracranial hemorrhage, known, follow up. Patient now has pulmonary embolus/DVT. Scan to re-evaluate bleeding prior to determining anticoagulation. EXAM: CT HEAD WITHOUT CONTRAST TECHNIQUE: Contiguous axial images were obtained from the base of the skull through the vertex without intravenous contrast. COMPARISON:  07/23/2018 and earlier FINDINGS: Brain: There is central and cortical atrophy. Periventricular white matter changes are consistent with small vessel disease. There is no intra or extra-axial fluid collection or mass lesion. The basilar cisterns and ventricles have a normal appearance. There is no CT evidence for acute infarction or hemorrhage. Layering blood in the dependent aspects of the LATERAL ventricles has resolved since the prior studies. Vascular: There is atherosclerotic calcifications of the internal carotid arteries. No hyperdense vessels. Skull: Normal. Negative for fracture or focal lesion. Sinuses/Orbits: There is mucoperiosteal thickening of the paranasal sinuses. Air-fluid level or mucosal thickening are within the RIGHT sphenoid air cell, stable in appearance. No mastoid effusions. Other: None. IMPRESSION: 1. Resolved layering blood products within the LATERAL ventricles. 2. No evidence for acute intracranial abnormality. 3. Atrophy and small vessel disease. 4. Chronic sinusitis. Electronically Signed   By: Nolon Nations M.D.   On: 10/18/2018 19:34   Nm Pulmonary Perfusion  Addendum Date: 10/17/2018   ADDENDUM REPORT: 10/17/2018  12:32 ADDENDUM: Findings conveyed toMICHAEL WERT on 10/17/2018  at12:15. Electronically Signed   By: Suzy Bouchard M.D.   On: 10/17/2018 12:32   Result Date: 10/17/2018 CLINICAL DATA:  Short of breath. COPD, asthma. Chest pain on exertion EXAM: NUCLEAR MEDICINE PERFUSION LUNG SCAN TECHNIQUE: Perfusion images were obtained in multiple projections after intravenous injection of radiopharmaceutical. RADIOPHARMACEUTICALS:  1.4 mCi Tc-51m MAA COMPARISON:  Radiograph 10/16/2018, CT PA 07/09/2018. FINDINGS: There several very small peripheral perfusion defects within the LEFT lower lobe (approximately 3). Potential 1 small defect in the RIGHT lower lobe. No clear corresponding findings on comparison radiograph. There is chronic elevation of the RIGHT hemidiaphragm on the radiograph. No pulmonary emboli identified on the comparison CTA 07/09/2018 IMPRESSION: Several small peripheral perfusion defects primary in the LEFT lower lobe suggest small distal pulmonary emboli. Findings could represent chronic or acute pulmonary emboli. Electronically Signed: By: Suzy Bouchard M.D. On: 10/17/2018 12:00   Vas [place] Lower Extremity Venous (dvt)  Result Date: 10/18/2018  Lower Venous Study Indications: Pain, SOB, and D Dimer. Patient complains of shortness of breath for the past two weeks. He has some tenderness in the left calf. Other Indications: Abnormal VQ scan.  Performing Technologist: Wilkie Aye RVT  Examination Guidelines: A complete evaluation includes B-mode imaging, spectral Doppler, color Doppler, and power Doppler as  needed of all accessible portions of each vessel. Bilateral testing is considered an integral part of a complete examination. Limited examinations for reoccurring indications may be performed as noted.  +---------+---------------+---------+-----------+----------+--------------+  RIGHT     Compressibility Phasicity Spontaneity Properties Thrombus Aging   +---------+---------------+---------+-----------+----------+--------------+  CFV       Full            Yes       Yes                                    +---------+---------------+---------+-----------+----------+--------------+  SFJ       Full            Yes       Yes                                    +---------+---------------+---------+-----------+----------+--------------+  FV Prox   Full            Yes       Yes                                    +---------+---------------+---------+-----------+----------+--------------+  FV Mid    Full            Yes       Yes                                    +---------+---------------+---------+-----------+----------+--------------+  FV Distal Full            Yes       Yes                                    +---------+---------------+---------+-----------+----------+--------------+  PFV       Full                                                             +---------+---------------+---------+-----------+----------+--------------+  POP       Full            Yes       Yes                                    +---------+---------------+---------+-----------+----------+--------------+  PTV       Full            Yes       Yes                                    +---------+---------------+---------+-----------+----------+--------------+  PERO      Full            Yes       Yes                                    +---------+---------------+---------+-----------+----------+--------------+  Gastroc   Full                                                             +---------+---------------+---------+-----------+----------+--------------+  +---------+---------------+---------+-----------+----------+--------------+  LEFT      Compressibility Phasicity Spontaneity Properties Thrombus Aging  +---------+---------------+---------+-----------+----------+--------------+  CFV       Full            Yes       Yes                                     +---------+---------------+---------+-----------+----------+--------------+  SFJ       Full            Yes       Yes                                    +---------+---------------+---------+-----------+----------+--------------+  FV Prox   Full            Yes       Yes                                    +---------+---------------+---------+-----------+----------+--------------+  FV Mid    Full            Yes       Yes                                    +---------+---------------+---------+-----------+----------+--------------+  FV Distal Full            Yes       Yes                                    +---------+---------------+---------+-----------+----------+--------------+  PFV       Full                                                             +---------+---------------+---------+-----------+----------+--------------+  POP       Full            Yes       Yes                                    +---------+---------------+---------+-----------+----------+--------------+  PTV       Full            Yes       Yes                                    +---------+---------------+---------+-----------+----------+--------------+  PERO      None  No        No          dilated    Acute           +---------+---------------+---------+-----------+----------+--------------+  Gastroc   Full                                                             +---------+---------------+---------+-----------+----------+--------------+  GSV       Full            Yes       Yes                                    +---------+---------------+---------+-----------+----------+--------------+  Findings reported to Dr Melvyn Novas at 1:30 pm. Patient instructed to go to [facility] emergency room for treatment.  Summary: Right: No evidence of deep vein thrombosis in the lower extremity. No indirect evidence of obstruction proximal to the inguinal ligament. No cystic structure found in the popliteal fossa. Left: Findings consistent with acute deep  vein thrombosis involving the left peroneal veins. No cystic structure found in the popliteal fossa. All other veins visualized appear fully compressible and demonstrate appropriate Doppler characteristics.  *See table(s) above for measurements and observations. Electronically signed by Larae Grooms MD on 10/18/2018 at 6:11:51 PM.    Final     EKG: SR poor R wave progression old anterior MI no acute changes    ASSESSMENT AND PLAN:   1. CAD: patent grafts by cath 05/2018 with distal native vessel disease not amenable to any intervention. Pain likely GI or related to PE we have tried him on [place] nitrates imdur and Rannexa for angina and this actually gave him chest pain will change to SA isordil low dose 5 bid and see if this helps No indication for repeat stress testing or cath  2. PE:  With LLE DVT continue xarelto for this and PE  3. PVD:  Stable no claudication   4. HLD:  On statin   Patient has had FTT and sedentary which likely resulted in his DVT/PE anticoagulation not without risk as he has had frequent falls and SAH. May  Need rehab. Can consider IVC filter if he has issues with anticoagulation  Signed: Jenkins Rouge 10/19/2018, 1:50 PM

## 2018-10-19 NOTE — Discharge Instructions (Addendum)
You were admitted because you developed a blood clot in your leg, and there was evidence that you have blood clots in your lungs as well.  We made sure that it was safe for you to take a blood thinner because of your recent head bleed, and the brain doctors cleared you for this.  We are starting a medication called Eliquis, which you will need to take in the morning and in the evening every day.  This medication can increase your bleeding risk, but it may prevent more blood clots in your lungs, which can be fatal.  We have also ordered physical therapy to visit you at home to help you get stronger and decrease your risk of falling.  Eliquis can be expensive, so employees of your insurance company will likely contact you within the next few days to help you afford this medication.  We are also giving you a coupon that will get you a month of this medication for free.  Please use this coupon today when picking up your prescription and start taking Eliquis tomorrow morning.  Please take Eliquis 2 pills twice daily for the first week followed by 1 pill twice per day after that.  You will need to continue this medication until your primary doctor says that it is safe to stop.  You had some chest pain while in the hospital, so we did several tests, and cardiology came to evaluate you.  They did not think that your chest pain was from your heart, and they switched your Imdur for a similar medication called Isordil.  Please take Isordil twice daily and stop Imdur.  All of your other medications should be the same.  Thank you for allowing [place] to take care of you.  ================================================================================================================== Information on my medicine - ELIQUIS (apixaban)  This medication education was reviewed with me or my healthcare representative as part of my discharge preparation.  The pharmacist that spoke with me during my hospital stay was:  Maryan Char  Lippucci, RPH  Why was Eliquis prescribed for you? Eliquis was prescribed to treat blood clots that may have been found in the veins of your legs (deep vein thrombosis) or in your lungs (pulmonary embolism) and to reduce the risk of them occurring again.  What do You need to know about Eliquis ? The starting dose is 10 mg (two 5 mg tablets) taken TWICE daily for the FIRST SEVEN (7) DAYS, then on the evening of 10/26/2018  the dose is reduced to ONE 5 mg tablet taken TWICE daily.  Eliquis may be taken with or without food.   Try to take the dose about the same time in the morning and in the evening. If you have difficulty swallowing the tablet whole please discuss with your pharmacist how to take the medication safely.  Take Eliquis exactly as prescribed and DO NOT stop taking Eliquis without talking to the doctor who prescribed the medication.  Stopping may increase your risk of developing a new blood clot.  Refill your prescription before you run out.  After discharge, you should have regular check-up appointments with your healthcare provider that is prescribing your Eliquis.    What do you do if you miss a dose? If a dose of ELIQUIS is not taken at the scheduled time, take it as soon as possible on the same day and twice-daily administration should be resumed. The dose should not be doubled to make up for a missed dose.  Important Safety Information A possible  side effect of Eliquis is bleeding. You should call your healthcare provider right away if you experience any of the following: ? Bleeding from an injury or your nose that does not stop. ? Unusual colored urine (red or dark brown) or unusual colored stools (red or black). ? Unusual bruising for unknown reasons. ? A serious fall or if you hit your head (even if there is no bleeding).  Some medicines may interact with Eliquis and might increase your risk of bleeding or clotting while on Eliquis. To help avoid this, consult  your healthcare provider or pharmacist prior to using any new prescription or non-prescription medications, including herbals, vitamins, non-steroidal anti-inflammatory drugs (NSAIDs) and supplements.  This website has more information on Eliquis (apixaban): http://www.eliquis.com/eliquis/home

## 2018-10-19 NOTE — Progress Notes (Signed)
[place] for heparin Indication: pulmonary embolus/DVT  Heparin Dosing Weight: 68.3 kg  Labs: Recent Labs    10/16/18 1039 10/18/18 1538 10/19/18 0227  HGB 14.3 14.1 12.6*  HCT 41.3 41.8 36.9*  PLT 290.0 306 262  APTT  --  25  --   LABPROT  --  13.5  --   INR  --  1.0  --   HEPARINUNFRC  --   --  0.37  CREATININE 0.83 0.78 0.76    Assessment: 6 yom presenting with slightly elevated d-dimer on 9/2 and SOB, found to have acute LLE DVT/PE and now presenting to the ER. Pharmacy consulted to dose heparin for VTE. Not on anticoagulation PTA.  Patient noted with hx of falls s/p IVH bilaterally with small amount of SAH on 07/20/18. Neurosurgery advised conservative care with f/u CT unchanged that admission. This admission, ED PA Wyn Quaker discussed with Neurosurgery PA Joelene Millin), who stated ok to anticoagulate this patient for PE from their standpoint. CBC wnl stable. No active bleed issues documented.  Heparin level 0.37 units/ml Hg 12.6, PTLC 262  Goal of Therapy:  Heparin level 0.3-0.7 units/ml Monitor platelets by anticoagulation protocol: Yes   Plan:  Continue heparin at 1200 units/hr Check heparin level in 6-8 hours to confirm  Thanks for allowing pharmacy to be a part of this patient's care.  Excell Seltzer, PharmD Clinical Pharmacist 10/19/2018 3:45 AM

## 2018-10-19 NOTE — Progress Notes (Signed)
Family Medicine Teaching Service Daily Progress Note Intern Pager: [phone]  Patient name: Ricardo Le Medical record number: [id number] Date of birth: 05-01-1940 Age: 78 y.o. Gender: male  Primary Care Provider: Angelina Sheriff, MD Consultants: None Code Status: Full  Pt Overview and Major Events to Date:  09/04 Admitted. 09/05 Chest pain.  Assessment and Plan:  Chest pain 2/2 unstable angina. CABG x4 2013 Did not endorse chest pain on admission. New chest pain onset 09/05 0500. I was paged at [facility]. Patient endorsed chest pain and chest tightness that initially was 5 out of 10. He said he was a sleep and the chest pain and abdominal pain woke him up and made him breathe fast.He also says his vision has become more blurry since being in the ED. His exam was benign expcept for chest pain reproducible with increase grip strength test. BP 106/55, HR 68, RR 22. He was placed on 2L [place] with some improvement. SL nitro was administered and brought the abdominal pain to a 3 out 10. His abdominal pain is still present, compazine given for nausea improvement. His last bowel movement was 2 days ago now. No acute abdomen present.  -Continue Imdur -BMP K 3.2, Ca 8.8, CBC H/H 12.6/36.9; Repleted potassium 51mEq x2  -Troponin 10>9 ; obtain another level f/u -CXR ordered f/u -Continue to monitor -Consider consulting cardiology  PE with DVT He was evaluated by pulmonology 2 days ago due to dyspnea on exertion, orthopnea, and intermittent left leg pain. An elevated D-dimer resulted at 1.30 on 9/2. A DVT study found a left peroneal vein thrombus. VQ scan was obtained as an outpatient due to IV contrast allergy,which showed several small peripheral perfusion defects primarily in the left lower lobe suggestive of small distal pulmonary emboli. This morning he is experiencing some shortness of breath as well as tachypnea with chest pain, all of which were not present during admission -Continue Heparin,  Heparin level 0.37 -Switch to DOAC therapy in the am - F/U on new CXR reorder this am. Review previous below.  -VQ scan: Several small peripheral perfusion defects primary in the LLL suggest small distal pulmonary emboli. Findings could represent chronic or acute pulmonary emboli. 09/03. - BMP K 3.2, Ca 8.8, CBC H/H 12.6/36.9; Repleted potassium 88mEq x2  - PT/OT eval and treat  Subarachnoid hemorrage 2/2 fall with head injury 07/20/2018 Presented 07/20/2018 with headache and altered mental status. No additional falls noted since most last event 07/08/2018. Cranial CT scan showed intraventricular hemorrhage involving bilateral lateral ventricles, left side greater than right. There was also concern for small amount of subarachnoid blood in the left frontal lobe. Neurosurgery Dr. Sherley Bounds consulted with conservative care with follow-up cranial CT scan unchanged no new site of hemorrhage. No hydrocephalus and no mass-effect. Patient had been on aspirin prior to admission remained on hold due to hemorrhage. Cranial CT scan completed 07/23/2018 showing no newacute changes.  -per neurosurgery, ok to anticoagulate -Refer to new imaging below  Fracture acetabulum-closed 2/2 to recent fall Patient with recent admission 07/08/2018 to 07/11/2018 after recent fall sustaining a left acetabular fracture that was nonoperative followed by Dr. Edmonia Lynch orthopedic services [place] weightbearing. -PT/OT to eval and treat  HOH 2/2 to recent fall Noted gradual hearing loss post fall (07/08/2018) recommendations have been made for ENT follow-up as outpatient. Lt. Ear is noted to better than the right.   GERD  Home meds is Nexium 40mg  BID -Start Protonix 40mg  daily as is on formulary  Nephrolithiasis Denies dysuria and frequency on admission. Home med is Tamsulosin 0.4mg   -Continue tamsulosin -Cr 0.76, WBC 9.4, K 3.2, Ca 8.8 -CMP, CBC am  HLD Last lipid panel 02/21/2017 Tot chol 138, HDL  43, LDL 86. Hx of statin intolerance. Home med is atorvastatin. -Continue atorvastatin -Lipid panel 09/05. Chol 148. HDL 43. LDL 90  T2DM Insulin dependent. A1c 7.2 a few weeks ago per patient. Home meds are Glipizide 2.5 mg BID and Lantus 18 units HS. Glucose on BMP is 242. -Hold glipizide, discontinued during last admission -Start Lantus 9 units -SSSI -CBG monitoring QID, AC & HS  HTN  BP 1106/55. Home meds are Amlodipine 10mg  and Lisinopril 20mg  -Continue amlodipine and lisinopril  Constipation Last bowel movement was yesterday. On admission he was endorsing general abdominal pain with no guarding or tenderness. NABS present in all 4 quadranst. Home meds are Dulcolax 5mg  TID, Colace 100mg  BID, Senokot 8.6mg , and Carafate. -Hold home meds -Start Miralax daily PRN, titrate as needed   PAD  Most recent vascular studies in October 2019 showed patent left common iliac artery stent. ABI was 0.7 on the right and 0.84 on the left. 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 He said he was a sleep and the chest pain and abdominal pain woke him up and made him breathe fast. He also says his vision has become more blurry since being in the ED. His exam was benign expcept for chest pain reproducible with increase grip strength test.He was placed on 2L [place] with some improvement. SL nitro was administered and brought the abdominal pain to a 3 out 10. His abdominal pain is still present, compazine given for nausea improvement. His last bowel movement was 2 days ago now. No acute abdomen present.     Objective: Temp:  [97.7 F (36.5 C)-98.8 F (37.1 C)] 97.7 F (36.5 C) (09/05 0535) Pulse Rate:  [56-72] 68 (09/05 0600) Resp:  [16-27] 22 (09/05 0535) BP: (106-170)/(55-122) 106/55 (09/05 0600) SpO2:  [96 %-99 %] 99 % (09/05 0600) Weight:  [68.5 kg] 68.5 kg (09/04 1431)   Physical Exam:  General: Appears well, no acute distress. Age appropriate. Cardiac: RRR, normal heart sounds, no murmurs. Chest pain reproducible with grip strength test Respiratory: CTAB, mildly increase effort Abdomen: soft, mildly tender, nondistended Extremities: No edema or cyanosis. Skin: Warm and dry, no rashes noted Neuro: alert and oriented, no focal deficits. Direct and consensual light reflex present. PERRL. EOMI. Psych: normal affect   Laboratory: Recent Labs  Lab 10/16/18 1039 10/18/18 1538 10/19/18 0227  WBC 8.7 10.6* 9.4  HGB 14.3 14.1 12.6*  HCT 41.3 41.8 36.9*  PLT 290.0 306 262   Recent Labs  Lab 10/16/18 1039 10/18/18 1538 10/19/18 0227  NA 139 140 137  K 3.9 3.7 3.2*  CL 98 101 102  CO2 32 29 27  BUN 17 18 13   CREATININE 0.83 0.78 0.76  CALCIUM 9.7 9.5 8.8*  PROT  --  7.4  --   BILITOT  --  0.7  --   ALKPHOS  --  72  --   ALT  --  23  --  AST  --  19  --   GLUCOSE 184* 153* 242*   Heparin level (unfractionated) [id number] Collected: 10/19/18 0227  Specimen: Blood Updated: 10/19/18 0329   Heparin Unfractionated 0.37 IU/mL   Lipid panel [id number] Collected: 10/19/18 0227  Specimen: Blood Updated: 10/19/18 0329   Cholesterol 148 mg/dL    Triglycerides 73 mg/dL    HDL 43 mg/dL    Total CHOL/HDL Ratio 3.4 RATIO    VLDL 15 mg/dL    LDL Cholesterol 90 mg/dL     Imaging/Diagnostic Tests: EKG: Result date 10/19/2018 Normal sinus rhythm Left axis deviation Pulmonary disease pattern Septal infarct , age undetermined Abnormal ECG  EKG: Result date 10/18/2018 Sinus rhythm Left anterior fascicular block Probable anteroseptal infarct, old (Same as last EKG  on 07/20/2018)  CHEST - 2 VIEW Result date 10/16/2018 COMPARISON: July 20, 2018 IMPRESSION: Right lower lobe atelectasis versus less likely airspace consolidation.  NUCLEAR MEDICINE PERFUSION LUNG SCAN Result date 10/17/2018 COMPARISON: Radiograph 10/16/2018, CT PA 07/09/2018. IMPRESSION: Several small peripheral perfusion defects primary in the LEFT lower lobe suggest small distal pulmonary emboli. Findings could represent chronic or acute pulmonary emboli.  CT HEAD WITHOUT CONTRAST Result date 10/18/2018 COMPARISON: 07/23/2018 and earlier IMPRESSION: 1. Resolved layering blood products within the LATERAL ventricles. 2. No evidence for acute intracranial abnormality. 3. Atrophy and small vessel disease. 4. Chronic sinusitis.  Gerlene Fee, DO 10/19/2018, 7:02 AM PGY-1, [place] Intern pager: [phone], text pages welcome

## 2018-10-19 NOTE — Progress Notes (Signed)
Seen and examined by Dr. Janus Molder and ordered stat chest Xray. Will continue to monitor pt.

## 2018-10-19 NOTE — Progress Notes (Addendum)
C/O midsternal epigastric pain 8/10 while sitting up in chair at bedside. Returned back to bed. EKG done. Pt states he feels like it could be "indigestion". Maalox ordered from [employer]. Resident coverage notified. Home Med carafate ordered.

## 2018-10-19 NOTE — Evaluation (Signed)
Physical Therapy Evaluation Patient Details Name: Ricardo Le MRN: [id number] DOB: 1940/03/04 Today's Date: 10/19/2018   History of Present Illness  Ricardo Le is a 78 y.o. male presenting with weakness and abnormal labs and imgaing secondary to a confirmed PE and DVT. PMH is significant for recent subarachnoid hemorrage w/ head injury and fall, unstable angina, PAD, CABG x4 T2DM, Nephrolithiasis, HLD, Consitpation, Pancreatic cancer, DDD, GERD.  Clinical Impression  Patient presents with decreased independence with mobility due to deficits listed in PT problem list.  Currently minguard A without assistive device for hallway ambulation, reports was ambulating independent at home.  Feel at risk for falls with history of falls with injury this year and now home likely on anticoagulation.  Recommend HHPT, family assist for mobility and using walker vs cane to reduce fall risk.  PT to follow acutely.     Follow Up Recommendations Home health PT;Supervision/Assistance - 24 hour    Equipment Recommendations  None recommended by PT    Recommendations for Other Services       Precautions / Restrictions Precautions Precautions: Fall      Mobility  Bed Mobility Overal bed mobility: Modified Independent                Transfers Overall transfer level: Needs assistance   Transfers: Sit to/from Stand Sit to Stand: Min guard         General transfer comment: for safety  Ambulation/Gait Ambulation/Gait assistance: Min guard Gait Distance (Feet): 300 Feet Assistive device: None Gait Pattern/deviations: Step-through pattern;Decreased stride length;Shuffle;Trunk flexed     General Gait Details: reports floor hard under his feet, usually wears bedroom shoes or tennis shoes, limited trunk rotation and arm swing, but at times pt attempting to increase arm swing as if he remembers from prior therapy.  Stairs            Wheelchair Mobility    Modified Rankin  (Stroke Patients Only)       Balance Overall balance assessment: Needs assistance   Sitting balance-Leahy Scale: Good       Standing balance-Leahy Scale: Good Standing balance comment: able to walk without device, but obvious balance/gait deficits                             Pertinent Vitals/Pain Pain Assessment: No/denies pain    Home Living Family/patient expects to be discharged to:: Private residence Living Arrangements: Spouse/significant other;Children Available Help at Discharge: Family;Available 24 hours/day Type of Home: House Home Access: Stairs to enter Entrance Stairs-Rails: None Entrance Stairs-Number of Steps: 1 Home Layout: One level Home Equipment: Bedside commode;Walker - 2 wheels;Shower seat - built in;Grab bars - tub/shower;Hand held shower head;Cane - single point      Prior Function Level of Independence: Independent         Comments: reports began walking without device about 2-3 weeks ago     Hand Dominance        Extremity/Trunk Assessment   Upper Extremity Assessment Upper Extremity Assessment: Generalized weakness    Lower Extremity Assessment Lower Extremity Assessment: Generalized weakness    Cervical / Trunk Assessment Cervical / Trunk Assessment: Kyphotic  Communication   Communication: HOH  Cognition Arousal/Alertness: Awake/alert Behavior During Therapy: WFL for tasks assessed/performed Overall Cognitive Status: No family/caregiver present to determine baseline cognitive functioning  General Comments: slightly slow processing, but also HOH with recent IVH/SAH      General Comments General comments (skin integrity, edema, etc.): Educated on safety at home with using device for balance and having family close during mobility especially as he may need to go home on blood thinner due to DVT/PE    Exercises     Assessment/Plan    PT Assessment Patient needs  continued PT services  PT Problem List Decreased mobility;Decreased balance;Decreased safety awareness;Decreased knowledge of use of DME       PT Treatment Interventions DME instruction;Therapeutic activities;Balance training;Therapeutic exercise;Functional mobility training;Gait training;Patient/family education    PT Goals (Current goals can be found in the Care Plan section)  Acute Rehab PT Goals Patient Stated Goal: to go home PT Goal Formulation: With patient Time For Goal Achievement: 11/02/18 Potential to Achieve Goals: Good    Frequency Min 3X/week   Barriers to discharge        Co-evaluation               AM-PAC PT "6 Clicks" Mobility  Outcome Measure Help needed turning from your back to your side while in a flat bed without using bedrails?: None Help needed moving from lying on your back to sitting on the side of a flat bed without using bedrails?: None Help needed moving to and from a bed to a chair (including a wheelchair)?: A Little Help needed standing up from a chair using your arms (e.g., wheelchair or bedside chair)?: A Little Help needed to walk in hospital room?: A Little Help needed climbing 3-5 steps with a railing? : A Little 6 Click Score: 20    End of Session Equipment Utilized During Treatment: Gait belt Activity Tolerance: Patient tolerated treatment well Patient left: in chair;with call bell/phone within reach;with chair alarm set   PT Visit Diagnosis: Other abnormalities of gait and mobility (R26.89);Muscle weakness (generalized) (M62.81)    Time: [id number] PT Time Calculation (min) (ACUTE ONLY): 23 min   Charges:   PT Evaluation $PT Eval Low Complexity: 1 Low PT Treatments $Gait Training: 8-22 mins        Magda Kiel, [place] [facility] [phone] 10/19/2018   Reginia Naas 10/19/2018, 10:55 AM

## 2018-10-19 NOTE — Care Management CC44 (Signed)
Condition Code 44 Documentation Completed  Patient Details  Name: LORENE FERRARI MRN: [id number] Date of Birth: Dec 21, 1940   Condition Code 44 given:  Yes Patient signature on Condition Code 44 notice:  Yes Documentation of 2 MD's agreement:  Yes Code 44 added to claim:  Yes    Claudie Leach, RN 10/19/2018, 4:54 PM

## 2018-10-19 NOTE — Progress Notes (Signed)
Discharge order received. Rx for Eliquis sent to pt's pharmacy. Eliquis card given per TOC. [facility] consulted for med assistance after d/c. F/U instructions given to pt and wife. Verbalized understanding. No questions or concerns expressed.

## 2018-10-19 NOTE — TOC Transition Note (Signed)
Transition of Care [facility]) - CM/SW Discharge Note   Patient Details  Name: Ricardo Le MRN: [id number] Date of Birth: November 09, 1940  [facility] CM/SW Contact:  Claudie Leach, RN Phone Number: 10/19/2018, 4:07 PM   Clinical Narrative:    Pt to d/c home with [facility] PT and Eliquis.  Patient has not previously used [facility] and chooses [facility].  Referral called to Sharmon Revere with [facility].    Patient given 30 day free Eliquis card.  Per Dr. Shan Levans, patient's copay will be around $200/month.  She requests that [facility] help with patient's copay- consult entered.     Final next level of care: [place] Barriers to Discharge: No Barriers Identified   Patient Goals and CMS Choice Patient states their goals for this hospitalization and ongoing recovery are:: to get back home CMS Medicare.gov Compare Post Acute Care list provided to:: Patient Choice offered to / list presented to : Patient   Discharge Plan and Services     HH Arranged: PT [facility] Agency: [place] Date [place]: 10/19/18 Time [place]: [id number] Representative spoke with at [place]: Sharmon Revere

## 2018-10-24 DIAGNOSIS — Z23 Encounter for immunization: Secondary | ICD-10-CM | POA: Diagnosis not present

## 2018-10-24 DIAGNOSIS — Z79899 Other long term (current) drug therapy: Secondary | ICD-10-CM | POA: Diagnosis not present

## 2018-10-24 DIAGNOSIS — R5383 Other fatigue: Secondary | ICD-10-CM | POA: Diagnosis not present

## 2018-10-24 DIAGNOSIS — R5381 Other malaise: Secondary | ICD-10-CM | POA: Diagnosis not present

## 2018-10-24 DIAGNOSIS — I2699 Other pulmonary embolism without acute cor pulmonale: Secondary | ICD-10-CM | POA: Diagnosis not present

## 2018-10-24 DIAGNOSIS — E1165 Type 2 diabetes mellitus with hyperglycemia: Secondary | ICD-10-CM | POA: Diagnosis not present

## 2018-10-24 DIAGNOSIS — E785 Hyperlipidemia, unspecified: Secondary | ICD-10-CM | POA: Diagnosis not present

## 2018-10-24 DIAGNOSIS — Z6823 Body mass index (BMI) 23.0-23.9, adult: Secondary | ICD-10-CM | POA: Diagnosis not present

## 2018-11-05 DIAGNOSIS — I2699 Other pulmonary embolism without acute cor pulmonale: Secondary | ICD-10-CM | POA: Diagnosis not present

## 2018-11-05 DIAGNOSIS — Z7901 Long term (current) use of anticoagulants: Secondary | ICD-10-CM | POA: Diagnosis not present

## 2018-11-05 DIAGNOSIS — N400 Enlarged prostate without lower urinary tract symptoms: Secondary | ICD-10-CM | POA: Diagnosis not present

## 2018-11-05 DIAGNOSIS — R972 Elevated prostate specific antigen [PSA]: Secondary | ICD-10-CM | POA: Diagnosis not present

## 2018-11-05 DIAGNOSIS — Z9081 Acquired absence of spleen: Secondary | ICD-10-CM | POA: Diagnosis not present

## 2018-11-05 DIAGNOSIS — R911 Solitary pulmonary nodule: Secondary | ICD-10-CM | POA: Diagnosis not present

## 2018-11-05 DIAGNOSIS — Z8507 Personal history of malignant neoplasm of pancreas: Secondary | ICD-10-CM | POA: Diagnosis not present

## 2018-11-11 DIAGNOSIS — Z0000 Encounter for general adult medical examination without abnormal findings: Secondary | ICD-10-CM | POA: Diagnosis not present

## 2018-11-11 DIAGNOSIS — I10 Essential (primary) hypertension: Secondary | ICD-10-CM | POA: Diagnosis not present

## 2018-11-11 DIAGNOSIS — R109 Unspecified abdominal pain: Secondary | ICD-10-CM | POA: Diagnosis not present

## 2018-11-11 DIAGNOSIS — E1165 Type 2 diabetes mellitus with hyperglycemia: Secondary | ICD-10-CM | POA: Diagnosis not present

## 2018-11-11 DIAGNOSIS — R079 Chest pain, unspecified: Secondary | ICD-10-CM | POA: Diagnosis not present

## 2018-11-11 DIAGNOSIS — Z6824 Body mass index (BMI) 24.0-24.9, adult: Secondary | ICD-10-CM | POA: Diagnosis not present

## 2018-11-11 DIAGNOSIS — Z1331 Encounter for screening for depression: Secondary | ICD-10-CM | POA: Diagnosis not present

## 2018-11-11 DIAGNOSIS — Z9119 Patient's noncompliance with other medical treatment and regimen: Secondary | ICD-10-CM | POA: Diagnosis not present

## 2018-11-11 DIAGNOSIS — R0600 Dyspnea, unspecified: Secondary | ICD-10-CM | POA: Diagnosis not present

## 2018-11-13 DIAGNOSIS — K219 Gastro-esophageal reflux disease without esophagitis: Secondary | ICD-10-CM | POA: Diagnosis not present

## 2018-11-13 DIAGNOSIS — R1013 Epigastric pain: Secondary | ICD-10-CM | POA: Diagnosis not present

## 2018-11-13 DIAGNOSIS — K5909 Other constipation: Secondary | ICD-10-CM | POA: Diagnosis not present

## 2018-11-14 ENCOUNTER — Encounter (HOSPITAL_COMMUNITY): Payer: PPO

## 2018-11-19 ENCOUNTER — Other Ambulatory Visit: Payer: Self-pay | Admitting: Cardiovascular Disease

## 2018-11-21 DIAGNOSIS — M545 Low back pain: Secondary | ICD-10-CM | POA: Diagnosis not present

## 2018-11-22 DIAGNOSIS — E1165 Type 2 diabetes mellitus with hyperglycemia: Secondary | ICD-10-CM | POA: Diagnosis not present

## 2018-11-22 DIAGNOSIS — I10 Essential (primary) hypertension: Secondary | ICD-10-CM | POA: Diagnosis not present

## 2018-11-22 DIAGNOSIS — E7800 Pure hypercholesterolemia, unspecified: Secondary | ICD-10-CM | POA: Diagnosis not present

## 2018-12-04 DIAGNOSIS — L603 Nail dystrophy: Secondary | ICD-10-CM | POA: Diagnosis not present

## 2018-12-04 DIAGNOSIS — M2041 Other hammer toe(s) (acquired), right foot: Secondary | ICD-10-CM | POA: Diagnosis not present

## 2018-12-04 DIAGNOSIS — M2042 Other hammer toe(s) (acquired), left foot: Secondary | ICD-10-CM | POA: Diagnosis not present

## 2018-12-04 DIAGNOSIS — E119 Type 2 diabetes mellitus without complications: Secondary | ICD-10-CM | POA: Diagnosis not present

## 2018-12-05 ENCOUNTER — Ambulatory Visit (HOSPITAL_BASED_OUTPATIENT_CLINIC_OR_DEPARTMENT_OTHER)
Admission: RE | Admit: 2018-12-05 | Discharge: 2018-12-05 | Disposition: A | Discharge status: Home / Self Care | Payer: PPO | Source: Ambulatory Visit | Attending: Cardiovascular Disease | Admitting: Cardiovascular Disease

## 2018-12-05 ENCOUNTER — Other Ambulatory Visit (HOSPITAL_COMMUNITY): Payer: Self-pay | Admitting: Cardiovascular Disease

## 2018-12-05 ENCOUNTER — Telehealth: Payer: Self-pay | Admitting: Cardiovascular Disease

## 2018-12-05 ENCOUNTER — Ambulatory Visit (HOSPITAL_COMMUNITY)
Admission: RE | Admit: 2018-12-05 | Discharge: 2018-12-05 | Disposition: A | Discharge status: Home / Self Care | Payer: PPO | Source: Ambulatory Visit | Attending: Cardiology | Admitting: Cardiology

## 2018-12-05 ENCOUNTER — Other Ambulatory Visit: Payer: Self-pay

## 2018-12-05 DIAGNOSIS — I739 Peripheral vascular disease, unspecified: Secondary | ICD-10-CM

## 2018-12-05 DIAGNOSIS — R0989 Other specified symptoms and signs involving the circulatory and respiratory systems: Secondary | ICD-10-CM | POA: Diagnosis not present

## 2018-12-05 DIAGNOSIS — I779 Disorder of arteries and arterioles, unspecified: Secondary | ICD-10-CM

## 2018-12-05 DIAGNOSIS — I6523 Occlusion and stenosis of bilateral carotid arteries: Secondary | ICD-10-CM

## 2018-12-05 NOTE — Telephone Encounter (Signed)
Wife returning call in regards to testing results.

## 2018-12-05 NOTE — Telephone Encounter (Signed)
Patient's wife (DPR) is aware of patient's results. Per Dr. Johnsie Cancel, Plaque no stenosis f/u carotid duplex in 2 years. Will order carotid to be done in October 2022.

## 2018-12-09 ENCOUNTER — Telehealth: Payer: Self-pay | Admitting: Cardiovascular Disease

## 2018-12-09 NOTE — Telephone Encounter (Signed)
Tried to call patient twice, phone rang busy. Will try again later.

## 2018-12-09 NOTE — Telephone Encounter (Signed)
New message:    Patient wife calling stating that she need to speak with some one concering AORD . Please call patient wife back.

## 2018-12-11 NOTE — Telephone Encounter (Signed)
Tried to call patient back. Phone keeps ringing busy.

## 2018-12-11 NOTE — Telephone Encounter (Signed)
Follow up:   Patient wife returning call back from a few days ago. Please call patient wife back.

## 2018-12-12 NOTE — Telephone Encounter (Signed)
Left message on patient's cell # -- to contact  [address] office [phone].   Informed the other number stays busy.

## 2018-12-12 NOTE — Telephone Encounter (Signed)
° ° °  Spouse calling to report chest pain and SOB when walking, and groin pain.  Requesting order for MRI

## 2018-12-13 ENCOUNTER — Encounter (INDEPENDENT_AMBULATORY_CARE_PROVIDER_SITE_OTHER): Payer: PPO | Admitting: Ophthalmology

## 2018-12-13 ENCOUNTER — Other Ambulatory Visit: Payer: Self-pay

## 2018-12-13 DIAGNOSIS — E113593 Type 2 diabetes mellitus with proliferative diabetic retinopathy without macular edema, bilateral: Secondary | ICD-10-CM

## 2018-12-13 DIAGNOSIS — E11319 Type 2 diabetes mellitus with unspecified diabetic retinopathy without macular edema: Secondary | ICD-10-CM

## 2018-12-13 DIAGNOSIS — H35033 Hypertensive retinopathy, bilateral: Secondary | ICD-10-CM | POA: Diagnosis not present

## 2018-12-13 DIAGNOSIS — I10 Essential (primary) hypertension: Secondary | ICD-10-CM | POA: Diagnosis not present

## 2018-12-17 ENCOUNTER — Other Ambulatory Visit: Payer: Self-pay | Admitting: Family Medicine

## 2018-12-17 NOTE — Telephone Encounter (Signed)
Finally got in touch with patient. Patient stated he needs to see someone that he has been having chest pain and SOB when he gets up to walk. Patient stated he can barely do any thing. Informed patient that we had an opening tomorrow. Patient stated he could make the appointment.

## 2018-12-18 ENCOUNTER — Encounter: Payer: Self-pay | Admitting: Cardiovascular Disease

## 2018-12-18 ENCOUNTER — Ambulatory Visit (INDEPENDENT_AMBULATORY_CARE_PROVIDER_SITE_OTHER): Payer: PPO | Admitting: Cardiovascular Disease

## 2018-12-18 ENCOUNTER — Other Ambulatory Visit: Payer: Self-pay

## 2018-12-18 VITALS — BP 122/52 | HR 64 | Ht 62.0 in | Wt 159.0 lb

## 2018-12-18 DIAGNOSIS — R0600 Dyspnea, unspecified: Secondary | ICD-10-CM | POA: Diagnosis not present

## 2018-12-18 DIAGNOSIS — R079 Chest pain, unspecified: Secondary | ICD-10-CM

## 2018-12-18 LAB — BASIC METABOLIC PANEL
BUN/Creatinine Ratio: 22 (ref 10–24)
BUN: 20 mg/dL (ref 8–27)
CO2: 25 mmol/L (ref 20–29)
Calcium: 9.5 mg/dL (ref 8.6–10.2)
Chloride: 96 mmol/L (ref 96–106)
Creatinine, Ser: 0.92 mg/dL (ref 0.76–1.27)
GFR calc Af Amer: 92 mL/min/{1.73_m2} (ref >59)
GFR calc non Af Amer: 80 mL/min/{1.73_m2} (ref >59)
Glucose: 223 mg/dL — ABNORMAL HIGH (ref 65–99)
Potassium: 4.1 mmol/L (ref 3.5–5.2)
Sodium: 135 mmol/L (ref 134–144)

## 2018-12-18 LAB — CK: Total CK: 80 U/L (ref 41–331)

## 2018-12-18 LAB — CBC WITH DIFFERENTIAL/PLATELET
Basophils Absolute: 0.1 10*3/uL (ref 0.0–0.2)
Basos: 1 %
EOS (ABSOLUTE): 0.1 10*3/uL (ref 0.0–0.4)
Eos: 2 %
Hematocrit: 38.8 % (ref 37.5–51.0)
Hemoglobin: 13.4 g/dL (ref 13.0–17.7)
Immature Grans (Abs): 0 10*3/uL (ref 0.0–0.1)
Immature Granulocytes: 0 %
Lymphocytes Absolute: 1.9 10*3/uL (ref 0.7–3.1)
Lymphs: 23 %
MCH: 31.2 pg (ref 26.6–33.0)
MCHC: 34.5 g/dL (ref 31.5–35.7)
MCV: 90 fL (ref 79–97)
Monocytes Absolute: 1.2 10*3/uL — ABNORMAL HIGH (ref 0.1–0.9)
Monocytes: 15 %
Neutrophils Absolute: 4.9 10*3/uL (ref 1.4–7.0)
Neutrophils: 59 %
Platelets: 265 10*3/uL (ref 150–450)
RBC: 4.3 x10E6/uL (ref 4.14–5.80)
RDW: 12.1 % (ref 11.6–15.4)
WBC: 8.2 10*3/uL (ref 3.4–10.8)

## 2018-12-18 LAB — SEDIMENTATION RATE: Sed Rate: 18 mm/hr (ref 0–30)

## 2018-12-18 NOTE — Patient Instructions (Addendum)
Medication Instructions:   *If you need a refill on your cardiac medications before your next appointment, please call your pharmacy*  Lab Work: Your physician recommends that you have lab work today- CPK, SED RATE, CBC, and BMET  If you have labs (blood work) drawn today and your tests are completely normal, you will receive your results only by: [name] Kitchen MyChart Message (if you have MyChart) OR . A paper copy in the mail If you have any lab test that is abnormal or we need to change your treatment, we will call you to review the results.  Testing/Procedures: Your physician has requested that you have an echocardiogram. Echocardiography is a painless test that uses sound waves to create images of your heart. It provides your doctor with information about the size and shape of your heart and how well your heart's chambers and valves are working. This procedure takes approximately one hour. There are no restrictions for this procedure.  Follow-Up: At [facility], you and your health needs are our priority.  As part of our continuing mission to provide you with exceptional heart care, we have created designated Provider Care Teams.  These Care Teams include your primary Cardiologist (physician) and Advanced Practice Providers (APPs -  Physician Assistants and Nurse Practitioners) who all work together to provide you with the care you need, when you need it.  Your next appointment:   12 months  The format for your next appointment:   In Person  Provider:   You may see Jenkins Rouge, MD or one of the following Advanced Practice Providers on your designated Care Team:    Truitt Merle, NP  Cecilie Kicks, NP  Kathyrn Drown, NP

## 2018-12-18 NOTE — Progress Notes (Signed)
Cardiology Office Note   Date:  12/18/2018   ID:  Ricardo Le, DOB 09-01-1940, MRN [id number]  PCP:  Angelina Sheriff, MD  Cardiologist:  Dr. Johnsie Cancel  No chief complaint on file.     History of Present Illness: Ricardo Le is a 78 y.o. male who presents for a follow up visit regarding CAD/ PAD. He has known history of coronary artery disease status post coronary artery bypass grafting in 2009 .  Cardiac cath in 2017, 2019 and 2020 showed patent grafts with diffuse distal disease. He has angina but feels worse with Ranexa  Other past medical history include remote history of pancreatic cancer which was treated successfully. He is status post left common iliac artery stent placement in March 2016. He is known to have bilateral SFA occlusion with 1 vessel runoff below the knee bilaterally.  Most recent vascular studies in 11/2018 right ABI 0.93 left [place] carotids plaque no stenosis   Reviewed last cath done 05/28/18 patent SVG to RCA, patent SVG sequential OM's and patent lima to LAD with diffuse distal dx to be Rx medically EF normal   Jul 11 2018 d/c after fall with acetabular fracture More falls and admitted July 20 2018 intraventricular hemorrhage and subarachnoid with some hearing loss.   Imdur not helping him thinks it gives him chest pain. Seeing Dr Melvyn Novas  seems to have some restrictive lung disease and cannot move Incentive Spirometer very much   Still with general malaise and hurts all over More in stomach and radiates upwards Hurts to walk Seems to be taking nitro for non cardiac aching    Past Medical History:  Diagnosis Date  . CAD (coronary artery disease)    a. 04/2007 s/p CABG x 4  - LIMA->LAD, VG->OM1->Om2, VG->PDA;  b. 12/2008 & 06/2010 Caths - Native multivessel dzs with 4/4 patent grafts. c. cath 03/09/2015 patent grafts, unchanged from prior cath.  . Cancer [facility])    pancreatic  March 2011  . Carotid bruit    a. 07/2010 U/S- 0-39% bilat ICA stenosis  .  Chest pain    Noncardiac probably related to reflux  . DDD (degenerative disc disease)    several surgeries  . Fracture acetabulum-closed ([place]) 07/08/2018  . GERD (gastroesophageal reflux disease)   . HNP (herniated nucleus pulposus), lumbar    a. L2-3, s/p laminotomy, microdiskectomy 02/2009  . HOH (hard of hearing)    uses amplifiers   . Nephrolithiasis   . PAD (peripheral artery disease) ([place]) 04/2014   s/p L Common Iliac Stent  . Palpitations   . 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 Pneumonia    hx of  pna  . Pure hypercholesterolemia    statin intolerance  . Type II or unspecified type diabetes mellitus without mention of complication, not stated as uncontrolled    insulin dependent  . Unspecified essential hypertension     Past Surgical History:  Procedure Laterality Date  . ABDOMINAL AORTAGRAM N/A 05/13/2014   Procedure: ABDOMINAL [name];  Surgeon: Wellington Hampshire, MD;  Location: [facility] CATH LAB;  Service: Cardiovascular;  Laterality: N/A;  . ANTERIOR CERVICAL DECOMP/DISCECTOMY FUSION N/A 07/28/2014   Procedure: ANTERIOR CERVICAL DECOMPRESSION/DISCECTOMY FUSION CERVICAL FOUR-FIVE CERVICAL FIVE-SIX ;  Surgeon: Earnie Larsson, MD;  Location: [place] NEURO ORS;  Service: Neurosurgery;  Laterality: N/A;  . APPENDECTOMY    . arthroscopy right knee    . BACK SURGERY    .  CARDIAC CATHETERIZATION N/A 03/09/2015   Procedure: Left Heart Cath and Cors/Grafts Angiography;  Surgeon: [name] Sine, MD;  Location: [place] CV LAB;  Service: Cardiovascular;  Laterality: N/A;  . CHOLECYSTECTOMY    . CORONARY ARTERY BYPASS GRAFT     X 4 2009 (LIMA to LAD, SVG to first cicumflex marginal branch with sequential SVG to second cicumflex marginal branch  and saphenous vein to posterior descending coronary artery with endoscopic,vein harvest rt. lower exttremity by Dr.Owen on March 31,2009  . CYST REMOVED     FROM SPINE  . CYSTOURETHROSCOPY     right  retrograde pyelogram,manipulate stone in the renal pelvis, rt. double-j catheter.  [name] Kitchen EYE SURGERY    . KNEE SURGERY     rt knee  . LEFT HEART CATH AND CORS/GRAFTS ANGIOGRAPHY N/A 02/20/2017   Procedure: LEFT HEART CATH AND CORS/GRAFTS ANGIOGRAPHY;  Surgeon: Leonie Man, MD;  Location: [place] CV LAB;  Service: Cardiovascular;  Laterality: N/A;  . LEFT HEART CATH AND CORS/GRAFTS ANGIOGRAPHY N/A 05/28/2018   Procedure: LEFT HEART CATH AND CORS/GRAFTS ANGIOGRAPHY;  Surgeon: Burnell Blanks, MD;  Location: [place] CV LAB;  Service: Cardiovascular;  Laterality: N/A;  . NEPHROLITHOTOMY     PERCUTANEOUS  . PARS PLANA VITRECTOMY     lt. eye,retinal photocoagulation lt. eye, membrane peel lt. eye.  [name] Kitchen PERCUTANEOUS STENT INTERVENTION Left 05/13/2014   Procedure: PERCUTANEOUS STENT INTERVENTION;  Surgeon: Wellington Hampshire, MD;  Location: [place] CATH LAB;  Service: Cardiovascular;  Laterality: Left;  COMMON ILLIAC  . re-exploratin of laminectomy  05/2008   (RT L2-3) WITH REDO MICRODISKECTOMY     Current Outpatient Medications  Medication Sig Dispense Refill  . acetaminophen (TYLENOL) 325 MG tablet Take 650 mg by mouth every 6 (six) hours as needed (for pain).     [name] Kitchen amLODipine (NORVASC) 10 MG tablet Take 1 tablet (10 mg total) by mouth daily. 30 tablet 1  . apixaban (ELIQUIS) 5 MG TABS tablet Take 1 tablet (5 mg total) by mouth 2 (two) times daily. 60 tablet 0  . atorvastatin (LIPITOR) 20 MG tablet Take 1 tablet (20 mg total) by mouth at bedtime. 30 tablet 1  . bisacodyl (DULCOLAX) 5 MG EC tablet Take 5 mg by mouth every 3 (three) days.     [name] Kitchen docusate sodium (COLACE) 100 MG capsule Take 1 capsule (100 mg total) by mouth 2 (two) times daily. (Patient taking differently: Take 100 mg by mouth every 3 (three) days. ) 10 capsule 0  . esomeprazole (NEXIUM) 40 MG capsule Take 1 capsule (40 mg total) by mouth 2 (two) times daily before a meal. 60 capsule 0  . glipiZIDE (GLUCOTROL) 5 MG tablet Take  0.5 tablets (2.5 mg total) by mouth 2 (two) times daily before a meal. 60 tablet 0  . Insulin Glargine (LANTUS) 100 UNIT/ML Solostar Pen Inject 18 Units into the skin at bedtime. 15 mL 11  . isosorbide dinitrate (ISORDIL) 5 MG tablet Take 1 tablet (5 mg total) by mouth 2 (two) times daily. 60 tablet 0  . lisinopril (ZESTRIL) 20 MG tablet Take 20 mg by mouth daily.    . Melatonin 3 MG TABS Take 1 tablet (3 mg total) by mouth at bedtime. 30 tablet 0  . Multiple Vitamin (MULTIVITAMIN WITH MINERALS) TABS tablet Take 1 tablet by mouth daily.    . nitroGLYCERIN (NITROSTAT) 0.4 MG SL tablet PLACE ONE TABLET UNDER THE TONGUE EVERY 5 MINUTES AS NEEDED FOR CHEST PAIN 25 tablet  3  . senna (SENOKOT) 8.6 MG TABS tablet Take 1 tablet (8.6 mg total) by mouth at bedtime as needed for mild constipation. 120 each 0  . sucralfate (CARAFATE) 1 GM/10ML suspension Take 10 mLs (1 g total) by mouth 4 (four) times daily. 420 mL 0  . tamsulosin (FLOMAX) 0.4 MG CAPS capsule Take 1 capsule (0.4 mg total) by mouth daily after supper. 30 capsule 0  . telmisartan (MICARDIS) 80 MG tablet Take 1 tablet (80 mg total) by mouth daily. 30 tablet 11   No current facility-administered medications for this visit.     Allergies:   Oxycodone hcl, Ranexa [ranolazine], Rosuvastatin calcium, Rosuvastatin calcium, Tramadol, Iohexol, and Statins    Social History:  The patient  reports that he has never smoked. He has never used smokeless tobacco. He reports that he does not drink alcohol or use drugs.   Family History:  The patient's family history includes Coronary artery disease in his brother and sister; Diabetes in his brother; Heart attack in his father; Other in his mother and another family member.    ROS:  Please see the history of present illness.   Otherwise, review of systems are positive for none.   All other systems are reviewed and negative.    PHYSICAL EXAM: VS:  BP (!) 122/52   Pulse 64   Ht 5' 2" (1.575 m)   Wt 159  lb (72.1 kg)   SpO2 96%   BMI 29.08 kg/m  , BMI Body mass index is 29.08 kg/m. Frail elderly male   HEENT: normal  Neck: no JVD, or masses. Bilateral carotid bruit.  Cardiac: RRR; no  rubs, or gallops,no edema . 2/6 SEM at the base.  Respiratory:  clear to auscultation bilaterally, normal work of breathing GI: soft, nontender, nondistended, + BS MS: no deformity or atrophy  Skin: warm and dry, no rash Neuro:  Strength and sensation are intact Psych: euthymic mood, full affect Vascular: bilateral femoral bruits decreased peripheral pulses below knees   EKG:   SR old IMI LAE 07/22/18    Recent Labs: 07/08/2018: B Natriuretic Peptide 37.8 07/11/2018: Magnesium 2.0 10/16/2018: Pro B Natriuretic peptide (BNP) 47.0; TSH 1.69 10/18/2018: ALT 23 10/19/2018: BUN 13; Creatinine, Ser 0.76; Hemoglobin 12.6; Platelets 262; Potassium 3.2; Sodium 137    Lipid Panel    Component Value Date/Time   CHOL 148 10/19/2018 0227   TRIG 73 10/19/2018 0227   HDL 43 10/19/2018 0227   CHOLHDL 3.4 10/19/2018 0227   VLDL 15 10/19/2018 0227   LDLCALC 90 10/19/2018 0227      Wt Readings from Last 3 Encounters:  12/18/18 159 lb (72.1 kg)  10/18/18 150 lb 15.9 oz (68.5 kg)  10/16/18 151 lb (68.5 kg)         ASSESSMENT AND PLAN:  1.  Peripheral arterial disease: Status post left iliac artery stenting. He is known to have bilateral SFA occlusion. ABI [place] on left and 0.93 on right should not be causing all of his LE weakness or abdominal symptoms f/u with Dr [name] will check CPK/ESR  2. Coronary artery disease : patent grafts with diffuse distal disease 05/28/18 intolerant to ranexa and imdur stable by cath earlier this year   3. Hyperlipidemia: Continue treatment with atorvastatin with a target LDL of less than 70.  4. Essential hypertension: Well controlled.  Continue current medications and low sodium Dash type diet.    5. Bilateral carotid disease: Previous Doppler showed mild nonobstructive disease.  F/U  duplex October   6. FTT: down hill course earlier this year with multiple falls and SAH and long rehab course  Echo to make sure EF still normal    Disposition:   FU  Arida  This month me in 6 month Echo for dyspnea and weakness  CPK/EST, BMET CBC ordered   Signed, Jenkins Rouge, MD  12/18/2018 8:22 AM    [place]

## 2018-12-23 ENCOUNTER — Ambulatory Visit (HOSPITAL_COMMUNITY): Payer: PPO | Attending: Internal Medicine

## 2018-12-23 ENCOUNTER — Encounter (INDEPENDENT_AMBULATORY_CARE_PROVIDER_SITE_OTHER): Payer: Self-pay

## 2018-12-23 ENCOUNTER — Other Ambulatory Visit: Payer: Self-pay

## 2018-12-23 DIAGNOSIS — R0600 Dyspnea, unspecified: Secondary | ICD-10-CM | POA: Diagnosis not present

## 2018-12-23 DIAGNOSIS — R079 Chest pain, unspecified: Secondary | ICD-10-CM | POA: Insufficient documentation

## 2018-12-24 ENCOUNTER — Ambulatory Visit (INDEPENDENT_AMBULATORY_CARE_PROVIDER_SITE_OTHER): Payer: PPO | Admitting: Cardiovascular Disease

## 2018-12-24 VITALS — BP 144/60 | HR 67 | Ht 66.0 in | Wt 157.0 lb

## 2018-12-24 DIAGNOSIS — I10 Essential (primary) hypertension: Secondary | ICD-10-CM

## 2018-12-24 DIAGNOSIS — I25118 Atherosclerotic heart disease of native coronary artery with other forms of angina pectoris: Secondary | ICD-10-CM | POA: Diagnosis not present

## 2018-12-24 DIAGNOSIS — I739 Peripheral vascular disease, unspecified: Secondary | ICD-10-CM | POA: Diagnosis not present

## 2018-12-24 DIAGNOSIS — E782 Mixed hyperlipidemia: Secondary | ICD-10-CM

## 2018-12-24 NOTE — Patient Instructions (Signed)
Medication Instructions:  NO CHANGE *If you need a refill on your cardiac medications before your next appointment, please call your pharmacy*  Lab Work: If you have labs (blood work) drawn today and your tests are completely normal, you will receive your results only by: . MyChart Message (if you have MyChart) OR . A paper copy in the mail If you have any lab test that is abnormal or we need to change your treatment, we will call you to review the results.  Follow-Up: At [facility], you and your health needs are our priority.  As part of our continuing mission to provide you with exceptional heart care, we have created designated Provider Care Teams.  These Care Teams include your primary Cardiologist (physician) and Advanced Practice Providers (APPs -  Physician Assistants and Nurse Practitioners) who all work together to provide you with the care you need, when you need it.  Your next appointment:   6 months  The format for your next appointment:   In Person  Provider:   Muhammad Arida, MD   

## 2018-12-24 NOTE — Progress Notes (Signed)
Cardiology Office Note   Date:  12/24/2018   ID:  Ricardo Le, DOB [birth year], MRN [id number]  PCP:  Angelina Sheriff, MD  Cardiologist:  Dr. Johnsie Cancel  No chief complaint on file.     History of Present Illness: Ricardo Le is a 78 y.o. male who presents for a follow up visit regarding PAD. He has known history of coronary artery disease status post coronary artery bypass grafting in 2009 .  Cardiac cath in 2017, 2019 and 2020 showed patent grafts with diffuse distal disease.  Other past medical history include remote history of pancreatic cancer which was treated successfully. He is status post left common iliac artery stent placement in March 2016. He is known to have bilateral SFA occlusion with 1 vessel runoff below the knee bilaterally. He had previous neck surgery in 2016 with improvement in his neuropathic pain. He was hospitalized in June with small subarachnoid hemorrhage which did not require intervention.  He had multiple falls this year.  He was hospitalized in September with weakness and dyspnea and was found to have pulmonary embolism and light lower extremity DVT.  He was placed on anticoagulation after being cleared by neurosurgery. He had recent worsening of chest pain that did not improve with Ranexa or Imdur.  Most recent noninvasive vascular studies in October showed an ABI of 0.93 on the right and noncompressible vessel on the left.  Aortoiliac duplex was very limited but there was possible occlusion of the left common iliac artery stent. The patient complains of bilateral leg weakness but no significant discomfort in his legs.  No significant lower back pain.  He continues to complain of abdominal pain that radiates up to his chest especially when he is trying to sleep at night.  He denies heartburn.  Abdominal pain is not related to eating and there has been no history of weight loss.  Past Medical History:  Diagnosis Date  . CAD (coronary artery  disease)    a. 04/2007 s/p CABG x 4  - LIMA->LAD, VG->OM1->Om2, VG->PDA;  b. 12/2008 & 06/2010 Caths - Native multivessel dzs with 4/4 patent grafts. c. cath 03/09/2015 patent grafts, unchanged from prior cath.  . Cancer [facility])    pancreatic  March 2011  . Carotid bruit    a. 07/2010 U/S- 0-39% bilat ICA stenosis  . Chest pain    Noncardiac probably related to reflux  . DDD (degenerative disc disease)    several surgeries  . Fracture acetabulum-closed ([place]) 07/08/2018  . GERD (gastroesophageal reflux disease)   . HNP (herniated nucleus pulposus), lumbar    a. L2-3, s/p laminotomy, microdiskectomy 02/2009  . HOH (hard of hearing)    uses amplifiers   . Nephrolithiasis   . PAD (peripheral artery disease) ([place]) 04/2014   s/p L Common Iliac Stent  . Palpitations   . 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 Pneumonia    hx of  pna  . Pure hypercholesterolemia    statin intolerance  . Type II or unspecified type diabetes mellitus without mention of complication, not stated as uncontrolled    insulin dependent  . Unspecified essential hypertension     Past Surgical History:  Procedure Laterality Date  . ABDOMINAL AORTAGRAM N/A 05/13/2014   Procedure: ABDOMINAL [name];  Surgeon: Wellington Hampshire, MD;  Location: [facility] CATH LAB;  Service: Cardiovascular;  Laterality: N/A;  . ANTERIOR CERVICAL DECOMP/DISCECTOMY FUSION N/A 07/28/2014  Procedure: ANTERIOR CERVICAL DECOMPRESSION/DISCECTOMY FUSION CERVICAL FOUR-FIVE CERVICAL FIVE-SIX ;  Surgeon: Earnie Larsson, MD;  Location: [place] NEURO ORS;  Service: Neurosurgery;  Laterality: N/A;  . APPENDECTOMY    . arthroscopy right knee    . BACK SURGERY    . CARDIAC CATHETERIZATION N/A 03/09/2015   Procedure: Left Heart Cath and Cors/Grafts Angiography;  Surgeon: [name] Sine, MD;  Location: [facility] CV LAB;  Service: Cardiovascular;  Laterality: N/A;  . CHOLECYSTECTOMY    . CORONARY ARTERY BYPASS GRAFT     X 4  2009 (LIMA to LAD, SVG to first cicumflex marginal branch with sequential SVG to second cicumflex marginal branch  and saphenous vein to posterior descending coronary artery with endoscopic,vein harvest rt. lower exttremity by Dr.Owen on March 31,2009  . CYST REMOVED     FROM SPINE  . CYSTOURETHROSCOPY     right retrograde pyelogram,manipulate stone in the renal pelvis, rt. double-j catheter.  [name] Kitchen EYE SURGERY    . KNEE SURGERY     rt knee  . LEFT HEART CATH AND CORS/GRAFTS ANGIOGRAPHY N/A 02/20/2017   Procedure: LEFT HEART CATH AND CORS/GRAFTS ANGIOGRAPHY;  Surgeon: Leonie Man, MD;  Location: [place] CV LAB;  Service: Cardiovascular;  Laterality: N/A;  . LEFT HEART CATH AND CORS/GRAFTS ANGIOGRAPHY N/A 05/28/2018   Procedure: LEFT HEART CATH AND CORS/GRAFTS ANGIOGRAPHY;  Surgeon: Burnell Blanks, MD;  Location: [place] CV LAB;  Service: Cardiovascular;  Laterality: N/A;  . NEPHROLITHOTOMY     PERCUTANEOUS  . PARS PLANA VITRECTOMY     lt. eye,retinal photocoagulation lt. eye, membrane peel lt. eye.  [name] Kitchen PERCUTANEOUS STENT INTERVENTION Left 05/13/2014   Procedure: PERCUTANEOUS STENT INTERVENTION;  Surgeon: Wellington Hampshire, MD;  Location: [facility] CATH LAB;  Service: Cardiovascular;  Laterality: Left;  COMMON ILLIAC  . re-exploratin of laminectomy  05/2008   (RT L2-3) WITH REDO MICRODISKECTOMY     Current Outpatient Medications  Medication Sig Dispense Refill  . acetaminophen (TYLENOL) 325 MG tablet Take 650 mg by mouth every 6 (six) hours as needed (for pain).     [name] Kitchen amLODipine (NORVASC) 10 MG tablet Take 1 tablet (10 mg total) by mouth daily. 30 tablet 1  . apixaban (ELIQUIS) 5 MG TABS tablet Take 1 tablet (5 mg total) by mouth 2 (two) times daily. 60 tablet 0  . atorvastatin (LIPITOR) 20 MG tablet Take 1 tablet (20 mg total) by mouth at bedtime. 30 tablet 1  . bisacodyl (DULCOLAX) 5 MG EC tablet Take 5 mg by mouth every 3 (three) days.     [name] Kitchen docusate sodium (COLACE) 100 MG capsule  Take 1 capsule (100 mg total) by mouth 2 (two) times daily. (Patient taking differently: Take 100 mg by mouth every 3 (three) days. ) 10 capsule 0  . esomeprazole (NEXIUM) 40 MG capsule Take 1 capsule (40 mg total) by mouth 2 (two) times daily before a meal. 60 capsule 0  . glipiZIDE (GLUCOTROL) 5 MG tablet Take 0.5 tablets (2.5 mg total) by mouth 2 (two) times daily before a meal. 60 tablet 0  . Insulin Glargine (LANTUS) 100 UNIT/ML Solostar Pen Inject 18 Units into the skin at bedtime. 15 mL 11  . isosorbide dinitrate (ISORDIL) 5 MG tablet Take 1 tablet (5 mg total) by mouth 2 (two) times daily. 60 tablet 0  . lisinopril (ZESTRIL) 20 MG tablet Take 20 mg by mouth daily.    . Melatonin 3 MG TABS Take 1 tablet (3 mg total) by mouth  at bedtime. 30 tablet 0  . Multiple Vitamin (MULTIVITAMIN WITH MINERALS) TABS tablet Take 1 tablet by mouth daily.    . nitroGLYCERIN (NITROSTAT) 0.4 MG SL tablet PLACE ONE TABLET UNDER THE TONGUE EVERY 5 MINUTES AS NEEDED FOR CHEST PAIN 25 tablet 3  . senna (SENOKOT) 8.6 MG TABS tablet Take 1 tablet (8.6 mg total) by mouth at bedtime as needed for mild constipation. 120 each 0  . sucralfate (CARAFATE) 1 GM/10ML suspension Take 10 mLs (1 g total) by mouth 4 (four) times daily. 420 mL 0  . tamsulosin (FLOMAX) 0.4 MG CAPS capsule Take 1 capsule (0.4 mg total) by mouth daily after supper. 30 capsule 0  . telmisartan (MICARDIS) 80 MG tablet Take 1 tablet (80 mg total) by mouth daily. 30 tablet 11   No current facility-administered medications for this visit.     Allergies:   Oxycodone hcl, Ranexa [ranolazine], Rosuvastatin calcium, Rosuvastatin calcium, Tramadol, Iohexol, and Statins    Social History:  The patient  reports that he has never smoked. He has never used smokeless tobacco. He reports that he does not drink alcohol or use drugs.   Family History:  The patient's family history includes Coronary artery disease in his brother and sister; Diabetes in his brother;  Heart attack in his father; Other in his mother and another family member.    ROS:  Please see the history of present illness.   Otherwise, review of systems are positive for none.   All other systems are reviewed and negative.    PHYSICAL EXAM: VS:  BP (!) 144/60   Pulse 67   Ht 5\' 6"  (1.676 m)   Wt 157 lb (71.2 kg)   SpO2 95%   BMI 25.34 kg/m  , BMI Body mass index is 25.34 kg/m. GEN: Well nourished, well developed, in no acute distress  HEENT: normal  Neck: no JVD, or masses. Bilateral carotid bruit.  Cardiac: RRR; no  rubs, or gallops,no edema . 2/6 SEM at the base.  Respiratory:  clear to auscultation bilaterally, normal work of breathing GI: soft, nontender, nondistended, + BS MS: no deformity or atrophy  Skin: warm and dry, no rash Neuro:  Strength and sensation are intact Psych: euthymic mood, full affect Vascular: Femoral pulses: +2 on the right and +1 on the left.  EKG:  EKG is ordered today. EKG showed normal sinus rhythm with possible old anterior infarct.   Recent Labs: 07/08/2018: B Natriuretic Peptide 37.8 07/11/2018: Magnesium 2.0 10/16/2018: Pro B Natriuretic peptide (BNP) 47.0; TSH 1.69 10/18/2018: ALT 23 12/18/2018: BUN 20; Creatinine, Ser 0.92; Hemoglobin 13.4; Platelets 265; Potassium 4.1; Sodium 135    Lipid Panel    Component Value Date/Time   CHOL 148 10/19/2018 0227   TRIG 73 10/19/2018 0227   HDL 43 10/19/2018 0227   CHOLHDL 3.4 10/19/2018 0227   VLDL 15 10/19/2018 0227   LDLCALC 90 10/19/2018 0227      Wt Readings from Last 3 Encounters:  12/24/18 157 lb (71.2 kg)  12/18/18 159 lb (72.1 kg)  10/18/18 150 lb 15.9 oz (68.5 kg)         ASSESSMENT AND PLAN:  1.  Peripheral arterial disease: Status post left iliac artery stenting. He is known to have bilateral SFA occlusion.  Recent noninvasive vascular studies suggested possible left common iliac artery stent occlusion.  However, the patient has a palpable left femoral artery pulse which  makes it unlikely that the stent is occluded.  I suspect poor  visualization due to calcifications. Given his multiple medical issues going on right now and recent pulmonary embolism on anticoagulation, I am going to hold off on any invasive evaluation and reevaluate his symptoms in 6 months.  2. Coronary artery disease involving native coronary arteries with other forms of angina: It is not entirely clear why he is having that much chest discomfort.  Some of the symptoms are atypical and could be GI in nature.  I reviewed most recent cardiac catheterization in April which showed patent grafts.   Recent echocardiogram showed normal LV systolic function.  3. Hyperlipidemia: Continue treatment with atorvastatin with a target LDL of less than 70.  4. Essential hypertension: Blood pressure is reasonably controlled.  5. Bilateral carotid disease: Carotid Doppler in October showed mild nonobstructive disease.   Disposition:   FU with me in 6 months  Signed, Kathlyn Sacramento, MD  12/24/2018 9:09 AM    [place]

## 2018-12-30 ENCOUNTER — Telehealth: Payer: Self-pay | Admitting: Cardiovascular Disease

## 2018-12-30 DIAGNOSIS — I739 Peripheral vascular disease, unspecified: Secondary | ICD-10-CM

## 2018-12-30 NOTE — Telephone Encounter (Signed)
Returned the call to the patient's wife, per the dpr.   She stated that when the patient tries to ambulate or exert himself that he gets a pain from his lower breast bone area that radiates down to his hip region. Sometimes the pain will occur when he is sitting but mainly when he exerts himself.The wife stated that the pain is starting to get worse.  He has been trying to walk on the treadmill but has been unable to due to the pain.  He denies any leg pain or discomfort.

## 2018-12-30 NOTE — Telephone Encounter (Signed)
Wife of the patient called. The patient is still having pain in his chest all the way down to his abdominal area when he walks. She just wanted to ask some questions in regards to a recent scan he had.

## 2018-12-30 NOTE — Telephone Encounter (Signed)
Recommend CTA of the abdominal aorta with bifemoral run off to evaluate a vascular etiology of his pain.

## 2018-12-31 ENCOUNTER — Telehealth: Payer: Self-pay | Admitting: Nurse Practitioner

## 2018-12-31 DIAGNOSIS — R339 Retention of urine, unspecified: Secondary | ICD-10-CM | POA: Diagnosis not present

## 2018-12-31 DIAGNOSIS — N401 Enlarged prostate with lower urinary tract symptoms: Secondary | ICD-10-CM | POA: Diagnosis not present

## 2018-12-31 DIAGNOSIS — R972 Elevated prostate specific antigen [PSA]: Secondary | ICD-10-CM | POA: Diagnosis not present

## 2018-12-31 MED ORDER — PREDNISONE 50 MG PO TABS: ORAL_TABLET | ORAL | 0 refills | Status: DC

## 2018-12-31 MED ORDER — DIPHENHYDRAMINE HCL 50 MG PO TABS: ORAL_TABLET | ORAL | 0 refills | Status: DC

## 2018-12-31 NOTE — Telephone Encounter (Signed)
Phone call to patient to review instructions for 13 hr prep for CT w/ contrast on 11/25 at 1600. Prescription called into [facility] in [place]. Pt aware and verbalized understanding of instructions. Prescription: 0300- 50mg  Prednisone 0900- 50mg  Prednisone 1500- 50mg  Prednisone and 50mg  Benadryl

## 2018-12-31 NOTE — Telephone Encounter (Signed)
The patient's wife has been made aware of the  CTA of the abdominal aorta with bifemoral runoff.  The patient does have an contrast allergy therefore he will need to have the prophylaxis medication:  13 hours prior to your test time take one Prednisone 50 mg tablet, then 7 hours prior to your test time take one Prednisone 50 mg tablet, and then 1 hour prior to test time, take one Prednisone 50 mg tablet and one Benadryl 50 mg tablet.   An appointment has been made at [facility] on 01/08/2019 at 3:30, [address]. -No food 4 hours prior.

## 2019-01-07 ENCOUNTER — Ambulatory Visit: Payer: PPO | Admitting: Cardiovascular Disease

## 2019-01-08 ENCOUNTER — Ambulatory Visit
Admission: RE | Admit: 2019-01-08 | Discharge: 2019-01-08 | Disposition: A | Discharge status: Home / Self Care | Payer: PPO | Source: Ambulatory Visit | Attending: Cardiovascular Disease | Admitting: Cardiovascular Disease

## 2019-01-08 DIAGNOSIS — I745 Embolism and thrombosis of iliac artery: Secondary | ICD-10-CM | POA: Diagnosis not present

## 2019-01-08 DIAGNOSIS — I6621 Occlusion and stenosis of right posterior cerebral artery: Secondary | ICD-10-CM | POA: Diagnosis not present

## 2019-01-08 DIAGNOSIS — K8020 Calculus of gallbladder without cholecystitis without obstruction: Secondary | ICD-10-CM | POA: Diagnosis not present

## 2019-01-08 DIAGNOSIS — M898X8 Other specified disorders of bone, other site: Secondary | ICD-10-CM | POA: Diagnosis not present

## 2019-01-08 DIAGNOSIS — J986 Disorders of diaphragm: Secondary | ICD-10-CM | POA: Diagnosis not present

## 2019-01-08 DIAGNOSIS — I739 Peripheral vascular disease, unspecified: Secondary | ICD-10-CM | POA: Diagnosis not present

## 2019-01-08 DIAGNOSIS — I700 Atherosclerosis of aorta: Secondary | ICD-10-CM | POA: Diagnosis not present

## 2019-01-08 MED ORDER — IOPAMIDOL (ISOVUE-370) INJECTION 76%
100.0000 mL | Freq: Once | INTRAVENOUS | Status: AC | PRN
  Administered 2019-01-08: 100 mL via INTRAVENOUS

## 2019-01-15 DIAGNOSIS — Z23 Encounter for immunization: Secondary | ICD-10-CM | POA: Diagnosis not present

## 2019-01-15 DIAGNOSIS — D519 Vitamin B12 deficiency anemia, unspecified: Secondary | ICD-10-CM | POA: Diagnosis not present

## 2019-01-15 DIAGNOSIS — D510 Vitamin B12 deficiency anemia due to intrinsic factor deficiency: Secondary | ICD-10-CM | POA: Diagnosis not present

## 2019-01-17 ENCOUNTER — Telehealth: Payer: Self-pay | Admitting: Cardiovascular Disease

## 2019-01-17 NOTE — Telephone Encounter (Signed)
Follow Up:       Pt wants to know if any blood clots were found on jis test? If not, does he still need to keep taking his blood thinner?

## 2019-01-17 NOTE — Telephone Encounter (Signed)
Anticoagulation is usually recommended for at least 6 months after pulmonary embolism.  I agree that he  should discuss that with his primary care physician and pulmonologist.

## 2019-01-17 NOTE — Telephone Encounter (Signed)
Returned the call to the patient's wife. She was not calling for results of the CT as those were given to the patient on 01/14/2019.  She was calling to see if the patient could be taken off of the Eliquis. The patient's pulmonologist had sent the patient to the ED in September due to the results of a venous doppler. The patient was started on Eliquis during the hospital stay. The wife wanted to know if the clots (PE and DVTs) were gone so he could discontinue the Eliquis.  She stated that they were advised that the Eliquis would be taken short term. She has been advised that the patient should not stop the eliquis until advised by a provider. She stated that she will call the patient's PCP and Pulmonologist to see what their recommendations are for the Eliquis as well.  Message has been routed to Dr. [name] for his knowledge and any further recommendations.

## 2019-01-23 ENCOUNTER — Telehealth: Payer: Self-pay | Admitting: Cardiovascular Disease

## 2019-01-23 DIAGNOSIS — Z6824 Body mass index (BMI) 24.0-24.9, adult: Secondary | ICD-10-CM | POA: Diagnosis not present

## 2019-01-23 DIAGNOSIS — E1165 Type 2 diabetes mellitus with hyperglycemia: Secondary | ICD-10-CM | POA: Diagnosis not present

## 2019-01-23 DIAGNOSIS — I82409 Acute embolism and thrombosis of unspecified deep veins of unspecified lower extremity: Secondary | ICD-10-CM | POA: Diagnosis not present

## 2019-01-23 DIAGNOSIS — I10 Essential (primary) hypertension: Secondary | ICD-10-CM | POA: Diagnosis not present

## 2019-01-23 DIAGNOSIS — E7800 Pure hypercholesterolemia, unspecified: Secondary | ICD-10-CM | POA: Diagnosis not present

## 2019-01-23 DIAGNOSIS — I739 Peripheral vascular disease, unspecified: Secondary | ICD-10-CM | POA: Diagnosis not present

## 2019-01-23 NOTE — Telephone Encounter (Signed)
Ricardo Hough, LPN, this pt is requesting samples of Eliquis 5 mg tablets. The pt's medication has not been refilled since September. Could you please advise on this matter? I will be out of the office until Monday. Mindy will be in refills on Friday. Thanks

## 2019-01-23 NOTE — Telephone Encounter (Signed)
New Message ° ° °Patient calling the office for samples of medication: ° ° °1.  What medication and dosage are you requesting samples for?apixaban (ELIQUIS) 5 MG TABS tablet  ° °2.  Are you currently out of this medication? Yes  ° ° ° °

## 2019-01-24 NOTE — Telephone Encounter (Signed)
**Note De-Identified  Obfuscation** See phone note from 01/17/2019:  Ricardo Hampshire, MD to Ricardo Barker, RN     01/17/19 3:06 PM Note   Anticoagulation is usually recommended for at least 6 months after pulmonary embolism.  I agree that he  should discuss that with his primary care physician and pulmonologist.     I have called the pts wife and referred her to the Pts Pulmonologist, Dr Melvyn Novas.

## 2019-01-27 ENCOUNTER — Encounter: Payer: Self-pay | Admitting: Internal Medicine

## 2019-01-27 ENCOUNTER — Ambulatory Visit (INDEPENDENT_AMBULATORY_CARE_PROVIDER_SITE_OTHER): Payer: PPO | Admitting: Internal Medicine

## 2019-01-27 ENCOUNTER — Ambulatory Visit (INDEPENDENT_AMBULATORY_CARE_PROVIDER_SITE_OTHER): Payer: PPO

## 2019-01-27 ENCOUNTER — Other Ambulatory Visit: Payer: Self-pay

## 2019-01-27 DIAGNOSIS — R0600 Dyspnea, unspecified: Secondary | ICD-10-CM

## 2019-01-27 DIAGNOSIS — I82402 Acute embolism and thrombosis of unspecified deep veins of left lower extremity: Secondary | ICD-10-CM

## 2019-01-27 DIAGNOSIS — R0602 Shortness of breath: Secondary | ICD-10-CM | POA: Diagnosis not present

## 2019-01-27 DIAGNOSIS — R0609 Other forms of dyspnea: Secondary | ICD-10-CM

## 2019-01-27 LAB — SEDIMENTATION RATE: Sed Rate: 34 mm/hr — ABNORMAL HIGH (ref 0–20)

## 2019-01-27 LAB — CBC WITH DIFFERENTIAL/PLATELET
Basophils Absolute: 0.1 10*3/uL (ref 0.0–0.1)
Basophils Relative: 1.4 % (ref 0.0–3.0)
Eosinophils Absolute: 0.2 10*3/uL (ref 0.0–0.7)
Eosinophils Relative: 2.5 % (ref 0.0–5.0)
HCT: 41.8 % (ref 39.0–52.0)
Hemoglobin: 14.1 g/dL (ref 13.0–17.0)
Lymphocytes Relative: 33.9 % (ref 12.0–46.0)
Lymphs Abs: 2.8 10*3/uL (ref 0.7–4.0)
MCHC: 33.8 g/dL (ref 30.0–36.0)
MCV: 90.7 fl (ref 78.0–100.0)
Monocytes Absolute: 1.3 10*3/uL — ABNORMAL HIGH (ref 0.1–1.0)
Monocytes Relative: 15.4 % — ABNORMAL HIGH (ref 3.0–12.0)
Neutro Abs: 3.9 10*3/uL (ref 1.4–7.7)
Neutrophils Relative %: 46.8 % (ref 43.0–77.0)
Platelets: 230 10*3/uL (ref 150.0–400.0)
RBC: 4.61 Mil/uL (ref 4.22–5.81)
RDW: 13.7 % (ref 11.5–15.5)
WBC: 8.4 10*3/uL (ref 4.0–10.5)

## 2019-01-27 LAB — BASIC METABOLIC PANEL
BUN: 19 mg/dL (ref 6–23)
CO2: 29 mEq/L (ref 19–32)
Calcium: 9.7 mg/dL (ref 8.4–10.5)
Chloride: 100 mEq/L (ref 96–112)
Creatinine, Ser: 0.86 mg/dL (ref 0.40–1.50)
GFR: 86.16 mL/min (ref >60.00)
Glucose, Bld: 160 mg/dL — ABNORMAL HIGH (ref 70–99)
Potassium: 4.3 mEq/L (ref 3.5–5.1)
Sodium: 138 mEq/L (ref 135–145)

## 2019-01-27 LAB — BRAIN NATRIURETIC PEPTIDE: Pro B Natriuretic peptide (BNP): 61 pg/mL (ref 0.0–100.0)

## 2019-01-27 LAB — D-DIMER, QUANTITATIVE: D-Dimer, Quant: 0.23 mcg/mL FEU (ref <0.50)

## 2019-01-27 NOTE — Patient Instructions (Signed)
We will call to schedule you repeat lung scan and venous dopplers   Nexium 40 mg  Take 30- 60 min before your first and last meals of the day   GERD (REFLUX)  is an extremely common cause of respiratory symptoms just like yours , many times with no obvious heartburn at all.    It can be treated with medication, but also with lifestyle changes including elevation of the head of your bed (ideally with 6 -8inch blocks under the headboard of your bed),  Smoking cessation, avoidance of late meals, excessive alcohol, and avoid fatty foods, chocolate, peppermint, colas, red wine, and acidic juices such as orange juice.  NO MINT OR MENTHOL PRODUCTS SO NO COUGH DROPS  USE SUGARLESS CANDY INSTEAD (Jolley ranchers or Stover's or Life Savers) or even ice chips will also do - the key is to swallow to prevent all throat clearing. NO OIL BASED VITAMINS - use powdered substitutes.  Avoid fish oil when coughing.    Please remember to go to the lab and x-ray department   for your tests - we will call you with the results when they are available.     Please schedule a follow up office visit in  2 weeks, sooner if needed  with all medications /inhalers/ solutions in hand so we can verify exactly what you are taking. This includes all medications from all doctors and over the counters

## 2019-01-27 NOTE — Progress Notes (Signed)
Ricardo Le, male    DOB: November 08, 1940      MRN: [id number]   Brief patient profile:  89 yowm never smoker/ retired [employer]  s/p cabg in 2009 with no resp problems at all  until onset of doe x march 2020 and has developed a fixed pattern of doe x 50 ft fast or 100 ft slow  with neg cards w/u by Johnsie Cancel so referred to pulmonary clinic 10/16/2018 by Dr   Johnsie Cancel.   History of Present Illness  10/16/2018  Pulmonary/ 1st office eval/Dalin Le  Chief Complaint  Patient presents with  . Pulmonary Consult    Referred by Dr Johnsie Cancel "my heart is not getting enough oxygen". He c/o SOB if he walks fast for approx 100 ft.   Dyspnea:  Indolent onset of relatively  Fixed pattern doe x 6 m assoc with new gen cp/upper abd discomfort , no nausea /sweating and only time he ever has it is  with activity and also at hs if doesn't prop up  Cough: no Sleep: new problem has not been able to lie flat, smothering  Sensation, supposed to be on ppi bid but just taking nexium with bfast  SABA use: none  rec Stop lisinopril and replace with micardis 80 mg one daily  Take nexium 40 mg Take 30- 60 min before your first and last meals of the day  GERD diet  Please remember to go to the lab and x-ray department   for your tests - we will call you with the results when they are available  10/17/2018 v/q suspcious for PE  10/18/2018   Pos venous dopplers on L > admit  Date of Admission: 10/18/2018                        Date of Discharge: 10/19/2018 Admitting Physician: Zenia Resides, MD  Primary Care Provider: Angelina Sheriff, MD Consultants: None  Indication for Hospitalization: DVT with possible small PEs  Discharge Diagnoses/Problem List:  DVT with PE Subarachnoid hemorrhage 2/2 fall with head injury 07/20/2018 Acetabular fracture, closed Unstable angina CAD s/p CABG x4 PAD History of pancreatic cancer DDD GERD Hard of hearing HLD T2 DM HTN Constipation  Disposition: Home with home  health  Discharge Condition: Stable, improved  Discharge Exam:  General: Appears well, no acute distress. Age appropriate. Cardiac: RRR, normal heart sounds, no murmurs. Chest pain reproducible with grip strength test Respiratory: CTAB,mildly increaseeffort Abdomen: soft,mildlytender, nondistended Extremities: No edema or cyanosis. Skin: Warm and dry, no rashes noted Neuro: alert and oriented, no focal deficits. Direct and consensual light reflex present. PERRL. EOMI. Psych: normal affect  Brief Hospital Course:  [name] a 78 y.o.malewho presented with weakness and dyspnea, with work-up revealing a PE and LLE DVT.  He was placed on a heparin drip after neurosurgery was consulted and cleared him for anticoagulation due to his recent subarachnoid hemorrhage.  CT head showed resolving hemorrhage without any new bleeding.  Patient also complained of chest pain several times during his admission.  EKG showed no changes, and troponins were within normal limits and flat.  Due to his extensive cardiac history, cardiology was consulted.  They recommended stopping his Imdur and starting Isordil instead since Imdur has given him chest pain in the past.  Patient has had failure to thrive and frailty, which increases risk for falls and subsequent bleeding while on anticoagulation.  PT and OT evaluated patient, and home health was  ordered for the patient at PT's request.  Patient was given the treatment dose of Eliquis at 10 mg and discharged home on 9/5 with plans to continue Eliquis 10 mg twice daily for 7 days before transitioning to 5 mg twice daily.  Case management was consulted to ensure that patient would be able to afford this medication with plans for [facility] and to follow-up with patient after he received a coupon for 30 days of free Eliquis.  Patient was discharged on 9/5 after appropriate counseling and evaluation by cardiology.  Issues for Follow Up:  1. Ensure that patient has  discontinued his glipizide that was previously discontinued during hospitalization in 07/2018. 2. Ensure that patient continues to take Eliquis as prescribed 3. Reevaluate patient's fall risk and encourage continued PT for improvement of strength and balance  01/27/2019  f/u ov/Ricardo Le re: cp  Some better with ntg/ worse with walking x  March 2020  Chief Complaint  Patient presents with  . Follow-up    Pt states he has been having pain in his chest x2-3 months and he states when he walks more than about 73ft, the pain gets worse and also will become SOB.  Dyspnea: only sob when exerting but when gets sob always has cp with it Cough: none  Sleeping: in recliner x march 2020 pain comes back exact same place if supine  sev hours  SABA use: none 02: none   Cp is always anterior to L of midline, can radiate to R or to neck, can last up to sev hours p exertion rarely occurs sitting, recurs supine p sev hours    No obvious day to day or daytime variability or assoc excess/ purulent sputum or mucus plugs or hemoptysis or cp or chest tightness, subjective wheeze or overt sinus or hb symptoms.   Sleeping as above without nocturnal  or early am exacerbation  of respiratory  c/o's or need for noct saba. Also denies any obvious fluctuation of symptoms with weather or environmental changes or other aggravating or alleviating factors except as outlined above   No unusual exposure hx or h/o childhood pna/ asthma or knowledge of premature birth.  Current Allergies, Complete Past Medical History, Past Surgical History, Family History, and Social History were reviewed in [employer] record.  ROS  The following are not active complaints unless bolded Hoarseness, sore throat, dysphagia, dental problems, itching, sneezing,  nasal congestion or discharge of excess mucus or purulent secretions, ear ache,   fever, chills, sweats, unintended wt loss or wt gain, classically pleuritic cp,   "Orthopnea"(more cp than sob supine)  pnd or arm/hand swelling  or leg swelling, presyncope, palpitations, abdominal pain, anorexia, nausea, vomiting, diarrhea  or change in bowel habits or change in bladder habits, change in stools or change in urine, dysuria, hematuria,  rash, arthralgias, visual complaints, headache, numbness, weakness or ataxia or problems with walking or coordination,  change in mood or  memory.        Current Meds  Medication Sig  . acetaminophen (TYLENOL) 325 MG tablet Take 650 mg by mouth every 6 (six) hours as needed (for pain).   [name] Kitchen amLODipine (NORVASC) 10 MG tablet Take 1 tablet (10 mg total) by mouth daily.  [name] Kitchen apixaban (ELIQUIS) 5 MG TABS tablet Take 1 tablet (5 mg total) by mouth 2 (two) times daily.  [name] Kitchen atorvastatin (LIPITOR) 20 MG tablet Take 1 tablet (20 mg total) by mouth at bedtime.  . bisacodyl (DULCOLAX) 5 MG EC  tablet Take 5 mg by mouth every 3 (three) days.   . diphenhydrAMINE (BENADRYL) 50 MG tablet Take one 50 mg tablet one hour before the test with the last dose of the prednisone.  . docusate sodium (COLACE) 100 MG capsule Take 1 capsule (100 mg total) by mouth 2 (two) times daily. (Patient taking differently: Take 100 mg by mouth every 3 (three) days. )  . esomeprazole (NEXIUM) 40 MG capsule Take 1 capsule (40 mg total) by mouth 2 (two) times daily before a meal.  . glipiZIDE (GLUCOTROL) 5 MG tablet Take 0.5 tablets (2.5 mg total) by mouth 2 (two) times daily before a meal.  . Insulin Glargine (LANTUS) 100 UNIT/ML Solostar Pen Inject 18 Units into the skin at bedtime.  . isosorbide dinitrate (ISORDIL) 5 MG tablet Take 1 tablet (5 mg total) by mouth 2 (two) times daily.  [name] Kitchen lisinopril (ZESTRIL) 20 MG tablet Take 20 mg by mouth daily.  . Melatonin 3 MG TABS Take 1 tablet (3 mg total) by mouth at bedtime.  . Multiple Vitamin (MULTIVITAMIN WITH MINERALS) TABS tablet Take 1 tablet by mouth daily.  . nitroGLYCERIN (NITROSTAT) 0.4 MG SL tablet PLACE ONE TABLET  UNDER THE TONGUE EVERY 5 MINUTES AS NEEDED FOR CHEST PAIN  . senna (SENOKOT) 8.6 MG TABS tablet Take 1 tablet (8.6 mg total) by mouth at bedtime as needed for mild constipation.  . sucralfate (CARAFATE) 1 GM/10ML suspension Take 10 mLs (1 g total) by mouth 4 (four) times daily.  . tamsulosin (FLOMAX) 0.4 MG CAPS capsule Take 1 capsule (0.4 mg total) by mouth daily after supper.  . telmisartan (MICARDIS) 80 MG tablet Take 1 tablet (80 mg total) by mouth daily.                            Past Medical History:  Diagnosis Date  . CAD (coronary artery disease)    a. 04/2007 s/p CABG x 4  - LIMA->LAD, VG->OM1->Om2, VG->PDA;  b. 12/2008 & 06/2010 Caths - Native multivessel dzs with 4/4 patent grafts. c. cath 03/09/2015 patent grafts, unchanged from prior cath.  . Cancer [facility])    pancreatic  March 2011  . Carotid bruit    a. 07/2010 U/S- 0-39% bilat ICA stenosis  . Chest pain    Noncardiac probably related to reflux  . DDD (degenerative disc disease)    several surgeries  . Fracture acetabulum-closed ([place]) 07/08/2018  . GERD (gastroesophageal reflux disease)   . HNP (herniated nucleus pulposus), lumbar    a. L2-3, s/p laminotomy, microdiskectomy 02/2009  . HOH (hard of hearing)    uses amplifiers   . Nephrolithiasis   . PAD (peripheral artery disease) ([place]) 04/2014   s/p L Common Iliac Stent  . Palpitations   . 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 Pneumonia    hx of  pna  . Pure hypercholesterolemia    statin intolerance  . Type II or unspecified type diabetes mellitus without mention of complication, not stated as uncontrolled    insulin dependent  . Unspecified essential hypertension        Objective:     amb chronically ill appearing, very hard to communicate with due to hearing /masks  Wt Readings from Last 3 Encounters:  01/27/19 161 lb (73 kg)  12/24/18 157 lb (71.2 kg)  12/18/18 159 lb (72.1 kg)     Vital signs  reviewed - Note on arrival 02 sats  98% on RA    HEENT : pt wearing mask not removed for exam due to covid -19 concerns.    NECK :  without JVD/Nodes/TM/ nl carotid upstrokes bilaterally   LUNGS: no acc muscle use,  Nl contour chest which is clear to A and P bilaterally without cough on insp or exp maneuvers   CV:  RRR  no s3 or murmur or increase in P2, and no edema   ABD:  soft and nontender with nl inspiratory excursion in the supine position. No bruits or organomegaly appreciated, bowel sounds nl  MS:  Nl gait/ ext warm without deformities, calf tenderness, cyanosis or clubbing No obvious joint restrictions   SKIN: warm and dry without lesions    NEURO:  alert, approp, nl sensorium with  no motor or cerebellar deficits apparent.    CXR PA and Lateral:   01/27/2019 :    I personally reviewed images and agree with radiology impression as follows:    No acute process in the chest.   Labs ordered/ reviewed:      Chemistry      Component Value Date/Time   NA 138 01/27/2019 1129   NA 135 12/18/2018 0846   K 4.3 01/27/2019 1129   CL 100 01/27/2019 1129   CO2 29 01/27/2019 1129   BUN 19 01/27/2019 1129   BUN 20 12/18/2018 0846   CREATININE 0.86 01/27/2019 1129      Component Value Date/Time   CALCIUM 9.7 01/27/2019 1129   ALKPHOS 72 10/18/2018 1538   AST 19 10/18/2018 1538   ALT 23 10/18/2018 1538   BILITOT 0.7 10/18/2018 1538        Lab Results  Component Value Date   WBC 8.4 01/27/2019   HGB 14.1 01/27/2019   HCT 41.8 01/27/2019   MCV 90.7 01/27/2019   PLT 230.0 01/27/2019       EOS                                                              0.2                                     01/27/2019   Lab Results  Component Value Date   DDIMER 0.23 01/27/2019      Lab Results  Component Value Date   TSH 1.69 10/16/2018     Lab Results  Component Value Date   PROBNP 61.0 01/27/2019       Lab Results  Component Value Date   ESRSEDRATE 34 (H)  01/27/2019   ESRSEDRATE 18 12/18/2018                             Assessment

## 2019-01-29 ENCOUNTER — Encounter: Payer: Self-pay | Admitting: Internal Medicine

## 2019-01-29 ENCOUNTER — Telehealth: Payer: Self-pay | Admitting: Internal Medicine

## 2019-01-29 DIAGNOSIS — I82402 Acute embolism and thrombosis of unspecified deep veins of left lower extremity: Secondary | ICD-10-CM

## 2019-01-29 NOTE — Telephone Encounter (Signed)
He needs the venous dopplers and I ordered them but after review of the labs and records there is not need for the v/q esp since it's assoc with risk of  spreading covid and won't change my recs independent of the doppler which is why it's all I need for now to direct his rx

## 2019-01-29 NOTE — Assessment & Plan Note (Signed)
See venous dopplers 10/18/2018 pos Left  - repeat study 01/29/2019 >>>   Discussed in detail all the  indications, usual  risks and alternatives  relative to the benefits with patient who agrees to proceed with w/u as outlined.

## 2019-01-29 NOTE — Telephone Encounter (Signed)
Checked orders again as well with Magda Paganini and it does not look like an order was placed for the venous doppler. I have placed an order for this to be performed and put for it to be done as soon as possible. Nothing further needed.

## 2019-01-29 NOTE — Assessment & Plan Note (Signed)
Onset March 2020 assoc with ex cp and noct smothering better at 30 degrees  - 10/16/2018   Walked RA  2 laps @  approx 251ft each @ slow pace  stopped due to  End of study, c/o sob and cp p 100 ft but sats never lower than 98%   - V/Q 10/17/2018  Several small peripheral perfusion defects primary in the LEFT lower lobe suggest small distal pulmonary emboli.  - Venous dopplers 10/18/18 Pos L perineal DVT> directed to ER in setting of likely PE  - 01/27/2019   Walked RA x one lap =  approx 250 ft -@ slow pace stopped due to sob and cp at sats 97%   He has not been able to take DOAC consistently due to concerns with prior head bleed p fell but was seen by ns and ok'd for full rx  His outpt care is extremely challenging as he can't really hear well and not clear to me how he keeps up with his meds or exactly what he takes.  The good news is I doubt any of his cp or sob represents PE and the main challenge is monitoring the substrate for future pe ie the L DVT   Therefore rec Repeat the venous doppler but not the v/q (the peripheral lower lobe perfusion defects would not cause the syndrome he describes as it has been very stereotypical ex cp located ant rad to R of midline and never pleuritic, not change on eliquis, better with nitro)   F/u in 2 weeks with all meds in hand using a trust but verify approach to confirm accurate Medication  Reconciliation The principal here is that until we are certain that the  patients are doing what we've asked, it makes no sense to ask them to do more.    I had an extended discussion with the patient reviewing all relevant studies completed to date and  lasting 25 minutes of a 40  minute post hosp f/u office visit addressing severe non-specific but potentially very serious refractory respiratory symptoms of uncertain and potentially multiple  Etiologies.   I directly observed portions of ambulatory 02 saturation study which also added to face to face time with this visit.     Each maintenance medication was reviewed in detail including most importantly the difference between maintenance and prns and under what circumstances the prns are to be triggered using an action plan format that is not reflected in the computer generated alphabetically organized AVS.    Please see AVS for specific instructions unique to this office visit that I personally wrote and verbalized to the the pt in detail and then reviewed with pt  by my nurse highlighting any changes in therapy/plan of care  recommended at today's visit.

## 2019-01-29 NOTE — Telephone Encounter (Signed)
Instructions from [place] 12/14  We will call to schedule you repeat lung scan and venous dopplers   Nexium 40 mg  Take 30- 60 min before your first and last meals of the day   GERD (REFLUX)  is an extremely common cause of respiratory symptoms just like yours , many times with no obvious heartburn at all.    It can be treated with medication, but also with lifestyle changes including elevation of the head of your bed (ideally with 6 -8inch blocks under the headboard of your bed),  Smoking cessation, avoidance of late meals, excessive alcohol, and avoid fatty foods, chocolate, peppermint, colas, red wine, and acidic juices such as orange juice.  NO MINT OR MENTHOL PRODUCTS SO NO COUGH DROPS  USE SUGARLESS CANDY INSTEAD (Jolley ranchers or Stover's or Life Savers) or even ice chips will also do - the key is to swallow to prevent all throat clearing. NO OIL BASED VITAMINS - use powdered substitutes.  Avoid fish oil when coughing.    Please remember to go to the lab and x-ray department   for your tests - we will call you with the results when they are available.     Please schedule a follow up office visit in  2 weeks, sooner if needed  with all medications /inhalers/ solutions in hand so we can verify exactly what you are taking. This includes all medications from all doctors and over the counters     Looking back at orders from [facility] 12/14, seems like the only orders that were placed were for the labwork and xray. Dr. Melvyn Novas, please advise if you had meant to order CT and venous dopplers to be performed now on pt?

## 2019-01-31 ENCOUNTER — Ambulatory Visit (HOSPITAL_COMMUNITY)
Admission: RE | Admit: 2019-01-31 | Discharge: 2019-01-31 | Disposition: A | Discharge status: Home / Self Care | Payer: PPO | Source: Ambulatory Visit | Attending: Cardiovascular Disease | Admitting: Cardiovascular Disease

## 2019-01-31 ENCOUNTER — Other Ambulatory Visit: Payer: Self-pay

## 2019-01-31 ENCOUNTER — Telehealth: Payer: Self-pay | Admitting: Internal Medicine

## 2019-01-31 DIAGNOSIS — I82402 Acute embolism and thrombosis of unspecified deep veins of left lower extremity: Secondary | ICD-10-CM

## 2019-01-31 NOTE — Telephone Encounter (Signed)
Spoke with wife Inez Catalina who was concerned that pt had a leg scan (doppler) and not a chest scan (V/Q scan) today.  I advised that per 12/16 phone note it was explained to her that pt did not need the V/Q scan based on labs drawn on 12/14 office visit.  Relayed results of today's doppler as resulted by MW.  Wife expressed understanding.  Verified 12/28 appt date/time.  Nothing further needed at this time- will close encounter.

## 2019-02-03 ENCOUNTER — Telehealth: Payer: Self-pay | Admitting: Internal Medicine

## 2019-02-03 DIAGNOSIS — R0609 Other forms of dyspnea: Secondary | ICD-10-CM

## 2019-02-03 DIAGNOSIS — R0600 Dyspnea, unspecified: Secondary | ICD-10-CM

## 2019-02-03 NOTE — Telephone Encounter (Signed)
He is allergic to contrast so the only option is VQ which is fine with me but still may not be covered by insurance as treatment is the same for now with or without the scan

## 2019-02-03 NOTE — Telephone Encounter (Signed)
Called and spoke with patient's spouse. I advised her of the response received from Dr. Melvyn Novas.  She stated they received a letter in the mail today from their insurance company stating "test/procedure [phone] (MRI, PET, CT) was covered"  She was hoping he can have this done before year end since they have reached deductible for the year.  Patient wife stated the day that will not work will be 02/11/19 as she will be having hips replacement that day.  PCCs: based on the information she received, can the test Dr. Melvyn Novas mentioned be done?

## 2019-02-03 NOTE — Telephone Encounter (Signed)
pt had appt on 12/14 - scheduled again on 12/28 - doppler showwed no clots - did not have lung scan - Equilis making him have blurry vision - wants lung scan - does not understand why he can't have lung scan - feels that something else is going on - gets pain in chest when he exerts himself - CB# [phone]

## 2019-02-03 NOTE — Telephone Encounter (Signed)
Per12.14.2020 visit with Dr Melvyn Novas: DOE (dyspnea on exertion) - Tanda Rockers, MD at 01/29/2019  7:32 AM Status: Written  Related Problem: DOE (dyspnea on exertion)    Onset March 2020 assoc with ex cp and noct smothering better at 30 degrees  - 10/16/2018   Walked RA  2 laps @  approx 237f each @ slow pace  stopped due to  End of study, c/o sob and cp p 100 ft but sats never lower than 98%   - V/Q 10/17/2018  Several small peripheral perfusion defects primary in the LEFT lower lobe suggest small distal pulmonary emboli.  - Venous dopplers 10/18/18 Pos L perineal DVT> directed to ER in setting of likely PE  - 01/27/2019   Walked RA x one lap =  approx 250 ft -@ slow pace stopped due to sob and cp at sats 97%    He has not been able to take DOAC consistently due to concerns with prior head bleed p fell but was seen by ns and ok'd for full rx   His outpt care is extremely challenging as he can't really hear well and not clear to me how he keeps up with his meds or exactly what he takes.   The good news is I doubt any of his cp or sob represents PE and the main challenge is monitoring the substrate for future pe ie the L DVT    Therefore rec Repeat the venous doppler but not the v/q (the peripheral lower lobe perfusion defects would not cause the syndrome he describes as it has been very stereotypical ex cp located ant rad to R of midline and never pleuritic, not change on eliquis, better with nitro)    F/u in 2 weeks with all meds in hand using a trust but verify approach to confirm accurate Medication  Reconciliation The principal here is that until we are certain that the  patients are doing what we've asked, it makes no sense to ask them to do more.      I had an extended discussion with the patient reviewing all relevant studies completed to date and  lasting 25 minutes of a 40  minute post hosp f/u office visit addressing severe non-specific but potentially very serious refractory respiratory  symptoms of uncertain and potentially multiple  Etiologies.    I directly observed portions of ambulatory 02 saturation study which also added to face to face time with this visit.      Each maintenance medication was reviewed in detail including most importantly the difference between maintenance and prns and under what circumstances the prns are to be triggered using an action plan format that is not reflected in the computer generated alphabetically organized AVS.     Please see AVS for specific instructions unique to this office visit that I personally wrote and verbalized to the the pt in detail and then reviewed with pt  by my nurse highlighting any changes in therapy/plan of care  recommended at today's visit.          Called spoke with patient spouse BInez Catalinawho reported that patient's chest pain or dyspnea are unchanged since last visit with Dr. WMelvyn Novas  BInez Catalinastated that she/patient would like patient to have CT done to "make sure the clot in his lungs is gone" and "sometimes just knowing makes you feel better."  BInez Catalinastated that "they were going to do the lung scan but his labs were out of whack so they couldn't  do it."  Inez Catalina also mentioned blurred vision that they believe is from the Eliquis.  This was last rx'd by PCP and Inez Catalina was recommended to follow up with that office or cardiology about this possible side effect.  Patient does also have documented DM and Dr Zigmund Daniel Endocrinologist follows him for this.  If patient can have CT, Inez Catalina would like this done 1- prior to 12/29 as she is having hip surgery on this date or 2- prior to the end of the year as patient has met his deductible for 2020.  Dr. Melvyn Novas please advise, thank you.

## 2019-02-04 NOTE — Telephone Encounter (Signed)
V/Q and cxr ordered.

## 2019-02-04 NOTE — Telephone Encounter (Signed)
There are no test ordered

## 2019-02-04 NOTE — Telephone Encounter (Signed)
I scheduled VQ scan and gave appt info to pt's wife.  Nothing further needed.

## 2019-02-10 ENCOUNTER — Ambulatory Visit: Payer: PPO | Admitting: Internal Medicine

## 2019-02-12 ENCOUNTER — Other Ambulatory Visit: Payer: Self-pay

## 2019-02-12 ENCOUNTER — Encounter (HOSPITAL_COMMUNITY)
Admission: RE | Admit: 2019-02-12 | Discharge: 2019-02-12 | Disposition: A | Discharge status: Home / Self Care | Payer: PPO | Source: Ambulatory Visit | Attending: Internal Medicine | Admitting: Internal Medicine

## 2019-02-12 ENCOUNTER — Other Ambulatory Visit: Payer: Self-pay | Admitting: Internal Medicine

## 2019-02-12 ENCOUNTER — Ambulatory Visit (HOSPITAL_COMMUNITY)
Admission: RE | Admit: 2019-02-12 | Discharge: 2019-02-12 | Disposition: A | Discharge status: Home / Self Care | Payer: PPO | Source: Ambulatory Visit | Attending: Internal Medicine | Admitting: Internal Medicine

## 2019-02-12 DIAGNOSIS — R0600 Dyspnea, unspecified: Secondary | ICD-10-CM | POA: Insufficient documentation

## 2019-02-12 DIAGNOSIS — R0609 Other forms of dyspnea: Secondary | ICD-10-CM

## 2019-02-12 DIAGNOSIS — R0602 Shortness of breath: Secondary | ICD-10-CM | POA: Diagnosis not present

## 2019-02-12 MED ORDER — TECHNETIUM TO 99M ALBUMIN AGGREGATED
1.6000 | Freq: Once | INTRAVENOUS | Status: AC | PRN
  Administered 2019-02-12: 10:00:00 1.6 via INTRAVENOUS

## 2019-02-12 NOTE — Progress Notes (Signed)
Spoke with pt and notified of results per Dr. Wert. Pt verbalized understanding and denied any questions. 

## 2019-02-13 ENCOUNTER — Ambulatory Visit (INDEPENDENT_AMBULATORY_CARE_PROVIDER_SITE_OTHER): Payer: PPO | Admitting: Internal Medicine

## 2019-02-13 ENCOUNTER — Encounter: Payer: Self-pay | Admitting: Internal Medicine

## 2019-02-13 DIAGNOSIS — I82402 Acute embolism and thrombosis of unspecified deep veins of left lower extremity: Secondary | ICD-10-CM | POA: Diagnosis not present

## 2019-02-13 DIAGNOSIS — R0600 Dyspnea, unspecified: Secondary | ICD-10-CM

## 2019-02-13 DIAGNOSIS — R0609 Other forms of dyspnea: Secondary | ICD-10-CM

## 2019-02-13 MED ORDER — ESOMEPRAZOLE MAGNESIUM 40 MG PO CPDR: DELAYED_RELEASE_CAPSULE | ORAL | 2 refills | Status: DC

## 2019-02-13 NOTE — Patient Instructions (Signed)
Ideally you need to take Eliquis another 3 months but if your eyes are a problem discuss the risk with your eye doctor asap  Nexium 40 mg Take 30- 60 min before your first and last meals of the day   Please schedule a follow up office visit in 6 weeks, call sooner if needed with all medications /inhalers/ solutions in hand so we can verify exactly what you are taking. This includes all medications from all doctors and over the counters

## 2019-02-13 NOTE — Progress Notes (Signed)
Ricardo Le, male    DOB: 12/19/1940      MRN: [id number]   Brief patient profile:  76 yowm never smoker/ retired [employer]  s/p cabg in 2009 with no resp problems at all  until onset of doe x march 2020 when developed a fixed pattern of doe x 50 ft fast or 100 ft slow  with neg cards w/u by Johnsie Cancel so referred to pulmonary clinic 10/16/2018 by Dr   Johnsie Cancel.   History of Present Illness  10/16/2018  Pulmonary/ 1st office eval/Kasra Melvin  Chief Complaint  Patient presents with  . Pulmonary Consult    Referred by Dr Johnsie Cancel "my heart is not getting enough oxygen". He c/o SOB if he walks fast for approx 100 ft.   Dyspnea:  Indolent onset of relatively  Fixed pattern doe x 6 m assoc with new gen cp/upper abd discomfort , no nausea /sweating and only time he ever has it is  with activity and also at hs if doesn't prop up  Cough: no Sleep: new problem has not been able to lie flat, smothering  Sensation, supposed to be on ppi bid but just taking nexium with bfast  SABA use: none  rec Stop lisinopril and replace with micardis 80 mg one daily  Take nexium 40 mg Take 30- 60 min before your first and last meals of the day  GERD diet  Please remember to go to the lab and x-ray department   for your tests - we will call you with the results when they are available  10/17/2018 v/q suspcious for PE  10/18/2018   Pos venous dopplers on L > admit  Date of Admission: 10/18/2018                        Date of Discharge: 10/19/2018 Admitting Physician: Zenia Resides, MD   Indication for Hospitalization: DVT with possible small PEs  Discharge Diagnoses/Problem List:  DVT with PE Subarachnoid hemorrhage 2/2 fall with head injury 07/20/2018 Acetabular fracture, closed Unstable angina CAD s/p CABG x4 PAD History of pancreatic cancer DDD GERD Hard of hearing HLD T2 DM HTN Constipation  Disposition: Home with home health     Brief Hospital Course:  Ricardo L Scottonis a 78 y.o.malewho presented  with weakness and dyspnea, with work-up revealing a PE and LLE DVT.  He was placed on a heparin drip after neurosurgery was consulted and cleared him for anticoagulation due to his recent subarachnoid hemorrhage.  CT head showed resolving hemorrhage without any new bleeding.  Patient also complained of chest pain several times during his admission.  EKG showed no changes, and troponins were within normal limits and flat.  Due to his extensive cardiac history, cardiology was consulted.  They recommended stopping his Imdur and starting Isordil instead since Imdur has given him chest pain in the past.  Patient has had failure to thrive and frailty, which increases risk for falls and subsequent bleeding while on anticoagulation.  PT and OT evaluated patient, and home health was ordered for the patient at PT's request.  Patient was given the treatment dose of Eliquis at 10 mg and discharged home on 9/5 with plans to continue Eliquis 10 mg twice daily for 7 days before transitioning to 5 mg twice daily.  Case management was consulted to ensure that patient would be able to afford this medication with plans for [facility] and to follow-up with patient after he received a coupon for 30  days of free Eliquis.  Patient was discharged on 9/5 after appropriate counseling and evaluation by cardiology.      01/27/2019  f/u ov/Ricardo Le re: cp  Some better with ntg/ worse with walking x  March 2020  Chief Complaint  Patient presents with  . Follow-up    Pt states he has been having pain in his chest x2-3 months and he states when he walks more than about 76ft, the pain gets worse and also will become SOB.  Dyspnea: only sob when exerting but when gets sob always has cp with it Cough: none  Sleeping: in recliner x march 2020 pain comes back exact same place as his ex cp if  if supine x sev hours  rec We will call to schedule you repeat lung scan and venous dopplers > both neg Nexium 40 mg  Take 30- 60 min before your first and  last meals of the day  GERD deit  Please schedule a follow up office visit in  2 weeks, sooner if needed  with all medications /inhalers/ solutions in hand so we can verify exactly what you are taking. This includes all medications from all doctors and over the counters   02/13/2019  f/u ov/Ricardo Le re: f/u PE / atypical cp/ not on ppi (brought all meds) Chief Complaint  Patient presents with  . Follow-up  Dyspnea:  Better sob / still same cp with ex but less severe Cough: gone Sleeping: still x 30 degrees o/w has midline cp  SABA use: none 02: none    No obvious day to day or daytime variability or assoc excess/ purulent sputum or mucus plugs or hemoptysis or  chest tightness, subjective wheeze or overt sinus or hb symptoms.   Sleeping as above without nocturnal  or early am exacerbation  of respiratory  c/o's or need for noct saba. Also denies any obvious fluctuation of symptoms with weather or environmental changes or other aggravating or alleviating factors except as outlined above   No unusual exposure hx or h/o childhood pna/ asthma or knowledge of premature birth.  Current Allergies, Complete Past Medical History, Past Surgical History, Family History, and Social History were reviewed in [employer] record.  ROS  The following are not active complaints unless bolded Hoarseness, sore throat, dysphagia, dental problems, itching, sneezing,  nasal congestion or discharge of excess mucus or purulent secretions, ear ache,   fever, chills, sweats, unintended wt loss or wt gain, classically pleuritic   cp,  ?  orthopnea pnd or arm/hand swelling  or leg swelling, presyncope, palpitations, abdominal pain, anorexia, nausea, vomiting, diarrhea  or change in bowel habits or change in bladder habits, change in stools or change in urine, dysuria, hematuria,  rash, arthralgias, visual complaints since starting eliquis , headache, numbness, weakness or ataxia or problems with walking  or coordination,  change in mood or  memory.        Current Meds  Medication Sig  . acetaminophen (TYLENOL) 325 MG tablet Take 650 mg by mouth every 6 (six) hours as needed (for pain).   [name] Kitchen amLODipine (NORVASC) 10 MG tablet Take 1 tablet (10 mg total) by mouth daily.  [name] Kitchen apixaban (ELIQUIS) 5 MG TABS tablet Take 1 tablet (5 mg total) " Every day or two"  . atorvastatin (LIPITOR) 20 MG tablet Take 1 tablet (20 mg total) by mouth at bedtime.  . bisacodyl (DULCOLAX) 5 MG EC tablet Take 5 mg by mouth every 3 (three) days.   [name] Kitchen  diphenhydrAMINE (BENADRYL) 50 MG tablet Take one 50 mg tablet one hour before the test with the last dose of the prednisone.  . docusate sodium (COLACE) 100 MG capsule Take 1 capsule (100 mg total) by mouth 2 (two) times daily. (Patient taking differently: Take 100 mg by mouth every 3 (three) days. )  . esomeprazole (NEXIUM) 40 MG capsule Take 1 capsule (40 mg total) by mouth 2 (two) times daily before a meal.  . glipiZIDE (GLUCOTROL) 5 MG tablet Take 0.5 tablets (2.5 mg total) by mouth 2 (two) times daily before a meal.  . Insulin Glargine (LANTUS) 100 UNIT/ML Solostar Pen Inject 18 Units into the skin at bedtime.  . isosorbide dinitrate (ISORDIL) 5 MG tablet Take 1 tablet (5 mg total) by mouth 2 (two) times daily.  [name] Kitchen lisinopril (ZESTRIL) 20 MG tablet Take 20 mg by mouth daily.  . Melatonin 3 MG TABS Take 1 tablet (3 mg total) by mouth at bedtime.  . Multiple Vitamin (MULTIVITAMIN WITH MINERALS) TABS tablet Take 1 tablet by mouth daily.  . nitroGLYCERIN (NITROSTAT) 0.4 MG SL tablet PLACE ONE TABLET UNDER THE TONGUE EVERY 5 MINUTES AS NEEDED FOR CHEST PAIN  . predniSONE (DELTASONE) 50 MG tablet 13 hours prior to the test take one Prednisone tablet then 7 hours prior to the test take one tablet and then one hour prior to the test take the last 50 mg prednisone.  . senna (SENOKOT) 8.6 MG TABS tablet Take 1 tablet (8.6 mg total) by mouth at bedtime as needed for mild constipation.    . sucralfate (CARAFATE) 1 GM/10ML suspension Take 10 mLs (1 g total) by mouth 4 (four) times daily.  . tamsulosin (FLOMAX) 0.4 MG CAPS capsule Take 1 capsule (0.4 mg total) by mouth daily after supper.  . telmisartan (MICARDIS) 80 MG tablet Take 1 tablet (80 mg total) by mouth daily.                            Past Medical History:  Diagnosis Date  . CAD (coronary artery disease)    a. 04/2007 s/p CABG x 4  - LIMA->LAD, VG->OM1->Om2, VG->PDA;  b. 12/2008 & 06/2010 Caths - Native multivessel dzs with 4/4 patent grafts. c. cath 03/09/2015 patent grafts, unchanged from prior cath.  . Cancer [facility])    pancreatic  March 2011  . Carotid bruit    a. 07/2010 U/S- 0-39% bilat ICA stenosis  . Chest pain    Noncardiac probably related to reflux  . DDD (degenerative disc disease)    several surgeries  . Fracture acetabulum-closed ([place]) 07/08/2018  . GERD (gastroesophageal reflux disease)   . HNP (herniated nucleus pulposus), lumbar    a. L2-3, s/p laminotomy, microdiskectomy 02/2009  . HOH (hard of hearing)    uses amplifiers   . Nephrolithiasis   . PAD (peripheral artery disease) ([place]) 04/2014   s/p L Common Iliac Stent  . Palpitations   . 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 Pneumonia    hx of  pna  . Pure hypercholesterolemia    statin intolerance  . Type II or unspecified type diabetes mellitus without mention of complication, not stated as uncontrolled    insulin dependent  . Unspecified essential hypertension        Objective:     amb wm, a little brighter affect than previous    02/13/2019  162   01/27/19 161 lb (73 kg)  12/24/18 157 lb (71.2 kg)  12/18/18 159 lb (72.1 kg)    Vital signs reviewed - Note on arrival 02 sats  96% on RA      HEENT : pt wearing mask not removed for exam due to covid -19 concerns.    NECK :  without JVD/Nodes/TM/ nl carotid upstrokes bilaterally   LUNGS: no acc muscle use,  Nl  contour chest which is clear to A and P bilaterally without cough on insp or exp maneuvers   CV:  RRR  no s3 or murmur or increase in P2, and no edema   ABD:  soft and nontender with nl inspiratory excursion in the supine position. No bruits or organomegaly appreciated, bowel sounds nl  MS:  Nl gait/ ext warm without deformities, calf tenderness, cyanosis or clubbing No obvious joint restrictions   SKIN: warm and dry without lesions    NEURO:  alert, approp, nl sensorium with  no motor or cerebellar deficits apparent.        I personally reviewed images and agree with radiology impression as follows:  V/Q  02/12/2019 Radiotracer uptake is homogeneous and symmetric bilaterally. No perfusion defects are evident. My impression: no ventilation done but not needed to r/o PE given nl perfusion study                               Assessment

## 2019-02-15 ENCOUNTER — Encounter: Payer: Self-pay | Admitting: Internal Medicine

## 2019-02-15 NOTE — Assessment & Plan Note (Addendum)
See venous dopplers 10/18/2018 pos Left With min abn v/q > rx DOAC- - repeat venous dopplers  01/31/2019 >>> neg bilaterally   - VQ 02/12/2019  = Q only, nl perfusion   It is certainly possible that he had pe however his cp pattern and sob haven't really changed with rx with DOA and normalization of venous dopplers and perfusion scan so may be "true, true and unrelated findings"   Discussed in detail all the  indications, usual  risks and alternatives  relative to the benefits with patient who is concerned the [place] may be causing viz problems and wants to stop it.  I have strongly advised him to contact his opthamologist for his input on risk of full or prophylactic dosing with DOAC.  In meantime using eliquis 5 mg intermittently which I suppose is better than not using it at all as moving into prophylactic dosing if he gets approval by opth but advised to call right away if new L leg symptoms or change in breathing or a new pattern of CP (as PE very unlikely to go to same place twice).  >>> f/u 6 weeks with all meds in hand using a trust but verify approach to confirm accurate Medication  Reconciliation The principal here is that until we are certain that the  patients are doing what we've asked, it makes no sense to ask them to do more.    I had an extended discussion with the patient reviewing all relevant studies completed to date and  lasting 15 to 20 minutes of a 25 minute visit  which included directly observing ambulatory 02 saturation study documented in a/p section of  today's  office note.  Each maintenance medication was reviewed in detail including most importantly the difference between maintenance and prns and under what circumstances the prns are to be triggered using an action plan format that is not reflected in the computer generated alphabetically organized AVS.     Please see AVS for specific instructions unique to this visit that I personally wrote and verbalized to the the pt in  detail and then reviewed with pt  by my nurse highlighting any changes in therapy recommended at today's visit .

## 2019-02-15 NOTE — Assessment & Plan Note (Signed)
Onset March 2020 assoc with ex cp and noct smothering better at 30 degrees  - 10/16/2018   Walked RA  2 laps @  approx 222ft each @ slow pace  stopped due to  End of study, c/o sob and cp p 100 ft but sats never lower than 98%   - V/Q 10/17/2018  Several small peripheral perfusion defects primary in the LEFT lower lobe suggest small distal pulmonary emboli.  - Venous dopplers 10/18/18 Pos L perineal DVT> directed to ER in setting of likely PE - see PE - 01/27/2019   Walked RA x one lap =  approx 250 ft -@ slow pace stopped due to sob and cp at sats 97%   - 02/12/2019 > nl perfusion on repeat v/q - 02/13/2019   Walked RA x two laps =  approx 526ft @ slow pace - stopped due to end of study with cp p stopped  with sats of 95%  at the end of the study.  Clearly not limited by any pulmonary issue at this point but concerned about ongoing ex and supine cp's > add PPI q am ac and f/u cards as planned

## 2019-03-15 DIAGNOSIS — E7800 Pure hypercholesterolemia, unspecified: Secondary | ICD-10-CM | POA: Diagnosis not present

## 2019-03-15 DIAGNOSIS — E1165 Type 2 diabetes mellitus with hyperglycemia: Secondary | ICD-10-CM | POA: Diagnosis not present

## 2019-03-15 DIAGNOSIS — I10 Essential (primary) hypertension: Secondary | ICD-10-CM | POA: Diagnosis not present

## 2019-03-20 DIAGNOSIS — K219 Gastro-esophageal reflux disease without esophagitis: Secondary | ICD-10-CM | POA: Diagnosis not present

## 2019-03-20 DIAGNOSIS — Z6825 Body mass index (BMI) 25.0-25.9, adult: Secondary | ICD-10-CM | POA: Diagnosis not present

## 2019-03-20 DIAGNOSIS — R109 Unspecified abdominal pain: Secondary | ICD-10-CM | POA: Diagnosis not present

## 2019-03-20 DIAGNOSIS — E7800 Pure hypercholesterolemia, unspecified: Secondary | ICD-10-CM | POA: Diagnosis not present

## 2019-03-20 DIAGNOSIS — Z1331 Encounter for screening for depression: Secondary | ICD-10-CM | POA: Diagnosis not present

## 2019-03-26 DIAGNOSIS — K5900 Constipation, unspecified: Secondary | ICD-10-CM | POA: Diagnosis not present

## 2019-03-26 DIAGNOSIS — R1013 Epigastric pain: Secondary | ICD-10-CM | POA: Diagnosis not present

## 2019-03-26 DIAGNOSIS — K219 Gastro-esophageal reflux disease without esophagitis: Secondary | ICD-10-CM | POA: Diagnosis not present

## 2019-03-31 ENCOUNTER — Ambulatory Visit: Payer: PPO | Admitting: Internal Medicine

## 2019-04-07 ENCOUNTER — Other Ambulatory Visit: Payer: Self-pay | Admitting: Cardiovascular Disease

## 2019-04-17 DIAGNOSIS — D519 Vitamin B12 deficiency anemia, unspecified: Secondary | ICD-10-CM | POA: Diagnosis not present

## 2019-04-23 DIAGNOSIS — K219 Gastro-esophageal reflux disease without esophagitis: Secondary | ICD-10-CM | POA: Diagnosis not present

## 2019-04-23 DIAGNOSIS — R109 Unspecified abdominal pain: Secondary | ICD-10-CM | POA: Diagnosis not present

## 2019-04-23 DIAGNOSIS — K5904 Chronic idiopathic constipation: Secondary | ICD-10-CM | POA: Insufficient documentation

## 2019-04-23 DIAGNOSIS — K5900 Constipation, unspecified: Secondary | ICD-10-CM | POA: Diagnosis not present

## 2019-05-09 ENCOUNTER — Telehealth: Payer: Self-pay | Admitting: Cardiovascular Disease

## 2019-05-09 DIAGNOSIS — R1013 Epigastric pain: Secondary | ICD-10-CM | POA: Diagnosis not present

## 2019-05-09 DIAGNOSIS — G8929 Other chronic pain: Secondary | ICD-10-CM | POA: Diagnosis not present

## 2019-05-09 NOTE — Telephone Encounter (Signed)
It seems that he was seen by vascular surgery today at [facility] and they are looking into the same issue.  If he is getting an ultrasound there, there is no need to repeat here.  If he wants to have the test in our office, he should be scheduled for mesenteric(visceral) duplex.

## 2019-05-09 NOTE — Telephone Encounter (Signed)
Patient's wife states she is requesting to speak with Dr. Tyrell Antonio nurse in regards to rescheduling echocardiogram originally scheduled for 12/23/19. She states when the patient initially came in for the ultrasound, we had no success picking up an image. She would like a new order for an echocardiogram. Please assist.

## 2019-05-09 NOTE — Telephone Encounter (Signed)
Spoke with patient's spouse. Patient to have Mesenteric Duplex completed on Monday at [facility] to have ultrasound done today, wife states "they couldn't see the blood flow."  Patient will call to follow back up with Dr. [name] after results are in to see if he will resume care from [facility]. Will route to MD so they are aware.

## 2019-05-09 NOTE — Telephone Encounter (Signed)
Spoke with patient's spouse per DPR. Spouse reports patient's GI doctor believes there may be a blockage in the artery leading to patient's stomach. Patient advised to contact cardiology. Patient had a VAS US AORTA / IVC / ILIACS in October 2020.   It showed: No evidence of an abdominal aortic aneurysm was  visualized. Limited visualization of aorta due to overlying bowel gas.  Severe aortic wall plaque. There was overlying bowel and severe arterial plaque limiting  visualization of the vessels. There is a question of left CIA occlusion of  the stent with collateral flow in the distal CIA segment.   Spouse would like to know if study should be repeated or what the next steps are. Will route to Dr. [name] for review.

## 2019-05-12 DIAGNOSIS — K551 Chronic vascular disorders of intestine: Secondary | ICD-10-CM | POA: Diagnosis not present

## 2019-05-16 DIAGNOSIS — K551 Chronic vascular disorders of intestine: Secondary | ICD-10-CM | POA: Diagnosis not present

## 2019-05-16 DIAGNOSIS — Z20822 Contact with and (suspected) exposure to covid-19: Secondary | ICD-10-CM | POA: Diagnosis not present

## 2019-05-16 DIAGNOSIS — Z01812 Encounter for preprocedural laboratory examination: Secondary | ICD-10-CM | POA: Diagnosis not present

## 2019-05-20 DIAGNOSIS — Z01812 Encounter for preprocedural laboratory examination: Secondary | ICD-10-CM | POA: Diagnosis not present

## 2019-05-20 DIAGNOSIS — E119 Type 2 diabetes mellitus without complications: Secondary | ICD-10-CM | POA: Diagnosis not present

## 2019-05-20 DIAGNOSIS — K551 Chronic vascular disorders of intestine: Secondary | ICD-10-CM | POA: Diagnosis not present

## 2019-05-21 ENCOUNTER — Telehealth: Payer: Self-pay | Admitting: Cardiovascular Disease

## 2019-05-21 DIAGNOSIS — Z9081 Acquired absence of spleen: Secondary | ICD-10-CM | POA: Diagnosis not present

## 2019-05-21 DIAGNOSIS — K551 Chronic vascular disorders of intestine: Secondary | ICD-10-CM | POA: Diagnosis not present

## 2019-05-21 DIAGNOSIS — Z951 Presence of aortocoronary bypass graft: Secondary | ICD-10-CM | POA: Diagnosis not present

## 2019-05-21 DIAGNOSIS — E785 Hyperlipidemia, unspecified: Secondary | ICD-10-CM | POA: Diagnosis not present

## 2019-05-21 DIAGNOSIS — E1151 Type 2 diabetes mellitus with diabetic peripheral angiopathy without gangrene: Secondary | ICD-10-CM | POA: Diagnosis not present

## 2019-05-21 DIAGNOSIS — K5904 Chronic idiopathic constipation: Secondary | ICD-10-CM | POA: Diagnosis not present

## 2019-05-21 DIAGNOSIS — I10 Essential (primary) hypertension: Secondary | ICD-10-CM | POA: Diagnosis not present

## 2019-05-21 DIAGNOSIS — I081 Rheumatic disorders of both mitral and tricuspid valves: Secondary | ICD-10-CM | POA: Diagnosis not present

## 2019-05-21 DIAGNOSIS — Z90411 Acquired partial absence of pancreas: Secondary | ICD-10-CM | POA: Diagnosis not present

## 2019-05-21 DIAGNOSIS — Z86012 Personal history of benign carcinoid tumor: Secondary | ICD-10-CM | POA: Diagnosis not present

## 2019-05-21 DIAGNOSIS — K219 Gastro-esophageal reflux disease without esophagitis: Secondary | ICD-10-CM | POA: Diagnosis not present

## 2019-05-21 DIAGNOSIS — I2510 Atherosclerotic heart disease of native coronary artery without angina pectoris: Secondary | ICD-10-CM | POA: Diagnosis not present

## 2019-05-21 DIAGNOSIS — I700 Atherosclerosis of aorta: Secondary | ICD-10-CM | POA: Diagnosis not present

## 2019-05-21 DIAGNOSIS — Z79899 Other long term (current) drug therapy: Secondary | ICD-10-CM | POA: Diagnosis not present

## 2019-05-21 DIAGNOSIS — K5909 Other constipation: Secondary | ICD-10-CM | POA: Diagnosis not present

## 2019-05-21 DIAGNOSIS — Z794 Long term (current) use of insulin: Secondary | ICD-10-CM | POA: Diagnosis not present

## 2019-05-21 NOTE — Telephone Encounter (Signed)
Returned the call to the patient's wife, per the DPR. She stated that the patient has a mesenteric ultrasound completed at [facility] where possible blockages were found.   Today he underwent a Mesenteric Arteriogram where she stated that "no blockages were found".   She wanted to know if Dr. [name] had received the report and copy of the ultrasound as requested by her. She has been advised that nothing had been received as of yet.  Per Care Everywhere:  Assessment:   Postprandial and exercise induced abdominal pain. Multiple CT with contrast over past few years show patent celiac, SMA and IMA but with atherosclerosis. Do not see a critical stenosis despite good history for mesenteric angina. May be cardiac as exercise induced and relieved with NTG. So should have stress test given heart history. Also a contribution from severe constipation as relieved with BM.  Plan:   1.) Reschedule mesenteric Dopplers after NPO. 2.) If Duplex is ok, then will need caridac stress test as symptoms are also set off with ambulation, relieved with NTG. 3.) GI may have strategies to enhance bowel program as symptoms are much less of an issue after a BM, and he suffers chronic constipation that is laxative-dependent. 4.) Could have a component of celiac neuropathic pain given pain when he lays supine, better with sitting or with head elevated. If above issues addressed without improvement then could consider a diagnostic celiac nerve block.

## 2019-05-21 NOTE — Telephone Encounter (Signed)
The results are still not posted on care everywhere.

## 2019-05-21 NOTE — Telephone Encounter (Signed)
   Pt's wife calling, would like to speak with Dr. Tyrell Antonio nurse. She would like to know if Dr. [name] received records/results from [facility] from Pt's tests.  Please call

## 2019-05-23 DIAGNOSIS — R109 Unspecified abdominal pain: Secondary | ICD-10-CM | POA: Diagnosis not present

## 2019-05-23 DIAGNOSIS — K5909 Other constipation: Secondary | ICD-10-CM | POA: Diagnosis not present

## 2019-05-27 NOTE — Telephone Encounter (Signed)
The patient's wife has been made aware and will have them faxed.

## 2019-05-30 DIAGNOSIS — D510 Vitamin B12 deficiency anemia due to intrinsic factor deficiency: Secondary | ICD-10-CM | POA: Diagnosis not present

## 2019-05-30 DIAGNOSIS — E7800 Pure hypercholesterolemia, unspecified: Secondary | ICD-10-CM | POA: Diagnosis not present

## 2019-05-30 DIAGNOSIS — M1990 Unspecified osteoarthritis, unspecified site: Secondary | ICD-10-CM | POA: Diagnosis not present

## 2019-05-30 DIAGNOSIS — Z6825 Body mass index (BMI) 25.0-25.9, adult: Secondary | ICD-10-CM | POA: Diagnosis not present

## 2019-05-30 DIAGNOSIS — I10 Essential (primary) hypertension: Secondary | ICD-10-CM | POA: Diagnosis not present

## 2019-06-10 ENCOUNTER — Encounter: Payer: Self-pay | Admitting: Cardiovascular Disease

## 2019-06-10 ENCOUNTER — Other Ambulatory Visit: Payer: Self-pay

## 2019-06-10 ENCOUNTER — Ambulatory Visit: Payer: PPO | Admitting: Cardiovascular Disease

## 2019-06-10 VITALS — BP 152/64 | HR 67 | Ht 67.0 in | Wt 160.8 lb

## 2019-06-10 DIAGNOSIS — I779 Disorder of arteries and arterioles, unspecified: Secondary | ICD-10-CM | POA: Diagnosis not present

## 2019-06-10 DIAGNOSIS — I739 Peripheral vascular disease, unspecified: Secondary | ICD-10-CM | POA: Diagnosis not present

## 2019-06-10 DIAGNOSIS — I25118 Atherosclerotic heart disease of native coronary artery with other forms of angina pectoris: Secondary | ICD-10-CM

## 2019-06-10 DIAGNOSIS — I10 Essential (primary) hypertension: Secondary | ICD-10-CM

## 2019-06-10 DIAGNOSIS — E785 Hyperlipidemia, unspecified: Secondary | ICD-10-CM | POA: Diagnosis not present

## 2019-06-10 NOTE — Progress Notes (Signed)
Cardiology Office Note   Date:  06/10/2019   ID:  MOHNISH MCGLYNN, DOB 17-Feb-1940, MRN [id number]  PCP:  Angelina Sheriff, MD  Cardiologist:  Dr. Johnsie Cancel  No chief complaint on file.     History of Present Illness: Ricardo Le is a 79 y.o. male who presents for a follow up visit regarding PAD. He has known history of coronary artery disease status post coronary artery bypass grafting in 2009 .  Cardiac cath in 2017, 2019 and 2020 showed patent grafts with diffuse distal disease.  Other past medical history include remote history of pancreatic cancer which was treated successfully. He is status post left common iliac artery stent placement in March 2016. He is known to have bilateral SFA occlusion with 1 vessel runoff below the knee bilaterally. He had previous neck surgery in 2016 with improvement in his neuropathic pain. He was hospitalized in June, 2020 with small subarachnoid hemorrhage which did not require intervention.  He had multiple falls last year.  He was diagnosed with pulmonary embolism and right lower extremity DVT in September of last year and was placed on anticoagulation after being cleared by neurosurgery.  He took anticoagulation for few months and then it was discontinued.  Most recent noninvasive vascular studies in October showed an ABI of 0.93 on the right and noncompressible vessel on the left.  Aortoiliac duplex was very limited but there was possible occlusion of the left common iliac artery stent.  The patient's biggest issue recently has been abdominal pain of unclear etiology.  He was seen by vascular surgery at [facility] for possible chronic mesenteric ischemia.  His abdominal pain was occasionally postprandial but also worse with walking and partially relieved by nitroglycerin.  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  He did undergo mesenteric angiogram via a left brachial artery cutdown.  This  showed no significant stenosis affecting the celiac artery or superior mesenteric artery.  Past Medical History:  Diagnosis Date  . CAD (coronary artery disease)    a. 04/2007 s/p CABG x 4  - LIMA->LAD, VG->OM1->Om2, VG->PDA;  b. 12/2008 & 06/2010 Caths - Native multivessel dzs with 4/4 patent grafts. c. cath 03/09/2015 patent grafts, unchanged from prior cath.  . Cancer [facility])    pancreatic  March 2011  . Carotid bruit    a. 07/2010 U/S- 0-39% bilat ICA stenosis  . Chest pain    Noncardiac probably related to reflux  . DDD (degenerative disc disease)    several surgeries  . Fracture acetabulum-closed ([place]) 07/08/2018  . GERD (gastroesophageal reflux disease)   . HNP (herniated nucleus pulposus), lumbar    a. L2-3, s/p laminotomy, microdiskectomy 02/2009  . HOH (hard of hearing)    uses amplifiers   . Nephrolithiasis   . PAD (peripheral artery disease) ([place]) 04/2014   s/p L Common Iliac Stent  . Palpitations   . 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 Pneumonia    hx of  pna  . Pure hypercholesterolemia    statin intolerance  . Type II or unspecified type diabetes mellitus without mention of complication, not stated as uncontrolled    insulin dependent  . Unspecified essential hypertension     Past Surgical History:  Procedure Laterality Date  . ABDOMINAL AORTAGRAM N/A 05/13/2014   Procedure: ABDOMINAL [name];  Surgeon: Wellington Hampshire, MD;  Location: [place] CATH LAB;  Service: Cardiovascular;  Laterality: N/A;  . ANTERIOR CERVICAL DECOMP/DISCECTOMY FUSION N/A 07/28/2014   Procedure: ANTERIOR CERVICAL DECOMPRESSION/DISCECTOMY FUSION CERVICAL FOUR-FIVE CERVICAL FIVE-SIX ;  Surgeon: Earnie Larsson, MD;  Location: [place] NEURO ORS;  Service: Neurosurgery;  Laterality: N/A;  . APPENDECTOMY    . arthroscopy right knee    . BACK SURGERY    . CARDIAC CATHETERIZATION N/A 03/09/2015   Procedure: Left Heart Cath and Cors/Grafts Angiography;  Surgeon:  [name] Sine, MD;  Location: [place] CV LAB;  Service: Cardiovascular;  Laterality: N/A;  . CHOLECYSTECTOMY    . CORONARY ARTERY BYPASS GRAFT     X 4 2009 (LIMA to LAD, SVG to first cicumflex marginal branch with sequential SVG to second cicumflex marginal branch  and saphenous vein to posterior descending coronary artery with endoscopic,vein harvest rt. lower exttremity by Dr.Owen on March 31,2009  . CYST REMOVED     FROM SPINE  . CYSTOURETHROSCOPY     right retrograde pyelogram,manipulate stone in the renal pelvis, rt. double-j catheter.  [name] Kitchen EYE SURGERY    . KNEE SURGERY     rt knee  . LEFT HEART CATH AND CORS/GRAFTS ANGIOGRAPHY N/A 02/20/2017   Procedure: LEFT HEART CATH AND CORS/GRAFTS ANGIOGRAPHY;  Surgeon: Leonie Man, MD;  Location: [place] CV LAB;  Service: Cardiovascular;  Laterality: N/A;  . LEFT HEART CATH AND CORS/GRAFTS ANGIOGRAPHY N/A 05/28/2018   Procedure: LEFT HEART CATH AND CORS/GRAFTS ANGIOGRAPHY;  Surgeon: Burnell Blanks, MD;  Location: [place] CV LAB;  Service: Cardiovascular;  Laterality: N/A;  . NEPHROLITHOTOMY     PERCUTANEOUS  . PARS PLANA VITRECTOMY     lt. eye,retinal photocoagulation lt. eye, membrane peel lt. eye.  [name] Kitchen PERCUTANEOUS STENT INTERVENTION Left 05/13/2014   Procedure: PERCUTANEOUS STENT INTERVENTION;  Surgeon: Wellington Hampshire, MD;  Location: [place] CATH LAB;  Service: Cardiovascular;  Laterality: Left;  COMMON ILLIAC  . re-exploratin of laminectomy  05/2008   (RT L2-3) WITH REDO MICRODISKECTOMY     Current Outpatient Medications  Medication Sig Dispense Refill  . acetaminophen (TYLENOL) 325 MG tablet Take 650 mg by mouth every 6 (six) hours as needed (for pain).     [name] Kitchen amLODipine (NORVASC) 10 MG tablet Take 1 tablet (10 mg total) by mouth daily. 30 tablet 1  . aspirin EC 81 MG tablet Take 81 mg by mouth daily.    [name] Kitchen atorvastatin (LIPITOR) 20 MG tablet Take 1 tablet (20 mg total) by mouth at bedtime. 30 tablet 1  . bisacodyl  (DULCOLAX) 5 MG EC tablet Take 5 mg by mouth every 3 (three) days.     . diphenhydrAMINE (BENADRYL) 50 MG tablet Take one 50 mg tablet one hour before the test with the last dose of the prednisone. 1 tablet 0  . docusate sodium (COLACE) 100 MG capsule Take 1 capsule (100 mg total) by mouth 2 (two) times daily. (Patient taking differently: Take 100 mg by mouth every 3 (three) days. ) 10 capsule 0  . esomeprazole (NEXIUM) 40 MG capsule Take 30- 60 min before your first and last meals of the day 60 capsule 2  . glipiZIDE (GLUCOTROL) 5 MG tablet Take 0.5 tablets (2.5 mg total) by mouth 2 (two) times daily before a meal. 60 tablet 0  . Insulin Glargine (LANTUS) 100 UNIT/ML Solostar Pen Inject 18 Units into the skin at bedtime. 15 mL 11  . isosorbide dinitrate (ISORDIL) 5 MG tablet Take 1 tablet (5 mg total) by mouth 2 (two) times daily.  60 tablet 0  . lisinopril (ZESTRIL) 20 MG tablet Take 20 mg by mouth daily.    . Melatonin 3 MG TABS Take 1 tablet (3 mg total) by mouth at bedtime. 30 tablet 0  . Multiple Vitamin (MULTIVITAMIN WITH MINERALS) TABS tablet Take 1 tablet by mouth daily.    . nitroGLYCERIN (NITROSTAT) 0.4 MG SL tablet PLACE ONE TABLET UNDER THE TONGUE EVERY 5 MINUTES AS NEEDED FOR CHEST PAIN 25 tablet 3  . senna (SENOKOT) 8.6 MG TABS tablet Take 1 tablet (8.6 mg total) by mouth at bedtime as needed for mild constipation. 120 each 0  . tamsulosin (FLOMAX) 0.4 MG CAPS capsule Take 1 capsule (0.4 mg total) by mouth daily after supper. 30 capsule 0   No current facility-administered medications for this visit.    Allergies:   Oxycodone hcl, Ranexa [ranolazine], Rosuvastatin calcium, Rosuvastatin calcium, Tramadol, Iohexol, and Statins    Social History:  The patient  reports that he has never smoked. He has never used smokeless tobacco. He reports that he does not drink alcohol or use drugs.   Family History:  The patient's family history includes Coronary artery disease in his brother and  sister; Diabetes in his brother; Heart attack in his father; Other in his mother and another family member.    ROS:  Please see the history of present illness.   Otherwise, review of systems are positive for none.   All other systems are reviewed and negative.    PHYSICAL EXAM: VS:  BP (!) 152/64   Pulse 67   Ht 5\' 7"  (1.702 m)   Wt 160 lb 12.8 oz (72.9 kg)   SpO2 96%   BMI 25.18 kg/m  , BMI Body mass index is 25.18 kg/m. GEN: Well nourished, well developed, in no acute distress  HEENT: normal  Neck: no JVD, or masses. Bilateral carotid bruit.  Cardiac: RRR; no  rubs, or gallops,no edema . 2/6 SEM at the base.  Respiratory:  clear to auscultation bilaterally, normal work of breathing GI: soft, nontender, nondistended, + BS MS: no deformity or atrophy  Skin: warm and dry, no rash Neuro:  Strength and sensation are intact Psych: euthymic mood, full affect Vascular: Femoral pulses: +2 on the right and +1 on the left.  EKG:  EKG is not ordered today.   Recent Labs: 07/08/2018: B Natriuretic Peptide 37.8 07/11/2018: Magnesium 2.0 10/16/2018: TSH 1.69 10/18/2018: ALT 23 01/27/2019: BUN 19; Creatinine, Ser 0.86; Hemoglobin 14.1; Platelets 230.0; Potassium 4.3; Pro B Natriuretic peptide (BNP) 61.0; Sodium 138    Lipid Panel    Component Value Date/Time   CHOL 148 10/19/2018 0227   TRIG 73 10/19/2018 0227   HDL 43 10/19/2018 0227   CHOLHDL 3.4 10/19/2018 0227   VLDL 15 10/19/2018 0227   LDLCALC 90 10/19/2018 0227      Wt Readings from Last 3 Encounters:  06/10/19 160 lb 12.8 oz (72.9 kg)  02/13/19 162 lb 6.4 oz (73.7 kg)  01/27/19 161 lb (73 kg)         ASSESSMENT AND PLAN:  1.  Peripheral arterial disease: Status post left iliac artery stenting. He is known to have bilateral SFA occlusion.  Recent noninvasive vascular studies suggested possible left common iliac artery stent occlusion.  However, the patient has a palpable left femoral artery pulse which makes it  unlikely that the stent is occluded.  He reports no significant leg claudication at the present time.  2. Coronary artery disease involving native coronary  arteries with other forms of angina: Cardiac catheterization in April 2020 was reviewed again.  It showed severe native coronary artery disease with patent grafts.  However, he does have significant small vessel disease and not all branches get flow via the grafts.  It is possible that he has residual angina but did not respond to Ranexa before.  Continue current medications for now.  3. Hyperlipidemia: Continue treatment with atorvastatin with a target LDL of less than 70.  4. Essential hypertension: Blood pressure is reasonably controlled.  5. Bilateral carotid disease: Carotid Doppler in October showed mild nonobstructive disease.  6.  Abdominal pain: No evidence of chronic mesenteric ischemia by recent angiography.  I wonder if some of his symptoms might be anginal equivalent given the exertional component.   Disposition:   FU with me in 6 months  Signed, Kathlyn Sacramento, MD  06/10/2019 10:03 AM    [place]

## 2019-06-10 NOTE — Patient Instructions (Signed)

## 2019-06-16 ENCOUNTER — Encounter (INDEPENDENT_AMBULATORY_CARE_PROVIDER_SITE_OTHER): Payer: PPO | Admitting: Ophthalmology

## 2019-06-16 DIAGNOSIS — I10 Essential (primary) hypertension: Secondary | ICD-10-CM

## 2019-06-16 DIAGNOSIS — E113511 Type 2 diabetes mellitus with proliferative diabetic retinopathy with macular edema, right eye: Secondary | ICD-10-CM | POA: Diagnosis not present

## 2019-06-16 DIAGNOSIS — H35033 Hypertensive retinopathy, bilateral: Secondary | ICD-10-CM | POA: Diagnosis not present

## 2019-06-16 DIAGNOSIS — E11311 Type 2 diabetes mellitus with unspecified diabetic retinopathy with macular edema: Secondary | ICD-10-CM

## 2019-06-16 DIAGNOSIS — E113592 Type 2 diabetes mellitus with proliferative diabetic retinopathy without macular edema, left eye: Secondary | ICD-10-CM | POA: Diagnosis not present

## 2019-07-01 DIAGNOSIS — Z6825 Body mass index (BMI) 25.0-25.9, adult: Secondary | ICD-10-CM | POA: Diagnosis not present

## 2019-07-01 DIAGNOSIS — I259 Chronic ischemic heart disease, unspecified: Secondary | ICD-10-CM | POA: Diagnosis not present

## 2019-07-01 DIAGNOSIS — I10 Essential (primary) hypertension: Secondary | ICD-10-CM | POA: Diagnosis not present

## 2019-07-01 DIAGNOSIS — R0989 Other specified symptoms and signs involving the circulatory and respiratory systems: Secondary | ICD-10-CM | POA: Diagnosis not present

## 2019-07-01 DIAGNOSIS — E1165 Type 2 diabetes mellitus with hyperglycemia: Secondary | ICD-10-CM | POA: Diagnosis not present

## 2019-07-01 DIAGNOSIS — D510 Vitamin B12 deficiency anemia due to intrinsic factor deficiency: Secondary | ICD-10-CM | POA: Diagnosis not present

## 2019-07-01 DIAGNOSIS — E7800 Pure hypercholesterolemia, unspecified: Secondary | ICD-10-CM | POA: Diagnosis not present

## 2019-07-01 DIAGNOSIS — M1990 Unspecified osteoarthritis, unspecified site: Secondary | ICD-10-CM | POA: Diagnosis not present

## 2019-07-01 DIAGNOSIS — M8580 Other specified disorders of bone density and structure, unspecified site: Secondary | ICD-10-CM | POA: Diagnosis not present

## 2019-07-15 DIAGNOSIS — K5909 Other constipation: Secondary | ICD-10-CM | POA: Diagnosis not present

## 2019-07-15 DIAGNOSIS — R1013 Epigastric pain: Secondary | ICD-10-CM | POA: Diagnosis not present

## 2019-07-15 DIAGNOSIS — K219 Gastro-esophageal reflux disease without esophagitis: Secondary | ICD-10-CM | POA: Diagnosis not present

## 2019-07-21 DIAGNOSIS — D2239 Melanocytic nevi of other parts of face: Secondary | ICD-10-CM | POA: Diagnosis not present

## 2019-07-21 DIAGNOSIS — L570 Actinic keratosis: Secondary | ICD-10-CM | POA: Diagnosis not present

## 2019-07-21 DIAGNOSIS — D1801 Hemangioma of skin and subcutaneous tissue: Secondary | ICD-10-CM | POA: Diagnosis not present

## 2019-07-21 DIAGNOSIS — L578 Other skin changes due to chronic exposure to nonionizing radiation: Secondary | ICD-10-CM | POA: Diagnosis not present

## 2019-07-21 DIAGNOSIS — D225 Melanocytic nevi of trunk: Secondary | ICD-10-CM | POA: Diagnosis not present

## 2019-07-21 DIAGNOSIS — D485 Neoplasm of uncertain behavior of skin: Secondary | ICD-10-CM | POA: Diagnosis not present

## 2019-07-25 DIAGNOSIS — E538 Deficiency of other specified B group vitamins: Secondary | ICD-10-CM | POA: Diagnosis not present

## 2019-08-12 DIAGNOSIS — C4442 Squamous cell carcinoma of skin of scalp and neck: Secondary | ICD-10-CM | POA: Diagnosis not present

## 2019-08-27 ENCOUNTER — Telehealth: Payer: Self-pay | Admitting: Cardiovascular Disease

## 2019-08-27 NOTE — Telephone Encounter (Signed)
Left message for patient to call back  

## 2019-08-27 NOTE — Telephone Encounter (Signed)
Nish, I looked at the films. He has patent grafts with diffuse disease in the targets. If he is having accelerated angina, would not wait until September to see to decide on a plan. It may be best to have him seen by an APP or in your clinic and go ahead and set up a repeat cath. We would be looking at graft disease progression only as his natives are occluded. Gerald Stabs

## 2019-08-27 NOTE — Telephone Encounter (Signed)
His grafts were patent on last cath last year with diffuse disease distal to grafts and no target for PCI Can have him see Dr Julianne Handler who did his last cath to see if repeat cath needed or other thoughts He is welcome to get 2nd opinion at [facility]

## 2019-08-27 NOTE — Telephone Encounter (Signed)
Called patient's wife back with Dr. Kyla Balzarine recommendations. Patient is scheduled to see Dr. Angelena Form in September to discuss repeat heart cath.

## 2019-08-27 NOTE — Telephone Encounter (Signed)
I do not see Ranexa on the pt's med list. I will forward to primary card nurse for further follow up.

## 2019-08-27 NOTE — Telephone Encounter (Signed)
Pt c/o medication issue:  1. Name of Medication: Ranolazine- pt have stopped taking it  2. How are you currently taking this medication (dosage and times per day)?  1 tablet 2 times a day  3. Are you having a reaction (difficulty breathing--STAT)? No  4. What is your medication issue? felt weak  And no energy

## 2019-08-27 NOTE — Telephone Encounter (Signed)
Called patient's wife (DPR) back about her message. She is complaining about patient having to take nitro one to two times a day for chest pain when active. Patient has tried Imdur and Ranexa in the past and could not take due to side effects. Patient's wife was wondering if there was anything else that the patient could try. Also patient's wife was wondering if there is any advance procedure for patient to have done for his heart or see someone at [facility]. Patient's wife is just wondering if there is anything else that can be done. Will forward to Dr. Johnsie Cancel for further advisement.

## 2019-08-27 NOTE — Telephone Encounter (Signed)
Thanks Gerald Stabs- I looked at his films as well and unless the eccentric lesion in OM graft has progressed or new lesion agree not much to do Pam- can you get him seen by DOD or PA this week and set cath up - with CM if possible

## 2019-09-01 NOTE — Telephone Encounter (Signed)
Called patient's wife (DPR) and made patient an appointment for 09/08/19, the first available that works with patient's schedule. Informed her that Dr. Johnsie Cancel wanted patient to be seen to set up heart cath with Dr. Angelena Form sooner than later, patient's wife stated that next week would work better for them. Will forward to Truitt Merle NP who will see patient next Monday.

## 2019-09-02 NOTE — Progress Notes (Signed)
CARDIOLOGY OFFICE NOTE  Date:  09/08/2019    Ricardo Le Date of Birth: 05-Jun-1940 Medical Record #[id number]  PCP:  Angelina Sheriff, MD  Cardiologist:  Glo Herring   Chief Complaint  Patient presents with  . Chest Pain    Seen for Dr. Johnsie Cancel    History of Present Illness: Ricardo Le is a 79 y.o. male who presents today for a follow up visit. Seen for Dr. Johnsie Cancel - sees Dr. [name] for his PAD.   He has a known history of CAD with remote CABG x 4 in 2009, cath in 2017, 2019 and 2020 showing patent grafts with diffuse distal disease. Has not tolerated Ranexa - not really clear why - ?heart pounding. Other issues include pancreatic cancer that was treated successfully, PAD with prior left common iliac artery stenting in 2016, known bilateral SFA occlusion with 1 vessel funoff below the knees, prior neck surgery, small SAH following a fall in June 2020 that did not require intervention, prior PE with right leg DVT in September of 2020 - cleared by neurosurgery to be on anticoagulation for a few months.   Last seen by Dr. Johnsie Cancel in November of 2020. Has seen Dr. [name] in April. Noted more issues with abdominal pain and concern for mesenteric ischemia - and mesenteric angiogram was done via a left brachial artery cutdown.  This showed no significant stenosis affecting the celiac artery or superior mesenteric artery.  Phone call earlier this month - noted complaint of chest pain - Dr. Angelena Form has reviewed the films - there was discussion about possible repeat cath.   Comes in today. Here with his wife - she augments the history. Very hard for me to follow. Lots of somatic issues. Has thick saliva at times. Hard to "get it out at times". He is chronically fatigued - this does not seem unchanged/worse. He still has lots of stomach issues. Says GI will not do further testing because "its his heart".   He has had progressive chest pain for many months - using more NTG - but he  and his wife contradict this - he says he is not - she says he is. Says this is with and without exertion - will have both stomach and chest pain - somewhat similar to his prior chest pain syndome.  Having to sleep in the recliner - not really clear as to why - wakes up with abdominal pain and eats a cracker that helps. Eating a cracker with peanut butter will also help his chest pain at times - along with NTG.  He has apparently had GERD as well. He has not tolerated Ranexa in the past. It is unclear exactly why - maybe stomach upset - but he has this chronically. Most days using some NTG - she says he is using more - he says less. A bottle of NTG lasts about a month per his report.  There has been some variation in his heart rates - not really clear as to what - he notes that eating a cracker makes this go up as well.    Past Medical History:  Diagnosis Date  . CAD (coronary artery disease)    a. 04/2007 s/p CABG x 4  - LIMA->LAD, VG->OM1->Om2, VG->PDA;  b. 12/2008 & 06/2010 Caths - Native multivessel dzs with 4/4 patent grafts. c. cath 03/09/2015 patent grafts, unchanged from prior cath.  . Cancer [facility])    pancreatic  March 2011  .  Carotid bruit    a. 07/2010 U/S- 0-39% bilat ICA stenosis  . Chest pain    Noncardiac probably related to reflux  . DDD (degenerative disc disease)    several surgeries  . Fracture acetabulum-closed ([place]) 07/08/2018  . GERD (gastroesophageal reflux disease)   . HNP (herniated nucleus pulposus), lumbar    a. L2-3, s/p laminotomy, microdiskectomy 02/2009  . HOH (hard of hearing)    uses amplifiers   . Nephrolithiasis   . PAD (peripheral artery disease) ([place]) 04/2014   s/p L Common Iliac Stent  . Palpitations   . 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 Pneumonia    hx of  pna  . Pure hypercholesterolemia    statin intolerance  . Type II or unspecified type diabetes mellitus without mention of complication, not stated as  uncontrolled    insulin dependent  . Unspecified essential hypertension     Past Surgical History:  Procedure Laterality Date  . ABDOMINAL AORTAGRAM N/A 05/13/2014   Procedure: ABDOMINAL [name];  Surgeon: Wellington Hampshire, MD;  Location: [place] CATH LAB;  Service: Cardiovascular;  Laterality: N/A;  . ANTERIOR CERVICAL DECOMP/DISCECTOMY FUSION N/A 07/28/2014   Procedure: ANTERIOR CERVICAL DECOMPRESSION/DISCECTOMY FUSION CERVICAL FOUR-FIVE CERVICAL FIVE-SIX ;  Surgeon: Earnie Larsson, MD;  Location: [place] NEURO ORS;  Service: Neurosurgery;  Laterality: N/A;  . APPENDECTOMY    . arthroscopy right knee    . BACK SURGERY    . CARDIAC CATHETERIZATION N/A 03/09/2015   Procedure: Left Heart Cath and Cors/Grafts Angiography;  Surgeon: [name] Sine, MD;  Location: [place] CV LAB;  Service: Cardiovascular;  Laterality: N/A;  . CHOLECYSTECTOMY    . CORONARY ARTERY BYPASS GRAFT     X 4 2009 (LIMA to LAD, SVG to first cicumflex marginal branch with sequential SVG to second cicumflex marginal branch  and saphenous vein to posterior descending coronary artery with endoscopic,vein harvest rt. lower exttremity by Dr.Owen on March 31,2009  . CYST REMOVED     FROM SPINE  . CYSTOURETHROSCOPY     right retrograde pyelogram,manipulate stone in the renal pelvis, rt. double-j catheter.  [name] Kitchen EYE SURGERY    . KNEE SURGERY     rt knee  . LEFT HEART CATH AND CORS/GRAFTS ANGIOGRAPHY N/A 02/20/2017   Procedure: LEFT HEART CATH AND CORS/GRAFTS ANGIOGRAPHY;  Surgeon: Leonie Man, MD;  Location: [place] CV LAB;  Service: Cardiovascular;  Laterality: N/A;  . LEFT HEART CATH AND CORS/GRAFTS ANGIOGRAPHY N/A 05/28/2018   Procedure: LEFT HEART CATH AND CORS/GRAFTS ANGIOGRAPHY;  Surgeon: Burnell Blanks, MD;  Location: [place] CV LAB;  Service: Cardiovascular;  Laterality: N/A;  . NEPHROLITHOTOMY     PERCUTANEOUS  . PARS PLANA VITRECTOMY     lt. eye,retinal photocoagulation lt. eye, membrane peel lt. eye.  [name] Kitchen  PERCUTANEOUS STENT INTERVENTION Left 05/13/2014   Procedure: PERCUTANEOUS STENT INTERVENTION;  Surgeon: Wellington Hampshire, MD;  Location: [place] CATH LAB;  Service: Cardiovascular;  Laterality: Left;  COMMON ILLIAC  . re-exploratin of laminectomy  05/2008   (RT L2-3) WITH REDO MICRODISKECTOMY     Medications: Current Meds  Medication Sig  . acetaminophen (TYLENOL) 325 MG tablet Take 650 mg by mouth every 6 (six) hours as needed (for pain).   [name] Kitchen amLODipine (NORVASC) 10 MG tablet Take 1 tablet (10 mg total) by mouth daily.  [name] Kitchen aspirin EC 81 MG tablet Take 81 mg by mouth daily.  [name] Kitchen atorvastatin (LIPITOR)  20 MG tablet Take 1 tablet (20 mg total) by mouth at bedtime.  . BD PEN NEEDLE NANO 2ND GEN 32G X 4 MM MISC See admin instructions.  . bethanechol (URECHOLINE) 25 MG tablet Take 25 mg by mouth 2 (two) times daily.  . bisacodyl (DULCOLAX) 5 MG EC tablet Take 5 mg by mouth every 3 (three) days.   . diphenhydrAMINE (BENADRYL) 50 MG tablet Take one 50 mg tablet one hour before the test with the last dose of the prednisone.  . docusate sodium (COLACE) 100 MG capsule Take 1 capsule (100 mg total) by mouth 2 (two) times daily.  [name] Kitchen esomeprazole (NEXIUM) 40 MG capsule Take 30- 60 min before your first and last meals of the day  . gabapentin (NEURONTIN) 300 MG capsule Take 300 mg by mouth 3 (three) times daily.  [name] Kitchen glimepiride (AMARYL) 2 MG tablet Take 4 mg by mouth daily.  [name] Kitchen glipiZIDE (GLUCOTROL) 5 MG tablet Take 0.5 tablets (2.5 mg total) by mouth 2 (two) times daily before a meal.  . Insulin Glargine (LANTUS) 100 UNIT/ML Solostar Pen Inject 18 Units into the skin at bedtime.  [name] Kitchen lisinopril-hydrochlorothiazide (ZESTORETIC) 20-12.5 MG tablet Take 1 tablet by mouth every morning.  . Melatonin 3 MG TABS Take 1 tablet (3 mg total) by mouth at bedtime.  . Multiple Vitamin (MULTIVITAMIN WITH MINERALS) TABS tablet Take 1 tablet by mouth daily.  . nitroGLYCERIN (NITROSTAT) 0.4 MG SL tablet PLACE ONE TABLET UNDER THE  TONGUE EVERY 5 MINUTES AS NEEDED FOR CHEST PAIN  . ONETOUCH VERIO test strip 2 (two) times daily.  . pantoprazole (PROTONIX) 40 MG tablet Take 40 mg by mouth 2 (two) times daily.  [name] Kitchen senna (SENOKOT) 8.6 MG TABS tablet Take 1 tablet (8.6 mg total) by mouth at bedtime as needed for mild constipation.  . sucralfate (CARAFATE) 1 g tablet Take 1 g by mouth 2 (two) times daily.  . tamsulosin (FLOMAX) 0.4 MG CAPS capsule Take 1 capsule (0.4 mg total) by mouth daily after supper.     Allergies: Allergies  Allergen Reactions  . Oxycodone Hcl Nausea And Vomiting  . Ranexa [Ranolazine] Other (See Comments)    Caused weakness (possibly)  . Rosuvastatin Calcium Other (See Comments)    Muscle pain  . Rosuvastatin Calcium Other (See Comments)    Myalgias Muscle pain  . Tramadol Other (See Comments)    Makes patient "spaced out and dizzy"  . Iohexol Hives and Itching    Patient is ok with premedication (Benadryl 50 mg by mouth); Onset Date: 12/03/2007  Patient is ok with premedication (Benadryl 50 mg by mouth); Onset Date: 12/03/2007  . Statins Other (See Comments)    Muscle pain, but can tolerate Atorvastatin     Social History: The patient  reports that he has never smoked. He has never used smokeless tobacco. He reports that he does not drink alcohol and does not use drugs.   Family History: The patient's family history includes Coronary artery disease in his brother and sister; Diabetes in his brother; Heart attack in his father; Other in his mother and another family member.   Review of Systems: Please see the history of present illness.   All other systems are reviewed and negative.   Physical Exam: VS:  BP (!) 114/58   Pulse 67   Wt 155 lb (70.3 kg)   SpO2 97%   BMI 24.28 kg/m  .  BMI Body mass index is 24.28 kg/m.  Wt Readings from Last  3 Encounters:  09/08/19 155 lb (70.3 kg)  06/10/19 160 lb 12.8 oz (72.9 kg)  02/13/19 162 lb 6.4 oz (73.7 kg)    General: Alert and in  no acute distress.   Cardiac: Regular rate and rhythm. Soft outflow murmur. No edema.  Respiratory:  Lungs are clear to auscultation bilaterally with normal work of breathing.  GI: Soft and nontender.  MS: No deformity or atrophy. Gait and ROM intact. Using a cane.  Skin: Warm and dry. Color is normal.  Neuro:  Strength and sensation are intact and no gross focal deficits noted.  Psych: Alert, appropriate and with normal affect.   LABORATORY DATA:  EKG:  EKG is ordered today.  Personally reviewed by me. This demonstrates NSR with prior anterior MI - tracing looks unchanged.   Lab Results  Component Value Date   WBC 8.4 01/27/2019   HGB 14.1 01/27/2019   HCT 41.8 01/27/2019   PLT 230.0 01/27/2019   GLUCOSE 160 (H) 01/27/2019   CHOL 148 10/19/2018   TRIG 73 10/19/2018   HDL 43 10/19/2018   LDLCALC 90 10/19/2018   ALT 23 10/18/2018   AST 19 10/18/2018   NA 138 01/27/2019   K 4.3 01/27/2019   CL 100 01/27/2019   CREATININE 0.86 01/27/2019   BUN 19 01/27/2019   CO2 29 01/27/2019   TSH 1.69 10/16/2018   INR 1.0 10/18/2018   HGBA1C 7.9 (H) 07/21/2018     BNP (last 3 results) No results for input(s): BNP in the last 8760 hours.  ProBNP (last 3 results) Recent Labs    10/16/18 1039 01/27/19 1129  PROBNP 47.0 61.0     Other Studies Reviewed Today:  ECHO IMPRESSIONS 12/2018  1. Left ventricular ejection fraction, by visual estimation, is 60 to  65%. The left ventricle has normal function. There is mildly increased  left ventricular hypertrophy.  2. Left ventricular diastolic parameters are consistent with Grade I  diastolic dysfunction (impaired relaxation).  3. Global right ventricle has normal systolic function.The right  ventricular size is normal. No increase in right ventricular wall  thickness.  4. Left atrial size was normal.  5. Right atrial size was normal.  6. Mild mitral annular calcification.  7. The mitral valve is abnormal. Trace mitral  valve regurgitation.  8. The tricuspid valve is normal in structure. Tricuspid valve  regurgitation is trivial.  9. The aortic valve is tricuspid. Aortic valve regurgitation is not  visualized. Mild to moderate aortic valve sclerosis/calcification without  any evidence of aortic stenosis.  10. The pulmonic valve was normal in structure. Pulmonic valve  regurgitation is mild.  11. Normal pulmonary artery systolic pressure.     LEFT HEART CATH AND CORS/GRAFTS ANGIOGRAPHY 05/2018  Conclusion    Ost 1st Diag lesion is 70% stenosed.  Ost 2nd Diag to 2nd Diag lesion is 70% stenosed.  Ost LAD to Prox LAD lesion is 80% stenosed.  Prox LAD to Mid LAD lesion is 85% stenosed.  Prox LAD lesion is 100% stenosed.  Ost 3rd Mrg lesion is 100% stenosed.  Ost Cx to Dist Cx lesion is 80% stenosed.  Dist Cx lesion is 100% stenosed.  Prox RCA to Dist RCA lesion is 100% stenosed.  SVG and is large.  Seq SVG- OM3-OM4 and is large.  The graft exhibits no disease.  Prox Graft lesion before Ost 3rd Mrg is 25% stenosed.  The left ventricular systolic function is normal.  LV end diastolic pressure is normal.  The left  ventricular ejection fraction is 55-65% by visual estimate.  There is no mitral valve regurgitation.   1. Severe triple vessel CAD 2. Chronic occlusion proximal LAD. The mid and distal LAD fills from the patent LIMA graft to the mid LAD.  3. Chronic occlusion proximal Circumflex. The first and second obtuse marginal branches fill from the patent sequential vein graft 4. Chronic occlusion of the ostial RCA. The distal branches fill from the patent vein graft.  5. Normal LV systolic function.  6. The target vessels are diffusely diseased and unchanged grossly from last cath in 2019. No targets for PCI.   Recommendations: Continue medical management of CAD. Consider titration of anti-anginal therapy.      ASSESSMENT AND PLAN:  1. Chest pain - this is very unclear  - last cath from April of 2020 noted - his last cath done 05/28/18 patent SVG to RCA, patent SVG sequential OM's and patent LIMA to LAD with diffuse distal dx to be Rx medically EF normal - I reviewed this with Dr. Angelena Form today - his complaints are very hard to sort out - he gets relief with eating crackers/peanut butter and with NTG - he is willing to retry Imdur - he tolerates sl NTG very well. Further disposition to follow. May need 2nd opinion at other tertiary center. Probably needs to get back to GI as well. I think his options are going to be very limited going forward.   2. PAD - as described above.   3. HLD - on statin - would continue Lipitor.   4. HTN - BP is fine on current regimen.   5. GERD - on multiple agents - may need to get back to GI - may benefit from EGD.   6. Bilateral carotid disease: Carotid Doppler in October showed mild nonobstructive disease.   Current medicines are reviewed with the patient today.  The patient does not have concerns regarding medicines other than what has been noted above.  The following changes have been made:  See above.  Labs/ tests ordered today include:    Orders Placed This Encounter  Procedures  . Basic metabolic panel  . CBC  . Hepatic function panel  . Lipid panel  . EKG 12-Lead     Disposition:   FU with [place] in about 2 weeks.    Patient is agreeable to this plan and will call if any problems develop in the interim.   SignedTruitt Merle, NP  09/08/2019 2:53 PM  [place] [address] [place], [place]  [address] Phone: [phone] Fax: [phone]

## 2019-09-04 DIAGNOSIS — I10 Essential (primary) hypertension: Secondary | ICD-10-CM | POA: Diagnosis not present

## 2019-09-04 DIAGNOSIS — M545 Low back pain: Secondary | ICD-10-CM | POA: Diagnosis not present

## 2019-09-04 DIAGNOSIS — M47816 Spondylosis without myelopathy or radiculopathy, lumbar region: Secondary | ICD-10-CM | POA: Diagnosis not present

## 2019-09-04 DIAGNOSIS — D510 Vitamin B12 deficiency anemia due to intrinsic factor deficiency: Secondary | ICD-10-CM | POA: Diagnosis not present

## 2019-09-08 ENCOUNTER — Other Ambulatory Visit: Payer: Self-pay

## 2019-09-08 ENCOUNTER — Encounter: Payer: Self-pay | Admitting: Nurse Practitioner

## 2019-09-08 ENCOUNTER — Ambulatory Visit (INDEPENDENT_AMBULATORY_CARE_PROVIDER_SITE_OTHER): Payer: PPO | Admitting: Nurse Practitioner

## 2019-09-08 VITALS — BP 114/58 | HR 67 | Wt 155.0 lb

## 2019-09-08 DIAGNOSIS — I25118 Atherosclerotic heart disease of native coronary artery with other forms of angina pectoris: Secondary | ICD-10-CM

## 2019-09-08 DIAGNOSIS — I739 Peripheral vascular disease, unspecified: Secondary | ICD-10-CM

## 2019-09-08 DIAGNOSIS — E785 Hyperlipidemia, unspecified: Secondary | ICD-10-CM | POA: Diagnosis not present

## 2019-09-08 DIAGNOSIS — I10 Essential (primary) hypertension: Secondary | ICD-10-CM | POA: Diagnosis not present

## 2019-09-08 MED ORDER — ISOSORBIDE MONONITRATE ER 30 MG PO TB24
15.0000 mg | ORAL_TABLET | Freq: Every day | ORAL | 3 refills | Status: DC
Start: 2019-09-08 — End: 2020-11-11

## 2019-09-08 NOTE — Patient Instructions (Addendum)
  After Visit Summary:  We will be checking the following labs today - BMET, CBC, HPF and lipids   Medication Instructions:    Continue with your current medicines. BUT   We have agreed to retry Imdur - 30 mg - try just 1/2 a pill once a day - this is at your pharmacy.    If you need a refill on your cardiac medications before your next appointment, please call your pharmacy.     Testing/Procedures To Be Arranged:  N/A  Follow-Up:   See [place] back in about 2 weeks. If you fail to improve we will consider proceeding on with cardiac catheterization.     At [facility], you and your health needs are our priority.  As part of our continuing mission to provide you with exceptional heart care, we have created designated Provider Care Teams.  These Care Teams include your primary Cardiologist (physician) and Advanced Practice Providers (APPs -  Physician Assistants and Nurse Practitioners) who all work together to provide you with the care you need, when you need it.  Special Instructions:  . Stay safe, wash your hands for at least 20 seconds and wear a mask when needed.  . It was good to talk with you today.    Call the [place] office at [phone] if you have any questions, problems or concerns.

## 2019-09-09 ENCOUNTER — Telehealth: Payer: Self-pay

## 2019-09-09 LAB — LIPID PANEL
Chol/HDL Ratio: 4.4 ratio (ref 0.0–5.0)
Cholesterol, Total: 210 mg/dL — ABNORMAL HIGH (ref 100–199)
HDL: 48 mg/dL (ref >39)
LDL Chol Calc (NIH): 145 mg/dL — ABNORMAL HIGH (ref 0–99)
Triglycerides: 93 mg/dL (ref 0–149)
VLDL Cholesterol Cal: 17 mg/dL (ref 5–40)

## 2019-09-09 LAB — CBC
Hematocrit: 39.5 % (ref 37.5–51.0)
Hemoglobin: 13.3 g/dL (ref 13.0–17.7)
MCH: 30.4 pg (ref 26.6–33.0)
MCHC: 33.7 g/dL (ref 31.5–35.7)
MCV: 90 fL (ref 79–97)
Platelets: 250 10*3/uL (ref 150–450)
RBC: 4.37 x10E6/uL (ref 4.14–5.80)
RDW: 12.8 % (ref 11.6–15.4)
WBC: 9.9 10*3/uL (ref 3.4–10.8)

## 2019-09-09 LAB — BASIC METABOLIC PANEL
BUN/Creatinine Ratio: 24 (ref 10–24)
BUN: 21 mg/dL (ref 8–27)
CO2: 27 mmol/L (ref 20–29)
Calcium: 9.6 mg/dL (ref 8.6–10.2)
Chloride: 97 mmol/L (ref 96–106)
Creatinine, Ser: 0.87 mg/dL (ref 0.76–1.27)
GFR calc Af Amer: 96 mL/min/{1.73_m2} (ref >59)
GFR calc non Af Amer: 83 mL/min/{1.73_m2} (ref >59)
Glucose: 232 mg/dL — ABNORMAL HIGH (ref 65–99)
Potassium: 4.1 mmol/L (ref 3.5–5.2)
Sodium: 137 mmol/L (ref 134–144)

## 2019-09-09 LAB — HEPATIC FUNCTION PANEL
ALT: 26 IU/L (ref 0–44)
AST: 24 IU/L (ref 0–40)
Albumin: 4.5 g/dL (ref 3.7–4.7)
Alkaline Phosphatase: 73 IU/L (ref 48–121)
Bilirubin Total: 0.3 mg/dL (ref 0.0–1.2)
Bilirubin, Direct: 0.09 mg/dL (ref 0.00–0.40)
Total Protein: 7.6 g/dL (ref 6.0–8.5)

## 2019-09-09 MED ORDER — EZETIMIBE 10 MG PO TABS
10.0000 mg | ORAL_TABLET | Freq: Every day | ORAL | 3 refills | Status: DC
Start: 2019-09-09 — End: 2020-11-15

## 2019-09-09 NOTE — Telephone Encounter (Signed)
-----   Message from Burtis Junes, NP sent at 09/09/2019  7:31 AM EDT ----- Ok to report. Labs are stable but lipids not at goal - would add Zetia 10 mg a day - ok to wait and start this in about 2 weeks since we restarted Imdur (less confusion if does not tolerate) otherwise,  would continue on current regimen.

## 2019-09-09 NOTE — Telephone Encounter (Signed)
Called patient with lab results. Per Truitt Merle NP [place] to report. Labs are stable but lipids not at goal - would add Zetia 10 mg a day - ok to wait and start this in about 2 weeks since we restarted Imdur (less confusion if does not tolerate) otherwise,  would continue on current regimen. Patient verbalized understanding.

## 2019-09-10 DIAGNOSIS — M4000 Postural kyphosis, site unspecified: Secondary | ICD-10-CM | POA: Diagnosis not present

## 2019-09-10 DIAGNOSIS — M47816 Spondylosis without myelopathy or radiculopathy, lumbar region: Secondary | ICD-10-CM | POA: Diagnosis not present

## 2019-09-10 DIAGNOSIS — M545 Low back pain: Secondary | ICD-10-CM | POA: Diagnosis not present

## 2019-09-11 ENCOUNTER — Other Ambulatory Visit: Payer: Self-pay

## 2019-09-12 ENCOUNTER — Other Ambulatory Visit: Payer: Self-pay | Admitting: Cardiovascular Disease

## 2019-09-12 NOTE — Telephone Encounter (Signed)
 *  STAT* If patient is at the pharmacy, call can be transferred to refill team.   1. Which medications need to be refilled? (please list name of each medication and dose if known)   atorvastatin (LIPITOR) 20 MG tablet  2. Which pharmacy/location (including street and city if local pharmacy) is medication to be sent to?  [id number] - [place], [place] - [address] AT [place] [address]  3. Do they need a 30 day or 90 day supply? [place]

## 2019-09-17 DIAGNOSIS — M4000 Postural kyphosis, site unspecified: Secondary | ICD-10-CM | POA: Diagnosis not present

## 2019-09-29 ENCOUNTER — Telehealth: Payer: Self-pay | Admitting: Cardiovascular Disease

## 2019-09-29 NOTE — Telephone Encounter (Signed)
Patient's wife states the patient wakes up every night around 4:00 am. She states his HR will be low and that he hurts. She states she cannot explain exactly what hurts and would like to know if it could be vascular related and if he should see Dr. [name].

## 2019-09-29 NOTE — Telephone Encounter (Signed)
Pts wife called to report the pt had been waking up at night with a low heartrate and having abdominal pain that is keeping him up and he tells his wife that he feels he will not be here much longer due to the pain and not getting any relief.   They recently saw GI and they sent him to a "specialist" to be sure that he did not have a blockage and he had a procedure showing that he did not and they advised they did not need to see him anymore and to follow back up with cardiology.   They are asking to see Dr. [name] ASAP.[name] Kitchen they think he can help them determine if anything vascular is happening and causing him this discomfort.. they say the Imdur that Truitt Merle NP prescribed has been helping minimally. They are going to cancel the upcoming appt with Cecille Rubin 10/06/19 for now and see Dr. [name] 10/07/19 per their request.

## 2019-09-29 NOTE — Progress Notes (Deleted)
CARDIOLOGY OFFICE NOTE  Date:  09/29/2019    Ricardo Le Date of Birth: [birth year] Medical Record #[id number]  PCP:  Angelina Sheriff, MD  Cardiologist:  Servando Snare & ***    No chief complaint on file.   History of Present Illness: Ricardo Le is a 79 y.o. male who presents today for a ***    Comes in today. Here with   Past Medical History:  Diagnosis Date  . CAD (coronary artery disease)    a. 04/2007 s/p CABG x 4  - LIMA->LAD, VG->OM1->Om2, VG->PDA;  b. 12/2008 & 06/2010 Caths - Native multivessel dzs with 4/4 patent grafts. c. cath 03/09/2015 patent grafts, unchanged from prior cath.  . Cancer [facility])    pancreatic  March 2011  . Carotid bruit    a. 07/2010 U/S- 0-39% bilat ICA stenosis  . Chest pain    Noncardiac probably related to reflux  . DDD (degenerative disc disease)    several surgeries  . Fracture acetabulum-closed ([place]) 07/08/2018  . GERD (gastroesophageal reflux disease)   . HNP (herniated nucleus pulposus), lumbar    a. L2-3, s/p laminotomy, microdiskectomy 02/2009  . HOH (hard of hearing)    uses amplifiers   . Nephrolithiasis   . PAD (peripheral artery disease) ([place]) 04/2014   s/p L Common Iliac Stent  . Palpitations   . 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 Pneumonia    hx of  pna  . Pure hypercholesterolemia    statin intolerance  . Type II or unspecified type diabetes mellitus without mention of complication, not stated as uncontrolled    insulin dependent  . Unspecified essential hypertension     Past Surgical History:  Procedure Laterality Date  . ABDOMINAL AORTAGRAM N/A 05/13/2014   Procedure: ABDOMINAL [name];  Surgeon: Wellington Hampshire, MD;  Location: [place] CATH LAB;  Service: Cardiovascular;  Laterality: N/A;  . ANTERIOR CERVICAL DECOMP/DISCECTOMY FUSION N/A 07/28/2014   Procedure: ANTERIOR CERVICAL DECOMPRESSION/DISCECTOMY FUSION CERVICAL FOUR-FIVE CERVICAL FIVE-SIX ;  Surgeon:  Earnie Larsson, MD;  Location: [place] NEURO ORS;  Service: Neurosurgery;  Laterality: N/A;  . APPENDECTOMY    . arthroscopy right knee    . BACK SURGERY    . CARDIAC CATHETERIZATION N/A 03/09/2015   Procedure: Left Heart Cath and Cors/Grafts Angiography;  Surgeon: [name] Sine, MD;  Location: [place] CV LAB;  Service: Cardiovascular;  Laterality: N/A;  . CHOLECYSTECTOMY    . CORONARY ARTERY BYPASS GRAFT     X 4 2009 (LIMA to LAD, SVG to first cicumflex marginal branch with sequential SVG to second cicumflex marginal branch  and saphenous vein to posterior descending coronary artery with endoscopic,vein harvest rt. lower exttremity by Dr.Owen on March 31,2009  . CYST REMOVED     FROM SPINE  . CYSTOURETHROSCOPY     right retrograde pyelogram,manipulate stone in the renal pelvis, rt. double-j catheter.  [name] Kitchen EYE SURGERY    . KNEE SURGERY     rt knee  . LEFT HEART CATH AND CORS/GRAFTS ANGIOGRAPHY N/A 02/20/2017   Procedure: LEFT HEART CATH AND CORS/GRAFTS ANGIOGRAPHY;  Surgeon: Leonie Man, MD;  Location: [place] CV LAB;  Service: Cardiovascular;  Laterality: N/A;  . LEFT HEART CATH AND CORS/GRAFTS ANGIOGRAPHY N/A 05/28/2018   Procedure: LEFT HEART CATH AND CORS/GRAFTS ANGIOGRAPHY;  Surgeon: Burnell Blanks, MD;  Location: [place] CV LAB;  Service: Cardiovascular;  Laterality: N/A;  .  NEPHROLITHOTOMY     PERCUTANEOUS  . PARS PLANA VITRECTOMY     lt. eye,retinal photocoagulation lt. eye, membrane peel lt. eye.  [name] Kitchen PERCUTANEOUS STENT INTERVENTION Left 05/13/2014   Procedure: PERCUTANEOUS STENT INTERVENTION;  Surgeon: Wellington Hampshire, MD;  Location: [place] CATH LAB;  Service: Cardiovascular;  Laterality: Left;  COMMON ILLIAC  . re-exploratin of laminectomy  05/2008   (RT L2-3) WITH REDO MICRODISKECTOMY     Medications: No outpatient medications have been marked as taking for the 10/06/19 encounter (Appointment) with Burtis Junes, NP.     Allergies: Allergies  Allergen  Reactions  . Oxycodone Hcl Nausea And Vomiting  . Ranexa [Ranolazine] Other (See Comments)    Caused weakness (possibly)  . Rosuvastatin Calcium Other (See Comments)    Muscle pain  . Rosuvastatin Calcium Other (See Comments)    Myalgias Muscle pain  . Tramadol Other (See Comments)    Makes patient "spaced out and dizzy"  . Iohexol Hives and Itching    Patient is ok with premedication (Benadryl 50 mg by mouth); Onset Date: 12/03/2007  Patient is ok with premedication (Benadryl 50 mg by mouth); Onset Date: 12/03/2007  . Statins Other (See Comments)    Muscle pain, but can tolerate Atorvastatin     Social History: The patient  reports that he has never smoked. He has never used smokeless tobacco. He reports that he does not drink alcohol and does not use drugs.   Family History: The patient's ***family history includes Coronary artery disease in his brother and sister; Diabetes in his brother; Heart attack in his father; Other in his mother and another family member.   Review of Systems: Please see the history of present illness.   All other systems are reviewed and negative.   Physical Exam: VS:  There were no vitals taken for this visit. [name] Kitchen  BMI There is no height or weight on file to calculate BMI.  Wt Readings from Last 3 Encounters:  09/08/19 155 lb (70.3 kg)  06/10/19 160 lb 12.8 oz (72.9 kg)  02/13/19 162 lb 6.4 oz (73.7 kg)    General: Pleasant. Well developed, well nourished and in no acute distress.   HEENT: Normal.  Neck: Supple, no JVD, carotid bruits, or masses noted.  Cardiac: ***Regular rate and rhythm. No murmurs, rubs, or gallops. No edema.  Respiratory:  Lungs are clear to auscultation bilaterally with normal work of breathing.  GI: Soft and nontender.  MS: No deformity or atrophy. Gait and ROM intact.  Skin: Warm and dry. Color is normal.  Neuro:  Strength and sensation are intact and no gross focal deficits noted.  Psych: Alert, appropriate and with  normal affect.   LABORATORY DATA:  EKG:  EKG {ACTION; IS/IS FOY:[id number]} ordered today.  Personally reviewed by me. This demonstrates ***.  Lab Results  Component Value Date   WBC 9.9 09/08/2019   HGB 13.3 09/08/2019   HCT 39.5 09/08/2019   PLT 250 09/08/2019   GLUCOSE 232 (H) 09/08/2019   CHOL 210 (H) 09/08/2019   TRIG 93 09/08/2019   HDL 48 09/08/2019   LDLCALC 145 (H) 09/08/2019   ALT 26 09/08/2019   AST 24 09/08/2019   NA 137 09/08/2019   K 4.1 09/08/2019   CL 97 09/08/2019   CREATININE 0.87 09/08/2019   BUN 21 09/08/2019   CO2 27 09/08/2019   TSH 1.69 10/16/2018   INR 1.0 10/18/2018   HGBA1C 7.9 (H) 07/21/2018     BNP (last  3 results) No results for input(s): BNP in the last 8760 hours.  ProBNP (last 3 results) Recent Labs    10/16/18 1039 01/27/19 1129  PROBNP 47.0 61.0     Other Studies Reviewed Today:   Assessment/Plan:    Current medicines are reviewed with the patient today.  The patient does not have concerns regarding medicines other than what has been noted above.  The following changes have been made:  See above.  Labs/ tests ordered today include:   No orders of the defined types were placed in this encounter.    Disposition:   FU with *** in {gen number 4-66:599357} {Days to years:10300}.   Patient is agreeable to this plan and will call if any problems develop in the interim.   SignedTruitt Merle, NP  09/29/2019 7:39 AM  [place] [address] [place], [place]  [address] Phone: [phone] Fax: [phone]

## 2019-10-02 ENCOUNTER — Other Ambulatory Visit: Payer: Self-pay | Admitting: Cardiovascular Disease

## 2019-10-06 ENCOUNTER — Ambulatory Visit: Payer: PPO | Admitting: Nurse Practitioner

## 2019-10-07 ENCOUNTER — Other Ambulatory Visit: Payer: Self-pay

## 2019-10-07 ENCOUNTER — Encounter: Payer: Self-pay | Admitting: Cardiovascular Disease

## 2019-10-07 ENCOUNTER — Ambulatory Visit: Payer: PPO | Admitting: Cardiovascular Disease

## 2019-10-07 VITALS — BP 154/68 | HR 58 | Ht 67.0 in | Wt 155.6 lb

## 2019-10-07 DIAGNOSIS — I739 Peripheral vascular disease, unspecified: Secondary | ICD-10-CM

## 2019-10-07 DIAGNOSIS — R109 Unspecified abdominal pain: Secondary | ICD-10-CM | POA: Diagnosis not present

## 2019-10-07 DIAGNOSIS — I10 Essential (primary) hypertension: Secondary | ICD-10-CM

## 2019-10-07 DIAGNOSIS — E785 Hyperlipidemia, unspecified: Secondary | ICD-10-CM

## 2019-10-07 DIAGNOSIS — I25118 Atherosclerotic heart disease of native coronary artery with other forms of angina pectoris: Secondary | ICD-10-CM

## 2019-10-07 DIAGNOSIS — I779 Disorder of arteries and arterioles, unspecified: Secondary | ICD-10-CM

## 2019-10-07 NOTE — Progress Notes (Signed)
Cardiology Office Note   Date:  10/07/2019   ID:  CACHE BILLS, DOB 1940/04/20, MRN [id number]  PCP:  Angelina Sheriff, MD  Cardiologist:  Dr. Johnsie Cancel  No chief complaint on file.     History of Present Illness: Ricardo Le is a 79 y.o. male who presents for a follow up visit regarding PAD. He has known history of coronary artery disease status post coronary artery bypass grafting in 2009 .  Cardiac cath in 2017, 2019 and 2020 showed patent grafts with diffuse distal disease.  Other past medical history include remote history of pancreatic cancer which was treated successfully. He is status post left common iliac artery stent placement in March 2016. He is known to have bilateral SFA occlusion with 1 vessel runoff below the knee bilaterally. He had previous neck surgery in 2016 with improvement in his neuropathic pain. He was hospitalized in June, 2020 with small subarachnoid hemorrhage which did not require intervention.  He had multiple falls last year.  He was diagnosed with pulmonary embolism and right lower extremity DVT in September of last year and was placed on anticoagulation after being cleared by neurosurgery.  He took anticoagulation for few months and then it was discontinued.  Most recent noninvasive vascular studies in October showed an ABI of 0.93 on the right and noncompressible vessel on the left.  Aortoiliac duplex was very limited but there was possible occlusion of the left common iliac artery stent.  The patient's biggest issue recently has been abdominal pain of unclear etiology.  He was seen by vascular surgery at [facility] for possible chronic mesenteric ischemia.  His abdominal pain was occasionally postprandial but also worse with walking and partially relieved by nitroglycerin.  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  He did undergo mesenteric angiogram via a left brachial artery cutdown.  This  showed no significant stenosis affecting the celiac artery or superior mesenteric artery.  He reports abdominal pain that happens 3 hours after he falls asleep.  It improves after he eats some crackers.  In addition, he gets some exertional abdominal pain.  No significant improvement with Imdur.  Past Medical History:  Diagnosis Date  . CAD (coronary artery disease)    a. 04/2007 s/p CABG x 4  - LIMA->LAD, VG->OM1->Om2, VG->PDA;  b. 12/2008 & 06/2010 Caths - Native multivessel dzs with 4/4 patent grafts. c. cath 03/09/2015 patent grafts, unchanged from prior cath.  . Cancer [facility])    pancreatic  March 2011  . Carotid bruit    a. 07/2010 U/S- 0-39% bilat ICA stenosis  . Chest pain    Noncardiac probably related to reflux  . DDD (degenerative disc disease)    several surgeries  . Fracture acetabulum-closed ([place]) 07/08/2018  . GERD (gastroesophageal reflux disease)   . HNP (herniated nucleus pulposus), lumbar    a. L2-3, s/p laminotomy, microdiskectomy 02/2009  . HOH (hard of hearing)    uses amplifiers   . Nephrolithiasis   . PAD (peripheral artery disease) ([place]) 04/2014   s/p L Common Iliac Stent  . Palpitations   . 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 Pneumonia    hx of  pna  . Pure hypercholesterolemia    statin intolerance  . Type II or unspecified type diabetes mellitus without mention of complication, not stated as uncontrolled    insulin dependent  .  Unspecified essential hypertension     Past Surgical History:  Procedure Laterality Date  . ABDOMINAL AORTAGRAM N/A 05/13/2014   Procedure: ABDOMINAL [name];  Surgeon: Wellington Hampshire, MD;  Location: [place] CATH LAB;  Service: Cardiovascular;  Laterality: N/A;  . ANTERIOR CERVICAL DECOMP/DISCECTOMY FUSION N/A 07/28/2014   Procedure: ANTERIOR CERVICAL DECOMPRESSION/DISCECTOMY FUSION CERVICAL FOUR-FIVE CERVICAL FIVE-SIX ;  Surgeon: Earnie Larsson, MD;  Location: [place] NEURO ORS;  Service:  Neurosurgery;  Laterality: N/A;  . APPENDECTOMY    . arthroscopy right knee    . BACK SURGERY    . CARDIAC CATHETERIZATION N/A 03/09/2015   Procedure: Left Heart Cath and Cors/Grafts Angiography;  Surgeon: [name] Sine, MD;  Location: [place] CV LAB;  Service: Cardiovascular;  Laterality: N/A;  . CHOLECYSTECTOMY    . CORONARY ARTERY BYPASS GRAFT     X 4 2009 (LIMA to LAD, SVG to first cicumflex marginal branch with sequential SVG to second cicumflex marginal branch  and saphenous vein to posterior descending coronary artery with endoscopic,vein harvest rt. lower exttremity by Dr.Owen on March 31,2009  . CYST REMOVED     FROM SPINE  . CYSTOURETHROSCOPY     right retrograde pyelogram,manipulate stone in the renal pelvis, rt. double-j catheter.  [name] Kitchen EYE SURGERY    . KNEE SURGERY     rt knee  . LEFT HEART CATH AND CORS/GRAFTS ANGIOGRAPHY N/A 02/20/2017   Procedure: LEFT HEART CATH AND CORS/GRAFTS ANGIOGRAPHY;  Surgeon: Leonie Man, MD;  Location: [place] CV LAB;  Service: Cardiovascular;  Laterality: N/A;  . LEFT HEART CATH AND CORS/GRAFTS ANGIOGRAPHY N/A 05/28/2018   Procedure: LEFT HEART CATH AND CORS/GRAFTS ANGIOGRAPHY;  Surgeon: Burnell Blanks, MD;  Location: [place] CV LAB;  Service: Cardiovascular;  Laterality: N/A;  . NEPHROLITHOTOMY     PERCUTANEOUS  . PARS PLANA VITRECTOMY     lt. eye,retinal photocoagulation lt. eye, membrane peel lt. eye.  [name] Kitchen PERCUTANEOUS STENT INTERVENTION Left 05/13/2014   Procedure: PERCUTANEOUS STENT INTERVENTION;  Surgeon: Wellington Hampshire, MD;  Location: [place] CATH LAB;  Service: Cardiovascular;  Laterality: Left;  COMMON ILLIAC  . re-exploratin of laminectomy  05/2008   (RT L2-3) WITH REDO MICRODISKECTOMY     Current Outpatient Medications  Medication Sig Dispense Refill  . acetaminophen (TYLENOL) 325 MG tablet Take 650 mg by mouth every 6 (six) hours as needed (for pain).     [name] Kitchen amLODipine (NORVASC) 10 MG tablet Take 1 tablet (10 mg  total) by mouth daily. 30 tablet 1  . aspirin EC 81 MG tablet Take 81 mg by mouth daily.    [name] Kitchen atorvastatin (LIPITOR) 20 MG tablet Take 1 tablet (20 mg total) by mouth at bedtime. 30 tablet 1  . BD PEN NEEDLE NANO 2ND GEN 32G X 4 MM MISC See admin instructions.    . bethanechol (URECHOLINE) 25 MG tablet Take 25 mg by mouth 2 (two) times daily.    . bisacodyl (DULCOLAX) 5 MG EC tablet Take 5 mg by mouth every 3 (three) days.     . diphenhydrAMINE (BENADRYL) 50 MG tablet Take one 50 mg tablet one hour before the test with the last dose of the prednisone. 1 tablet 0  . docusate sodium (COLACE) 100 MG capsule Take 1 capsule (100 mg total) by mouth 2 (two) times daily. 10 capsule 0  . esomeprazole (NEXIUM) 40 MG capsule Take 30- 60 min before your first and last meals of the day 60 capsule 2  . ezetimibe (ZETIA) 10  MG tablet Take 1 tablet (10 mg total) by mouth daily. 90 tablet 3  . gabapentin (NEURONTIN) 300 MG capsule Take 300 mg by mouth 3 (three) times daily.    [name] Kitchen glimepiride (AMARYL) 2 MG tablet Take 4 mg by mouth daily.    [name] Kitchen glipiZIDE (GLUCOTROL) 5 MG tablet Take 0.5 tablets (2.5 mg total) by mouth 2 (two) times daily before a meal. 60 tablet 0  . Insulin Glargine (LANTUS) 100 UNIT/ML Solostar Pen Inject 18 Units into the skin at bedtime. 15 mL 11  . isosorbide mononitrate (IMDUR) 30 MG 24 hr tablet Take 0.5 tablets (15 mg total) by mouth daily. 45 tablet 3  . lisinopril-hydrochlorothiazide (ZESTORETIC) 20-12.5 MG tablet Take 1 tablet by mouth every morning.    . Melatonin 3 MG TABS Take 1 tablet (3 mg total) by mouth at bedtime. 30 tablet 0  . Multiple Vitamin (MULTIVITAMIN WITH MINERALS) TABS tablet Take 1 tablet by mouth daily.    . nitroGLYCERIN (NITROSTAT) 0.4 MG SL tablet PLACE ONE TABLET UNDER THE TONGUE EVERY 5 MINUTES AS NEEDED FOR CHEST PAIN 25 tablet 6  . ONETOUCH VERIO test strip 2 (two) times daily.    . pantoprazole (PROTONIX) 40 MG tablet Take 40 mg by mouth 2 (two) times daily.     [name] Kitchen senna (SENOKOT) 8.6 MG TABS tablet Take 1 tablet (8.6 mg total) by mouth at bedtime as needed for mild constipation. 120 each 0  . sucralfate (CARAFATE) 1 g tablet Take 1 g by mouth 2 (two) times daily.    . tamsulosin (FLOMAX) 0.4 MG CAPS capsule Take 1 capsule (0.4 mg total) by mouth daily after supper. 30 capsule 0   No current facility-administered medications for this visit.    Allergies:   Oxycodone hcl, Ranexa [ranolazine], Rosuvastatin calcium, Rosuvastatin calcium, Tramadol, Iohexol, and Statins    Social History:  The patient  reports that he has never smoked. He has never used smokeless tobacco. He reports that he does not drink alcohol and does not use drugs.   Family History:  The patient's family history includes Coronary artery disease in his brother and sister; Diabetes in his brother; Heart attack in his father; Other in his mother and another family member.    ROS:  Please see the history of present illness.   Otherwise, review of systems are positive for none.   All other systems are reviewed and negative.    PHYSICAL EXAM: VS:  BP (!) 154/68   Pulse (!) 58   Ht 5\' 7"  (1.702 m)   Wt 155 lb 9.6 oz (70.6 kg)   BMI 24.37 kg/m  , BMI Body mass index is 24.37 kg/m. GEN: Well nourished, well developed, in no acute distress  HEENT: normal  Neck: no JVD, or masses. Bilateral carotid bruit.  Cardiac: RRR; no  rubs, or gallops,no edema . 2/6 SEM at the base.  Respiratory:  clear to auscultation bilaterally, normal work of breathing GI: soft, nontender, nondistended, + BS MS: no deformity or atrophy  Skin: warm and dry, no rash Neuro:  Strength and sensation are intact Psych: euthymic mood, full affect Vascular: Femoral pulses: +2 on the right and +1 on the left.  EKG:  EKG is not ordered today.   Recent Labs: 10/16/2018: TSH 1.69 01/27/2019: Pro B Natriuretic peptide (BNP) 61.0 09/08/2019: ALT 26; BUN 21; Creatinine, Ser 0.87; Hemoglobin 13.3; Platelets 250;  Potassium 4.1; Sodium 137    Lipid Panel    Component Value Date/Time  CHOL 210 (H) 09/08/2019 1506   TRIG 93 09/08/2019 1506   HDL 48 09/08/2019 1506   CHOLHDL 4.4 09/08/2019 1506   CHOLHDL 3.4 10/19/2018 0227   VLDL 15 10/19/2018 0227   LDLCALC 145 (H) 09/08/2019 1506      Wt Readings from Last 3 Encounters:  10/07/19 155 lb 9.6 oz (70.6 kg)  09/08/19 155 lb (70.3 kg)  06/10/19 160 lb 12.8 oz (72.9 kg)         ASSESSMENT AND PLAN:  1.  Peripheral arterial disease: Status post left iliac artery stenting. He is known to have bilateral SFA occlusion.  Recent noninvasive vascular studies suggested possible left common iliac artery stent occlusion.  However, the patient has a palpable left femoral artery pulse which makes it unlikely that the stent is occluded.  He reports no significant leg claudication at the present time.  2. Coronary artery disease involving native coronary arteries with other forms of angina: Cardiac catheterization in April 2020 was reviewed again.  It showed severe native coronary artery disease with patent grafts.  He does have branching disease but no targets for revascularization.  3. Hyperlipidemia: Continue treatment with atorvastatin with a target LDL of less than 70.  4. Essential hypertension: Blood pressure is reasonably controlled.  5. Bilateral carotid disease: Carotid Doppler in October showed mild nonobstructive disease.  6.  Abdominal pain: No evidence of chronic mesenteric ischemia by  angiography.  He continues to be extremely frustrated with this.  I do not see a clear cardiovascular etiology of this.  He is requesting a second opinion from GI and a referral was made to [facility].  I explained to him that we might not be able to find a clear explanation for his symptoms.  Some of his symptoms might be neuropathic related to previous abdominal surgery.   Disposition:   FU with me in 6 months  Signed, Kathlyn Sacramento, MD  10/07/2019  8:57 AM    [place]

## 2019-10-07 NOTE — Patient Instructions (Signed)
Medication Instructions:  No changes *If you need a refill on your cardiac medications before your next appointment, please call your pharmacy*   Lab Work: None ordered If you have labs (blood work) drawn today and your tests are completely normal, you will receive your results only by:  [place] (if you have MyChart) OR  A paper copy in the mail If you have any lab test that is abnormal or we need to change your treatment, we will call you to review the results.   Testing/Procedures: None ordered   Follow-Up: At [facility], you and your health needs are our priority.  As part of our continuing mission to provide you with exceptional heart care, we have created designated Provider Care Teams.  These Care Teams include your primary Cardiologist (physician) and Advanced Practice Providers (APPs -  Physician Assistants and Nurse Practitioners) who all work together to provide you with the care you need, when you need it.  We recommend signing up for the patient portal called "MyChart".  Sign up information is provided on this After Visit Summary.  MyChart is used to connect with patients for Virtual Visits (Telemedicine).  Patients are able to view lab/test results, encounter notes, upcoming appointments, etc.  Non-urgent messages can be sent to your provider as well.   To learn more about what you can do with MyChart, go to [email or web address].    Your next appointment:   6 month(s)  The format for your next appointment:   In Person  Provider:   Kathlyn Sacramento, MD   Other Instructions A referral has been placed to [facility]-they will call you for the appointment.

## 2019-10-08 DIAGNOSIS — Z794 Long term (current) use of insulin: Secondary | ICD-10-CM | POA: Diagnosis not present

## 2019-10-08 DIAGNOSIS — E113291 Type 2 diabetes mellitus with mild nonproliferative diabetic retinopathy without macular edema, right eye: Secondary | ICD-10-CM | POA: Diagnosis not present

## 2019-10-08 DIAGNOSIS — H527 Unspecified disorder of refraction: Secondary | ICD-10-CM | POA: Diagnosis not present

## 2019-10-08 DIAGNOSIS — Z961 Presence of intraocular lens: Secondary | ICD-10-CM | POA: Diagnosis not present

## 2019-10-08 DIAGNOSIS — H353121 Nonexudative age-related macular degeneration, left eye, early dry stage: Secondary | ICD-10-CM | POA: Diagnosis not present

## 2019-10-30 DIAGNOSIS — R1013 Epigastric pain: Secondary | ICD-10-CM | POA: Diagnosis not present

## 2019-10-30 DIAGNOSIS — C7A8 Other malignant neuroendocrine tumors: Secondary | ICD-10-CM | POA: Diagnosis not present

## 2019-10-30 DIAGNOSIS — R634 Abnormal weight loss: Secondary | ICD-10-CM | POA: Diagnosis not present

## 2019-11-04 DIAGNOSIS — R935 Abnormal findings on diagnostic imaging of other abdominal regions, including retroperitoneum: Secondary | ICD-10-CM | POA: Diagnosis not present

## 2019-11-04 DIAGNOSIS — Z8507 Personal history of malignant neoplasm of pancreas: Secondary | ICD-10-CM | POA: Diagnosis not present

## 2019-11-04 DIAGNOSIS — R1013 Epigastric pain: Secondary | ICD-10-CM | POA: Diagnosis not present

## 2019-11-04 DIAGNOSIS — I700 Atherosclerosis of aorta: Secondary | ICD-10-CM | POA: Diagnosis not present

## 2019-11-04 DIAGNOSIS — K7689 Other specified diseases of liver: Secondary | ICD-10-CM | POA: Diagnosis not present

## 2019-11-04 DIAGNOSIS — C7A8 Other malignant neuroendocrine tumors: Secondary | ICD-10-CM | POA: Diagnosis not present

## 2019-11-12 ENCOUNTER — Ambulatory Visit: Payer: PPO | Admitting: Cardiovascular Disease

## 2019-11-17 DIAGNOSIS — R1013 Epigastric pain: Secondary | ICD-10-CM | POA: Diagnosis not present

## 2019-11-17 DIAGNOSIS — Z6825 Body mass index (BMI) 25.0-25.9, adult: Secondary | ICD-10-CM | POA: Diagnosis not present

## 2019-11-17 DIAGNOSIS — Z951 Presence of aortocoronary bypass graft: Secondary | ICD-10-CM | POA: Diagnosis not present

## 2019-11-17 DIAGNOSIS — R634 Abnormal weight loss: Secondary | ICD-10-CM | POA: Diagnosis not present

## 2019-11-17 DIAGNOSIS — K6389 Other specified diseases of intestine: Secondary | ICD-10-CM | POA: Diagnosis not present

## 2019-11-17 DIAGNOSIS — I2510 Atherosclerotic heart disease of native coronary artery without angina pectoris: Secondary | ICD-10-CM | POA: Diagnosis not present

## 2019-11-17 DIAGNOSIS — K319 Disease of stomach and duodenum, unspecified: Secondary | ICD-10-CM | POA: Diagnosis not present

## 2019-11-17 DIAGNOSIS — R109 Unspecified abdominal pain: Secondary | ICD-10-CM | POA: Diagnosis not present

## 2019-11-19 ENCOUNTER — Telehealth: Payer: Self-pay | Admitting: Cardiovascular Disease

## 2019-11-19 DIAGNOSIS — Z23 Encounter for immunization: Secondary | ICD-10-CM | POA: Diagnosis not present

## 2019-11-19 DIAGNOSIS — Z0000 Encounter for general adult medical examination without abnormal findings: Secondary | ICD-10-CM | POA: Diagnosis not present

## 2019-11-19 DIAGNOSIS — R1013 Epigastric pain: Secondary | ICD-10-CM | POA: Diagnosis not present

## 2019-11-19 DIAGNOSIS — I259 Chronic ischemic heart disease, unspecified: Secondary | ICD-10-CM | POA: Diagnosis not present

## 2019-11-19 DIAGNOSIS — F419 Anxiety disorder, unspecified: Secondary | ICD-10-CM | POA: Diagnosis not present

## 2019-11-19 DIAGNOSIS — Z6823 Body mass index (BMI) 23.0-23.9, adult: Secondary | ICD-10-CM | POA: Diagnosis not present

## 2019-11-19 NOTE — Telephone Encounter (Signed)
Left message for patient's wife to call back.  

## 2019-11-19 NOTE — Telephone Encounter (Signed)
He should see her primary care physician to address these issues.  He had an angiogram done at [facility] that showed no significant blockages in the mesenteric arteries.

## 2019-11-19 NOTE — Telephone Encounter (Signed)
Spoke with the patient's wife who states that the patient was previously told that he had a blockage somewhere leading to his stomach. She states that they went in to fix it but at that time found that there was not a significant blockage so nothing was done. I advised her that this was correct and no significant steonsis was found affecting arteries in abdomen.  She reports at last office visit with Dr. [name] it was advised that the patient follow up with [facility] however the patient has apparently been dismissed from the practice for unknown reason. The patient has gotten in with [facility]. She reports that the patient had a EGD on Monday and nothing was found, however they are waiting on results from stomach biopsy. She states that he is still having abdominal pain. She would like to know what to do next and is still concerned about blockages that were seen previously. Advised that the patient should continue to follow up with [facility] and that I would let Dr. [name] know and if he had any further recommendations that we would contact them.

## 2019-11-19 NOTE — Telephone Encounter (Signed)
Spoke to patient's wife Dr.Arida'a advice given.Stated husband has appointment with PCP this afternoon.

## 2019-11-19 NOTE — Telephone Encounter (Signed)
Pt's wife returning call.

## 2019-11-19 NOTE — Telephone Encounter (Signed)
Patient wants to speak to Ricardo Le about her husbands ultrasound of his stomach that he had. Aware she is off today.

## 2019-11-24 DIAGNOSIS — R109 Unspecified abdominal pain: Secondary | ICD-10-CM | POA: Diagnosis not present

## 2019-11-24 DIAGNOSIS — R197 Diarrhea, unspecified: Secondary | ICD-10-CM | POA: Diagnosis not present

## 2019-12-08 ENCOUNTER — Other Ambulatory Visit: Payer: Self-pay

## 2019-12-08 ENCOUNTER — Ambulatory Visit (HOSPITAL_COMMUNITY)
Admission: RE | Admit: 2019-12-08 | Discharge: 2019-12-08 | Disposition: A | Discharge status: Home / Self Care | Payer: PPO | Source: Ambulatory Visit | Attending: Cardiology | Admitting: Cardiology

## 2019-12-08 DIAGNOSIS — I6523 Occlusion and stenosis of bilateral carotid arteries: Secondary | ICD-10-CM | POA: Insufficient documentation

## 2019-12-09 DIAGNOSIS — R1013 Epigastric pain: Secondary | ICD-10-CM | POA: Diagnosis not present

## 2019-12-09 DIAGNOSIS — C7A8 Other malignant neuroendocrine tumors: Secondary | ICD-10-CM | POA: Diagnosis not present

## 2019-12-09 DIAGNOSIS — K219 Gastro-esophageal reflux disease without esophagitis: Secondary | ICD-10-CM | POA: Diagnosis not present

## 2019-12-10 DIAGNOSIS — R339 Retention of urine, unspecified: Secondary | ICD-10-CM | POA: Diagnosis not present

## 2019-12-10 DIAGNOSIS — N401 Enlarged prostate with lower urinary tract symptoms: Secondary | ICD-10-CM | POA: Diagnosis not present

## 2019-12-10 DIAGNOSIS — N390 Urinary tract infection, site not specified: Secondary | ICD-10-CM | POA: Diagnosis not present

## 2019-12-10 DIAGNOSIS — R972 Elevated prostate specific antigen [PSA]: Secondary | ICD-10-CM | POA: Diagnosis not present

## 2019-12-17 ENCOUNTER — Other Ambulatory Visit: Payer: Self-pay

## 2019-12-17 ENCOUNTER — Encounter (INDEPENDENT_AMBULATORY_CARE_PROVIDER_SITE_OTHER): Payer: PPO | Admitting: Ophthalmology

## 2019-12-17 DIAGNOSIS — I10 Essential (primary) hypertension: Secondary | ICD-10-CM | POA: Diagnosis not present

## 2019-12-17 DIAGNOSIS — E11319 Type 2 diabetes mellitus with unspecified diabetic retinopathy without macular edema: Secondary | ICD-10-CM | POA: Diagnosis not present

## 2019-12-17 DIAGNOSIS — E113593 Type 2 diabetes mellitus with proliferative diabetic retinopathy without macular edema, bilateral: Secondary | ICD-10-CM | POA: Diagnosis not present

## 2019-12-17 DIAGNOSIS — H35033 Hypertensive retinopathy, bilateral: Secondary | ICD-10-CM | POA: Diagnosis not present

## 2019-12-26 DIAGNOSIS — N390 Urinary tract infection, site not specified: Secondary | ICD-10-CM | POA: Diagnosis not present

## 2019-12-30 DIAGNOSIS — I700 Atherosclerosis of aorta: Secondary | ICD-10-CM | POA: Diagnosis not present

## 2019-12-30 DIAGNOSIS — J9811 Atelectasis: Secondary | ICD-10-CM | POA: Diagnosis not present

## 2019-12-30 DIAGNOSIS — E860 Dehydration: Secondary | ICD-10-CM | POA: Diagnosis not present

## 2019-12-30 DIAGNOSIS — N200 Calculus of kidney: Secondary | ICD-10-CM | POA: Diagnosis not present

## 2019-12-30 DIAGNOSIS — R100 Acute abdomen: Secondary | ICD-10-CM | POA: Diagnosis not present

## 2019-12-30 DIAGNOSIS — N400 Enlarged prostate without lower urinary tract symptoms: Secondary | ICD-10-CM | POA: Diagnosis not present

## 2019-12-30 DIAGNOSIS — R109 Unspecified abdominal pain: Secondary | ICD-10-CM | POA: Diagnosis not present

## 2019-12-30 DIAGNOSIS — N3289 Other specified disorders of bladder: Secondary | ICD-10-CM | POA: Diagnosis not present

## 2019-12-30 DIAGNOSIS — R0600 Dyspnea, unspecified: Secondary | ICD-10-CM | POA: Diagnosis not present

## 2019-12-30 DIAGNOSIS — N3001 Acute cystitis with hematuria: Secondary | ICD-10-CM | POA: Diagnosis not present

## 2019-12-30 DIAGNOSIS — R1084 Generalized abdominal pain: Secondary | ICD-10-CM | POA: Diagnosis not present

## 2019-12-30 DIAGNOSIS — D72829 Elevated white blood cell count, unspecified: Secondary | ICD-10-CM | POA: Diagnosis not present

## 2019-12-30 DIAGNOSIS — N390 Urinary tract infection, site not specified: Secondary | ICD-10-CM | POA: Diagnosis not present

## 2019-12-30 DIAGNOSIS — R0602 Shortness of breath: Secondary | ICD-10-CM | POA: Diagnosis not present

## 2019-12-30 DIAGNOSIS — A419 Sepsis, unspecified organism: Secondary | ICD-10-CM | POA: Diagnosis not present

## 2020-01-02 DIAGNOSIS — I259 Chronic ischemic heart disease, unspecified: Secondary | ICD-10-CM | POA: Diagnosis not present

## 2020-01-02 DIAGNOSIS — K589 Irritable bowel syndrome without diarrhea: Secondary | ICD-10-CM | POA: Diagnosis not present

## 2020-01-02 DIAGNOSIS — R1013 Epigastric pain: Secondary | ICD-10-CM | POA: Diagnosis not present

## 2020-01-02 DIAGNOSIS — Z6823 Body mass index (BMI) 23.0-23.9, adult: Secondary | ICD-10-CM | POA: Diagnosis not present

## 2020-01-12 DIAGNOSIS — N390 Urinary tract infection, site not specified: Secondary | ICD-10-CM | POA: Diagnosis not present

## 2020-01-12 IMAGING — DX DG CHEST 2V
2 series · 2 of 2 positions shown · non-contrast
Comparison: July 20, 2018
COMPARISON: July 20, 2018

Addendum:
CLINICAL DATA: Shortness of breath. Dyspnea on exertion.

EXAM:
CHEST - 2 VIEW

[chest pa]
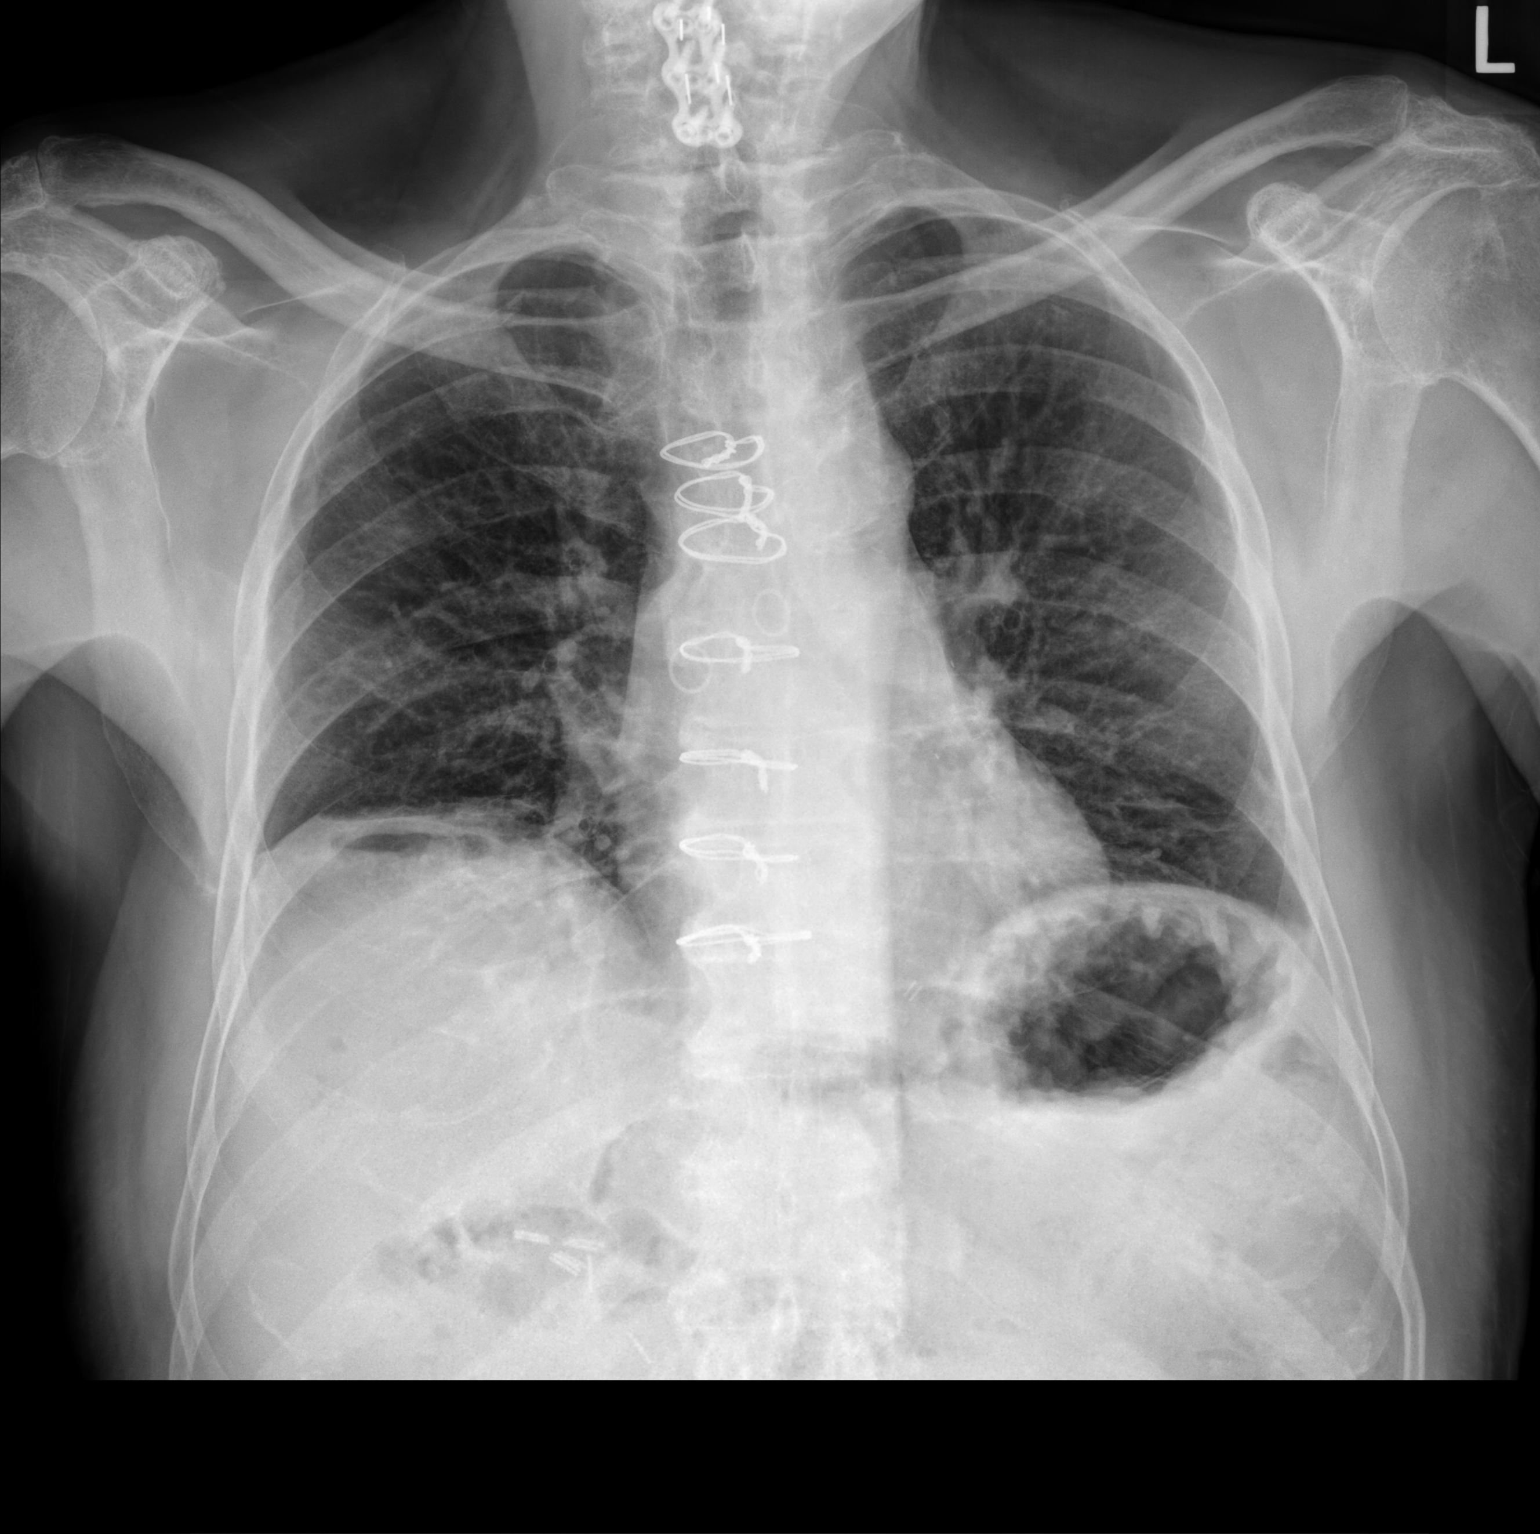

[chest lat]
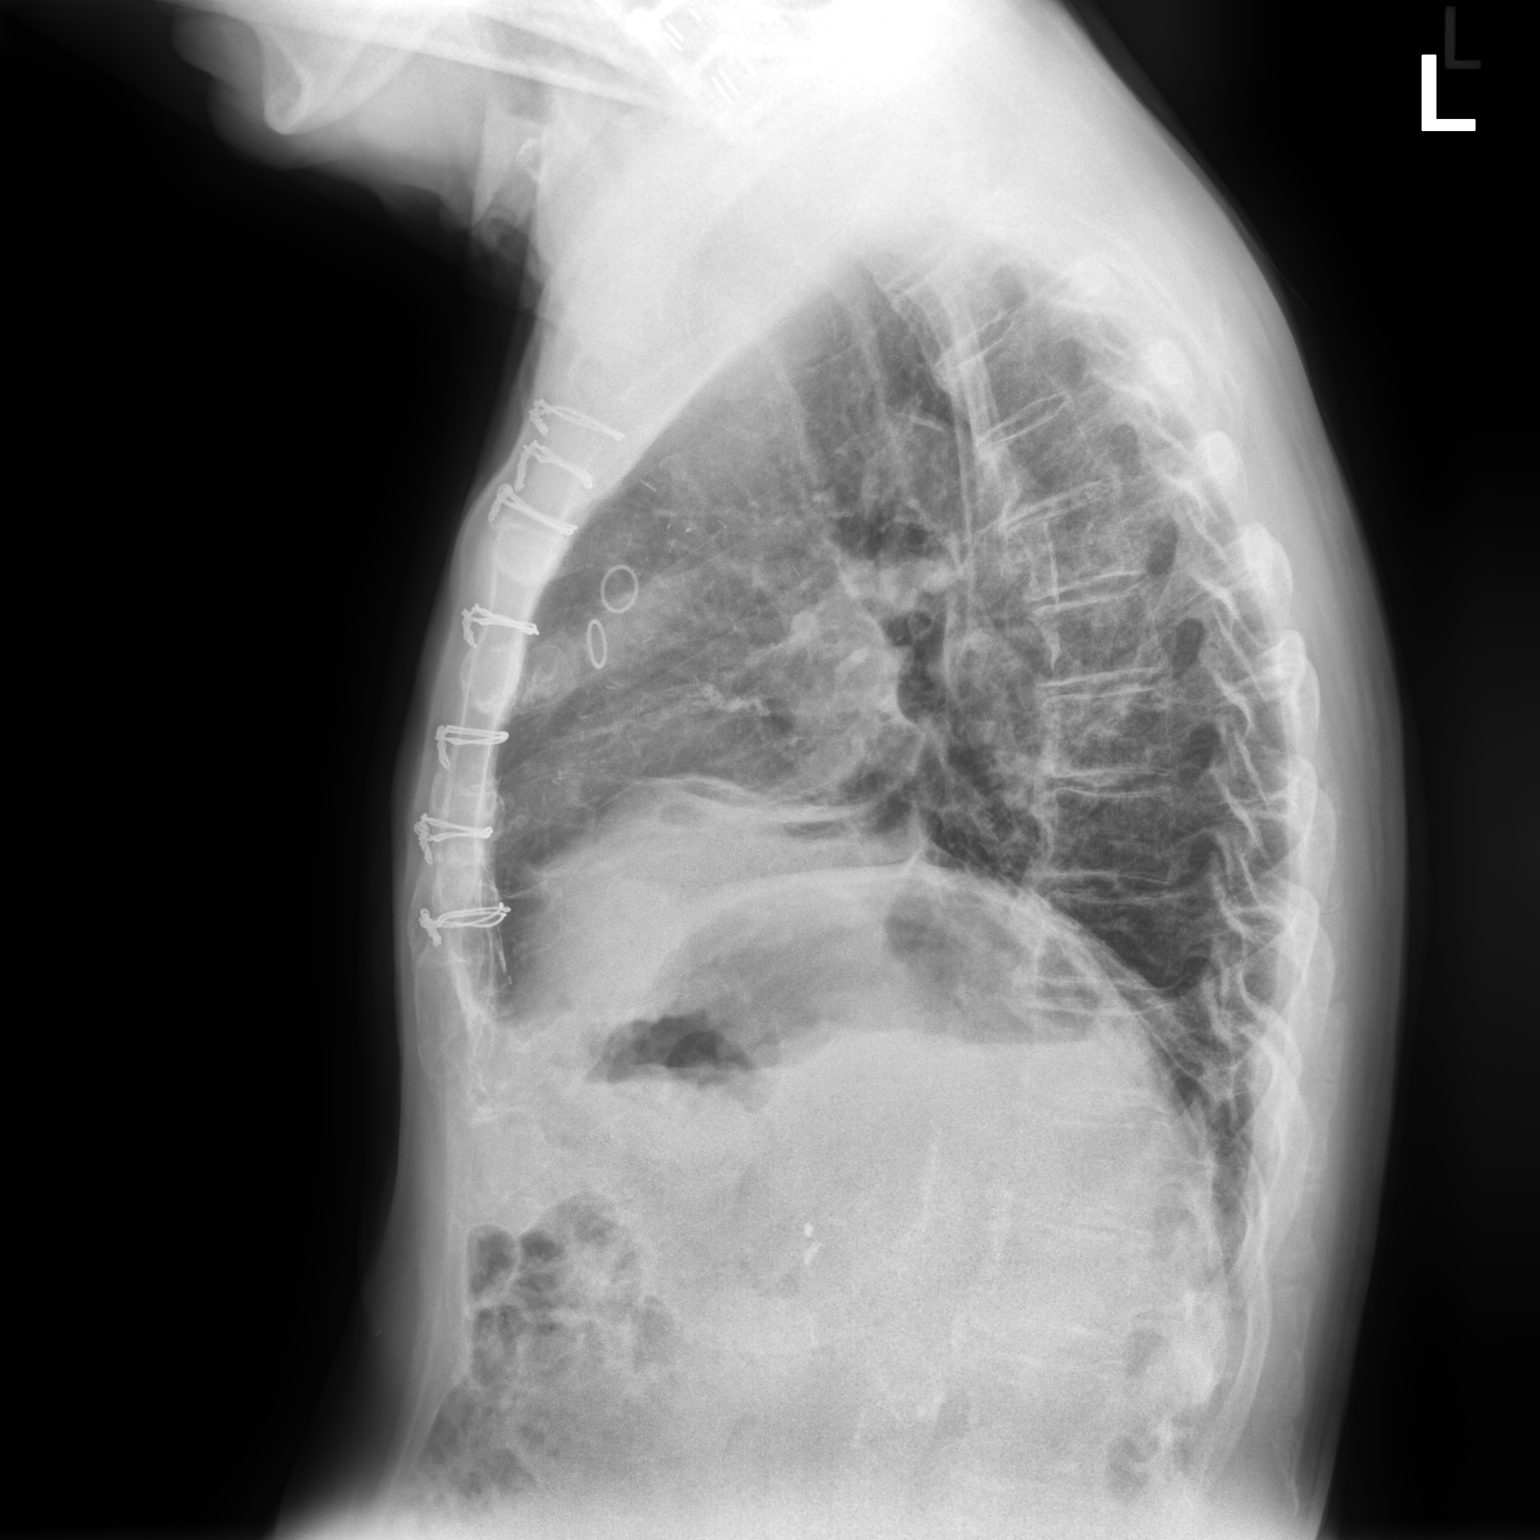

[2 of 2 positions shown; findings below may reference images not displayed]

FINDINGS: Cardiomediastinal silhouette is normal. Mediastinal contours appear
intact. Stable postsurgical changes from CABG. Stable elevation of
the right hemidiaphragm.

There is no evidence of pleural effusion or pneumothorax. Right
lower lobe atelectasis versus less likely airspace consolidation.

Osseous structures are without acute abnormality. Soft tissues are
grossly normal.
IMPRESSION: Right lower lobe atelectasis versus less likely airspace
consolidation.

ADDENDUM:
Additional right middle lobe atelectasis versus airspace
consolidation noted.

*** End of Addendum ***
FINDINGS: Cardiomediastinal silhouette is normal. Mediastinal contours appear
intact. Stable postsurgical changes from CABG. Stable elevation of
the right hemidiaphragm.

There is no evidence of pleural effusion or pneumothorax. Right
lower lobe atelectasis versus less likely airspace consolidation.

Osseous structures are without acute abnormality. Soft tissues are
grossly normal.
IMPRESSION: Right lower lobe atelectasis versus less likely airspace
consolidation.

## 2020-01-22 DIAGNOSIS — Z6823 Body mass index (BMI) 23.0-23.9, adult: Secondary | ICD-10-CM | POA: Diagnosis not present

## 2020-01-22 DIAGNOSIS — R300 Dysuria: Secondary | ICD-10-CM | POA: Diagnosis not present

## 2020-01-22 DIAGNOSIS — D519 Vitamin B12 deficiency anemia, unspecified: Secondary | ICD-10-CM | POA: Diagnosis not present

## 2020-01-22 DIAGNOSIS — K589 Irritable bowel syndrome without diarrhea: Secondary | ICD-10-CM | POA: Diagnosis not present

## 2020-01-22 DIAGNOSIS — Z125 Encounter for screening for malignant neoplasm of prostate: Secondary | ICD-10-CM | POA: Diagnosis not present

## 2020-01-22 DIAGNOSIS — R5381 Other malaise: Secondary | ICD-10-CM | POA: Diagnosis not present

## 2020-01-22 DIAGNOSIS — F419 Anxiety disorder, unspecified: Secondary | ICD-10-CM | POA: Diagnosis not present

## 2020-01-22 DIAGNOSIS — Z79899 Other long term (current) drug therapy: Secondary | ICD-10-CM | POA: Diagnosis not present

## 2020-01-22 DIAGNOSIS — R5383 Other fatigue: Secondary | ICD-10-CM | POA: Diagnosis not present

## 2020-01-27 ENCOUNTER — Encounter (HOSPITAL_COMMUNITY): Payer: Self-pay | Admitting: Emergency Medicine

## 2020-01-27 ENCOUNTER — Other Ambulatory Visit: Payer: Self-pay

## 2020-01-27 ENCOUNTER — Emergency Department (HOSPITAL_COMMUNITY)
Admission: EM | Admit: 2020-01-27 | Discharge: 2020-01-27 | Disposition: A | Discharge status: Home / Self Care | Payer: PPO | Attending: Emergency Medicine | Admitting: Emergency Medicine

## 2020-01-27 DIAGNOSIS — Z5321 Procedure and treatment not carried out due to patient leaving prior to being seen by health care provider: Secondary | ICD-10-CM | POA: Insufficient documentation

## 2020-01-27 DIAGNOSIS — R110 Nausea: Secondary | ICD-10-CM | POA: Diagnosis not present

## 2020-01-27 DIAGNOSIS — R1084 Generalized abdominal pain: Secondary | ICD-10-CM | POA: Diagnosis not present

## 2020-01-27 DIAGNOSIS — I959 Hypotension, unspecified: Secondary | ICD-10-CM | POA: Diagnosis not present

## 2020-01-27 DIAGNOSIS — R0789 Other chest pain: Secondary | ICD-10-CM | POA: Diagnosis not present

## 2020-01-27 DIAGNOSIS — R109 Unspecified abdominal pain: Secondary | ICD-10-CM | POA: Insufficient documentation

## 2020-01-27 DIAGNOSIS — R197 Diarrhea, unspecified: Secondary | ICD-10-CM | POA: Insufficient documentation

## 2020-01-27 DIAGNOSIS — R079 Chest pain, unspecified: Secondary | ICD-10-CM | POA: Diagnosis not present

## 2020-01-27 LAB — CBC
HCT: 38.6 % — ABNORMAL LOW (ref 39.0–52.0)
Hemoglobin: 13.4 g/dL (ref 13.0–17.0)
MCH: 31.8 pg (ref 26.0–34.0)
MCHC: 34.7 g/dL (ref 30.0–36.0)
MCV: 91.7 fL (ref 80.0–100.0)
Platelets: 256 10*3/uL (ref 150–400)
RBC: 4.21 MIL/uL — ABNORMAL LOW (ref 4.22–5.81)
RDW: 13.2 % (ref 11.5–15.5)
WBC: 7.4 10*3/uL (ref 4.0–10.5)
nRBC: 0 % (ref 0.0–0.2)

## 2020-01-27 LAB — COMPREHENSIVE METABOLIC PANEL
ALT: 36 U/L (ref 0–44)
AST: 28 U/L (ref 15–41)
Albumin: 3.6 g/dL (ref 3.5–5.0)
Alkaline Phosphatase: 60 U/L (ref 38–126)
Anion gap: 13 (ref 5–15)
BUN: 15 mg/dL (ref 8–23)
CO2: 26 mmol/L (ref 22–32)
Calcium: 9.4 mg/dL (ref 8.9–10.3)
Chloride: 97 mmol/L — ABNORMAL LOW (ref 98–111)
Creatinine, Ser: 1.02 mg/dL (ref 0.61–1.24)
GFR, Estimated: >60 mL/min (ref >60)
Glucose, Bld: 109 mg/dL — ABNORMAL HIGH (ref 70–99)
Potassium: 3.5 mmol/L (ref 3.5–5.1)
Sodium: 136 mmol/L (ref 135–145)
Total Bilirubin: 0.8 mg/dL (ref 0.3–1.2)
Total Protein: 7.2 g/dL (ref 6.5–8.1)

## 2020-01-27 LAB — URINALYSIS, ROUTINE W REFLEX MICROSCOPIC
Bilirubin Urine: NEGATIVE
Glucose, UA: NEGATIVE mg/dL
Hgb urine dipstick: NEGATIVE
Ketones, ur: NEGATIVE mg/dL
Leukocytes,Ua: NEGATIVE
Nitrite: NEGATIVE
Protein, ur: NEGATIVE mg/dL
Specific Gravity, Urine: 1.009 (ref 1.005–1.030)
pH: 6 (ref 5.0–8.0)

## 2020-01-27 LAB — CBG MONITORING, ED: Glucose-Capillary: 143 mg/dL — ABNORMAL HIGH (ref 70–99)

## 2020-01-27 LAB — LIPASE, BLOOD: Lipase: 27 U/L (ref 11–51)

## 2020-01-27 NOTE — ED Triage Notes (Signed)
Patient arrived with EMS from home reports mid/low abdominal pain with nausea and diarrhea last night , denies emesis or fever .

## 2020-01-27 NOTE — ED Notes (Signed)
Pt stated he had to get home and take his nightly medications and did not want to wait longer to be seen. This NT informed pt that we wanted him to stay and get seen by EDP. Pt verbalized understanding, stated he would return if symptoms worsened.

## 2020-01-28 DIAGNOSIS — R972 Elevated prostate specific antigen [PSA]: Secondary | ICD-10-CM | POA: Diagnosis not present

## 2020-01-28 DIAGNOSIS — N390 Urinary tract infection, site not specified: Secondary | ICD-10-CM | POA: Diagnosis not present

## 2020-01-28 DIAGNOSIS — N401 Enlarged prostate with lower urinary tract symptoms: Secondary | ICD-10-CM | POA: Diagnosis not present

## 2020-01-28 DIAGNOSIS — R339 Retention of urine, unspecified: Secondary | ICD-10-CM | POA: Diagnosis not present

## 2020-02-02 ENCOUNTER — Telehealth: Payer: Self-pay | Admitting: Cardiovascular Disease

## 2020-02-02 NOTE — Telephone Encounter (Signed)
Patient is having some pain near his bladder area and is not able to walk hardly at all. The wife said this is a similar pain to what he had right before Dr. [name] put his stent in. Wife scheduled appt for Wed 02/04/20 at [facility] office

## 2020-02-04 ENCOUNTER — Encounter: Payer: Self-pay | Admitting: Cardiovascular Disease

## 2020-02-04 ENCOUNTER — Other Ambulatory Visit: Payer: Self-pay

## 2020-02-04 ENCOUNTER — Ambulatory Visit: Payer: PPO | Admitting: Cardiovascular Disease

## 2020-02-04 VITALS — BP 112/48 | HR 63 | Ht 67.0 in | Wt 153.2 lb

## 2020-02-04 DIAGNOSIS — I6523 Occlusion and stenosis of bilateral carotid arteries: Secondary | ICD-10-CM | POA: Diagnosis not present

## 2020-02-04 DIAGNOSIS — I10 Essential (primary) hypertension: Secondary | ICD-10-CM | POA: Diagnosis not present

## 2020-02-04 DIAGNOSIS — I739 Peripheral vascular disease, unspecified: Secondary | ICD-10-CM | POA: Diagnosis not present

## 2020-02-04 DIAGNOSIS — I25110 Atherosclerotic heart disease of native coronary artery with unstable angina pectoris: Secondary | ICD-10-CM | POA: Diagnosis not present

## 2020-02-04 DIAGNOSIS — E782 Mixed hyperlipidemia: Secondary | ICD-10-CM

## 2020-02-04 NOTE — Progress Notes (Signed)
Cardiology Office Note   Date:  02/04/2020   ID:  Ricardo Le, DOB September 16, 1940, MRN [id number]  PCP:  Noni Saupe, MD  Cardiologist:  Dr. Eden Emms  Chief Complaint  Patient presents with  . Peripheral Artery Disease     Patient c/o right leg pain when walking and abdominal pain. Meds reviewed by the patient verbally.       History of Present Illness: Ricardo Le is a 79 y.o. male who presents for a follow up visit regarding PAD. He has known history of coronary artery disease status post coronary artery bypass grafting in 2009 .  Cardiac cath in 2017, 2019 and 2020 showed patent grafts with diffuse distal disease.  Other past medical history include remote history of pancreatic cancer which was treated successfully. He is status post left common iliac artery stent placement in March 2016. He is known to have bilateral SFA occlusion with 1 vessel runoff below the knee bilaterally. He had previous neck surgery in 2016 with improvement in his neuropathic pain. He was hospitalized in June, 2020 with small subarachnoid hemorrhage which did not require intervention.  He had multiple falls last year.  He was diagnosed with pulmonary embolism and right lower extremity DVT in September of last year and was placed on anticoagulation after being cleared by neurosurgery.  He took anticoagulation for few months and then it was discontinued.  Most recent noninvasive vascular studies in October, 2020 showed an ABI of 0.93 on the right and noncompressible vessel on the left.  Aortoiliac duplex was very limited but there was possible occlusion of the left common iliac artery stent.  CTA of the abdominal aorta with runoff showed patent bilateral iliac stents. His biggest issue continues to be postprandial abdominal pain.  He was seen by vascular surgery at [facility] for possible chronic mesenteric ischemia.  His abdominal pain was occasionally postprandial but also worse with walking and  partially relieved by nitroglycerin.  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  He did undergo mesenteric angiogram via a left brachial artery cutdown in April of this year.  This showed no significant stenosis affecting the celiac artery or superior mesenteric artery. He is here to see me again for his frustration regarding his abdominal pain.  He has been seen by gastroenterology.  His wife seems to be extremely anxious about his symptoms thinking that he is not going to make it one day due to his symptoms.  He denies significant worsening of leg pain.  He has chronic right thigh discomfort with walking.  Past Medical History:  Diagnosis Date  . CAD (coronary artery disease)    a. 04/2007 s/p CABG x 4  - LIMA->LAD, VG->OM1->Om2, VG->PDA;  b. 12/2008 & 06/2010 Caths - Native multivessel dzs with 4/4 patent grafts. c. cath 03/09/2015 patent grafts, unchanged from prior cath.  . Cancer [facility])    pancreatic  March 2011  . Carotid bruit    a. 07/2010 U/S- 0-39% bilat ICA stenosis  . Chest pain    Noncardiac probably related to reflux  . DDD (degenerative disc disease)    several surgeries  . Fracture acetabulum-closed (HCC) 07/08/2018  . GERD (gastroesophageal reflux disease)   . HNP (herniated nucleus pulposus), lumbar    a. L2-3, s/p laminotomy, microdiskectomy 02/2009  . HOH (hard of hearing)    uses amplifiers   . Nephrolithiasis   . PAD (peripheral artery disease) (HCC) 04/2014  s/p L Common Iliac Stent  . Palpitations   . 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 Pneumonia    hx of  pna  . Pure hypercholesterolemia    statin intolerance  . Type II or unspecified type diabetes mellitus without mention of complication, not stated as uncontrolled    insulin dependent  . Unspecified essential hypertension     Past Surgical History:  Procedure Laterality Date  . ABDOMINAL AORTAGRAM N/A 05/13/2014    Procedure: ABDOMINAL [name];  Surgeon: Wellington Hampshire, MD;  Location: [place] CATH LAB;  Service: Cardiovascular;  Laterality: N/A;  . ANTERIOR CERVICAL DECOMP/DISCECTOMY FUSION N/A 07/28/2014   Procedure: ANTERIOR CERVICAL DECOMPRESSION/DISCECTOMY FUSION CERVICAL FOUR-FIVE CERVICAL FIVE-SIX ;  Surgeon: Earnie Larsson, MD;  Location: [place] NEURO ORS;  Service: Neurosurgery;  Laterality: N/A;  . APPENDECTOMY    . arthroscopy right knee    . BACK SURGERY    . CARDIAC CATHETERIZATION N/A 03/09/2015   Procedure: Left Heart Cath and Cors/Grafts Angiography;  Surgeon: [name] Sine, MD;  Location: [place] CV LAB;  Service: Cardiovascular;  Laterality: N/A;  . CHOLECYSTECTOMY    . CORONARY ARTERY BYPASS GRAFT     X 4 2009 (LIMA to LAD, SVG to first cicumflex marginal branch with sequential SVG to second cicumflex marginal branch  and saphenous vein to posterior descending coronary artery with endoscopic,vein harvest rt. lower exttremity by Dr.Owen on March 31,2009  . CYST REMOVED     FROM SPINE  . CYSTOURETHROSCOPY     right retrograde pyelogram,manipulate stone in the renal pelvis, rt. double-j catheter.  [name] Kitchen EYE SURGERY    . KNEE SURGERY     rt knee  . LEFT HEART CATH AND CORS/GRAFTS ANGIOGRAPHY N/A 02/20/2017   Procedure: LEFT HEART CATH AND CORS/GRAFTS ANGIOGRAPHY;  Surgeon: Leonie Man, MD;  Location: [place] CV LAB;  Service: Cardiovascular;  Laterality: N/A;  . LEFT HEART CATH AND CORS/GRAFTS ANGIOGRAPHY N/A 05/28/2018   Procedure: LEFT HEART CATH AND CORS/GRAFTS ANGIOGRAPHY;  Surgeon: Burnell Blanks, MD;  Location: [place] CV LAB;  Service: Cardiovascular;  Laterality: N/A;  . NEPHROLITHOTOMY     PERCUTANEOUS  . PARS PLANA VITRECTOMY     lt. eye,retinal photocoagulation lt. eye, membrane peel lt. eye.  [name] Kitchen PERCUTANEOUS STENT INTERVENTION Left 05/13/2014   Procedure: PERCUTANEOUS STENT INTERVENTION;  Surgeon: Wellington Hampshire, MD;  Location: [place] CATH LAB;  Service:  Cardiovascular;  Laterality: Left;  COMMON ILLIAC  . re-exploratin of laminectomy  05/2008   (RT L2-3) WITH REDO MICRODISKECTOMY     Current Outpatient Medications  Medication Sig Dispense Refill  . acetaminophen (TYLENOL) 325 MG tablet Take 650 mg by mouth every 6 (six) hours as needed (for pain).     [name] Kitchen amLODipine (NORVASC) 10 MG tablet Take 1 tablet (10 mg total) by mouth daily. 30 tablet 1  . aspirin EC 81 MG tablet Take 81 mg by mouth daily.    [name] Kitchen atorvastatin (LIPITOR) 20 MG tablet Take 1 tablet (20 mg total) by mouth at bedtime. 30 tablet 1  . BD PEN NEEDLE NANO 2ND GEN 32G X 4 MM MISC See admin instructions.    . bethanechol (URECHOLINE) 25 MG tablet Take 25 mg by mouth 2 (two) times daily.    . bisacodyl (DULCOLAX) 5 MG EC tablet Take 5 mg by mouth every 3 (three) days.     . diphenhydrAMINE (BENADRYL) 50 MG tablet Take one  50 mg tablet one hour before the test with the last dose of the prednisone. 1 tablet 0  . docusate sodium (COLACE) 100 MG capsule Take 1 capsule (100 mg total) by mouth 2 (two) times daily. 10 capsule 0  . esomeprazole (NEXIUM) 40 MG capsule Take 30- 60 min before your first and last meals of the day 60 capsule 2  . ezetimibe (ZETIA) 10 MG tablet Take 1 tablet (10 mg total) by mouth daily. 90 tablet 3  . gabapentin (NEURONTIN) 300 MG capsule Take 300 mg by mouth 3 (three) times daily.    [name] Kitchen glimepiride (AMARYL) 2 MG tablet Take 4 mg by mouth daily.    [name] Kitchen glipiZIDE (GLUCOTROL) 5 MG tablet Take 0.5 tablets (2.5 mg total) by mouth 2 (two) times daily before a meal. 60 tablet 0  . Insulin Glargine (LANTUS) 100 UNIT/ML Solostar Pen Inject 18 Units into the skin at bedtime. 15 mL 11  . isosorbide mononitrate (IMDUR) 30 MG 24 hr tablet Take 0.5 tablets (15 mg total) by mouth daily. 45 tablet 3  . lisinopril-hydrochlorothiazide (ZESTORETIC) 20-12.5 MG tablet Take 1 tablet by mouth every morning.    . Melatonin 3 MG TABS Take 1 tablet (3 mg total) by mouth at bedtime. 30  tablet 0  . Multiple Vitamin (MULTIVITAMIN WITH MINERALS) TABS tablet Take 1 tablet by mouth daily.    . nitroGLYCERIN (NITROSTAT) 0.4 MG SL tablet PLACE ONE TABLET UNDER THE TONGUE EVERY 5 MINUTES AS NEEDED FOR CHEST PAIN 25 tablet 6  . ONETOUCH VERIO test strip 2 (two) times daily.    . pantoprazole (PROTONIX) 40 MG tablet Take 40 mg by mouth 2 (two) times daily.    [name] Kitchen senna (SENOKOT) 8.6 MG TABS tablet Take 1 tablet (8.6 mg total) by mouth at bedtime as needed for mild constipation. 120 each 0  . sucralfate (CARAFATE) 1 g tablet Take 1 g by mouth 2 (two) times daily.    . tamsulosin (FLOMAX) 0.4 MG CAPS capsule Take 1 capsule (0.4 mg total) by mouth daily after supper. 30 capsule 0   No current facility-administered medications for this visit.    Allergies:   Iodinated diagnostic agents, Oxycodone hcl, Ranexa [ranolazine], Rosuvastatin calcium, Rosuvastatin calcium, Tramadol, Iohexol, and Statins    Social History:  The patient  reports that he has never smoked. He has never used smokeless tobacco. He reports that he does not drink alcohol and does not use drugs.   Family History:  The patient's family history includes Coronary artery disease in his brother and sister; Diabetes in his brother; Heart attack in his father; Other in his mother and another family member.    ROS:  Please see the history of present illness.   Otherwise, review of systems are positive for none.   All other systems are reviewed and negative.    PHYSICAL EXAM: VS:  BP (!) 112/48 (BP Location: Left Arm, Patient Position: Sitting, Cuff Size: Normal)   Ht 5\' 7"  (1.702 m)   Wt 153 lb 4 oz (69.5 kg)   SpO2 96%   BMI 24.00 kg/m  , BMI Body mass index is 24 kg/m. GEN: Well nourished, well developed, in no acute distress  HEENT: normal  Neck: no JVD, or masses. Bilateral carotid bruit.  Cardiac: RRR; no  rubs, or gallops,no edema . 2/6 SEM at the base.  Respiratory:  clear to auscultation bilaterally, normal work  of breathing GI: soft, nontender, nondistended, + BS MS: no deformity or atrophy  Skin: warm and dry, no rash Neuro:  Strength and sensation are intact Psych: euthymic mood, full affect Vascular: Femoral pulse: +1 bilaterally.  EKG:  EKG is  ordered today. EKG showed normal sinus rhythm left axis deviation possible old inferior infarct.  Recent Labs: 01/27/2020: ALT 36; BUN 15; Creatinine, Ser 1.02; Hemoglobin 13.4; Platelets 256; Potassium 3.5; Sodium 136    Lipid Panel    Component Value Date/Time   CHOL 210 (H) 09/08/2019 1506   TRIG 93 09/08/2019 1506   HDL 48 09/08/2019 1506   CHOLHDL 4.4 09/08/2019 1506   CHOLHDL 3.4 10/19/2018 0227   VLDL 15 10/19/2018 0227   LDLCALC 145 (H) 09/08/2019 1506      Wt Readings from Last 3 Encounters:  02/04/20 153 lb 4 oz (69.5 kg)  01/27/20 158 lb 11.7 oz (72 kg)  10/07/19 155 lb 9.6 oz (70.6 kg)         ASSESSMENT AND PLAN:  1.  Peripheral arterial disease: Status post left iliac artery stenting. He is known to have bilateral SFA occlusion.  Mild right leg claudication seems to be stable.  2. Coronary artery disease involving native coronary arteries with other forms of angina: Cardiac catheterization in April 2020 was reviewed again.  It showed severe native coronary artery disease with patent grafts.  He does have branching disease but no targets for revascularization.  3. Hyperlipidemia: Continue treatment with atorvastatin with a target LDL of less than 70.  4. Essential hypertension: Blood pressure is reasonably controlled.  5. Bilateral carotid disease: Carotid Doppler in October showed mild nonobstructive disease.  6.  Postprandial abdominal pain: This seems to be the biggest issue for him at the present time.  Duplex was suggestive of significant mesenteric artery stenosis.  However, angiography at [facility] in April showed no obstructive disease.  I asked the wife to request the angiogram images on a CD so I can  personally review. There is a possibility of neuropathic mesenteric pain related to his previous abdominal pancreatic surgery.   Disposition:   FU with me in 6 months  Signed, Kathlyn Sacramento, MD  02/04/2020 2:33 PM    [place]

## 2020-02-04 NOTE — Patient Instructions (Signed)
Medication Instructions:  Your physician recommends that you continue on your current medications as directed. Please refer to the Current Medication list given to you today.  *If you need a refill on your cardiac medications before your next appointment, please call your pharmacy*   Lab Work: None ordered If you have labs (blood work) drawn today and your tests are completely normal, you will receive your results only by:  Ricardo Le (if you have MyChart) OR  A paper copy in the mail If you have any lab test that is abnormal or we need to change your treatment, we will call you to review the results.   Testing/Procedures: None ordered   Follow-Up: At [facility], you and your health needs are our priority.  As part of our continuing mission to provide you with exceptional heart care, we have created designated Provider Care Teams.  These Care Teams include your primary Cardiologist (physician) and Advanced Practice Providers (APPs -  Physician Assistants and Nurse Practitioners) who all work together to provide you with the care you need, when you need it.  We recommend signing up for the patient portal called "MyChart".  Sign up information is provided on this After Visit Summary.  MyChart is used to connect with patients for Virtual Visits (Telemedicine).  Patients are able to view lab/test results, encounter notes, upcoming appointments, etc.  Non-urgent messages can be sent to your provider as well.   To learn more about what you can do with MyChart, go to [email or web address].    Your next appointment:   6 month(s)  The format for your next appointment:   In Person  Provider:   You may see Dr. [name] or one of the following Advanced Practice Providers on your designated Care Team:    Murray Hodgkins, NP  Christell Faith, PA-C  Marrianne Mood, PA-C  Cadence Kathlen Mody, [place]  Laurann Montana, NP    Other Instructions Dr. [name] would like for you to obtain a copy  (images) on a disc of the mesenteric angiogram performed in April 2021 at [facility]. Please have it forwarded to our office for him to review.

## 2020-02-05 ENCOUNTER — Telehealth: Payer: Self-pay | Admitting: Cardiovascular Disease

## 2020-02-05 NOTE — Telephone Encounter (Signed)
Per wife [facility] requiring request from office for Imaging cd   Faxing request for April 2021 Mesenteric Angiogram on imaging cd to be sent to [employer]   Phone : [phone] Fax: [phone]  Confirmed Fax

## 2020-02-10 ENCOUNTER — Telehealth: Payer: Self-pay | Admitting: Cardiovascular Disease

## 2020-02-10 MED ORDER — NITROGLYCERIN 0.4 MG SL SUBL: 0.4000 mg | SUBLINGUAL_TABLET | SUBLINGUAL | 3 refills | Status: DC | PRN

## 2020-02-10 NOTE — Telephone Encounter (Signed)
*  STAT* If patient is at the pharmacy, call can be transferred to refill team.   1. Which medications need to be refilled? (please list name of each medication and dose if known)  nitroGLYCERIN (NITROSTAT) 0.4 MG SL tablet  2. Which pharmacy/location (including street and city if local pharmacy) is medication to be sent to? [facility] [phone] - [place], [place] - [address]  3. Do they need a 30 day or 90 day supply? 30  Patient has been using the nitro more often. He may not have enough to last until his appt with DR. Nishan 03/01/20

## 2020-02-12 NOTE — Telephone Encounter (Signed)
Patients wife stated she spoke to [facility] and they scanned in the results and test in to care everywhere under 05/21/19

## 2020-02-13 DIAGNOSIS — R0789 Other chest pain: Secondary | ICD-10-CM | POA: Diagnosis not present

## 2020-02-13 DIAGNOSIS — R0902 Hypoxemia: Secondary | ICD-10-CM | POA: Diagnosis not present

## 2020-02-13 DIAGNOSIS — Z20822 Contact with and (suspected) exposure to covid-19: Secondary | ICD-10-CM | POA: Diagnosis not present

## 2020-02-13 DIAGNOSIS — Z794 Long term (current) use of insulin: Secondary | ICD-10-CM | POA: Diagnosis not present

## 2020-02-13 DIAGNOSIS — J9811 Atelectasis: Secondary | ICD-10-CM | POA: Diagnosis not present

## 2020-02-13 DIAGNOSIS — I10 Essential (primary) hypertension: Secondary | ICD-10-CM | POA: Diagnosis not present

## 2020-02-13 DIAGNOSIS — Z79899 Other long term (current) drug therapy: Secondary | ICD-10-CM | POA: Diagnosis not present

## 2020-02-13 DIAGNOSIS — E785 Hyperlipidemia, unspecified: Secondary | ICD-10-CM | POA: Diagnosis not present

## 2020-02-13 DIAGNOSIS — E119 Type 2 diabetes mellitus without complications: Secondary | ICD-10-CM | POA: Diagnosis not present

## 2020-02-13 DIAGNOSIS — R079 Chest pain, unspecified: Secondary | ICD-10-CM | POA: Diagnosis not present

## 2020-02-13 DIAGNOSIS — R0600 Dyspnea, unspecified: Secondary | ICD-10-CM | POA: Diagnosis not present

## 2020-02-13 DIAGNOSIS — R0602 Shortness of breath: Secondary | ICD-10-CM | POA: Diagnosis not present

## 2020-02-13 DIAGNOSIS — Z7982 Long term (current) use of aspirin: Secondary | ICD-10-CM | POA: Diagnosis not present

## 2020-02-13 DIAGNOSIS — Z87891 Personal history of nicotine dependence: Secondary | ICD-10-CM | POA: Diagnosis not present

## 2020-02-13 DIAGNOSIS — J80 Acute respiratory distress syndrome: Secondary | ICD-10-CM | POA: Diagnosis not present

## 2020-02-13 DIAGNOSIS — Z8679 Personal history of other diseases of the circulatory system: Secondary | ICD-10-CM | POA: Insufficient documentation

## 2020-02-13 DIAGNOSIS — I700 Atherosclerosis of aorta: Secondary | ICD-10-CM | POA: Diagnosis not present

## 2020-02-17 NOTE — Telephone Encounter (Signed)
Spoke with Judeth Cornfield at [facility]. [phone] (to rqst radiology film). Judeth Cornfield sts that the images were shared on [employer] on 02/05/20.  Judeth Cornfield sts that we are to contact them again if they are not viewable so they can be resent.

## 2020-02-18 NOTE — Telephone Encounter (Signed)
Faxed request ( fax cover ) to [facility]ology records [phone].

## 2020-02-18 NOTE — Telephone Encounter (Signed)
Called [facility] again and spoke with [place]. (Radiology medical records) Adv her of Dr. Jari Sportsman response. She ask if he has access to the 'pax system" if not they will need a ROI faxed to [phone]. Judeth Cornfield sts that the confirmed fax sent by Maurie Boettcher sent to their main medical records and that is not the correct dept.

## 2020-02-18 NOTE — Telephone Encounter (Signed)
I am not familiar with [employer] at all and not entirely sure that I can review it given that we have a different EMR.  I tried to look for a link and could not find it.  Can they give [place] instructions how to review them and if they are not reviewable by [place] can they put the study on a CD? Thanks

## 2020-02-24 NOTE — Progress Notes (Incomplete)
Cardiology Office Note   Date:  02/24/2020   ID:  Ricardo Le, DOB 08-01-1940, MRN [id number]  PCP:  Angelina Sheriff, MD  Cardiologist:  Dr. Glo Herring for PV   No chief complaint on file.     History of Present Illness:  80 y.o. seen by Dr [name] mostly for PVD issues. History of CAD with CABG in 2009 last cath 05/28/18 with patent SVG to PDA, patent SVG distal circumflex patent LIMA to LAD with no targets for PCI.  TTE done 12/23/18 showed normal EF 60-65% no significant valve disease  He has had left iliac stent with bilateral SFA occlusions and single vessel run off Most recent noninvasive vascular studies in October, 2020 showed an ABI of 0.93 on the right and noncompressible vessel on the left.  Carotids done 12/08/19 with plaque no stenosis    He was hospitalized in June, 2020 with small subarachnoid hemorrhage which did not require intervention.  He had multiple falls last year.  He was diagnosed with pulmonary embolism and right lower extremity DVT in September of 2020 and was placed on anticoagulation after being cleared by neurosurgery.  He took anticoagulation for few months and then it was discontinued.  He may have mesenteric ischemia ant has been seen by [facility] vascular surgery as well as Dr [name]. He was seen by vascular surgery at [facility] for possible chronic mesenteric ischemia.   He did undergo mesenteric angiogram via a left brachial artery cutdown in April of this year.  This showed no significant stenosis affecting the celiac artery or superior mesenteric artery. He continues to have abdominal pain that is not always post prandial   ***  Past Medical History:  Diagnosis Date  . CAD (coronary artery disease)    a. 04/2007 s/p CABG x 4  - LIMA->LAD, VG->OM1->Om2, VG->PDA;  b. 12/2008 & 06/2010 Caths - Native multivessel dzs with 4/4 patent grafts. c. cath 03/09/2015 patent grafts, unchanged from prior cath.  . Cancer [facility])    pancreatic  March 2011  .  Carotid bruit    a. 07/2010 U/S- 0-39% bilat ICA stenosis  . Chest pain    Noncardiac probably related to reflux  . DDD (degenerative disc disease)    several surgeries  . Fracture acetabulum-closed ([place]) 07/08/2018  . GERD (gastroesophageal reflux disease)   . HNP (herniated nucleus pulposus), lumbar    a. L2-3, s/p laminotomy, microdiskectomy 02/2009  . HOH (hard of hearing)    uses amplifiers   . Nephrolithiasis   . PAD (peripheral artery disease) ([place]) 04/2014   s/p L Common Iliac Stent  . Palpitations   . 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 Pneumonia    hx of  pna  . Pure hypercholesterolemia    statin intolerance  . Type II or unspecified type diabetes mellitus without mention of complication, not stated as uncontrolled    insulin dependent  . Unspecified essential hypertension     Past Surgical History:  Procedure Laterality Date  . ABDOMINAL AORTAGRAM N/A 05/13/2014   Procedure: ABDOMINAL [name];  Surgeon: Wellington Hampshire, MD;  Location: [place] CATH LAB;  Service: Cardiovascular;  Laterality: N/A;  . ANTERIOR CERVICAL DECOMP/DISCECTOMY FUSION N/A 07/28/2014   Procedure: ANTERIOR CERVICAL DECOMPRESSION/DISCECTOMY FUSION CERVICAL FOUR-FIVE CERVICAL FIVE-SIX ;  Surgeon: Earnie Larsson, MD;  Location: [place] NEURO ORS;  Service: Neurosurgery;  Laterality: N/A;  . APPENDECTOMY    . arthroscopy  right knee    . BACK SURGERY    . CARDIAC CATHETERIZATION N/A 03/09/2015   Procedure: Left Heart Cath and Cors/Grafts Angiography;  Surgeon: [name] Sine, MD;  Location: [place] CV LAB;  Service: Cardiovascular;  Laterality: N/A;  . CHOLECYSTECTOMY    . CORONARY ARTERY BYPASS GRAFT     X 4 2009 (LIMA to LAD, SVG to first cicumflex marginal branch with sequential SVG to second cicumflex marginal branch  and saphenous vein to posterior descending coronary artery with endoscopic,vein harvest rt. lower exttremity by Dr.Owen on March 31,2009  . CYST  REMOVED     FROM SPINE  . CYSTOURETHROSCOPY     right retrograde pyelogram,manipulate stone in the renal pelvis, rt. double-j catheter.  [name] Kitchen EYE SURGERY    . KNEE SURGERY     rt knee  . LEFT HEART CATH AND CORS/GRAFTS ANGIOGRAPHY N/A 02/20/2017   Procedure: LEFT HEART CATH AND CORS/GRAFTS ANGIOGRAPHY;  Surgeon: Leonie Man, MD;  Location: [place] CV LAB;  Service: Cardiovascular;  Laterality: N/A;  . LEFT HEART CATH AND CORS/GRAFTS ANGIOGRAPHY N/A 05/28/2018   Procedure: LEFT HEART CATH AND CORS/GRAFTS ANGIOGRAPHY;  Surgeon: Burnell Blanks, MD;  Location: [place] CV LAB;  Service: Cardiovascular;  Laterality: N/A;  . NEPHROLITHOTOMY     PERCUTANEOUS  . PARS PLANA VITRECTOMY     lt. eye,retinal photocoagulation lt. eye, membrane peel lt. eye.  [name] Kitchen PERCUTANEOUS STENT INTERVENTION Left 05/13/2014   Procedure: PERCUTANEOUS STENT INTERVENTION;  Surgeon: Wellington Hampshire, MD;  Location: [place] CATH LAB;  Service: Cardiovascular;  Laterality: Left;  COMMON ILLIAC  . re-exploratin of laminectomy  05/2008   (RT L2-3) WITH REDO MICRODISKECTOMY     Current Outpatient Medications  Medication Sig Dispense Refill  . acetaminophen (TYLENOL) 325 MG tablet Take 650 mg by mouth every 6 (six) hours as needed (for pain).     [name] Kitchen amLODipine (NORVASC) 10 MG tablet Take 1 tablet (10 mg total) by mouth daily. 30 tablet 1  . aspirin EC 81 MG tablet Take 81 mg by mouth daily.    [name] Kitchen atorvastatin (LIPITOR) 20 MG tablet Take 1 tablet (20 mg total) by mouth at bedtime. 30 tablet 1  . BD PEN NEEDLE NANO 2ND GEN 32G X 4 MM MISC See admin instructions.    . bethanechol (URECHOLINE) 25 MG tablet Take 25 mg by mouth 2 (two) times daily.    . bisacodyl (DULCOLAX) 5 MG EC tablet Take 5 mg by mouth every 3 (three) days.     . diphenhydrAMINE (BENADRYL) 50 MG tablet Take one 50 mg tablet one hour before the test with the last dose of the prednisone. 1 tablet 0  . docusate sodium (COLACE) 100 MG capsule Take 1 capsule  (100 mg total) by mouth 2 (two) times daily. 10 capsule 0  . esomeprazole (NEXIUM) 40 MG capsule Take 30- 60 min before your first and last meals of the day 60 capsule 2  . ezetimibe (ZETIA) 10 MG tablet Take 1 tablet (10 mg total) by mouth daily. 90 tablet 3  . gabapentin (NEURONTIN) 300 MG capsule Take 300 mg by mouth 3 (three) times daily.    [name] Kitchen glimepiride (AMARYL) 2 MG tablet Take 4 mg by mouth daily.    [name] Kitchen glipiZIDE (GLUCOTROL) 5 MG tablet Take 0.5 tablets (2.5 mg total) by mouth 2 (two) times daily before a meal. 60 tablet 0  . Insulin Glargine (LANTUS) 100 UNIT/ML Solostar Pen Inject 18 Units into the  skin at bedtime. 15 mL 11  . isosorbide mononitrate (IMDUR) 30 MG 24 hr tablet Take 0.5 tablets (15 mg total) by mouth daily. 45 tablet 3  . lisinopril-hydrochlorothiazide (ZESTORETIC) 20-12.5 MG tablet Take 1 tablet by mouth every morning.    . Melatonin 3 MG TABS Take 1 tablet (3 mg total) by mouth at bedtime. 30 tablet 0  . Multiple Vitamin (MULTIVITAMIN WITH MINERALS) TABS tablet Take 1 tablet by mouth daily.    . nitroGLYCERIN (NITROSTAT) 0.4 MG SL tablet Place 1 tablet (0.4 mg total) under the tongue every 5 (five) minutes as needed for chest pain. 25 tablet 3  . ONETOUCH VERIO test strip 2 (two) times daily.    [name] Kitchen senna (SENOKOT) 8.6 MG TABS tablet Take 1 tablet (8.6 mg total) by mouth at bedtime as needed for mild constipation. 120 each 0  . sucralfate (CARAFATE) 1 g tablet Take 1 g by mouth 2 (two) times daily.    . tamsulosin (FLOMAX) 0.4 MG CAPS capsule Take 1 capsule (0.4 mg total) by mouth daily after supper. 30 capsule 0   No current facility-administered medications for this visit.    Allergies:   Iodinated diagnostic agents, Oxycodone hcl, Ranexa [ranolazine], Rosuvastatin calcium, Rosuvastatin calcium, Tramadol, Iohexol, and Statins    Social History:  The patient  reports that he has never smoked. He has never used smokeless tobacco. He reports that he does not drink  alcohol and does not use drugs.   Family History:  The patient's family history includes Coronary artery disease in his brother and sister; Diabetes in his brother; Heart attack in his father; Other in his mother and another family member.    ROS:  Please see the history of present illness.   Otherwise, review of systems are positive for none.   All other systems are reviewed and negative.    PHYSICAL EXAM: VS:  There were no vitals taken for this visit. , BMI  Affect appropriate Healthy:  appears stated age 81: normal Neck supple with no adenopathy JVP normal no bruits no thyromegaly Lungs clear with no wheezing and good diaphragmatic motion Heart:  S1/S2 no murmur, no rub, gallop or click PMI normal post sternotomy  Abdomen: benighn, BS positve, no tenderness, no AAA no bruit.  No HSM or HJR Bilateral femoral bruits  No edema Neuro non-focal Skin warm and dry No muscular weakness   EKG:   EKG showed normal sinus rhythm left axis deviation possible old inferior infarct.  Recent Labs: 01/27/2020: ALT 36; BUN 15; Creatinine, Ser 1.02; Hemoglobin 13.4; Platelets 256; Potassium 3.5; Sodium 136    Lipid Panel    Component Value Date/Time   CHOL 210 (H) 09/08/2019 1506   TRIG 93 09/08/2019 1506   HDL 48 09/08/2019 1506   CHOLHDL 4.4 09/08/2019 1506   CHOLHDL 3.4 10/19/2018 0227   VLDL 15 10/19/2018 0227   LDLCALC 145 (H) 09/08/2019 1506      Wt Readings from Last 3 Encounters:  02/04/20 69.5 kg  01/27/20 72 kg  10/07/19 70.6 kg         ASSESSMENT AND PLAN:  1.  Peripheral arterial disease: Status post left iliac artery stenting. He is known to have bilateral SFA occlusion.  Mild right leg claudication seems to be stable.  2. Coronary artery disease involving native coronary arteries with other forms of angina: Cardiac catheterization in April 2020 was reviewed again.  It showed severe native coronary artery disease with patent grafts.  He does  have branching  disease but no targets for revascularization.  3. Hyperlipidemia: Continue treatment with atorvastatin with a target LDL of less than 70.  4. Essential hypertension: Blood pressure is reasonably controlled.  5. Bilateral carotid disease: Carotid Doppler in October showed mild nonobstructive disease.  6.  Postprandial abdominal pain:  There is a possibility of neuropathic mesenteric pain related to his previous abdominal pancreatic surgery.Seems to have contradictory findings on doppler vs invasive angiography To f/u with Dr [name]    Disposition:   FU with me in 6 months  Signed, Jenkins Rouge, MD  02/24/2020 7:35 PM    [place]

## 2020-02-25 DIAGNOSIS — K5901 Slow transit constipation: Secondary | ICD-10-CM | POA: Diagnosis not present

## 2020-02-26 NOTE — Telephone Encounter (Signed)
CD received and placed on Dr. Tyrell Antonio desk for review.

## 2020-03-01 ENCOUNTER — Ambulatory Visit: Payer: Medicare Other | Admitting: Cardiovascular Disease

## 2020-03-09 ENCOUNTER — Telehealth: Payer: Self-pay

## 2020-03-09 NOTE — Telephone Encounter (Signed)
-----   Message from Wellington Hampshire, MD sent at 03/08/2020  2:41 PM EST ----- Please let him know that I reviewed the mesenteric angiogram angiogram which was performed at [facility] and agree that there were no significant blockages affecting the mesenteric arteries.

## 2020-03-09 NOTE — Telephone Encounter (Signed)
DPR on file. Spoke with the patients wife and made her aware of Dr. Tyrell Antonio message below. Pt wife verbalized understanding and voiced appreciation to Dr. [name] for the assistance.

## 2020-03-12 NOTE — Progress Notes (Signed)
Cardiology Office Note   Date:  03/19/2020   ID:  Ricardo Le, DOB 11-Aug-1940, MRN [id number]  PCP:  Ricardo Sheriff, MD  Cardiologist:  Ricardo. Glo Le for PV   No chief complaint on file.     History of Present Illness:  80 y.o. seen by Ricardo Le mostly for PVD issues. History of CAD with CABG in 2009 last cath 05/28/18 with patent SVG to PDA, patent SVG distal circumflex patent LIMA to LAD with no targets for PCI.  TTE done 12/23/18 showed normal EF 60-65% no significant valve disease  He has had left iliac stent with bilateral SFA occlusions and single vessel run off Most recent noninvasive vascular studies in October, 2020 showed an ABI of 0.93 on the right and noncompressible vessel on the left.  Carotids done 12/08/19 with plaque no stenosis    He was hospitalized in June, 2020 with small subarachnoid hemorrhage which did not require intervention.  He had multiple falls last year.  He was diagnosed with pulmonary embolism and right lower extremity DVT in September of 2020 and was placed on anticoagulation after being cleared by neurosurgery.  He took anticoagulation for few months and then it was discontinued.  He may have mesenteric ischemia ant has been seen by Ricardo Le vascular surgery as well as Ricardo Le. He was seen by vascular surgery at [facility] for possible chronic mesenteric ischemia.   He did undergo mesenteric angiogram via a left brachial artery cutdown in April of this year.  This showed no significant stenosis affecting the celiac artery or superior mesenteric artery. He continues to have abdominal pain that is not always post prandial   He has a bad back with lots of pain Working wit Ricardo Le but not clear he will need surgery   Past Medical History:  Diagnosis Date  . CAD (coronary artery disease)    a. 04/2007 s/p CABG x 4  - LIMA->LAD, VG->OM1->Om2, VG->PDA;  b. 12/2008 & 06/2010 Caths - Native multivessel dzs with 4/4 patent grafts. c. cath 03/09/2015  patent grafts, unchanged from prior cath.  . Cancer [facility])    pancreatic  March 2011  . Carotid bruit    a. 07/2010 U/S- 0-39% bilat ICA stenosis  . Chest pain    Noncardiac probably related to reflux  . DDD (degenerative disc disease)    several surgeries  . Fracture acetabulum-closed ([place]) 07/08/2018  . GERD (gastroesophageal reflux disease)   . HNP (herniated nucleus pulposus), lumbar    a. L2-3, s/p laminotomy, microdiskectomy 02/2009  . HOH (hard of hearing)    uses amplifiers   . Nephrolithiasis   . PAD (peripheral artery disease) ([place]) 04/2014   s/p L Common Iliac Stent  . Palpitations   . 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 Pneumonia    hx of  pna  . Pure hypercholesterolemia    statin intolerance  . Type II or unspecified type diabetes mellitus without mention of complication, not stated as uncontrolled    insulin dependent  . Unspecified essential hypertension     Past Surgical History:  Procedure Laterality Date  . ABDOMINAL AORTAGRAM N/A 05/13/2014   Procedure: ABDOMINAL Ricardo Le;  Surgeon: Ricardo Hampshire, MD;  Location: [facility];  Service: Cardiovascular;  Laterality: N/A;  . ANTERIOR CERVICAL DECOMP/DISCECTOMY FUSION N/A 07/28/2014   Procedure: ANTERIOR CERVICAL DECOMPRESSION/DISCECTOMY FUSION CERVICAL FOUR-FIVE CERVICAL FIVE-SIX ;  Surgeon: Ricardo Larsson,  MD;  Location: [facility];  Service: Neurosurgery;  Laterality: N/A;  . APPENDECTOMY    . arthroscopy right knee    . BACK SURGERY    . CARDIAC CATHETERIZATION N/A 03/09/2015   Procedure: Left Heart Cath and Cors/Grafts Angiography;  Surgeon: Ricardo Sine, MD;  Location: [place] [facility];  Service: Cardiovascular;  Laterality: N/A;  . CHOLECYSTECTOMY    . CORONARY ARTERY BYPASS GRAFT     X 4 2009 (LIMA to LAD, SVG to first cicumflex marginal branch with sequential SVG to second cicumflex marginal branch  and saphenous vein to posterior descending coronary  artery with endoscopic,vein harvest rt. lower exttremity by Ricardo Le on March 31,2009  . CYST REMOVED     FROM SPINE  . CYSTOURETHROSCOPY     right retrograde pyelogram,manipulate stone in the renal pelvis, rt. double-j catheter.  [name] Kitchen EYE SURGERY    . KNEE SURGERY     rt knee  . LEFT HEART CATH AND CORS/GRAFTS ANGIOGRAPHY N/A 02/20/2017   Procedure: LEFT HEART CATH AND CORS/GRAFTS ANGIOGRAPHY;  Surgeon: Ricardo Man, MD;  Location: [facility];  Service: Cardiovascular;  Laterality: N/A;  . LEFT HEART CATH AND CORS/GRAFTS ANGIOGRAPHY N/A 05/28/2018   Procedure: LEFT HEART CATH AND CORS/GRAFTS ANGIOGRAPHY;  Surgeon: Ricardo Blanks, MD;  Location: [facility];  Service: Cardiovascular;  Laterality: N/A;  . NEPHROLITHOTOMY     PERCUTANEOUS  . PARS PLANA VITRECTOMY     lt. eye,retinal photocoagulation lt. eye, membrane peel lt. eye.  [name] Kitchen PERCUTANEOUS STENT INTERVENTION Left 05/13/2014   Procedure: PERCUTANEOUS STENT INTERVENTION;  Surgeon: Ricardo Hampshire, MD;  Location: [facility];  Service: Cardiovascular;  Laterality: Left;  COMMON ILLIAC  . re-exploratin of laminectomy  05/2008   (RT L2-3) WITH REDO MICRODISKECTOMY     Current Outpatient Medications  Medication Sig Dispense Refill  . acetaminophen (TYLENOL) 325 MG tablet Take 650 mg by mouth every 6 (six) hours as needed (for pain).     [name] Kitchen amLODipine (NORVASC) 10 MG tablet Take 1 tablet (10 mg total) by mouth daily. 30 tablet 1  . aspirin EC 81 MG tablet Take 81 mg by mouth daily.    [name] Kitchen atorvastatin (LIPITOR) 20 MG tablet Take 1 tablet (20 mg total) by mouth at bedtime. 30 tablet 1  . BD PEN NEEDLE NANO 2ND GEN 32G X 4 MM MISC See admin instructions.    . bethanechol (URECHOLINE) 25 MG tablet Take 25 mg by mouth 2 (two) times daily.    . bisacodyl (DULCOLAX) 5 MG EC tablet Take 5 mg by mouth every 3 (three) days.     . diphenhydrAMINE (BENADRYL) 50 MG tablet Take one 50 mg tablet one hour before the test with the last  dose of the prednisone. 1 tablet 0  . docusate sodium (COLACE) 100 MG capsule Take 1 capsule (100 mg total) by mouth 2 (two) times daily. 10 capsule 0  . ezetimibe (ZETIA) 10 MG tablet Take 1 tablet (10 mg total) by mouth daily. 90 tablet 3  . gabapentin (NEURONTIN) 300 MG capsule Take 300 mg by mouth 3 (three) times daily.    [name] Kitchen glimepiride (AMARYL) 2 MG tablet Take 4 mg by mouth daily.    [name] Kitchen glipiZIDE (GLUCOTROL) 5 MG tablet Take 0.5 tablets (2.5 mg total) by mouth 2 (two) times daily before a meal. 60 tablet 0  . Insulin Glargine (LANTUS) 100 UNIT/ML Solostar Pen Inject 18 Units into the skin at bedtime.  15 mL 11  . lisinopril-hydrochlorothiazide (ZESTORETIC) 20-12.5 MG tablet Take 1 tablet by mouth every morning.    . Melatonin 3 MG TABS Take 1 tablet (3 mg total) by mouth at bedtime. 30 tablet 0  . Multiple Vitamin (MULTIVITAMIN WITH MINERALS) TABS tablet Take 1 tablet by mouth daily.    . nitroGLYCERIN (NITROSTAT) 0.4 MG SL tablet Place 1 tablet (0.4 mg total) under the tongue every 5 (five) minutes as needed for chest pain. 25 tablet 3  . ONETOUCH VERIO test strip 2 (two) times daily.    . sucralfate (CARAFATE) 1 g tablet Take 1 g by mouth 2 (two) times daily.    . tamsulosin (FLOMAX) 0.4 MG CAPS capsule Take 1 capsule (0.4 mg total) by mouth daily after supper. 30 capsule 0  . esomeprazole (NEXIUM) 40 MG capsule Take 30- 60 min before your first and last meals of the day (Patient not taking: Reported on 03/19/2020) 60 capsule 2  . isosorbide mononitrate (IMDUR) 30 MG 24 hr tablet Take 0.5 tablets (15 mg total) by mouth daily. 45 tablet 3  . senna (SENOKOT) 8.6 MG TABS tablet Take 1 tablet (8.6 mg total) by mouth at bedtime as needed for mild constipation. (Patient not taking: Reported on 03/19/2020) 120 each 0   No current facility-administered medications for this visit.    Allergies:   Iodinated diagnostic agents, Oxycodone hcl, Ranexa [ranolazine], Rosuvastatin calcium, Rosuvastatin  calcium, Tramadol, Iohexol, and Statins    Social History:  The patient  reports that he has never smoked. He has never used smokeless tobacco. He reports that he does not drink alcohol and does not use drugs.   Family History:  The patient's family history includes Coronary artery disease in his brother and sister; Diabetes in his brother; Heart attack in his father; Other in his mother and another family member.    ROS:  Please see the history of present illness.   Otherwise, review of systems are positive for none.   All other systems are reviewed and negative.    PHYSICAL EXAM: VS:  BP (!) 122/58   Pulse (!) 55   Ht 5\' 7"  (1.702 m)   Wt 71.1 kg   SpO2 96%   BMI 24.56 kg/m  , BMI  Affect appropriate Healthy:  appears stated age 89: normal Neck supple with no adenopathy JVP normal no bruits no thyromegaly Lungs clear with no wheezing and good diaphragmatic motion Heart:  S1/S2 no murmur, no rub, gallop or click PMI normal post sternotomy  Abdomen: benighn, BS positve, no tenderness, no AAA no bruit.  No HSM or HJR Bilateral femoral bruits  No edema Neuro non-focal Skin warm and dry No muscular weakness   EKG:   EKG showed normal sinus rhythm left axis deviation possible old inferior infarct.  Recent Labs: 01/27/2020: ALT 36; BUN 15; Creatinine, Ser 1.02; Hemoglobin 13.4; Platelets 256; Potassium 3.5; Sodium 136    Lipid Panel    Component Value Date/Time   CHOL 210 (H) 09/08/2019 1506   TRIG 93 09/08/2019 1506   HDL 48 09/08/2019 1506   CHOLHDL 4.4 09/08/2019 1506   CHOLHDL 3.4 10/19/2018 0227   VLDL 15 10/19/2018 0227   LDLCALC 145 (H) 09/08/2019 1506      Wt Readings from Last 3 Encounters:  03/19/20 71.1 kg  02/04/20 69.5 kg  01/27/20 72 kg         ASSESSMENT AND PLAN:  1.  Peripheral arterial disease: Status post left iliac artery  stenting. He is known to have bilateral SFA occlusion.  Mild right leg claudication seems to be stable.  2.  Coronary artery disease involving native coronary arteries with other forms of angina: Cardiac catheterization in April 2020 was reviewed again.  It showed severe native coronary artery disease with patent grafts.  He does have branching disease but no targets for revascularization.  3. Hyperlipidemia: Continue treatment with atorvastatin with a target LDL of less than 70.  4. Essential hypertension: Blood pressure is reasonably controlled.  5. Bilateral carotid disease: Carotid Doppler in October showed mild nonobstructive disease.  6.  Postprandial abdominal pain:  There is a possibility of neuropathic mesenteric pain related to his previous abdominal pancreatic surgery.Seems to have contradictory findings on doppler vs invasive angiography To f/u with Ricardo Le    Disposition:   FU with me in 6 months  Signed, Jenkins Rouge, MD  03/19/2020 11:33 AM    [place]

## 2020-03-19 ENCOUNTER — Ambulatory Visit: Payer: Medicare Other | Admitting: Cardiovascular Disease

## 2020-03-19 ENCOUNTER — Encounter: Payer: Self-pay | Admitting: Cardiovascular Disease

## 2020-03-19 ENCOUNTER — Other Ambulatory Visit: Payer: Self-pay

## 2020-03-19 VITALS — BP 122/58 | HR 55 | Ht 67.0 in | Wt 156.8 lb

## 2020-03-19 DIAGNOSIS — I739 Peripheral vascular disease, unspecified: Secondary | ICD-10-CM | POA: Diagnosis not present

## 2020-03-19 DIAGNOSIS — I10 Essential (primary) hypertension: Secondary | ICD-10-CM | POA: Diagnosis not present

## 2020-03-19 DIAGNOSIS — Z951 Presence of aortocoronary bypass graft: Secondary | ICD-10-CM | POA: Diagnosis not present

## 2020-03-19 NOTE — Patient Instructions (Addendum)

## 2020-03-30 DIAGNOSIS — M545 Low back pain, unspecified: Secondary | ICD-10-CM | POA: Diagnosis not present

## 2020-03-30 DIAGNOSIS — K5904 Chronic idiopathic constipation: Secondary | ICD-10-CM | POA: Diagnosis not present

## 2020-04-05 IMAGING — CT CT ANGIO AOBIFEM WO/W CM
1 of 10 series · 3 of 16 positions shown, 4 images · IV contrast (iopamidol)
Comparison: Prior CT abdomen and pelvis 05/09/2018

CLINICAL DATA: 78-year-old male with left greater than right lower
extremity claudication

EXAM:
CT ANGIOGRAPHY OF ABDOMINAL AORTA WITH ILIOFEMORAL RUNOFF
TECHNIQUE: Multidetector CT imaging of the abdomen, pelvis and lower
extremities was performed using the standard protocol during bolus
administration of intravenous contrast. Multiplanar CT image
reconstructions and MIPs were obtained to evaluate the vascular
anatomy.
CONTRAST:  100mL [id number] IOPAMIDOL ([id number]) INJECTION 76%

[Series 5: cta whole body 2.00 bv36 s3 axial 2 mm · axial · 0.71mm/px · z∈[+847,+1531]mm · 3 of 685 slices shown, 4 images]
[im 172/685  soft-tissue]
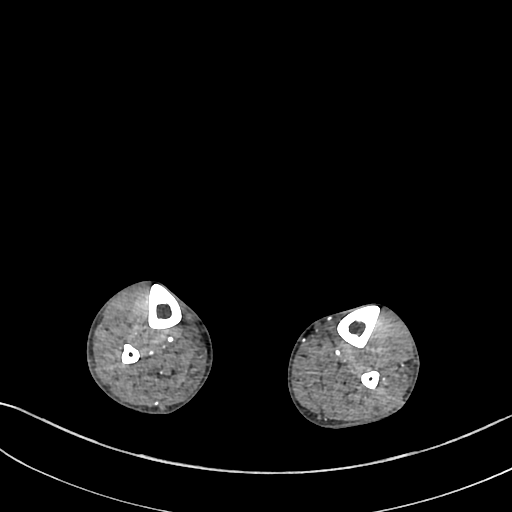
[im 172/685  bone]
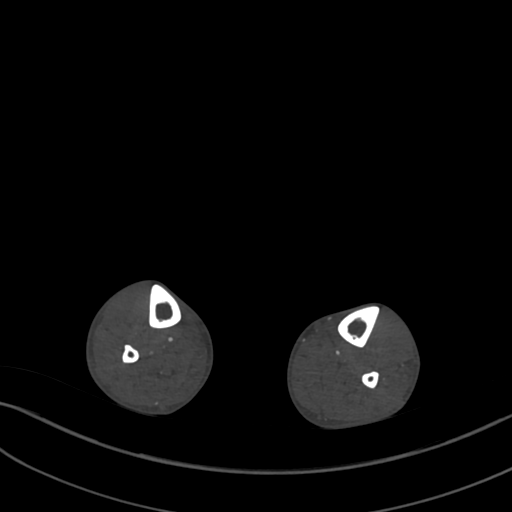
[im 343/685  soft-tissue]
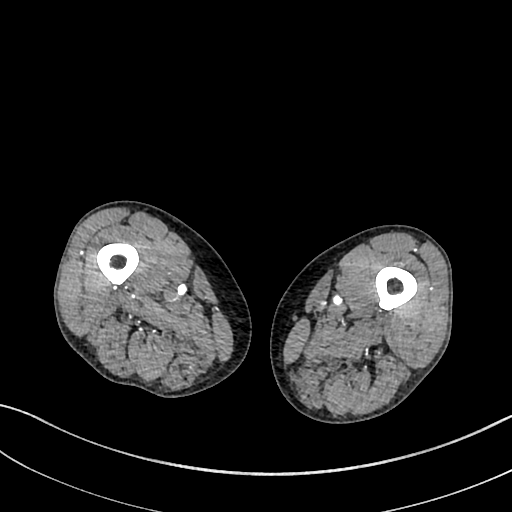
[im 514/685  soft-tissue]
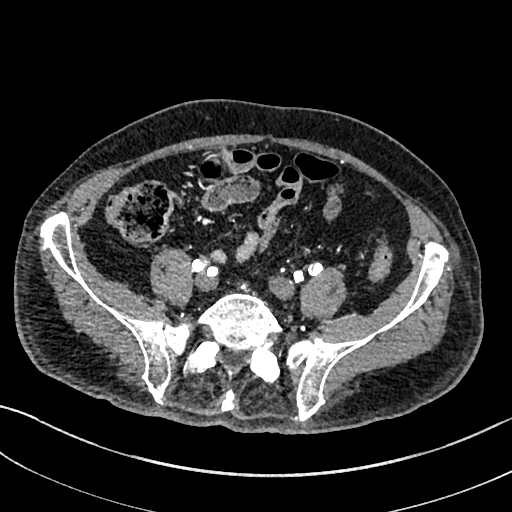

[3 of 16 positions shown; findings below may reference images not displayed]

FINDINGS: VASCULAR

Aorta: Extensive calcified atherosclerotic plaque throughout the
abdominal aorta. No evidence of stenosis, occlusion, aneurysm or
dissection.

Celiac: Mild calcified plaque at the origin without significant
stenosis. The lateral segmental branch of the left hepatic artery is
replaced to the left gastric artery. The splenic artery is ligated.

SMA: Mild atherosclerotic plaque at the origin without significant
stenosis. No aneurysm or dissection.

Renals: There are 2 right-sided renal arteries and a single left
renal artery. Heavily calcified atherosclerotic plaque results in
significant appearing stenosis of the dominant right renal artery.
The accessory right renal artery is small and the origin not well
visualized. On the left, calcified atherosclerotic plaque is present
but there is no significant stenosis.

IMA: Patent without evidence of aneurysm, dissection, vasculitis or
significant stenosis.

RIGHT Lower Extremity

Inflow: Calcified atherosclerotic plaque throughout the common iliac
artery without significant stenosis. Patent common iliac artery
stent. The right internal iliac artery is chronically occluded.
Calcified plaque extends into the external iliac artery however
there is no significant stenosis.

Outflow: Near circumferential calcified atherosclerotic plaque
without significant stenosis. The profunda femoral artery is patent.
Heavily calcified atherosclerotic plaque results in high-grade
stenosis of the proximal superficial femoral artery. A second
moderate stenosis of the SFA is present in the upper thigh. Multiple
areas of mild stenosis than present throughout the mid thigh.
Superficial femoral artery then occludes at the abductor canal
before reconstituting at the P1 segment of the popliteal artery.
Heavily calcified plaque results in significant stenosis versus
occlusion at the P2 segment of the popliteal artery. The artery
reconstitutes at the P3 segment before demonstrating significant
high-grade stenosis bordering on occlusion.

Runoff: The runoff arteries are diffusely diseased. The posterior
tibial artery plug vies single vessel runoff to the ankle.

LEFT Lower Extremity

Inflow: Patent common iliac stent. The internal iliac is chronically
occluded. The external iliac is mildly diseased but there is no
hemodynamically significant stenosis.

Outflow: Minimal disease in the common femoral artery without
significant stenosis. The profunda femoral branches are patent.
Calcified atherosclerotic plaque results in moderate stenosis of the
proximal superficial femoral artery. The superficial femoral artery
is diffusely mildly disease throughout the mid thigh. Multifocal
moderate to high-grade stenoses beginning in the abductor canal
secondary to mixed bulky calcified and fibrofatty atherosclerotic
plaque. Suspect short segment occlusion as the vessel exits the
abductor canal. There is reconstitution in the P1 segment of the
popliteal artery, however this segment is severely diseased and
significantly stenosed. The P2 segment of the popliteal artery
becomes more normal in caliber and remains patent before becoming
severely diseased distally.

Runoff: The trifurcation is occluded at the origin. The posterior
tibial artery then reconstitutes via collateral flow and provides
single-vessel runoff to the ankle.

Veins: No acute venous abnormality. Retroaortic left renal vein
noted incidentally.

Review of the MIP images confirms the above findings.

NON-VASCULAR

Lower chest: The heart is normal in size. Calcifications noted
throughout the coronary arteries no suspicious pulmonary mass or
nodule. Elevation of the right hemidiaphragm with colonic
interposition. No pericardial effusion.

Hepatobiliary: Atypical hepatic contour. There may have been
congenital occlusion of a portion of the right portal vein versus
prior trisegmentectomy surgery. Colonic interposition is noted. The
gallbladder is surgically absent. No intra or extrahepatic biliary
ductal dilatation. No discrete hepatic lesion.

Pancreas: Patient is status post distal pancreatectomy. No mass
lesion or inflammatory changes.

Spleen: Patient is status post splenectomy.

Adrenals/Urinary Tract: Mild adreniform thickening bilaterally
without nodularity. No evidence of hydronephrosis or enhancing renal
mass. Solitary 5 mm nonobstructing stone in the lower pole
collecting system on the right. Focal right lower pole renal
parenchymal scarring. The ureters and bladder are unremarkable.

Stomach/Bowel: No focal bowel wall thickening or evidence of
obstruction.

Lymphatic: No suspicious lymphadenopathy.

Reproductive: Marked prostatomegaly.

Other: No ascites or abdominal wall hernia.

Musculoskeletal: No acute fracture or aggressive appearing lytic or
blastic osseous lesion. Surgical changes of prior lumbar spine
surgery with multilevel laminectomy. Extensive multilevel
degenerative disc disease and facet arthropathy.
IMPRESSION: VASCULAR

1. Severe multilevel bilateral peripheral arterial disease
predominantly involving the femoropopliteal and runoff segments.
2. Patent bilateral common iliac stents.
3. Chronically occluded bilateral internal iliac arteries.
4. No significant stenosis in either external iliac artery or common
femoral artery.
5. Chronic occlusion of the right superficial femoral artery in the
region of the abductor canal.
6. High-grade stenosis versus focal occlusion of the P2 segment of
the right popliteal artery.
7. Occluded trifurcation with reconstitution of the right posterior
tibial artery which provides single-vessel runoff to the ankle.
8. Multifocal moderate to high-grade stenoses of the left
superficial femoral artery in the region of the abductor canal.
Difficult to exclude short segment occlusion.
9. Severely stenotic bordering on occluded P2 segment of the left
popliteal artery.
10. Occluded left trifurcation with reconstitution of the posterior
tibial artery which provides single-vessel runoff to the ankle.
11. Significant stenosis of the origin of the dominant right renal
artery.
12.  Aortic Atherosclerosis (6CP94-170.0).

NON-VASCULAR

1. Surgical changes of prior distal pancreatectomy and splenectomy
without evidence of complication.
2. Solitary 5 mm nonobstructing stone in the lower pole of the right
kidney.
3. Additional ancillary findings as above.

## 2020-04-06 ENCOUNTER — Encounter: Payer: Self-pay | Admitting: Sports Medicine

## 2020-04-06 ENCOUNTER — Other Ambulatory Visit: Payer: Self-pay

## 2020-04-06 ENCOUNTER — Ambulatory Visit (INDEPENDENT_AMBULATORY_CARE_PROVIDER_SITE_OTHER): Payer: Medicare Other | Admitting: Sports Medicine

## 2020-04-06 DIAGNOSIS — E119 Type 2 diabetes mellitus without complications: Secondary | ICD-10-CM

## 2020-04-06 DIAGNOSIS — B351 Tinea unguium: Secondary | ICD-10-CM | POA: Diagnosis not present

## 2020-04-06 DIAGNOSIS — M79676 Pain in unspecified toe(s): Secondary | ICD-10-CM | POA: Diagnosis not present

## 2020-04-06 DIAGNOSIS — R1013 Epigastric pain: Secondary | ICD-10-CM | POA: Insufficient documentation

## 2020-04-06 NOTE — Progress Notes (Signed)
Subjective: Ricardo Le is a 80 y.o. male patient with history of diabetes who presents to office today complaining of long,mildly painful nails  while ambulating in shoes; unable to trim. Patient states that the glucose reading this morning was $RemoveBef'120mg'eKrFoHPgHl$ /dl. A1c 7 or 8. Patient denies any new changes in medication or new problems.  Review of Systems  All other systems reviewed and are negative.    Patient Active Problem List   Diagnosis Date Noted  . Epigastric pain 04/06/2020  . Personal history of coronary artery disease 02/13/2020  . Postural kyphosis 09/10/2019  . Chronic mesenteric insufficiency ([place]) 05/16/2019  . Chronic idiopathic constipation 04/23/2019  . Gastroesophageal reflux disease without esophagitis 04/23/2019  . Pulmonary embolism and infarction ([place]) 10/19/2018  . DVT (deep venous thrombosis) ([place]) 10/19/2018  . Acute deep vein thrombosis (DVT) of left peroneal vein ([facility])   . Pulmonary embolism ([place])   . DOE (dyspnea on exertion) 10/16/2018  . Traumatic intraventricular hemorrhage ([place]) 07/23/2018  . Intraventricular hemorrhage ([place]) 07/20/2018  . Fracture of left acetabulum ([place]) 07/09/2018  . Elevated troponin   . Coronary artery disease involving native coronary artery of native heart with unstable angina pectoris ([place])   . Chest pain 02/20/2017  . Precordial chest pain   . Angina pectoris ([place]) 02/19/2017  . Atherosclerotic heart disease of native coronary artery with unstable angina pectoris ([place]) 03/08/2015  . Hyperlipidemia 01/26/2015  . Cervical spondylosis without myelopathy 07/28/2014  . Cervical spondylosis 07/28/2014  . Tension-type headache 05/28/2014  . Constipation 05/19/2014  . Pain 05/18/2014  . Neck pain 03/25/2014  . PAD (peripheral artery disease) ([place]) 03/20/2012  . Unstable angina ([place]) 05/05/2011  . DDD (degenerative disc disease)   . HNP (herniated nucleus pulposus), lumbar   . Pancreatic mass   . Nephrolithiasis   . Carotid  bruit   . S/P CABG x 4 03/08/2011  . Bruit 07/06/2010  . NONSPECIFIC ABN FINDING RAD & OTH EXAM GI TRACT 06/09/2009  . Neuroendocrine carcinoma of pancreas ([place]) 06/02/2009  . NAUSEA ALONE 05/28/2009  . CHANGE IN BOWELS 05/28/2009  . Elevated lipids 05/27/2009  . Abdominal pain, generalized 05/27/2009  . Type 2 diabetes mellitus with vascular disease ([place]) 12/16/2008  . Essential hypertension 12/16/2008  . BACK PAIN 12/16/2008  . Palpitations 12/16/2008   Current Outpatient Medications on File Prior to Visit  Medication Sig Dispense Refill  . lactulose (CHRONULAC) 10 GM/15ML solution Take by mouth.    [name] Kitchen acetaminophen (TYLENOL) 325 MG tablet Take 650 mg by mouth every 6 (six) hours as needed (for pain).     [name] Kitchen amLODipine (NORVASC) 10 MG tablet Take 1 tablet (10 mg total) by mouth daily. 30 tablet 1  . aspirin EC 81 MG tablet Take 81 mg by mouth daily.    [name] Kitchen atorvastatin (LIPITOR) 20 MG tablet Take 1 tablet (20 mg total) by mouth at bedtime. 30 tablet 1  . BD PEN NEEDLE NANO 2ND GEN 32G X 4 MM MISC See admin instructions.    . bethanechol (URECHOLINE) 25 MG tablet Take 25 mg by mouth 2 (two) times daily.    . bisacodyl (DULCOLAX) 5 MG EC tablet Take 5 mg by mouth every 3 (three) days.     . Blood Glucose Monitoring Suppl ([place]) w/Device KIT USE TO CHECK BLOOD SUGAR AS DIRECTED    . cephALEXin (KEFLEX) 500 MG capsule Take by mouth 2 (two) times daily.    . diphenhydrAMINE (BENADRYL) 50 MG tablet Take one  50 mg tablet one hour before the test with the last dose of the prednisone. 1 tablet 0  . docusate sodium (COLACE) 100 MG capsule Take 1 capsule (100 mg total) by mouth 2 (two) times daily. 10 capsule 0  . dutasteride (AVODART) 0.5 MG capsule Take 0.5 mg by mouth daily.    [name] Kitchen esomeprazole (NEXIUM) 40 MG capsule Take 30- 60 min before your first and last meals of the day (Patient not taking: Reported on 03/19/2020) 60 capsule 2  . ezetimibe (ZETIA) 10 MG tablet Take 1  tablet (10 mg total) by mouth daily. 90 tablet 3  . gabapentin (NEURONTIN) 300 MG capsule Take 300 mg by mouth 3 (three) times daily.    [name] Kitchen glimepiride (AMARYL) 2 MG tablet Take 4 mg by mouth daily.    [name] Kitchen glipiZIDE (GLUCOTROL) 5 MG tablet Take 0.5 tablets (2.5 mg total) by mouth 2 (two) times daily before a meal. 60 tablet 0  . Insulin Glargine (LANTUS) 100 UNIT/ML Solostar Pen Inject 18 Units into the skin at bedtime. 15 mL 11  . isosorbide mononitrate (IMDUR) 30 MG 24 hr tablet Take 0.5 tablets (15 mg total) by mouth daily. 45 tablet 3  . lisinopril-hydrochlorothiazide (ZESTORETIC) 20-12.5 MG tablet Take 1 tablet by mouth every morning.    . Melatonin 3 MG TABS Take 1 tablet (3 mg total) by mouth at bedtime. 30 tablet 0  . Multiple Vitamin (MULTIVITAMIN WITH MINERALS) TABS tablet Take 1 tablet by mouth daily.    . nitroGLYCERIN (NITROSTAT) 0.4 MG SL tablet Place 1 tablet (0.4 mg total) under the tongue every 5 (five) minutes as needed for chest pain. 25 tablet 3  . nystatin cream (MYCOSTATIN) Apply topically.    Glory Rosebush VERIO test strip 2 (two) times daily.    . pantoprazole (PROTONIX) 40 MG tablet Take 40 mg by mouth 2 (two) times daily.    [name] Kitchen senna (SENOKOT) 8.6 MG TABS tablet Take 1 tablet (8.6 mg total) by mouth at bedtime as needed for mild constipation. (Patient not taking: Reported on 03/19/2020) 120 each 0  . sucralfate (CARAFATE) 1 g tablet Take 1 g by mouth 2 (two) times daily.    . tamsulosin (FLOMAX) 0.4 MG CAPS capsule Take 1 capsule (0.4 mg total) by mouth daily after supper. 30 capsule 0   No current facility-administered medications on file prior to visit.   Allergies  Allergen Reactions  . Dicyclomine Anaphylaxis  . Iodinated Diagnostic Agents Other (See Comments)  . Ioversol   . Oxycodone Hcl Nausea And Vomiting  . Ranexa [Ranolazine] Other (See Comments)    Caused weakness (possibly)  . Rosuvastatin Calcium Other (See Comments)    Muscle pain  . Rosuvastatin Calcium  Other (See Comments)    Myalgias Muscle pain  . Tramadol Other (See Comments)    Makes patient "spaced out and dizzy"  . Iohexol Hives and Itching    Patient is ok with premedication (Benadryl 50 mg by mouth); Onset Date: 12/03/2007  Patient is ok with premedication (Benadryl 50 mg by mouth); Onset Date: 12/03/2007  . Statins Other (See Comments)    Muscle pain, but can tolerate Atorvastatin     Recent Results (from the past 2160 hour(s))  Urinalysis, Routine w reflex microscopic Urine, Clean Catch     Status: None   Collection Time: 01/27/20  5:48 AM  Result Value Ref Range   Color, Urine YELLOW YELLOW   APPearance CLEAR CLEAR   Specific Gravity, Urine 1.009 1.005 -  1.030   pH 6.0 5.0 - 8.0   Glucose, UA NEGATIVE NEGATIVE mg/dL   Hgb urine dipstick NEGATIVE NEGATIVE   Bilirubin Urine NEGATIVE NEGATIVE   Ketones, ur NEGATIVE NEGATIVE mg/dL   Protein, ur NEGATIVE NEGATIVE mg/dL   Nitrite NEGATIVE NEGATIVE   Leukocytes,Ua NEGATIVE NEGATIVE    Comment: Performed at [facility] [address] [place], [place] [address]  Lipase, blood     Status: None   Collection Time: 01/27/20  6:06 AM  Result Value Ref Range   Lipase 27 11 - 51 U/L    Comment: Performed at [facility] [address], [place], [place] [address]  Comprehensive metabolic panel     Status: Abnormal   Collection Time: 01/27/20  6:06 AM  Result Value Ref Range   Sodium 136 135 - 145 mmol/L   Potassium 3.5 3.5 - 5.1 mmol/L   Chloride 97 (L) 98 - 111 mmol/L   CO2 26 22 - 32 mmol/L   Glucose, Bld 109 (H) 70 - 99 mg/dL    Comment: Glucose reference range applies only to samples taken after fasting for at least 8 hours.   BUN 15 8 - 23 mg/dL   Creatinine, Ser 1.02 0.61 - 1.24 mg/dL   Calcium 9.4 8.9 - 10.3 mg/dL   Total Protein 7.2 6.5 - 8.1 g/dL   Albumin 3.6 3.5 - 5.0 g/dL   AST 28 15 - 41 U/L   ALT 36 0 - 44 U/L   Alkaline Phosphatase 60 38 - 126 U/L   Total Bilirubin 0.8 0.3 - 1.2 mg/dL    GFR, Estimated >60 >60 mL/min    Comment: (NOTE) Calculated using the CKD-EPI Creatinine Equation (2021)    Anion gap 13 5 - 15    Comment: Performed at [facility] [address] [place],  [address]  CBC     Status: Abnormal   Collection Time: 01/27/20  6:06 AM  Result Value Ref Range   WBC 7.4 4.0 - 10.5 K/uL   RBC 4.21 (L) 4.22 - 5.81 MIL/uL   Hemoglobin 13.4 13.0 - 17.0 g/dL   HCT 38.6 (L) 39.0 - 52.0 %   MCV 91.7 80.0 - 100.0 fL   MCH 31.8 26.0 - 34.0 pg   MCHC 34.7 30.0 - 36.0 g/dL   RDW 13.2 11.5 - 15.5 %   Platelets 256 150 - 400 K/uL   nRBC 0.0 0.0 - 0.2 %    Comment: Performed at [facility], [place] [address] [place],  [address]  CBG monitoring, ED     Status: Abnormal   Collection Time: 01/27/20  2:08 PM  Result Value Ref Range   Glucose-Capillary 143 (H) 70 - 99 mg/dL    Comment: Glucose reference range applies only to samples taken after fasting for at least 8 hours.    Objective: General: Patient is awake, alert, and oriented x 3 and in no acute distress.  Integument: Skin is warm, dry and supple bilateral. Nails are tender, long, thickened and dystrophic with subungual debris, consistent with onychomycosis, 1-5 bilateral. No signs of infection. No open lesions or preulcerative lesions present bilateral. Remaining integument unremarkable.  Vasculature:  Dorsalis Pedis pulse 1/4 bilateral. Posterior Tibial pulse  1/4 bilateral.  Capillary fill time <5 sec 1-5 bilateral. Scant hair growth to the level of the digits. Temperature gradient within normal limits. No varicosities present bilateral. No edema present bilateral.   Neurology: The patient  has intact sensation measured with a 5.07/10g Semmes Weinstein Monofilament at all pedal sites bilateral . Vibratory sensation diminished bilateral with tuning fork. No Babinski sign present bilateral.   Musculoskeletal: Asymptomatic right 4-5 hammertoe pedal deformities noted. Muscular strength  4/5 in all lower extremity muscular groups bilateral without pain on range of motion. No tenderness with calf compression bilateral.  Assessment and Plan: Problem List Items Addressed This Visit   None   Visit Diagnoses    Pain due to onychomycosis of toenail    -  Primary   Relevant Medications   cephALEXin (KEFLEX) 500 MG capsule   nystatin cream (MYCOSTATIN)   Diabetes mellitus without complication ([place])          -Examined patient. -Discussed and educated patient on diabetic foot care, especially with  regards to the vascular, neurological and musculoskeletal systems.  -Stressed the importance of good glycemic control and the detriment of not  controlling glucose levels in relation to the foot. -Mechanically debrided all nails 1-5 bilateral using sterile nail nipper and filed with dremel without incident  -Dispensed toe spacer for patient to use as instructed on right foot between fourth and fifth toes -Answered all patient questions -Patient to return  in 3 months for at risk foot care -Patient advised to call the office if any problems or questions arise in the meantime.  Landis Martins, DPM

## 2020-04-08 DIAGNOSIS — M4000 Postural kyphosis, site unspecified: Secondary | ICD-10-CM | POA: Diagnosis not present

## 2020-06-09 DIAGNOSIS — H6691 Otitis media, unspecified, right ear: Secondary | ICD-10-CM | POA: Diagnosis not present

## 2020-06-09 DIAGNOSIS — Z6824 Body mass index (BMI) 24.0-24.9, adult: Secondary | ICD-10-CM | POA: Diagnosis not present

## 2020-06-10 DIAGNOSIS — I10 Essential (primary) hypertension: Secondary | ICD-10-CM | POA: Diagnosis not present

## 2020-06-10 DIAGNOSIS — M4000 Postural kyphosis, site unspecified: Secondary | ICD-10-CM | POA: Diagnosis not present

## 2020-06-15 ENCOUNTER — Encounter (INDEPENDENT_AMBULATORY_CARE_PROVIDER_SITE_OTHER): Payer: PPO | Admitting: Ophthalmology

## 2020-06-15 ENCOUNTER — Other Ambulatory Visit: Payer: Self-pay

## 2020-06-15 DIAGNOSIS — I10 Essential (primary) hypertension: Secondary | ICD-10-CM

## 2020-06-15 DIAGNOSIS — H35033 Hypertensive retinopathy, bilateral: Secondary | ICD-10-CM

## 2020-06-15 DIAGNOSIS — E113593 Type 2 diabetes mellitus with proliferative diabetic retinopathy without macular edema, bilateral: Secondary | ICD-10-CM

## 2020-07-06 ENCOUNTER — Ambulatory Visit: Payer: Medicare Other | Admitting: Sports Medicine

## 2020-07-07 DIAGNOSIS — Z6825 Body mass index (BMI) 25.0-25.9, adult: Secondary | ICD-10-CM | POA: Diagnosis not present

## 2020-07-07 DIAGNOSIS — Z13220 Encounter for screening for lipoid disorders: Secondary | ICD-10-CM | POA: Diagnosis not present

## 2020-07-07 DIAGNOSIS — Z0000 Encounter for general adult medical examination without abnormal findings: Secondary | ICD-10-CM | POA: Diagnosis not present

## 2020-07-07 DIAGNOSIS — Z131 Encounter for screening for diabetes mellitus: Secondary | ICD-10-CM | POA: Diagnosis not present

## 2020-07-13 ENCOUNTER — Other Ambulatory Visit: Payer: Self-pay

## 2020-07-13 ENCOUNTER — Ambulatory Visit (INDEPENDENT_AMBULATORY_CARE_PROVIDER_SITE_OTHER): Payer: Medicare Other | Admitting: Sports Medicine

## 2020-07-13 ENCOUNTER — Encounter: Payer: Self-pay | Admitting: Sports Medicine

## 2020-07-13 DIAGNOSIS — B351 Tinea unguium: Secondary | ICD-10-CM | POA: Diagnosis not present

## 2020-07-13 DIAGNOSIS — M79676 Pain in unspecified toe(s): Secondary | ICD-10-CM

## 2020-07-13 DIAGNOSIS — E119 Type 2 diabetes mellitus without complications: Secondary | ICD-10-CM

## 2020-07-13 DIAGNOSIS — H5213 Myopia, bilateral: Secondary | ICD-10-CM | POA: Diagnosis not present

## 2020-07-13 NOTE — Progress Notes (Signed)
Subjective: Ricardo Le is a 80 y.o. male patient with history of diabetes who presents to office today complaining of long,mildly painful nails  while ambulating in shoes; unable to trim. Patient states that the glucose reading this morning was $RemoveBef'156mg'VysSbntvxP$ /dl. A1c unknown, PCP last week Dr. Lin Landsman. Patient denies any new changes in medication or new problems except a little bit of soreness at the left hallux when the bed covers touches the nail.  Patient is assisted by wife this visit. Patient Active Problem List   Diagnosis Date Noted  . Epigastric pain 04/06/2020  . Personal history of coronary artery disease 02/13/2020  . Postural kyphosis 09/10/2019  . Chronic mesenteric insufficiency ([place]) 05/16/2019  . Chronic idiopathic constipation 04/23/2019  . Gastroesophageal reflux disease without esophagitis 04/23/2019  . Pulmonary embolism and infarction ([place]) 10/19/2018  . DVT (deep venous thrombosis) ([place]) 10/19/2018  . Acute deep vein thrombosis (DVT) of left peroneal vein (HCC)   . Pulmonary embolism ([place])   . DOE (dyspnea on exertion) 10/16/2018  . Traumatic intraventricular hemorrhage ([place]) 07/23/2018  . Intraventricular hemorrhage ([place]) 07/20/2018  . Fracture of left acetabulum ([place]) 07/09/2018  . Elevated troponin   . Coronary artery disease involving native coronary artery of native heart with unstable angina pectoris ([place])   . Chest pain 02/20/2017  . Precordial chest pain   . Angina pectoris ([place]) 02/19/2017  . Atherosclerotic heart disease of native coronary artery with unstable angina pectoris ([place]) 03/08/2015  . Hyperlipidemia 01/26/2015  . Cervical spondylosis without myelopathy 07/28/2014  . Cervical spondylosis 07/28/2014  . Tension-type headache 05/28/2014  . Constipation 05/19/2014  . Pain 05/18/2014  . Neck pain 03/25/2014  . PAD (peripheral artery disease) ([place]) 03/20/2012  . Unstable angina ([place]) 05/05/2011  . DDD (degenerative disc disease)   . HNP (herniated  nucleus pulposus), lumbar   . Pancreatic mass   . Nephrolithiasis   . Carotid bruit   . S/P CABG x 4 03/08/2011  . Bruit 07/06/2010  . NONSPECIFIC ABN FINDING RAD & OTH EXAM GI TRACT 06/09/2009  . Neuroendocrine carcinoma of pancreas ([place]) 06/02/2009  . NAUSEA ALONE 05/28/2009  . CHANGE IN BOWELS 05/28/2009  . Elevated lipids 05/27/2009  . Abdominal pain, generalized 05/27/2009  . Type 2 diabetes mellitus with vascular disease ([place]) 12/16/2008  . Essential hypertension 12/16/2008  . BACK PAIN 12/16/2008  . Palpitations 12/16/2008   Current Outpatient Medications on File Prior to Visit  Medication Sig Dispense Refill  . acetaminophen (TYLENOL) 325 MG tablet Take 650 mg by mouth every 6 (six) hours as needed (for pain).     [name] Kitchen amLODipine (NORVASC) 10 MG tablet Take 1 tablet (10 mg total) by mouth daily. 30 tablet 1  . aspirin EC 81 MG tablet Take 81 mg by mouth daily.    [name] Kitchen atorvastatin (LIPITOR) 20 MG tablet Take 1 tablet (20 mg total) by mouth at bedtime. 30 tablet 1  . BD PEN NEEDLE NANO 2ND GEN 32G X 4 MM MISC See admin instructions.    . bethanechol (URECHOLINE) 25 MG tablet Take 25 mg by mouth 2 (two) times daily.    . bisacodyl (DULCOLAX) 5 MG EC tablet Take 5 mg by mouth every 3 (three) days.     . Blood Glucose Monitoring Suppl ([place]) w/Device KIT USE TO CHECK BLOOD SUGAR AS DIRECTED    . cephALEXin (KEFLEX) 500 MG capsule Take by mouth 2 (two) times daily.    . diphenhydrAMINE (BENADRYL) 50 MG tablet Take  one 50 mg tablet one hour before the test with the last dose of the prednisone. 1 tablet 0  . docusate sodium (COLACE) 100 MG capsule Take 1 capsule (100 mg total) by mouth 2 (two) times daily. 10 capsule 0  . dutasteride (AVODART) 0.5 MG capsule Take 0.5 mg by mouth daily.    [name] Kitchen esomeprazole (NEXIUM) 40 MG capsule Take 30- 60 min before your first and last meals of the day (Patient not taking: Reported on 03/19/2020) 60 capsule 2  . ezetimibe (ZETIA) 10 MG  tablet Take 1 tablet (10 mg total) by mouth daily. 90 tablet 3  . gabapentin (NEURONTIN) 300 MG capsule Take 300 mg by mouth 3 (three) times daily.    [name] Kitchen glimepiride (AMARYL) 2 MG tablet Take 4 mg by mouth daily.    [name] Kitchen glipiZIDE (GLUCOTROL) 5 MG tablet Take 0.5 tablets (2.5 mg total) by mouth 2 (two) times daily before a meal. 60 tablet 0  . Insulin Glargine (LANTUS) 100 UNIT/ML Solostar Pen Inject 18 Units into the skin at bedtime. 15 mL 11  . isosorbide mononitrate (IMDUR) 30 MG 24 hr tablet Take 0.5 tablets (15 mg total) by mouth daily. 45 tablet 3  . lisinopril-hydrochlorothiazide (ZESTORETIC) 20-12.5 MG tablet Take 1 tablet by mouth every morning.    . Melatonin 3 MG TABS Take 1 tablet (3 mg total) by mouth at bedtime. 30 tablet 0  . Multiple Vitamin (MULTIVITAMIN WITH MINERALS) TABS tablet Take 1 tablet by mouth daily.    . nitroGLYCERIN (NITROSTAT) 0.4 MG SL tablet Place 1 tablet (0.4 mg total) under the tongue every 5 (five) minutes as needed for chest pain. 25 tablet 3  . nystatin cream (MYCOSTATIN) Apply topically.    Glory Rosebush VERIO test strip 2 (two) times daily.    . pantoprazole (PROTONIX) 40 MG tablet Take 40 mg by mouth 2 (two) times daily.    [name] Kitchen senna (SENOKOT) 8.6 MG TABS tablet Take 1 tablet (8.6 mg total) by mouth at bedtime as needed for mild constipation. (Patient not taking: Reported on 03/19/2020) 120 each 0  . sucralfate (CARAFATE) 1 g tablet Take 1 g by mouth 2 (two) times daily.    . tamsulosin (FLOMAX) 0.4 MG CAPS capsule Take 1 capsule (0.4 mg total) by mouth daily after supper. 30 capsule 0   No current facility-administered medications on file prior to visit.   Allergies  Allergen Reactions  . Dicyclomine Anaphylaxis  . Iodinated Diagnostic Agents Other (See Comments)  . Ioversol   . Oxycodone Hcl Nausea And Vomiting  . Ranexa [Ranolazine] Other (See Comments)    Caused weakness (possibly)  . Rosuvastatin Calcium Other (See Comments)    Muscle pain  .  Rosuvastatin Calcium Other (See Comments)    Myalgias Muscle pain  . Tramadol Other (See Comments)    Makes patient "spaced out and dizzy"  . Iohexol Hives and Itching    Patient is ok with premedication (Benadryl 50 mg by mouth); Onset Date: 12/03/2007  Patient is ok with premedication (Benadryl 50 mg by mouth); Onset Date: 12/03/2007  . Statins Other (See Comments)    Muscle pain, but can tolerate Atorvastatin     No results found for this or any previous visit (from the past 2160 hour(s)).  Objective: General: Patient is awake, alert, and oriented x 3 and in no acute distress.  Integument: Skin is warm, dry and supple bilateral. Nails are tender, long, thickened and dystrophic with subungual debris, consistent with onychomycosis, 1-5  bilateral. No signs of infection. No open lesions or preulcerative lesions present bilateral. Remaining integument unremarkable.  Vasculature:  Dorsalis Pedis pulse 1/4 bilateral. Posterior Tibial pulse  1/4 bilateral.  Capillary fill time <5 sec 1-5 bilateral. Scant hair growth to the level of the digits. Temperature gradient within normal limits. No varicosities present bilateral. No edema present bilateral.   Neurology: The patient has intact sensation measured with a 5.07/10g Semmes Weinstein Monofilament at all pedal sites bilateral . Vibratory sensation diminished bilateral with tuning fork. No Babinski sign present bilateral.   Musculoskeletal: Asymptomatic right 4-5 hammertoe pedal deformities noted. Muscular strength 4/5 in all lower extremity muscular groups bilateral without pain on range of motion. No tenderness with calf compression bilateral.  Assessment and Plan: Problem List Items Addressed This Visit   None   Visit Diagnoses    Pain due to onychomycosis of toenail    -  Primary   Diabetes mellitus without complication ([place])          -Examined patient. -Discussed and educated patient on diabetic foot care, especially with   regards to the vascular, neurological and musculoskeletal systems.  -Mechanically debrided all nails 1-5 bilateral using sterile nail nipper and filed with dremel without incident  -Advised patient that nails can get sore when they go out long, however there is no acute ingrowing noted at this encounter -Answered all patient questions -Patient to return  in 2-1/2 months for at risk foot care -Patient advised to call the office if any problems or questions arise in the meantime.  Landis Martins, DPM

## 2020-07-22 DIAGNOSIS — M47816 Spondylosis without myelopathy or radiculopathy, lumbar region: Secondary | ICD-10-CM | POA: Diagnosis not present

## 2020-07-22 DIAGNOSIS — M961 Postlaminectomy syndrome, not elsewhere classified: Secondary | ICD-10-CM | POA: Diagnosis not present

## 2020-07-23 DIAGNOSIS — M47816 Spondylosis without myelopathy or radiculopathy, lumbar region: Secondary | ICD-10-CM | POA: Diagnosis not present

## 2020-09-14 DIAGNOSIS — D510 Vitamin B12 deficiency anemia due to intrinsic factor deficiency: Secondary | ICD-10-CM | POA: Diagnosis not present

## 2020-09-22 DIAGNOSIS — M47816 Spondylosis without myelopathy or radiculopathy, lumbar region: Secondary | ICD-10-CM | POA: Diagnosis not present

## 2020-10-20 DIAGNOSIS — M47816 Spondylosis without myelopathy or radiculopathy, lumbar region: Secondary | ICD-10-CM | POA: Diagnosis not present

## 2020-11-05 ENCOUNTER — Ambulatory Visit: Payer: Medicare Other | Admitting: Sports Medicine

## 2020-11-06 ENCOUNTER — Emergency Department (HOSPITAL_COMMUNITY): Payer: Medicare Other

## 2020-11-06 ENCOUNTER — Other Ambulatory Visit: Payer: Self-pay | Admitting: Cardiovascular Disease

## 2020-11-06 ENCOUNTER — Inpatient Hospital Stay (HOSPITAL_COMMUNITY)
Admission: EM | Admit: 2020-11-06 | Discharge: 2020-11-11 | DRG: 247 | Disposition: A | Discharge status: Home Health | Payer: Medicare Other | Attending: Cardiology | Admitting: Cardiology

## 2020-11-06 ENCOUNTER — Encounter (HOSPITAL_COMMUNITY)
Admission: EM | Disposition: A | Discharge status: Home Health | Payer: Self-pay | Source: Home / Self Care | Attending: Cardiology

## 2020-11-06 DIAGNOSIS — I213 ST elevation (STEMI) myocardial infarction of unspecified site: Secondary | ICD-10-CM | POA: Diagnosis not present

## 2020-11-06 DIAGNOSIS — Z955 Presence of coronary angioplasty implant and graft: Secondary | ICD-10-CM

## 2020-11-06 DIAGNOSIS — Z794 Long term (current) use of insulin: Secondary | ICD-10-CM

## 2020-11-06 DIAGNOSIS — J9811 Atelectasis: Secondary | ICD-10-CM | POA: Diagnosis not present

## 2020-11-06 DIAGNOSIS — Z951 Presence of aortocoronary bypass graft: Secondary | ICD-10-CM

## 2020-11-06 DIAGNOSIS — Z8249 Family history of ischemic heart disease and other diseases of the circulatory system: Secondary | ICD-10-CM | POA: Diagnosis not present

## 2020-11-06 DIAGNOSIS — I25810 Atherosclerosis of coronary artery bypass graft(s) without angina pectoris: Secondary | ICD-10-CM | POA: Diagnosis not present

## 2020-11-06 DIAGNOSIS — E1159 Type 2 diabetes mellitus with other circulatory complications: Secondary | ICD-10-CM | POA: Diagnosis present

## 2020-11-06 DIAGNOSIS — Z20822 Contact with and (suspected) exposure to covid-19: Secondary | ICD-10-CM | POA: Diagnosis present

## 2020-11-06 DIAGNOSIS — I255 Ischemic cardiomyopathy: Secondary | ICD-10-CM | POA: Diagnosis present

## 2020-11-06 DIAGNOSIS — I10 Essential (primary) hypertension: Secondary | ICD-10-CM | POA: Diagnosis present

## 2020-11-06 DIAGNOSIS — Z7984 Long term (current) use of oral hypoglycemic drugs: Secondary | ICD-10-CM | POA: Diagnosis not present

## 2020-11-06 DIAGNOSIS — I4510 Unspecified right bundle-branch block: Secondary | ICD-10-CM | POA: Diagnosis present

## 2020-11-06 DIAGNOSIS — K219 Gastro-esophageal reflux disease without esophagitis: Secondary | ICD-10-CM | POA: Diagnosis not present

## 2020-11-06 DIAGNOSIS — D72828 Other elevated white blood cell count: Secondary | ICD-10-CM | POA: Diagnosis not present

## 2020-11-06 DIAGNOSIS — Z86718 Personal history of other venous thrombosis and embolism: Secondary | ICD-10-CM

## 2020-11-06 DIAGNOSIS — I2699 Other pulmonary embolism without acute cor pulmonale: Secondary | ICD-10-CM | POA: Diagnosis present

## 2020-11-06 DIAGNOSIS — Z79899 Other long term (current) drug therapy: Secondary | ICD-10-CM

## 2020-11-06 DIAGNOSIS — Z833 Family history of diabetes mellitus: Secondary | ICD-10-CM | POA: Diagnosis not present

## 2020-11-06 DIAGNOSIS — I2121 ST elevation (STEMI) myocardial infarction involving left circumflex coronary artery: Secondary | ICD-10-CM | POA: Diagnosis not present

## 2020-11-06 DIAGNOSIS — R0600 Dyspnea, unspecified: Secondary | ICD-10-CM | POA: Diagnosis not present

## 2020-11-06 DIAGNOSIS — E1151 Type 2 diabetes mellitus with diabetic peripheral angiopathy without gangrene: Secondary | ICD-10-CM | POA: Diagnosis not present

## 2020-11-06 DIAGNOSIS — I493 Ventricular premature depolarization: Secondary | ICD-10-CM | POA: Diagnosis present

## 2020-11-06 DIAGNOSIS — E785 Hyperlipidemia, unspecified: Secondary | ICD-10-CM | POA: Diagnosis present

## 2020-11-06 DIAGNOSIS — Z7982 Long term (current) use of aspirin: Secondary | ICD-10-CM | POA: Diagnosis not present

## 2020-11-06 DIAGNOSIS — I2510 Atherosclerotic heart disease of native coronary artery without angina pectoris: Secondary | ICD-10-CM | POA: Diagnosis present

## 2020-11-06 DIAGNOSIS — Z9081 Acquired absence of spleen: Secondary | ICD-10-CM

## 2020-11-06 DIAGNOSIS — Z86711 Personal history of pulmonary embolism: Secondary | ICD-10-CM

## 2020-11-06 DIAGNOSIS — R079 Chest pain, unspecified: Secondary | ICD-10-CM | POA: Diagnosis not present

## 2020-11-06 DIAGNOSIS — E119 Type 2 diabetes mellitus without complications: Secondary | ICD-10-CM | POA: Diagnosis present

## 2020-11-06 DIAGNOSIS — E7800 Pure hypercholesterolemia, unspecified: Secondary | ICD-10-CM | POA: Diagnosis present

## 2020-11-06 DIAGNOSIS — R0902 Hypoxemia: Secondary | ICD-10-CM

## 2020-11-06 DIAGNOSIS — I517 Cardiomegaly: Secondary | ICD-10-CM | POA: Diagnosis not present

## 2020-11-06 DIAGNOSIS — R0789 Other chest pain: Secondary | ICD-10-CM | POA: Diagnosis not present

## 2020-11-06 DIAGNOSIS — I2129 ST elevation (STEMI) myocardial infarction involving other sites: Principal | ICD-10-CM | POA: Diagnosis present

## 2020-11-06 DIAGNOSIS — N400 Enlarged prostate without lower urinary tract symptoms: Secondary | ICD-10-CM | POA: Diagnosis present

## 2020-11-06 DIAGNOSIS — R739 Hyperglycemia, unspecified: Secondary | ICD-10-CM | POA: Diagnosis not present

## 2020-11-06 DIAGNOSIS — I615 Nontraumatic intracerebral hemorrhage, intraventricular: Secondary | ICD-10-CM

## 2020-11-06 DIAGNOSIS — I739 Peripheral vascular disease, unspecified: Secondary | ICD-10-CM | POA: Diagnosis present

## 2020-11-06 HISTORY — PX: CORONARY/GRAFT ACUTE MI REVASCULARIZATION: CATH118305

## 2020-11-06 HISTORY — PX: LEFT HEART CATH AND CORONARY ANGIOGRAPHY: CATH118249

## 2020-11-06 LAB — CBC WITH DIFFERENTIAL/PLATELET
Abs Immature Granulocytes: 0.08 10*3/uL — ABNORMAL HIGH (ref 0.00–0.07)
Basophils Absolute: 0.1 10*3/uL (ref 0.0–0.1)
Basophils Relative: 1 %
Eosinophils Absolute: 0.1 10*3/uL (ref 0.0–0.5)
Eosinophils Relative: 0 %
HCT: 42.4 % (ref 39.0–52.0)
Hemoglobin: 14.3 g/dL (ref 13.0–17.0)
Immature Granulocytes: 1 %
Lymphocytes Relative: 19 %
Lymphs Abs: 2.5 10*3/uL (ref 0.7–4.0)
MCH: 31.3 pg (ref 26.0–34.0)
MCHC: 33.7 g/dL (ref 30.0–36.0)
MCV: 92.8 fL (ref 80.0–100.0)
Monocytes Absolute: 1.4 10*3/uL — ABNORMAL HIGH (ref 0.1–1.0)
Monocytes Relative: 10 %
Neutro Abs: 9.2 10*3/uL — ABNORMAL HIGH (ref 1.7–7.7)
Neutrophils Relative %: 69 %
Platelets: 238 10*3/uL (ref 150–400)
RBC: 4.57 MIL/uL (ref 4.22–5.81)
RDW: 13.2 % (ref 11.5–15.5)
WBC: 13.4 10*3/uL — ABNORMAL HIGH (ref 4.0–10.5)
nRBC: 0 % (ref 0.0–0.2)

## 2020-11-06 LAB — COMPREHENSIVE METABOLIC PANEL
ALT: 22 U/L (ref 0–44)
AST: 23 U/L (ref 15–41)
Albumin: 3.8 g/dL (ref 3.5–5.0)
Alkaline Phosphatase: 73 U/L (ref 38–126)
Anion gap: 15 (ref 5–15)
BUN: 18 mg/dL (ref 8–23)
CO2: 22 mmol/L (ref 22–32)
Calcium: 9.6 mg/dL (ref 8.9–10.3)
Chloride: 100 mmol/L (ref 98–111)
Creatinine, Ser: 0.95 mg/dL (ref 0.61–1.24)
GFR, Estimated: >60 mL/min (ref >60)
Glucose, Bld: 298 mg/dL — ABNORMAL HIGH (ref 70–99)
Potassium: 3.6 mmol/L (ref 3.5–5.1)
Sodium: 137 mmol/L (ref 135–145)
Total Bilirubin: 0.9 mg/dL (ref 0.3–1.2)
Total Protein: 7.9 g/dL (ref 6.5–8.1)

## 2020-11-06 LAB — PROTIME-INR
INR: 1 (ref 0.8–1.2)
Prothrombin Time: 13.5 seconds (ref 11.4–15.2)

## 2020-11-06 LAB — CBC
HCT: 38.9 % — ABNORMAL LOW (ref 39.0–52.0)
Hemoglobin: 13.6 g/dL (ref 13.0–17.0)
MCH: 31.9 pg (ref 26.0–34.0)
MCHC: 35 g/dL (ref 30.0–36.0)
MCV: 91.1 fL (ref 80.0–100.0)
Platelets: 243 10*3/uL (ref 150–400)
RBC: 4.27 MIL/uL (ref 4.22–5.81)
RDW: 13.3 % (ref 11.5–15.5)
WBC: 16.7 10*3/uL — ABNORMAL HIGH (ref 4.0–10.5)
nRBC: 0 % (ref 0.0–0.2)

## 2020-11-06 LAB — POCT ACTIVATED CLOTTING TIME: Activated Clotting Time: 433 seconds

## 2020-11-06 LAB — LIPID PANEL
Cholesterol: 164 mg/dL (ref 0–200)
HDL: 50 mg/dL (ref >40)
LDL Cholesterol: 89 mg/dL (ref 0–99)
Total CHOL/HDL Ratio: 3.3 RATIO
Triglycerides: 123 mg/dL (ref <150)
VLDL: 25 mg/dL (ref 0–40)

## 2020-11-06 LAB — RESP PANEL BY RT-PCR (FLU A&B, COVID) ARPGX2
Influenza A by PCR: NEGATIVE
Influenza B by PCR: NEGATIVE
SARS Coronavirus 2 by RT PCR: NEGATIVE

## 2020-11-06 LAB — GLUCOSE, CAPILLARY: Glucose-Capillary: 385 mg/dL — ABNORMAL HIGH (ref 70–99)

## 2020-11-06 LAB — TROPONIN I (HIGH SENSITIVITY): Troponin I (High Sensitivity): 24 ng/L — ABNORMAL HIGH (ref <18)

## 2020-11-06 LAB — HEMOGLOBIN A1C
Hgb A1c MFr Bld: 7.4 % — ABNORMAL HIGH (ref 4.8–5.6)
Mean Plasma Glucose: 165.68 mg/dL

## 2020-11-06 LAB — APTT: aPTT: 24 seconds (ref 24–36)

## 2020-11-06 SURGERY — CORONARY/GRAFT ACUTE MI REVASCULARIZATION
Anesthesia: LOCAL

## 2020-11-06 MED ORDER — DUTASTERIDE 0.5 MG PO CAPS
0.5000 mg | ORAL_CAPSULE | Freq: Every day | ORAL | Status: DC
  Administered 2020-11-07 – 2020-11-11 (×5): 0.5 mg via ORAL
  Filled 2020-11-06 (×5): qty 1

## 2020-11-06 MED ORDER — EZETIMIBE 10 MG PO TABS
10.0000 mg | ORAL_TABLET | Freq: Every day | ORAL | Status: DC
  Administered 2020-11-07 – 2020-11-11 (×5): 10 mg via ORAL
  Filled 2020-11-06 (×5): qty 1

## 2020-11-06 MED ORDER — ATORVASTATIN CALCIUM 80 MG PO TABS: 80.0000 mg | ORAL_TABLET | Freq: Every day | ORAL | Status: DC

## 2020-11-06 MED ORDER — ONDANSETRON HCL 4 MG/2ML IJ SOLN: 4.0000 mg | Freq: Four times a day (QID) | INTRAMUSCULAR | Status: DC | PRN

## 2020-11-06 MED ORDER — DIPHENHYDRAMINE HCL 50 MG/ML IJ SOLN
25.0000 mg | Freq: Once | INTRAMUSCULAR | Status: AC
  Administered 2020-11-06: 25 mg via INTRAVENOUS

## 2020-11-06 MED ORDER — METHYLPREDNISOLONE SODIUM SUCC 125 MG IJ SOLR
INTRAMUSCULAR | Status: AC
  Filled 2020-11-06: qty 2

## 2020-11-06 MED ORDER — HEPARIN (PORCINE) IN NACL 1000-0.9 UT/500ML-% IV SOLN
INTRAVENOUS | Status: DC | PRN
  Administered 2020-11-06 (×2): 500 mL

## 2020-11-06 MED ORDER — BETHANECHOL CHLORIDE 25 MG PO TABS
25.0000 mg | ORAL_TABLET | Freq: Two times a day (BID) | ORAL | Status: DC
  Administered 2020-11-06 – 2020-11-11 (×10): 25 mg via ORAL
  Filled 2020-11-06 (×11): qty 1

## 2020-11-06 MED ORDER — DIPHENHYDRAMINE HCL 50 MG/ML IJ SOLN
INTRAMUSCULAR | Status: AC
  Filled 2020-11-06: qty 1

## 2020-11-06 MED ORDER — ASPIRIN EC 81 MG PO TBEC
81.0000 mg | DELAYED_RELEASE_TABLET | Freq: Every day | ORAL | Status: DC
  Administered 2020-11-07 – 2020-11-11 (×5): 81 mg via ORAL
  Filled 2020-11-06 (×5): qty 1

## 2020-11-06 MED ORDER — TICAGRELOR 90 MG PO TABS
90.0000 mg | ORAL_TABLET | Freq: Two times a day (BID) | ORAL | Status: DC
  Administered 2020-11-07 – 2020-11-11 (×9): 90 mg via ORAL
  Filled 2020-11-06 (×9): qty 1

## 2020-11-06 MED ORDER — METHYLPREDNISOLONE SODIUM SUCC 125 MG IJ SOLR
125.0000 mg | Freq: Once | INTRAMUSCULAR | Status: AC
  Administered 2020-11-06: 125 mg via INTRAVENOUS

## 2020-11-06 MED ORDER — SODIUM CHLORIDE 0.9 % IV SOLN: 250.0000 mL | INTRAVENOUS | Status: DC

## 2020-11-06 MED ORDER — LIDOCAINE HCL (PF) 1 % IJ SOLN
INTRAMUSCULAR | Status: AC
  Filled 2020-11-06: qty 30

## 2020-11-06 MED ORDER — HEPARIN SODIUM (PORCINE) 5000 UNIT/ML IJ SOLN
4000.0000 [IU] | Freq: Once | INTRAMUSCULAR | Status: AC
  Administered 2020-11-06: 4000 [IU] via INTRAVENOUS

## 2020-11-06 MED ORDER — PANTOPRAZOLE SODIUM 40 MG PO TBEC
40.0000 mg | DELAYED_RELEASE_TABLET | Freq: Every day | ORAL | Status: DC
  Administered 2020-11-06 – 2020-11-11 (×6): 40 mg via ORAL
  Filled 2020-11-06 (×6): qty 1

## 2020-11-06 MED ORDER — SODIUM CHLORIDE 0.9 % IV SOLN: 250.0000 mL | INTRAVENOUS | Status: DC | PRN

## 2020-11-06 MED ORDER — SODIUM CHLORIDE 0.9 % WEIGHT BASED INFUSION
1.0000 mL/kg/h | INTRAVENOUS | Status: AC
  Administered 2020-11-06: 1 mL/kg/h via INTRAVENOUS

## 2020-11-06 MED ORDER — SODIUM CHLORIDE 0.9% FLUSH
3.0000 mL | Freq: Two times a day (BID) | INTRAVENOUS | Status: DC
  Administered 2020-11-07 – 2020-11-11 (×9): 3 mL via INTRAVENOUS

## 2020-11-06 MED ORDER — NOREPINEPHRINE 4 MG/250ML-% IV SOLN
INTRAVENOUS | Status: AC
  Filled 2020-11-06: qty 250

## 2020-11-06 MED ORDER — HEPARIN (PORCINE) IN NACL 1000-0.9 UT/500ML-% IV SOLN
INTRAVENOUS | Status: AC
  Filled 2020-11-06: qty 500

## 2020-11-06 MED ORDER — INSULIN GLARGINE 100 UNIT/ML SOLOSTAR PEN: 18.0000 [IU] | PEN_INJECTOR | Freq: Every day | SUBCUTANEOUS | Status: DC

## 2020-11-06 MED ORDER — ACETAMINOPHEN 325 MG PO TABS
650.0000 mg | ORAL_TABLET | ORAL | Status: DC | PRN
  Administered 2020-11-07: 650 mg via ORAL
  Filled 2020-11-06: qty 2

## 2020-11-06 MED ORDER — VERAPAMIL HCL 2.5 MG/ML IV SOLN
INTRAVENOUS | Status: AC
  Filled 2020-11-06: qty 2

## 2020-11-06 MED ORDER — TICAGRELOR 90 MG PO TABS
ORAL_TABLET | ORAL | Status: DC | PRN
  Administered 2020-11-06: 180 mg via ORAL

## 2020-11-06 MED ORDER — NOREPINEPHRINE BITARTRATE 1 MG/ML IV SOLN
INTRAVENOUS | Status: AC | PRN
  Administered 2020-11-06: 5 ug/kg/min via INTRAVENOUS

## 2020-11-06 MED ORDER — BIVALIRUDIN BOLUS VIA INFUSION - CUPID
INTRAVENOUS | Status: DC | PRN
  Administered 2020-11-06: 53.775 mg via INTRAVENOUS

## 2020-11-06 MED ORDER — GLIMEPIRIDE 4 MG PO TABS
4.0000 mg | ORAL_TABLET | Freq: Every day | ORAL | Status: DC
  Administered 2020-11-07 – 2020-11-11 (×5): 4 mg via ORAL
  Filled 2020-11-06 (×5): qty 1

## 2020-11-06 MED ORDER — NOREPINEPHRINE 4 MG/250ML-% IV SOLN: 0.0000 ug/min | INTRAVENOUS | Status: DC

## 2020-11-06 MED ORDER — HEPARIN (PORCINE) IN NACL 1000-0.9 UT/500ML-% IV SOLN
INTRAVENOUS | Status: AC
  Filled 2020-11-06: qty 1000

## 2020-11-06 MED ORDER — SODIUM CHLORIDE 0.9% FLUSH: 3.0000 mL | INTRAVENOUS | Status: DC | PRN

## 2020-11-06 MED ORDER — NITROGLYCERIN IN D5W 200-5 MCG/ML-% IV SOLN
0.0000 ug/min | INTRAVENOUS | Status: DC
Start: 2020-11-06 — End: 2020-11-06

## 2020-11-06 MED ORDER — GABAPENTIN 300 MG PO CAPS
300.0000 mg | ORAL_CAPSULE | Freq: Three times a day (TID) | ORAL | Status: DC
  Administered 2020-11-06 – 2020-11-11 (×14): 300 mg via ORAL
  Filled 2020-11-06 (×14): qty 1

## 2020-11-06 MED ORDER — SODIUM CHLORIDE 0.9 % IV SOLN: INTRAVENOUS | Status: DC

## 2020-11-06 MED ORDER — HEPARIN SODIUM (PORCINE) 5000 UNIT/ML IJ SOLN
5000.0000 [IU] | Freq: Three times a day (TID) | INTRAMUSCULAR | Status: DC
  Administered 2020-11-07 – 2020-11-11 (×13): 5000 [IU] via SUBCUTANEOUS
  Filled 2020-11-06 (×13): qty 1

## 2020-11-06 MED ORDER — IOHEXOL 350 MG/ML SOLN
INTRAVENOUS | Status: DC | PRN
  Administered 2020-11-06: 130 mL via INTRA_ARTERIAL

## 2020-11-06 MED ORDER — LIDOCAINE HCL (PF) 1 % IJ SOLN
INTRAMUSCULAR | Status: DC | PRN
  Administered 2020-11-06: 12 mL

## 2020-11-06 MED ORDER — BIVALIRUDIN TRIFLUOROACETATE 250 MG IV SOLR
INTRAVENOUS | Status: AC
  Filled 2020-11-06: qty 250

## 2020-11-06 MED ORDER — ASPIRIN 81 MG PO CHEW: 324.0000 mg | CHEWABLE_TABLET | Freq: Once | ORAL | Status: DC

## 2020-11-06 MED ORDER — TAMSULOSIN HCL 0.4 MG PO CAPS
0.4000 mg | ORAL_CAPSULE | Freq: Every day | ORAL | Status: DC
  Administered 2020-11-07 – 2020-11-10 (×4): 0.4 mg via ORAL
  Filled 2020-11-06 (×4): qty 1

## 2020-11-06 MED ORDER — HEPARIN SODIUM (PORCINE) 1000 UNIT/ML IJ SOLN
INTRAMUSCULAR | Status: AC
  Filled 2020-11-06: qty 1

## 2020-11-06 MED ORDER — INSULIN GLARGINE-YFGN 100 UNIT/ML ~~LOC~~ SOLN
18.0000 [IU] | Freq: Every day | SUBCUTANEOUS | Status: DC
  Administered 2020-11-06 – 2020-11-08 (×3): 18 [IU] via SUBCUTANEOUS
  Filled 2020-11-06 (×4): qty 0.18

## 2020-11-06 MED ORDER — TICAGRELOR 90 MG PO TABS
ORAL_TABLET | ORAL | Status: AC
  Filled 2020-11-06: qty 2

## 2020-11-06 MED ORDER — SODIUM CHLORIDE 0.9 % IV SOLN
INTRAVENOUS | Status: AC | PRN
  Administered 2020-11-06: 1.75 mg/kg/h via INTRAVENOUS

## 2020-11-06 SURGICAL SUPPLY — 21 items
BALLN SAPPHIRE 2.5X15 (BALLOONS) ×2
BALLOON SAPPHIRE 2.5X15 (BALLOONS) IMPLANT
CATH EXTRAC PRONTO 5.5F 138CM (CATHETERS) ×1 IMPLANT
CATH INFINITI 5FR JL4 (CATHETERS) ×1 IMPLANT
CATH INFINITI JR4 5F (CATHETERS) ×1 IMPLANT
CATH VISTA GUIDE 6FR AL1 (CATHETERS) ×1 IMPLANT
GLIDESHEATH SLEND SS 6F .021 (SHEATH) IMPLANT
GUIDEWIRE INQWIRE 1.5J.035X260 (WIRE) IMPLANT
INQWIRE 1.5J .035X260CM (WIRE)
KIT ENCORE 26 ADVANTAGE (KITS) ×1 IMPLANT
KIT HEART LEFT (KITS) ×2 IMPLANT
PACK CARDIAC CATHETERIZATION (CUSTOM PROCEDURE TRAY) ×2 IMPLANT
SHEATH PINNACLE 6F 10CM (SHEATH) ×1 IMPLANT
SHEATH PROBE COVER 6X72 (BAG) ×1 IMPLANT
STENT ONYX FRONTIER 2.5X30 (Permanent Stent) IMPLANT
STENT ONYX FRONTIER 3.5X30 (Permanent Stent) ×1 IMPLANT
TRANSDUCER W/STOPCOCK (MISCELLANEOUS) ×2 IMPLANT
TUBING CIL FLEX 10 FLL-RA (TUBING) ×2 IMPLANT
WIRE ASAHI PROWATER 180CM (WIRE) ×1 IMPLANT
WIRE EMERALD 3MM-J .025X260CM (WIRE) IMPLANT
WIRE HI TORQ VERSACORE-J 145CM (WIRE) ×1 IMPLANT

## 2020-11-06 NOTE — Progress Notes (Signed)
   11/06/20 1900  Clinical Encounter Type  Visited With Patient and family together  Visit Type Initial;Spiritual support;Psychological support;ED  Referral From Nurse  Consult/Referral To Chaplain  Spiritual Encounters  Spiritual Needs Prayer;[facility] text;Emotional  Stress Factors  Patient Stress Factors Loss of control;Loss;Major life changes  Family Stress Factors Exhausted;Loss of control;Major life changes  Chaplain met patient in ED room # 3 today for STEMI page.  Met son at bedside as he was being transferred to [facility] .  Provided spiritual care in form of listening, comforting words of prayer and anointing of oil.  Will remain available as needed.

## 2020-11-06 NOTE — ED Triage Notes (Signed)
Pt BIB [facility] from home after reporting CP that started 7 hours ago. Initial HR 32, EMS reports intermittent bout of v-fib lasting ~10s. Pt reports increased SOB x2 days, hx of CABG 12 yrs ago. Ems administered 324 mg aspirin, 4mg  zofran, 100 fentanyl. Pt reported CP 7/10, pt reported improvement. Pt difficult to rouse at this time.

## 2020-11-06 NOTE — H&P (Signed)
Cardiology Admission History and Physical:   Patient ID: [name] MRN: [id number]; DOB: 16-Sep-1940   Admission date: 11/06/2020  PCP:  Angelina Sheriff, MD   [facility] Cardiologist:  Jenkins Rouge, MD        Chief Complaint:  Chest pain  Patient Profile:   Ricardo Le is a 80 y.o. male with CAD s/p CABG X4  in 2009, GERD, PAD, HLD, T2DM who is being seen 11/06/2020 for the evaluation of chest pain/STEMI  History of Present Illness:   Ricardo Le  is a 80 y.o. male with CAD s/p CABG X4  in 2009, GERD, PAD, HLD, T2DM who is being seen 11/06/2020 for the evaluation of chest pain/STEMI. He was in usual state of health till 7 hours ago prior to presentation when started having chest pain radiating to left shoulder blade. The chest pain worsened hence EMS was called. While on the EMS, there was ?VT and apparently had LOC but recovered on its own. Oval Linsey tried to call it as a code STEMI in the field.  But that EKG was inconclusive. Here in our ED, EKG had more dynamic changes concerning for STEMI hence STEMI was activated. He was given Aspirin and 4000 U of heparin and taken to the [facility]. He did not receive any nitroglycerin. He had some nausea.  No leg swelling or cool extremities.    Past Medical History:  Diagnosis Date   CAD (coronary artery disease)    a. 04/2007 s/p CABG x 4  - LIMA->LAD, VG->OM1->Om2, VG->PDA;  b. 12/2008 & 06/2010 Caths - Native multivessel dzs with 4/4 patent grafts. c. cath 03/09/2015 patent grafts, unchanged from prior cath.   Cancer [facility])    pancreatic  March 2011   Carotid bruit    a. 07/2010 U/S- 0-39% bilat ICA stenosis   Chest pain    Noncardiac probably related to reflux   DDD (degenerative disc disease)    several surgeries   Fracture acetabulum-closed ([place]) 07/08/2018   GERD (gastroesophageal reflux disease)    HNP (herniated nucleus pulposus), lumbar    a. L2-3, s/p laminotomy, microdiskectomy 02/2009   HOH (hard of hearing)     uses amplifiers    Nephrolithiasis    PAD (peripheral artery disease) ([place]) 04/2014   s/p L Common Iliac Stent   Palpitations    Pancreatic mass    a. 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   Pneumonia    hx of  pna   Pure hypercholesterolemia    statin intolerance   Type II or unspecified type diabetes mellitus without mention of complication, not stated as uncontrolled    insulin dependent   Unspecified essential hypertension     Past Surgical History:  Procedure Laterality Date   ABDOMINAL AORTAGRAM N/A 05/13/2014   Procedure: ABDOMINAL [name];  Surgeon: Wellington Hampshire, MD;  Location: [facility] CATH LAB;  Service: Cardiovascular;  Laterality: N/A;   ANTERIOR CERVICAL DECOMP/DISCECTOMY FUSION N/A 07/28/2014   Procedure: ANTERIOR CERVICAL DECOMPRESSION/DISCECTOMY FUSION CERVICAL FOUR-FIVE CERVICAL FIVE-SIX ;  Surgeon: Earnie Larsson, MD;  Location: [facility] NEURO ORS;  Service: Neurosurgery;  Laterality: N/A;   APPENDECTOMY     arthroscopy right knee     BACK SURGERY     CARDIAC CATHETERIZATION N/A 03/09/2015   Procedure: Left Heart Cath and Cors/Grafts Angiography;  Surgeon: [name] Sine, MD;  Location: [facility] CV LAB;  Service: Cardiovascular;  Laterality: N/A;   CHOLECYSTECTOMY     CORONARY  ARTERY BYPASS GRAFT     X 4 2009 (LIMA to LAD, SVG to first cicumflex marginal branch with sequential SVG to second cicumflex marginal branch  and saphenous vein to posterior descending coronary artery with endoscopic,vein harvest rt. lower exttremity by Dr.Owen on March 31,2009   CYST REMOVED     FROM SPINE   CYSTOURETHROSCOPY     right retrograde pyelogram,manipulate stone in the renal pelvis, rt. double-j catheter.   EYE SURGERY     KNEE SURGERY     rt knee   LEFT HEART CATH AND CORS/GRAFTS ANGIOGRAPHY N/A 02/20/2017   Procedure: LEFT HEART CATH AND CORS/GRAFTS ANGIOGRAPHY;  Surgeon: Leonie Man, MD;  Location: [facility] CV LAB;  Service: Cardiovascular;  Laterality:  N/A;   LEFT HEART CATH AND CORS/GRAFTS ANGIOGRAPHY N/A 05/28/2018   Procedure: LEFT HEART CATH AND CORS/GRAFTS ANGIOGRAPHY;  Surgeon: Burnell Blanks, MD;  Location: [facility] CV LAB;  Service: Cardiovascular;  Laterality: N/A;   NEPHROLITHOTOMY     PERCUTANEOUS   PARS PLANA VITRECTOMY     lt. eye,retinal photocoagulation lt. eye, membrane peel lt. eye.   PERCUTANEOUS STENT INTERVENTION Left 05/13/2014   Procedure: PERCUTANEOUS STENT INTERVENTION;  Surgeon: Wellington Hampshire, MD;  Location: [facility] CATH LAB;  Service: Cardiovascular;  Laterality: Left;  COMMON ILLIAC   re-exploratin of laminectomy  05/2008   (RT L2-3) WITH REDO MICRODISKECTOMY     Medications Prior to Admission: Prior to Admission medications   Medication Sig Start Date End Date Taking? Authorizing Provider  acetaminophen (TYLENOL) 325 MG tablet Take 650 mg by mouth every 6 (six) hours as needed (for pain).     [provider]  amLODipine (NORVASC) 10 MG tablet Take 1 tablet (10 mg total) by mouth daily. 07/30/18   Angiulli, Lavon Paganini, PA-C  aspirin EC 81 MG tablet Take 81 mg by mouth daily.    [provider]  atorvastatin (LIPITOR) 20 MG tablet Take 1 tablet (20 mg total) by mouth at bedtime. 07/30/18   Angiulli, Lavon Paganini, PA-C  BD PEN NEEDLE NANO 2ND GEN 32G X 4 MM MISC See admin instructions. 07/01/19   [provider]  bethanechol (URECHOLINE) 25 MG tablet Take 25 mg by mouth 2 (two) times daily. 08/18/19   [provider]  bisacodyl (DULCOLAX) 5 MG EC tablet Take 5 mg by mouth every 3 (three) days.     [provider]  Blood Glucose Monitoring Suppl ([place]) w/Device KIT USE TO CHECK BLOOD SUGAR AS DIRECTED 01/09/20   [provider]  cephALEXin (KEFLEX) 500 MG capsule Take by mouth 2 (two) times daily. 01/22/20   [provider]  diphenhydrAMINE (BENADRYL) 50 MG tablet Take one 50 mg tablet one hour before the test with the last dose of the  prednisone. 12/31/18   Croitoru, Mihai, MD  docusate sodium (COLACE) 100 MG capsule Take 1 capsule (100 mg total) by mouth 2 (two) times daily. 07/11/18   Cherylann Ratel A, DO  dutasteride (AVODART) 0.5 MG capsule Take 0.5 mg by mouth daily. 03/05/20   [provider]  esomeprazole (NEXIUM) 40 MG capsule Take 30- 60 min before your first and last meals of the day Patient not taking: Reported on 03/19/2020 02/13/19   Tanda Rockers, MD  ezetimibe (ZETIA) 10 MG tablet Take 1 tablet (10 mg total) by mouth daily. 09/09/19   Burtis Junes, NP  gabapentin (NEURONTIN) 300 MG capsule Take 300 mg by mouth 3 (three)  times daily. 08/11/19   [provider]  glimepiride (AMARYL) 2 MG tablet Take 4 mg by mouth daily. 06/17/19   [provider]  glipiZIDE (GLUCOTROL) 5 MG tablet Take 0.5 tablets (2.5 mg total) by mouth 2 (two) times daily before a meal. 07/30/18   Angiulli, Lavon Paganini, PA-C  Insulin Glargine (LANTUS) 100 UNIT/ML Solostar Pen Inject 18 Units into the skin at bedtime. 07/30/18   Angiulli, Lavon Paganini, PA-C  isosorbide mononitrate (IMDUR) 30 MG 24 hr tablet Take 0.5 tablets (15 mg total) by mouth daily. 09/08/19 12/07/19  Burtis Junes, NP  lisinopril-hydrochlorothiazide (ZESTORETIC) 20-12.5 MG tablet Take 1 tablet by mouth every morning. 08/08/19   [provider]  Melatonin 3 MG TABS Take 1 tablet (3 mg total) by mouth at bedtime. 07/30/18   Angiulli, Lavon Paganini, PA-C  Multiple Vitamin (MULTIVITAMIN WITH MINERALS) TABS tablet Take 1 tablet by mouth daily.    [provider]  nitroGLYCERIN (NITROSTAT) 0.4 MG SL tablet Place 1 tablet (0.4 mg total) under the tongue every 5 (five) minutes as needed for chest pain. 02/10/20   Josue Hector, MD  nystatin cream (MYCOSTATIN) Apply topically. 02/20/20   [provider]  [facility] VERIO test strip 2 (two) times daily. 07/15/19   [provider]  pantoprazole (PROTONIX) 40 MG tablet Take 40 mg by mouth 2 (two)  times daily. 02/27/20   [provider]  senna (SENOKOT) 8.6 MG TABS tablet Take 1 tablet (8.6 mg total) by mouth at bedtime as needed for mild constipation. Patient not taking: Reported on 03/19/2020 07/23/18   Geradine Girt, DO  sucralfate (CARAFATE) 1 g tablet Take 1 g by mouth 2 (two) times daily. 08/11/19   [provider]  tamsulosin (FLOMAX) 0.4 MG CAPS capsule Take 1 capsule (0.4 mg total) by mouth daily after supper. 07/30/18   Angiulli, Lavon Paganini, PA-C     Allergies:    Allergies  Allergen Reactions   Dicyclomine Anaphylaxis   Iodinated Diagnostic Agents Other (See Comments)   Ioversol    Oxycodone Hcl Nausea And Vomiting   Ranexa [Ranolazine] Other (See Comments)    Caused weakness (possibly)   Rosuvastatin Calcium Other (See Comments)    Muscle pain   Rosuvastatin Calcium Other (See Comments)    Myalgias Muscle pain   Tramadol Other (See Comments)    Makes patient "spaced out and dizzy"   Iohexol Hives and Itching    Patient is ok with premedication (Benadryl 50 mg by mouth); Onset Date: 12/03/2007  Patient is ok with premedication (Benadryl 50 mg by mouth); Onset Date: 12/03/2007   Statins Other (See Comments)    Muscle pain, but can tolerate Atorvastatin     Social History:   Social History   Socioeconomic History   Marital status: Married    Spouse name: Not on file   Number of children: Not on file   Years of education: Not on file   Highest education level: Not on file  Occupational History   Not on file  Tobacco Use   Smoking status: Never   Smokeless tobacco: Never  Vaping Use   Vaping Use: Never used  Substance and Sexual Activity   Alcohol use: No   Drug use: No   Sexual activity: Not Currently  Other Topics Concern   Not on file  Social History Narrative   Lives in [place] with wife.  Retired Company secretary.  Walks 1.55m daily.  Does not work 2/2 back problems.  Social Determinants of Health   Financial Resource Strain: Not on  file  Food Insecurity: Not on file  Transportation Needs: Not on file  Physical Activity: Not on file  Stress: Not on file  Social Connections: Not on file  Intimate Partner Violence: Not on file    Family History:  No sudden cardiac death The patient's family history includes Coronary artery disease in his brother and sister; Diabetes in his brother; Heart attack in his father; Other in his mother and another family member.    ROS:  Please see the history of present illness.  All other ROS reviewed and negative.     Physical Exam/Data:   Vitals:   11/06/20 1948 11/06/20 1953 11/06/20 1957 11/06/20 2002  BP: (!) 103/50 (!) 99/50 (!) 96/51 (!) 100/56  Pulse: (!) 120 79 79 79  Resp: 15 19 (!) 25 14  Temp:      TempSrc:      SpO2: 99% 100% 100% 100%  Weight:      Height:       No intake or output data in the 24 hours ending 11/06/20 2014 Last 3 Weights 11/06/2020 03/19/2020 02/04/2020  Weight (lbs) 158 lb 156 lb 12.8 oz 153 lb 4 oz  Weight (kg) 71.668 kg 71.124 kg 69.514 kg     Body mass index is 24.75 kg/m.  General:  Moderate distress HEENT: normal Neck: mildly elevated JVD Vascular: No carotid bruits; Distal pulses 2+ bilaterally   Cardiac:  normal S1, S2; RRR; Lungs: Poor airflow; crackles at bases Abd: soft, nontender, no hepatomegaly  Ext: no edema Musculoskeletal:  No deformities, BUE and BLE strength normal and equal Skin: warm and dry  Neuro:  CNs 2-12 intact, no focal abnormalities noted Psych:  Normal affect    EKG:  The ECG that was done was personally reviewed and demonstrates STEMI   Laboratory Data:  High Sensitivity Troponin:   Recent Labs  Lab 11/06/20 1801  TROPONINIHS 24*      Chemistry Recent Labs  Lab 11/06/20 1801  NA 137  K 3.6  CL 100  CO2 22  GLUCOSE 298*  BUN 18  CREATININE 0.95  CALCIUM 9.6  GFRNONAA >60  ANIONGAP 15    Recent Labs  Lab 11/06/20 1801  PROT 7.9  ALBUMIN 3.8  AST 23  ALT 22  ALKPHOS 73  BILITOT 0.9    Lipids  Recent Labs  Lab 11/06/20 1801  CHOL 164  TRIG 123  HDL 50  LDLCALC 89  CHOLHDL 3.3   Hematology Recent Labs  Lab 11/06/20 1801  WBC 13.4*  RBC 4.57  HGB 14.3  HCT 42.4  MCV 92.8  MCH 31.3  MCHC 33.7  RDW 13.2  PLT 238   Thyroid No results for input(s): TSH, FREET4 in the last 168 hours. BNPNo results for input(s): BNP, PROBNP in the last 168 hours.  DDimer No results for input(s): DDIMER in the last 168 hours.   Radiology/Studies:  CARDIAC CATHETERIZATION  Result Date: 11/06/2020   Prox LAD to Mid LAD lesion is 85% stenosed.   Prox LAD lesion is 100% stenosed.   Dist Cx lesion is 100% stenosed.   Prox RCA to Dist RCA lesion is 100% stenosed.   Ost 1st Diag lesion is 70% stenosed.   Ost 2nd Diag to 2nd Diag lesion is 70% stenosed.   Ost 3rd Mrg lesion is 100% stenosed.   Mid Graft to Dist Graft lesion before 1st Mrg  is 100% stenosed.  Ost LAD to Prox LAD lesion is 100% stenosed.   Ost Cx to Dist Cx lesion is 100% stenosed.   A drug-eluting stent was successfully placed using a STENT ONYX FRONTIER 3.5X30.   Post intervention, there is a 0% residual stenosis.   SVG and is large.   There is mild left ventricular systolic dysfunction.   LV end diastolic pressure is severely elevated.   The left ventricular ejection fraction is 45-50% by visual estimate. 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 Patent LIMA to the LAD Patent SVG to the PDA Occluded SVG sequentially to OM1 and OM2 Mild LV dysfunction. EF 45%. Elevated LVEDP 30 mm Hg Successful PCI of the SVG to OM1 and OM2 with aspiration thrombectomy and DES x 1 Plan: DAPT for one year. Continue pressor support- wean as tolerated. Angiomax discontinued- plan to remove sheath in 2 hours.   DG Chest Port 1 View  Result Date: 11/06/2020 CLINICAL DATA:  chest pain EXAM: PORTABLE CHEST 1 VIEW COMPARISON:  February 13, 2020 FINDINGS: The cardiomediastinal silhouette is unchanged in contour.Status post median  sternotomy and CABG. Low lung volumes. No pleural effusion. No pneumothorax. LEFT basilar linear opacities. Surgical clips project over the upper abdomen. IMPRESSION: LEFT basilar linear opacities, likely atelectasis. Low lung volumes. Electronically Signed   By: Valentino Saxon M.D.   On: 11/06/2020 18:24     Assessment and Plan:   # STEMI:  -LHC Occluded SVG sequentially to OM1 and OM2  -Patent LIMA to the LAD Patent SVG to the PDA with LVEDP 30 -Successful PCI of the SVG to OM1 and OM2 with aspiration thrombectomy and DES x 1 Plan: DAPT for one year. -Required levophed in LHC; wean as tolerated -Echo in AM -C/w aspirin, Brilinta and statins -Hold BB for now  # T2DM: Insulin per protocol # HLD: Lipitor # BPH: Flomax # GERD: PPI   For questions or updates, please contact [facility] Please consult [email or web address] for contact info under     Signed, Jaci Lazier, MD  11/06/2020 8:14 PM

## 2020-11-06 NOTE — ED Provider Notes (Signed)
[facility] EMERGENCY DEPARTMENT Provider Note   CSN: [id number] Arrival date & time: 11/06/20  1737     History Chief Complaint  Patient presents with   Chest Pain    Ricardo Le is a 80 y.o. male.  Patient brought in by [facility].  Patient with onset of anterior chest pain radiating to the left arm left shoulder and a little bit to the left shoulder blade area about 7 hours ago.  Patient followed by cardiology here at [facility] heart care.  Patient known to have peripheral vascular disease and coronary artery disease.  He had a CABG in 2009 his last cath was in April 2020 he had patent SVG to PDA patent SVG to distal circumflex patent LIMA to LAD with no targets at that time for PCI.  Patient states that this is the worst chest pain he has had he occasionally gets some chest pain.  EMS stated that he had a brief period of V. fib on the way in and had loss of consciousness.  They got pads placed but he came back prior to them being able to shock him.  They also were thinking of giving him atropine because his heart rate was very slow.  But that also came back on its own.  Patient denies any lumbar back pain or abdominal pain.  Patient is not on any blood thinners.  Oval Linsey tried to call it as a code STEMI in the field.  But that EKG was inconclusive.  When patient got here we repeated another twelve-lead.  Concerning for possible posterior MI had ST segment depression V2 V3 and V4 and ST elevation in aVR.  We tried also has ST segment depression in lead I and lead II.  Tried posterior leads.  That is somewhat inconclusive.  The patient did have pacer pads on so it made it difficult.  Patient states the pain is still 10 out of 10.  EMS in the field gave him fentanyl gave him aspirin he did not receive any nitroglycerin.  This has been associated with some nausea.  No leg swelling.      Past Medical History:  Diagnosis Date   CAD (coronary artery disease)    a. 04/2007 s/p  CABG x 4  - LIMA->LAD, VG->OM1->Om2, VG->PDA;  b. 12/2008 & 06/2010 Caths - Native multivessel dzs with 4/4 patent grafts. c. cath 03/09/2015 patent grafts, unchanged from prior cath.   Cancer [facility])    pancreatic  March 2011   Carotid bruit    a. 07/2010 U/S- 0-39% bilat ICA stenosis   Chest pain    Noncardiac probably related to reflux   DDD (degenerative disc disease)    several surgeries   Fracture acetabulum-closed ([place]) 07/08/2018   GERD (gastroesophageal reflux disease)    HNP (herniated nucleus pulposus), lumbar    a. L2-3, s/p laminotomy, microdiskectomy 02/2009   HOH (hard of hearing)    uses amplifiers    Nephrolithiasis    PAD (peripheral artery disease) ([place]) 04/2014   s/p L Common Iliac Stent   Palpitations    Pancreatic mass    a. 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   Pneumonia    hx of  pna   Pure hypercholesterolemia    statin intolerance   Type II or unspecified type diabetes mellitus without mention of complication, not stated as uncontrolled    insulin dependent   Unspecified essential hypertension     Patient Active Problem  List   Diagnosis Date Noted   Epigastric pain 04/06/2020   Personal history of coronary artery disease 02/13/2020   Postural kyphosis 09/10/2019   Chronic mesenteric insufficiency (HCC) 05/16/2019   Chronic idiopathic constipation 04/23/2019   Gastroesophageal reflux disease without esophagitis 04/23/2019   Pulmonary embolism and infarction ([place]) 10/19/2018   DVT (deep venous thrombosis) ([place]) 10/19/2018   Acute deep vein thrombosis (DVT) of left peroneal vein (HCC)    Pulmonary embolism (HCC)    DOE (dyspnea on exertion) 10/16/2018   Traumatic intraventricular hemorrhage ([place]) 07/23/2018   Intraventricular hemorrhage ([place]) 07/20/2018   Fracture of left acetabulum ([place]) 07/09/2018   Elevated troponin    Coronary artery disease involving native coronary artery of native heart with unstable angina pectoris ([place])     Chest pain 02/20/2017   Precordial chest pain    Angina pectoris ([place]) 02/19/2017   Atherosclerotic heart disease of native coronary artery with unstable angina pectoris ([place]) 03/08/2015   Hyperlipidemia 01/26/2015   Cervical spondylosis without myelopathy 07/28/2014   Cervical spondylosis 07/28/2014   Tension-type headache 05/28/2014   Constipation 05/19/2014   Pain 05/18/2014   Neck pain 03/25/2014   PAD (peripheral artery disease) ([place]) 03/20/2012   Unstable angina ([employer]) 05/05/2011   DDD (degenerative disc disease)    HNP (herniated nucleus pulposus), lumbar    Pancreatic mass    Nephrolithiasis    Carotid bruit    S/P CABG x 4 03/08/2011   Bruit 07/06/2010   NONSPECIFIC ABN FINDING RAD & OTH EXAM GI TRACT 06/09/2009   Neuroendocrine carcinoma of pancreas ([place]) 06/02/2009   NAUSEA ALONE 05/28/2009   CHANGE IN BOWELS 05/28/2009   Elevated lipids 05/27/2009   Abdominal pain, generalized 05/27/2009   Type 2 diabetes mellitus with vascular disease ([place]) 12/16/2008   Essential hypertension 12/16/2008   BACK PAIN 12/16/2008   Palpitations 12/16/2008    Past Surgical History:  Procedure Laterality Date   ABDOMINAL AORTAGRAM N/A 05/13/2014   Procedure: ABDOMINAL [name];  Surgeon: Wellington Hampshire, MD;  Location: [facility] CATH LAB;  Service: Cardiovascular;  Laterality: N/A;   ANTERIOR CERVICAL DECOMP/DISCECTOMY FUSION N/A 07/28/2014   Procedure: ANTERIOR CERVICAL DECOMPRESSION/DISCECTOMY FUSION CERVICAL FOUR-FIVE CERVICAL FIVE-SIX ;  Surgeon: Earnie Larsson, MD;  Location: [facility] NEURO ORS;  Service: Neurosurgery;  Laterality: N/A;   APPENDECTOMY     arthroscopy right knee     BACK SURGERY     CARDIAC CATHETERIZATION N/A 03/09/2015   Procedure: Left Heart Cath and Cors/Grafts Angiography;  Surgeon: [name] Sine, MD;  Location: [facility] CV LAB;  Service: Cardiovascular;  Laterality: N/A;   CHOLECYSTECTOMY     CORONARY ARTERY BYPASS GRAFT     X 4 2009 (LIMA to LAD, SVG to first  cicumflex marginal branch with sequential SVG to second cicumflex marginal branch  and saphenous vein to posterior descending coronary artery with endoscopic,vein harvest rt. lower exttremity by Dr.Owen on March 31,2009   CYST REMOVED     FROM SPINE   CYSTOURETHROSCOPY     right retrograde pyelogram,manipulate stone in the renal pelvis, rt. double-j catheter.   EYE SURGERY     KNEE SURGERY     rt knee   LEFT HEART CATH AND CORS/GRAFTS ANGIOGRAPHY N/A 02/20/2017   Procedure: LEFT HEART CATH AND CORS/GRAFTS ANGIOGRAPHY;  Surgeon: Leonie Man, MD;  Location: [facility] CV LAB;  Service: Cardiovascular;  Laterality: N/A;   LEFT HEART CATH AND CORS/GRAFTS ANGIOGRAPHY N/A 05/28/2018   Procedure: LEFT HEART CATH AND CORS/GRAFTS  ANGIOGRAPHY;  Surgeon: Burnell Blanks, MD;  Location: [facility] CV LAB;  Service: Cardiovascular;  Laterality: N/A;   NEPHROLITHOTOMY     PERCUTANEOUS   PARS PLANA VITRECTOMY     lt. eye,retinal photocoagulation lt. eye, membrane peel lt. eye.   PERCUTANEOUS STENT INTERVENTION Left 05/13/2014   Procedure: PERCUTANEOUS STENT INTERVENTION;  Surgeon: Wellington Hampshire, MD;  Location: [facility] CATH LAB;  Service: Cardiovascular;  Laterality: Left;  COMMON ILLIAC   re-exploratin of laminectomy  05/2008   (RT L2-3) WITH REDO MICRODISKECTOMY       Family History  Problem Relation Age of Onset   Heart attack Father        died of MI @ 34   Diabetes Brother        type 2   Other Mother        died @ 36   Coronary artery disease Sister        alive   Other Other        no fh of colon cancer   Coronary artery disease Brother        s/p CABG.  Alive @ 67    Social History   Tobacco Use   Smoking status: Never   Smokeless tobacco: Never  Vaping Use   Vaping Use: Never used  Substance Use Topics   Alcohol use: No   Drug use: No    Home Medications Prior to Admission medications   Medication Sig Start Date End Date Taking? Authorizing Provider   acetaminophen (TYLENOL) 325 MG tablet Take 650 mg by mouth every 6 (six) hours as needed (for pain).     [provider]  amLODipine (NORVASC) 10 MG tablet Take 1 tablet (10 mg total) by mouth daily. 07/30/18   Angiulli, Lavon Paganini, PA-C  aspirin EC 81 MG tablet Take 81 mg by mouth daily.    [provider]  atorvastatin (LIPITOR) 20 MG tablet Take 1 tablet (20 mg total) by mouth at bedtime. 07/30/18   Angiulli, Lavon Paganini, PA-C  BD PEN NEEDLE NANO 2ND GEN 32G X 4 MM MISC See admin instructions. 07/01/19   [provider]  bethanechol (URECHOLINE) 25 MG tablet Take 25 mg by mouth 2 (two) times daily. 08/18/19   [provider]  bisacodyl (DULCOLAX) 5 MG EC tablet Take 5 mg by mouth every 3 (three) days.     [provider]  Blood Glucose Monitoring Suppl ([place]) w/Device KIT USE TO CHECK BLOOD SUGAR AS DIRECTED 01/09/20   [provider]  cephALEXin (KEFLEX) 500 MG capsule Take by mouth 2 (two) times daily. 01/22/20   [provider]  diphenhydrAMINE (BENADRYL) 50 MG tablet Take one 50 mg tablet one hour before the test with the last dose of the prednisone. 12/31/18   Croitoru, Mihai, MD  docusate sodium (COLACE) 100 MG capsule Take 1 capsule (100 mg total) by mouth 2 (two) times daily. 07/11/18   Cherylann Ratel A, DO  dutasteride (AVODART) 0.5 MG capsule Take 0.5 mg by mouth daily. 03/05/20   [provider]  esomeprazole (NEXIUM) 40 MG capsule Take 30- 60 min before your first and last meals of the day Patient not taking: Reported on 03/19/2020 02/13/19   Tanda Rockers, MD  ezetimibe (ZETIA) 10 MG tablet Take 1 tablet (10 mg total) by mouth daily. 09/09/19   Burtis Junes, NP  gabapentin (NEURONTIN) 300 MG capsule Take 300 mg by mouth 3 (three) times  daily. 08/11/19   [provider]  glimepiride (AMARYL) 2 MG tablet Take 4 mg by mouth daily. 06/17/19   [provider]  glipiZIDE (GLUCOTROL) 5 MG  tablet Take 0.5 tablets (2.5 mg total) by mouth 2 (two) times daily before a meal. 07/30/18   Angiulli, Lavon Paganini, PA-C  Insulin Glargine (LANTUS) 100 UNIT/ML Solostar Pen Inject 18 Units into the skin at bedtime. 07/30/18   Angiulli, Lavon Paganini, PA-C  isosorbide mononitrate (IMDUR) 30 MG 24 hr tablet Take 0.5 tablets (15 mg total) by mouth daily. 09/08/19 12/07/19  Burtis Junes, NP  lisinopril-hydrochlorothiazide (ZESTORETIC) 20-12.5 MG tablet Take 1 tablet by mouth every morning. 08/08/19   [provider]  Melatonin 3 MG TABS Take 1 tablet (3 mg total) by mouth at bedtime. 07/30/18   Angiulli, Lavon Paganini, PA-C  Multiple Vitamin (MULTIVITAMIN WITH MINERALS) TABS tablet Take 1 tablet by mouth daily.    [provider]  nitroGLYCERIN (NITROSTAT) 0.4 MG SL tablet Place 1 tablet (0.4 mg total) under the tongue every 5 (five) minutes as needed for chest pain. 02/10/20   Josue Hector, MD  nystatin cream (MYCOSTATIN) Apply topically. 02/20/20   [provider]  [facility] VERIO test strip 2 (two) times daily. 07/15/19   [provider]  pantoprazole (PROTONIX) 40 MG tablet Take 40 mg by mouth 2 (two) times daily. 02/27/20   [provider]  senna (SENOKOT) 8.6 MG TABS tablet Take 1 tablet (8.6 mg total) by mouth at bedtime as needed for mild constipation. Patient not taking: Reported on 03/19/2020 07/23/18   Geradine Girt, DO  sucralfate (CARAFATE) 1 g tablet Take 1 g by mouth 2 (two) times daily. 08/11/19   [provider]  tamsulosin (FLOMAX) 0.4 MG CAPS capsule Take 1 capsule (0.4 mg total) by mouth daily after supper. 07/30/18   Angiulli, Lavon Paganini, PA-C    Allergies    Dicyclomine, Iodinated diagnostic agents, Ioversol, Oxycodone hcl, Ranexa [ranolazine], Rosuvastatin calcium, Rosuvastatin calcium, Tramadol, Iohexol, and Statins  Review of Systems   Review of Systems  Constitutional:  Negative for chills and fever.  HENT:  Positive for hearing loss.  Negative for ear pain and sore throat.   Eyes:  Negative for pain and visual disturbance.  Respiratory:  Negative for cough and shortness of breath.   Cardiovascular:  Positive for chest pain. Negative for palpitations.  Gastrointestinal:  Positive for nausea. Negative for abdominal pain and vomiting.  Genitourinary:  Negative for dysuria and hematuria.  Musculoskeletal:  Negative for arthralgias and back pain.  Skin:  Negative for color change and rash.  Neurological:  Negative for seizures and syncope.  All other systems reviewed and are negative.  Physical Exam Updated Vital Signs BP (!) 107/59   Pulse 60   Temp (!) 97.5 F (36.4 C) (Oral)   Resp 20   Ht 1.702 m (_0 )   Wt 71.7 kg   SpO2 99%   BMI 24.75 kg/m   Physical Exam Vitals and nursing note reviewed.  Constitutional:      Appearance: Normal appearance. He is well-developed.  HENT:     Head: Normocephalic and atraumatic.  Eyes:     Extraocular Movements: Extraocular movements intact.     Conjunctiva/sclera: Conjunctivae normal.     Pupils: Pupils are equal, round, and reactive to light.  Cardiovascular:     Rate and Rhythm: Normal rate and regular rhythm.     Heart sounds: No murmur heard. Pulmonary:  Effort: Pulmonary effort is normal. No respiratory distress.     Breath sounds: Normal breath sounds.  Chest:     Chest wall: No tenderness.  Abdominal:     Palpations: Abdomen is soft.     Tenderness: There is no abdominal tenderness.  Musculoskeletal:        General: No swelling. Normal range of motion.     Cervical back: Normal range of motion and neck supple.  Skin:    General: Skin is warm and dry.     Capillary Refill: Capillary refill takes 2 to 3 seconds.  Neurological:     General: No focal deficit present.     Mental Status: He is alert.     Comments: Patient a little drowsy probably from the fentanyl.  But he will answer questions.  He is hard of hearing    ED Results / Procedures /  Treatments   Labs (all labs ordered are listed, but only abnormal results are displayed) Labs Reviewed  CBC WITH DIFFERENTIAL/PLATELET - Abnormal; Notable for the following components:      Result Value   WBC 13.4 (*)    Neutro Abs 9.2 (*)    Monocytes Absolute 1.4 (*)    Abs Immature Granulocytes 0.08 (*)    All other components within normal limits  RESP PANEL BY RT-PCR (FLU A&B, COVID) ARPGX2  HEMOGLOBIN A1C  PROTIME-INR  APTT  COMPREHENSIVE METABOLIC PANEL  LIPID PANEL  TROPONIN I (HIGH SENSITIVITY)    EKG EKG Interpretation  Date/Time:  Saturday November 06 2020 17:50:09 EDT Ventricular Rate:  68 PR Interval:  202 QRS Duration: 113 QT Interval:  398 QTC Calculation: 424 R Axis:   -82 Text Interpretation: Sinus rhythm Atrial premature complex Incomplete right bundle branch block Inferior infarct, old Anteroseptal infarct, age indeterminate Lateral leads are also involved Posterior leads EKG Confirmed by Fredia Sorrow [phone]) on 11/06/2020 5:53:32 PM  Radiology DG Chest Port 1 View  Result Date: 11/06/2020 CLINICAL DATA:  chest pain EXAM: PORTABLE CHEST 1 VIEW COMPARISON:  February 13, 2020 FINDINGS: The cardiomediastinal silhouette is unchanged in contour.Status post median sternotomy and CABG. Low lung volumes. No pleural effusion. No pneumothorax. LEFT basilar linear opacities. Surgical clips project over the upper abdomen. IMPRESSION: LEFT basilar linear opacities, likely atelectasis. Low lung volumes. Electronically Signed   By: Valentino Saxon M.D.   On: 11/06/2020 18:24    Procedures Procedures   Medications Ordered in ED Medications  0.9 %  sodium chloride infusion ( Intravenous New Bag/Given 11/06/20 1808)  aspirin chewable tablet 324 mg (324 mg Oral Not Given 11/06/20 1801)  nitroGLYCERIN 50 mg in dextrose 5 % 250 mL (0.2 mg/mL) infusion (has no administration in time range)  heparin injection 4,000 Units (4,000 Units Intravenous Given 11/06/20 1823)     ED Course  I have reviewed the triage vital signs and the nursing notes.  Pertinent labs & imaging results that were available during my care of the patient were reviewed by me and considered in my medical decision making (see chart for details).    MDM Rules/Calculators/A&P                         CRITICAL CARE Performed by: Fredia Sorrow Total critical care time: 60 minutes Critical care time was exclusive of separately billable procedures and treating other patients. Critical care was necessary to treat or prevent imminent or life-threatening deterioration. Critical care was time spent personally by me on  the following activities: development of treatment plan with patient and/or surrogate as well as nursing, discussions with consultants, evaluation of patient's response to treatment, examination of patient, obtaining history from patient or surrogate, ordering and performing treatments and interventions, ordering and review of laboratory studies, ordering and review of radiographic studies, pulse oximetry and re-evaluation of patient's condition.   Code STEMI reactivated based on EKG here.  Discussed it with Dr.Jordan we went over the EKG markedly changed from his EKG in 2020.  May represent a posterior STEMI.  But not exactly clear but based on the degree of pain duration of the pain they are going to take patient to the Cath Lab.  I did activate STEMI order set which includes heparin.  Patient not on a blood thinner.  And we will start nitroglycerin at a low dose and they can titrate it.  For the pain.  Chest x-ray reviewed by me poor inspiration.  But no obvious abnormalities.  Clinically did discuss with Dr. [place] based on the pain kind of going to the left shoulder blade where there was concern may be for dissection but clinically I think this is not acting like that.  Low patient is a vasculopath.  He said based on his prior vessels he certainly is a set up for a posterior  MI.   Final Clinical Impression(s) / ED Diagnoses Final diagnoses:  ST elevation myocardial infarction (STEMI), unspecified artery [facility])    Rx / DC Orders ED Discharge Orders     None        Fredia Sorrow, MD 11/06/20 1827

## 2020-11-06 NOTE — ED Notes (Signed)
X-ray at bedside

## 2020-11-07 ENCOUNTER — Other Ambulatory Visit: Payer: Self-pay

## 2020-11-07 ENCOUNTER — Inpatient Hospital Stay (HOSPITAL_COMMUNITY): Payer: Medicare Other

## 2020-11-07 DIAGNOSIS — I2129 ST elevation (STEMI) myocardial infarction involving other sites: Secondary | ICD-10-CM

## 2020-11-07 LAB — ECHOCARDIOGRAM COMPLETE
Area-P 1/2: 3.46 cm2
Height: 67 in
S' Lateral: 3.2 cm
Weight: 2578.5 oz

## 2020-11-07 LAB — BASIC METABOLIC PANEL
Anion gap: 12 (ref 5–15)
Anion gap: 11 (ref 5–15)
BUN: 23 mg/dL (ref 8–23)
BUN: 28 mg/dL — ABNORMAL HIGH (ref 8–23)
CO2: 24 mmol/L (ref 22–32)
CO2: 28 mmol/L (ref 22–32)
Calcium: 8.9 mg/dL (ref 8.9–10.3)
Calcium: 9.2 mg/dL (ref 8.9–10.3)
Chloride: 99 mmol/L (ref 98–111)
Chloride: 98 mmol/L (ref 98–111)
Creatinine, Ser: 1.41 mg/dL — ABNORMAL HIGH (ref 0.61–1.24)
Creatinine, Ser: 1.2 mg/dL (ref 0.61–1.24)
GFR, Estimated: 51 mL/min — ABNORMAL LOW (ref >60)
GFR, Estimated: >60 mL/min (ref >60)
Glucose, Bld: 357 mg/dL — ABNORMAL HIGH (ref 70–99)
Glucose, Bld: 169 mg/dL — ABNORMAL HIGH (ref 70–99)
Potassium: 4.4 mmol/L (ref 3.5–5.1)
Potassium: 4.1 mmol/L (ref 3.5–5.1)
Sodium: 135 mmol/L (ref 135–145)
Sodium: 137 mmol/L (ref 135–145)

## 2020-11-07 LAB — GLUCOSE, CAPILLARY
Glucose-Capillary: 334 mg/dL — ABNORMAL HIGH (ref 70–99)
Glucose-Capillary: 282 mg/dL — ABNORMAL HIGH (ref 70–99)
Glucose-Capillary: 165 mg/dL — ABNORMAL HIGH (ref 70–99)
Glucose-Capillary: 89 mg/dL (ref 70–99)
Glucose-Capillary: 200 mg/dL — ABNORMAL HIGH (ref 70–99)

## 2020-11-07 LAB — CBC
HCT: 41.7 % (ref 39.0–52.0)
Hemoglobin: 14.3 g/dL (ref 13.0–17.0)
MCH: 31.3 pg (ref 26.0–34.0)
MCHC: 34.3 g/dL (ref 30.0–36.0)
MCV: 91.2 fL (ref 80.0–100.0)
Platelets: 232 10*3/uL (ref 150–400)
RBC: 4.57 MIL/uL (ref 4.22–5.81)
RDW: 13.4 % (ref 11.5–15.5)
WBC: 15.5 10*3/uL — ABNORMAL HIGH (ref 4.0–10.5)
nRBC: 0 % (ref 0.0–0.2)

## 2020-11-07 LAB — CREATININE, SERUM
Creatinine, Ser: 1.21 mg/dL (ref 0.61–1.24)
GFR, Estimated: >60 mL/min (ref >60)

## 2020-11-07 LAB — MAGNESIUM: Magnesium: 1.8 mg/dL (ref 1.7–2.4)

## 2020-11-07 LAB — TROPONIN I (HIGH SENSITIVITY): Troponin I (High Sensitivity): 18968 ng/L (ref <18)

## 2020-11-07 LAB — MRSA NEXT GEN BY PCR, NASAL: MRSA by PCR Next Gen: DETECTED — AB

## 2020-11-07 MED ORDER — FUROSEMIDE 10 MG/ML IJ SOLN
INTRAMUSCULAR | Status: AC
  Filled 2020-11-07: qty 4

## 2020-11-07 MED ORDER — FUROSEMIDE 10 MG/ML IJ SOLN
40.0000 mg | INTRAMUSCULAR | Status: AC
  Administered 2020-11-07: 40 mg via INTRAVENOUS

## 2020-11-07 MED ORDER — MUPIROCIN 2 % EX OINT
1.0000 "application " | TOPICAL_OINTMENT | Freq: Two times a day (BID) | CUTANEOUS | Status: AC
  Administered 2020-11-07 – 2020-11-11 (×10): 1 via NASAL
  Filled 2020-11-07: qty 22

## 2020-11-07 MED ORDER — INSULIN ASPART 100 UNIT/ML IJ SOLN
3.0000 [IU] | Freq: Three times a day (TID) | INTRAMUSCULAR | Status: DC
  Administered 2020-11-07 – 2020-11-08 (×4): 3 [IU] via SUBCUTANEOUS

## 2020-11-07 MED ORDER — ATORVASTATIN CALCIUM 40 MG PO TABS
40.0000 mg | ORAL_TABLET | Freq: Every day | ORAL | Status: DC
  Administered 2020-11-07 – 2020-11-11 (×5): 40 mg via ORAL
  Filled 2020-11-07 (×5): qty 1

## 2020-11-07 MED ORDER — METOPROLOL TARTRATE 25 MG PO TABS: 25.0000 mg | ORAL_TABLET | Freq: Two times a day (BID) | ORAL | Status: DC

## 2020-11-07 MED ORDER — ORAL CARE MOUTH RINSE
15.0000 mL | Freq: Two times a day (BID) | OROMUCOSAL | Status: DC
  Administered 2020-11-07 – 2020-11-11 (×10): 15 mL via OROMUCOSAL

## 2020-11-07 MED ORDER — IPRATROPIUM-ALBUTEROL 0.5-2.5 (3) MG/3ML IN SOLN: 3.0000 mL | Freq: Four times a day (QID) | RESPIRATORY_TRACT | Status: DC | PRN

## 2020-11-07 MED ORDER — METOPROLOL TARTRATE 12.5 MG HALF TABLET
12.5000 mg | ORAL_TABLET | Freq: Two times a day (BID) | ORAL | Status: AC
Start: 2020-11-07 — End: 2020-11-09
  Administered 2020-11-07 – 2020-11-09 (×6): 12.5 mg via ORAL
  Filled 2020-11-07 (×6): qty 1

## 2020-11-07 MED ORDER — POLYETHYLENE GLYCOL 3350 17 G PO PACK
17.0000 g | PACK | Freq: Every day | ORAL | Status: DC
  Administered 2020-11-07 – 2020-11-11 (×5): 17 g via ORAL
  Filled 2020-11-07 (×5): qty 1

## 2020-11-07 MED ORDER — ALUM & MAG HYDROXIDE-SIMETH 200-200-20 MG/5ML PO SUSP
30.0000 mL | Freq: Once | ORAL | Status: AC
  Administered 2020-11-07: 30 mL via ORAL
  Filled 2020-11-07: qty 30

## 2020-11-07 MED ORDER — OXYCODONE HCL 5 MG PO TABS
2.5000 mg | ORAL_TABLET | Freq: Once | ORAL | Status: AC
  Administered 2020-11-07: 2.5 mg via ORAL
  Filled 2020-11-07: qty 1

## 2020-11-07 MED ORDER — CHLORHEXIDINE GLUCONATE CLOTH 2 % EX PADS
6.0000 | MEDICATED_PAD | Freq: Every day | CUTANEOUS | Status: DC
  Administered 2020-11-07 – 2020-11-11 (×4): 6 via TOPICAL

## 2020-11-07 MED ORDER — GUAIFENESIN-DM 100-10 MG/5ML PO SYRP: 5.0000 mL | ORAL_SOLUTION | ORAL | Status: DC | PRN

## 2020-11-07 MED ORDER — INSULIN ASPART 100 UNIT/ML IJ SOLN
0.0000 [IU] | Freq: Three times a day (TID) | INTRAMUSCULAR | Status: DC
  Administered 2020-11-07: 8 [IU] via SUBCUTANEOUS
  Administered 2020-11-07: 3 [IU] via SUBCUTANEOUS
  Administered 2020-11-08: 11 [IU] via SUBCUTANEOUS

## 2020-11-07 MED ORDER — IPRATROPIUM-ALBUTEROL 0.5-2.5 (3) MG/3ML IN SOLN
RESPIRATORY_TRACT | Status: AC
  Administered 2020-11-07: 3 mL
  Filled 2020-11-07: qty 3

## 2020-11-07 NOTE — Progress Notes (Addendum)
Progress Note  Patient Name: Ricardo Le Date of Encounter: 11/07/2020  [facility] Cardiologist: Jenkins Rouge, MD   Subjective   Overall feeling fairly well this morning.  He did tell me once again about his postprandial heart pounding that has been going on for quite some time.  Some abdominal discomfort as well.  This has been worked up fairly extensively.  Inpatient Medications    Scheduled Meds:  aspirin EC  81 mg Oral Daily   bethanechol  25 mg Oral BID   Chlorhexidine Gluconate Cloth  6 each Topical Q0600   dutasteride  0.5 mg Oral Daily   ezetimibe  10 mg Oral Daily   gabapentin  300 mg Oral TID   glimepiride  4 mg Oral Daily   heparin  5,000 Units Subcutaneous Q8H   insulin aspart  0-15 Units Subcutaneous TID WC   insulin aspart  3 Units Subcutaneous TID WC   insulin glargine-yfgn  18 Units Subcutaneous QHS   mouth rinse  15 mL Mouth Rinse BID   mupirocin ointment  1 application Nasal BID   pantoprazole  40 mg Oral Daily   sodium chloride flush  3 mL Intravenous Q12H   tamsulosin  0.4 mg Oral QPC supper   ticagrelor  90 mg Oral BID   Continuous Infusions:  sodium chloride Stopped (11/06/20 1947)   sodium chloride     sodium chloride 10 mL/hr at 11/07/20 0700   norepinephrine (LEVOPHED) Adult infusion Stopped (11/07/20 0034)   PRN Meds: sodium chloride, acetaminophen, ondansetron (ZOFRAN) IV, sodium chloride flush   Vital Signs    Vitals:   11/07/20 0600 11/07/20 0700 11/07/20 0718 11/07/20 0800  BP: 100/78 125/68  99/71  Pulse: 80 80  76  Resp: (!) 38 (!) 34  (!) 39  Temp:   98.9 F (37.2 C)   TempSrc:   Axillary   SpO2: 98% 94%  94%  Weight: 73.1 kg     Height:        Intake/Output Summary (Last 24 hours) at 11/07/2020 0856 Last data filed at 11/07/2020 0700 Gross per 24 hour  Intake 2840.74 ml  Output 400 ml  Net 2440.74 ml   Last 3 Weights 11/07/2020 11/06/2020 03/19/2020  Weight (lbs) 161 lb 2.5 oz 158 lb 156 lb 12.8 oz  Weight (kg)  73.1 kg 71.668 kg 71.124 kg      Telemetry    Sinus rhythm with occasional triplets, PVCs- Personally Reviewed  ECG    Original EKG shows posterior STEMI- Personally Reviewed  Physical Exam   GEN: No acute distress.   Neck: No JVD Cardiac: RRR, no murmurs, rubs, or gallops.  Respiratory: Clear to auscultation bilaterally. GI: Soft, nontender, non-distended  MS: No edema; No deformity. Neuro:  Nonfocal  Psych: Normal affect   Labs    High Sensitivity Troponin:   Recent Labs  Lab 11/06/20 1801 11/06/20 2313  TROPONINIHS 24* 18,968*     Chemistry Recent Labs  Lab 11/06/20 1801 11/06/20 2313 11/07/20 0355  NA 137  --  135  K 3.6  --  4.4  CL 100  --  99  CO2 22  --  24  GLUCOSE 298*  --  357*  BUN 18  --  23  CREATININE 0.95 1.21 1.41*  CALCIUM 9.6  --  8.9  PROT 7.9  --   --   ALBUMIN 3.8  --   --   AST 23  --   --  ALT 22  --   --   ALKPHOS 73  --   --   BILITOT 0.9  --   --   GFRNONAA >60 >60 51*  ANIONGAP 15  --  12    Lipids  Recent Labs  Lab 11/06/20 1801  CHOL 164  TRIG 123  HDL 50  LDLCALC 89  CHOLHDL 3.3    Hematology Recent Labs  Lab 11/06/20 1801 11/06/20 2313 11/07/20 0355  WBC 13.4* 16.7* 15.5*  RBC 4.57 4.27 4.57  HGB 14.3 13.6 14.3  HCT 42.4 38.9* 41.7  MCV 92.8 91.1 91.2  MCH 31.3 31.9 31.3  MCHC 33.7 35.0 34.3  RDW 13.2 13.3 13.4  PLT 238 243 232   Thyroid No results for input(s): TSH, FREET4 in the last 168 hours.  BNPNo results for input(s): BNP, PROBNP in the last 168 hours.  DDimer No results for input(s): DDIMER in the last 168 hours.   Radiology    CARDIAC CATHETERIZATION  Result Date: 11/06/2020   Prox LAD to Mid LAD lesion is 85% stenosed.   Prox LAD lesion is 100% stenosed.   Dist Cx lesion is 100% stenosed.   Prox RCA to Dist RCA lesion is 100% stenosed.   Ost 1st Diag lesion is 70% stenosed.   Ost 2nd Diag to 2nd Diag lesion is 70% stenosed.   Ost 3rd Mrg lesion is 100% stenosed.   Mid Graft to Dist Graft  lesion before 1st Mrg  is 100% stenosed.   Ost LAD to Prox LAD lesion is 100% stenosed.   Ost Cx to Dist Cx lesion is 100% stenosed.   A drug-eluting stent was successfully placed using a STENT ONYX FRONTIER 3.5X30.   Post intervention, there is a 0% residual stenosis.   SVG and is large.   There is mild left ventricular systolic dysfunction.   LV end diastolic pressure is severely elevated.   The left ventricular ejection fraction is 45-50% by visual estimate. 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 Patent LIMA to the LAD Patent SVG to the PDA Occluded SVG sequentially to OM1 and OM2 Mild LV dysfunction. EF 45%. Elevated LVEDP 30 mm Hg Successful PCI of the SVG to OM1 and OM2 with aspiration thrombectomy and DES x 1 Plan: DAPT for one year. Continue pressor support- wean as tolerated. Angiomax discontinued- plan to remove sheath in 2 hours.   DG Chest Port 1 View  Result Date: 11/06/2020 CLINICAL DATA:  chest pain EXAM: PORTABLE CHEST 1 VIEW COMPARISON:  February 13, 2020 FINDINGS: The cardiomediastinal silhouette is unchanged in contour.Status post median sternotomy and CABG. Low lung volumes. No pleural effusion. No pneumothorax. LEFT basilar linear opacities. Surgical clips project over the upper abdomen. IMPRESSION: LEFT basilar linear opacities, likely atelectasis. Low lung volumes. Electronically Signed   By: Valentino Saxon M.D.   On: 11/06/2020 18:24    Cardiac Studies   Cardiac authorization showed occluded SVG to obtuse marginal jump graft, successfully stented, thrombectomy  Patient Profile     80 y.o. male with STEMI, post CABG SVG to acute marginal jump graft occlusion successfully stented/thrombectomy  Assessment & Plan    Posterior STEMI in setting of known CAD post CABG in 2009, successful SVG to obtuse marginal thrombectomy/PCI - Continue to watch for any adverse arrhythmias.  Overall progressing well. -Did require Levophed during left heart catheterization  secondary to transient cardiogenic shock.  Improved. -Awaiting echocardiogram -Continue with goal-directed medical therapy including aspirin Brilinta high intensity statin. -Beta-blocker  on hold secondary to hypotension.  Since his blood pressure is slightly improved, we will go ahead and add metoprolol tartrate 12. 80 dose 5 mg twice a day.  PVCs are noted as well as brief nonsustained VT.  Hyperlipidemia - Continue with atorvastatin high intensity dose.  Atorvastatin 40 mg once a day.  He was on atorvastatin 20 mg at home.  Multiple allergies are listed next to statins, he seems to have been taking 20 mg of atorvastatin at home.  Continue with Zetia as well.  LDL goal less than 70.  Peripheral arterial disease - Left iliac stent with bilateral SFA occlusions -Followed by Dr. Sophronia Simas as well as Dr. Johnsie Cancel  Chronic abdominal discomfort not always postprandial - Extensive work-up with mesenteric angiogram in April 2021 that showed no significant stenosis  Bilateral carotid artery disease - Continue statin, antiplatelet, mild obstructive disease  BPH - On Flomax.  No changes made.  GERD - On proton pump inhibitor.  Cardiac rehab.  If he continues to improve, hopeful discharge tomorrow.  For questions or updates, please contact [place] Please consult [email or web address] for contact info under   CRITICAL CARE Performed by: Candee Furbish   Total critical care time: 35 minutes  Critical care time was exclusive of separately billable procedures and treating other patients.  Critical care was necessary to treat or prevent imminent or life-threatening deterioration.  Critical care was time spent personally by me on the following activities: development of treatment plan with patient and/or surrogate as well as nursing, discussions with consultants, evaluation of patient's response to treatment, examination of patient, obtaining history from patient or surrogate, ordering and performing  treatments and interventions, ordering and review of laboratory studies, ordering and review of radiographic studies, pulse oximetry and re-evaluation of patient's condition.       Signed, Candee Furbish, MD  11/07/2020, 8:56 AM

## 2020-11-07 NOTE — Progress Notes (Signed)
Pulled right femoral sheath at 2323, pressured held for 20 mins by Verdia Kuba.  Pts BP, HR, 02 sats WNL during removal.  Slight bruising near femoral site, no hematoma present, still blood coming from site.    Additional pressure held for 10 mins by Candyce Churn.  Pts BP, HR, 02 sats WNL throughout process. Dressing applied at 2353.  No hematoma present, minimal bruising present, dressing clean, dress, and intact.

## 2020-11-07 NOTE — Progress Notes (Signed)
MD notified of pt's xray results and also notified that lasix had already been given, pt c/o constipation, new order obtained for laxative. Patient and spouse updated of the new orders.

## 2020-11-07 NOTE — Progress Notes (Signed)
MD notified of pt's SOB at rest, family at bedside updated. New orders obtained, pt sitting in the chair upright, vs wnl.

## 2020-11-07 NOTE — Progress Notes (Signed)
Echocardiogram 2D Echocardiogram has been performed.  [place] Deputy Anushri Casalino RDCS 11/07/2020, 12:19 PM

## 2020-11-08 ENCOUNTER — Other Ambulatory Visit (HOSPITAL_COMMUNITY): Payer: Self-pay

## 2020-11-08 ENCOUNTER — Encounter (HOSPITAL_COMMUNITY): Payer: Self-pay | Admitting: Cardiology

## 2020-11-08 DIAGNOSIS — I2129 ST elevation (STEMI) myocardial infarction involving other sites: Secondary | ICD-10-CM | POA: Diagnosis not present

## 2020-11-08 LAB — BASIC METABOLIC PANEL
Anion gap: 13 (ref 5–15)
BUN: 28 mg/dL — ABNORMAL HIGH (ref 8–23)
CO2: 28 mmol/L (ref 22–32)
Calcium: 9.2 mg/dL (ref 8.9–10.3)
Chloride: 95 mmol/L — ABNORMAL LOW (ref 98–111)
Creatinine, Ser: 1.31 mg/dL — ABNORMAL HIGH (ref 0.61–1.24)
GFR, Estimated: 55 mL/min — ABNORMAL LOW (ref >60)
Glucose, Bld: 237 mg/dL — ABNORMAL HIGH (ref 70–99)
Potassium: 4.1 mmol/L (ref 3.5–5.1)
Sodium: 136 mmol/L (ref 135–145)

## 2020-11-08 LAB — POCT I-STAT, CHEM 8
BUN: 20 mg/dL (ref 8–23)
Calcium, Ion: 1.04 mmol/L — ABNORMAL LOW (ref 1.15–1.40)
Chloride: 72 mmol/L — ABNORMAL LOW (ref 98–111)
Creatinine, Ser: 0.4 mg/dL — ABNORMAL LOW (ref 0.61–1.24)
Glucose, Bld: 204 mg/dL — ABNORMAL HIGH (ref 70–99)
HCT: 40 % (ref 39.0–52.0)
Hemoglobin: 13.6 g/dL (ref 13.0–17.0)
Potassium: 2.7 mmol/L — CL (ref 3.5–5.1)
Sodium: 110 mmol/L — CL (ref 135–145)
TCO2: 21 mmol/L — ABNORMAL LOW (ref 22–32)

## 2020-11-08 LAB — CBC
HCT: 45.6 % (ref 39.0–52.0)
Hemoglobin: 15.4 g/dL (ref 13.0–17.0)
MCH: 30.7 pg (ref 26.0–34.0)
MCHC: 33.8 g/dL (ref 30.0–36.0)
MCV: 90.8 fL (ref 80.0–100.0)
Platelets: 215 10*3/uL (ref 150–400)
RBC: 5.02 MIL/uL (ref 4.22–5.81)
RDW: 13.5 % (ref 11.5–15.5)
WBC: 27.8 10*3/uL — ABNORMAL HIGH (ref 4.0–10.5)
nRBC: 0 % (ref 0.0–0.2)

## 2020-11-08 LAB — GLUCOSE, CAPILLARY
Glucose-Capillary: 348 mg/dL — ABNORMAL HIGH (ref 70–99)
Glucose-Capillary: 311 mg/dL — ABNORMAL HIGH (ref 70–99)
Glucose-Capillary: 253 mg/dL — ABNORMAL HIGH (ref 70–99)
Glucose-Capillary: 79 mg/dL (ref 70–99)
Glucose-Capillary: 140 mg/dL — ABNORMAL HIGH (ref 70–99)

## 2020-11-08 MED ORDER — LOSARTAN POTASSIUM 25 MG PO TABS
12.5000 mg | ORAL_TABLET | Freq: Every day | ORAL | Status: DC
  Administered 2020-11-08 – 2020-11-09 (×2): 12.5 mg via ORAL
  Filled 2020-11-08 (×2): qty 1

## 2020-11-08 MED ORDER — MAGNESIUM HYDROXIDE 400 MG/5ML PO SUSP
30.0000 mL | Freq: Every day | ORAL | Status: DC | PRN
  Administered 2020-11-08: 30 mL via ORAL
  Filled 2020-11-08: qty 30

## 2020-11-08 MED ORDER — FUROSEMIDE 10 MG/ML IJ SOLN
20.0000 mg | Freq: Once | INTRAMUSCULAR | Status: AC
  Administered 2020-11-08: 20 mg via INTRAVENOUS
  Filled 2020-11-08: qty 2

## 2020-11-08 MED ORDER — INSULIN ASPART 100 UNIT/ML IJ SOLN
6.0000 [IU] | Freq: Three times a day (TID) | INTRAMUSCULAR | Status: DC
  Administered 2020-11-08 – 2020-11-11 (×9): 6 [IU] via SUBCUTANEOUS

## 2020-11-08 MED ORDER — INSULIN ASPART 100 UNIT/ML IJ SOLN: 0.0000 [IU] | Freq: Every day | INTRAMUSCULAR | Status: DC

## 2020-11-08 MED ORDER — INSULIN ASPART 100 UNIT/ML IJ SOLN
0.0000 [IU] | Freq: Three times a day (TID) | INTRAMUSCULAR | Status: DC
Start: 2020-11-08 — End: 2020-11-11
  Administered 2020-11-08: 8 [IU] via SUBCUTANEOUS
  Administered 2020-11-10: 3 [IU] via SUBCUTANEOUS

## 2020-11-08 NOTE — TOC Benefit Eligibility Note (Signed)
Patient Teacher, English as a foreign language completed.    The patient is currently admitted and upon discharge could be taking Brilinta 90 mg.  The current 30 day co-pay is, $37.00.   The patient is currently admitted and upon discharge could be taking Jardiance 10 mg.  The current 30 day co-pay is, $37.00.   The patient is currently admitted and upon discharge could be taking Farxiga 10 mg.  Non Formulary  The patient is insured through [employer] Medicare Part D    Lyndel Safe, [place] Patient Advocate Specialist [place] Team Direct Number: [phone]  Fax: [phone]

## 2020-11-08 NOTE — Progress Notes (Signed)
CARDIAC REHAB PHASE I   PRE:  Rate/Rhythm: 71 SR    BP: sitting 113/56    SaO2: 100 5L, 97 2L  MODE:  Ambulation: 300 ft   POST:  Rate/Rhythm: 76 SR    BP: sitting 92/46     SaO2: 96 2L, 90-92 RA  Pt used RW and 2L O2, gait belt, assist x1. Pt with severe anterior lean due to bad back. Uses a cane at home. Slow pace, trouble staying close to RW and consistently took steps outside of RW on turns. Needed reminders and gait belt support. Pt could stand taller when reminded. Main c/o walking was a cough that increased with ambulation. SaO2 96 2L. Return to recliner, d/c'd O2 and SaO2 90-92 RA. BP lower after walk but denied dizziness.   Discussed MI and stent/Brilinta. Pt does not have hearing aids in therefore will return and discuss more when he is wearing them. Pt would benefit from PT c/s due to gait with RW.   [id number] [place], ACSM 11/08/2020 11:27 AM

## 2020-11-08 NOTE — Plan of Care (Signed)
  Problem: Activity: Goal: Risk for activity intolerance will decrease Outcome: Progressing   Problem: Nutrition: Goal: Adequate nutrition will be maintained Outcome: Progressing   Problem: Elimination: Goal: Will not experience complications related to bowel motility Outcome: Progressing Goal: Will not experience complications related to urinary retention Outcome: Progressing   Problem: Pain Managment: Goal: General experience of comfort will improve Outcome: Progressing   Problem: Skin Integrity: Goal: Risk for impaired skin integrity will decrease Outcome: Progressing

## 2020-11-08 NOTE — Progress Notes (Signed)
MD notified to call spouse as requested. Phone number provided to the doctor.

## 2020-11-08 NOTE — Progress Notes (Signed)
Progress Note  Patient Name: Ricardo Le Date of Encounter: 11/08/2020  Primary Cardiologist: Dr. Johnsie Cancel  Subjective   Feels better without recurrent chest pain today.  Breathing has improved.  Inpatient Medications    Scheduled Meds:  aspirin EC  81 mg Oral Daily   atorvastatin  40 mg Oral Daily   bethanechol  25 mg Oral BID   Chlorhexidine Gluconate Cloth  6 each Topical Q0600   dutasteride  0.5 mg Oral Daily   ezetimibe  10 mg Oral Daily   gabapentin  300 mg Oral TID   glimepiride  4 mg Oral Daily   heparin  5,000 Units Subcutaneous Q8H   insulin aspart  0-15 Units Subcutaneous TID WC   insulin aspart  3 Units Subcutaneous TID WC   insulin glargine-yfgn  18 Units Subcutaneous QHS   mouth rinse  15 mL Mouth Rinse BID   metoprolol tartrate  12.5 mg Oral BID   mupirocin ointment  1 application Nasal BID   pantoprazole  40 mg Oral Daily   polyethylene glycol  17 g Oral Daily   sodium chloride flush  3 mL Intravenous Q12H   tamsulosin  0.4 mg Oral QPC supper   ticagrelor  90 mg Oral BID   Continuous Infusions:  sodium chloride Stopped (11/06/20 1947)   sodium chloride     sodium chloride 10 mL/hr at 11/07/20 0700   norepinephrine (LEVOPHED) Adult infusion Stopped (11/07/20 0034)   PRN Meds: sodium chloride, acetaminophen, guaiFENesin-dextromethorphan, ipratropium-albuterol, ondansetron (ZOFRAN) IV, sodium chloride flush   Vital Signs    Vitals:   11/08/20 0500 11/08/20 0600 11/08/20 0759 11/08/20 0800  BP: 106/62 114/65  111/87  Pulse: 64 64  74  Resp: 20 16  (!) 22  Temp:   97.8 F (36.6 C)   TempSrc:   Oral   SpO2: 100% 100%  99%  Weight:      Height:        Intake/Output Summary (Last 24 hours) at 11/08/2020 0904 Last data filed at 11/08/2020 0000 Gross per 24 hour  Intake 1440 ml  Output 2875 ml  Net -1435 ml    I/O since admission:   Filed Weights   11/06/20 1808 11/07/20 0600  Weight: 71.7 kg 73.1 kg    Telemetry    Sinus rhythm in  the 70s - Personally Reviewed  ECG    ECG (independently read by me): Sinus at 83, PVC, RBBB  Physical Exam    BP 111/87 (BP Location: Left Arm)   Pulse 74   Temp 97.8 F (36.6 C) (Oral)   Resp (!) 22   Ht 5\' 7"  (1.702 m)   Wt 73.1 kg   SpO2 99%   BMI 25.24 kg/m  General: Alert, oriented, no distress.  Skin: normal turgor, no rashes, warm and dry HEENT: Normocephalic, atraumatic. Pupils equal round and reactive to light; sclera anicteric; extraocular muscles intact;  Nose without nasal septal hypertrophy Mouth/Parynx benign;  Neck: No JVD, no carotid bruits; normal carotid upstroke Lungs: decreased BS at bases, R> L Chest wall: without tenderness to palpitation Heart: PMI not displaced, RRR, s1 s2 normal, 1/6 systolic murmur, no diastolic murmur, no rubs, gallops, thrills, or heaves Abdomen: soft, nontender; no hepatosplenomehaly, BS+; abdominal aorta nontender and not dilated by palpation. Back: no CVA tenderness Pulses 2+ Musculoskeletal: full range of motion, normal strength, no joint deformities Extremities: no clubbing cyanosis or edema, Homan's sign negative  Neurologic: grossly nonfocal; Cranial nerves grossly wnl Psychologic:  Normal mood and affect   Labs    Chemistry Recent Labs  Lab 11/06/20 1801 11/06/20 2313 11/07/20 0355 11/07/20 2019 11/08/20 0131  NA 137  --  135 137 136  K 3.6  --  4.4 4.1 4.1  CL 100  --  99 98 95*  CO2 22  --  24 28 28   GLUCOSE 298*  --  357* 169* 237*  BUN 18  --  23 28* 28*  CREATININE 0.95   < > 1.41* 1.20 1.31*  CALCIUM 9.6  --  8.9 9.2 9.2  PROT 7.9  --   --   --   --   ALBUMIN 3.8  --   --   --   --   AST 23  --   --   --   --   ALT 22  --   --   --   --   ALKPHOS 73  --   --   --   --   BILITOT 0.9  --   --   --   --   GFRNONAA >60   < > 51* >60 55*  ANIONGAP 15  --  12 11 13    < > = values in this interval not displayed.     Hematology Recent Labs  Lab 11/06/20 2313 11/07/20 0355 11/08/20 0131  WBC  16.7* 15.5* 27.8*  RBC 4.27 4.57 5.02  HGB 13.6 14.3 15.4  HCT 38.9* 41.7 45.6  MCV 91.1 91.2 90.8  MCH 31.9 31.3 30.7  MCHC 35.0 34.3 33.8  RDW 13.3 13.4 13.5  PLT 243 232 215   HS Trop: 24 > 18968  Cardiac EnzymesNo results for input(s): TROPONINI in the last 168 hours. No results for input(s): TROPIPOC in the last 168 hours.   BNPNo results for input(s): BNP, PROBNP in the last 168 hours.   DDimer No results for input(s): DDIMER in the last 168 hours.   Lipid Panel     Component Value Date/Time   CHOL 164 11/06/2020 1801   CHOL 210 (H) 09/08/2019 1506   TRIG 123 11/06/2020 1801   HDL 50 11/06/2020 1801   HDL 48 09/08/2019 1506   CHOLHDL 3.3 11/06/2020 1801   VLDL 25 11/06/2020 1801   LDLCALC 89 11/06/2020 1801   LDLCALC 145 (H) 09/08/2019 1506     Radiology    CARDIAC CATHETERIZATION  Result Date: 11/06/2020   Prox LAD to Mid LAD lesion is 85% stenosed.   Prox LAD lesion is 100% stenosed.   Dist Cx lesion is 100% stenosed.   Prox RCA to Dist RCA lesion is 100% stenosed.   Ost 1st Diag lesion is 70% stenosed.   Ost 2nd Diag to 2nd Diag lesion is 70% stenosed.   Ost 3rd Mrg lesion is 100% stenosed.   Mid Graft to Dist Graft lesion before 1st Mrg  is 100% stenosed.   Ost LAD to Prox LAD lesion is 100% stenosed.   Ost Cx to Dist Cx lesion is 100% stenosed.   A drug-eluting stent was successfully placed using a STENT ONYX FRONTIER 3.5X30.   Post intervention, there is a 0% residual stenosis.   SVG and is large.   There is mild left ventricular systolic dysfunction.   LV end diastolic pressure is severely elevated.   The left ventricular ejection fraction is 45-50% by visual estimate. 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 Patent LIMA to the LAD Patent SVG to the PDA Occluded SVG  sequentially to OM1 and OM2 Mild LV dysfunction. EF 45%. Elevated LVEDP 30 mm Hg Successful PCI of the SVG to OM1 and OM2 with aspiration thrombectomy and DES x 1 Plan: DAPT for one year.  Continue pressor support- wean as tolerated. Angiomax discontinued- plan to remove sheath in 2 hours.   DG CHEST PORT 1 VIEW  Result Date: 11/07/2020 CLINICAL DATA:  Hypoxia, chest pain EXAM: PORTABLE CHEST 1 VIEW COMPARISON:  11/07/2020 FINDINGS: Prior CABG. Consolidation in the right upper lobe, worsening since prior study. Low lung volumes. Left base atelectasis or infiltrate. No effusions. No acute bony abnormality. IMPRESSION: Worsening right upper lobe consolidation. Left base atelectasis or infiltrate. Electronically Signed   By: Rolm Baptise M.D.   On: 11/07/2020 22:00   DG CHEST PORT 1 VIEW  Result Date: 11/07/2020 CLINICAL DATA:  Dyspnea, chest pain EXAM: PORTABLE CHEST 1 VIEW COMPARISON:  11/06/2020 FINDINGS: New, subtle heterogeneous airspace opacity of the bilateral lung bases. Unchanged cardiomegaly status post median sternotomy and CABG. IMPRESSION: New, subtle heterogeneous airspace opacity of the bilateral lung bases, concerning for infection or aspiration, or alternately edema. Electronically Signed   By: Eddie Candle M.D.   On: 11/07/2020 13:43   DG Chest Port 1 View  Result Date: 11/06/2020 CLINICAL DATA:  chest pain EXAM: PORTABLE CHEST 1 VIEW COMPARISON:  February 13, 2020 FINDINGS: The cardiomediastinal silhouette is unchanged in contour.Status post median sternotomy and CABG. Low lung volumes. No pleural effusion. No pneumothorax. LEFT basilar linear opacities. Surgical clips project over the upper abdomen. IMPRESSION: LEFT basilar linear opacities, likely atelectasis. Low lung volumes. Electronically Signed   By: Valentino Saxon M.D.   On: 11/06/2020 18:24   ECHOCARDIOGRAM COMPLETE  Result Date: 11/07/2020    ECHOCARDIOGRAM REPORT   Patient Name:   Ricardo Le Date of Exam: 11/07/2020 Medical Rec #:  [id number]        Height:       67.0 in Accession #:    [id number]       Weight:       161.2 lb Date of Birth:  11/24/1940        BSA:          1.845 m Patient Age:    10  years         BP:           123/65 mmHg Patient Gender: M                HR:           78 bpm. Exam Location:  Inpatient Procedure: 2D Echo, Color Doppler and Cardiac Doppler Indications:    Myocardial Infarction i21.9  History:        Patient has prior history of Echocardiogram examinations, most                 recent 12/23/2018. Prior CABG; Risk Factors:Hypertension,                 Diabetes and Dyslipidemia.  Sonographer:    Raquel Sarna Senior RDCS Referring Phys: [id number] PETER M [place]  Sonographer Comments: Suboptimal apical, subcostal, and suprasternal windows. IMPRESSIONS  1. Left ventricular ejection fraction, by estimation, is 35 to 40%. The left ventricle has moderately decreased function. The left ventricle demonstrates regional wall motion abnormalities (see scoring diagram/findings for description). The left ventricular internal cavity size was moderately dilated. Left ventricular diastolic parameters are consistent with Grade II diastolic dysfunction (pseudonormalization). Elevated left ventricular end-diastolic pressure.  2. Right ventricular systolic function is normal. The right ventricular size is normal. There is normal pulmonary artery systolic pressure.  3. Left atrial size was moderately dilated.  4. The mitral valve is degenerative. Mild mitral valve regurgitation. No evidence of mitral stenosis. Moderate mitral annular calcification.  5. Tricuspid valve regurgitation is moderate.  6. The aortic valve is tricuspid. Aortic valve regurgitation is not visualized. Moderate sclerosis without stenosis especially non coronary cusp.  7. The inferior vena cava is normal in size with greater than 50% respiratory variability, suggesting right atrial pressure of 3 mmHg. FINDINGS  Left Ventricle: Endocarium not well seen septal apical akinesis inferior wall hypokinesis. Left ventricular ejection fraction, by estimation, is 35 to 40%. The left ventricle has moderately decreased function. The left ventricle  demonstrates regional wall motion abnormalities. The left ventricular internal cavity size was moderately dilated. There is no left ventricular hypertrophy. Left ventricular diastolic parameters are consistent with Grade II diastolic dysfunction (pseudonormalization). Elevated left ventricular end-diastolic pressure. Right Ventricle: The right ventricular size is normal. No increase in right ventricular wall thickness. Right ventricular systolic function is normal. There is normal pulmonary artery systolic pressure. The tricuspid regurgitant velocity is 2.35 m/s, and  with an assumed right atrial pressure of 8 mmHg, the estimated right ventricular systolic pressure is 25.8 mmHg. Left Atrium: Left atrial size was moderately dilated. Right Atrium: Right atrial size was normal in size. Pericardium: There is no evidence of pericardial effusion. Mitral Valve: The mitral valve is degenerative in appearance. There is mild thickening of the mitral valve leaflet(s). There is mild calcification of the mitral valve leaflet(s). Moderate mitral annular calcification. Mild mitral valve regurgitation. No evidence of mitral valve stenosis. Tricuspid Valve: The tricuspid valve is normal in structure. Tricuspid valve regurgitation is moderate . No evidence of tricuspid stenosis. Aortic Valve: The aortic valve is tricuspid. Aortic valve regurgitation is not visualized. Moderate sclerosis without stenosis especially non coronary cusp. Pulmonic Valve: The pulmonic valve was normal in structure. Pulmonic valve regurgitation is not visualized. No evidence of pulmonic stenosis. Aorta: The aortic root is normal in size and structure. Venous: The inferior vena cava is normal in size with greater than 50% respiratory variability, suggesting right atrial pressure of 3 mmHg. IAS/Shunts: The interatrial septum was not well visualized.  LEFT VENTRICLE PLAX 2D LVIDd:         4.30 cm  Diastology LVIDs:         3.20 cm  LV e' medial:    7.29 cm/s LV  PW:         1.30 cm  LV E/e' medial:  14.4 LV IVS:        0.90 cm  LV e' lateral:   6.64 cm/s LVOT diam:     1.90 cm  LV E/e' lateral: 15.8 LV SV:         34 LV SV Index:   18 LVOT Area:     2.84 cm  RIGHT VENTRICLE RV S prime:     5.87 cm/s TAPSE (M-mode): 1.3 cm LEFT ATRIUM             Index       RIGHT ATRIUM          Index LA diam:        4.60 cm 2.49 cm/m  RA Area:     8.24 cm LA Vol (A2C):   59.5 ml 32.25 ml/m RA Volume:   14.40 ml 7.81 ml/m LA Vol (A4C):  72.4 ml 39.24 ml/m LA Biplane Vol: 67.0 ml 36.32 ml/m  AORTIC VALVE LVOT Vmax:   63.90 cm/s LVOT Vmean:  45.500 cm/s LVOT VTI:    0.120 m  AORTA Ao Root diam: 3.30 cm Ao Asc diam:  3.50 cm MITRAL VALVE                TRICUSPID VALVE MV Area (PHT): 3.46 cm     TR Peak grad:   22.1 mmHg MV Decel Time: 219 msec     TR Vmax:        235.00 cm/s MV E velocity: 105.00 cm/s MV A velocity: 91.70 cm/s   SHUNTS MV E/A ratio:  1.15         Systemic VTI:  0.12 m                             Systemic Diam: 1.90 cm Jenkins Rouge MD Electronically signed by Jenkins Rouge MD Signature Date/Time: 11/07/2020/12:50:56 PM    Final     Cardiac Studies     Prox LAD to Mid LAD lesion is 85% stenosed.   Prox LAD lesion is 100% stenosed.   Dist Cx lesion is 100% stenosed.   Prox RCA to Dist RCA lesion is 100% stenosed.   Ost 1st Diag lesion is 70% stenosed.   Ost 2nd Diag to 2nd Diag lesion is 70% stenosed.   Ost 3rd Mrg lesion is 100% stenosed.   Mid Graft to Dist Graft lesion before 1st Mrg  is 100% stenosed.   Ost LAD to Prox LAD lesion is 100% stenosed.   Ost Cx to Dist Cx lesion is 100% stenosed.   A drug-eluting stent was successfully placed using a STENT ONYX FRONTIER 3.5X30.   Post intervention, there is a 0% residual stenosis.   SVG and is large.   There is mild left ventricular systolic dysfunction.   LV end diastolic pressure is severely elevated.   The left ventricular ejection fraction is 45-50% by visual estimate.   3 vessel occlusive CAD.  Patient is graft dependent. Patent LIMA to the LAD Patent SVG to the PDA Occluded SVG sequentially to OM1 and OM2 Mild LV dysfunction. EF 45%.  Elevated LVEDP 30 mm Hg Successful PCI of the SVG to OM1 and OM2 with aspiration thrombectomy and DES x 1   Plan: DAPT for one year. Continue pressor support- wean as tolerated. Angiomax discontinued- plan to remove sheath in 2 hours.    Intervention    ECHO 11/07/2020  Left ventricular ejection fraction, by estimation, is 35 to 40%. The left ventricle has moderately decreased function. The left ventricle demonstrates regional wall motion abnormalities (see scoring diagram/findings for description). The left ventricular internal cavity size was moderately dilated. Left ventricular diastolic parameters are consistent with Grade II diastolic dysfunction (pseudonormalization). Elevated left ventricular end-diastolic pressure. 1. Right ventricular systolic function is normal. The right ventricular size is normal. There is normal pulmonary artery systolic pressure. 2. 3. Left atrial size was moderately dilated. The mitral valve is degenerative. Mild mitral valve regurgitation. No evidence of mitral stenosis. Moderate mitral annular calcification. 4. 5. Tricuspid valve regurgitation is moderate. The aortic valve is tricuspid. Aortic valve regurgitation are very visualized. Moderate sclerosis without stenosis especially non coronary cusp. 6. The inferior vena cava is normal in size with greater than 50% respiratory variability, suggesting right atrial pressure of 3 mmHg.   Patient Profile     80 y.o.  male who is  s/p CABG X4 in 2009,GERD. PAD, HLD, T2DM who suffered a STEM due to SVG to acute marginal jump graft occlusion successfully stented/thrombectomy  Assessment & Plan    Day 2 s/p STEMI due to occluded [facility] sequentially supplying OM1-2.  Status post thrombectomy and DES stent to SVG.  Now on DAPT with aspirin/Brilinta and low-dose  metoprolol tartrate 12.5 mg twice a day.  He had occasional PVCs which have improved with initiation of beta-blocker therapy.  2.  Ischemic cardiomyopathy: EF on echo Doppler 35 to 40%.  Plan to initiate low-dose ARB therapy today as blood pressure allows will initiate losartan 12.5 mg today is to titrate tomorrow as tolerated.  With  decreased breath sounds and slight worsening of chest x-ray with increased opacity will give furosemide 20 mg IV today.  3.  Hyperlipidemia: He is now on increased atorvastatin 40 mg from previous 20 mg dose as well as Zetia 10 mg.  Target LDL less than 70.  LDL was 89.  If does not attain goal to titrate to atorvastatin 80 mg.  4.  PVD: Status post left iliac stent with bilateral SFA occlusion followed by Dr. [name] in addition to Dr. Johnsie Cancel  5.  Mild carotid disease  6. GERD on PPI  7.  Diabetes mellitus: Currently on insulin; may be a good candidate for future SGT2 inhibition particularly if LV function remains reduced    Signed, [name] Sine, MD, [facility] 11/08/2020, 9:04 AM

## 2020-11-09 DIAGNOSIS — I2129 ST elevation (STEMI) myocardial infarction involving other sites: Secondary | ICD-10-CM | POA: Diagnosis not present

## 2020-11-09 LAB — CBC
HCT: 44.2 % (ref 39.0–52.0)
Hemoglobin: 14.9 g/dL (ref 13.0–17.0)
MCH: 30.9 pg (ref 26.0–34.0)
MCHC: 33.7 g/dL (ref 30.0–36.0)
MCV: 91.7 fL (ref 80.0–100.0)
Platelets: 202 10*3/uL (ref 150–400)
RBC: 4.82 MIL/uL (ref 4.22–5.81)
RDW: 13.5 % (ref 11.5–15.5)
WBC: 16.6 10*3/uL — ABNORMAL HIGH (ref 4.0–10.5)
nRBC: 0 % (ref 0.0–0.2)

## 2020-11-09 LAB — BASIC METABOLIC PANEL
Anion gap: 9 (ref 5–15)
BUN: 34 mg/dL — ABNORMAL HIGH (ref 8–23)
CO2: 31 mmol/L (ref 22–32)
Calcium: 8.9 mg/dL (ref 8.9–10.3)
Chloride: 95 mmol/L — ABNORMAL LOW (ref 98–111)
Creatinine, Ser: 1.14 mg/dL (ref 0.61–1.24)
GFR, Estimated: >60 mL/min (ref >60)
Glucose, Bld: 119 mg/dL — ABNORMAL HIGH (ref 70–99)
Potassium: 4 mmol/L (ref 3.5–5.1)
Sodium: 135 mmol/L (ref 135–145)

## 2020-11-09 LAB — GLUCOSE, CAPILLARY
Glucose-Capillary: 99 mg/dL (ref 70–99)
Glucose-Capillary: 86 mg/dL (ref 70–99)
Glucose-Capillary: 104 mg/dL — ABNORMAL HIGH (ref 70–99)
Glucose-Capillary: 170 mg/dL — ABNORMAL HIGH (ref 70–99)

## 2020-11-09 LAB — TSH: TSH: 2.314 u[IU]/mL (ref 0.350–4.500)

## 2020-11-09 MED ORDER — METOPROLOL SUCCINATE ER 25 MG PO TB24
25.0000 mg | ORAL_TABLET | Freq: Every day | ORAL | Status: DC
  Administered 2020-11-10 – 2020-11-11 (×2): 25 mg via ORAL
  Filled 2020-11-09 (×2): qty 1

## 2020-11-09 MED ORDER — INSULIN GLARGINE-YFGN 100 UNIT/ML ~~LOC~~ SOLN
15.0000 [IU] | Freq: Every day | SUBCUTANEOUS | Status: DC
  Administered 2020-11-09 – 2020-11-10 (×2): 15 [IU] via SUBCUTANEOUS
  Filled 2020-11-09 (×3): qty 0.15

## 2020-11-09 MED ORDER — SPIRONOLACTONE 12.5 MG HALF TABLET
12.5000 mg | ORAL_TABLET | Freq: Every day | ORAL | Status: DC
  Administered 2020-11-09 – 2020-11-11 (×3): 12.5 mg via ORAL
  Filled 2020-11-09 (×3): qty 1

## 2020-11-09 MED ORDER — LOSARTAN POTASSIUM 25 MG PO TABS
12.5000 mg | ORAL_TABLET | Freq: Two times a day (BID) | ORAL | Status: DC
  Administered 2020-11-09 – 2020-11-11 (×4): 12.5 mg via ORAL
  Filled 2020-11-09 (×4): qty 1

## 2020-11-09 NOTE — Progress Notes (Signed)
Progress Note  Patient Name: Ricardo Le Date of Encounter: 11/09/2020  Primary Cardiologist: Dr. Johnsie Cancel  Subjective   No recurrent chest pain; breathing better  Inpatient Medications    Scheduled Meds:  aspirin EC  81 mg Oral Daily   atorvastatin  40 mg Oral Daily   bethanechol  25 mg Oral BID   Chlorhexidine Gluconate Cloth  6 each Topical Q0600   dutasteride  0.5 mg Oral Daily   ezetimibe  10 mg Oral Daily   gabapentin  300 mg Oral TID   glimepiride  4 mg Oral Daily   heparin  5,000 Units Subcutaneous Q8H   insulin aspart  0-15 Units Subcutaneous TID WC   insulin aspart  0-5 Units Subcutaneous QHS   insulin aspart  6 Units Subcutaneous TID WC   insulin glargine-yfgn  18 Units Subcutaneous QHS   losartan  12.5 mg Oral Daily   mouth rinse  15 mL Mouth Rinse BID   metoprolol tartrate  12.5 mg Oral BID   mupirocin ointment  1 application Nasal BID   pantoprazole  40 mg Oral Daily   polyethylene glycol  17 g Oral Daily   sodium chloride flush  3 mL Intravenous Q12H   tamsulosin  0.4 mg Oral QPC supper   ticagrelor  90 mg Oral BID   Continuous Infusions:  sodium chloride Stopped (11/06/20 1947)   sodium chloride     sodium chloride 10 mL/hr at 11/07/20 0700   PRN Meds: sodium chloride, acetaminophen, guaiFENesin-dextromethorphan, ipratropium-albuterol, magnesium hydroxide, ondansetron (ZOFRAN) IV, sodium chloride flush   Vital Signs    Vitals:   11/09/20 0400 11/09/20 0510 11/09/20 0600 11/09/20 0804  BP: (!) 110/58 (!) 117/54 122/60   Pulse: 69 70 61   Resp: (!) 31 (!) 23 19   Temp:    98 F (36.7 C)  TempSrc:    Oral  SpO2: 94% 94% 93%   Weight:      Height:        Intake/Output Summary (Last 24 hours) at 11/09/2020 0959 Last data filed at 11/09/2020 4268 Gross per 24 hour  Intake 719 ml  Output 1900 ml  Net -1181 ml    I/O since admission: +104  Filed Weights   11/06/20 1808 11/07/20 0600  Weight: 71.7 kg 73.1 kg    Telemetry    Sinus  rhythm in the 70s - Personally Reviewed  ECG    11/09/2020 ECG (independently read by me): NSR at 69; Q III, aVF, nonspecific T changes,   11/07/2020 ECG (independently read by me): Sinus at 83, PVC, RBBB  Physical Exam   BP 122/60   Pulse 61   Temp 98 F (36.7 C) (Oral)   Resp 19   Ht 5\' 7"  (1.702 m)   Wt 73.1 kg   SpO2 93%   BMI 25.24 kg/m  General: Alert, oriented, no distress.  Skin: normal turgor, no rashes, warm and dry HEENT: Normocephalic, atraumatic. Pupils equal round and reactive to light; sclera anicteric; extraocular muscles intact;  Nose without nasal septal hypertrophy Mouth/Parynx benign; Mallinpatti scale Neck: decreased BS at bases Chest wall: without tenderness to palpitation Heart: PMI not displaced, RRR, s1 s2 normal, 1/6 systolic murmur, no diastolic murmur, no rubs, gallops, thrills, or heaves Abdomen: soft, nontender; no hepatosplenomehaly, BS+; abdominal aorta nontender and not dilated by palpation. Back: no CVA tenderness Pulses 2+ Musculoskeletal: full range of motion, normal strength, no joint deformities Extremities: no clubbing cyanosis or edema, Homan's sign  negative  Neurologic: grossly nonfocal; Cranial nerves grossly wnl Psychologic: Normal mood and affect   Labs    Chemistry Recent Labs  Lab 11/06/20 1801 11/06/20 1909 11/07/20 2019 11/08/20 0131 11/09/20 0038  NA 137   < > 137 136 135  K 3.6   < > 4.1 4.1 4.0  CL 100   < > 98 95* 95*  CO2 22   < > 28 28 31   GLUCOSE 298*   < > 169* 237* 119*  BUN 18   < > 28* 28* 34*  CREATININE 0.95   < > 1.20 1.31* 1.14  CALCIUM 9.6   < > 9.2 9.2 8.9  PROT 7.9  --   --   --   --   ALBUMIN 3.8  --   --   --   --   AST 23  --   --   --   --   ALT 22  --   --   --   --   ALKPHOS 73  --   --   --   --   BILITOT 0.9  --   --   --   --   GFRNONAA >60   < > >60 55* >60  ANIONGAP 15   < > 11 13 9    < > = values in this interval not displayed.     Hematology Recent Labs  Lab 11/06/20 2313  11/07/20 0355 11/08/20 0131  WBC 16.7* 15.5* 27.8*  RBC 4.27 4.57 5.02  HGB 13.6 14.3 15.4  HCT 38.9* 41.7 45.6  MCV 91.1 91.2 90.8  MCH 31.9 31.3 30.7  MCHC 35.0 34.3 33.8  RDW 13.3 13.4 13.5  PLT 243 232 215   HS Trop: 24 > 18968  Cardiac EnzymesNo results for input(s): TROPONINI in the last 168 hours. No results for input(s): TROPIPOC in the last 168 hours.   BNPNo results for input(s): BNP, PROBNP in the last 168 hours.   DDimer No results for input(s): DDIMER in the last 168 hours.   Lipid Panel     Component Value Date/Time   CHOL 164 11/06/2020 1801   CHOL 210 (H) 09/08/2019 1506   TRIG 123 11/06/2020 1801   HDL 50 11/06/2020 1801   HDL 48 09/08/2019 1506   CHOLHDL 3.3 11/06/2020 1801   VLDL 25 11/06/2020 1801   LDLCALC 89 11/06/2020 1801   LDLCALC 145 (H) 09/08/2019 1506     Radiology    DG CHEST PORT 1 VIEW  Result Date: 11/07/2020 CLINICAL DATA:  Hypoxia, chest pain EXAM: PORTABLE CHEST 1 VIEW COMPARISON:  11/07/2020 FINDINGS: Prior CABG. Consolidation in the right upper lobe, worsening since prior study. Low lung volumes. Left base atelectasis or infiltrate. No effusions. No acute bony abnormality. IMPRESSION: Worsening right upper lobe consolidation. Left base atelectasis or infiltrate. Electronically Signed   By: Rolm Baptise M.D.   On: 11/07/2020 22:00   DG CHEST PORT 1 VIEW  Result Date: 11/07/2020 CLINICAL DATA:  Dyspnea, chest pain EXAM: PORTABLE CHEST 1 VIEW COMPARISON:  11/06/2020 FINDINGS: New, subtle heterogeneous airspace opacity of the bilateral lung bases. Unchanged cardiomegaly status post median sternotomy and CABG. IMPRESSION: New, subtle heterogeneous airspace opacity of the bilateral lung bases, concerning for infection or aspiration, or alternately edema. Electronically Signed   By: Eddie Candle M.D.   On: 11/07/2020 13:43   ECHOCARDIOGRAM COMPLETE  Result Date: 11/07/2020    ECHOCARDIOGRAM REPORT   Patient Name:   [facility] Date of  Exam: 11/07/2020 Medical Rec #:  [id number]        Height:       67.0 in Accession #:    [id number]       Weight:       161.2 lb Date of Birth:  Jul 16, 1940        BSA:          1.845 m Patient Age:    80 years         BP:           123/65 mmHg Patient Gender: M                HR:           78 bpm. Exam Location:  Inpatient Procedure: 2D Echo, Color Doppler and Cardiac Doppler Indications:    Myocardial Infarction i21.9  History:        Patient has prior history of Echocardiogram examinations, most                 recent 12/23/2018. Prior CABG; Risk Factors:Hypertension,                 Diabetes and Dyslipidemia.  Sonographer:    Raquel Sarna Senior RDCS Referring Phys: [id number] PETER M [place]  Sonographer Comments: Suboptimal apical, subcostal, and suprasternal windows. IMPRESSIONS  1. Left ventricular ejection fraction, by estimation, is 35 to 40%. The left ventricle has moderately decreased function. The left ventricle demonstrates regional wall motion abnormalities (see scoring diagram/findings for description). The left ventricular internal cavity size was moderately dilated. Left ventricular diastolic parameters are consistent with Grade II diastolic dysfunction (pseudonormalization). Elevated left ventricular end-diastolic pressure.  2. Right ventricular systolic function is normal. The right ventricular size is normal. There is normal pulmonary artery systolic pressure.  3. Left atrial size was moderately dilated.  4. The mitral valve is degenerative. Mild mitral valve regurgitation. No evidence of mitral stenosis. Moderate mitral annular calcification.  5. Tricuspid valve regurgitation is moderate.  6. The aortic valve is tricuspid. Aortic valve regurgitation is not visualized. Moderate sclerosis without stenosis especially non coronary cusp.  7. The inferior vena cava is normal in size with greater than 50% respiratory variability, suggesting right atrial pressure of 3 mmHg. FINDINGS  Left Ventricle: Endocarium not well  seen septal apical akinesis inferior wall hypokinesis. Left ventricular ejection fraction, by estimation, is 35 to 40%. The left ventricle has moderately decreased function. The left ventricle demonstrates regional wall motion abnormalities. The left ventricular internal cavity size was moderately dilated. There is no left ventricular hypertrophy. Left ventricular diastolic parameters are consistent with Grade II diastolic dysfunction (pseudonormalization). Elevated left ventricular end-diastolic pressure. Right Ventricle: The right ventricular size is normal. No increase in right ventricular wall thickness. Right ventricular systolic function is normal. There is normal pulmonary artery systolic pressure. The tricuspid regurgitant velocity is 2.35 m/s, and  with an assumed right atrial pressure of 8 mmHg, the estimated right ventricular systolic pressure is 02.6 mmHg. Left Atrium: Left atrial size was moderately dilated. Right Atrium: Right atrial size was normal in size. Pericardium: There is no evidence of pericardial effusion. Mitral Valve: The mitral valve is degenerative in appearance. There is mild thickening of the mitral valve leaflet(s). There is mild calcification of the mitral valve leaflet(s). Moderate mitral annular calcification. Mild mitral valve regurgitation. No evidence of mitral valve stenosis. Tricuspid Valve: The tricuspid valve is normal in structure. Tricuspid valve regurgitation is moderate . No evidence  of tricuspid stenosis. Aortic Valve: The aortic valve is tricuspid. Aortic valve regurgitation is not visualized. Moderate sclerosis without stenosis especially non coronary cusp. Pulmonic Valve: The pulmonic valve was normal in structure. Pulmonic valve regurgitation is not visualized. No evidence of pulmonic stenosis. Aorta: The aortic root is normal in size and structure. Venous: The inferior vena cava is normal in size with greater than 50% respiratory variability, suggesting right atrial  pressure of 3 mmHg. IAS/Shunts: The interatrial septum was not well visualized.  LEFT VENTRICLE PLAX 2D LVIDd:         4.30 cm  Diastology LVIDs:         3.20 cm  LV e' medial:    7.29 cm/s LV PW:         1.30 cm  LV E/e' medial:  14.4 LV IVS:        0.90 cm  LV e' lateral:   6.64 cm/s LVOT diam:     1.90 cm  LV E/e' lateral: 15.8 LV SV:         34 LV SV Index:   18 LVOT Area:     2.84 cm  RIGHT VENTRICLE RV S prime:     5.87 cm/s TAPSE (M-mode): 1.3 cm LEFT ATRIUM             Index       RIGHT ATRIUM          Index LA diam:        4.60 cm 2.49 cm/m  RA Area:     8.24 cm LA Vol (A2C):   59.5 ml 32.25 ml/m RA Volume:   14.40 ml 7.81 ml/m LA Vol (A4C):   72.4 ml 39.24 ml/m LA Biplane Vol: 67.0 ml 36.32 ml/m  AORTIC VALVE LVOT Vmax:   63.90 cm/s LVOT Vmean:  45.500 cm/s LVOT VTI:    0.120 m  AORTA Ao Root diam: 3.30 cm Ao Asc diam:  3.50 cm MITRAL VALVE                TRICUSPID VALVE MV Area (PHT): 3.46 cm     TR Peak grad:   22.1 mmHg MV Decel Time: 219 msec     TR Vmax:        235.00 cm/s MV E velocity: 105.00 cm/s MV A velocity: 91.70 cm/s   SHUNTS MV E/A ratio:  1.15         Systemic VTI:  0.12 m                             Systemic Diam: 1.90 cm Jenkins Rouge MD Electronically signed by Jenkins Rouge MD Signature Date/Time: 11/07/2020/12:50:56 PM    Final     Cardiac Studies     Prox LAD to Mid LAD lesion is 85% stenosed.   Prox LAD lesion is 100% stenosed.   Dist Cx lesion is 100% stenosed.   Prox RCA to Dist RCA lesion is 100% stenosed.   Ost 1st Diag lesion is 70% stenosed.   Ost 2nd Diag to 2nd Diag lesion is 70% stenosed.   Ost 3rd Mrg lesion is 100% stenosed.   Mid Graft to Dist Graft lesion before 1st Mrg  is 100% stenosed.   Ost LAD to Prox LAD lesion is 100% stenosed.   Ost Cx to Dist Cx lesion is 100% stenosed.   A drug-eluting stent was successfully placed using a STENT ONYX FRONTIER 3.5X30.  Post intervention, there is a 0% residual stenosis.   SVG and is large.   There is mild  left ventricular systolic dysfunction.   LV end diastolic pressure is severely elevated.   The left ventricular ejection fraction is 45-50% by visual estimate.   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 Patent LIMA to the LAD Patent SVG to the PDA Occluded SVG sequentially to OM1 and OM2 Mild LV dysfunction. EF 45%.  Elevated LVEDP 30 mm Hg Successful PCI of the SVG to OM1 and OM2 with aspiration thrombectomy and DES x 1   Plan: DAPT for one year. Continue pressor support- wean as tolerated. Angiomax discontinued- plan to remove sheath in 2 hours.    Intervention    ECHO 11/07/2020  Left ventricular ejection fraction, by estimation, is 35 to 40%. The left ventricle has moderately decreased function. The left ventricle demonstrates regional wall motion abnormalities (see scoring diagram/findings for description). The left ventricular internal cavity size was moderately dilated. Left ventricular diastolic parameters are consistent with Grade II diastolic dysfunction (pseudonormalization). Elevated left ventricular end-diastolic pressure. 1. Right ventricular systolic function is normal. The right ventricular size is normal. There is normal pulmonary artery systolic pressure. 2. 3. Left atrial size was moderately dilated. The mitral valve is degenerative. Mild mitral valve regurgitation. No evidence of mitral stenosis. Moderate mitral annular calcification. 4. 5. Tricuspid valve regurgitation is moderate. The aortic valve is tricuspid. Aortic valve regurgitation are very visualized. Moderate sclerosis without stenosis especially non coronary cusp. 6. The inferior vena cava is normal in size with greater than 50% respiratory variability, suggesting right atrial pressure of 3 mmHg.   Patient Profile     80 y.o. male who is  s/p CABG X4 in 2009,GERD. PAD, HLD, T2DM who suffered a STEM due to SVG to acute marginal jump graft occlusion successfully  stented/thrombectomy  Assessment & Plan    Day  3 s/p STEMI due to occluded [facility] sequentially supplying OM1-2.  Status post thrombectomy and DES stent to SVG.  Now on DAPT with aspirin/Brilinta.  He had occasional PVCs which have improved with initiation of beta-blocker therapy.  2.  Ischemic cardiomyopathy: EF on echo Doppler 35 to 40%.  Yesterday, patient was started on low-dose losartan 12.5 mg and continued to be on metoprolol tartrate 12.5 mg for his ventricular ectopy which had stabilized.  He received a dose of furosemide 20 mg.  Urine output was not significant.  With his reduced LV function, will initiate spironolactone 12.5 mg daily, titrate losartan to 12.5 mg twice a day, and tomorrow plan to transition from metoprolol to tartrate to metoprolol succinate 25 mg daily.  If LV function does not significantly improve as an outpatient, would recommend transition from [facility] to [facility].  Also with his diabetes mellitus and reduced LV function he would be a good candidate for SGLT2 inhibition.  3.  Hyperlipidemia: He is now on increased atorvastatin 40 mg from previous 20 mg dose as well as Zetia 10 mg.  Target LDL less than 70.  LDL was 89.  Recommend follow-up lipid panel as outpatient and if he does not reach target attemp titration of atorvastatin to 80 mg daily.  4.  Leukocytosis: White blood count yesterday had increased to 20 7.8K.  We will recheck today.  Patient is afebrile.  Neutrophil count several days ago was 69%.   4.  PVD: Status post left iliac stent with bilateral SFA occlusion followed by Dr. [name] in addition to Dr. Johnsie Cancel  5.  Mild carotid disease  6. GERD on PPI  7.  Diabetes mellitus: Currently on insulin; may be a good candidate for future SGT2 inhibition particularly if LV function remains reduced. Pt had a low BS yesterday to 79, will decrease lantus insulin from 18 to 15 units.  Plan transfer to cardiac telemetry today.  Signed, [name] Sine, MD,  [facility] 11/09/2020, 9:59 AM

## 2020-11-09 NOTE — Plan of Care (Signed)
  Problem: Clinical Measurements: Goal: Ability to maintain clinical measurements within normal limits will improve Outcome: Progressing Goal: Respiratory complications will improve Outcome: Progressing Goal: Cardiovascular complication will be avoided Outcome: Progressing   Problem: Nutrition: Goal: Adequate nutrition will be maintained Outcome: Progressing   Problem: Coping: Goal: Level of anxiety will decrease Outcome: Progressing   Problem: Elimination: Goal: Will not experience complications related to urinary retention Outcome: Progressing   Problem: Pain Managment: Goal: General experience of comfort will improve Outcome: Progressing   Problem: Skin Integrity: Goal: Risk for impaired skin integrity will decrease Outcome: Progressing   Problem: Cardiovascular: Goal: Vascular access site(s) Level 0-1 will be maintained Outcome: Progressing

## 2020-11-09 NOTE — Progress Notes (Signed)
CARDIAC REHAB PHASE I   PRE:  Rate/Rhythm: 63 SR    BP: sitting 125/66    SaO2: 95 RA  MODE:  Ambulation: 300 ft   POST:  Rate/Rhythm: 76 SR    BP: sitting 136/67     SaO2: 96 RA  Pt sts he doesn't feel as well today. Impulsive, getting out of chair before lines were organized and foot of recliner was put down. Once standing, turning left into the wall, instead of right to go out of room. Responds well to verbal commands. Used RW and gait belt. Fairly steady but gets feet outside RW on turns. He normally uses a cane at home. Pt coughing with walking again today.  Discussed with pt and family MI, stent, Brilinta, low sodium diet, daily wts, NTG, and CRPII. Pt quiet, family voiced understanding. Will refer to [facility]. Pt would benefit from PT eval or HHPT to ensure safety before d/c.   [place], ACSM 11/09/2020 2:45 PM

## 2020-11-10 ENCOUNTER — Other Ambulatory Visit (HOSPITAL_COMMUNITY): Payer: Self-pay

## 2020-11-10 ENCOUNTER — Ambulatory Visit: Payer: Medicare Other | Admitting: Sports Medicine

## 2020-11-10 ENCOUNTER — Inpatient Hospital Stay (HOSPITAL_COMMUNITY): Payer: Medicare Other

## 2020-11-10 LAB — BASIC METABOLIC PANEL
Anion gap: 11 (ref 5–15)
BUN: 27 mg/dL — ABNORMAL HIGH (ref 8–23)
CO2: 24 mmol/L (ref 22–32)
Calcium: 9.1 mg/dL (ref 8.9–10.3)
Chloride: 101 mmol/L (ref 98–111)
Creatinine, Ser: 1.07 mg/dL (ref 0.61–1.24)
GFR, Estimated: >60 mL/min (ref >60)
Glucose, Bld: 133 mg/dL — ABNORMAL HIGH (ref 70–99)
Potassium: 3.6 mmol/L (ref 3.5–5.1)
Sodium: 136 mmol/L (ref 135–145)

## 2020-11-10 LAB — CBC
HCT: 44 % (ref 39.0–52.0)
Hemoglobin: 14.9 g/dL (ref 13.0–17.0)
MCH: 30.8 pg (ref 26.0–34.0)
MCHC: 33.9 g/dL (ref 30.0–36.0)
MCV: 91.1 fL (ref 80.0–100.0)
Platelets: 206 10*3/uL (ref 150–400)
RBC: 4.83 MIL/uL (ref 4.22–5.81)
RDW: 13.4 % (ref 11.5–15.5)
WBC: 15.2 10*3/uL — ABNORMAL HIGH (ref 4.0–10.5)
nRBC: 0 % (ref 0.0–0.2)

## 2020-11-10 LAB — GLUCOSE, CAPILLARY
Glucose-Capillary: 198 mg/dL — ABNORMAL HIGH (ref 70–99)
Glucose-Capillary: 195 mg/dL — ABNORMAL HIGH (ref 70–99)
Glucose-Capillary: 108 mg/dL — ABNORMAL HIGH (ref 70–99)
Glucose-Capillary: 228 mg/dL — ABNORMAL HIGH (ref 70–99)
Glucose-Capillary: 117 mg/dL — ABNORMAL HIGH (ref 70–99)
Glucose-Capillary: 124 mg/dL — ABNORMAL HIGH (ref 70–99)

## 2020-11-10 MED ORDER — TICAGRELOR 90 MG PO TABS
90.0000 mg | ORAL_TABLET | Freq: Two times a day (BID) | ORAL | 2 refills | Status: DC
Start: 2020-11-10 — End: 2020-11-25
  Filled 2020-11-10: qty 60, 30d supply, fill #0
  Filled 2020-11-25: qty 180, 90d supply, fill #1
  Filled 2020-11-25: qty 60, 30d supply, fill #1

## 2020-11-10 MED ORDER — METOPROLOL SUCCINATE ER 25 MG PO TB24
25.0000 mg | ORAL_TABLET | Freq: Every day | ORAL | 1 refills | Status: DC
  Filled 2020-11-10: qty 90, 90d supply, fill #0

## 2020-11-10 MED ORDER — ATORVASTATIN CALCIUM 40 MG PO TABS
40.0000 mg | ORAL_TABLET | Freq: Every day | ORAL | 0 refills | Status: DC
  Filled 2020-11-10: qty 90, 90d supply, fill #0

## 2020-11-10 MED ORDER — SPIRONOLACTONE 25 MG PO TABS
12.5000 mg | ORAL_TABLET | Freq: Every day | ORAL | 1 refills | Status: DC
  Filled 2020-11-10: qty 45, 90d supply, fill #0

## 2020-11-10 MED ORDER — LOSARTAN POTASSIUM 25 MG PO TABS
12.5000 mg | ORAL_TABLET | Freq: Two times a day (BID) | ORAL | 1 refills | Status: DC
Start: 2020-11-10 — End: 2020-11-25
  Filled 2020-11-10: qty 45, 45d supply, fill #0
  Filled 2020-11-25: qty 45, 45d supply, fill #1

## 2020-11-10 NOTE — Progress Notes (Signed)
PT Cancellation Note  Patient Details Name: Ricardo Le MRN: [id number] DOB: [birth year]   Cancelled Treatment:    Reason Eval/Treat Not Completed: Other (comment) Per RN, pt just fell asleep and requesting to let him sleep. Will follow up as schedule allows.   Lou Miner, DPT  Acute Rehabilitation Services  Pager: [phone] Office: [phone]    Rudean Hitt 11/10/2020, 12:45 PM

## 2020-11-10 NOTE — Evaluation (Addendum)
Physical Therapy Evaluation Patient Details Name: Ricardo Le MRN: [id number] DOB: 1940/03/11 Today's Date: 11/10/2020  History of Present Illness  Pt is a 80 y/o male admitted secondary to chest pain. Found to have STEMI and is s/p thrombectomy and DES stent to SVG. PMH includes DM, HTN, CAD s/p CABG.  Clinical Impression  Pt admitted secondary to problem above with deficits below. Very confused throughout session and thought he was at his house throughout majority of session. Pt fatiguing easily and demonstrated increased unsteadiness. Min A for steadying with use of cane. Spoke with pt's wife who reports confusion is new. Notified RN and PA as well. Anticipate if pt's progresses well, will be able to d/c home with HHPT and support from family. However, if pt does not progress well, will likely required SNF level therapies. Will continue to follow acutely.        Recommendations for follow up therapy are one component of a multi-disciplinary discharge planning process, led by the attending physician.  Recommendations may be updated based on patient status, additional functional criteria and insurance authorization.  Follow Up Recommendations Home health PT;Supervision/Assistance - 24 hour (pending progression; if pt does not progress well, may need to consider SNF)    Equipment Recommendations  None recommended by PT    Recommendations for Other Services       Precautions / Restrictions Precautions Precautions: Fall Restrictions Weight Bearing Restrictions: No      Mobility  Bed Mobility               General bed mobility comments: In chair upon entry    Transfers Overall transfer level: Needs assistance Equipment used: Straight cane Transfers: Sit to/from Stand Sit to Stand: Min assist         General transfer comment: Min A for lift assist and steadying. Safety cues to wait for PT.  Ambulation/Gait Ambulation/Gait assistance: Min assist Gait Distance  (Feet): 75 Feet Assistive device: Straight cane Gait Pattern/deviations: Step-through pattern;Decreased stride length;Trunk flexed Gait velocity: Decreased   General Gait Details: Unsteady gait and pt reporting increased fatigue. Requesting to sit, but when offered chair reports he wanted to go back to chair in his room. Easily distracted. Min A for steadying with use of cane.  Stairs            Wheelchair Mobility    Modified Rankin (Stroke Patients Only)       Balance Overall balance assessment: Needs assistance Sitting-balance support: No upper extremity supported;Feet supported Sitting balance-Leahy Scale: Fair     Standing balance support: Single extremity supported Standing balance-Leahy Scale: Poor Standing balance comment: Reliant on at least 1 UE support and external assist.                             Pertinent Vitals/Pain Pain Assessment: No/denies pain    Home Living Family/patient expects to be discharged to:: Private residence Living Arrangements: Spouse/significant other;Children Available Help at Discharge: Family;Available 24 hours/day Type of Home: House Home Access: Level entry     Home Layout: One level Home Equipment: Shower seat;Cane - single point;Walker - 2 wheels;Bedside commode      Prior Function Level of Independence: Independent with assistive device(s)         Comments: Used cane for mobility. Per wife, pt was driving and mowing the lawn before NSTEMI     Hand Dominance        Extremity/Trunk Assessment  Upper Extremity Assessment Upper Extremity Assessment: Defer to OT evaluation    Lower Extremity Assessment Lower Extremity Assessment: Generalized weakness    Cervical / Trunk Assessment Cervical / Trunk Assessment: Kyphotic  Communication   Communication: No difficulties  Cognition Arousal/Alertness: Awake/alert Behavior During Therapy: Flat affect Overall Cognitive Status: Impaired/Different from  baseline Area of Impairment: Orientation;Attention;Safety/judgement;Following commands;Memory;Awareness;Problem solving                 Orientation Level: Disoriented to;Place;Situation Current Attention Level: Sustained Memory: Decreased short-term memory;Decreased recall of precautions Following Commands: Follows one step commands with increased time Safety/Judgement: Decreased awareness of deficits;Decreased awareness of safety Awareness: Emergent Problem Solving: Slow processing;Decreased initiation;Difficulty sequencing;Requires verbal cues;Requires tactile cues General Comments: Pt believed he was at home, but then had moment of clarity that he was in the hospital, but then went back to saying he was at home. Asking where his cat was throughout session. Very confused throughout.      General Comments General comments (skin integrity, edema, etc.): Spoke with pt's wife over phone and pt's wife reports increased confusion is new and that she cannot take him home like this.    Exercises     Assessment/Plan    PT Assessment Patient needs continued PT services  PT Problem List Decreased strength;Decreased mobility;Decreased balance;Decreased activity tolerance;Decreased cognition;Decreased knowledge of use of DME;Decreased safety awareness;Decreased knowledge of precautions       PT Treatment Interventions Gait training;DME instruction;Stair training;Therapeutic activities;Functional mobility training;Balance training;Therapeutic exercise;Patient/family education;Cognitive remediation    PT Goals (Current goals can be found in the Care Plan section)  Acute Rehab PT Goals Patient Stated Goal: for pt to get better per wife PT Goal Formulation: With patient/family Time For Goal Achievement: 11/24/20 Potential to Achieve Goals: Fair    Frequency Min 3X/week   Barriers to discharge        Co-evaluation               AM-PAC PT "6 Clicks" Mobility  Outcome Measure  Help needed turning from your back to your side while in a flat bed without using bedrails?: A Little Help needed moving from lying on your back to sitting on the side of a flat bed without using bedrails?: A Little Help needed moving to and from a bed to a chair (including a wheelchair)?: A Little Help needed standing up from a chair using your arms (e.g., wheelchair or bedside chair)?: A Little Help needed to walk in hospital room?: A Little Help needed climbing 3-5 steps with a railing? : A Lot 6 Click Score: 17    End of Session Equipment Utilized During Treatment: Gait belt Activity Tolerance: Patient limited by fatigue Patient left: in chair;with call bell/phone within reach;with chair alarm set Nurse Communication: Mobility status PT Visit Diagnosis: Unsteadiness on feet (R26.81);Muscle weakness (generalized) (M62.81);Difficulty in walking, not elsewhere classified (R26.2)    Time: 6060-0459 PT Time Calculation (min) (ACUTE ONLY): 33 min   Charges:   PT Evaluation $PT Eval Moderate Complexity: 1 Mod PT Treatments $Gait Training: 8-22 mins        Lou Miner, DPT  Acute Rehabilitation Services  Pager: [phone] Office: [phone]   Rudean Hitt 11/10/2020, 3:27 PM

## 2020-11-10 NOTE — Progress Notes (Signed)
Patient appears very (pleasantly) confused.  At times answering questions appropriately however confused to time/date and situation.  Spoke with wife on phone and Mrs. Rock also agreed he is confused and not at baseline "he keeps repeating himself".  PT & OT at bedside and have noticed intermittent confusion.  Notified [place],PA of concern as well.  Will hold off on discharge home for now and readdress at a later time.

## 2020-11-10 NOTE — Evaluation (Signed)
Occupational Therapy Evaluation Patient Details Name: Ricardo Le MRN: [id number] DOB: 1940-08-27 Today's Date: 11/10/2020   History of Present Illness Pt is a 80 y/o male admitted secondary to chest pain. Found to have STEMI and is s/p thrombectomy and DES stent to SVG. PMH includes DM, HTN, CAD s/p CABG.   Clinical Impression   Prior to admission, pt walked with a cane and was independent in ADL, mowing his lawn and driving prior to admission. Spoke to wife by phone after she had talked to pt and she stated he was confused and not making sense. Pt presents with impaired cognition, generalized weakness and impaired standing balance. He needs up to min assist for ADL and transferred back to bed with RW and min guard assist. Will follow acutely. Pt is eager to go home, will plan on [place] depending on pt's progress.      Recommendations for follow up therapy are one component of a multi-disciplinary discharge planning process, led by the attending physician.  Recommendations may be updated based on patient status, additional functional criteria and insurance authorization.   Follow Up Recommendations  Home health OT;Supervision/Assistance - 24 hour    Equipment Recommendations  None recommended by OT    Recommendations for Other Services       Precautions / Restrictions Precautions Precautions: Fall Restrictions Weight Bearing Restrictions: No      Mobility Bed Mobility Overal bed mobility: Needs Assistance Bed Mobility: Sit to Supine       Sit to supine: Supervision   General bed mobility comments: supervision for safety    Transfers Overall transfer level: Needs assistance Equipment used: Rolling walker (2 wheeled) Transfers: Sit to/from [facility] Sit to Stand: Min guard Stand pivot transfers: Min guard       General transfer comment: slow to rise, cues to maintain grasp of walker    Balance Overall balance assessment: Needs  assistance Sitting-balance support: No upper extremity supported;Feet supported Sitting balance-Leahy Scale: Fair     Standing balance support: Bilateral upper extremity supported Standing balance-Leahy Scale: Poor Standing balance comment: safety increased with use of RW                           ADL either performed or assessed with clinical judgement   ADL Overall ADL's : Needs assistance/impaired Eating/Feeding: Set up;Sitting   Grooming: Minimal assistance;Sitting;Wash/dry hands Grooming Details (indicate cue type and reason): decreased thoroughness Upper Body Bathing: Sitting;Minimal assistance   Lower Body Bathing: Minimal assistance;Sitting/lateral leans   Upper Body Dressing : Minimal assistance;Sitting   Lower Body Dressing: Minimal assistance;Sit to/from stand   Toilet Transfer: Min guard;Stand-pivot;RW                   Vision Baseline Vision/History: 1 Wears glasses Patient Visual Report: No change from baseline Additional Comments: glasses are at home     Perception     Praxis      Pertinent Vitals/Pain Pain Assessment: Faces Faces Pain Scale: No hurt     Hand Dominance Right   Extremity/Trunk Assessment Upper Extremity Assessment Upper Extremity Assessment: Overall WFL for tasks assessed   Lower Extremity Assessment Lower Extremity Assessment: Defer to PT evaluation   Cervical / Trunk Assessment Cervical / Trunk Assessment: Kyphotic   Communication Communication Communication: HOH (L hearing aid)   Cognition Arousal/Alertness: Awake/alert Behavior During Therapy: Flat affect Overall Cognitive Status: Impaired/Different from baseline Area of Impairment: Orientation;Attention;Safety/judgement;Following commands;Memory;Awareness;Problem solving  Orientation Level: Disoriented to;Place;Situation Current Attention Level: Sustained Memory: Decreased short-term memory;Decreased recall of  precautions Following Commands: Follows one step commands with increased time Safety/Judgement: Decreased awareness of deficits;Decreased awareness of safety Awareness: Emergent Problem Solving: Slow processing;Decreased initiation;Difficulty sequencing;Requires verbal cues;Requires tactile cues General Comments: pleasantly confused, repeatedly asking his wife the same question on the phone, cues to use tv remote and phone, pt stating he was at home and later that he needed to go home and he would be fine, stated he was covered in concrete the last time he was in the hospital and he didn't want that to happen again   [place] with pt's wife over phone and pt's wife reports increased confusion is new and that she cannot take him home like this.    Exercises     Shoulder Instructions      Home Living Family/patient expects to be discharged to:: Private residence Living Arrangements: Spouse/significant other;Children Available Help at Discharge: Family;Available 24 hours/day Type of Home: House Home Access: Level entry     Home Layout: One level     Bathroom Shower/Tub: Occupational psychologist: Handicapped height     Home Equipment: Shower seat;Cane - single point;Walker - 2 wheels;Bedside commode          Prior Functioning/Environment Level of Independence: Independent with assistive device(s)        Comments: Used cane for mobility. Per wife, pt was driving and mowing the lawn before NSTEMI        OT Problem List: Decreased strength;Decreased activity tolerance;Impaired balance (sitting and/or standing);Decreased safety awareness;Decreased cognition;Decreased knowledge of use of DME or AE      OT Treatment/Interventions: Self-care/ADL training;DME and/or AE instruction;Patient/family education;Balance training;Cognitive remediation/compensation;Therapeutic activities    OT Goals(Current goals can be found in the care plan section) Acute Rehab  OT Goals Patient Stated Goal: for pt to get better per wife OT Goal Formulation: Patient unable to participate in goal setting Time For Goal Achievement: 11/24/20 Potential to Achieve Goals: Good ADL Goals Pt Will Perform Grooming: with supervision;standing Pt Will Perform Upper Body Dressing: with supervision;sitting Pt Will Perform Lower Body Dressing: with supervision;sit to/from stand Pt Will Transfer to Toilet: with supervision;ambulating;regular height toilet Pt Will Perform Toileting - Clothing Manipulation and hygiene: with supervision;sit to/from stand Additional ADL Goal #1: Pt will use memory strategies and environmental cues to correctly respond to orientation questions.  OT Frequency: Min 2X/week   Barriers to D/C:            Co-evaluation              AM-PAC OT "6 Clicks" Daily Activity     Outcome Measure Help from another person eating meals?: A Little Help from another person taking care of personal grooming?: A Little Help from another person toileting, which includes using toliet, bedpan, or urinal?: A Little Help from another person bathing (including washing, rinsing, drying)?: A Little Help from another person to put on and taking off regular upper body clothing?: A Little Help from another person to put on and taking off regular lower body clothing?: A Little 6 Click Score: 18   End of Session Equipment Utilized During Treatment: Gait belt;Rolling walker Nurse Communication: Other (comment) (aware of pt's change in cognition)  Activity Tolerance: Patient tolerated treatment well Patient left: in bed;with call bell/phone within reach;with bed alarm set  OT Visit Diagnosis: Unsteadiness on feet (R26.81);Other abnormalities of gait and mobility (R26.89);Muscle weakness (generalized) (M62.81);Other  symptoms and signs involving cognitive function                Time: 1458-1533 OT Time Calculation (min): 35 min Charges:  OT General Charges $OT Visit: 1  Visit OT Evaluation $OT Eval Moderate Complexity: 1 Mod OT Treatments $Self Care/Home Management : 8-22 mins  Nestor Lewandowsky, OTR/L Acute Rehabilitation Services Pager: [phone] Office: [phone]  [name] 11/10/2020, 4:22 PM

## 2020-11-10 NOTE — Discharge Summary (Signed)
Discharge Summary    Patient ID: Ricardo Le MRN: [id number]; DOB: 12-Sep-1940  Admit date: 11/06/2020 Discharge date: 11/11/2020  PCP:  Angelina Sheriff, MD   [facility] Providers Cardiologist:  Jenkins Rouge, MD   Discharge Diagnoses    Principal Problem:   Acute ST elevation myocardial infarction (STEMI) of posterior wall [facility]) Active Problems:   Type 2 diabetes mellitus with vascular disease ([place])   Essential hypertension   PAD (peripheral artery disease) ([place])   Hyperlipidemia   S/P CABG x 4   Intraventricular hemorrhage ([place])   Pulmonary embolism ([facility])   Acute ST elevation myocardial infarction (STEMI) of posterolateral wall [facility])   Diagnostic Studies/Procedures    Cath: 11/06/20  Prox LAD to Mid LAD lesion is 85% stenosed.   Prox LAD lesion is 100% stenosed.   Dist Cx lesion is 100% stenosed.   Prox RCA to Dist RCA lesion is 100% stenosed.   Ost 1st Diag lesion is 70% stenosed.   Ost 2nd Diag to 2nd Diag lesion is 70% stenosed.   Ost 3rd Mrg lesion is 100% stenosed.   Mid Graft to Dist Graft lesion before 1st Mrg  is 100% stenosed.   Ost LAD to Prox LAD lesion is 100% stenosed.   Ost Cx to Dist Cx lesion is 100% stenosed.   A drug-eluting stent was successfully placed using a STENT ONYX FRONTIER 3.5X30.   Post intervention, there is a 0% residual stenosis.   SVG and is large.   There is mild left ventricular systolic dysfunction.   LV end diastolic pressure is severely elevated.   The left ventricular ejection fraction is 45-50% by visual estimate.   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 Patent LIMA to the LAD Patent SVG to the PDA Occluded SVG sequentially to OM1 and OM2 Mild LV dysfunction. EF 45%.  Elevated LVEDP 30 mm Hg Successful PCI of the SVG to OM1 and OM2 with aspiration thrombectomy and DES x 1   Plan: DAPT for one year. Continue pressor support- wean as tolerated. Angiomax discontinued- plan to remove sheath in 2 hours.    Diagnostic Dominance: Right Intervention    Echo: 11/07/20  IMPRESSIONS     1. Left ventricular ejection fraction, by estimation, is 35 to 40%. The  left ventricle has moderately decreased function. The left ventricle  demonstrates regional wall motion abnormalities (see scoring  diagram/findings for description). The left  ventricular internal cavity size was moderately dilated. Left ventricular  diastolic parameters are consistent with Grade II diastolic dysfunction  (pseudonormalization). Elevated left ventricular end-diastolic pressure.   2. Right ventricular systolic function is normal. The right ventricular  size is normal. There is normal pulmonary artery systolic pressure.   3. Left atrial size was moderately dilated.   4. The mitral valve is degenerative. Mild mitral valve regurgitation. No  evidence of mitral stenosis. Moderate mitral annular calcification.   5. Tricuspid valve regurgitation is moderate.   6. The aortic valve is tricuspid. Aortic valve regurgitation is not  visualized. Moderate sclerosis without stenosis especially non coronary  cusp.   7. The inferior vena cava is normal in size with greater than 50%  respiratory variability, suggesting right atrial pressure of 3 mmHg.   FINDINGS   Left Ventricle: Endocarium not well seen septal apical akinesis inferior  wall hypokinesis. Left ventricular ejection fraction, by estimation, is 35  to 40%. The left ventricle has moderately decreased function. The left  ventricle demonstrates regional  wall motion  abnormalities. The left ventricular internal cavity size was  moderately dilated. There is no left ventricular hypertrophy. Left  ventricular diastolic parameters are consistent with Grade II diastolic  dysfunction (pseudonormalization).  Elevated left ventricular end-diastolic pressure.   Right Ventricle: The right ventricular size is normal. No increase in  right ventricular wall thickness. Right  ventricular systolic function is  normal. There is normal pulmonary artery systolic pressure. The tricuspid  regurgitant velocity is 2.35 m/s, and   with an assumed right atrial pressure of 8 mmHg, the estimated right  ventricular systolic pressure is 89.2 mmHg.   Left Atrium: Left atrial size was moderately dilated.   Right Atrium: Right atrial size was normal in size.   Pericardium: There is no evidence of pericardial effusion.   Mitral Valve: The mitral valve is degenerative in appearance. There is  mild thickening of the mitral valve leaflet(s). There is mild  calcification of the mitral valve leaflet(s). Moderate mitral annular  calcification. Mild mitral valve regurgitation. No  evidence of mitral valve stenosis.   Tricuspid Valve: The tricuspid valve is normal in structure. Tricuspid  valve regurgitation is moderate . No evidence of tricuspid stenosis.   Aortic Valve: The aortic valve is tricuspid. Aortic valve regurgitation is  not visualized. Moderate sclerosis without stenosis especially non  coronary cusp.   Pulmonic Valve: The pulmonic valve was normal in structure. Pulmonic valve  regurgitation is not visualized. No evidence of pulmonic stenosis.   Aorta: The aortic root is normal in size and structure.   Venous: The inferior vena cava is normal in size with greater than 50%  respiratory variability, suggesting right atrial pressure of 3 mmHg.   IAS/Shunts: The interatrial septum was not well visualized.  _____________   History of Present Illness     Ricardo Le is a 80 y.o. male with CAD s/p CABG X4  in 2009, GERD, PAD, HLD, T2DM who is being seen 11/06/2020 for the evaluation of chest pain/STEMI. He was in usual state of health til 7 hours prior to presentation when started having chest pain radiating to left shoulder blade. The chest pain worsened hence EMS was called. While on the EMS, there was ?VT and apparently had LOC but recovered on its own. Oval Linsey  tried to call it as a code STEMI in the field. But that EKG was inconclusive. In the ED, EKG had more dynamic changes concerning for STEMI hence STEMI was activated. He was given Aspirin and 4000 U of heparin and taken to the [facility]. He did not receive any nitroglycerin.   Hospital Course     STEMI: Underwent cardiac cath noted above with patent LIMA-LAD, SVG-PDA, occluded SVG to seq OM1/OM2 with successful PCI/DESx1 with aspiration thrombectomy to SVG-OM1/OM2. Plan for DAPT with ASA/Brilinta for at least one year.   ICM: Echo showed EF of 35-40%, g2DD with septal apical akinesis, inferior wall hypokinesis. Did receive IV lasix.  -- GDMT with Toprol 67m daily, losartan 12.557mdaily, spiro 12.73m16maily. [facility] if [facility] tolerate  HLD: LDL 89 -- started on atorvastatin 72m22mily, Zetia 10mg373mly  -- needs FLP/LFTs in 8 weeks   Leukocytosis: WBC 27>>16>>15.2. Initial CXR with RUL consolidation. Repeat showed improvement. Remained afebrile.   PVD: Status post left iliac stent with bilateral SFA occlusion followed by Dr. AridaFletcher Anonddition to Dr. NishaJohnsie Cancelotid Artery disease: mild disease  DM: Hgb A1c 7.4 -- treated with SSI while inpatient -- resumed on  home insulin regimen along with glimepiride  -- consider addition of SGLT2 at outpatient follow up  Intermittent confusion: seems to have some degree of confusion at baseline. Likely exacerbated by hospital stay. No focal deficits noted. Improved prior to discharge.   Seen by PT/OT with recommendations for [facility] ordered at discharge.   Patient was seen by Dr. Claiborne Billings and deemed stable for discharge home. Follow up in the office has been arranged. Medications sent to the [facility] pharmacy. Educated by PharmD prior to discharge.   Did the patient have an acute coronary syndrome (MI, NSTEMI, STEMI, etc) this admission?:  Yes                               AHA/ACC Clinical Performance & Quality  Measures: Aspirin prescribed? - Yes ADP Receptor Inhibitor (Plavix/Clopidogrel, Brilinta/Ticagrelor or Effient/Prasugrel) prescribed (includes medically managed patients)? - Yes Beta Blocker prescribed? - Yes High Intensity Statin (Lipitor 40-49m or Crestor 20-469m prescribed? - Yes EF assessed during THIS hospitalization? - Yes For EF <40%, was ACEI/ARB prescribed? - Yes For EF <40%, Aldosterone Antagonist (Spironolactone or Eplerenone) prescribed? - Yes Cardiac Rehab Phase II ordered (including medically managed patients)? - Yes      _____________  Discharge Vitals Blood pressure 136/64, pulse 66, temperature 98 F (36.7 C), temperature source Oral, resp. rate (!) 24, height _0  (1.702 m), weight 73.1 kg, SpO2 94 %.  Filed Weights   11/06/20 1808 11/07/20 0600  Weight: 71.7 kg 73.1 kg    Labs & Radiologic Studies    CBC Recent Labs    11/09/20 1037 11/10/20 0029  WBC 16.6* 15.2*  HGB 14.9 14.9  HCT 44.2 44.0  MCV 91.7 91.1  PLT 202 20458 Basic Metabolic Panel Recent Labs    11/09/20 0038 11/10/20 0029  NA 135 136  K 4.0 3.6  CL 95* 101  CO2 31 24  GLUCOSE 119* 133*  BUN 34* 27*  CREATININE 1.14 1.07  CALCIUM 8.9 9.1   Liver Function Tests No results for input(s): AST, ALT, ALKPHOS, BILITOT, PROT, ALBUMIN in the last 72 hours. No results for input(s): LIPASE, AMYLASE in the last 72 hours. High Sensitivity Troponin:   Recent Labs  Lab 11/06/20 1801 11/06/20 2313  TROPONINIHS 24* 18,968*    BNP Invalid input(s): POCBNP D-Dimer No results for input(s): DDIMER in the last 72 hours. Hemoglobin A1C No results for input(s): HGBA1C in the last 72 hours. Fasting Lipid Panel No results for input(s): CHOL, HDL, LDLCALC, TRIG, CHOLHDL, LDLDIRECT in the last 72 hours. Thyroid Function Tests Recent Labs    11/09/20 0038  TSH 2.314   _____________  DG Chest 2 View  Result Date: 11/10/2020 CLINICAL DATA:  History of palpitations, pneumonia.  Follow-up  EXAM: CHEST - 2 VIEW COMPARISON:  11/07/2020 FINDINGS: Previously seen right upper lobe consolidation has nearly completely resolved. Low lung volumes with bibasilar atelectasis. Prior CABG. Heart is normal size. No effusions or pneumothorax. IMPRESSION: Near complete resolution of the right upper lobe consolidation. Low lung volumes, bibasilar atelectasis. Electronically Signed   By: KeRolm Baptise.D.   On: 11/10/2020 13:05   CARDIAC CATHETERIZATION  Addendum Date: 11/09/2020     Prox LAD to Mid LAD lesion is 85% stenosed.   Prox LAD lesion is 100% stenosed.   Dist Cx lesion is 100% stenosed.   Prox RCA to Dist RCA lesion is 100% stenosed.   Ost 1st Diag lesion  is 70% stenosed.   Ost 2nd Diag to 2nd Diag lesion is 70% stenosed.   Ost 3rd Mrg lesion is 100% stenosed.   Mid Graft to Dist Graft lesion before 1st Mrg  is 100% stenosed.   Ost LAD to Prox LAD lesion is 100% stenosed.   Ost Cx to Dist Cx lesion is 100% stenosed.   A drug-eluting stent was successfully placed using a STENT ONYX FRONTIER 3.5X30.   Post intervention, there is a 0% residual stenosis.   SVG and is large.   There is mild left ventricular systolic dysfunction.   LV end diastolic pressure is severely elevated.   The left ventricular ejection fraction is 45-50% by visual estimate. 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 Patent LIMA to the LAD Patent SVG to the PDA Occluded SVG sequentially to OM1 and OM2 Mild LV dysfunction. EF 45%. Elevated LVEDP 30 mm Hg Successful PCI of the SVG to OM1 and OM2 with aspiration thrombectomy and DES x 1 Plan: DAPT for one year. Continue pressor support- wean as tolerated. Angiomax discontinued- plan to remove sheath in 2 hours.   Result Date: 11/09/2020   Prox LAD to Mid LAD lesion is 85% stenosed.   Prox LAD lesion is 100% stenosed.   Dist Cx lesion is 100% stenosed.   Prox RCA to Dist RCA lesion is 100% stenosed.   Ost 1st Diag lesion is 70% stenosed.   Ost 2nd Diag to 2nd Diag lesion is 70%  stenosed.   Ost 3rd Mrg lesion is 100% stenosed.   Mid Graft to Dist Graft lesion before 1st Mrg  is 100% stenosed.   Ost LAD to Prox LAD lesion is 100% stenosed.   Ost Cx to Dist Cx lesion is 100% stenosed.   A drug-eluting stent was successfully placed using a STENT ONYX FRONTIER 3.5X30.   Post intervention, there is a 0% residual stenosis.   SVG and is large.   There is mild left ventricular systolic dysfunction.   LV end diastolic pressure is severely elevated.   The left ventricular ejection fraction is 45-50% by visual estimate. 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 Patent LIMA to the LAD Patent SVG to the PDA Occluded SVG sequentially to OM1 and OM2 Mild LV dysfunction. EF 45%. Elevated LVEDP 30 mm Hg Successful PCI of the SVG to OM1 and OM2 with aspiration thrombectomy and DES x 1 Plan: DAPT for one year. Continue pressor support- wean as tolerated. Angiomax discontinued- plan to remove sheath in 2 hours.   DG CHEST PORT 1 VIEW  Result Date: 11/07/2020 CLINICAL DATA:  Hypoxia, chest pain EXAM: PORTABLE CHEST 1 VIEW COMPARISON:  11/07/2020 FINDINGS: Prior CABG. Consolidation in the right upper lobe, worsening since prior study. Low lung volumes. Left base atelectasis or infiltrate. No effusions. No acute bony abnormality. IMPRESSION: Worsening right upper lobe consolidation. Left base atelectasis or infiltrate. Electronically Signed   By: Rolm Baptise M.D.   On: 11/07/2020 22:00   DG CHEST PORT 1 VIEW  Result Date: 11/07/2020 CLINICAL DATA:  Dyspnea, chest pain EXAM: PORTABLE CHEST 1 VIEW COMPARISON:  11/06/2020 FINDINGS: New, subtle heterogeneous airspace opacity of the bilateral lung bases. Unchanged cardiomegaly status post median sternotomy and CABG. IMPRESSION: New, subtle heterogeneous airspace opacity of the bilateral lung bases, concerning for infection or aspiration, or alternately edema. Electronically Signed   By: Eddie Candle M.D.   On: 11/07/2020 13:43   DG Chest Port 1  View  Result Date: 11/06/2020  CLINICAL DATA:  chest pain EXAM: PORTABLE CHEST 1 VIEW COMPARISON:  February 13, 2020 FINDINGS: The cardiomediastinal silhouette is unchanged in contour.Status post median sternotomy and CABG. Low lung volumes. No pleural effusion. No pneumothorax. LEFT basilar linear opacities. Surgical clips project over the upper abdomen. IMPRESSION: LEFT basilar linear opacities, likely atelectasis. Low lung volumes. Electronically Signed   By: Valentino Saxon M.D.   On: 11/06/2020 18:24   ECHOCARDIOGRAM COMPLETE  Result Date: 11/07/2020    ECHOCARDIOGRAM REPORT   Patient Name:   Ricardo Le Date of Exam: 11/07/2020 Medical Rec #:  [id number]        Height:       67.0 in Accession #:    [id number]       Weight:       161.2 lb Date of Birth:  21-Nov-1940        BSA:          1.845 m Patient Age:    80 years         BP:           123/65 mmHg Patient Gender: M                HR:           78 bpm. Exam Location:  Inpatient Procedure: 2D Echo, Color Doppler and Cardiac Doppler Indications:    Myocardial Infarction i21.9  History:        Patient has prior history of Echocardiogram examinations, most                 recent 12/23/2018. Prior CABG; Risk Factors:Hypertension,                 Diabetes and Dyslipidemia.  Sonographer:    Raquel Sarna Senior RDCS Referring Phys: [id number] PETER M [place]  Sonographer Comments: Suboptimal apical, subcostal, and suprasternal windows. IMPRESSIONS  1. Left ventricular ejection fraction, by estimation, is 35 to 40%. The left ventricle has moderately decreased function. The left ventricle demonstrates regional wall motion abnormalities (see scoring diagram/findings for description). The left ventricular internal cavity size was moderately dilated. Left ventricular diastolic parameters are consistent with Grade II diastolic dysfunction (pseudonormalization). Elevated left ventricular end-diastolic pressure.  2. Right ventricular systolic function is normal. The right  ventricular size is normal. There is normal pulmonary artery systolic pressure.  3. Left atrial size was moderately dilated.  4. The mitral valve is degenerative. Mild mitral valve regurgitation. No evidence of mitral stenosis. Moderate mitral annular calcification.  5. Tricuspid valve regurgitation is moderate.  6. The aortic valve is tricuspid. Aortic valve regurgitation is not visualized. Moderate sclerosis without stenosis especially non coronary cusp.  7. The inferior vena cava is normal in size with greater than 50% respiratory variability, suggesting right atrial pressure of 3 mmHg. FINDINGS  Left Ventricle: Endocarium not well seen septal apical akinesis inferior wall hypokinesis. Left ventricular ejection fraction, by estimation, is 35 to 40%. The left ventricle has moderately decreased function. The left ventricle demonstrates regional wall motion abnormalities. The left ventricular internal cavity size was moderately dilated. There is no left ventricular hypertrophy. Left ventricular diastolic parameters are consistent with Grade II diastolic dysfunction (pseudonormalization). Elevated left ventricular end-diastolic pressure. Right Ventricle: The right ventricular size is normal. No increase in right ventricular wall thickness. Right ventricular systolic function is normal. There is normal pulmonary artery systolic pressure. The tricuspid regurgitant velocity is 2.35 m/s, and  with an assumed right atrial pressure of 8 mmHg, the  estimated right ventricular systolic pressure is 17.4 mmHg. Left Atrium: Left atrial size was moderately dilated. Right Atrium: Right atrial size was normal in size. Pericardium: There is no evidence of pericardial effusion. Mitral Valve: The mitral valve is degenerative in appearance. There is mild thickening of the mitral valve leaflet(s). There is mild calcification of the mitral valve leaflet(s). Moderate mitral annular calcification. Mild mitral valve regurgitation. No  evidence of mitral valve stenosis. Tricuspid Valve: The tricuspid valve is normal in structure. Tricuspid valve regurgitation is moderate . No evidence of tricuspid stenosis. Aortic Valve: The aortic valve is tricuspid. Aortic valve regurgitation is not visualized. Moderate sclerosis without stenosis especially non coronary cusp. Pulmonic Valve: The pulmonic valve was normal in structure. Pulmonic valve regurgitation is not visualized. No evidence of pulmonic stenosis. Aorta: The aortic root is normal in size and structure. Venous: The inferior vena cava is normal in size with greater than 50% respiratory variability, suggesting right atrial pressure of 3 mmHg. IAS/Shunts: The interatrial septum was not well visualized.  LEFT VENTRICLE PLAX 2D LVIDd:         4.30 cm  Diastology LVIDs:         3.20 cm  LV e' medial:    7.29 cm/s LV PW:         1.30 cm  LV E/e' medial:  14.4 LV IVS:        0.90 cm  LV e' lateral:   6.64 cm/s LVOT diam:     1.90 cm  LV E/e' lateral: 15.8 LV SV:         34 LV SV Index:   18 LVOT Area:     2.84 cm  RIGHT VENTRICLE RV S prime:     5.87 cm/s TAPSE (M-mode): 1.3 cm LEFT ATRIUM             Index       RIGHT ATRIUM          Index LA diam:        4.60 cm 2.49 cm/m  RA Area:     8.24 cm LA Vol (A2C):   59.5 ml 32.25 ml/m RA Volume:   14.40 ml 7.81 ml/m LA Vol (A4C):   72.4 ml 39.24 ml/m LA Biplane Vol: 67.0 ml 36.32 ml/m  AORTIC VALVE LVOT Vmax:   63.90 cm/s LVOT Vmean:  45.500 cm/s LVOT VTI:    0.120 m  AORTA Ao Root diam: 3.30 cm Ao Asc diam:  3.50 cm MITRAL VALVE                TRICUSPID VALVE MV Area (PHT): 3.46 cm     TR Peak grad:   22.1 mmHg MV Decel Time: 219 msec     TR Vmax:        235.00 cm/s MV E velocity: 105.00 cm/s MV A velocity: 91.70 cm/s   SHUNTS MV E/A ratio:  1.15         Systemic VTI:  0.12 m                             Systemic Diam: 1.90 cm Jenkins Rouge MD Electronically signed by Jenkins Rouge MD Signature Date/Time: 11/07/2020/12:50:56 PM    Final    Disposition    Pt is being discharged home today in good condition.  Follow-up Plans & Appointments     Follow-up Information     Wellington Hampshire, MD Follow up on  11/25/2020.   Specialty: Cardiology Why: at 3pm for your follow up appt Contact information: [place] [address] [phone]                Discharge Instructions     Amb Referral to Cardiac Rehabilitation   Complete by: As directed    To [facility]   Diagnosis:  Coronary Stents STEMI PTCA     After initial evaluation and assessments completed: Virtual Based Care may be provided alone or in conjunction with Phase 2 Cardiac Rehab based on patient barriers.: Yes   Diet - low sodium heart healthy   Complete by: As directed    Discharge instructions   Complete by: As directed    Radial Site Care Refer to this sheet in the next few weeks. These instructions provide you with information on caring for yourself after your procedure. Your caregiver may also give you more specific instructions. Your treatment has been planned according to current medical practices, but problems sometimes occur. Call your caregiver if you have any problems or questions after your procedure. HOME CARE INSTRUCTIONS You may shower the day after the procedure. Remove the bandage (dressing) and gently wash the site with plain soap and water. Gently pat the site dry.  Do not apply powder or lotion to the site.  Do not submerge the affected site in water for 3 to 5 days.  Inspect the site at least twice daily.  Do not flex or bend the affected arm for 24 hours.  No lifting over 5 pounds (2.3 kg) for 5 days after your procedure.  Do not drive home if you are discharged the same day of the procedure. Have someone else drive you.  You may drive 24 hours after the procedure unless otherwise instructed by your caregiver.  What to expect: Any bruising will usually fade within 1 to 2 weeks.  Blood that collects in the  tissue (hematoma) may be painful to the touch. It should usually decrease in size and tenderness within 1 to 2 weeks.  SEEK IMMEDIATE MEDICAL CARE IF: You have unusual pain at the radial site.  You have redness, warmth, swelling, or pain at the radial site.  You have drainage (other than a small amount of blood on the dressing).  You have chills.  You have a fever or persistent symptoms for more than 72 hours.  You have a fever and your symptoms suddenly get worse.  Your arm becomes pale, cool, tingly, or numb.  You have heavy bleeding from the site. Hold pressure on the site.   PLEASE DO NOT MISS ANY DOSES OF YOUR BRILINTA!!!!! Also keep a log of you blood pressures and bring back to your follow up appt. Please call the office with any questions.   Patients taking blood thinners should generally stay away from medicines like ibuprofen, Advil, Motrin, naproxen, and Aleve due to risk of stomach bleeding. You may take Tylenol as directed or talk to your primary doctor about alternatives.  PLEASE ENSURE THAT YOU DO NOT RUN OUT OF YOUR BRILINTA. This medication is very important to remain on for at least one year. IF you have issues obtaining this medication due to cost please CALL the office 3-5 business days prior to running out in order to prevent missing doses of this medication.   Face-to-face encounter (required for Medicare/Medicaid patients)   Complete by: As directed    I Reino Bellis certify that this patient is under my  care and that I, or a nurse practitioner or physician's assistant working with me, had a face-to-face encounter that meets the physician face-to-face encounter requirements with this patient on 11/11/2020. The encounter with the patient was in whole, or in part for the following medical condition(s) which is the primary reason for home health care (List medical condition): Deconditioned, CAD   The encounter with the patient was in whole, or in part, for the following  medical condition, which is the primary reason for home health care: deconditioned, CAD   I certify that, based on my findings, the following services are medically necessary home health services: Physical therapy   Reason for Medically Necessary Home Health Services: Other See Comments   My clinical findings support the need for the above services: OTHER SEE COMMENTS   Further, I certify that my clinical findings support that this patient is homebound due to: Ambulates short distances less than 300 feet   Home Health   Complete by: As directed    To provide the following care/treatments:  PT OT     Increase activity slowly   Complete by: As directed        Discharge Medications   Allergies as of 11/11/2020       Reactions   Dicyclomine Anaphylaxis   Iodinated Diagnostic Agents Other (See Comments)   Ioversol    Oxycodone Hcl Nausea And Vomiting   Ranexa [ranolazine] Other (See Comments)   Caused weakness (possibly)   Rosuvastatin Calcium Other (See Comments)   Muscle pain   Rosuvastatin Calcium Other (See Comments)   Myalgias Muscle pain   Tramadol Other (See Comments)   Makes patient "spaced out and dizzy"   Iohexol Hives, Itching   Patient is ok with premedication (Benadryl 50 mg by mouth); Onset Date: 12/03/2007 Patient is ok with premedication (Benadryl 50 mg by mouth); Onset Date: 12/03/2007   Statins Other (See Comments)   Muscle pain, but can tolerate Atorvastatin        Medication List     STOP taking these medications    amLODipine 10 MG tablet Commonly known as: NORVASC   cephALEXin 500 MG capsule Commonly known as: KEFLEX   diphenhydrAMINE 50 MG tablet Commonly known as: BENADRYL   docusate sodium 100 MG capsule Commonly known as: COLACE   glipiZIDE 5 MG tablet Commonly known as: GLUCOTROL   isosorbide mononitrate 30 MG 24 hr tablet Commonly known as: IMDUR   lisinopril-hydrochlorothiazide 20-12.5 MG tablet Commonly known as: ZESTORETIC    nystatin cream Commonly known as: MYCOSTATIN   senna 8.6 MG Tabs tablet Commonly known as: SENOKOT   sucralfate 1 g tablet Commonly known as: CARAFATE       TAKE these medications    acetaminophen 325 MG tablet Commonly known as: TYLENOL Take 650 mg by mouth every 6 (six) hours as needed (for pain).   aspirin EC 81 MG tablet Take 81 mg by mouth daily.   atorvastatin 40 MG tablet Commonly known as: LIPITOR Take 1 tablet (40 mg total) by mouth daily. What changed:  medication strength how much to take when to take this   BD Pen Needle Nano 2nd Gen 32G X 4 MM Misc Generic drug: Insulin Pen Needle 1 each by Other route daily.   bethanechol 25 MG tablet Commonly known as: URECHOLINE Take 25 mg by mouth 2 (two) times daily.   bisacodyl 5 MG EC tablet Commonly known as: DULCOLAX Take 5 mg by mouth every 3 (three) days.  Brilinta 90 MG Tabs tablet Generic drug: ticagrelor Take 1 tablet (90 mg total) by mouth 2 (two) times daily.   dutasteride 0.5 MG capsule Commonly known as: AVODART Take 0.5 mg by mouth daily.   esomeprazole 40 MG capsule Commonly known as: NEXIUM Take 30- 60 min before your first and last meals of the day What changed:  how much to take how to take this when to take this additional instructions   ezetimibe 10 MG tablet Commonly known as: ZETIA Take 1 tablet (10 mg total) by mouth daily.   gabapentin 300 MG capsule Commonly known as: NEURONTIN Take 300 mg by mouth 3 (three) times daily.   glimepiride 2 MG tablet Commonly known as: AMARYL Take 4 mg by mouth daily.   insulin glargine 100 UNIT/ML Solostar Pen Commonly known as: LANTUS Inject 18 Units into the skin at bedtime.   losartan 25 MG tablet Commonly known as: COZAAR Take 1/2 tablet (12.5 mg total) by mouth 2 (two) times daily.   melatonin 3 MG Tabs tablet Take 1 tablet (3 mg total) by mouth at bedtime.   metoprolol succinate 25 MG 24 hr tablet Commonly known as:  TOPROL-XL Take 1 tablet (25 mg total) by mouth daily.   multivitamin with minerals Tabs tablet Take 1 tablet by mouth daily.   nitroGLYCERIN 0.4 MG SL tablet Commonly known as: NITROSTAT PLACE 1 TABLET UNDER THE TONGUE EVERY 5 MINUTES AS NEEDED FOR CHEST PAIN What changed: See the new instructions.   OneTouch Verio Flex System w/Device Kit 1 each by Other route as directed.   OneTouch Verio test strip Generic drug: glucose blood 1 each by Other route 2 (two) times daily.   pantoprazole 40 MG tablet Commonly known as: PROTONIX Take 40 mg by mouth 2 (two) times daily.   spironolactone 25 MG tablet Commonly known as: ALDACTONE Take 1/2 tablet (12.5 mg total) by mouth daily.   tamsulosin 0.4 MG Caps capsule Commonly known as: FLOMAX Take 1 capsule (0.4 mg total) by mouth daily after supper.         Outstanding Labs/Studies   FLP/LFTs in 8 weeks  Duration of Discharge Encounter   Greater than 30 minutes including physician time.  Signed, Reino Bellis, NP 11/11/2020, 10:38 AM

## 2020-11-10 NOTE — Progress Notes (Signed)
Progress Note  Patient Name: Ricardo Le Date of Encounter: 11/10/2020  Primary Cardiologist: Dr. Johnsie Cancel  Subjective   No recurrent chest pain; weak, but improved  Inpatient Medications    Scheduled Meds:  aspirin EC  81 mg Oral Daily   atorvastatin  40 mg Oral Daily   bethanechol  25 mg Oral BID   Chlorhexidine Gluconate Cloth  6 each Topical Q0600   dutasteride  0.5 mg Oral Daily   ezetimibe  10 mg Oral Daily   gabapentin  300 mg Oral TID   glimepiride  4 mg Oral Daily   heparin  5,000 Units Subcutaneous Q8H   insulin aspart  0-15 Units Subcutaneous TID WC   insulin aspart  0-5 Units Subcutaneous QHS   insulin aspart  6 Units Subcutaneous TID WC   insulin glargine-yfgn  15 Units Subcutaneous QHS   losartan  12.5 mg Oral BID   mouth rinse  15 mL Mouth Rinse BID   metoprolol succinate  25 mg Oral Daily   mupirocin ointment  1 application Nasal BID   pantoprazole  40 mg Oral Daily   polyethylene glycol  17 g Oral Daily   sodium chloride flush  3 mL Intravenous Q12H   spironolactone  12.5 mg Oral Daily   tamsulosin  0.4 mg Oral QPC supper   ticagrelor  90 mg Oral BID   Continuous Infusions:  sodium chloride Stopped (11/06/20 1947)   sodium chloride     sodium chloride Stopped (11/07/20 0902)   PRN Meds: sodium chloride, acetaminophen, guaiFENesin-dextromethorphan, ipratropium-albuterol, magnesium hydroxide, ondansetron (ZOFRAN) IV, sodium chloride flush   Vital Signs    Vitals:   11/10/20 0200 11/10/20 0300 11/10/20 0400 11/10/20 0500  BP: 131/79 137/69    Pulse:   (!) 112   Resp: (!) 44 (!) 31 (!) 25 (!) 22  Temp:    97.8 F (36.6 C)  TempSrc:      SpO2:   96%   Weight:      Height:        Intake/Output Summary (Last 24 hours) at 11/10/2020 0844 Last data filed at 11/10/2020 0400 Gross per 24 hour  Intake 620.34 ml  Output 1100 ml  Net -479.66 ml    I/O since admission: -[place]   11/06/20 1808 11/07/20 0600  Weight: 71.7 kg  73.1 kg    Telemetry    Sinus rhythm in the 70s - Personally Reviewed  ECG    11/09/2020 ECG (independently read by me): NSR at 69; Q III, aVF, nonspecific T changes,   11/07/2020 ECG (independently read by me): Sinus at 83, PVC, RBBB  Physical Exam    BP 137/69   Pulse (!) 112   Temp 97.8 F (36.6 C)   Resp (!) 22   Ht 5\' 7"  (1.702 m)   Wt 73.1 kg   SpO2 96%   BMI 25.24 kg/m  General: Alert, oriented, no distress.  Skin: normal turgor, no rashes, warm and dry HEENT: Normocephalic, atraumatic. Pupils equal round and reactive to light; sclera anicteric; extraocular muscles intact;  Nose without nasal septal hypertrophy Mouth/Parynx benign; Mallinpatti scale 3 Neck: No JVD, no carotid bruits; normal carotid upstroke Lungs: Persistent decreased breath sounds right base Chest wall: without tenderness to palpitation Heart: PMI not displaced, RRR, s1 s2 normal, 1/6 systolic murmur, no diastolic murmur, no rubs, gallops, thrills, or heaves Abdomen: soft, nontender; no hepatosplenomehaly, BS+; abdominal aorta nontender and not dilated by palpation. Back: no  CVA tenderness Pulses 2+ Musculoskeletal: full range of motion, normal strength, no joint deformities Extremities: no clubbing cyanosis or edema, Homan's sign negative  Neurologic: grossly nonfocal; Cranial nerves grossly wnl Psychologic: Normal mood and affect   Labs    Chemistry Recent Labs  Lab 11/06/20 1801 11/06/20 1909 11/08/20 0131 11/09/20 0038 11/10/20 0029  NA 137   < > 136 135 136  K 3.6   < > 4.1 4.0 3.6  CL 100   < > 95* 95* 101  CO2 22   < > 28 31 24   GLUCOSE 298*   < > 237* 119* 133*  BUN 18   < > 28* 34* 27*  CREATININE 0.95   < > 1.31* 1.14 1.07  CALCIUM 9.6   < > 9.2 8.9 9.1  PROT 7.9  --   --   --   --   ALBUMIN 3.8  --   --   --   --   AST 23  --   --   --   --   ALT 22  --   --   --   --   ALKPHOS 73  --   --   --   --   BILITOT 0.9  --   --   --   --   GFRNONAA >60   < > 55* >60 >60   ANIONGAP 15   < > 13 9 11    < > = values in this interval not displayed.     Hematology Recent Labs  Lab 11/08/20 0131 11/09/20 1037 11/10/20 0029  WBC 27.8* 16.6* 15.2*  RBC 5.02 4.82 4.83  HGB 15.4 14.9 14.9  HCT 45.6 44.2 44.0  MCV 90.8 91.7 91.1  MCH 30.7 30.9 30.8  MCHC 33.8 33.7 33.9  RDW 13.5 13.5 13.4  PLT 215 202 206   HS Trop: 24 > 18968  Cardiac EnzymesNo results for input(s): TROPONINI in the last 168 hours. No results for input(s): TROPIPOC in the last 168 hours.   BNPNo results for input(s): BNP, PROBNP in the last 168 hours.   DDimer No results for input(s): DDIMER in the last 168 hours.   Lipid Panel     Component Value Date/Time   CHOL 164 11/06/2020 1801   CHOL 210 (H) 09/08/2019 1506   TRIG 123 11/06/2020 1801   HDL 50 11/06/2020 1801   HDL 48 09/08/2019 1506   CHOLHDL 3.3 11/06/2020 1801   VLDL 25 11/06/2020 1801   LDLCALC 89 11/06/2020 1801   LDLCALC 145 (H) 09/08/2019 1506     Radiology    No results found.  Cardiac Studies   CATH/PCI: 11/06/2020     Prox LAD to Mid LAD lesion is 85% stenosed.   Prox LAD lesion is 100% stenosed.   Dist Cx lesion is 100% stenosed.   Prox RCA to Dist RCA lesion is 100% stenosed.   Ost 1st Diag lesion is 70% stenosed.   Ost 2nd Diag to 2nd Diag lesion is 70% stenosed.   Ost 3rd Mrg lesion is 100% stenosed.   Mid Graft to Dist Graft lesion before 1st Mrg  is 100% stenosed.   Ost LAD to Prox LAD lesion is 100% stenosed.   Ost Cx to Dist Cx lesion is 100% stenosed.   A drug-eluting stent was successfully placed using a STENT ONYX FRONTIER 3.5X30.   Post intervention, there is a 0% residual stenosis.   SVG and is large.   There is  mild left ventricular systolic dysfunction.   LV end diastolic pressure is severely elevated.   The left ventricular ejection fraction is 45-50% by visual estimate.   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 Patent LIMA to the LAD Patent SVG to the PDA Occluded  SVG sequentially to OM1 and OM2 Mild LV dysfunction. EF 45%.  Elevated LVEDP 30 mm Hg Successful PCI of the SVG to OM1 and OM2 with aspiration thrombectomy and DES x 1   Plan: DAPT for one year. Continue pressor support- wean as tolerated. Angiomax discontinued- plan to remove sheath in 2 hours.    Intervention    ECHO 11/07/2020  Left ventricular ejection fraction, by estimation, is 35 to 40%. The left ventricle has moderately decreased function. The left ventricle demonstrates regional wall motion abnormalities (see scoring diagram/findings for description). The left ventricular internal cavity size was moderately dilated. Left ventricular diastolic parameters are consistent with Grade II diastolic dysfunction (pseudonormalization). Elevated left ventricular end-diastolic pressure. 1. Right ventricular systolic function is normal. The right ventricular size is normal. There is normal pulmonary artery systolic pressure. 2. 3. Left atrial size was moderately dilated. The mitral valve is degenerative. Mild mitral valve regurgitation. No evidence of mitral stenosis. Moderate mitral annular calcification. 4. 5. Tricuspid valve regurgitation is moderate. The aortic valve is tricuspid. Aortic valve regurgitation are very visualized. Moderate sclerosis without stenosis especially non coronary cusp. 6. The inferior vena cava is normal in size with greater than 50% respiratory variability, suggesting right atrial pressure of 3 mmHg.   Patient Profile     80 y.o. male who is  s/p CABG X4 in 2009,GERD. PAD, HLD, T2DM who suffered a STEM due to SVG to acute marginal jump graft occlusion successfully stented/thrombectomy  Assessment & Plan    1.Day  4 s/p STEMI due to occluded [facility] sequentially supplying OM1-2.  Status post thrombectomy and DES stent to SVG.  Now on DAPT with aspirin/Brilinta.  He had occasional PVCs which have improved with initiation of beta-blocker therapy.  No  recurrent chest pain, no ectopy.  2.  Ischemic cardiomyopathy: EF on echo Doppler 35 to 40%.  Yesterday, patient was started on low-dose losartan 12.5 mg and continued to be on metoprolol tartrate 12.5 mg for his ventricular ectopy which had stabilized.  He received a dose of furosemide 20 mg.  Urine output was not significant.  With his reduced LV function, yesterday losartan was increased to 12.5 twice daily, spironolactone 12.5 mg daily added, and today have transition from metoprolol to tartrate to metoprolol succinate 25 mg.  Blood pressure now is 101/70.  In the future if LV function remains reduced, as an outpatient transition from [facility] to [facility] and consider SGLT2 inhibition  3.  Hyperlipidemia: He is now on increased atorvastatin 40 mg from previous 20 mg dose as well as Zetia 10 mg.  Target LDL less than 70.  LDL was 89.  Recommend follow-up lipid panel as outpatient and if he does not reach target attemp titration of atorvastatin to 80 mg daily.  4.  Leukocytosis: White blood count on September 26 had increased to 27.8, improved to 16.6 yesterday and today 15.2.  Patient is afebrile.  He is no longer coughing.  Neutrophil count is 69.  We will plan to follow-up chest x-ray today to see if there is improvement in his previous right upper lobe consolidation and atelectasis    5.  PVD: Status post left iliac stent with bilateral SFA occlusion followed by Dr.  Arida in addition to Dr. Johnsie Cancel  6.  Mild carotid disease  7. GERD on PPI  8.  Diabetes mellitus: Currently on insulin; may be a good candidate for future SGT2 inhibition particularly if LV function remains reduced. Pt had a low BS yesterday to 79, will decrease lantus insulin from 18 to 15 units.  Patient has been seen by cardiac rehab.  Per recommendations will obtain PT evaluation  to ensure safety before possible discharge later today  if remains stable  Signed, [name] Sine, MD, [facility] 11/10/2020, 8:44 AM

## 2020-11-10 NOTE — Progress Notes (Signed)
Spoke with Mrs. Bettie Misenheimer (wife).  Updated her regarding confusion status being somewhat improved.  Will continue to monitor and reassess for changes.   Patient was concerned one of his hearing aids were lost.  Per patient's wife "he only has 1 hearing aid at the hospital"  "He lost the other one".  Will inform patient of conversation with wife.  Currently 1 hearing aid is in his left ear and charger is present.

## 2020-11-10 NOTE — Progress Notes (Signed)
Will defer ambulation to PT today. Spoke with pt briefly and noted pleasantly confused today. Yves Dill CES, ACSM 2:18 PM 11/10/2020

## 2020-11-11 LAB — GLUCOSE, CAPILLARY
Glucose-Capillary: 86 mg/dL (ref 70–99)
Glucose-Capillary: 97 mg/dL (ref 70–99)

## 2020-11-11 NOTE — Progress Notes (Signed)
Occupational Therapy Treatment Patient Details Name: Ricardo Le MRN: [id number] DOB: 22-Dec-1940 Today's Date: 11/11/2020   History of present illness Pt is a 80 y/o male admitted secondary to chest pain. Found to have STEMI and is s/p thrombectomy and DES stent to SVG. PMH includes DM, HTN, CAD s/p CABG.   OT comments  Pt back to baseline in cognition. Supervised for ambulation with RW to bathroom for toileting and grooming at sink. Pt able to access feet for bathing and dressing. Plans to continue to use RW until he feels safe to progress back to his cane.    Recommendations for follow up therapy are one component of a multi-disciplinary discharge planning process, led by the attending physician.  Recommendations may be updated based on patient status, additional functional criteria and insurance authorization.    Follow Up Recommendations  No OT follow up    Equipment Recommendations  None recommended by OT    Recommendations for Other Services      Precautions / Restrictions Precautions Precautions: Fall       Mobility Bed Mobility               General bed mobility comments: in chair    Transfers Overall transfer level: Needs assistance Equipment used: Rolling walker (2 wheeled) Transfers: Sit to/from Stand Sit to Stand: Supervision         General transfer comment: good technique    Balance Overall balance assessment: Needs assistance   Sitting balance-Leahy Scale: Good Sitting balance - Comments: no LOB donning socks   Standing balance support: Bilateral upper extremity supported Standing balance-Leahy Scale: Fair Standing balance comment: stabilizes with one hand on sink,agrees he is safer with RW                           ADL either performed or assessed with clinical judgement   ADL Overall ADL's : Needs assistance/impaired Eating/Feeding: Independent;Sitting   Grooming: Oral care;Wash/dry hands;Standing;Supervision/safety        Lower Body Bathing: Supervison/ safety;Sit to/from stand       Lower Body Dressing: Supervision/safety;Sit to/from stand Lower Body Dressing Details (indicate cue type and reason): donned and doffed socks without difficulty Toilet Transfer: Supervision/safety;Ambulation;Regular Toilet;RW   Toileting- Clothing Manipulation and Hygiene: Supervision/safety;Sit to/from stand       Functional mobility during ADLs: Supervision/safety;Rolling walker       Vision       Perception     Praxis      Cognition Arousal/Alertness: Awake/alert Behavior During Therapy: WFL for tasks assessed/performed Overall Cognitive Status: Within Functional Limits for tasks assessed                                          Exercises     Shoulder Instructions       General Comments      Pertinent Vitals/ Pain       Pain Assessment: No/denies pain  Home Living                                          Prior Functioning/Environment              Frequency  Min 2X/week        Progress Toward Goals  OT Goals(current goals can now be found in the care plan section)  Progress towards OT goals: Goals met/education completed, patient discharged from OT  Acute Rehab OT Goals Patient Stated Goal: for pt to get better per wife OT Goal Formulation: Patient unable to participate in goal setting Time For Goal Achievement: 11/24/20 Potential to Achieve Goals: Good  Plan Discharge plan needs to be updated    Co-evaluation                 AM-PAC OT "6 Clicks" Daily Activity     Outcome Measure   Help from another person eating meals?: None Help from another person taking care of personal grooming?: A Little Help from another person toileting, which includes using toliet, bedpan, or urinal?: A Little Help from another person bathing (including washing, rinsing, drying)?: A Little Help from another person to put on and taking off regular  upper body clothing?: None Help from another person to put on and taking off regular lower body clothing?: A Little 6 Click Score: 20    End of Session Equipment Utilized During Treatment: Gait belt;Rolling walker  OT Visit Diagnosis: Unsteadiness on feet (R26.81);Other abnormalities of gait and mobility (R26.89);Muscle weakness (generalized) (M62.81);Other symptoms and signs involving cognitive function   Activity Tolerance Patient tolerated treatment well   Patient Left in chair;with call bell/phone within reach;with chair alarm set;with nursing/sitter in room   Nurse Communication          Time: 1116-1130 OT Time Calculation (min): 14 min  Charges: OT General Charges $OT Visit: 1 Visit OT Treatments $Self Care/Home Management : 8-22 mins  Ricardo Le, OTR/L Acute Rehabilitation Services Pager: [phone] Office: [phone]   Ricardo Le 11/11/2020, 12:18 PM

## 2020-11-11 NOTE — Progress Notes (Signed)
Progress Note  Patient Name: Ricardo Le Date of Encounter: 11/11/2020  Primary Cardiologist: Dr. Johnsie Cancel  Subjective   Confusion has resolved probably contributed by sleep deprivation; feels well, ready for dc  Inpatient Medications    Scheduled Meds:  aspirin EC  81 mg Oral Daily   atorvastatin  40 mg Oral Daily   bethanechol  25 mg Oral BID   Chlorhexidine Gluconate Cloth  6 each Topical Q0600   dutasteride  0.5 mg Oral Daily   ezetimibe  10 mg Oral Daily   gabapentin  300 mg Oral TID   glimepiride  4 mg Oral Daily   heparin  5,000 Units Subcutaneous Q8H   insulin aspart  0-15 Units Subcutaneous TID WC   insulin aspart  0-5 Units Subcutaneous QHS   insulin aspart  6 Units Subcutaneous TID WC   insulin glargine-yfgn  15 Units Subcutaneous QHS   losartan  12.5 mg Oral BID   mouth rinse  15 mL Mouth Rinse BID   metoprolol succinate  25 mg Oral Daily   pantoprazole  40 mg Oral Daily   polyethylene glycol  17 g Oral Daily   sodium chloride flush  3 mL Intravenous Q12H   spironolactone  12.5 mg Oral Daily   tamsulosin  0.4 mg Oral QPC supper   ticagrelor  90 mg Oral BID   Continuous Infusions:  sodium chloride Stopped (11/06/20 1947)   sodium chloride     sodium chloride Stopped (11/07/20 0902)   PRN Meds: sodium chloride, acetaminophen, guaiFENesin-dextromethorphan, ipratropium-albuterol, magnesium hydroxide, ondansetron (ZOFRAN) IV, sodium chloride flush   Vital Signs    Vitals:   11/11/20 0400 11/11/20 0500 11/11/20 0655 11/11/20 0800  BP: 130/88 140/78  107/70  Pulse: 65 72  76  Resp: (!) 28 20  13   Temp:   98 F (36.7 C)   TempSrc:   Oral   SpO2: 95% 98%  93%  Weight:      Height:        Intake/Output Summary (Last 24 hours) at 11/11/2020 0834 Last data filed at 11/11/2020 0630 Gross per 24 hour  Intake 570 ml  Output 525 ml  Net 45 ml    I/O since admission: -[place]   11/06/20 1808 11/07/20 0600  Weight: 71.7 kg 73.1 kg     Telemetry    Sinus rhythm in the 70s - Personally Reviewed  ECG    11/09/2020 ECG (independently read by me): NSR at 69; Q III, aVF, nonspecific T changes,   11/07/2020 ECG (independently read by me): Sinus at 83, PVC, RBBB  Physical Exam   BP 107/70   Pulse 76   Temp 98 F (36.7 C) (Oral)   Resp 13   Ht 5\' 7"  (1.702 m)   Wt 73.1 kg   SpO2 93%   BMI 25.24 kg/m  General: Alert, oriented, no distress.  Skin: normal turgor, no rashes, warm and dry HEENT: Normocephalic, atraumatic. Pupils equal round and reactive to light; sclera anicteric; extraocular muscles intact;  Nose without nasal septal hypertrophy Mouth/Parynx benign; Mallinpatti scale 3 Neck: No JVD, no carotid bruits; normal carotid upstroke Lungs: improved aeration Chest wall: without tenderness to palpitation Heart: PMI not displaced, RRR, s1 s2 normal, 1/6 systolic murmur, no diastolic murmur, no rubs, gallops, thrills, or heaves Abdomen: soft, nontender; no hepatosplenomehaly, BS+; abdominal aorta nontender and not dilated by palpation. Back: no CVA tenderness Pulses 2+ Musculoskeletal: full range of motion, normal strength, no joint  deformities Extremities: no clubbing cyanosis or edema, Homan's sign negative  Neurologic: grossly nonfocal; Cranial nerves grossly wnl Psychologic: Normal mood and affect    Labs    Chemistry Recent Labs  Lab 11/06/20 1801 11/06/20 1909 11/08/20 0131 11/09/20 0038 11/10/20 0029  NA 137   < > 136 135 136  K 3.6   < > 4.1 4.0 3.6  CL 100   < > 95* 95* 101  CO2 22   < > 28 31 24   GLUCOSE 298*   < > 237* 119* 133*  BUN 18   < > 28* 34* 27*  CREATININE 0.95   < > 1.31* 1.14 1.07  CALCIUM 9.6   < > 9.2 8.9 9.1  PROT 7.9  --   --   --   --   ALBUMIN 3.8  --   --   --   --   AST 23  --   --   --   --   ALT 22  --   --   --   --   ALKPHOS 73  --   --   --   --   BILITOT 0.9  --   --   --   --   GFRNONAA >60   < > 55* >60 >60  ANIONGAP 15   < > 13 9 11    < > =  values in this interval not displayed.     Hematology Recent Labs  Lab 11/08/20 0131 11/09/20 1037 11/10/20 0029  WBC 27.8* 16.6* 15.2*  RBC 5.02 4.82 4.83  HGB 15.4 14.9 14.9  HCT 45.6 44.2 44.0  MCV 90.8 91.7 91.1  MCH 30.7 30.9 30.8  MCHC 33.8 33.7 33.9  RDW 13.5 13.5 13.4  PLT 215 202 206   HS Trop: 24 > 18968  Cardiac EnzymesNo results for input(s): TROPONINI in the last 168 hours. No results for input(s): TROPIPOC in the last 168 hours.   BNPNo results for input(s): BNP, PROBNP in the last 168 hours.   DDimer No results for input(s): DDIMER in the last 168 hours.   Lipid Panel     Component Value Date/Time   CHOL 164 11/06/2020 1801   CHOL 210 (H) 09/08/2019 1506   TRIG 123 11/06/2020 1801   HDL 50 11/06/2020 1801   HDL 48 09/08/2019 1506   CHOLHDL 3.3 11/06/2020 1801   VLDL 25 11/06/2020 1801   LDLCALC 89 11/06/2020 1801   LDLCALC 145 (H) 09/08/2019 1506     Radiology    DG Chest 2 View  Result Date: 11/10/2020 CLINICAL DATA:  History of palpitations, pneumonia.  Follow-up EXAM: CHEST - 2 VIEW COMPARISON:  11/07/2020 FINDINGS: Previously seen right upper lobe consolidation has nearly completely resolved. Low lung volumes with bibasilar atelectasis. Prior CABG. Heart is normal size. No effusions or pneumothorax. IMPRESSION: Near complete resolution of the right upper lobe consolidation. Low lung volumes, bibasilar atelectasis. Electronically Signed   By: Rolm Baptise M.D.   On: 11/10/2020 13:05    Cardiac Studies   CATH/PCI: 11/06/2020     Prox LAD to Mid LAD lesion is 85% stenosed.   Prox LAD lesion is 100% stenosed.   Dist Cx lesion is 100% stenosed.   Prox RCA to Dist RCA lesion is 100% stenosed.   Ost 1st Diag lesion is 70% stenosed.   Ost 2nd Diag to 2nd Diag lesion is 70% stenosed.   Ost 3rd Mrg lesion is 100% stenosed.   Mid  Graft to Dist Graft lesion before 1st Mrg  is 100% stenosed.   Ost LAD to Prox LAD lesion is 100% stenosed.   Ost Cx to  Dist Cx lesion is 100% stenosed.   A drug-eluting stent was successfully placed using a STENT ONYX FRONTIER 3.5X30.   Post intervention, there is a 0% residual stenosis.   SVG and is large.   There is mild left ventricular systolic dysfunction.   LV end diastolic pressure is severely elevated.   The left ventricular ejection fraction is 45-50% by visual estimate.   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 Patent LIMA to the LAD Patent SVG to the PDA Occluded SVG sequentially to OM1 and OM2 Mild LV dysfunction. EF 45%.  Elevated LVEDP 30 mm Hg Successful PCI of the SVG to OM1 and OM2 with aspiration thrombectomy and DES x 1   Plan: DAPT for one year. Continue pressor support- wean as tolerated. Angiomax discontinued- plan to remove sheath in 2 hours.    Intervention    ECHO 11/07/2020  Left ventricular ejection fraction, by estimation, is 35 to 40%. The left ventricle has moderately decreased function. The left ventricle demonstrates regional wall motion abnormalities (see scoring diagram/findings for description). The left ventricular internal cavity size was moderately dilated. Left ventricular diastolic parameters are consistent with Grade II diastolic dysfunction (pseudonormalization). Elevated left ventricular end-diastolic pressure. 1. Right ventricular systolic function is normal. The right ventricular size is normal. There is normal pulmonary artery systolic pressure. 2. 3. Left atrial size was moderately dilated. The mitral valve is degenerative. Mild mitral valve regurgitation. No evidence of mitral stenosis. Moderate mitral annular calcification. 4. 5. Tricuspid valve regurgitation is moderate. The aortic valve is tricuspid. Aortic valve regurgitation are very visualized. Moderate sclerosis without stenosis especially non coronary cusp. 6. The inferior vena cava is normal in size with greater than 50% respiratory variability, suggesting right atrial  pressure of 3 mmHg.   Patient Profile     80 y.o. male who is  s/p CABG X4 in 2009,GERD. PAD, HLD, T2DM who suffered a STEM due to SVG to acute marginal jump graft occlusion successfully stented/thrombectomy  Assessment & Plan    1.Day  5 s/p STEMI due to occluded [facility] sequentially supplying OM1-2.  Status post thrombectomy and DES stent to SVG.  Now on DAPT with aspirin/Brilinta.  He had occasional PVCs which have resolved with initiation of beta-blocker therapy.  No recurrent chest pain, no ectopy.  2.  Ischemic cardiomyopathy: EF on echo Doppler 35 to 40%.  Yesterday, patient was started on low-dose losartan 12.5 mg and continued to be on metoprolol tartrate 12.5 mg for his ventricular ectopy which had stabilized.  He received a dose of furosemide 20 mg.  Urine output was not significant.  With his reduced LV function,  losartan was increased to 12.5 twice daily, spironolactone 12.5 mg daily added, and he was transitioned to metoprolol succinate at 25 mg.  BP stable;  In the future if LV function remains reduced, as an outpatient transition from [facility] to [facility] and consider SGLT2 inhibition  3.  Hyperlipidemia: He is now on increased atorvastatin 40 mg from previous 20 mg dose as well as Zetia 10 mg.  Target LDL less than 70.  LDL was 89.  Recommend follow-up lipid panel as outpatient and if he does not reach target attemp titration of atorvastatin to 80 mg daily.  4.  Leukocytosis: White blood count on September 26 had increased to 27.8, improved to  15,2 yesterday. CXR with improved aeration.  5.  PVD: Status post left iliac stent with bilateral SFA occlusion followed by Dr. [name] in addition to Dr. Johnsie Cancel  6.  Mild carotid disease  7. GERD on PPI  8.  Diabetes mellitus: Currently on insulin; may be a good candidate for future SGT2 inhibition particularly if LV function remains reduced.   Seen by PT yesterday.   Pt is stable for DC today  Signed, [name] Sine, MD,  [employer] 11/11/2020, 8:34 AM

## 2020-11-11 NOTE — TOC Initial Note (Signed)
Transition of Care [facility]) - Initial/Assessment Note    Patient Details  Name: Ricardo Le MRN: [id number] Date of Birth: Jun 22, 1940  [facility] CM/SW Contact:    Bethena Roys, RN Phone Number: 11/11/2020, 12:54 PM  Clinical Narrative: Case Manager was able to speak with the patents spouse regarding home health services. Spouse was agreeable to having an in-network agency work with the patient. Case Manager spoke with [facility] and they will be able to service the patient within 24-48 hours post transition home. No durable medical equipment needed at this time. Wife to provide transportation home via private vehicle.   Expected Discharge Plan: [place] Barriers to Discharge: No Barriers Identified   Patient Goals and CMS Choice Patient states their goals for this hospitalization and ongoing recovery are:: to return home with home health services.   Choice offered to / list presented to : Spouse  Expected Discharge Plan and Services Expected Discharge Plan: [place] In-house Referral: NA Discharge Planning Services: CM Consult   Living arrangements for the past 2 months: Single Family Home Expected Discharge Date: 11/11/20               DME Arranged: N/A DME Agency: NA       HH Arranged: PT, OT HH Agency: [place] Date [facility] Agency Contacted: 11/11/20 Time HH Agency Contacted: 37 Representative spoke with at [place]: Tommi Rumps  Prior Living Arrangements/Services Living arrangements for the past 2 months: [place] Lives with:: Spouse Patient language and need for interpreter reviewed:: Yes Do you feel safe going back to the place where you live?: Yes      Need for Family Participation in Patient Care: Yes (Comment) Care giver support system in place?: Yes (comment)   Criminal Activity/Legal Involvement Pertinent to Current Situation/Hospitalization: No - Comment as needed  Activities  of Daily Living Home Assistive Devices/Equipment: Cane (specify quad or straight) (straight) ADL Screening (condition at time of admission) Patient's cognitive ability adequate to safely complete daily activities?: Yes Is the patient deaf or have difficulty hearing?: Yes Does the patient have difficulty seeing, even when wearing glasses/contacts?: No Does the patient have difficulty concentrating, remembering, or making decisions?: Yes Patient able to express need for assistance with ADLs?: Yes Does the patient have difficulty dressing or bathing?: No Independently performs ADLs?: Yes (appropriate for developmental age) Does the patient have difficulty walking or climbing stairs?: Yes Weakness of Legs: Both Weakness of Arms/Hands: None  Permission Sought/Granted Permission sought to share information with : Family Supports, Customer service manager, Case Optician, dispensing granted to share information with : Yes, Verbal Permission Granted     Permission granted to share info w AGENCY: [facility]        Emotional Assessment         Alcohol / Substance Use: Not Applicable Psych Involvement: No (comment)  Admission diagnosis:  ST elevation myocardial infarction (STEMI), unspecified artery ([place]) [I21.3] Acute ST elevation myocardial infarction (STEMI) of posterolateral wall ([place]) [I21.29] Patient Active Problem List   Diagnosis Date Noted   Acute ST elevation myocardial infarction (STEMI) of posterior wall ([place]) 11/06/2020   Acute ST elevation myocardial infarction (STEMI) of posterolateral wall ([place]) 11/06/2020   Epigastric pain 04/06/2020   Personal history of coronary artery disease 02/13/2020   Postural kyphosis 09/10/2019   Chronic mesenteric insufficiency ([place]) 05/16/2019   Chronic idiopathic constipation 04/23/2019   Gastroesophageal reflux disease without esophagitis 04/23/2019  Pulmonary embolism and infarction ([place]) 10/19/2018   DVT (deep venous thrombosis) ([place])  10/19/2018   Acute deep vein thrombosis (DVT) of left peroneal vein (HCC)    Pulmonary embolism (HCC)    DOE (dyspnea on exertion) 10/16/2018   Traumatic intraventricular hemorrhage ([place]) 07/23/2018   Intraventricular hemorrhage ([place]) 07/20/2018   Fracture of left acetabulum ([place]) 07/09/2018   Elevated troponin    Coronary artery disease involving native coronary artery of native heart with unstable angina pectoris ([place])    Chest pain 02/20/2017   Precordial chest pain    Angina pectoris ([place]) 02/19/2017   Atherosclerotic heart disease of native coronary artery with unstable angina pectoris ([place]) 03/08/2015   Hyperlipidemia 01/26/2015   Cervical spondylosis without myelopathy 07/28/2014   Cervical spondylosis 07/28/2014   Tension-type headache 05/28/2014   Constipation 05/19/2014   Pain 05/18/2014   Neck pain 03/25/2014   PAD (peripheral artery disease) ([place]) 03/20/2012   Unstable angina ([place]) 05/05/2011   DDD (degenerative disc disease)    HNP (herniated nucleus pulposus), lumbar    Pancreatic mass    Nephrolithiasis    Carotid bruit    S/P CABG x 4 03/08/2011   Bruit 07/06/2010   NONSPECIFIC ABN FINDING RAD & OTH EXAM GI TRACT 06/09/2009   Neuroendocrine carcinoma of pancreas ([place]) 06/02/2009   NAUSEA ALONE 05/28/2009   CHANGE IN BOWELS 05/28/2009   Elevated lipids 05/27/2009   Abdominal pain, generalized 05/27/2009   Type 2 diabetes mellitus with vascular disease ([place]) 12/16/2008   Essential hypertension 12/16/2008   BACK PAIN 12/16/2008   Palpitations 12/16/2008   PCP:  Angelina Sheriff, MD Pharmacy:   [facility] [place], [address] AT [address] [place] [address] Phone: [phone] Fax: [phone]  [facility] [address] [place] [address] Phone: [phone] Fax: [phone]     Social Determinants of Health (SDOH) Interventions    Readmission Risk  Interventions No flowsheet data found.

## 2020-11-11 NOTE — Progress Notes (Signed)
Physical Therapy Treatment Patient Details Name: Ricardo Le MRN: [id number] DOB: August 17, 1940 Today's Date: 11/11/2020   History of Present Illness Pt is a 80 y/o male admitted secondary to chest pain. Found to have STEMI and is s/p thrombectomy and DES stent to SVG. PMH includes DM, HTN, CAD s/p CABG.    PT Comments    The pt demos great progress both with mobility and activity tolerance, this session as well as with cognition and awareness. The pt was able to complete sit-stands from various surfaces with minA without AD and supervision with use of RW. He was able to progress to 370 ft ambulation with RW, but does need continued cues for posture, positioning in RW, and maintaining head up. The pt will be safe to return home with family support, but continue to recommend HHPT to progress endurance as well as to improve stability so pt can progress back to use of cane rather than RW.     Recommendations for follow up therapy are one component of a multi-disciplinary discharge planning process, led by the attending physician.  Recommendations may be updated based on patient status, additional functional criteria and insurance authorization.  Follow Up Recommendations  Home health PT;Supervision for mobility/OOB     Equipment Recommendations  None recommended by PT    Recommendations for Other Services       Precautions / Restrictions Precautions Precautions: Fall Restrictions Weight Bearing Restrictions: No     Mobility  Bed Mobility Overal bed mobility: Needs Assistance Bed Mobility: Supine to Sit     Supine to sit: Supervision     General bed mobility comments: supervision to complete due to lines, pt needing slightly increased time    Transfers Overall transfer level: Needs assistance Equipment used: Rolling walker (2 wheeled);1 person hand held assist Transfers: Sit to/from Stand Sit to Stand: Min assist;Min guard         General transfer comment: minA with  HHA to steady, supervision with RW with good stability  Ambulation/Gait Ambulation/Gait assistance: Min assist Gait Distance (Feet): 370 Feet Assistive device: Rolling walker (2 wheeled) Gait Pattern/deviations: Step-through pattern;Decreased stride length;Trunk flexed Gait velocity: 0.3 m/s Gait velocity interpretation: <1.31 ft/sec, indicative of household ambulator General Gait Details: pt needing at least minA with HHA to steady, minG with RW with consistent cues for posture and positioning in RW      Balance Overall balance assessment: Needs assistance Sitting-balance support: No upper extremity supported;Feet supported Sitting balance-Leahy Scale: Good Sitting balance - Comments: no LOB donning socks   Standing balance support: Bilateral upper extremity supported Standing balance-Leahy Scale: Fair Standing balance comment: able to ambulate with single UE support, but needing BUE support to complete without assist                            Cognition Arousal/Alertness: Awake/alert Behavior During Therapy: WFL for tasks assessed/performed Overall Cognitive Status: Within Functional Limits for tasks assessed                                 General Comments: pt able to follow all instructions well, reports he now feels more like his normal self, oriented. does need repeated cues at times      Exercises      General Comments General comments (skin integrity, edema, etc.): VSS on RA      Pertinent Vitals/Pain Pain Assessment: No/denies  pain     PT Goals (current goals can now be found in the care plan section) Acute Rehab PT Goals Patient Stated Goal: for pt to get better per wife PT Goal Formulation: With patient/family Time For Goal Achievement: 11/24/20 Potential to Achieve Goals: Fair Progress towards PT goals: Progressing toward goals    Frequency    Min 3X/week      PT Plan Current plan remains appropriate       AM-PAC PT  "6 Clicks" Mobility   Outcome Measure  Help needed turning from your back to your side while in a flat bed without using bedrails?: A Little Help needed moving from lying on your back to sitting on the side of a flat bed without using bedrails?: A Little Help needed moving to and from a bed to a chair (including a wheelchair)?: A Little Help needed standing up from a chair using your arms (e.g., wheelchair or bedside chair)?: A Little Help needed to walk in hospital room?: A Little Help needed climbing 3-5 steps with a railing? : A Little 6 Click Score: 18    End of Session Equipment Utilized During Treatment: Gait belt Activity Tolerance: Patient limited by fatigue Patient left: in chair;with call bell/phone within reach;with chair alarm set Nurse Communication: Mobility status PT Visit Diagnosis: Unsteadiness on feet (R26.81);Muscle weakness (generalized) (M62.81);Difficulty in walking, not elsewhere classified (R26.2)     Time: 2481-8590 PT Time Calculation (min) (ACUTE ONLY): 23 min  Charges:  $Gait Training: 8-22 mins $Therapeutic Activity: 8-22 mins                     [place] Car[place], PT, DPT   Acute Rehabilitation Department Pager #: [phone]   [name] 11/11/2020, 1:02 PM

## 2020-11-11 NOTE — Progress Notes (Signed)
Discharge teaching completed with patient and wife. All questions answered, understanding verbalized. No acute changes upon discharge. PIV removed and site WNL. Patient transported off unit via wheelchair by RN with personal belongings.

## 2020-11-11 NOTE — Discharge Instructions (Addendum)
Information about your medication: Brilinta (anti-platelet agent)  Generic Name (Brand): ticagrelor (Brilinta), twice daily medication  PURPOSE: You are taking this medication along with aspirin to lower your chance of having a heart attack, stroke, or blood clots in your heart stent. These can be fatal. Brilinta and aspirin help prevent platelets from sticking together and forming a clot that can block an artery or your stent.   Common SIDE EFFECTS you may experience include: bruising or bleeding more easily, shortness of breath  Do not stop taking BRILINTA without talking to the doctor who prescribes it for you. People who are treated with a stent and stop taking Brilinta too soon, have a higher risk of getting a blood clot in the stent, having a heart attack, or dying. If you stop Brilinta because of bleeding, or for other reasons, your risk of a heart attack or stroke may increase.   Avoid taking NSAID agents or anti-inflammatory medications such as ibuprofen, naproxen given increased bleed risk with plavix - can use acetaminophen (Tylenol) if needed for pain.  Tell all of your doctors and dentists that you are taking Brilinta. They should talk to the doctor who prescribed Brilinta for you before you have any surgery or invasive procedure.   Contact your health care provider if you experience: severe or uncontrollable bleeding, pink/red/brown urine, vomiting blood or vomit that looks like "coffee grounds", red or black stools (looks like tar), coughing up blood or blood clots ----------------------------------------------------------------------------------------------------------------------   For his heart failure - we are starting spironolactone, metoprolol XL, and losartan to help him live longer and do better. Losartan and spironolactone can lower his blood pressure - monitor for any dizziness and be careful getting up quickly. Metoprolol XL can lower his blood pressure and heart rate.    For his heart stents that were placed for his heart attack, he will be taking brilinta and aspirin to keep the platelets in his blood from sticking together and blocking the stent. Monitor for signs and symptoms of bleeding and alert your cardiologist if concerned.  For his cholesterol and heart health, we increased his atorvastatin from 20 mg to 40 mg to lower his cholesterol and also prevent disease in his heart which can lower his risk for future heart attacks.

## 2020-11-12 DIAGNOSIS — K219 Gastro-esophageal reflux disease without esophagitis: Secondary | ICD-10-CM | POA: Diagnosis not present

## 2020-11-12 DIAGNOSIS — M479 Spondylosis, unspecified: Secondary | ICD-10-CM | POA: Diagnosis not present

## 2020-11-12 DIAGNOSIS — I10 Essential (primary) hypertension: Secondary | ICD-10-CM | POA: Diagnosis not present

## 2020-11-12 DIAGNOSIS — Z48812 Encounter for surgical aftercare following surgery on the circulatory system: Secondary | ICD-10-CM | POA: Diagnosis not present

## 2020-11-12 DIAGNOSIS — I2129 ST elevation (STEMI) myocardial infarction involving other sites: Secondary | ICD-10-CM | POA: Diagnosis not present

## 2020-11-12 DIAGNOSIS — N200 Calculus of kidney: Secondary | ICD-10-CM | POA: Diagnosis not present

## 2020-11-12 DIAGNOSIS — Z794 Long term (current) use of insulin: Secondary | ICD-10-CM | POA: Diagnosis not present

## 2020-11-12 DIAGNOSIS — H9190 Unspecified hearing loss, unspecified ear: Secondary | ICD-10-CM | POA: Diagnosis not present

## 2020-11-12 DIAGNOSIS — Z7984 Long term (current) use of oral hypoglycemic drugs: Secondary | ICD-10-CM | POA: Diagnosis not present

## 2020-11-12 DIAGNOSIS — N400 Enlarged prostate without lower urinary tract symptoms: Secondary | ICD-10-CM | POA: Diagnosis not present

## 2020-11-12 DIAGNOSIS — I083 Combined rheumatic disorders of mitral, aortic and tricuspid valves: Secondary | ICD-10-CM | POA: Diagnosis not present

## 2020-11-12 DIAGNOSIS — Z8673 Personal history of transient ischemic attack (TIA), and cerebral infarction without residual deficits: Secondary | ICD-10-CM | POA: Diagnosis not present

## 2020-11-12 DIAGNOSIS — E1151 Type 2 diabetes mellitus with diabetic peripheral angiopathy without gangrene: Secondary | ICD-10-CM | POA: Diagnosis not present

## 2020-11-12 DIAGNOSIS — I779 Disorder of arteries and arterioles, unspecified: Secondary | ICD-10-CM | POA: Diagnosis not present

## 2020-11-12 DIAGNOSIS — E7800 Pure hypercholesterolemia, unspecified: Secondary | ICD-10-CM | POA: Diagnosis not present

## 2020-11-12 DIAGNOSIS — I2699 Other pulmonary embolism without acute cor pulmonale: Secondary | ICD-10-CM | POA: Diagnosis not present

## 2020-11-15 ENCOUNTER — Telehealth: Payer: Self-pay | Admitting: Cardiovascular Disease

## 2020-11-15 MED ORDER — ATORVASTATIN CALCIUM 40 MG PO TABS: 80.0000 mg | ORAL_TABLET | Freq: Two times a day (BID) | ORAL | 0 refills | Status: DC

## 2020-11-15 MED ORDER — ATORVASTATIN CALCIUM 80 MG PO TABS: 80.0000 mg | ORAL_TABLET | Freq: Every day | ORAL | 1 refills | Status: DC

## 2020-11-15 MED ORDER — ATORVASTATIN CALCIUM 40 MG PO TABS: 40.0000 mg | ORAL_TABLET | Freq: Two times a day (BID) | ORAL | 0 refills | Status: DC

## 2020-11-15 NOTE — Telephone Encounter (Signed)
Pt c/o medication issue:  1. Name of Medication:  esomeprazole (NEXIUM) 40 MG capsule pantoprazole (PROTONIX) 40 MG tablet ezetimibe (ZETIA) 10 MG tablet  2. How are you currently taking this medication (dosage and times per day)? Not currently taking  3. Are you having a reaction (difficulty breathing--STAT)? no  4. What is your medication issue? Patient's wife states these medications were listed on his med list after he was discharged from the hospital. She states they were not given the medications so the patient has not been taking them. She would like to clarify if he is supposed to take the medications and if so they need to be sent [facility] in [place]. She states she is not sure if the nexium and protonix are the same thing.

## 2020-11-15 NOTE — Telephone Encounter (Addendum)
Spoke with the patient's wife regarding the discharge medications.  She is concerned regarding the price of Zetia. She will call her insurance company and find out the price of Zetia. I informed her that there may be a coupon available to her, if it ends up being expensive.  Until that time, preference is to try atorvastatin 80mg  total per day with atorvastatin 40mg  in the AM and atorvastatin 40mg  in the PM.   Allergies include myalgias with statins (Crestor) in the past, which we reviewed could occur on higher dose atorvastatin.   Pt is aware that, if leg cramps results, he will discontinue the atorvastatin and try the Zetia (if they find out that they can afford this medication).  She requested that this rx not be called into the pharmacy, as the pt has "plenty of the medication at this time."   He is currently taking both Protonix and Nexium. I advised that he only take the Protonix for now. ------------------------------  Summary: (1) Continue Protonix (2) Discontinue Nexium (2) Look into the price of Zetia. Call our office if expensive. (4) Take atorvastatin 40mg  in AM and 40mg  in PM (total of Lipitor 80mg  daily) until we know if Zetia is affordable. If atorvastatin 80mg  total is well tolerated, we will call in a refill at that time but hold off on calling in refills to the pharmacy for now.

## 2020-11-15 NOTE — Addendum Note (Signed)
Addended by: Marrianne Mood D on: 11/15/2020 02:16 PM   Modules accepted: Orders

## 2020-11-16 ENCOUNTER — Telehealth (HOSPITAL_COMMUNITY): Payer: Self-pay

## 2020-11-16 NOTE — Telephone Encounter (Signed)
Per phase I cardiac rehab, fax cardiac rehab referral to [facility] cardiac rehab.

## 2020-11-19 DIAGNOSIS — Z6824 Body mass index (BMI) 24.0-24.9, adult: Secondary | ICD-10-CM | POA: Diagnosis not present

## 2020-11-19 DIAGNOSIS — Z23 Encounter for immunization: Secondary | ICD-10-CM | POA: Diagnosis not present

## 2020-11-19 DIAGNOSIS — Z794 Long term (current) use of insulin: Secondary | ICD-10-CM | POA: Diagnosis not present

## 2020-11-19 DIAGNOSIS — E1165 Type 2 diabetes mellitus with hyperglycemia: Secondary | ICD-10-CM | POA: Diagnosis not present

## 2020-11-25 ENCOUNTER — Telehealth (HOSPITAL_COMMUNITY): Payer: Self-pay

## 2020-11-25 ENCOUNTER — Other Ambulatory Visit: Payer: Self-pay

## 2020-11-25 ENCOUNTER — Ambulatory Visit: Payer: Medicare Other | Admitting: Cardiovascular Disease

## 2020-11-25 ENCOUNTER — Other Ambulatory Visit (HOSPITAL_COMMUNITY): Payer: Self-pay

## 2020-11-25 ENCOUNTER — Encounter: Payer: Self-pay | Admitting: Cardiovascular Disease

## 2020-11-25 VITALS — BP 120/60 | HR 60 | Ht 66.0 in | Wt 149.5 lb

## 2020-11-25 DIAGNOSIS — I10 Essential (primary) hypertension: Secondary | ICD-10-CM | POA: Diagnosis not present

## 2020-11-25 DIAGNOSIS — E785 Hyperlipidemia, unspecified: Secondary | ICD-10-CM | POA: Diagnosis not present

## 2020-11-25 DIAGNOSIS — I25118 Atherosclerotic heart disease of native coronary artery with other forms of angina pectoris: Secondary | ICD-10-CM

## 2020-11-25 DIAGNOSIS — I739 Peripheral vascular disease, unspecified: Secondary | ICD-10-CM

## 2020-11-25 DIAGNOSIS — I5022 Chronic systolic (congestive) heart failure: Secondary | ICD-10-CM

## 2020-11-25 DIAGNOSIS — I779 Disorder of arteries and arterioles, unspecified: Secondary | ICD-10-CM

## 2020-11-25 MED ORDER — LOSARTAN POTASSIUM 25 MG PO TABS: 12.5000 mg | ORAL_TABLET | Freq: Two times a day (BID) | ORAL | 1 refills | Status: DC

## 2020-11-25 MED ORDER — TICAGRELOR 90 MG PO TABS: 90.0000 mg | ORAL_TABLET | Freq: Two times a day (BID) | ORAL | 1 refills | Status: DC

## 2020-11-25 MED ORDER — ATORVASTATIN CALCIUM 40 MG PO TABS: 40.0000 mg | ORAL_TABLET | Freq: Every day | ORAL | 1 refills | Status: DC

## 2020-11-25 MED ORDER — METOPROLOL SUCCINATE ER 25 MG PO TB24: 25.0000 mg | ORAL_TABLET | Freq: Every day | ORAL | 1 refills | Status: DC

## 2020-11-25 MED ORDER — SPIRONOLACTONE 25 MG PO TABS: 12.5000 mg | ORAL_TABLET | Freq: Every day | ORAL | 1 refills | Status: DC

## 2020-11-25 NOTE — Progress Notes (Signed)
Cardiology Office Note   Date:  11/25/2020   ID:  Ricardo Le, DOB 1940/04/27, MRN [id number]  PCP:  Angelina Sheriff, MD  Cardiologist:  Dr. Johnsie Cancel  Chief Complaint  Patient presents with   Other    F/u NSTEMI c/o chest pain. Meds reviewed verbally with pt.       History of Present Illness: Ricardo Le is a 80 y.o. male who presents for a follow up visit regarding PAD and coronary artery disease. He has known history of coronary artery disease status post coronary artery bypass grafting in 2009 .  Cardiac cath in 2017, 2019 and 2020 showed patent grafts with diffuse distal disease.  Other past medical history include remote history of pancreatic cancer which was treated successfully. He is status post left common iliac artery stent placement in March 2016. He is known to have bilateral SFA occlusion with 1 vessel runoff below the knee bilaterally. He had previous neck surgery in 2016 with improvement in his neuropathic pain. He was hospitalized in June, 2020 with small subarachnoid hemorrhage which did not require intervention. He was diagnosed with pulmonary embolism and right lower extremity DVT in September of last year and was placed on anticoagulation after being cleared by neurosurgery.  He took anticoagulation for few months and then it was discontinued.  He had recurrent issues with postprandial abdominal pain.  He was seen by vascular surgery at [facility] for possible chronic mesenteric ischemia.  His abdominal pain was occasionally postprandial but also worse with walking and partially relieved by nitroglycerin.  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  He did undergo mesenteric angiogram via a left brachial artery cutdown in April of 2021.  This showed no significant stenosis affecting the celiac artery or superior mesenteric artery.  He was hospitalized last month with inferior posterior ST elevation  myocardial infarction.  He underwent cardiac catheterization which showed patent grafts with exception of an occluded SVG to OM1 and OM 2.  He underwent successful PCI with aspiration thrombectomy and drug-eluting stent placement.  Echocardiogram showed an EF of 35 to 40%.  He has been doing reasonably well since hospital discharge.  He reports improvement in his abdominal pain and wonders if some of his abdominal pain was related to cardiac angina.  He has been taking his medications regularly.  Past Medical History:  Diagnosis Date   CAD (coronary artery disease)    a. 04/2007 s/p CABG x 4  - LIMA->LAD, VG->OM1->Om2, VG->PDA;  b. 12/2008 & 06/2010 Caths - Native multivessel dzs with 4/4 patent grafts. c. cath 03/09/2015 patent grafts, unchanged from prior cath.   Cancer [facility])    pancreatic  March 2011   Carotid bruit    a. 07/2010 U/S- 0-39% bilat ICA stenosis   Chest pain    Noncardiac probably related to reflux   DDD (degenerative disc disease)    several surgeries   Fracture acetabulum-closed ([place]) 07/08/2018   GERD (gastroesophageal reflux disease)    HNP (herniated nucleus pulposus), lumbar    a. L2-3, s/p laminotomy, microdiskectomy 02/2009   HOH (hard of hearing)    uses amplifiers    Nephrolithiasis    PAD (peripheral artery disease) ([place]) 04/2014   s/p L Common Iliac Stent   Palpitations    Pancreatic mass    a. 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   Pneumonia    hx of  pna  Pure hypercholesterolemia    statin intolerance   Type II or unspecified type diabetes mellitus without mention of complication, not stated as uncontrolled    insulin dependent   Unspecified essential hypertension     Past Surgical History:  Procedure Laterality Date   ABDOMINAL AORTAGRAM N/A 05/13/2014   Procedure: ABDOMINAL [name];  Surgeon: Wellington Hampshire, MD;  Location: [place] CATH LAB;  Service: Cardiovascular;  Laterality: N/A;   ANTERIOR CERVICAL DECOMP/DISCECTOMY FUSION  N/A 07/28/2014   Procedure: ANTERIOR CERVICAL DECOMPRESSION/DISCECTOMY FUSION CERVICAL FOUR-FIVE CERVICAL FIVE-SIX ;  Surgeon: Earnie Larsson, MD;  Location: [place] NEURO ORS;  Service: Neurosurgery;  Laterality: N/A;   APPENDECTOMY     arthroscopy right knee     BACK SURGERY     CARDIAC CATHETERIZATION N/A 03/09/2015   Procedure: Left Heart Cath and Cors/Grafts Angiography;  Surgeon: [name] Sine, MD;  Location: [place] CV LAB;  Service: Cardiovascular;  Laterality: N/A;   CHOLECYSTECTOMY     CORONARY ARTERY BYPASS GRAFT     X 4 2009 (LIMA to LAD, SVG to first cicumflex marginal branch with sequential SVG to second cicumflex marginal branch  and saphenous vein to posterior descending coronary artery with endoscopic,vein harvest rt. lower exttremity by Dr.Owen on March 31,2009   CORONARY/GRAFT ACUTE MI REVASCULARIZATION N/A 11/06/2020   Procedure: Coronary/Graft Acute MI Revascularization;  Surgeon: Martinique, Peter M, MD;  Location: [place] CV LAB;  Service: Cardiovascular;  Laterality: N/A;   CYST REMOVED     FROM SPINE   CYSTOURETHROSCOPY     right retrograde pyelogram,manipulate stone in the renal pelvis, rt. double-j catheter.   EYE SURGERY     KNEE SURGERY     rt knee   LEFT HEART CATH AND CORONARY ANGIOGRAPHY N/A 11/06/2020   Procedure: LEFT HEART CATH AND CORONARY ANGIOGRAPHY;  Surgeon: Martinique, Peter M, MD;  Location: [place] CV LAB;  Service: Cardiovascular;  Laterality: N/A;   LEFT HEART CATH AND CORS/GRAFTS ANGIOGRAPHY N/A 02/20/2017   Procedure: LEFT HEART CATH AND CORS/GRAFTS ANGIOGRAPHY;  Surgeon: Leonie Man, MD;  Location: [place] CV LAB;  Service: Cardiovascular;  Laterality: N/A;   LEFT HEART CATH AND CORS/GRAFTS ANGIOGRAPHY N/A 05/28/2018   Procedure: LEFT HEART CATH AND CORS/GRAFTS ANGIOGRAPHY;  Surgeon: Burnell Blanks, MD;  Location: [place] CV LAB;  Service: Cardiovascular;  Laterality: N/A;   NEPHROLITHOTOMY     PERCUTANEOUS   PARS PLANA VITRECTOMY      lt. eye,retinal photocoagulation lt. eye, membrane peel lt. eye.   PERCUTANEOUS STENT INTERVENTION Left 05/13/2014   Procedure: PERCUTANEOUS STENT INTERVENTION;  Surgeon: Wellington Hampshire, MD;  Location: [place] CATH LAB;  Service: Cardiovascular;  Laterality: Left;  COMMON ILLIAC   re-exploratin of laminectomy  05/2008   (RT L2-3) WITH REDO MICRODISKECTOMY     Current Outpatient Medications  Medication Sig Dispense Refill   acetaminophen (TYLENOL) 325 MG tablet Take 650 mg by mouth every 6 (six) hours as needed (for pain).      aspirin EC 81 MG tablet Take 81 mg by mouth daily.     atorvastatin (LIPITOR) 40 MG tablet Take 1 tablet (40 mg total) by mouth 2 (two) times daily. 60 tablet 0   BD PEN NEEDLE NANO 2ND GEN 32G X 4 MM MISC 1 each by Other route daily.     bethanechol (URECHOLINE) 25 MG tablet Take 25 mg by mouth 2 (two) times daily.     bisacodyl (DULCOLAX) 5 MG EC tablet Take 5 mg  by mouth every 3 (three) days.      Blood Glucose Monitoring Suppl ([place]) w/Device KIT 1 each by Other route as directed.     dutasteride (AVODART) 0.5 MG capsule Take 0.5 mg by mouth daily.     gabapentin (NEURONTIN) 300 MG capsule Take 300 mg by mouth 3 (three) times daily.     glimepiride (AMARYL) 2 MG tablet Take 4 mg by mouth daily.     Insulin Glargine (LANTUS) 100 UNIT/ML Solostar Pen Inject 18 Units into the skin at bedtime. 15 mL 11   losartan (COZAAR) 25 MG tablet Take 1/2 tablet (12.5 mg total) by mouth 2 (two) times daily. 45 tablet 1   Melatonin 3 MG TABS Take 1 tablet (3 mg total) by mouth at bedtime. 30 tablet 0   metoprolol succinate (TOPROL-XL) 25 MG 24 hr tablet Take 1 tablet (25 mg total) by mouth daily. 90 tablet 1   Multiple Vitamin (MULTIVITAMIN WITH MINERALS) TABS tablet Take 1 tablet by mouth daily.     nitroGLYCERIN (NITROSTAT) 0.4 MG SL tablet PLACE 1 TABLET UNDER THE TONGUE EVERY 5 MINUTES AS NEEDED FOR CHEST PAIN 25 tablet 3   ONETOUCH VERIO test strip 1 each  by Other route 2 (two) times daily.     pantoprazole (PROTONIX) 40 MG tablet Take 40 mg by mouth 2 (two) times daily.     spironolactone (ALDACTONE) 25 MG tablet Take 1/2 tablet (12.5 mg total) by mouth daily. 45 tablet 1   tamsulosin (FLOMAX) 0.4 MG CAPS capsule Take 1 capsule (0.4 mg total) by mouth daily after supper. 30 capsule 0   ticagrelor (BRILINTA) 90 MG TABS tablet Take 1 tablet (90 mg total) by mouth 2 (two) times daily. 180 tablet 2   No current facility-administered medications for this visit.    Allergies:   Dicyclomine, Iodinated diagnostic agents, Ioversol, Oxycodone hcl, Ranexa [ranolazine], Rosuvastatin calcium, Rosuvastatin calcium, Tramadol, Iohexol, and Statins    Social History:  The patient  reports that he has never smoked. He has never used smokeless tobacco. He reports that he does not drink alcohol and does not use drugs.   Family History:  The patient's family history includes Coronary artery disease in his brother and sister; Diabetes in his brother; Heart attack in his father; Other in his mother and another family member.    ROS:  Please see the history of present illness.   Otherwise, review of systems are positive for none.   All other systems are reviewed and negative.    PHYSICAL EXAM: VS:  BP 120/60 (BP Location: Left Arm, Patient Position: Sitting, Cuff Size: Normal)   Pulse 60   Ht $R'5\' 6"'mN$  (1.676 m)   Wt 149 lb 8 oz (67.8 kg)   SpO2 (!) 60%   BMI 24.13 kg/m  , BMI Body mass index is 24.13 kg/m. GEN: Well nourished, well developed, in no acute distress  HEENT: normal  Neck: no JVD, or masses. Bilateral carotid bruit.  Cardiac: RRR; no  rubs, or gallops,no edema . 2/6 SEM at the base.  Respiratory:  clear to auscultation bilaterally, normal work of breathing GI: soft, nontender, nondistended, + BS MS: no deformity or atrophy  Skin: warm and dry, no rash Neuro:  Strength and sensation are intact Psych: euthymic mood, full affect   EKG:  EKG is   ordered today. EKG showed sinus rhythm with left axis deviation lateral T wave changes suggestive of ischemia.  Recent Labs: 11/06/2020: ALT  22 11/07/2020: Magnesium 1.8 11/09/2020: TSH 2.314 11/10/2020: BUN 27; Creatinine, Ser 1.07; Hemoglobin 14.9; Platelets 206; Potassium 3.6; Sodium 136    Lipid Panel    Component Value Date/Time   CHOL 164 11/06/2020 1801   CHOL 210 (H) 09/08/2019 1506   TRIG 123 11/06/2020 1801   HDL 50 11/06/2020 1801   HDL 48 09/08/2019 1506   CHOLHDL 3.3 11/06/2020 1801   VLDL 25 11/06/2020 1801   LDLCALC 89 11/06/2020 1801   LDLCALC 145 (H) 09/08/2019 1506      Wt Readings from Last 3 Encounters:  11/25/20 149 lb 8 oz (67.8 kg)  11/07/20 161 lb 2.5 oz (73.1 kg)  03/19/20 156 lb 12.8 oz (71.1 kg)         ASSESSMENT AND PLAN:  1.  Peripheral arterial disease: Status post left iliac artery stenting. He is known to have bilateral SFA occlusion.  He has mild right leg claudication but overall stable and seems to be more limited by fatigue and exertional dyspnea.  2. Coronary artery disease involving native coronary arteries with recent myocardial infarction status post PCI and drug-eluting stent placement to SVG to OM.   Continue dual antiplatelet therapy with aspirin and ticagrelor for at least 1 year and preferably longer given his extensive cardiovascular disease.  3.  Chronic systolic heart failure due to ischemic cardiomyopathy: Moderately reduced ejection fraction.  The patient's medications were switched and he was started on losartan, spironolactone and Toprol.  Check basic metabolic profile today.  He appears to be euvolemic.  4. Hyperlipidemia: Continue treatment with atorvastatin with a target LDL of less than 70.  5. Essential hypertension: Blood pressure is reasonably controlled.  6. Bilateral carotid disease: Carotid Doppler in October showed mild nonobstructive disease.  7.  Chronic abdominal pain: Extensive work-up including  mesenteric angiogram which showed no obstructive disease.  Possible neuropathic mesenteric pain related to previous pancreatic surgery.  He reports that his symptoms are better than before.   Disposition:   FU with me in 4 months  Signed, Kathlyn Sacramento, MD  11/25/2020 3:21 PM    [place]

## 2020-11-25 NOTE — Patient Instructions (Signed)
Medication Instructions:  Your physician recommends that you continue on your current medications as directed. Please refer to the Current Medication list given to you today.   Your cardiac [facility]  *If you need a refill on your cardiac medications before your next appointment, please call your pharmacy*   Lab Work: Bmp today  If you have labs (blood work) drawn today and your tests are completely normal, you will receive your results only by: [place] (if you have MyChart) OR A paper copy in the mail If you have any lab test that is abnormal or we need to change your treatment, we will call you to review the results.   Testing/Procedures: None ordered   Follow-Up: At [facility], you and your health needs are our priority.  As part of our continuing mission to provide you with exceptional heart care, we have created designated Provider Care Teams.  These Care Teams include your primary Cardiologist (physician) and Advanced Practice Providers (APPs -  Physician Assistants and Nurse Practitioners) who all work together to provide you with the care you need, when you need it.  We recommend signing up for the patient portal called "MyChart".  Sign up information is provided on this After Visit Summary.  MyChart is used to connect with patients for Virtual Visits (Telemedicine).  Patients are able to view lab/test results, encounter notes, upcoming appointments, etc.  Non-urgent messages can be sent to your provider as well.   To learn more about what you can do with MyChart, go to [email or web address].    Your next appointment:   4 month(s)  The format for your next appointment:   In Person  Provider:   You may see Kathlyn Sacramento, MD or one of the following Advanced Practice Providers on your designated Care Team:   Murray Hodgkins, NP Christell Faith, PA-C Marrianne Mood, PA-C Cadence Kathlen Mody, [place]   Other  Instructions N/A

## 2020-11-25 NOTE — Telephone Encounter (Signed)
Pharmacy Transitions of Care Follow-up Telephone Call  Date of discharge: 11/11/20  Discharge Diagnosis: STEMI  How have you been since you were released from the hospital?  Spoke to spouse over the phone. Patient has been doing well since discharge. Patient has had some stomach pain since discharge and spouse wanted to discuss possibility of it being a new medications. Addressed possibility of the pain being from losartan, spironolactone, or metoprolol, but advised patient to go over this concern with MD at today's follow up. No other questions about meds at this time.  Medication changes made at discharge:     START taking: Brilinta (ticagrelor)  losartan (COZAAR)  metoprolol succinate (TOPROL-XL)  spironolactone (ALDACTONE)  CHANGE how you take: nitroGLYCERIN (NITROSTAT)  STOP taking: amLODipine 10 MG tablet (NORVASC)  atorvastatin 20 MG tablet (LIPITOR)  cephALEXin 500 MG capsule (KEFLEX)  diphenhydrAMINE 50 MG tablet (BENADRYL)  docusate sodium 100 MG capsule (COLACE)  glipiZIDE 5 MG tablet (GLUCOTROL)  isosorbide mononitrate 30 MG 24 hr tablet (IMDUR)  lisinopril-hydrochlorothiazide 20-12.5 MG tablet (ZESTORETIC)  nystatin cream (MYCOSTATIN)  senna 8.6 MG Tabs tablet (SENOKOT)  sucralfate 1 g tablet (CARAFATE)   Medication changes verified by the patient? Yes    Medication Accessibility:  Home Pharmacy[facility]  Was the patient provided with refills on discharged medications? Yes   Have all prescriptions been transferred from [facility] to home pharmacy?  [facility]. MD sending in new refills today.  Is the patient able to afford medications? Patient has insurance. Discussed pricing with patient. Kary Kos is $37 for 1 month and $115.43 for 3 months    Medication Review: TICAGRELOR (BRILINTA) Ticagrelor 90 mg BID initiated on 11/10/20.  - Educated patient on expected duration of therapy of aspirin with ticagrelor.  - Discussed  importance of taking medication around the same time every day, - Advised patient of medications to avoid (NSAIDs, aspirin maintenance doses>100 mg daily) - Educated that Tylenol (acetaminophen) will be the preferred analgesic to prevent risk of bleeding  - Emphasized importance of monitoring for signs and symptoms of bleeding (abnormal bruising, prolonged bleeding, nose bleeds, bleeding from gums, discolored urine, black tarry stools)  - Educated patient to notify doctor if shortness of breath or abnormal heartbeat occur - Advised patient to alert all providers of antiplatelet therapy prior to starting a new medication or having a procedure   Follow-up Appointments:  PCP Hospital f/u appt confirmed? None currently scheduled  Specialist Hospital f/u appt confirmed? Scheduled to see Dr. [name] on 11/25/20 @ 3:00pm (Cardiology).   If their condition worsens, is the pt aware to call PCP or go to the Emergency Dept.? yes  Final Patient Assessment: Patient has follow up scheduled and refills at home pharmacy

## 2020-11-26 LAB — BASIC METABOLIC PANEL
BUN/Creatinine Ratio: 19 (ref 10–24)
BUN: 20 mg/dL (ref 8–27)
CO2: 20 mmol/L (ref 20–29)
Calcium: 9.8 mg/dL (ref 8.6–10.2)
Chloride: 101 mmol/L (ref 96–106)
Creatinine, Ser: 1.07 mg/dL (ref 0.76–1.27)
Glucose: 215 mg/dL — ABNORMAL HIGH (ref 70–99)
Potassium: 4.6 mmol/L (ref 3.5–5.2)
Sodium: 140 mmol/L (ref 134–144)
eGFR: 71 mL/min/{1.73_m2} (ref >59)

## 2020-11-29 ENCOUNTER — Telehealth: Payer: Self-pay | Admitting: Cardiovascular Disease

## 2020-11-29 NOTE — Telephone Encounter (Signed)
Spoke with pt's wife Inez Catalina, [place] approved.  Pt seen in office by Dr. [name] 11/25/20 for NSTEMI follow up s/p PCI.  Wife states pt has "stomach trouble" off and on and had constipation this morning.  Pt takes Lactulose for constipation prescribed by GI. Pt also takes Gaviscon for reflux as needed otc.  Pt's wife had several questions that were answered no related to message.  Verified that pt does not have any s/s issues s/p cath procedure.  Denies shortness of breath, chest pain, weight gain, or swelling.  Advised that wife also does not give pt any of her Lasix.  Advised pt to contact GI if any further issues with constipation and Lactulose not helping. Last BM was yesterday per pt's wife.  Wife voiced understanding and has no further questions or concerns.

## 2020-11-29 NOTE — Telephone Encounter (Signed)
Pt c/o medication issue:  1. Name of Medication:  gaviscon for acid reflux and gas pain  Lasix for possible fluid in chest   2. How are you currently taking this medication (dosage and times per day)? Gaviscon is OTC and lasix is wifes rx   3. Are you having a reaction (difficulty breathing--STAT)? N/ a   4. What is your medication issue? Recent dc in the last 2weeks for MI per wife patient c/o pain in stomach and also she feels like he may have some fluid in his chest and wants to ask if he can take her lasix.

## 2020-11-30 NOTE — Addendum Note (Signed)
Addended by: Britt Bottom on: 11/30/2020 07:37 AM   Modules accepted: Orders

## 2020-12-03 ENCOUNTER — Telehealth: Payer: Self-pay | Admitting: Cardiovascular Disease

## 2020-12-03 NOTE — Telephone Encounter (Signed)
Pt c/o of Chest Pain: STAT if CP now or developed within 24 hours  1. Are you having CP right now? NO NOT RIGHT NOW  2. Are you experiencing any other symptoms (ex. SOB, nausea, vomiting, sweating)? NO TO ALL  3. How long have you been experiencing CP? PT HAS HAD A LITTLE CP SINCE HE HAD A STENT PLACED  4. Is your CP continuous or coming and going? COMING AND GOING  5. Have you taken Nitroglycerin? PT IS STILL HAVING  A LITTLE CP AFTER TAKING THE NITRO, WIFE WANTS TO KNOW IF THE PT IS STILL SUPPOSED TO FEEL A LITTLE CP AFTER HE TAKES THE NITRO? ?

## 2020-12-03 NOTE — Telephone Encounter (Signed)
Called patient back to let him know Dr. Johnsie Cancel agrees with earlier advise. Made patient a follow up appointment with Dr. Johnsie Cancel in March.

## 2020-12-03 NOTE — Telephone Encounter (Signed)
Called patient's wife (DPR) back about her message. Patient is complaining about a dull pain on the left lower chest that comes and goes with activity most of the time. Patient stated it is not as bad of a chest pain as it was before his recent cath. Patient stated it has been this way since heart cath, but it is just a little bit worse today. Patient took a nitro and it did not help. Encouraged patient to take nitro when he has the chest pain and wait 5 minutes and if it is not relieved then take another. Informed patient he can take up to 3 nitro 5 minutes apart, but if he is taking that last nitro and still having chest pain he needs to call 911.  Patient saw Dr. [name] last week. Informed patient that a message would be sent to Dr. Johnsie Cancel for further advisement.

## 2020-12-09 DIAGNOSIS — I10 Essential (primary) hypertension: Secondary | ICD-10-CM | POA: Diagnosis not present

## 2020-12-09 DIAGNOSIS — Z1331 Encounter for screening for depression: Secondary | ICD-10-CM | POA: Diagnosis not present

## 2020-12-09 DIAGNOSIS — Z6823 Body mass index (BMI) 23.0-23.9, adult: Secondary | ICD-10-CM | POA: Diagnosis not present

## 2020-12-09 DIAGNOSIS — E7800 Pure hypercholesterolemia, unspecified: Secondary | ICD-10-CM | POA: Diagnosis not present

## 2020-12-14 DIAGNOSIS — Z7984 Long term (current) use of oral hypoglycemic drugs: Secondary | ICD-10-CM | POA: Diagnosis not present

## 2020-12-14 DIAGNOSIS — H9190 Unspecified hearing loss, unspecified ear: Secondary | ICD-10-CM | POA: Diagnosis not present

## 2020-12-14 DIAGNOSIS — I10 Essential (primary) hypertension: Secondary | ICD-10-CM | POA: Diagnosis not present

## 2020-12-14 DIAGNOSIS — K219 Gastro-esophageal reflux disease without esophagitis: Secondary | ICD-10-CM | POA: Diagnosis not present

## 2020-12-14 DIAGNOSIS — I083 Combined rheumatic disorders of mitral, aortic and tricuspid valves: Secondary | ICD-10-CM | POA: Diagnosis not present

## 2020-12-14 DIAGNOSIS — E1151 Type 2 diabetes mellitus with diabetic peripheral angiopathy without gangrene: Secondary | ICD-10-CM | POA: Diagnosis not present

## 2020-12-14 DIAGNOSIS — Z8673 Personal history of transient ischemic attack (TIA), and cerebral infarction without residual deficits: Secondary | ICD-10-CM | POA: Diagnosis not present

## 2020-12-14 DIAGNOSIS — I779 Disorder of arteries and arterioles, unspecified: Secondary | ICD-10-CM | POA: Diagnosis not present

## 2020-12-14 DIAGNOSIS — M479 Spondylosis, unspecified: Secondary | ICD-10-CM | POA: Diagnosis not present

## 2020-12-14 DIAGNOSIS — Z794 Long term (current) use of insulin: Secondary | ICD-10-CM | POA: Diagnosis not present

## 2020-12-14 DIAGNOSIS — E7800 Pure hypercholesterolemia, unspecified: Secondary | ICD-10-CM | POA: Diagnosis not present

## 2020-12-14 DIAGNOSIS — N400 Enlarged prostate without lower urinary tract symptoms: Secondary | ICD-10-CM | POA: Diagnosis not present

## 2020-12-14 DIAGNOSIS — I2699 Other pulmonary embolism without acute cor pulmonale: Secondary | ICD-10-CM | POA: Diagnosis not present

## 2020-12-14 DIAGNOSIS — N200 Calculus of kidney: Secondary | ICD-10-CM | POA: Diagnosis not present

## 2020-12-14 DIAGNOSIS — Z48812 Encounter for surgical aftercare following surgery on the circulatory system: Secondary | ICD-10-CM | POA: Diagnosis not present

## 2020-12-14 DIAGNOSIS — I2129 ST elevation (STEMI) myocardial infarction involving other sites: Secondary | ICD-10-CM | POA: Diagnosis not present

## 2020-12-16 ENCOUNTER — Encounter (INDEPENDENT_AMBULATORY_CARE_PROVIDER_SITE_OTHER): Payer: Medicare Other | Admitting: Ophthalmology

## 2020-12-17 ENCOUNTER — Telehealth: Payer: Self-pay | Admitting: Cardiovascular Disease

## 2020-12-17 NOTE — Telephone Encounter (Signed)
Attempted to return call to Mr. Bedwell wife Inez Catalina (DPR approved). Unable to make contact at this time VM not set up to leave message.  Will r/t Dr. [name] primary nurse Lattie Haw to f/u on at later time.

## 2020-12-17 NOTE — Telephone Encounter (Signed)
Pt c/o BP issue: STAT if pt c/o blurred vision, one-sided weakness or slurred speech  1. What are your last 5 BP readings? 150'C-136'C systolic    2. Are you having any other symptoms (ex. Dizziness, headache, blurred vision, passed out)? No   3. What is your BP issue? After med changes at hospital in September noticed trending up    Previously took amlodipine and hctz but changed to losartan metoprolol and spironolactone

## 2020-12-20 NOTE — Telephone Encounter (Signed)
Patient calling with concerns of [place] medciation

## 2020-12-20 NOTE — Telephone Encounter (Signed)
Patient is seen by Dr. [name] for PAD only. Will route to his primary cardiologist Dr. Johnsie Cancel and his nurse Jeannene Patella, RN to address BP  and medication concerns.

## 2020-12-20 NOTE — Telephone Encounter (Signed)
Called patient's wife back. She stated patient's SBP (180's and 170's) has been high since medication changes from hospital. Patient was changed from lisinopril/HCTZ 20/12.5 mg, amlodipine 10 mg, and Imdur 30 mg to metoprolol 25 mg,  spirolactone 12.5 mg, and losartan 12.5 mg BID. Patient's wife stated she decided to give patient just losartan 25 mg instead of splitting dose and patient's BP has been great. Informed her that it should be fine to take it once daily since it's the same amount daily and it is hard for them to cut the pill in half. Patient's BP today 119/64 HR 61. Patient has had no issues with HR. HR staying in 60's. Will forward to Dr. Johnsie Cancel for further advisement.

## 2020-12-20 NOTE — Telephone Encounter (Signed)
Attempted to return the patients wife's call. Unable to lmom. Voicemail has not been set up. Will reiterate to the pt wife that Dr. Johnsie Cancel will need to address med concerns.

## 2020-12-21 MED ORDER — LOSARTAN POTASSIUM 25 MG PO TABS
25.0000 mg | ORAL_TABLET | Freq: Every day | ORAL | 3 refills | Status: DC
Start: 2020-12-21 — End: 2021-01-03

## 2020-12-21 NOTE — Addendum Note (Signed)
Addended by: Aris Georgia, Linde Wilensky L on: 12/21/2020 08:12 AM   Modules accepted: Orders

## 2020-12-21 NOTE — Telephone Encounter (Signed)
Called patient's wife back to let her know that Dr. Johnsie Cancel is fine with patient taking Losartan 25 mg daily. Will change medication list to show update.

## 2020-12-23 DIAGNOSIS — N390 Urinary tract infection, site not specified: Secondary | ICD-10-CM | POA: Diagnosis not present

## 2020-12-23 DIAGNOSIS — R972 Elevated prostate specific antigen [PSA]: Secondary | ICD-10-CM | POA: Diagnosis not present

## 2020-12-23 DIAGNOSIS — N401 Enlarged prostate with lower urinary tract symptoms: Secondary | ICD-10-CM | POA: Diagnosis not present

## 2020-12-23 DIAGNOSIS — R339 Retention of urine, unspecified: Secondary | ICD-10-CM | POA: Diagnosis not present

## 2021-01-03 ENCOUNTER — Telehealth: Payer: Self-pay | Admitting: Cardiovascular Disease

## 2021-01-03 DIAGNOSIS — I5022 Chronic systolic (congestive) heart failure: Secondary | ICD-10-CM

## 2021-01-03 DIAGNOSIS — Z79899 Other long term (current) drug therapy: Secondary | ICD-10-CM

## 2021-01-03 MED ORDER — ENTRESTO 49-51 MG PO TABS: 1.0000 | ORAL_TABLET | Freq: Two times a day (BID) | ORAL | 3 refills | Status: DC

## 2021-01-03 NOTE — Telephone Encounter (Addendum)
Ricardo Hector, MD  2:27 PM Stop cozaar and start entresto 49mg /51 mg f/u BMET in 2 weeks.  Gave the patient's wife (DPR) recommendations for medication changes and lab work. Verbalized agreement and understanding. Will continue to monitor BP readings.   Sent in new medication, placed lab orders and scheduled.

## 2021-01-03 NOTE — Telephone Encounter (Signed)
Pt c/o BP issue: STAT if pt c/o blurred vision, one-sided weakness or slurred speech  1. What are your last 5 BP readings? 177 on the top  2. Are you having any other symptoms (ex. Dizziness, headache, blurred vision, passed out)? no  3. What is your BP issue? Patient's wife states the patient's BP is still too high. She says his losartan was increased to 1 whole tablet daily. She says his BP will be okay during the day, but then the top number goes up to 177. She would like to know if he needs to increase to losartan again. She says she will be home until 2:30 pm and it is okay to leave a detailed voicemail.

## 2021-01-03 NOTE — Telephone Encounter (Signed)
All AM readings before medications.  Always takes in left arm.  AM 167/87 72  AM 177/76 62 AM 184/77 56 AM 182/75 60  PM 135/65 71 PM 148/70 56 PM 138/72 72 PM 134/68 67  This morning 01/03/21 AM 160/72 57, now 1 PM Left arm 179/77 65, Right arm 169/66 64.  The patient has not miss any doses of medication. Wife said he doesn't eat too much salt. Wife is wanting to know if the patient needs to increase blood pressure medications. Will forward to MD for advisement.

## 2021-01-04 ENCOUNTER — Telehealth: Payer: Self-pay | Admitting: *Deleted

## 2021-01-04 MED ORDER — LOSARTAN POTASSIUM 100 MG PO TABS: 100.0000 mg | ORAL_TABLET | Freq: Every day | ORAL | 3 refills | Status: DC

## 2021-01-04 NOTE — Telephone Encounter (Signed)
Patient's wife states entresto was too expensive and there is no way they can afford it. She would like to know if losartan can be increased instead.

## 2021-01-04 NOTE — Telephone Encounter (Signed)
Spoke with wife who is aware of Dr Kyla Balzarine order to increase Losartan to 100 mg per day.  The cost of Delene Loll was going to be $156/month.  Wife requests RX be sent into [facility] to be filled.  She state she has enough of the 25 mg tablets for him to take (4) until the 100 mg tablets come in the mail.  She will c/b if any further questions or concerns.

## 2021-01-04 NOTE — Telephone Encounter (Signed)
   SZ Patient's wife states entresto was too expensive and there is no way they can afford it. She would like to know if losartan can be increased instead.       Note     Will forward to Dr Johnsie Cancel for review and any new orders.  (Please return to [facility])

## 2021-01-13 DIAGNOSIS — R339 Retention of urine, unspecified: Secondary | ICD-10-CM | POA: Diagnosis not present

## 2021-01-13 DIAGNOSIS — N390 Urinary tract infection, site not specified: Secondary | ICD-10-CM | POA: Diagnosis not present

## 2021-01-13 DIAGNOSIS — R972 Elevated prostate specific antigen [PSA]: Secondary | ICD-10-CM | POA: Diagnosis not present

## 2021-01-13 DIAGNOSIS — N401 Enlarged prostate with lower urinary tract symptoms: Secondary | ICD-10-CM | POA: Diagnosis not present

## 2021-01-18 ENCOUNTER — Other Ambulatory Visit: Payer: Medicare Other

## 2021-01-18 ENCOUNTER — Telehealth: Payer: Self-pay | Admitting: Cardiovascular Disease

## 2021-01-18 MED ORDER — AMLODIPINE BESYLATE 10 MG PO TABS: 10.0000 mg | ORAL_TABLET | Freq: Every day | ORAL | 3 refills | Status: DC

## 2021-01-18 NOTE — Telephone Encounter (Signed)
Pt c/o BP issue: STAT if pt c/o blurred vision, one-sided weakness or slurred speech  1. What are your last 5 BP readings? 179/78  2. Are you having any other symptoms (ex. Dizziness, headache, blurred vision, passed out)? NO TO ALL  3. What is your BP issue? PT'S BP IS STILL RUNNING TOO HIGH. PT'S WIFE SAID ACCORDING TO GOOGLE, PT IS ALLERGIC TO LOSARTAN. PT IS CURRENTLY STILL IN THE BED SLEEPING, WIFE HAVE NOT TAKEN HIS BP TODAY BUT ACCORDING TO HER, SHE KNOWS HIS BP IS GOING TO BE HIGH.  PT IS SCHEDULED FOR LAB WORK TODAY  179/78 P 61 156/63 P 67  180/77 179/88 149/59

## 2021-01-18 NOTE — Telephone Encounter (Signed)
Spoke with the patient's wife and advised on Dr. Kyla Balzarine recommendations for the patient to start back on amlodipine 10 mg daily and follow up with his PCP in regards to his blood pressure. The patient's wife states that she wants to take the patient off of the losartan. I advised her that he should continue it along with the addition of amlodipine to see if blood pressure improves prior to discontinuing.

## 2021-01-18 NOTE — Telephone Encounter (Signed)
Spoke with the patient's wife who is concerned about losartan not working. She states that his blood pressure has remained elevated. Usually around 170s/70s. She is frustrated because they changed up all of his blood pressure medications when he was discharged from the hospital and now BP has remained elevated. She confirms that he has increased the losartan to 100 mg daily. She states that patient also complains of headaches, dizziness, and diarrhea. Patient is not awake yet so she does not have a current blood pressure reading. Wife has been advised on when and how to take patient's blood pressure to ensure accurate readings as well as avoiding sodium. Will send to Dr. Johnsie Cancel for additional recommendations.

## 2021-01-20 DIAGNOSIS — I259 Chronic ischemic heart disease, unspecified: Secondary | ICD-10-CM | POA: Diagnosis not present

## 2021-01-20 DIAGNOSIS — Z6824 Body mass index (BMI) 24.0-24.9, adult: Secondary | ICD-10-CM | POA: Diagnosis not present

## 2021-01-20 DIAGNOSIS — E538 Deficiency of other specified B group vitamins: Secondary | ICD-10-CM | POA: Diagnosis not present

## 2021-01-20 DIAGNOSIS — E7800 Pure hypercholesterolemia, unspecified: Secondary | ICD-10-CM | POA: Diagnosis not present

## 2021-01-20 DIAGNOSIS — R300 Dysuria: Secondary | ICD-10-CM | POA: Diagnosis not present

## 2021-01-21 ENCOUNTER — Ambulatory Visit: Payer: Medicare Other | Admitting: Sports Medicine

## 2021-02-25 ENCOUNTER — Other Ambulatory Visit: Payer: Self-pay

## 2021-02-25 ENCOUNTER — Encounter (INDEPENDENT_AMBULATORY_CARE_PROVIDER_SITE_OTHER): Payer: Medicare Other | Admitting: Ophthalmology

## 2021-02-25 DIAGNOSIS — H35033 Hypertensive retinopathy, bilateral: Secondary | ICD-10-CM

## 2021-02-25 DIAGNOSIS — I10 Essential (primary) hypertension: Secondary | ICD-10-CM | POA: Diagnosis not present

## 2021-02-25 DIAGNOSIS — E113593 Type 2 diabetes mellitus with proliferative diabetic retinopathy without macular edema, bilateral: Secondary | ICD-10-CM | POA: Diagnosis not present

## 2021-03-04 ENCOUNTER — Encounter: Payer: Self-pay | Admitting: Cardiovascular Disease

## 2021-03-04 ENCOUNTER — Other Ambulatory Visit: Payer: Self-pay

## 2021-03-04 ENCOUNTER — Ambulatory Visit: Payer: Medicare Other | Admitting: Cardiovascular Disease

## 2021-03-04 VITALS — BP 120/54 | HR 55 | Ht 66.0 in | Wt 156.0 lb

## 2021-03-04 DIAGNOSIS — I739 Peripheral vascular disease, unspecified: Secondary | ICD-10-CM | POA: Diagnosis not present

## 2021-03-04 DIAGNOSIS — I779 Disorder of arteries and arterioles, unspecified: Secondary | ICD-10-CM

## 2021-03-04 DIAGNOSIS — E785 Hyperlipidemia, unspecified: Secondary | ICD-10-CM

## 2021-03-04 DIAGNOSIS — I2510 Atherosclerotic heart disease of native coronary artery without angina pectoris: Secondary | ICD-10-CM

## 2021-03-04 DIAGNOSIS — I5022 Chronic systolic (congestive) heart failure: Secondary | ICD-10-CM | POA: Diagnosis not present

## 2021-03-04 DIAGNOSIS — I10 Essential (primary) hypertension: Secondary | ICD-10-CM

## 2021-03-04 MED ORDER — CLOPIDOGREL BISULFATE 75 MG PO TABS: 75.0000 mg | ORAL_TABLET | Freq: Every day | ORAL | 3 refills | Status: DC

## 2021-03-04 NOTE — Progress Notes (Signed)
Cardiology Office Note   Date:  03/04/2021   ID:  Ricardo Le, DOB May 28, 1940, MRN [id number]  PCP:  Angelina Sheriff, MD  Cardiologist:  Dr. Johnsie Cancel  Chief Complaint  Patient presents with   Other    4 Month f/u pt would like his carotid and lower extremity circulation. Meds reviewed verbally with pt.       History of Present Illness: Ricardo Le is a 81 y.o. male who presents for a follow up visit regarding PAD and coronary artery disease. He has known history of coronary artery disease status post coronary artery bypass grafting in 2009 .  Cardiac cath in 2017, 2019 and 2020 showed patent grafts with diffuse distal disease.  Other past medical history include remote history of pancreatic cancer which was treated successfully. He is status post left common iliac artery stent placement in March 2016. He is known to have bilateral SFA occlusion with 1 vessel runoff below the knee bilaterally. He had previous neck surgery in 2016 with improvement in his neuropathic pain. He was hospitalized in June, 2020 with small subarachnoid hemorrhage which did not require intervention. He was diagnosed with pulmonary embolism and right lower extremity DVT in September of last year and was placed on anticoagulation after being cleared by neurosurgery.  He took anticoagulation for few months and then it was discontinued.  He had recurrent issues with postprandial abdominal pain.  He was seen by vascular surgery at [facility] for possible chronic mesenteric ischemia.  His abdominal pain was occasionally postprandial but also worse with walking and partially relieved by nitroglycerin.  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  He did undergo mesenteric angiogram via a left brachial artery cutdown in April of 2021.  This showed no significant stenosis affecting the celiac artery or superior mesenteric artery.  He presented in September 2022  with inferior posterior ST elevation myocardial infarction.  He underwent cardiac catheterization which showed patent grafts with exception of an occluded SVG to OM1 and OM 2.  He underwent successful PCI with aspiration thrombectomy and drug-eluting stent placement.  Echocardiogram showed an EF of 35 to 40%.    The patient was having elevated blood pressure and thus was switched from losartan to amlodipine with improvement.  He reports feeling better on amlodipine and the losartan.  He continues to have chronic postprandial abdominal pain but his weight has been stable.  No chest pain or shortness of breath.  He does complain of increased bilateral leg claudication.  Past Medical History:  Diagnosis Date   CAD (coronary artery disease)    a. 04/2007 s/p CABG x 4  - LIMA->LAD, VG->OM1->Om2, VG->PDA;  b. 12/2008 & 06/2010 Caths - Native multivessel dzs with 4/4 patent grafts. c. cath 03/09/2015 patent grafts, unchanged from prior cath.   Cancer [facility])    pancreatic  March 2011   Carotid bruit    a. 07/2010 U/S- 0-39% bilat ICA stenosis   Chest pain    Noncardiac probably related to reflux   DDD (degenerative disc disease)    several surgeries   Fracture acetabulum-closed ([place]) 07/08/2018   GERD (gastroesophageal reflux disease)    HNP (herniated nucleus pulposus), lumbar    a. L2-3, s/p laminotomy, microdiskectomy 02/2009   HOH (hard of hearing)    uses amplifiers    Nephrolithiasis    PAD (peripheral artery disease) ([place]) 04/2014   s/p L Common Iliac Stent  Palpitations    Pancreatic mass    a. 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   Pneumonia    hx of  pna   Pure hypercholesterolemia    statin intolerance   Type II or unspecified type diabetes mellitus without mention of complication, not stated as uncontrolled    insulin dependent   Unspecified essential hypertension     Past Surgical History:  Procedure Laterality Date   ABDOMINAL AORTAGRAM N/A 05/13/2014    Procedure: ABDOMINAL [name];  Surgeon: Wellington Hampshire, MD;  Location: [place] CATH LAB;  Service: Cardiovascular;  Laterality: N/A;   ANTERIOR CERVICAL DECOMP/DISCECTOMY FUSION N/A 07/28/2014   Procedure: ANTERIOR CERVICAL DECOMPRESSION/DISCECTOMY FUSION CERVICAL FOUR-FIVE CERVICAL FIVE-SIX ;  Surgeon: Earnie Larsson, MD;  Location: [place] NEURO ORS;  Service: Neurosurgery;  Laterality: N/A;   APPENDECTOMY     arthroscopy right knee     BACK SURGERY     CARDIAC CATHETERIZATION N/A 03/09/2015   Procedure: Left Heart Cath and Cors/Grafts Angiography;  Surgeon: [name] Sine, MD;  Location: [place] CV LAB;  Service: Cardiovascular;  Laterality: N/A;   CHOLECYSTECTOMY     CORONARY ARTERY BYPASS GRAFT     X 4 2009 (LIMA to LAD, SVG to first cicumflex marginal branch with sequential SVG to second cicumflex marginal branch  and saphenous vein to posterior descending coronary artery with endoscopic,vein harvest rt. lower exttremity by Dr.Owen on March 31,2009   CORONARY/GRAFT ACUTE MI REVASCULARIZATION N/A 11/06/2020   Procedure: Coronary/Graft Acute MI Revascularization;  Surgeon: Martinique, Peter M, MD;  Location: [place] CV LAB;  Service: Cardiovascular;  Laterality: N/A;   CYST REMOVED     FROM SPINE   CYSTOURETHROSCOPY     right retrograde pyelogram,manipulate stone in the renal pelvis, rt. double-j catheter.   EYE SURGERY     KNEE SURGERY     rt knee   LEFT HEART CATH AND CORONARY ANGIOGRAPHY N/A 11/06/2020   Procedure: LEFT HEART CATH AND CORONARY ANGIOGRAPHY;  Surgeon: Martinique, Peter M, MD;  Location: [place] CV LAB;  Service: Cardiovascular;  Laterality: N/A;   LEFT HEART CATH AND CORS/GRAFTS ANGIOGRAPHY N/A 02/20/2017   Procedure: LEFT HEART CATH AND CORS/GRAFTS ANGIOGRAPHY;  Surgeon: Leonie Man, MD;  Location: [place] CV LAB;  Service: Cardiovascular;  Laterality: N/A;   LEFT HEART CATH AND CORS/GRAFTS ANGIOGRAPHY N/A 05/28/2018   Procedure: LEFT HEART CATH AND CORS/GRAFTS  ANGIOGRAPHY;  Surgeon: Burnell Blanks, MD;  Location: [place] CV LAB;  Service: Cardiovascular;  Laterality: N/A;   NEPHROLITHOTOMY     PERCUTANEOUS   PARS PLANA VITRECTOMY     lt. eye,retinal photocoagulation lt. eye, membrane peel lt. eye.   PERCUTANEOUS STENT INTERVENTION Left 05/13/2014   Procedure: PERCUTANEOUS STENT INTERVENTION;  Surgeon: Wellington Hampshire, MD;  Location: [place] CATH LAB;  Service: Cardiovascular;  Laterality: Left;  COMMON ILLIAC   re-exploratin of laminectomy  05/2008   (RT L2-3) WITH REDO MICRODISKECTOMY     Current Outpatient Medications  Medication Sig Dispense Refill   acetaminophen (TYLENOL) 325 MG tablet Take 650 mg by mouth every 6 (six) hours as needed (for pain).      amLODipine (NORVASC) 10 MG tablet Take 1 tablet (10 mg total) by mouth daily. 90 tablet 3   aspirin EC 81 MG tablet Take 81 mg by mouth daily.     atorvastatin (LIPITOR) 40 MG tablet Take 1 tablet (40 mg total) by mouth daily. 90 tablet 1   BD  PEN NEEDLE NANO 2ND GEN 32G X 4 MM MISC 1 each by Other route daily.     bethanechol (URECHOLINE) 25 MG tablet Take 25 mg by mouth 2 (two) times daily.     bisacodyl (DULCOLAX) 5 MG EC tablet Take 5 mg by mouth every 3 (three) days.      Blood Glucose Monitoring Suppl ([place]) w/Device KIT 1 each by Other route as directed.     dutasteride (AVODART) 0.5 MG capsule Take 0.5 mg by mouth daily.     gabapentin (NEURONTIN) 300 MG capsule Take 300 mg by mouth 3 (three) times daily.     glimepiride (AMARYL) 2 MG tablet Take 4 mg by mouth daily.     Insulin Glargine (LANTUS) 100 UNIT/ML Solostar Pen Inject 18 Units into the skin at bedtime. 15 mL 11   Melatonin 3 MG TABS Take 1 tablet (3 mg total) by mouth at bedtime. 30 tablet 0   metoprolol succinate (TOPROL-XL) 25 MG 24 hr tablet Take 1 tablet (25 mg total) by mouth daily. 90 tablet 1   Multiple Vitamin (MULTIVITAMIN WITH MINERALS) TABS tablet Take 1 tablet by mouth daily.      nitroGLYCERIN (NITROSTAT) 0.4 MG SL tablet PLACE 1 TABLET UNDER THE TONGUE EVERY 5 MINUTES AS NEEDED FOR CHEST PAIN 25 tablet 3   ONETOUCH VERIO test strip 1 each by Other route 2 (two) times daily.     pantoprazole (PROTONIX) 40 MG tablet Take 40 mg by mouth 2 (two) times daily.     spironolactone (ALDACTONE) 25 MG tablet Take 1/2 tablet (12.5 mg total) by mouth daily. 45 tablet 1   tamsulosin (FLOMAX) 0.4 MG CAPS capsule Take 1 capsule (0.4 mg total) by mouth daily after supper. 30 capsule 0   ticagrelor (BRILINTA) 90 MG TABS tablet Take 1 tablet (90 mg total) by mouth 2 (two) times daily. 180 tablet 1   No current facility-administered medications for this visit.    Allergies:   Dicyclomine, Iodinated contrast media, Ioversol, Oxycodone hcl, Ranexa [ranolazine], Rosuvastatin calcium, Rosuvastatin calcium, Tramadol, Iohexol, and Statins    Social History:  The patient  reports that he has never smoked. He has never used smokeless tobacco. He reports that he does not drink alcohol and does not use drugs.   Family History:  The patient's family history includes Coronary artery disease in his brother and sister; Diabetes in his brother; Heart attack in his father; Other in his mother and another family member.    ROS:  Please see the history of present illness.   Otherwise, review of systems are positive for none.   All other systems are reviewed and negative.    PHYSICAL EXAM: VS:  BP (!) 120/54 (BP Location: Left Arm, Patient Position: Sitting, Cuff Size: Normal)    Pulse (!) 55    Ht 5' 6" (1.676 m)    Wt 156 lb (70.8 kg)    SpO2 97%    BMI 25.18 kg/m  , BMI Body mass index is 25.18 kg/m. GEN: Well nourished, well developed, in no acute distress  HEENT: normal  Neck: no JVD, or masses. Bilateral carotid bruit.  Cardiac: RRR; no  rubs, or gallops,no edema . 2/6 SEM at the base.  Respiratory:  clear to auscultation bilaterally, normal work of breathing GI: soft, nontender, nondistended,  + BS MS: no deformity or atrophy  Skin: warm and dry, no rash Neuro:  Strength and sensation are intact Psych: euthymic mood, full affect  EKG:  EKG is not ordered today.    Recent Labs: 11/06/2020: ALT 22 11/07/2020: Magnesium 1.8 11/09/2020: TSH 2.314 11/10/2020: Hemoglobin 14.9; Platelets 206 11/25/2020: BUN 20; Creatinine, Ser 1.07; Potassium 4.6; Sodium 140    Lipid Panel    Component Value Date/Time   CHOL 164 11/06/2020 1801   CHOL 210 (H) 09/08/2019 1506   TRIG 123 11/06/2020 1801   HDL 50 11/06/2020 1801   HDL 48 09/08/2019 1506   CHOLHDL 3.3 11/06/2020 1801   VLDL 25 11/06/2020 1801   LDLCALC 89 11/06/2020 1801   LDLCALC 145 (H) 09/08/2019 1506      Wt Readings from Last 3 Encounters:  03/04/21 156 lb (70.8 kg)  11/25/20 149 lb 8 oz (67.8 kg)  11/07/20 161 lb 2.5 oz (73.1 kg)         ASSESSMENT AND PLAN:  1.  Peripheral arterial disease: Status post left iliac artery stenting. He is known to have bilateral SFA occlusion.  He reports worsening bilateral leg claudication.  I requested an ABI and aortoiliac duplex..  2. Coronary artery disease involving native coronary arteries with recent myocardial infarction status post PCI and drug-eluting stent placement to SVG to OM.   The patient and his wife are concerned about the cost of ticagrelor which will make him go into the donut hole very quickly.  I elected to switch him to clopidogrel 75 mg once daily once he is done with current supply of ticagrelor.  Continue aspirin indefinitely.  3.  Chronic systolic heart failure due to ischemic cardiomyopathy: Moderately reduced ejection fraction.  The patient was switched from losartan to amlodipine due to Poor control of blood pressure and he reports that he feels better with amlodipine.  Continue Toprol and spironolactone.  4. Hyperlipidemia: Continue treatment with atorvastatin with a target LDL of less than 70.  5. Essential hypertension: Blood pressure is  reasonably controlled.  6. Bilateral carotid disease: I requested a follow-up carotid Doppler.  7.  Chronic abdominal pain: Extensive work-up including mesenteric angiogram which showed no obstructive disease.  Possible neuropathic mesenteric pain related to previous pancreatic surgery.  This seems to be stable overall with no weight loss.   Disposition:   FU with me in 6 months  Signed, Kathlyn Sacramento, MD  03/04/2021 9:24 AM    [place]

## 2021-03-04 NOTE — Patient Instructions (Signed)
Medication Instructions:  Your physician has recommended you make the following change in your medication:   -Complete your supply of Brilinta  -Then START Plavix 75 mg daily. An Rx has been sent to [facility]   *If you need a refill on your cardiac medications before your next appointment, please call your pharmacy*   Lab Work: None ordered If you have labs (blood work) drawn today and your tests are completely normal, you will receive your results only by: [place] (if you have MyChart) OR A paper copy in the mail If you have any lab test that is abnormal or we need to change your treatment, we will call you to review the results.   Testing/Procedures: Your physician has requested that you have a carotid duplex. This test is an ultrasound of the carotid arteries in your neck. It looks at blood flow through these arteries that supply the brain with blood. Allow one hour for this exam. There are no restrictions or special instructions.  Your physician has requested that you have a aorta/ivc/iliac duplex  Your physician has requested that you have an ankle brachial index (ABI). During this test an ultrasound and blood pressure cuff are used to evaluate the arteries that supply the arms and legs with blood. Allow thirty minutes for this exam. There are no restrictions or special instructions.     Follow-Up: At [facility], you and your health needs are our priority.  As part of our continuing mission to provide you with exceptional heart care, we have created designated Provider Care Teams.  These Care Teams include your primary Cardiologist (physician) and Advanced Practice Providers (APPs -  Physician Assistants and Nurse Practitioners) who all work together to provide you with the care you need, when you need it.  We recommend signing up for the patient portal called "MyChart".  Sign up information is provided on this After Visit Summary.  MyChart is used to connect with  patients for Virtual Visits (Telemedicine).  Patients are able to view lab/test results, encounter notes, upcoming appointments, etc.  Non-urgent messages can be sent to your provider as well.   To learn more about what you can do with MyChart, go to [email or web address].    Your next appointment:   Your physician wants you to follow-up in: 6 months You will receive a reminder letter in the mail two months in advance. If you don't receive a letter, please call our office to schedule the follow-up appointment.   The format for your next appointment:   In Person  Provider:   You may see Kathlyn Sacramento, MD or one of the following Advanced Practice Providers on your designated Care Team:   Murray Hodgkins, NP Christell Faith, PA-C Cadence Kathlen Mody, PA-C{   Other Instructions N/A

## 2021-03-07 ENCOUNTER — Telehealth: Payer: Self-pay | Admitting: Cardiovascular Disease

## 2021-03-07 DIAGNOSIS — N390 Urinary tract infection, site not specified: Secondary | ICD-10-CM | POA: Diagnosis not present

## 2021-03-07 MED ORDER — CLOPIDOGREL BISULFATE 75 MG PO TABS: 75.0000 mg | ORAL_TABLET | Freq: Every day | ORAL | 0 refills | Status: DC

## 2021-03-07 NOTE — Telephone Encounter (Signed)
°*  STAT* If patient is at the pharmacy, call can be transferred to refill team.   1. Which medications need to be refilled? (please list name of each medication and dose if known) plavix 75 mg po q d   2. Which pharmacy/location (including street and city if local pharmacy) is medication to be sent to?  [facility] [place]   3. Do they need a 30 day or 90 day supply? 30- temporary supply while waiting on new rx sent to mail order

## 2021-03-22 ENCOUNTER — Ambulatory Visit: Payer: Medicare Other | Admitting: Cardiovascular Disease

## 2021-03-31 DIAGNOSIS — M1990 Unspecified osteoarthritis, unspecified site: Secondary | ICD-10-CM | POA: Diagnosis not present

## 2021-03-31 DIAGNOSIS — Z6824 Body mass index (BMI) 24.0-24.9, adult: Secondary | ICD-10-CM | POA: Diagnosis not present

## 2021-03-31 DIAGNOSIS — I10 Essential (primary) hypertension: Secondary | ICD-10-CM | POA: Diagnosis not present

## 2021-03-31 DIAGNOSIS — R300 Dysuria: Secondary | ICD-10-CM | POA: Diagnosis not present

## 2021-04-04 DIAGNOSIS — I213 ST elevation (STEMI) myocardial infarction of unspecified site: Secondary | ICD-10-CM | POA: Diagnosis not present

## 2021-04-04 DIAGNOSIS — Z955 Presence of coronary angioplasty implant and graft: Secondary | ICD-10-CM | POA: Diagnosis not present

## 2021-04-06 ENCOUNTER — Ambulatory Visit (INDEPENDENT_AMBULATORY_CARE_PROVIDER_SITE_OTHER): Payer: Medicare Other

## 2021-04-06 ENCOUNTER — Other Ambulatory Visit: Payer: Self-pay | Admitting: Cardiovascular Disease

## 2021-04-06 ENCOUNTER — Other Ambulatory Visit: Payer: Self-pay

## 2021-04-06 DIAGNOSIS — Z95828 Presence of other vascular implants and grafts: Secondary | ICD-10-CM

## 2021-04-06 DIAGNOSIS — I739 Peripheral vascular disease, unspecified: Secondary | ICD-10-CM

## 2021-04-06 DIAGNOSIS — I6523 Occlusion and stenosis of bilateral carotid arteries: Secondary | ICD-10-CM

## 2021-04-06 DIAGNOSIS — I779 Disorder of arteries and arterioles, unspecified: Secondary | ICD-10-CM

## 2021-04-08 ENCOUNTER — Telehealth: Payer: Self-pay

## 2021-04-08 DIAGNOSIS — I739 Peripheral vascular disease, unspecified: Secondary | ICD-10-CM

## 2021-04-08 NOTE — Telephone Encounter (Signed)
DPR on file. Patients wife made aware of test results with verbalized understanding. Orders placed for 1 yr repeat testing.

## 2021-04-08 NOTE — Telephone Encounter (Signed)
-----   Message from Wellington Hampshire, MD sent at 04/08/2021  1:10 PM EST ----- stable ABI and patent left iliac stent.  Repeat studies in 1 year.

## 2021-04-11 ENCOUNTER — Encounter: Payer: Self-pay | Admitting: Physician Assistant

## 2021-04-11 ENCOUNTER — Ambulatory Visit
Admission: RE | Admit: 2021-04-11 | Discharge: 2021-04-11 | Disposition: A | Discharge status: Home / Self Care | Payer: Medicare Other | Source: Ambulatory Visit | Attending: Physician Assistant | Admitting: Physician Assistant

## 2021-04-11 ENCOUNTER — Other Ambulatory Visit: Payer: Self-pay

## 2021-04-11 ENCOUNTER — Ambulatory Visit: Payer: Medicare Other | Admitting: Physician Assistant

## 2021-04-11 VITALS — BP 128/60 | HR 54 | Ht 66.0 in | Wt 156.6 lb

## 2021-04-11 DIAGNOSIS — I25110 Atherosclerotic heart disease of native coronary artery with unstable angina pectoris: Secondary | ICD-10-CM

## 2021-04-11 DIAGNOSIS — E1159 Type 2 diabetes mellitus with other circulatory complications: Secondary | ICD-10-CM

## 2021-04-11 DIAGNOSIS — I739 Peripheral vascular disease, unspecified: Secondary | ICD-10-CM

## 2021-04-11 DIAGNOSIS — I255 Ischemic cardiomyopathy: Secondary | ICD-10-CM

## 2021-04-11 DIAGNOSIS — E785 Hyperlipidemia, unspecified: Secondary | ICD-10-CM

## 2021-04-11 DIAGNOSIS — R0602 Shortness of breath: Secondary | ICD-10-CM | POA: Diagnosis not present

## 2021-04-11 NOTE — Patient Instructions (Addendum)
Medication Instructions:  Your physician recommends that you continue on your current medications as directed. Please refer to the Current Medication list given to you today.  *If you need a refill on your cardiac medications before your next appointment, please call your pharmacy*   Lab Work: None ordered  If you have labs (blood work) drawn today and your tests are completely normal, you will receive your results only by: [place] (if you have MyChart) OR A paper copy in the mail If you have any lab test that is abnormal or we need to change your treatment, we will call you to review the results.   Testing/Procedures: A chest x-ray takes a picture of the organs and structures inside the chest, including the heart, lungs, and blood vessels. This test can show several things, including, whether the heart is enlarges; whether fluid is building up in the lungs; and whether pacemaker / defibrillator leads are still in place. GO TO [facility], TODAY, FOR X-RAY [address], [address] [place], [place] [address] [phone]   Your physician has requested that you have an echocardiogram. Echocardiography is a painless test that uses sound waves to create images of your heart. It provides your doctor with information about the size and shape of your heart and how well your hearts chambers and valves are working. This procedure takes approximately one hour. There are no restrictions for this procedure.   Follow-Up: At [facility], you and your health needs are our priority.  As part of our continuing mission to provide you with exceptional heart care, we have created designated Provider Care Teams.  These Care Teams include your primary Cardiologist (physician) and Advanced Practice Providers (APPs -  Physician Assistants and Nurse Practitioners) who all work together to provide you with the care you need, when you need it.  We recommend signing up for the patient portal  called "MyChart".  Sign up information is provided on this After Visit Summary.  MyChart is used to connect with patients for Virtual Visits (Telemedicine).  Patients are able to view lab/test results, encounter notes, upcoming appointments, etc.  Non-urgent messages can be sent to your provider as well.   To learn more about what you can do with MyChart, go to [email or web address].    Your next appointment:   2 month(s)  The format for your next appointment:   In Person  Provider:   Jenkins Rouge, MD     Other Instructions

## 2021-04-11 NOTE — Progress Notes (Signed)
Cardiology Office Note    Date:  04/11/2021   ID:  Ricardo, Le Dec 26, 1940, MRN [id number]   PCP:  Angelina Sheriff, MD   [place]  Cardiologist:  Jenkins Rouge, MD   Advanced Practice Provider:  No care team member to display Electrophysiologist:  None   [phone]   Chief Complaint  Patient presents with   Follow-up    History of Present Illness:  Ricardo Le is a 81 y.o. male  with CAD s/p CABG X 4  in 2009, GERD, PAD left iliac stenting, known bilat SFA occlusion, HLD, T2DM.  Patient had STEMI 10/2020 PCI/DES with aspiration thrombectomy to SVG-OM1/OM2, ICM echo EF 35-40% Grade 2 DD. Saw Dr. [name] for worsening claudication 03/04/21 but ABI's stable and patent left iliac stent.  Patient comes in for f/u. Doesn't feel well. Does shallow breathing but O2 sats are ok. Seems to be hyperventilating. Upset he can't do what he used to do. Forgot his hearing aides.  Occasional twinges of chest pain but nothing like when he had his MI.    Past Medical History:  Diagnosis Date   CAD (coronary artery disease)    a. 04/2007 s/p CABG x 4  - LIMA->LAD, VG->OM1->Om2, VG->PDA;  b. 12/2008 & 06/2010 Caths - Native multivessel dzs with 4/4 patent grafts. c. cath 03/09/2015 patent grafts, unchanged from prior cath.   Cancer [facility])    pancreatic  March 2011   Carotid bruit    a. 07/2010 U/S- 0-39% bilat ICA stenosis   Chest pain    Noncardiac probably related to reflux   DDD (degenerative disc disease)    several surgeries   Fracture acetabulum-closed ([place]) 07/08/2018   GERD (gastroesophageal reflux disease)    HNP (herniated nucleus pulposus), lumbar    a. L2-3, s/p laminotomy, microdiskectomy 02/2009   HOH (hard of hearing)    uses amplifiers    Nephrolithiasis    PAD (peripheral artery disease) ([place]) 04/2014   s/p L Common Iliac Stent   Palpitations    Pancreatic mass    a. 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    Pneumonia    hx of  pna   Pure hypercholesterolemia    statin intolerance   Type II or unspecified type diabetes mellitus without mention of complication, not stated as uncontrolled    insulin dependent   Unspecified essential hypertension     Past Surgical History:  Procedure Laterality Date   ABDOMINAL AORTAGRAM N/A 05/13/2014   Procedure: ABDOMINAL [name];  Surgeon: Wellington Hampshire, MD;  Location: [place] CATH LAB;  Service: Cardiovascular;  Laterality: N/A;   ANTERIOR CERVICAL DECOMP/DISCECTOMY FUSION N/A 07/28/2014   Procedure: ANTERIOR CERVICAL DECOMPRESSION/DISCECTOMY FUSION CERVICAL FOUR-FIVE CERVICAL FIVE-SIX ;  Surgeon: Earnie Larsson, MD;  Location: [place] NEURO ORS;  Service: Neurosurgery;  Laterality: N/A;   APPENDECTOMY     arthroscopy right knee     BACK SURGERY     CARDIAC CATHETERIZATION N/A 03/09/2015   Procedure: Left Heart Cath and Cors/Grafts Angiography;  Surgeon: [name] Sine, MD;  Location: [place] CV LAB;  Service: Cardiovascular;  Laterality: N/A;   CHOLECYSTECTOMY     CORONARY ARTERY BYPASS GRAFT     X 4 2009 (LIMA to LAD, SVG to first cicumflex marginal branch with sequential SVG to second cicumflex marginal branch  and saphenous vein to posterior descending coronary artery with endoscopic,vein harvest rt. lower exttremity by Dr.Owen on March  31,2009   CORONARY/GRAFT ACUTE MI REVASCULARIZATION N/A 11/06/2020   Procedure: Coronary/Graft Acute MI Revascularization;  Surgeon: Martinique, Peter M, MD;  Location: [place] CV LAB;  Service: Cardiovascular;  Laterality: N/A;   CYST REMOVED     FROM SPINE   CYSTOURETHROSCOPY     right retrograde pyelogram,manipulate stone in the renal pelvis, rt. double-j catheter.   EYE SURGERY     KNEE SURGERY     rt knee   LEFT HEART CATH AND CORONARY ANGIOGRAPHY N/A 11/06/2020   Procedure: LEFT HEART CATH AND CORONARY ANGIOGRAPHY;  Surgeon: Martinique, Peter M, MD;  Location: [place] CV LAB;  Service: Cardiovascular;  Laterality: N/A;    LEFT HEART CATH AND CORS/GRAFTS ANGIOGRAPHY N/A 02/20/2017   Procedure: LEFT HEART CATH AND CORS/GRAFTS ANGIOGRAPHY;  Surgeon: Leonie Man, MD;  Location: [place] CV LAB;  Service: Cardiovascular;  Laterality: N/A;   LEFT HEART CATH AND CORS/GRAFTS ANGIOGRAPHY N/A 05/28/2018   Procedure: LEFT HEART CATH AND CORS/GRAFTS ANGIOGRAPHY;  Surgeon: Burnell Blanks, MD;  Location: [place] CV LAB;  Service: Cardiovascular;  Laterality: N/A;   NEPHROLITHOTOMY     PERCUTANEOUS   PARS PLANA VITRECTOMY     lt. eye,retinal photocoagulation lt. eye, membrane peel lt. eye.   PERCUTANEOUS STENT INTERVENTION Left 05/13/2014   Procedure: PERCUTANEOUS STENT INTERVENTION;  Surgeon: Wellington Hampshire, MD;  Location: [place] CATH LAB;  Service: Cardiovascular;  Laterality: Left;  COMMON ILLIAC   re-exploratin of laminectomy  05/2008   (RT L2-3) WITH REDO MICRODISKECTOMY    Current Medications: Current Meds  Medication Sig   acetaminophen (TYLENOL) 325 MG tablet Take 650 mg by mouth every 6 (six) hours as needed (for pain).    amLODipine (NORVASC) 10 MG tablet Take 1 tablet (10 mg total) by mouth daily.   aspirin EC 81 MG tablet Take 81 mg by mouth daily.   atorvastatin (LIPITOR) 40 MG tablet Take 1 tablet (40 mg total) by mouth daily.   BD PEN NEEDLE NANO 2ND GEN 32G X 4 MM MISC 1 each by Other route daily.   bisacodyl (DULCOLAX) 5 MG EC tablet Take 5 mg by mouth every 3 (three) days.    Blood Glucose Monitoring Suppl ([place]) w/Device KIT 1 each by Other route as directed.   clopidogrel (PLAVIX) 75 MG tablet Take 1 tablet (75 mg total) by mouth daily.   glimepiride (AMARYL) 2 MG tablet Take 4 mg by mouth daily.   Insulin Glargine (LANTUS) 100 UNIT/ML Solostar Pen Inject 18 Units into the skin at bedtime.   lisinopril-hydrochlorothiazide (ZESTORETIC) 20-12.5 MG tablet Take 1 tablet by mouth daily.   Melatonin 3 MG TABS Take 1 tablet (3 mg total) by mouth at bedtime.    metoprolol succinate (TOPROL-XL) 25 MG 24 hr tablet Take 1 tablet (25 mg total) by mouth daily.   Multiple Vitamin (MULTIVITAMIN WITH MINERALS) TABS tablet Take 1 tablet by mouth daily.   nitroGLYCERIN (NITROSTAT) 0.4 MG SL tablet PLACE 1 TABLET UNDER THE TONGUE EVERY 5 MINUTES AS NEEDED FOR CHEST PAIN   ONETOUCH VERIO test strip 1 each by Other route 2 (two) times daily.   pantoprazole (PROTONIX) 40 MG tablet Take 40 mg by mouth 2 (two) times daily.   spironolactone (ALDACTONE) 25 MG tablet Take 1/2 tablet (12.5 mg total) by mouth daily.   tamsulosin (FLOMAX) 0.4 MG CAPS capsule Take 1 capsule (0.4 mg total) by mouth daily after supper.     Allergies:  Dicyclomine, Iodinated contrast media, Ioversol, Oxycodone hcl, Ranexa [ranolazine], Rosuvastatin calcium, Rosuvastatin calcium, Tramadol, Iohexol, and Statins   Social History   Socioeconomic History   Marital status: Married    Spouse name: Not on file   Number of children: Not on file   Years of education: Not on file   Highest education level: Not on file  Occupational History   Not on file  Tobacco Use   Smoking status: Never   Smokeless tobacco: Never  Vaping Use   Vaping Use: Never used  Substance and Sexual Activity   Alcohol use: No   Drug use: No   Sexual activity: Not Currently  Other Topics Concern   Not on file  Social History Narrative   Lives in [place] with wife.  Retired Company secretary.  Walks 1.78m daily.  Does not work 2/2 back problems.   Social Determinants of Health   Financial Resource Strain: Not on file  Food Insecurity: Not on file  Transportation Needs: Not on file  Physical Activity: Not on file  Stress: Not on file  Social Connections: Not on file     Family History:  The patient's  family history includes Coronary artery disease in his brother and sister; Diabetes in his brother; Heart attack in his father; Other in his mother and another family member.   ROS:   Please see the history of  present illness.    ROS All other systems reviewed and are negative.   PHYSICAL EXAM:   VS:  BP 128/60    Pulse (!) 54    Ht 5' 6" (1.676 m)    Wt 156 lb 9.6 oz (71 kg)    SpO2 96%    BMI 25.28 kg/m   Physical Exam  GEN: Well nourished, well developed, in no acute distress  Neck: no JVD, carotid bruits, or masses Cardiac:RRR; no murmurs, rubs, or gallops  Respiratory:  decreased breath sounds right lung base but otherwise clear to auscultation bilaterally, normal work of breathing GI: soft, nontender, nondistended, + BS Ext: without cyanosis, clubbing, or edema,  Neuro:  Alert and Oriented x 3, Strength and sensation are intact Psych: euthymic mood, full affect  Wt Readings from Last 3 Encounters:  04/11/21 156 lb 9.6 oz (71 kg)  03/04/21 156 lb (70.8 kg)  11/25/20 149 lb 8 oz (67.8 kg)      Studies/Labs Reviewed:   EKG:  EKG is not ordered today.     Recent Labs: 11/06/2020: ALT 22 11/07/2020: Magnesium 1.8 11/09/2020: TSH 2.314 11/10/2020: Hemoglobin 14.9; Platelets 206 11/25/2020: BUN 20; Creatinine, Ser 1.07; Potassium 4.6; Sodium 140   Lipid Panel    Component Value Date/Time   CHOL 164 11/06/2020 1801   CHOL 210 (H) 09/08/2019 1506   TRIG 123 11/06/2020 1801   HDL 50 11/06/2020 1801   HDL 48 09/08/2019 1506   CHOLHDL 3.3 11/06/2020 1801   VLDL 25 11/06/2020 1801   LDLCALC 89 11/06/2020 1801   LDLCALC 145 (H) 09/08/2019 1506    Additional studies/ records that were reviewed today include:    Echo 11/07/21 Sonographer Comments: Suboptimal apical, subcostal, and suprasternal windows. IMPRESSIONS Left ventricular ejection fraction, by estimation, is 35 to 40%. The left ventricle has moderately decreased function. The left ventricle demonstrates regional wall motion abnormalities (see scoring diagram/findings for description). The left ventricular internal cavity size was moderately dilated. Left ventricular diastolic parameters are consistent with Grade II  diastolic dysfunction (pseudonormalization). Elevated left ventricular end-diastolic pressure. 1. Right ventricular  systolic function is normal. The right ventricular size is normal. There is normal pulmonary artery systolic pressure. 2. 3. Left atrial size was moderately dilated. The mitral valve is degenerative. Mild mitral valve regurgitation. No evidence of mitral stenosis. Moderate mitral annular calcification. 4. 5. Tricuspid valve regurgitation is moderate. The aortic valve is tricuspid. Aortic valve regurgitation is not visualized. Moderate sclerosis without stenosis especially non coronary cusp. 6. The inferior vena cava is normal in size with greater than 50% respiratory variability, suggesting right atrial pressure of 3 mmHg. 7. FINDINGS Left Ventricle: Endocarium not well seen septal apical akinesis inferior wall hypokinesis. Left ventricular ejection fraction, by estimation, is 35 to 40%. The left ventricle has moderately decreased function. The left ventricle demonstrates regional wall motion abnormalities. The left ventricular internal cavity size was Final  Cath 11/06/20   Prox LAD to Mid LAD lesion is 85% stenosed.   Prox LAD lesion is 100% stenosed.   Dist Cx lesion is 100% stenosed.   Prox RCA to Dist RCA lesion is 100% stenosed.   Ost 1st Diag lesion is 70% stenosed.   Ost 2nd Diag to 2nd Diag lesion is 70% stenosed.   Ost 3rd Mrg lesion is 100% stenosed.   Mid Graft to Dist Graft lesion before 1st Mrg  is 100% stenosed.   Ost LAD to Prox LAD lesion is 100% stenosed.   Ost Cx to Dist Cx lesion is 100% stenosed.   A drug-eluting stent was successfully placed using a STENT ONYX FRONTIER 3.5X30.   Post intervention, there is a 0% residual stenosis.   SVG and is large.   There is mild left ventricular systolic dysfunction.   LV end diastolic pressure is severely elevated.   The left ventricular ejection fraction is 45-50% by visual estimate.   3 vessel  occlusive CAD. Patient is graft dependent. Patent LIMA to the LAD Patent SVG to the PDA Occluded SVG sequentially to OM1 and OM2 Mild LV dysfunction. EF 45%.  Elevated LVEDP 30 mm Hg Successful PCI of the SVG to OM1 and OM2 with aspiration thrombectomy and DES x 1   Plan: DAPT for one year. Continue pressor support- wean as tolerated. Angiomax discontinued- plan to remove sheath in 2 hours.     Risk Assessment/Calculations:         ASSESSMENT:    1. Coronary artery disease involving native coronary artery of native heart with unstable angina pectoris ([place])   2. Ischemic cardiomyopathy   3. Hyperlipidemia, unspecified hyperlipidemia type   4. PAD (peripheral artery disease) ([place])   5. Type 2 diabetes mellitus with vascular disease ([place])      PLAN:  In order of problems listed above:  Dyspnea-does shallow breathing and almost seems to be hyperventilating. When I asked him to take long deep breaths he calmed down his breathing. Decreased breath sound right lung base. Check CXR and f/u echo. No CHF on exam.  CAD s/p CABG X 4  in 2009, STEMI 10/2020 PCI/DES with aspiration thrombectomy to SVG-OM1/OM2. No angina on plavix and ASA/statin  ICM EF 35-40% grade 2 DD-no heart failure on exam. On metoprolol, aldactone, amlodipine, lisinopril HCTZ. Will update 2Decho. Can consider change in meds pending EF.  HLD LDL 89 on atorvastatin and zetia   PAD left iliac stent and bilateral SFA occlusion, recent ABI's stable and patent stent.  DM2 per PCP      Shared Decision Making/Informed Consent        Medication Adjustments/Labs and Tests Ordered: Current  medicines are reviewed at length with the patient today.  Concerns regarding medicines are outlined above.  Medication changes, Labs and Tests ordered today are listed in the Patient Instructions below. Patient Instructions  Medication Instructions:  Your physician recommends that you continue on your current medications as  directed. Please refer to the Current Medication list given to you today.  *If you need a refill on your cardiac medications before your next appointment, please call your pharmacy*   Lab Work: None ordered  If you have labs (blood work) drawn today and your tests are completely normal, you will receive your results only by: [place] (if you have MyChart) OR A paper copy in the mail If you have any lab test that is abnormal or we need to change your treatment, we will call you to review the results.   Testing/Procedures: A chest x-ray takes a picture of the organs and structures inside the chest, including the heart, lungs, and blood vessels. This test can show several things, including, whether the heart is enlarges; whether fluid is building up in the lungs; and whether pacemaker / defibrillator leads are still in place. GO TO [facility], TODAY, FOR X-RAY [address] [place], [place] [address] [phone]   Your physician has requested that you have an echocardiogram. Echocardiography is a painless test that uses sound waves to create images of your heart. It provides your doctor with information about the size and shape of your heart and how well your hearts chambers and valves are working. This procedure takes approximately one hour. There are no restrictions for this procedure.   Follow-Up: At [facility], you and your health needs are our priority.  As part of our continuing mission to provide you with exceptional heart care, we have created designated Provider Care Teams.  These Care Teams include your primary Cardiologist (physician) and Advanced Practice Providers (APPs -  Physician Assistants and Nurse Practitioners) who all work together to provide you with the care you need, when you need it.  We recommend signing up for the patient portal called "MyChart".  Sign up information is provided on this After Visit Summary.  MyChart is used to connect  with patients for Virtual Visits (Telemedicine).  Patients are able to view lab/test results, encounter notes, upcoming appointments, etc.  Non-urgent messages can be sent to your provider as well.   To learn more about what you can do with MyChart, go to [email or web address].    Your next appointment:   2 month(s)  The format for your next appointment:   In Person  Provider:   Jenkins Rouge, MD     Other Instructions     Signed, Ermalinda Barrios, PA-C  04/11/2021 11:58 AM    [facility] [address], [place], [place]  [address] Phone: [phone]; Fax: [phone]

## 2021-04-13 DIAGNOSIS — Z955 Presence of coronary angioplasty implant and graft: Secondary | ICD-10-CM | POA: Diagnosis not present

## 2021-04-13 DIAGNOSIS — I213 ST elevation (STEMI) myocardial infarction of unspecified site: Secondary | ICD-10-CM | POA: Diagnosis not present

## 2021-04-14 ENCOUNTER — Ambulatory Visit: Payer: Medicare Other | Admitting: Cardiovascular Disease

## 2021-04-14 DIAGNOSIS — I213 ST elevation (STEMI) myocardial infarction of unspecified site: Secondary | ICD-10-CM | POA: Diagnosis not present

## 2021-04-14 DIAGNOSIS — Z955 Presence of coronary angioplasty implant and graft: Secondary | ICD-10-CM | POA: Diagnosis not present

## 2021-04-18 DIAGNOSIS — I213 ST elevation (STEMI) myocardial infarction of unspecified site: Secondary | ICD-10-CM | POA: Diagnosis not present

## 2021-04-18 DIAGNOSIS — Z955 Presence of coronary angioplasty implant and graft: Secondary | ICD-10-CM | POA: Diagnosis not present

## 2021-04-21 DIAGNOSIS — I213 ST elevation (STEMI) myocardial infarction of unspecified site: Secondary | ICD-10-CM | POA: Diagnosis not present

## 2021-04-21 DIAGNOSIS — Z955 Presence of coronary angioplasty implant and graft: Secondary | ICD-10-CM | POA: Diagnosis not present

## 2021-04-28 ENCOUNTER — Other Ambulatory Visit: Payer: Self-pay

## 2021-04-28 ENCOUNTER — Ambulatory Visit (HOSPITAL_COMMUNITY): Payer: Medicare Other | Attending: Cardiology

## 2021-04-28 ENCOUNTER — Telehealth: Payer: Self-pay

## 2021-04-28 DIAGNOSIS — E785 Hyperlipidemia, unspecified: Secondary | ICD-10-CM

## 2021-04-28 DIAGNOSIS — E1159 Type 2 diabetes mellitus with other circulatory complications: Secondary | ICD-10-CM

## 2021-04-28 DIAGNOSIS — I739 Peripheral vascular disease, unspecified: Secondary | ICD-10-CM | POA: Diagnosis not present

## 2021-04-28 DIAGNOSIS — I255 Ischemic cardiomyopathy: Secondary | ICD-10-CM | POA: Diagnosis not present

## 2021-04-28 DIAGNOSIS — I25110 Atherosclerotic heart disease of native coronary artery with unstable angina pectoris: Secondary | ICD-10-CM | POA: Diagnosis not present

## 2021-04-28 LAB — ECHOCARDIOGRAM COMPLETE
Area-P 1/2: 3.05 cm2
S' Lateral: 2.5 cm

## 2021-04-28 MED ORDER — PREDNISONE 50 MG PO TABS: ORAL_TABLET | ORAL | 0 refills | Status: DC

## 2021-04-28 MED ORDER — PERFLUTREN LIPID MICROSPHERE
1.0000 mL | INTRAVENOUS | Status: AC | PRN
  Administered 2021-04-28: 2 mL via INTRAVENOUS

## 2021-04-28 MED ORDER — DIPHENHYDRAMINE HCL 50 MG PO CAPS: ORAL_CAPSULE | ORAL | 0 refills | Status: DC

## 2021-04-28 NOTE — Telephone Encounter (Signed)
-----   Message from Nuala Alpha, LPN sent at 6/58/0063  1:31 PM EDT ----- ?This is the pt of Dr. Kyla Balzarine  ? ?----- Message ----- ?From: Freada Bergeron, MD ?Sent: 04/28/2021   1:30 PM EDT ?To: Josue Hector, MD, Nuala Alpha, LPN, # ? ?Got called about his echo as DOD. He has a pulmonic valve mass. Does not look like vegetation or thrombus but will rule out PE to make sure it is not causing SOB. Spoke to the patient about the results as well. ? ?Just a heads up. ? ?-Heather ? ? ?

## 2021-04-28 NOTE — Telephone Encounter (Signed)
Called patient informed him that CT will be done sometime tomorrow. Will have him take prednisone tonight and in the morning to prep for CT by 11:00 am tomorrow. Will call patient in the morning. ?

## 2021-04-28 NOTE — Telephone Encounter (Signed)
Patient had an echo done today and echo tech had Dr. Johney Frame, DOD, read it.   Dr. Johney Frame called Dr. Johnsie Cancel and Dr. Johnsie Cancel wants this pt to have a CT Angio Chest for PE for sob.  Tried to call patient, left message for patient to call back. ?

## 2021-04-29 ENCOUNTER — Other Ambulatory Visit: Payer: Self-pay

## 2021-04-29 ENCOUNTER — Ambulatory Visit (HOSPITAL_COMMUNITY)
Admission: RE | Admit: 2021-04-29 | Discharge: 2021-04-29 | Disposition: A | Discharge status: Home / Self Care | Payer: Medicare Other | Source: Ambulatory Visit | Attending: Cardiology | Admitting: Cardiology

## 2021-04-29 ENCOUNTER — Other Ambulatory Visit: Payer: Medicare Other | Admitting: *Deleted

## 2021-04-29 DIAGNOSIS — R0602 Shortness of breath: Secondary | ICD-10-CM

## 2021-04-29 DIAGNOSIS — R079 Chest pain, unspecified: Secondary | ICD-10-CM | POA: Diagnosis not present

## 2021-04-29 LAB — POCT I-STAT CREATININE: Creatinine, Ser: 1.1 mg/dL (ref 0.61–1.24)

## 2021-04-29 MED ORDER — IOHEXOL 350 MG/ML SOLN
100.0000 mL | Freq: Once | INTRAVENOUS | Status: AC | PRN
  Administered 2021-04-29: 100 mL via INTRAVENOUS

## 2021-04-29 NOTE — Progress Notes (Signed)
Patient needs BNP per Dr. Johnsie Cancel and Dr. Johney Frame. ?

## 2021-04-30 LAB — PRO B NATRIURETIC PEPTIDE: NT-Pro BNP: 394 pg/mL (ref 0–486)

## 2021-05-02 ENCOUNTER — Telehealth: Payer: Self-pay | Admitting: Cardiovascular Disease

## 2021-05-02 NOTE — Telephone Encounter (Signed)
?  Wife is calling for CT results ?

## 2021-05-02 NOTE — Telephone Encounter (Signed)
Called patient's wife back with results of CT and BNP. She asked about echo results. Will get back to her once results have been read. She verbalized understanding. ?

## 2021-05-04 ENCOUNTER — Other Ambulatory Visit: Payer: Self-pay

## 2021-05-04 DIAGNOSIS — I5189 Other ill-defined heart diseases: Secondary | ICD-10-CM

## 2021-05-04 MED ORDER — PREDNISONE 50 MG PO TABS: ORAL_TABLET | ORAL | 0 refills | Status: DC

## 2021-05-04 MED ORDER — DIPHENHYDRAMINE HCL 50 MG PO CAPS: ORAL_CAPSULE | ORAL | 0 refills | Status: DC

## 2021-05-05 DIAGNOSIS — R3912 Poor urinary stream: Secondary | ICD-10-CM | POA: Diagnosis not present

## 2021-05-05 DIAGNOSIS — N401 Enlarged prostate with lower urinary tract symptoms: Secondary | ICD-10-CM | POA: Diagnosis not present

## 2021-05-05 DIAGNOSIS — R8279 Other abnormal findings on microbiological examination of urine: Secondary | ICD-10-CM | POA: Diagnosis not present

## 2021-05-09 ENCOUNTER — Encounter: Payer: Self-pay | Admitting: Cardiovascular Disease

## 2021-05-09 ENCOUNTER — Telehealth (HOSPITAL_COMMUNITY): Payer: Self-pay | Admitting: Licensed Clinical Social Worker

## 2021-05-09 NOTE — Telephone Encounter (Signed)
error 

## 2021-05-09 NOTE — Telephone Encounter (Signed)
CSW received request to contact patient to assist with financial assistance related to medical bills. Patient's wife states they "live on a budget" and struggle to make the payments for medical bills. CSW provided support and encourage wife to contact [facility] [employer] [facility] to inquire about possible options related to unpaid medical bills. Wife verbalizes understanding and states she will follow up. CSW available as needed. Raquel Sarna, [place], [place] ? ?

## 2021-05-16 DIAGNOSIS — K219 Gastro-esophageal reflux disease without esophagitis: Secondary | ICD-10-CM | POA: Diagnosis not present

## 2021-05-16 DIAGNOSIS — R634 Abnormal weight loss: Secondary | ICD-10-CM | POA: Diagnosis not present

## 2021-05-16 DIAGNOSIS — K5900 Constipation, unspecified: Secondary | ICD-10-CM | POA: Diagnosis not present

## 2021-05-30 ENCOUNTER — Telehealth: Payer: Self-pay

## 2021-05-30 ENCOUNTER — Other Ambulatory Visit: Payer: Self-pay

## 2021-05-30 DIAGNOSIS — I5189 Other ill-defined heart diseases: Secondary | ICD-10-CM

## 2021-05-30 DIAGNOSIS — I255 Ischemic cardiomyopathy: Secondary | ICD-10-CM

## 2021-05-30 DIAGNOSIS — R0602 Shortness of breath: Secondary | ICD-10-CM

## 2021-05-30 NOTE — Telephone Encounter (Signed)
Left message for patient to call back and schedule lab work prior to MRI>  ?

## 2021-06-01 ENCOUNTER — Other Ambulatory Visit: Payer: Medicare Other

## 2021-06-01 ENCOUNTER — Telehealth (HOSPITAL_COMMUNITY): Payer: Self-pay | Admitting: *Deleted

## 2021-06-01 DIAGNOSIS — I5189 Other ill-defined heart diseases: Secondary | ICD-10-CM | POA: Diagnosis not present

## 2021-06-01 DIAGNOSIS — R0602 Shortness of breath: Secondary | ICD-10-CM

## 2021-06-01 DIAGNOSIS — I255 Ischemic cardiomyopathy: Secondary | ICD-10-CM | POA: Diagnosis not present

## 2021-06-01 LAB — CBC
Hematocrit: 37.4 % — ABNORMAL LOW (ref 37.5–51.0)
Hemoglobin: 12.6 g/dL — ABNORMAL LOW (ref 13.0–17.7)
MCH: 31.9 pg (ref 26.6–33.0)
MCHC: 33.7 g/dL (ref 31.5–35.7)
MCV: 95 fL (ref 79–97)
Platelets: 264 10*3/uL (ref 150–450)
RBC: 3.95 x10E6/uL — ABNORMAL LOW (ref 4.14–5.80)
RDW: 13.2 % (ref 11.6–15.4)
WBC: 11.8 10*3/uL — ABNORMAL HIGH (ref 3.4–10.8)

## 2021-06-01 NOTE — Telephone Encounter (Signed)
Reaching out to patient to offer assistance regarding upcoming cardiac imaging study; pt's wife answered and verbalizes understanding of appt date/time, parking situation and where to check in, pre-test NPO status and verified current allergies; name and call back number provided for further questions should they arise ? ?Gordy Clement RN Navigator Cardiac Imaging ?[facility] ?[phone] office ?[phone] cell ? ?Denies claustrophobia or metal but does report a difficult IV. ?

## 2021-06-02 ENCOUNTER — Ambulatory Visit (HOSPITAL_COMMUNITY)
Admission: RE | Admit: 2021-06-02 | Discharge: 2021-06-02 | Disposition: A | Discharge status: Home / Self Care | Payer: Medicare Other | Source: Ambulatory Visit | Attending: Physician Assistant | Admitting: Physician Assistant

## 2021-06-02 DIAGNOSIS — I5189 Other ill-defined heart diseases: Secondary | ICD-10-CM | POA: Diagnosis not present

## 2021-06-02 MED ORDER — GADOBUTROL 1 MMOL/ML IV SOLN
10.0000 mL | Freq: Once | INTRAVENOUS | Status: AC | PRN
  Administered 2021-06-02: 10 mL via INTRAVENOUS

## 2021-06-03 DIAGNOSIS — N411 Chronic prostatitis: Secondary | ICD-10-CM | POA: Diagnosis not present

## 2021-06-06 ENCOUNTER — Telehealth: Payer: Self-pay | Admitting: Cardiovascular Disease

## 2021-06-06 NOTE — Telephone Encounter (Signed)
Patient's wife is calling requesting patient's MRI results. Please advise.  ?

## 2021-06-07 NOTE — Telephone Encounter (Signed)
I called pt's Wife and let her know that we are waiting for Dr Kyla Balzarine input and will call her as soon as we know something.  ?

## 2021-06-16 NOTE — Telephone Encounter (Signed)
Patient and his wife are aware of Dr. Kyla Balzarine result note reported to them on 06/07/21. Per Dr. 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 He had negative CTA for PE. This is not likely clinically significant and he is already on ASA/Plavix. ?

## 2021-06-16 NOTE — Progress Notes (Signed)
Cardiology Office Note    Date:  06/30/2021   ID:  Ricardo Le, DOB 05-05-1940, MRN [id number]   PCP:  Angelina Sheriff, MD   [place]  Cardiologist:  Jenkins Rouge, MD   Advanced Practice Provider:  No care team member to display Electrophysiologist:  None   [phone]   No chief complaint on file.   History of Present Illness:  Ricardo Le is a 81 y.o. male  with CAD s/p CABG X 4  in 2009, GERD, PAD left iliac stenting, known bilat SFA occlusion, HLD, T2DM.  Patient had STEMI 10/2020 PCI/DES with aspiration thrombectomy to SVG-OM1/OM2, ICM echo EF 35-40% Grade 2 DD. Saw Dr. [name] for worsening claudication 03/04/21 but ABI's stable and patent left iliac stent. 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 04/2007 s/p CABG x 4  - LIMA->LAD, VG->OM1->Om2, VG->PDA;  b. 12/2008 & 06/2010 Caths - Native multivessel dzs with 4/4 patent grafts. c. cath 03/09/2015 patent grafts, unchanged from prior cath.   Cancer [facility])    pancreatic  March 2011   Carotid bruit    a. 07/2010 U/S- 0-39% bilat ICA stenosis   Chest pain    Noncardiac probably related to reflux   DDD (degenerative disc disease)    several surgeries   Fracture acetabulum-closed ([place]) 07/08/2018   GERD (gastroesophageal reflux disease)    HNP (herniated nucleus pulposus), lumbar    a. L2-3, s/p laminotomy, microdiskectomy 02/2009   HOH (hard of hearing)    uses amplifiers    Nephrolithiasis     PAD (peripheral artery disease) ([place]) 04/2014   s/p L Common Iliac Stent   Palpitations    Pancreatic mass    a. 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   Pneumonia    hx of  pna   Pure hypercholesterolemia    statin intolerance   Type II or unspecified type diabetes mellitus without mention of complication, not stated as uncontrolled    insulin dependent   Unspecified essential hypertension     Past Surgical History:  Procedure Laterality Date   ABDOMINAL AORTAGRAM N/A 05/13/2014   Procedure: ABDOMINAL [name];  Surgeon: Wellington Hampshire, MD;  Location: [place] CATH LAB;  Service: Cardiovascular;  Laterality: N/A;   ANTERIOR CERVICAL DECOMP/DISCECTOMY FUSION N/A 07/28/2014   Procedure: ANTERIOR CERVICAL DECOMPRESSION/DISCECTOMY FUSION CERVICAL FOUR-FIVE CERVICAL FIVE-SIX ;  Surgeon: Earnie Larsson, MD;  Location: [place] NEURO ORS;  Service: Neurosurgery;  Laterality: N/A;   APPENDECTOMY     arthroscopy right knee     BACK SURGERY     CARDIAC CATHETERIZATION N/A 03/09/2015   Procedure: Left Heart Cath and Cors/Grafts Angiography;  Surgeon: [name] Sine, MD;  Location: [place] CV LAB;  Service: Cardiovascular;  Laterality: N/A;   CHOLECYSTECTOMY     CORONARY ARTERY BYPASS GRAFT     X 4 2009 (LIMA to LAD, SVG to first cicumflex marginal branch with sequential SVG  to second cicumflex marginal branch  and saphenous vein to posterior descending coronary artery with endoscopic,vein harvest rt. lower exttremity by Dr.Owen on March 31,2009   CORONARY/GRAFT ACUTE MI REVASCULARIZATION N/A 11/06/2020   Procedure: Coronary/Graft Acute MI Revascularization;  Surgeon: Martinique, Nakiea Metzner M, MD;  Location: [place] CV LAB;  Service: Cardiovascular;  Laterality: N/A;   CYST REMOVED     FROM SPINE   CYSTOURETHROSCOPY     right retrograde pyelogram,manipulate stone in the renal pelvis, rt. double-j catheter.   EYE SURGERY     KNEE SURGERY     rt knee   LEFT HEART CATH AND CORONARY  ANGIOGRAPHY N/A 11/06/2020   Procedure: LEFT HEART CATH AND CORONARY ANGIOGRAPHY;  Surgeon: Martinique, Elissia Spiewak M, MD;  Location: [place] CV LAB;  Service: Cardiovascular;  Laterality: N/A;   LEFT HEART CATH AND CORS/GRAFTS ANGIOGRAPHY N/A 02/20/2017   Procedure: LEFT HEART CATH AND CORS/GRAFTS ANGIOGRAPHY;  Surgeon: Leonie Man, MD;  Location: [place] CV LAB;  Service: Cardiovascular;  Laterality: N/A;   LEFT HEART CATH AND CORS/GRAFTS ANGIOGRAPHY N/A 05/28/2018   Procedure: LEFT HEART CATH AND CORS/GRAFTS ANGIOGRAPHY;  Surgeon: Burnell Blanks, MD;  Location: [place] CV LAB;  Service: Cardiovascular;  Laterality: N/A;   NEPHROLITHOTOMY     PERCUTANEOUS   PARS PLANA VITRECTOMY     lt. eye,retinal photocoagulation lt. eye, membrane peel lt. eye.   PERCUTANEOUS STENT INTERVENTION Left 05/13/2014   Procedure: PERCUTANEOUS STENT INTERVENTION;  Surgeon: Wellington Hampshire, MD;  Location: [place] CATH LAB;  Service: Cardiovascular;  Laterality: Left;  COMMON ILLIAC   re-exploratin of laminectomy  05/2008   (RT L2-3) WITH REDO MICRODISKECTOMY    Current Medications: Current Meds  Medication Sig   acetaminophen (TYLENOL) 325 MG tablet Take 650 mg by mouth every 6 (six) hours as needed (for pain).    amLODipine (NORVASC) 10 MG tablet Take 1 tablet (10 mg total) by mouth daily.   aspirin EC 81 MG tablet Take 81 mg by mouth daily.   atorvastatin (LIPITOR) 40 MG tablet Take 1 tablet (40 mg total) by mouth daily.   BD PEN NEEDLE NANO 2ND GEN 32G X 4 MM MISC 1 each by Other route daily.   bethanechol (URECHOLINE) 25 MG tablet Take 25 mg by mouth 2 (two) times daily.   bisacodyl (DULCOLAX) 5 MG EC tablet Take 5 mg by mouth every 3 (three) days.    Blood Glucose Monitoring Suppl ([place]) w/Device KIT 1 each by Other route as directed.   clopidogrel (PLAVIX) 75 MG tablet Take 1 tablet (75 mg total) by mouth daily.   diphenhydrAMINE (BENADRYL) 50 MG capsule Take one capsule 1  hour prior to scan.   dutasteride (AVODART) 0.5 MG capsule Take 0.5 mg by mouth daily.   gabapentin (NEURONTIN) 300 MG capsule Take 300 mg by mouth 3 (three) times daily.   glimepiride (AMARYL) 2 MG tablet Take 4 mg by mouth daily.   Insulin Glargine (LANTUS) 100 UNIT/ML Solostar Pen Inject 18 Units into the skin at bedtime.   lisinopril-hydrochlorothiazide (ZESTORETIC) 20-12.5 MG tablet Take 1 tablet by mouth daily.   Melatonin 3 MG TABS Take 1 tablet (3 mg total) by mouth at bedtime.   metoprolol succinate (TOPROL-XL) 25 MG 24 hr tablet Take 1 tablet (25 mg total) by mouth daily.   Multiple Vitamin (MULTIVITAMIN WITH MINERALS) TABS tablet Take 1 tablet by mouth daily.   nitroGLYCERIN (NITROSTAT) 0.4 MG SL tablet PLACE 1 TABLET  UNDER THE TONGUE EVERY 5 MINUTES AS NEEDED FOR CHEST PAIN   ONETOUCH VERIO test strip 1 each by Other route 2 (two) times daily.   pantoprazole (PROTONIX) 40 MG tablet Take 40 mg by mouth 2 (two) times daily.   spironolactone (ALDACTONE) 25 MG tablet Take 1/2 tablet (12.5 mg total) by mouth daily.   tamsulosin (FLOMAX) 0.4 MG CAPS capsule Take 1 capsule (0.4 mg total) by mouth daily after supper.     Allergies:   Dicyclomine, Ioversol, Oxycodone hcl, Ranexa [ranolazine], Rosuvastatin calcium, Rosuvastatin calcium, Tramadol, Iodinated contrast media, Iohexol, and Statins   Social History   Socioeconomic History   Marital status: Married    Spouse name: Not on file   Number of children: Not on file   Years of education: Not on file   Highest education level: Not on file  Occupational History   Not on file  Tobacco Use   Smoking status: Never   Smokeless tobacco: Never  Vaping Use   Vaping Use: Never used  Substance and Sexual Activity   Alcohol use: No   Drug use: No   Sexual activity: Not Currently  Other Topics Concern   Not on file  Social History Narrative   Lives in [place] with wife.  Retired Company secretary.  Walks 1.35mi daily.  Does not work 2/2 back  problems.   Social Determinants of Health   Financial Resource Strain: Not on file  Food Insecurity: Not on file  Transportation Needs: Not on file  Physical Activity: Not on file  Stress: Not on file  Social Connections: Not on file     Family History:  The patient's  family history includes Coronary artery disease in his brother and sister; Diabetes in his brother; Heart attack in his father; Other in his mother and another family member.   ROS:   Please see the history of present illness.    ROS All other systems reviewed and are negative.   PHYSICAL EXAM:   VS:  BP (!) 110/52   Pulse (!) 58   Ht $R'5\' 6"'xL$  (1.676 m)   Wt 147 lb (66.7 kg)   SpO2 95%   BMI 23.73 kg/m   Physical Exam  GEN: Well nourished, well developed, in no acute distress  Neck: no JVD, carotid bruits, or masses Cardiac:RRR; no murmurs, rubs, or gallops  Respiratory:  decreased breath sounds right lung base but otherwise clear to auscultation bilaterally, normal work of breathing GI: soft, nontender, nondistended, + BS Ext: without cyanosis, clubbing, or edema,  Neuro:  Alert and Oriented x 3, Strength and sensation are intact Psych: euthymic mood, full affect  Wt Readings from Last 3 Encounters:  06/30/21 147 lb (66.7 kg)  04/11/21 156 lb 9.6 oz (71 kg)  03/04/21 156 lb (70.8 kg)      Studies/Labs Reviewed:   EKG:  EKG is not ordered today.     Recent Labs: 11/06/2020: ALT 22 11/07/2020: Magnesium 1.8 11/09/2020: TSH 2.314 11/25/2020: BUN 20; Potassium 4.6; Sodium 140 04/29/2021: Creatinine, Ser 1.10; NT-Pro BNP 394 06/01/2021: Hemoglobin 12.6; Platelets 264   Lipid Panel    Component Value Date/Time   CHOL 164 11/06/2020 1801   CHOL 210 (H) 09/08/2019 1506   TRIG 123 11/06/2020 1801   HDL 50 11/06/2020 1801   HDL 48 09/08/2019 1506   CHOLHDL 3.3 11/06/2020 1801   VLDL 25 11/06/2020 1801   LDLCALC 89 11/06/2020 1801   LDLCALC 145 (H) 09/08/2019 1506    Additional studies/  records that  were reviewed today include:   Cardiac MRI:    IMPRESSION: 1. 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 Tissue characterization unable to be definitively performed as the mass is too small to characterize.   2. Normal left ventricular chamber size. Evidence of prior infarct with subendocardial delayed enhancement, first pass perfusion defect, and wall motion abnormality in the LAD distribution. Nontransmural enhancement suggests viable myocardium. LVEF 55%.   3.  Normal right ventricular chamber size and function, RVEF 50%.   4. Mild-moderate mitral valve regurgitation by calculated regurgitant fraction 27%.    Echo 11/07/21   IMPRESSIONS Left ventricular ejection fraction, by estimation, is 35 to 40%. The left ventricle has moderately decreased function. The left ventricle demonstrates regional wall motion abnormalities (see scoring diagram/findings for description). The left ventricular internal cavity size was moderately dilated. Left ventricular diastolic parameters are consistent with Grade II diastolic dysfunction (pseudonormalization). Elevated left ventricular end-diastolic pressure. 1. Right ventricular systolic function is normal. The right ventricular size is normal. There is normal pulmonary artery systolic pressure. 2. 3. Left atrial size was moderately dilated. The mitral valve is degenerative. Mild mitral valve regurgitation. No evidence of mitral stenosis. Moderate mitral annular calcification. 4. 5. Tricuspid valve regurgitation is moderate. The aortic valve is tricuspid. Aortic valve regurgitation is not visualized. Moderate sclerosis without stenosis especially non coronary cusp. 6. The inferior vena cava is normal in size with greater than 50% respiratory variability, suggesting right atrial pressure of 3 mmHg. 7. FINDINGS Left Ventricle: Endocarium not well seen  septal apical akinesis inferior wall hypokinesis. Left ventricular ejection fraction, by estimation, is 35 to 40%. The left ventricle has moderately decreased function. The left ventricle demonstrates regional wall motion abnormalities. The left ventricular internal cavity size was Final  Cath 11/06/20   Prox LAD to Mid LAD lesion is 85% stenosed.   Prox LAD lesion is 100% stenosed.   Dist Cx lesion is 100% stenosed.   Prox RCA to Dist RCA lesion is 100% stenosed.   Ost 1st Diag lesion is 70% stenosed.   Ost 2nd Diag to 2nd Diag lesion is 70% stenosed.   Ost 3rd Mrg lesion is 100% stenosed.   Mid Graft to Dist Graft lesion before 1st Mrg  is 100% stenosed.   Ost LAD to Prox LAD lesion is 100% stenosed.   Ost Cx to Dist Cx lesion is 100% stenosed.   A drug-eluting stent was successfully placed using a STENT ONYX FRONTIER 3.5X30.   Post intervention, there is a 0% residual stenosis.   SVG and is large.   There is mild left ventricular systolic dysfunction.   LV end diastolic pressure is severely elevated.   The left ventricular ejection fraction is 45-50% by visual estimate.   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 Patent LIMA to the LAD Patent SVG to the PDA Occluded SVG sequentially to OM1 and OM2 Mild LV dysfunction. EF 45%.  Elevated LVEDP 30 mm Hg Successful PCI of the SVG to OM1 and OM2 with aspiration thrombectomy and DES x 1   Plan: DAPT for one year. Continue pressor support- wean as tolerated. Angiomax discontinued- plan to remove sheath in 2 hours.     Risk Assessment/Calculations:         ASSESSMENT:    No diagnosis found.    PLAN:  In order of problems listed above:  Dyspnea-EF normal by MRI BNP normal and  CTA no PE on 04/29/21  stable   CAD s/p CABG X 4  in 2009, STEMI 10/2020 PCI/DES with aspiration thrombectomy to SVG-OM1/OM2. No angina on plavix and ASA/statin  ICM EF normal by MRI 06/06/21 55% with minimal LAD subendocardial scar  On  metoprolol, aldactone, amlodipine, lisinopril HCTZ.    HLD LDL 89 on atorvastatin and zetia   PAD left iliac stent and bilateral SFA occlusion, recent ABI's stable and patent stent.F/U ARida   DM2 per PCP    F/U Arida 6 months F/U me in a year    Signed, Jenkins Rouge, MD  06/30/2021 8:51 AM    [facility] [address], [place], [place]  [address] Phone: [phone]; Fax: [phone]

## 2021-06-20 ENCOUNTER — Telehealth: Payer: Self-pay

## 2021-06-20 DIAGNOSIS — N401 Enlarged prostate with lower urinary tract symptoms: Secondary | ICD-10-CM | POA: Diagnosis not present

## 2021-06-20 DIAGNOSIS — N411 Chronic prostatitis: Secondary | ICD-10-CM | POA: Diagnosis not present

## 2021-06-20 NOTE — Telephone Encounter (Signed)
Per Dr. Johnsie Cancel, "EF is normal small scar in heart from old infarct small lesion on PV likely benign fibroelastoma no need for further w/u Not a surgical candidate anyway". Called patient's wife with results. Informed her that I think these have been given to her and her husband, but wanted to make sure they knew the results. Patient's wife stated that sounds about right. While on the phone patient's wife stated that his BP was low at his urologist visit today. Patient's wife stated she already called the PCP and is waiting for a call back. Informed patient's wife if she did not hear back and patient's BP is low in the morning to call our office before she gives him his morning BP medications. Patient's wife verbalized understanding. ?

## 2021-06-30 ENCOUNTER — Ambulatory Visit: Payer: Medicare Other | Admitting: Cardiovascular Disease

## 2021-06-30 ENCOUNTER — Encounter: Payer: Self-pay | Admitting: Cardiovascular Disease

## 2021-06-30 VITALS — BP 110/52 | HR 58 | Ht 66.0 in | Wt 147.0 lb

## 2021-06-30 DIAGNOSIS — Z951 Presence of aortocoronary bypass graft: Secondary | ICD-10-CM

## 2021-06-30 DIAGNOSIS — I6523 Occlusion and stenosis of bilateral carotid arteries: Secondary | ICD-10-CM | POA: Diagnosis not present

## 2021-06-30 DIAGNOSIS — I10 Essential (primary) hypertension: Secondary | ICD-10-CM

## 2021-06-30 DIAGNOSIS — I739 Peripheral vascular disease, unspecified: Secondary | ICD-10-CM | POA: Diagnosis not present

## 2021-06-30 DIAGNOSIS — E785 Hyperlipidemia, unspecified: Secondary | ICD-10-CM

## 2021-06-30 DIAGNOSIS — I25118 Atherosclerotic heart disease of native coronary artery with other forms of angina pectoris: Secondary | ICD-10-CM

## 2021-06-30 DIAGNOSIS — R0602 Shortness of breath: Secondary | ICD-10-CM | POA: Diagnosis not present

## 2021-06-30 NOTE — Patient Instructions (Addendum)
Medication Instructions:  Your physician has recommended you make the following change in your medication:  1-STOP amlodipine (Norvasc)  *If you need a refill on your cardiac medications before your next appointment, please call your pharmacy*  Lab Work: If you have labs (blood work) drawn today and your tests are completely normal, you will receive your results only by: [place] (if you have MyChart) OR A paper copy in the mail If you have any lab test that is abnormal or we need to change your treatment, we will call you to review the results.  Testing/Procedures: None ordered today.  Follow-Up: At [facility], you and your health needs are our priority.  As part of our continuing mission to provide you with exceptional heart care, we have created designated Provider Care Teams.  These Care Teams include your primary Cardiologist (physician) and Advanced Practice Providers (APPs -  Physician Assistants and Nurse Practitioners) who all work together to provide you with the care you need, when you need it.  We recommend signing up for the patient portal called "MyChart".  Sign up information is provided on this After Visit Summary.  MyChart is used to connect with patients for Virtual Visits (Telemedicine).  Patients are able to view lab/test results, encounter notes, upcoming appointments, etc.  Non-urgent messages can be sent to your provider as well.   To learn more about what you can do with MyChart, go to [email or web address].    Your next appointment:   6 months  The format for your next appointment:   In Person  Provider:   Jenkins Rouge, MD {  Other Instructions Your physician recommends that you schedule a follow-up appointment next available with Dr. [name]   Important Information About [place]

## 2021-07-01 DIAGNOSIS — N411 Chronic prostatitis: Secondary | ICD-10-CM | POA: Diagnosis not present

## 2021-07-01 DIAGNOSIS — R3912 Poor urinary stream: Secondary | ICD-10-CM | POA: Diagnosis not present

## 2021-07-01 DIAGNOSIS — R350 Frequency of micturition: Secondary | ICD-10-CM | POA: Diagnosis not present

## 2021-07-01 DIAGNOSIS — N401 Enlarged prostate with lower urinary tract symptoms: Secondary | ICD-10-CM | POA: Diagnosis not present

## 2021-07-06 ENCOUNTER — Telehealth: Payer: Self-pay | Admitting: Cardiovascular Disease

## 2021-07-06 MED ORDER — CLOPIDOGREL BISULFATE 75 MG PO TABS: 75.0000 mg | ORAL_TABLET | Freq: Every day | ORAL | 3 refills | Status: DC

## 2021-07-06 NOTE — Telephone Encounter (Signed)
*  STAT* If patient is at the pharmacy, call can be transferred to refill team.   1. Which medications need to be refilled? (please list name of each medication and dose if known)  clopidogrel (PLAVIX) 75 MG tablet  2. Which pharmacy/location (including street and city if local pharmacy) is medication to be sent to? [id number] - [place], [place] - [address] AT [address]  3. Do they need a 30 day or 90 day supply?  90 day supply

## 2021-07-06 NOTE — Telephone Encounter (Signed)
Pt's medication was sent to pt's pharmacy as requested. Confirmation received.  °

## 2021-07-07 ENCOUNTER — Telehealth: Payer: Self-pay | Admitting: Cardiovascular Disease

## 2021-07-07 NOTE — Telephone Encounter (Signed)
   Primary Cardiologist: Jenkins Rouge, MD  Chart reviewed as part of pre-operative protocol coverage. Given past medical history and time since last visit, based on ACC/AHA guidelines, Ricardo Le would be at acceptable risk for the planned procedure without further cardiovascular testing.   Patient underwent cardiac catheterization with stent placement on 11/06/2020.  He will not be eligible to hold dual antiplatelet therapy until 11/06/2021.  His aspirin and Plavix will need to be continued throughout his procedure.  I will route this recommendation to the requesting party via Epic fax function and remove from pre-op pool.  Please call with questions.  Jossie Ng. Keefe Zawistowski NP-C    07/07/2021, 10:55 AM [place] [address] Office [phone] Fax [phone]

## 2021-07-07 NOTE — Telephone Encounter (Signed)
   Pre-operative Risk Assessment    Patient Name: Ricardo Le  DOB: 1940-03-02 MRN: [id number]      Request for Surgical Clearance    Procedure: Rezum  Date of Surgery:  Clearance 08/22/21                                 Surgeon:  Dr. Gloriann Loan Surgeon's Group or Practice Name:  [facility] Phone number:  [phone] Fax number:  [phone]   Type of Clearance Requested:   - Pharmacy:  Hold Clopidogrel (Plavix) 5 days prior   Type of Anesthesia:  nitrous    Additional requests/questions:    Signed, Selena Zobro   07/07/2021, 9:29 AM

## 2021-07-25 ENCOUNTER — Telehealth: Payer: Self-pay | Admitting: Cardiovascular Disease

## 2021-07-25 NOTE — Telephone Encounter (Signed)
Left message for patient to call back  

## 2021-07-25 NOTE — Telephone Encounter (Signed)
Pt's wife calling In regards to a procedure that pt was suppose to have but was told that Dr. Johnsie Cancel stated that it would not be a good idea, so the procedure was cancelled. Pt's wife would like a call back regarding this matter. Please advise

## 2021-07-25 NOTE — Telephone Encounter (Signed)
[facility] had called patient and told him to hold his plavix for 5 days. Our office had sent a message to [facility] that told them    "Patient underwent cardiac catheterization with stent placement on 11/06/2020.  He will not be eligible to hold dual antiplatelet therapy until 11/06/2021.  His aspirin and Plavix will need to be continued throughout his procedure." Patient was unaware that he could not hold his Plavix and was preparing for surgery that was scheduled today. Patient's wife stated [facility] called patient today to let him know they canceled his surgery. Patient restarted his Plavix today.  Patient was off his plavix for 5 days. Will forward to Dr. Johnsie Cancel so he is aware and for any further recommendations. Informed patient that Dr. Johnsie Cancel advised that he is not eligible to hold his Plavix until 11/06/21 and he should reschedule his procedure to after this date.

## 2021-07-26 NOTE — Telephone Encounter (Signed)
Patient's wife returned call  yesterday. Informed her that patient would need to take extra dose of plavix 300 mg that day, and then take plavix 75 mg by mouth daily. Patient wife verbalized understanding. Patient had already taken one 75 mg tablet and would take an additional 3 tablets.

## 2021-07-27 ENCOUNTER — Telehealth: Payer: Self-pay | Admitting: Cardiovascular Disease

## 2021-07-27 DIAGNOSIS — G4489 Other headache syndrome: Secondary | ICD-10-CM | POA: Diagnosis not present

## 2021-07-27 DIAGNOSIS — N390 Urinary tract infection, site not specified: Secondary | ICD-10-CM | POA: Diagnosis not present

## 2021-07-27 DIAGNOSIS — D72829 Elevated white blood cell count, unspecified: Secondary | ICD-10-CM | POA: Diagnosis not present

## 2021-07-27 DIAGNOSIS — R519 Headache, unspecified: Secondary | ICD-10-CM | POA: Diagnosis not present

## 2021-07-27 DIAGNOSIS — R0602 Shortness of breath: Secondary | ICD-10-CM | POA: Diagnosis not present

## 2021-07-27 DIAGNOSIS — N419 Inflammatory disease of prostate, unspecified: Secondary | ICD-10-CM | POA: Diagnosis not present

## 2021-07-27 DIAGNOSIS — R112 Nausea with vomiting, unspecified: Secondary | ICD-10-CM | POA: Diagnosis not present

## 2021-07-27 DIAGNOSIS — R110 Nausea: Secondary | ICD-10-CM | POA: Diagnosis not present

## 2021-07-27 DIAGNOSIS — J9811 Atelectasis: Secondary | ICD-10-CM | POA: Diagnosis not present

## 2021-07-27 DIAGNOSIS — R531 Weakness: Secondary | ICD-10-CM | POA: Diagnosis not present

## 2021-07-27 DIAGNOSIS — R300 Dysuria: Secondary | ICD-10-CM | POA: Diagnosis not present

## 2021-07-27 DIAGNOSIS — E872 Acidosis, unspecified: Secondary | ICD-10-CM | POA: Diagnosis not present

## 2021-07-27 DIAGNOSIS — R5383 Other fatigue: Secondary | ICD-10-CM | POA: Diagnosis not present

## 2021-07-27 DIAGNOSIS — E1110 Type 2 diabetes mellitus with ketoacidosis without coma: Secondary | ICD-10-CM | POA: Diagnosis not present

## 2021-07-27 DIAGNOSIS — E1165 Type 2 diabetes mellitus with hyperglycemia: Secondary | ICD-10-CM | POA: Diagnosis not present

## 2021-07-27 NOTE — Telephone Encounter (Signed)
Spoke with pt's wife, DPR who reports pt developed a severe headache with nausea last night that was unrelieved with Tramodol and Tylenol.  Pt's wife states she did call EMS who came out and evaluated pt. last night.  She states vital signs and neuro exam was normal so they opted not to go to the ED.  Wife reports pt continues to have headache and nausea that is some better but not completely gone.  No additional new symptoms.  Pt has an appointment this morning at 1130am with his PCP office for further evaluation.  Pt' wife is concerned this may be related to pt's Plavix being held for a previous urology procedure.  Pt has been taking medications as prescribed since last seeing Dr Johnsie Cancel in the office.   Recommended pt plan to keep PCP appointment this morning as scheduled.  If symptoms worsen between now and the appointment time to call EMS.  Pt's wife verbalizes understanding and agrees with current plan.

## 2021-07-27 NOTE — Telephone Encounter (Signed)
Pt would like for nurse Jeannene Patella) to give her a call back regarding the conversation that they had the other day about pt's blood thinner. Please advise

## 2021-07-28 DIAGNOSIS — E1110 Type 2 diabetes mellitus with ketoacidosis without coma: Secondary | ICD-10-CM | POA: Diagnosis not present

## 2021-07-28 DIAGNOSIS — N390 Urinary tract infection, site not specified: Secondary | ICD-10-CM | POA: Diagnosis not present

## 2021-07-28 DIAGNOSIS — I4510 Unspecified right bundle-branch block: Secondary | ICD-10-CM | POA: Diagnosis not present

## 2021-07-28 DIAGNOSIS — N419 Inflammatory disease of prostate, unspecified: Secondary | ICD-10-CM | POA: Diagnosis not present

## 2021-07-29 DIAGNOSIS — E1110 Type 2 diabetes mellitus with ketoacidosis without coma: Secondary | ICD-10-CM | POA: Diagnosis not present

## 2021-07-29 DIAGNOSIS — N390 Urinary tract infection, site not specified: Secondary | ICD-10-CM | POA: Diagnosis not present

## 2021-07-29 DIAGNOSIS — N419 Inflammatory disease of prostate, unspecified: Secondary | ICD-10-CM | POA: Diagnosis not present

## 2021-08-08 DIAGNOSIS — R3915 Urgency of urination: Secondary | ICD-10-CM | POA: Diagnosis not present

## 2021-08-08 DIAGNOSIS — N401 Enlarged prostate with lower urinary tract symptoms: Secondary | ICD-10-CM | POA: Diagnosis not present

## 2021-08-26 ENCOUNTER — Ambulatory Visit (INDEPENDENT_AMBULATORY_CARE_PROVIDER_SITE_OTHER): Payer: Medicare Other | Admitting: Ophthalmology

## 2021-08-26 DIAGNOSIS — H35032 Hypertensive retinopathy, left eye: Secondary | ICD-10-CM | POA: Diagnosis not present

## 2021-08-26 DIAGNOSIS — E113593 Type 2 diabetes mellitus with proliferative diabetic retinopathy without macular edema, bilateral: Secondary | ICD-10-CM

## 2021-08-26 DIAGNOSIS — H43811 Vitreous degeneration, right eye: Secondary | ICD-10-CM

## 2021-08-26 DIAGNOSIS — I10 Essential (primary) hypertension: Secondary | ICD-10-CM | POA: Diagnosis not present

## 2021-08-26 DIAGNOSIS — I739 Peripheral vascular disease, unspecified: Secondary | ICD-10-CM

## 2021-08-30 DIAGNOSIS — D510 Vitamin B12 deficiency anemia due to intrinsic factor deficiency: Secondary | ICD-10-CM | POA: Diagnosis not present

## 2021-08-30 DIAGNOSIS — M1990 Unspecified osteoarthritis, unspecified site: Secondary | ICD-10-CM | POA: Diagnosis not present

## 2021-08-30 DIAGNOSIS — Z6822 Body mass index (BMI) 22.0-22.9, adult: Secondary | ICD-10-CM | POA: Diagnosis not present

## 2021-08-30 DIAGNOSIS — I10 Essential (primary) hypertension: Secondary | ICD-10-CM | POA: Diagnosis not present

## 2021-09-05 ENCOUNTER — Telehealth: Payer: Self-pay | Admitting: Cardiovascular Disease

## 2021-09-05 NOTE — Telephone Encounter (Signed)
  Pt wanted to ask Dr. Johnsie Cancel when the pt need to get his carotid check

## 2021-09-05 NOTE — Telephone Encounter (Signed)
Called patient's wife back and informed her to follow up with Dr. [name], who patient sees for PAD. Informed her that patient's last carotid results stated patient would not need to repeat study.   Wellington Hampshire, MD  04/08/2021  1:11 PM EST     Mild nonobstructive carotid disease.  No need to repeat study.    Patient's wife will get in contact with Dr. Tyrell Antonio office and see what she needs to do next.

## 2021-09-05 NOTE — Telephone Encounter (Signed)
Ricardo Le returned call.

## 2021-09-05 NOTE — Telephone Encounter (Signed)
Left message for patient to call back  

## 2021-09-06 ENCOUNTER — Ambulatory Visit: Payer: Medicare Other | Admitting: Cardiovascular Disease

## 2021-09-22 DIAGNOSIS — M1990 Unspecified osteoarthritis, unspecified site: Secondary | ICD-10-CM | POA: Diagnosis not present

## 2021-09-22 DIAGNOSIS — I10 Essential (primary) hypertension: Secondary | ICD-10-CM | POA: Diagnosis not present

## 2021-09-22 DIAGNOSIS — Z6823 Body mass index (BMI) 23.0-23.9, adult: Secondary | ICD-10-CM | POA: Diagnosis not present

## 2021-09-22 DIAGNOSIS — N63 Unspecified lump in unspecified breast: Secondary | ICD-10-CM | POA: Diagnosis not present

## 2021-10-05 DIAGNOSIS — N62 Hypertrophy of breast: Secondary | ICD-10-CM | POA: Diagnosis not present

## 2021-11-03 ENCOUNTER — Telehealth: Payer: Self-pay | Admitting: Cardiovascular Disease

## 2021-11-03 NOTE — Telephone Encounter (Signed)
Pt c/o medication issue:  1. Name of Medication:   clopidogrel (PLAVIX) 75 MG tablet    2. How are you currently taking this medication (dosage and times per day)? Take 1 tablet (75 mg total) by mouth daily.  3. Are you having a reaction (difficulty breathing--STAT)?   4. What is your medication issue? Pt spouse called about pt coming off medication for prostate procedue. She states the pt and Dr. Johnsie Cancel discussed this a year ago

## 2021-11-03 NOTE — Telephone Encounter (Signed)
See phone note 07/07/21-  Primary Cardiologist: Jenkins Rouge, MD   Chart reviewed as part of pre-operative protocol coverage. Given past medical history and time since last visit, based on ACC/AHA guidelines, Ricardo Le would be at acceptable risk for the planned procedure without further cardiovascular testing.    Patient underwent cardiac catheterization with stent placement on 11/06/2020.  He will not be eligible to hold dual antiplatelet therapy until 11/06/2021.  His aspirin and Plavix will need to be continued throughout his procedure.   I will route this recommendation to the requesting party via Epic fax function and remove from pre-op pool.   Please call with questions.   Jossie Ng. Cle[place] NP-C

## 2021-11-04 NOTE — Telephone Encounter (Signed)
Left message for patient to call back  

## 2021-11-04 NOTE — Telephone Encounter (Signed)
Called patient's wife and informed her of Dr. Johnsie Cancel message. "Ok to hold Plavix for procedure he is at a year post intervention with a failed SVG with stents would prefer DAT indefinitely". Patient verbalized understanding.

## 2021-11-04 NOTE — Telephone Encounter (Signed)
Patient's wife called back. She will contact surgeon to have them send clearance to our office. She would like to know if patient will have to be on Plavix for the rest of his life. Will forward to Dr. Johnsie Cancel for advisement.

## 2021-11-23 DIAGNOSIS — M47816 Spondylosis without myelopathy or radiculopathy, lumbar region: Secondary | ICD-10-CM | POA: Diagnosis not present

## 2021-12-14 DIAGNOSIS — Z7901 Long term (current) use of anticoagulants: Secondary | ICD-10-CM | POA: Diagnosis not present

## 2021-12-14 DIAGNOSIS — M961 Postlaminectomy syndrome, not elsewhere classified: Secondary | ICD-10-CM | POA: Diagnosis not present

## 2021-12-16 DIAGNOSIS — Z1331 Encounter for screening for depression: Secondary | ICD-10-CM | POA: Diagnosis not present

## 2021-12-16 DIAGNOSIS — Z0000 Encounter for general adult medical examination without abnormal findings: Secondary | ICD-10-CM | POA: Diagnosis not present

## 2021-12-16 DIAGNOSIS — Z131 Encounter for screening for diabetes mellitus: Secondary | ICD-10-CM | POA: Diagnosis not present

## 2021-12-16 DIAGNOSIS — Z6824 Body mass index (BMI) 24.0-24.9, adult: Secondary | ICD-10-CM | POA: Diagnosis not present

## 2021-12-26 NOTE — Progress Notes (Signed)
Cardiology Office Note    Date:  01/03/2022   ID:  CID AGENA, DOB 1940/06/11, MRN [id number]  PCP:  Angelina Sheriff, MD   [place]  Cardiologist:  Jenkins Rouge, MD    History of Present Illness:  Ricardo Le is a 81 y.o. male  with CAD s/p CABG X 4  in 2009, GERD, PAD left iliac stenting, known bilat SFA occlusion, HLD, T2DM.  Patient had STEMI 10/2020 PCI/DES with aspiration thrombectomy to SVG-OM1/OM2, ICM echo EF 35-40% Grade 2 DD. Saw Dr. [name] for worsening claudication 03/04/21 but ABI's stable and patent left iliac stent. 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 Given age this is not anything that would need surgical intervention and just DAT which he is on for his CAD  Updated carotids 03/07/21 with nonobstructive disease Despite dyspnea BNP normal 04/29/21 with Hct drop to 37.4 from 44   Having some prostate issues with hypertrophy and UTI"s Seeing Dr Gilford Rile with [facility] On Flomax and Proscar Needs to have in office procedure with Dr Marcene Corning to hold plavix for 5 days and proceed   Wife stopped his aldactone and this is fine as he has no edema and BP fine without it  Past Medical History:  Diagnosis Date   CAD (coronary artery disease)    a. 04/2007 s/p CABG x 4  - LIMA->LAD, VG->OM1->Om2, VG->PDA;  b. 12/2008 & 06/2010 Caths - Native multivessel dzs with 4/4 patent grafts. c. cath 03/09/2015 patent grafts, unchanged from prior cath.   Cancer [facility])    pancreatic  March 2011   Carotid bruit    a. 07/2010 U/S- 0-39% bilat ICA stenosis   Chest pain    Noncardiac probably related to reflux   DDD (degenerative disc disease)    several surgeries   Fracture acetabulum-closed ([place]) 07/08/2018   GERD (gastroesophageal reflux disease)    HNP (herniated nucleus pulposus), lumbar    a. L2-3, s/p laminotomy, microdiskectomy 02/2009   HOH (hard of hearing)    uses  amplifiers    Nephrolithiasis    PAD (peripheral artery disease) ([place]) 04/2014   s/p L Common Iliac Stent   Palpitations    Pancreatic mass    a. 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   Pneumonia    hx of  pna   Pure hypercholesterolemia    statin intolerance   Type II or unspecified type diabetes mellitus without mention of complication, not stated as uncontrolled    insulin dependent   Unspecified essential hypertension     Past Surgical History:  Procedure Laterality Date   ABDOMINAL AORTAGRAM N/A 05/13/2014   Procedure: ABDOMINAL [name];  Surgeon: Wellington Hampshire, MD;  Location: [place] CATH LAB;  Service: Cardiovascular;  Laterality: N/A;   ANTERIOR CERVICAL DECOMP/DISCECTOMY FUSION N/A 07/28/2014   Procedure: ANTERIOR CERVICAL DECOMPRESSION/DISCECTOMY FUSION CERVICAL FOUR-FIVE CERVICAL FIVE-SIX ;  Surgeon: Earnie Larsson, MD;  Location: [place] NEURO ORS;  Service: Neurosurgery;  Laterality: N/A;   APPENDECTOMY     arthroscopy right knee     BACK SURGERY     CARDIAC CATHETERIZATION N/A 03/09/2015   Procedure: Left Heart Cath and Cors/Grafts Angiography;  Surgeon: [name] Sine, MD;  Location: [place] CV LAB;  Service: Cardiovascular;  Laterality: N/A;   CHOLECYSTECTOMY     CORONARY ARTERY BYPASS GRAFT     X 4 2009 (LIMA to  LAD, SVG to first cicumflex marginal branch with sequential SVG to second cicumflex marginal branch  and saphenous vein to posterior descending coronary artery with endoscopic,vein harvest rt. lower exttremity by Dr.Owen on March 31,2009   CORONARY/GRAFT ACUTE MI REVASCULARIZATION N/A 11/06/2020   Procedure: Coronary/Graft Acute MI Revascularization;  Surgeon: Martinique, Alexsis Kathman M, MD;  Location: [place] CV LAB;  Service: Cardiovascular;  Laterality: N/A;   CYST REMOVED     FROM SPINE   CYSTOURETHROSCOPY     right retrograde pyelogram,manipulate stone in the renal pelvis, rt. double-j catheter.   EYE SURGERY     KNEE SURGERY     rt knee   LEFT  HEART CATH AND CORONARY ANGIOGRAPHY N/A 11/06/2020   Procedure: LEFT HEART CATH AND CORONARY ANGIOGRAPHY;  Surgeon: Martinique, Kimerly Rowand M, MD;  Location: [place] CV LAB;  Service: Cardiovascular;  Laterality: N/A;   LEFT HEART CATH AND CORS/GRAFTS ANGIOGRAPHY N/A 02/20/2017   Procedure: LEFT HEART CATH AND CORS/GRAFTS ANGIOGRAPHY;  Surgeon: Leonie Man, MD;  Location: [place] CV LAB;  Service: Cardiovascular;  Laterality: N/A;   LEFT HEART CATH AND CORS/GRAFTS ANGIOGRAPHY N/A 05/28/2018   Procedure: LEFT HEART CATH AND CORS/GRAFTS ANGIOGRAPHY;  Surgeon: Burnell Blanks, MD;  Location: [place] CV LAB;  Service: Cardiovascular;  Laterality: N/A;   NEPHROLITHOTOMY     PERCUTANEOUS   PARS PLANA VITRECTOMY     lt. eye,retinal photocoagulation lt. eye, membrane peel lt. eye.   PERCUTANEOUS STENT INTERVENTION Left 05/13/2014   Procedure: PERCUTANEOUS STENT INTERVENTION;  Surgeon: Wellington Hampshire, MD;  Location: [place] CATH LAB;  Service: Cardiovascular;  Laterality: Left;  COMMON ILLIAC   re-exploratin of laminectomy  05/2008   (RT L2-3) WITH REDO MICRODISKECTOMY    Current Medications: Current Meds  Medication Sig   acetaminophen (TYLENOL) 325 MG tablet Take 650 mg by mouth every 6 (six) hours as needed (for pain).    aspirin EC 81 MG tablet Take 81 mg by mouth daily.   atorvastatin (LIPITOR) 40 MG tablet Take 1 tablet (40 mg total) by mouth daily.   BD PEN NEEDLE NANO 2ND GEN 32G X 4 MM MISC 1 each by Other route daily.   bisacodyl (DULCOLAX) 5 MG EC tablet Take 5 mg by mouth every 3 (three) days.    Blood Glucose Monitoring Suppl ([place]) w/Device KIT 1 each by Other route as directed.   clopidogrel (PLAVIX) 75 MG tablet Take 1 tablet (75 mg total) by mouth daily.   glimepiride (AMARYL) 2 MG tablet Take 4 mg by mouth daily.   Insulin Glargine (LANTUS) 100 UNIT/ML Solostar Pen Inject 18 Units into the skin at bedtime.   lisinopril-hydrochlorothiazide (ZESTORETIC)  20-12.5 MG tablet Take 1 tablet by mouth daily.   Melatonin 3 MG TABS Take 1 tablet (3 mg total) by mouth at bedtime.   metoprolol succinate (TOPROL-XL) 25 MG 24 hr tablet Take 1 tablet (25 mg total) by mouth daily.   Multiple Vitamin (MULTIVITAMIN WITH MINERALS) TABS tablet Take 1 tablet by mouth daily.   nitroGLYCERIN (NITROSTAT) 0.4 MG SL tablet PLACE 1 TABLET UNDER THE TONGUE EVERY 5 MINUTES AS NEEDED FOR CHEST PAIN   ONETOUCH VERIO test strip 1 each by Other route 2 (two) times daily.   pantoprazole (PROTONIX) 40 MG tablet Take 40 mg by mouth 2 (two) times daily.   tamsulosin (FLOMAX) 0.4 MG CAPS capsule Take 1 capsule (0.4 mg total) by mouth daily after supper.     Allergies:  Dicyclomine, Ioversol, Oxycodone hcl, Ranexa [ranolazine], Rosuvastatin calcium, Rosuvastatin calcium, Tramadol, Iodinated contrast media, Iohexol, and Statins   Social History   Socioeconomic History   Marital status: Married    Spouse name: Not on file   Number of children: Not on file   Years of education: Not on file   Highest education level: Not on file  Occupational History   Not on file  Tobacco Use   Smoking status: Never   Smokeless tobacco: Never  Vaping Use   Vaping Use: Never used  Substance and Sexual Activity   Alcohol use: No   Drug use: No   Sexual activity: Not Currently  Other Topics Concern   Not on file  Social History Narrative   Lives in [place] with wife.  Retired Company secretary.  Walks 1.23m daily.  Does not work 2/2 back problems.   Social Determinants of Health   Financial Resource Strain: Not on file  Food Insecurity: Not on file  Transportation Needs: Not on file  Physical Activity: Not on file  Stress: Not on file  Social Connections: Not on file     Family History:  The patient's  family history includes Coronary artery disease in his brother and sister; Diabetes in his brother; Heart attack in his father; Other in his mother and another family member.   ROS:    Please see the history of present illness.    ROS All other systems reviewed and are negative.   PHYSICAL EXAM:   VS:  BP (!) 120/52   Pulse 65   Ht _0  (1.676 m)   Wt 150 lb 12.8 oz (68.4 kg)   SpO2 98%   BMI 24.34 kg/m   Physical Exam  GEN: Well nourished, well developed, in no acute distress  Neck: no JVD, carotid bruits, or masses Cardiac:RRR; no murmurs, rubs, or gallops  Respiratory:  decreased breath sounds right lung base but otherwise clear to auscultation bilaterally, normal work of breathing GI: soft, nontender, nondistended, + BS Ext: without cyanosis, clubbing, or edema,  Neuro:  Alert and Oriented x 3, Strength and sensation are intact Psych: euthymic mood, full affect  Wt Readings from Last 3 Encounters:  01/03/22 150 lb 12.8 oz (68.4 kg)  06/30/21 147 lb (66.7 kg)  04/11/21 156 lb 9.6 oz (71 kg)      Studies/Labs Reviewed:   EKG:  EKG is not ordered today.     Recent Labs: 04/29/2021: Creatinine, Ser 1.10; NT-Pro BNP 394 06/01/2021: Hemoglobin 12.6; Platelets 264   Lipid Panel    Component Value Date/Time   CHOL 164 11/06/2020 1801   CHOL 210 (H) 09/08/2019 1506   TRIG 123 11/06/2020 1801   HDL 50 11/06/2020 1801   HDL 48 09/08/2019 1506   CHOLHDL 3.3 11/06/2020 1801   VLDL 25 11/06/2020 1801   LDLCALC 89 11/06/2020 1801   LDLCALC 145 (H) 09/08/2019 1506    Additional studies/ records that were reviewed today include:   Cardiac MRI:    IMPRESSION: 1. 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 Tissue characterization unable to be definitively performed as the mass is too small to characterize.   2. Normal left ventricular chamber size. Evidence of prior infarct with subendocardial delayed enhancement, first pass perfusion defect, and wall motion abnormality in the LAD distribution. Nontransmural enhancement suggests viable myocardium. LVEF 55%.   3.   Normal right ventricular chamber size and function, RVEF  50%.   4. Mild-moderate mitral valve regurgitation by calculated regurgitant fraction 27%.    Echo 11/07/21   IMPRESSIONS Left ventricular ejection fraction, by estimation, is 35 to 40%. The left ventricle has moderately decreased function. The left ventricle demonstrates regional wall motion abnormalities (see scoring diagram/findings for description). The left ventricular internal cavity size was moderately dilated. Left ventricular diastolic parameters are consistent with Grade II diastolic dysfunction (pseudonormalization). Elevated left ventricular end-diastolic pressure. 1. Right ventricular systolic function is normal. The right ventricular size is normal. There is normal pulmonary artery systolic pressure. 2. 3. Left atrial size was moderately dilated. The mitral valve is degenerative. Mild mitral valve regurgitation. No evidence of mitral stenosis. Moderate mitral annular calcification. 4. 5. Tricuspid valve regurgitation is moderate. The aortic valve is tricuspid. Aortic valve regurgitation is not visualized. Moderate sclerosis without stenosis especially non coronary cusp. 6. The inferior vena cava is normal in size with greater than 50% respiratory variability, suggesting right atrial pressure of 3 mmHg. 7. FINDINGS Left Ventricle: Endocarium not well seen septal apical akinesis inferior wall hypokinesis. Left ventricular ejection fraction, by estimation, is 35 to 40%. The left ventricle has moderately decreased function. The left ventricle demonstrates regional wall motion abnormalities. The left ventricular internal cavity size was Final  Cath 11/06/20   Prox LAD to Mid LAD lesion is 85% stenosed.   Prox LAD lesion is 100% stenosed.   Dist Cx lesion is 100% stenosed.   Prox RCA to Dist RCA lesion is 100% stenosed.   Ost 1st Diag lesion is 70% stenosed.   Ost 2nd Diag to 2nd Diag lesion is 70% stenosed.    Ost 3rd Mrg lesion is 100% stenosed.   Mid Graft to Dist Graft lesion before 1st Mrg  is 100% stenosed.   Ost LAD to Prox LAD lesion is 100% stenosed.   Ost Cx to Dist Cx lesion is 100% stenosed.   A drug-eluting stent was successfully placed using a STENT ONYX FRONTIER 3.5X30.   Post intervention, there is a 0% residual stenosis.   SVG and is large.   There is mild left ventricular systolic dysfunction.   LV end diastolic pressure is severely elevated.   The left ventricular ejection fraction is 45-50% by visual estimate.   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 Patent LIMA to the LAD Patent SVG to the PDA Occluded SVG sequentially to OM1 and OM2 Mild LV dysfunction. EF 45%.  Elevated LVEDP 30 mm Hg Successful PCI of the SVG to OM1 and OM2 with aspiration thrombectomy and DES x 1   Plan: DAPT for one year. Continue pressor support- wean as tolerated. Angiomax discontinued- plan to remove sheath in 2 hours.     Risk Assessment/Calculations:         ASSESSMENT:    1. PVD (peripheral vascular disease) ([place])   2. Coronary artery disease involving native coronary artery of native heart with unstable angina pectoris ([place])   3. Hyperlipidemia, unspecified hyperlipidemia type   4. Bilateral carotid artery stenosis   5. Essential hypertension   6. Preoperative cardiovascular examination       PLAN:  In order of problems listed above:  Dyspnea-EF normal by MRI BNP normal and CTA no PE on 04/29/21  stable   CAD s/p CABG X 4  in 2009, STEMI 10/2020 PCI/DES with aspiration thrombectomy to SVG-OM1/OM2. No angina on plavix and ASA/statin  ICM EF normal by MRI 06/06/21 55% with minimal LAD subendocardial scar  On metoprolol, aldactone,  amlodipine, lisinopril HCTZ.    HLD LDL 89 on atorvastatin and zetia   PAD left iliac stent and bilateral SFA occlusion, 04/07/21 ABI's stable and patent stent.F/U [facility]   DM2 per PCP  Prostate:  BPH on Flomax and Proscar  F/U Dr  Enrique Sack to hold plavix for procedures     F/U [facility] 6 months  F/U me in a year    Signed, Jenkins Rouge, MD  01/03/2022 11:25 AM    [address], [place], [place]  [address] Phone: [phone]; Fax: [phone]

## 2021-12-29 DIAGNOSIS — Z23 Encounter for immunization: Secondary | ICD-10-CM | POA: Diagnosis not present

## 2022-01-03 ENCOUNTER — Encounter: Payer: Self-pay | Admitting: Cardiovascular Disease

## 2022-01-03 ENCOUNTER — Ambulatory Visit: Payer: Medicare Other | Attending: Cardiovascular Disease | Admitting: Cardiovascular Disease

## 2022-01-03 VITALS — BP 120/52 | HR 65 | Ht 66.0 in | Wt 150.8 lb

## 2022-01-03 DIAGNOSIS — I739 Peripheral vascular disease, unspecified: Secondary | ICD-10-CM

## 2022-01-03 DIAGNOSIS — E785 Hyperlipidemia, unspecified: Secondary | ICD-10-CM

## 2022-01-03 DIAGNOSIS — I6523 Occlusion and stenosis of bilateral carotid arteries: Secondary | ICD-10-CM

## 2022-01-03 DIAGNOSIS — I10 Essential (primary) hypertension: Secondary | ICD-10-CM

## 2022-01-03 DIAGNOSIS — I25110 Atherosclerotic heart disease of native coronary artery with unstable angina pectoris: Secondary | ICD-10-CM | POA: Diagnosis not present

## 2022-01-03 DIAGNOSIS — Z01810 Encounter for preprocedural cardiovascular examination: Secondary | ICD-10-CM

## 2022-01-03 NOTE — Patient Instructions (Signed)
Medication Instructions:  No changes. *If you need a refill on your cardiac medications before your next appointment, please call your pharmacy*   Lab Work: None.    Follow-Up: At [facility], you and your health needs are our priority.  As part of our continuing mission to provide you with exceptional heart care, we have created designated Provider Care Teams.  These Care Teams include your primary Cardiologist (physician) and Advanced Practice Providers (APPs -  Physician Assistants and Nurse Practitioners) who all work together to provide you with the care you need, when you need it.  We recommend signing up for the patient portal called "MyChart".  Sign up information is provided on this After Visit Summary.  MyChart is used to connect with patients for Virtual Visits (Telemedicine).  Patients are able to view lab/test results, encounter notes, upcoming appointments, etc.  Non-urgent messages can be sent to your provider as well.   To learn more about what you can do with MyChart, go to [email or web address].    Your next appointment:   1 year(s)  The format for your next appointment:   In Person  Provider:   Jenkins Rouge, MD     Other Instructions Please follow up in six months with Dr. [name] at [facility] office.  Important Information About Sugar

## 2022-01-09 ENCOUNTER — Ambulatory Visit: Payer: Medicare Other | Admitting: Podiatry

## 2022-01-09 DIAGNOSIS — E538 Deficiency of other specified B group vitamins: Secondary | ICD-10-CM | POA: Diagnosis not present

## 2022-01-09 DIAGNOSIS — B351 Tinea unguium: Secondary | ICD-10-CM | POA: Diagnosis not present

## 2022-01-09 DIAGNOSIS — M79676 Pain in unspecified toe(s): Secondary | ICD-10-CM

## 2022-01-09 DIAGNOSIS — E119 Type 2 diabetes mellitus without complications: Secondary | ICD-10-CM

## 2022-01-09 NOTE — Progress Notes (Signed)
  Subjective:  Patient ID: Ricardo Le, male    DOB: 1940-09-12,  MRN: [id number]  Chief Complaint  Patient presents with   Nail Problem    DFC  Possible fungus     81 y.o. male presents with the above complaint. History confirmed with patient. Patient presenting with pain related to dystrophic thickened elongated nails. Patient is unable to trim own nails related to nail dystrophy and/or mobility issues. Patient does have a history of T2DM. Objective:  Physical Exam: warm, good capillary refill nail exam onychomycosis of the toenails, onycholysis, and dystrophic nails DP pulses palpable, PT pulses palpable, and protective sensation absent Left Foot:  Pain with palpation of nails due to elongation and dystrophic growth.  Right Foot: Pain with palpation of nails due to elongation and dystrophic growth.   Assessment:   1. Pain due to onychomycosis of toenail   2. Diabetes mellitus without complication ([place])      Plan:  Patient was evaluated and treated and all questions answered.  #Onychomycosis with pain  -Nails palliatively debrided as below. -Educated on self-care  Procedure: Nail Debridement Rationale: Pain Type of Debridement: manual, sharp debridement. Instrumentation: Nail nipper, rotary burr. Number of Nails: 10  Return in about 3 months (around 04/11/2022) for [facility].         Everitt Amber, DPM [facility]

## 2022-01-19 DIAGNOSIS — R3916 Straining to void: Secondary | ICD-10-CM | POA: Diagnosis not present

## 2022-01-19 DIAGNOSIS — R350 Frequency of micturition: Secondary | ICD-10-CM | POA: Diagnosis not present

## 2022-01-19 DIAGNOSIS — N401 Enlarged prostate with lower urinary tract symptoms: Secondary | ICD-10-CM | POA: Diagnosis not present

## 2022-01-19 DIAGNOSIS — R351 Nocturia: Secondary | ICD-10-CM | POA: Diagnosis not present

## 2022-01-27 ENCOUNTER — Telehealth: Payer: Self-pay | Admitting: Cardiovascular Disease

## 2022-01-27 NOTE — Telephone Encounter (Signed)
   Pre-operative Risk Assessment    Patient Name: Ricardo Le  DOB: August 02, 1940 MRN: [id number]      Request for Surgical Clearance    Procedure:   Rezum for prostate   Date of Surgery:  Clearance 03/06/22                                 Surgeon:  Dr. Gloriann Loan Surgeon's Group or Practice Name:   [facility]   Phone number:  [phone]  Fax number:  [phone]    Type of Clearance Requested:   - Medical  - Pharmacy:  Hold Clopidogrel (Plavix) 5 days prior    Type of Anesthesia:   Nitrous    Additional requests/questions:    Dorthey Sawyer   01/27/2022, 12:28 PM

## 2022-01-27 NOTE — Telephone Encounter (Signed)
   Primary Cardiologist: Jenkins Rouge, MD  Chart reviewed as part of pre-operative protocol coverage. Given past medical history and time since last visit, based on ACC/AHA guidelines, Ricardo Le would be at acceptable risk for the planned procedure without further cardiovascular testing.   Per Dr. Johnsie Cancel, he may hold Plavix for 5 days prior to procedure and should resume as soon as hemodynamically stable.   Patient was advised that if he develops new symptoms prior to surgery to contact our office to arrange a follow-up appointment.    I will route this recommendation to the requesting party via Epic fax function and remove from pre-op pool.  Please call with questions.  Emmaline Life, NP-C  01/27/2022, 1:29 PM [address], [address] Office [phone] Fax [phone]

## 2022-02-09 ENCOUNTER — Other Ambulatory Visit: Payer: Self-pay | Admitting: Cardiovascular Disease

## 2022-02-24 ENCOUNTER — Encounter (INDEPENDENT_AMBULATORY_CARE_PROVIDER_SITE_OTHER): Payer: Medicare HMO | Admitting: Ophthalmology

## 2022-02-26 DIAGNOSIS — I10 Essential (primary) hypertension: Secondary | ICD-10-CM | POA: Diagnosis not present

## 2022-02-26 DIAGNOSIS — I252 Old myocardial infarction: Secondary | ICD-10-CM | POA: Diagnosis not present

## 2022-02-26 DIAGNOSIS — E119 Type 2 diabetes mellitus without complications: Secondary | ICD-10-CM | POA: Diagnosis not present

## 2022-02-26 DIAGNOSIS — Z87891 Personal history of nicotine dependence: Secondary | ICD-10-CM | POA: Diagnosis not present

## 2022-02-26 DIAGNOSIS — R509 Fever, unspecified: Secondary | ICD-10-CM | POA: Diagnosis not present

## 2022-02-26 DIAGNOSIS — U071 COVID-19: Secondary | ICD-10-CM | POA: Diagnosis not present

## 2022-02-26 DIAGNOSIS — Z7982 Long term (current) use of aspirin: Secondary | ICD-10-CM | POA: Diagnosis not present

## 2022-02-26 DIAGNOSIS — E785 Hyperlipidemia, unspecified: Secondary | ICD-10-CM | POA: Diagnosis not present

## 2022-02-26 DIAGNOSIS — R5381 Other malaise: Secondary | ICD-10-CM | POA: Diagnosis not present

## 2022-02-26 DIAGNOSIS — Z951 Presence of aortocoronary bypass graft: Secondary | ICD-10-CM | POA: Diagnosis not present

## 2022-03-01 DIAGNOSIS — J988 Other specified respiratory disorders: Secondary | ICD-10-CM | POA: Diagnosis not present

## 2022-03-01 DIAGNOSIS — R9389 Abnormal findings on diagnostic imaging of other specified body structures: Secondary | ICD-10-CM | POA: Diagnosis not present

## 2022-03-01 DIAGNOSIS — Z981 Arthrodesis status: Secondary | ICD-10-CM | POA: Diagnosis not present

## 2022-03-01 DIAGNOSIS — R053 Chronic cough: Secondary | ICD-10-CM | POA: Diagnosis not present

## 2022-03-06 DIAGNOSIS — R0989 Other specified symptoms and signs involving the circulatory and respiratory systems: Secondary | ICD-10-CM | POA: Diagnosis not present

## 2022-03-13 ENCOUNTER — Encounter (INDEPENDENT_AMBULATORY_CARE_PROVIDER_SITE_OTHER): Payer: Medicare HMO | Admitting: Ophthalmology

## 2022-03-27 DIAGNOSIS — N401 Enlarged prostate with lower urinary tract symptoms: Secondary | ICD-10-CM | POA: Diagnosis not present

## 2022-03-27 DIAGNOSIS — R351 Nocturia: Secondary | ICD-10-CM | POA: Diagnosis not present

## 2022-03-29 ENCOUNTER — Telehealth: Payer: Self-pay

## 2022-03-29 NOTE — Patient Outreach (Signed)
  Care Coordination   03/29/2022 Name: Ricardo Le MRN: [id number] DOB: 1940-06-20   Care Coordination Outreach Attempts:  An unsuccessful telephone outreach was attempted today to offer the patient information about available care coordination services as a benefit of their health plan.  Placed call to patient and a lady answered and reported patient is asleep and just had surgery  Follow Up Plan:  Additional outreach attempts will be made to offer the patient care coordination information and services.   Encounter Outcome:  No Answer   Care Coordination Interventions:  No, not indicated    Tomasa Rand, RN, BSN, [facility] [employer] [phone]

## 2022-04-04 DIAGNOSIS — R339 Retention of urine, unspecified: Secondary | ICD-10-CM | POA: Diagnosis not present

## 2022-04-06 ENCOUNTER — Telehealth: Payer: Self-pay

## 2022-04-06 NOTE — Patient Outreach (Signed)
  Care Coordination   04/06/2022 Name: Ricardo Le MRN: [id number] DOB: July 26, 1940   Care Coordination Outreach Attempts:  A second unsuccessful outreach was attempted today to offer the patient with information about available care coordination services as a benefit of their health plan.     Follow Up Plan:  Additional outreach attempts will be made to offer the patient care coordination information and services.   Encounter Outcome:  No Answer   Care Coordination Interventions:  No, not indicated    Tomasa Rand, RN, BSN, [facility] [employer] [phone]

## 2022-04-10 ENCOUNTER — Telehealth: Payer: Self-pay

## 2022-04-10 NOTE — Patient Outreach (Signed)
  Care Coordination   Initial Visit Note   04/10/2022 Name: ZAM ENT MRN: [id number] DOB: [birth year]  Eathon L Radabaugh is a 82 y.o. year old male who sees [place], Angelique Blonder, MD for primary care. I spoke with  [place] by phone today.  What matters to the patients health and wellness today?  Placed call to patient and a male answered and reports patient is unavailable.  She inquired the reason for my call and I explained to offered additional services for patient and this male refused.     SDOH assessments and interventions completed:  No     Care Coordination Interventions:  No, not indicated   Follow up plan: No further intervention required.   Encounter Outcome:  Pt. Refused   Tomasa Rand, RN, BSN, [facility] [employer] [phone]

## 2022-04-17 DIAGNOSIS — E538 Deficiency of other specified B group vitamins: Secondary | ICD-10-CM | POA: Diagnosis not present

## 2022-05-03 DIAGNOSIS — N401 Enlarged prostate with lower urinary tract symptoms: Secondary | ICD-10-CM | POA: Diagnosis not present

## 2022-05-03 DIAGNOSIS — R350 Frequency of micturition: Secondary | ICD-10-CM | POA: Diagnosis not present

## 2022-05-04 DIAGNOSIS — R69 Illness, unspecified: Secondary | ICD-10-CM | POA: Diagnosis not present

## 2022-05-04 DIAGNOSIS — F4542 Pain disorder with related psychological factors: Secondary | ICD-10-CM | POA: Diagnosis not present

## 2022-05-05 ENCOUNTER — Encounter (INDEPENDENT_AMBULATORY_CARE_PROVIDER_SITE_OTHER): Payer: Medicare HMO | Admitting: Ophthalmology

## 2022-05-05 DIAGNOSIS — H35033 Hypertensive retinopathy, bilateral: Secondary | ICD-10-CM | POA: Diagnosis not present

## 2022-05-05 DIAGNOSIS — H43811 Vitreous degeneration, right eye: Secondary | ICD-10-CM | POA: Diagnosis not present

## 2022-05-05 DIAGNOSIS — I10 Essential (primary) hypertension: Secondary | ICD-10-CM

## 2022-05-05 DIAGNOSIS — E113593 Type 2 diabetes mellitus with proliferative diabetic retinopathy without macular edema, bilateral: Secondary | ICD-10-CM

## 2022-05-08 ENCOUNTER — Telehealth: Payer: Self-pay | Admitting: *Deleted

## 2022-05-08 NOTE — Telephone Encounter (Signed)
Dr. Johnsie Cancel, request received for patient to hold Plavix for 7 days prior to spinal cord stimulator trial and for 5 days after. Due to request for longer than normal hold, it falls outside of our protocols. History of PAD left iliac stenting, known bilat SFA occlusion. S/p STEMI 10/2020 PCI/DES with aspiration thrombectomy to SVG-OM1/OM2, ICM echo EF 35-40% Grade 2 DD.   Please comment on Plavix hold request and send your response to p cv div preop.  Thank you, Sharyn Lull

## 2022-05-08 NOTE — Telephone Encounter (Signed)
   Pre-operative Risk Assessment    Patient Name: Ricardo Le  DOB: 18-Oct-1940 MRN: [id number]      Request for Surgical Clearance    Procedure:   SPINAL CORD STIMULATOR TRIAL  Date of Surgery:  Clearance TBD                                 Surgeon:  DR. DAVE [facility] Surgeon's Group or Practice Name:  [facility] Phone number:  [phone] Fax number:  [phone]   Type of Clearance Requested:   - Pharmacy:  Hold Clopidogrel (Plavix) X'S 7 DAYS PRIOR AND 5 DAYS ONCE LEADS ARE TAKEN OUT   Type of Anesthesia:  Not Indicated   Additional requests/questions:    [name]   05/08/2022, 3:27 PM

## 2022-05-09 NOTE — Telephone Encounter (Signed)
1st attempt to reach pt to schedule tele visit. Lvm  

## 2022-05-09 NOTE — Telephone Encounter (Signed)
Primary Cardiologist:Peter Johnsie Cancel, MD   Preoperative team, please contact this patient and set up a phone call appointment for further preoperative risk assessment. Please obtain consent and complete medication review. Thank you for your help.   Per Dr. Johnsie Cancel, primary cardiologist, patient may hold Plavix for 7 days prior and 5 days post procedure. He should resume Plavix as soon as hemodynamically stable.    Emmaline Life, NP-C  05/09/2022, 5:21 AM [address] Office [phone] Fax [phone]

## 2022-05-10 ENCOUNTER — Telehealth: Payer: Self-pay | Admitting: *Deleted

## 2022-05-10 DIAGNOSIS — J301 Allergic rhinitis due to pollen: Secondary | ICD-10-CM | POA: Diagnosis not present

## 2022-05-10 DIAGNOSIS — J4540 Moderate persistent asthma, uncomplicated: Secondary | ICD-10-CM | POA: Diagnosis not present

## 2022-05-10 DIAGNOSIS — Z8616 Personal history of COVID-19: Secondary | ICD-10-CM | POA: Diagnosis not present

## 2022-05-10 DIAGNOSIS — R0602 Shortness of breath: Secondary | ICD-10-CM | POA: Diagnosis not present

## 2022-05-10 NOTE — Telephone Encounter (Signed)
  Pt is returning call to schedule tele visit 

## 2022-05-10 NOTE — Telephone Encounter (Signed)
Pt has been scheduled for a tele visit, 05/17/22 9:00.  Consent on file / medications reconciled.    Patient Consent for Virtual Visit        Ricardo Le has provided verbal consent on 05/10/2022 for a virtual visit (video or telephone).   CONSENT FOR VIRTUAL VISIT FOR:  Ricardo Le  By participating in this virtual visit I agree to the following:  I hereby voluntarily request, consent and authorize [facility] and its employed or contracted physicians, physician assistants, nurse practitioners or other licensed health care professionals (the Practitioner), to provide me with telemedicine health care services (the "Services") as deemed necessary by the treating Practitioner. I acknowledge and consent to receive the Services by the Practitioner via telemedicine. I understand that the telemedicine visit will involve communicating with the Practitioner through live audiovisual communication technology and the disclosure of certain medical information by electronic transmission. I acknowledge that I have been given the opportunity to request an in-person assessment or other available alternative prior to the telemedicine visit and am voluntarily participating in the telemedicine visit.  I understand that I have the right to withhold or withdraw my consent to the use of telemedicine in the course of my care at any time, without affecting my right to future care or treatment, and that the Practitioner or I may terminate the telemedicine visit at any time. I understand that I have the right to inspect all information obtained and/or recorded in the course of the telemedicine visit and may receive copies of available information for a reasonable fee.  I understand that some of the potential risks of receiving the Services via telemedicine include:  Delay or interruption in medical evaluation due to technological equipment failure or disruption; Information transmitted may not be sufficient  (e.g. poor resolution of images) to allow for appropriate medical decision making by the Practitioner; and/or  In rare instances, security protocols could fail, causing a breach of personal health information.  Furthermore, I acknowledge that it is my responsibility to provide information about my medical history, conditions and care that is complete and accurate to the best of my ability. I acknowledge that Practitioner's advice, recommendations, and/or decision may be based on factors not within their control, such as incomplete or inaccurate data provided by me or distortions of diagnostic images or specimens that may result from electronic transmissions. I understand that the practice of medicine is not an exact science and that Practitioner makes no warranties or guarantees regarding treatment outcomes. I acknowledge that a copy of this consent can be made available to me via my patient portal ([facility]), or I can request a printed copy by calling the office of [facility].    I understand that my insurance will be billed for this visit.   I have read or had this consent read to me. I understand the contents of this consent, which adequately explains the benefits and risks of the Services being provided via telemedicine.  I have been provided ample opportunity to ask questions regarding this consent and the Services and have had my questions answered to my satisfaction. I give my informed consent for the services to be provided through the use of telemedicine in my medical care

## 2022-05-10 NOTE — Telephone Encounter (Signed)
2nd attempt to reach pt, lmptcb 3/27

## 2022-05-10 NOTE — Telephone Encounter (Signed)
Pt has been scheduled for a tele visit, 05/17/22 9:00.  Consent on file / medications reconciled.

## 2022-05-17 NOTE — Progress Notes (Signed)
Virtual Visit via Telephone Note   Because of Ricardo Le's co-morbid illnesses, he is at least at moderate risk for complications without adequate follow up.  This format is felt to be most appropriate for this patient at this time.  The patient did not have access to video technology/had technical difficulties with video requiring transitioning to audio format only (telephone).  All issues noted in this document were discussed and addressed.  No physical exam could be performed with this format.  Please refer to the patient's chart for his consent to telehealth for [facility].  Evaluation Performed:  Preoperative cardiovascular risk assessment _____________   Date:  05/17/2022   Patient ID:  Ricardo Le, DOB 1940/11/12, MRN [id number] Patient Location:  Home Provider location:   Office  Primary Care Provider:  Noni Saupe, MD Primary Cardiologist:  Charlton Haws, MD  Chief Complaint / Patient Profile   82 y.o. y/o male with a h/o PVD, CAD, hyperlipidemia, carotid artery stenosis, and hypertension who is pending spinal cord stimulator trial and presents today for telephonic preoperative cardiovascular risk assessment.  History of Present Illness    Ricardo Le is a 82 y.o. male who presents via Web designer for a telehealth visit today.  Pt was last seen in cardiology clinic on 01/03/2022 by Dr Eden Emms.  At that time Ricardo Le was doing well .  The patient is now pending procedure as outlined above. Since his last visit, he remains stable from a cardiac standpoint.  Today he denies chest pain, shortness of breath, lower extremity edema, fatigue, palpitations, melena, hematuria, hemoptysis, diaphoresis, weakness, presyncope, syncope, orthopnea, and PND.   Past Medical History    Past Medical History:  Diagnosis Date   CAD (coronary artery disease)    a. 04/2007 s/p CABG x 4  - LIMA->LAD, VG->OM1->Om2, VG->PDA;  b. 12/2008 & 06/2010  Caths - Native multivessel dzs with 4/4 patent grafts. c. cath 03/09/2015 patent grafts, unchanged from prior cath.   Cancer [facility])    pancreatic  March 2011   Carotid bruit    a. 07/2010 U/S- 0-39% bilat ICA stenosis   Chest pain    Noncardiac probably related to reflux   DDD (degenerative disc disease)    several surgeries   Fracture acetabulum-closed (HCC) 07/08/2018   GERD (gastroesophageal reflux disease)    HNP (herniated nucleus pulposus), lumbar    a. L2-3, s/p laminotomy, microdiskectomy 02/2009   HOH (hard of hearing)    uses amplifiers    Nephrolithiasis    PAD (peripheral artery disease) (HCC) 04/2014   s/p L Common Iliac Stent   Palpitations    Pancreatic mass    a. 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   Pneumonia    hx of  pna   Pure hypercholesterolemia    statin intolerance   Type II or unspecified type diabetes mellitus without mention of complication, not stated as uncontrolled    insulin dependent   Unspecified essential hypertension    Past Surgical History:  Procedure Laterality Date   ABDOMINAL AORTAGRAM N/A 05/13/2014   Procedure: ABDOMINAL Ronny Flurry;  Surgeon: Iran Ouch, MD;  Location: [facility] CATH LAB;  Service: Cardiovascular;  Laterality: N/A;   ANTERIOR CERVICAL DECOMP/DISCECTOMY FUSION N/A 07/28/2014   Procedure: ANTERIOR CERVICAL DECOMPRESSION/DISCECTOMY FUSION CERVICAL FOUR-FIVE CERVICAL FIVE-SIX ;  Surgeon: Julio Sicks, MD;  Location: [facility] NEURO ORS;  Service: Neurosurgery;  Laterality: N/A;   APPENDECTOMY  arthroscopy right knee     BACK SURGERY     CARDIAC CATHETERIZATION N/A 03/09/2015   Procedure: Left Heart Cath and Cors/Grafts Angiography;  Surgeon: Lennette Bihari, MD;  Location: [facility] INVASIVE CV LAB;  Service: Cardiovascular;  Laterality: N/A;   CHOLECYSTECTOMY     CORONARY ARTERY BYPASS GRAFT     X 4 2009 (LIMA to LAD, SVG to first cicumflex marginal branch with sequential SVG to second cicumflex marginal branch  and  saphenous vein to posterior descending coronary artery with endoscopic,vein harvest rt. lower exttremity by Dr.Owen on March 31,2009   CORONARY/GRAFT ACUTE MI REVASCULARIZATION N/A 11/06/2020   Procedure: Coronary/Graft Acute MI Revascularization;  Surgeon: Swaziland, Peter M, MD;  Location: [facility] INVASIVE CV LAB;  Service: Cardiovascular;  Laterality: N/A;   CYST REMOVED     FROM SPINE   CYSTOURETHROSCOPY     right retrograde pyelogram,manipulate stone in the renal pelvis, rt. double-j catheter.   EYE SURGERY     KNEE SURGERY     rt knee   LEFT HEART CATH AND CORONARY ANGIOGRAPHY N/A 11/06/2020   Procedure: LEFT HEART CATH AND CORONARY ANGIOGRAPHY;  Surgeon: Swaziland, Peter M, MD;  Location: [facility] INVASIVE CV LAB;  Service: Cardiovascular;  Laterality: N/A;   LEFT HEART CATH AND CORS/GRAFTS ANGIOGRAPHY N/A 02/20/2017   Procedure: LEFT HEART CATH AND CORS/GRAFTS ANGIOGRAPHY;  Surgeon: Marykay Lex, MD;  Location: [facility] INVASIVE CV LAB;  Service: Cardiovascular;  Laterality: N/A;   LEFT HEART CATH AND CORS/GRAFTS ANGIOGRAPHY N/A 05/28/2018   Procedure: LEFT HEART CATH AND CORS/GRAFTS ANGIOGRAPHY;  Surgeon: Kathleene Hazel, MD;  Location: [facility] INVASIVE CV LAB;  Service: Cardiovascular;  Laterality: N/A;   NEPHROLITHOTOMY     PERCUTANEOUS   PARS PLANA VITRECTOMY     lt. eye,retinal photocoagulation lt. eye, membrane peel lt. eye.   PERCUTANEOUS STENT INTERVENTION Left 05/13/2014   Procedure: PERCUTANEOUS STENT INTERVENTION;  Surgeon: Iran Ouch, MD;  Location: [facility] CATH LAB;  Service: Cardiovascular;  Laterality: Left;  COMMON ILLIAC   re-exploratin of laminectomy  05/2008   (RT L2-3) WITH REDO MICRODISKECTOMY    Allergies  Allergies  Allergen Reactions   Dicyclomine Anaphylaxis   Ioversol    Oxycodone Hcl Nausea And Vomiting   Ranexa [Ranolazine] Other (See Comments)    Caused weakness (possibly)   Rosuvastatin Calcium Other (See Comments)    Muscle pain   Rosuvastatin Calcium Other (See  Comments)    Myalgias Muscle pain   Tramadol Other (See Comments)    Makes patient "spaced out and dizzy"   Iodinated Contrast Media Other (See Comments) and Rash   Iohexol Hives and Itching    Patient is ok with premedication (Benadryl 50 mg by mouth); Onset Date: 12/03/2007  Patient is ok with premedication (Benadryl 50 mg by mouth); Onset Date: 12/03/2007   Statins Other (See Comments)    Muscle pain, but can tolerate Atorvastatin     Home Medications    Prior to Admission medications   Medication Sig Start Date End Date Taking? Authorizing Provider  acetaminophen (TYLENOL) 325 MG tablet Take 650 mg by mouth every 6 (six) hours as needed (for pain).     [provider]  aspirin EC 81 MG tablet Take 81 mg by mouth daily.    [provider]  atorvastatin (LIPITOR) 40 MG tablet Take 1 tablet (40 mg total) by mouth daily. 11/25/20   Iran Ouch, MD  BD PEN NEEDLE NANO 2ND GEN 32G X 4  MM MISC 1 each by Other route daily. 07/01/19   [provider]  bisacodyl (DULCOLAX) 5 MG EC tablet Take 5 mg by mouth every 3 (three) days.     [provider]  Blood Glucose Monitoring Suppl (ONETOUCH VERIO FLEX SYSTEM) w/Device KIT 1 each by Other route as directed. 01/09/20   [provider]  clopidogrel (PLAVIX) 75 MG tablet Take 1 tablet (75 mg total) by mouth daily. 07/06/21   Wendall Stade, MD  glimepiride (AMARYL) 2 MG tablet Take 4 mg by mouth daily. 06/17/19   [provider]  Insulin Glargine (LANTUS) 100 UNIT/ML Solostar Pen Inject 18 Units into the skin at bedtime. Patient taking differently: Inject 14 Units into the skin at bedtime. 07/30/18   Angiulli, Mcarthur Rossetti, PA-C  lisinopril-hydrochlorothiazide (ZESTORETIC) 20-12.5 MG tablet Take 1 tablet by mouth daily.    [provider]  Melatonin 3 MG TABS Take 1 tablet (3 mg total) by mouth at bedtime. 07/30/18   Angiulli, Mcarthur Rossetti, PA-C  metoprolol succinate (TOPROL-XL) 25 MG 24 hr  tablet Take 1 tablet (25 mg total) by mouth daily. 11/25/20   Iran Ouch, MD  Multiple Vitamin (MULTIVITAMIN WITH MINERALS) TABS tablet Take 1 tablet by mouth daily.    [provider]  nitroGLYCERIN (NITROSTAT) 0.4 MG SL tablet DISSOLVE 1 TABLET UNDER THE TONGUE EVERY 5 MINUTES AS NEEDED FOR CHEST PAIN 02/09/22   Wendall Stade, MD  [facility] VERIO test strip 1 each by Other route 2 (two) times daily. 07/15/19   [provider]  pantoprazole (PROTONIX) 40 MG tablet Take 40 mg by mouth 2 (two) times daily. 02/27/20   [provider]  tamsulosin (FLOMAX) 0.4 MG CAPS capsule Take 1 capsule (0.4 mg total) by mouth daily after supper. 07/30/18   Angiulli, Mcarthur Rossetti, PA-C  traMADol (ULTRAM) 50 MG tablet Take 50 mg by mouth 2 (two) times daily as needed for moderate pain.    [provider]    Physical Exam    Vital Signs:  Ricardo Le does not have vital signs available for review today.  Given telephonic nature of communication, physical exam is limited. AAOx3. NAD. Normal affect.  Speech and respirations are unlabored.  Accessory Clinical Findings    None  Assessment & Plan    1.  Preoperative Cardiovascular Risk Assessment: Spinal cord stimulator trial, [place] [facility] Dr. Aileen Fass      Primary Cardiologist: Charlton Haws, MD  Chart reviewed as part of pre-operative protocol coverage. Given past medical history and time since last visit, based on ACC/AHA guidelines, Ricardo Le would be at acceptable risk for the planned procedure without further cardiovascular testing.   Patient was advised that if he develops new symptoms prior to surgery to contact our office to arrange a follow-up appointment.  He verbalized understanding.  His Plavix may be held for 7 days prior to his procedure and 5 days postprocedure.  Please resume Plavix as soon as hemodynamically stable.  His RCRI is a class IV risk, 11% risk of major  cardiac event.  He is able to complete greater than 4 METS of physical activity.  I will route this recommendation to the requesting party via Epic fax function and remove from pre-op pool.   05/18/2022, 7:20 AM     Time:   Today, I have spent 6 minutes with the patient with telehealth technology discussing medical history, symptoms, and management plan.  Prior to his phone  evaluation I spent greater than 10 minutes reviewing his past medical history and cardiac medications.   Ronney Asters, NP  05/17/2022, 7:33 AM

## 2022-05-18 ENCOUNTER — Ambulatory Visit: Payer: Medicare HMO | Attending: Cardiology

## 2022-05-18 DIAGNOSIS — Z01810 Encounter for preprocedural cardiovascular examination: Secondary | ICD-10-CM

## 2022-05-19 DIAGNOSIS — R0602 Shortness of breath: Secondary | ICD-10-CM | POA: Diagnosis not present

## 2022-05-19 DIAGNOSIS — J479 Bronchiectasis, uncomplicated: Secondary | ICD-10-CM | POA: Diagnosis not present

## 2022-05-26 DIAGNOSIS — Z8616 Personal history of COVID-19: Secondary | ICD-10-CM | POA: Diagnosis not present

## 2022-05-26 DIAGNOSIS — J4540 Moderate persistent asthma, uncomplicated: Secondary | ICD-10-CM | POA: Diagnosis not present

## 2022-05-26 DIAGNOSIS — J301 Allergic rhinitis due to pollen: Secondary | ICD-10-CM | POA: Diagnosis not present

## 2022-05-26 DIAGNOSIS — R0602 Shortness of breath: Secondary | ICD-10-CM | POA: Diagnosis not present

## 2022-06-06 DIAGNOSIS — J986 Disorders of diaphragm: Secondary | ICD-10-CM | POA: Diagnosis not present

## 2022-06-08 DIAGNOSIS — K5909 Other constipation: Secondary | ICD-10-CM | POA: Diagnosis not present

## 2022-06-08 DIAGNOSIS — K219 Gastro-esophageal reflux disease without esophagitis: Secondary | ICD-10-CM | POA: Diagnosis not present

## 2022-06-08 DIAGNOSIS — R634 Abnormal weight loss: Secondary | ICD-10-CM | POA: Diagnosis not present

## 2022-06-11 NOTE — Progress Notes (Signed)
Cardiology Office Note    Date:  06/16/2022   ID:  Ricardo Le, DOB 19-Jul-1940, MRN [id number]  PCP:  Noni Saupe, MD   [facility]  Cardiologist:  Charlton Haws, MD    History of Present Illness:  Ricardo Le is a 82 y.o. male  with CAD s/p CABG X 4  in 2009, GERD, PAD left iliac stenting, known bilat SFA occlusion, HLD, T2DM.  Patient had STEMI 10/2020 PCI/DES with aspiration thrombectomy to SVG-OM1/OM2, ICM echo EF 35-40% Grade 2 DD. Saw Dr. Kirke Corin for worsening claudication 03/04/21 but ABI's stable and patent left iliac stent. 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 04/2007 s/p CABG x 4  - LIMA->LAD, VG->OM1->Om2, VG->PDA;  b. 12/2008 & 06/2010 Caths - Native multivessel dzs with 4/4 patent grafts. c. cath 03/09/2015 patent grafts, unchanged from prior cath.   Cancer [facility])    pancreatic  March 2011   Carotid bruit    a. 07/2010 U/S- 0-39% bilat ICA stenosis   Chest pain    Noncardiac probably related to reflux   DDD (degenerative disc disease)    several surgeries   Fracture acetabulum-closed (HCC) 07/08/2018   GERD (gastroesophageal reflux disease)    HNP (herniated nucleus pulposus), lumbar    a. L2-3, s/p laminotomy, microdiskectomy 02/2009   HOH (hard of hearing)    uses amplifiers    Nephrolithiasis    PAD  (peripheral artery disease) (HCC) 04/2014   s/p L Common Iliac Stent   Palpitations    Pancreatic mass    a. 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   Pneumonia    hx of  pna   Pure hypercholesterolemia    statin intolerance   Type II or unspecified type diabetes mellitus without mention of complication, not stated as uncontrolled    insulin dependent   Unspecified essential hypertension     Past Surgical History:  Procedure Laterality Date   ABDOMINAL AORTAGRAM N/A 05/13/2014   Procedure: ABDOMINAL Ronny Flurry;  Surgeon: Iran Ouch, MD;  Location: [facility] CATH LAB;  Service: Cardiovascular;  Laterality: N/A;   ANTERIOR CERVICAL DECOMP/DISCECTOMY FUSION N/A 07/28/2014   Procedure: ANTERIOR CERVICAL DECOMPRESSION/DISCECTOMY FUSION CERVICAL FOUR-FIVE CERVICAL FIVE-SIX ;  Surgeon: Julio Sicks, MD;  Location: [facility] NEURO ORS;  Service: Neurosurgery;  Laterality: N/A;   APPENDECTOMY     arthroscopy right knee     BACK SURGERY     CARDIAC CATHETERIZATION N/A 03/09/2015   Procedure: Left Heart Cath and Cors/Grafts Angiography;  Surgeon: Lennette Bihari, MD;  Location: [facility] INVASIVE CV LAB;  Service: Cardiovascular;  Laterality: N/A;   CHOLECYSTECTOMY     CORONARY ARTERY BYPASS GRAFT     X 4 2009 (LIMA to LAD, SVG to first cicumflex marginal branch with sequential SVG to second  cicumflex marginal branch  and saphenous vein to posterior descending coronary artery with endoscopic,vein harvest rt. lower exttremity by Dr.Owen on March 31,2009   CORONARY/GRAFT ACUTE MI REVASCULARIZATION N/A 11/06/2020   Procedure: Coronary/Graft Acute MI Revascularization;  Surgeon: Swaziland, Leata Dominy M, MD;  Location: [facility] INVASIVE CV LAB;  Service: Cardiovascular;  Laterality: N/A;   CYST REMOVED     FROM SPINE   CYSTOURETHROSCOPY     right retrograde pyelogram,manipulate stone in the renal pelvis, rt. double-j catheter.   EYE SURGERY     KNEE SURGERY     rt knee   LEFT HEART CATH AND CORONARY ANGIOGRAPHY  N/A 11/06/2020   Procedure: LEFT HEART CATH AND CORONARY ANGIOGRAPHY;  Surgeon: Swaziland, Clay Solum M, MD;  Location: [facility] INVASIVE CV LAB;  Service: Cardiovascular;  Laterality: N/A;   LEFT HEART CATH AND CORS/GRAFTS ANGIOGRAPHY N/A 02/20/2017   Procedure: LEFT HEART CATH AND CORS/GRAFTS ANGIOGRAPHY;  Surgeon: Marykay Lex, MD;  Location: [facility] INVASIVE CV LAB;  Service: Cardiovascular;  Laterality: N/A;   LEFT HEART CATH AND CORS/GRAFTS ANGIOGRAPHY N/A 05/28/2018   Procedure: LEFT HEART CATH AND CORS/GRAFTS ANGIOGRAPHY;  Surgeon: Kathleene Hazel, MD;  Location: [facility] INVASIVE CV LAB;  Service: Cardiovascular;  Laterality: N/A;   NEPHROLITHOTOMY     PERCUTANEOUS   PARS PLANA VITRECTOMY     lt. eye,retinal photocoagulation lt. eye, membrane peel lt. eye.   PERCUTANEOUS STENT INTERVENTION Left 05/13/2014   Procedure: PERCUTANEOUS STENT INTERVENTION;  Surgeon: Iran Ouch, MD;  Location: [facility] CATH LAB;  Service: Cardiovascular;  Laterality: Left;  COMMON ILLIAC   re-exploratin of laminectomy  05/2008   (RT L2-3) WITH REDO MICRODISKECTOMY    Current Medications: Current Meds  Medication Sig   acetaminophen (TYLENOL) 325 MG tablet Take 650 mg by mouth every 6 (six) hours as needed (for pain).    albuterol (VENTOLIN HFA) 108 (90 Base) MCG/ACT inhaler Inhale 2 puffs into the lungs every 6 (six) hours as needed for wheezing or shortness of breath.   aspirin EC 81 MG tablet Take 81 mg by mouth daily.   atorvastatin (LIPITOR) 40 MG tablet Take 1 tablet (40 mg total) by mouth daily.   BD PEN NEEDLE NANO 2ND GEN 32G X 4 MM MISC 1 each by Other route daily.   bisacodyl (DULCOLAX) 5 MG EC tablet Take 5 mg by mouth every 3 (three) days.    Blood Glucose Monitoring Suppl (ONETOUCH VERIO FLEX SYSTEM) w/Device KIT 1 each by Other route as directed.   clopidogrel (PLAVIX) 75 MG tablet Take 1 tablet (75 mg total) by mouth daily.   glimepiride (AMARYL) 2 MG tablet Take 4 mg by mouth daily.   Insulin Glargine  (LANTUS) 100 UNIT/ML Solostar Pen Inject 18 Units into the skin at bedtime. (Patient taking differently: Inject 14 Units into the skin at bedtime.)   lisinopril-hydrochlorothiazide (ZESTORETIC) 20-12.5 MG tablet Take 1 tablet by mouth daily.   Melatonin 3 MG TABS Take 1 tablet (3 mg total) by mouth at bedtime.   metoprolol succinate (TOPROL-XL) 25 MG 24 hr tablet Take 1 tablet (25 mg total) by mouth daily.   Multiple Vitamin (MULTIVITAMIN WITH MINERALS) TABS tablet Take 1 tablet by mouth daily.   nitroGLYCERIN (NITROSTAT) 0.4 MG SL tablet DISSOLVE 1 TABLET UNDER THE TONGUE EVERY 5 MINUTES AS NEEDED FOR CHEST PAIN   ONETOUCH VERIO test strip 1 each by Other route 2 (two) times daily.   pantoprazole (PROTONIX) 40 MG tablet Take 40 mg by mouth 2 (  two) times daily.   tamsulosin (FLOMAX) 0.4 MG CAPS capsule Take 1 capsule (0.4 mg total) by mouth daily after supper.   traMADol (ULTRAM) 50 MG tablet Take 50 mg by mouth 2 (two) times daily as needed for moderate pain.     Allergies:   Dicyclomine, Ioversol, Oxycodone hcl, Ranexa [ranolazine], Rosuvastatin calcium, Rosuvastatin calcium, Tramadol, Iodinated contrast media, Iohexol, and Statins   Social History   Socioeconomic History   Marital status: Married    Spouse name: Not on file   Number of children: Not on file   Years of education: Not on file   Highest education level: Not on file  Occupational History   Not on file  Tobacco Use   Smoking status: Never   Smokeless tobacco: Never  Vaping Use   Vaping Use: Never used  Substance and Sexual Activity   Alcohol use: No   Drug use: No   Sexual activity: Not Currently  Other Topics Concern   Not on file  Social History Narrative   Lives in [place] with wife.  Retired Optician, dispensing.  Walks 1.51mi daily.  Does not work 2/2 back problems.   Social Determinants of Health   Financial Resource Strain: Not on file  Food Insecurity: Not on file  Transportation Needs: Not on file  Physical  Activity: Not on file  Stress: Not on file  Social Connections: Not on file     Family History:  The patient's  family history includes Coronary artery disease in his brother and sister; Diabetes in his brother; Heart attack in his father; Other in his mother and another family member.   ROS:   Please see the history of present illness.    ROS All other systems reviewed and are negative.   PHYSICAL EXAM:   VS:  There were no vitals taken for this visit.  Physical Exam  GEN: Well nourished, well developed, in no acute distress  Neck: no JVD, carotid bruits, or masses Cardiac:RRR; no murmurs, rubs, or gallops  Respiratory:  decreased breath sounds right lung base but otherwise clear to auscultation bilaterally, normal work of breathing GI: soft, nontender, nondistended, + BS Ext: without cyanosis, clubbing, or edema,  Neuro:  Alert and Oriented x 3, Strength and sensation are intact Psych: euthymic mood, full affect  Wt Readings from Last 3 Encounters:  01/03/22 150 lb 12.8 oz (68.4 kg)  06/30/21 147 lb (66.7 kg)  04/11/21 156 lb 9.6 oz (71 kg)      Studies/Labs Reviewed:   EKG:  06/16/2022 SR rate 57 PR 218 msec LAD no acute changes   Recent Labs: No results found for requested labs within last 365 days.   Lipid Panel    Component Value Date/Time   CHOL 164 11/06/2020 1801   CHOL 210 (H) 09/08/2019 1506   TRIG 123 11/06/2020 1801   HDL 50 11/06/2020 1801   HDL 48 09/08/2019 1506   CHOLHDL 3.3 11/06/2020 1801   VLDL 25 11/06/2020 1801   LDLCALC 89 11/06/2020 1801   LDLCALC 145 (H) 09/08/2019 1506    Additional studies/ records that were reviewed today include:   Cardiac MRI:    IMPRESSION: 1. 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 Tissue characterization unable to be definitively performed as the mass is too small to characterize.   2. Normal left ventricular chamber size.  Evidence of prior infarct with subendocardial delayed enhancement, first pass perfusion defect,  and wall motion abnormality in the LAD distribution. Nontransmural enhancement suggests viable myocardium. LVEF 55%.   3.  Normal right ventricular chamber size and function, RVEF 50%.   4. Mild-moderate mitral valve regurgitation by calculated regurgitant fraction 27%.    Echo 11/07/21   IMPRESSIONS Left ventricular ejection fraction, by estimation, is 35 to 40%. The left ventricle has moderately decreased function. The left ventricle demonstrates regional wall motion abnormalities (see scoring diagram/findings for description). The left ventricular internal cavity size was moderately dilated. Left ventricular diastolic parameters are consistent with Grade II diastolic dysfunction (pseudonormalization). Elevated left ventricular end-diastolic pressure. 1. Right ventricular systolic function is normal. The right ventricular size is normal. There is normal pulmonary artery systolic pressure. 2. 3. Left atrial size was moderately dilated. The mitral valve is degenerative. Mild mitral valve regurgitation. No evidence of mitral stenosis. Moderate mitral annular calcification. 4. 5. Tricuspid valve regurgitation is moderate. The aortic valve is tricuspid. Aortic valve regurgitation is not visualized. Moderate sclerosis without stenosis especially non coronary cusp. 6. The inferior vena cava is normal in size with greater than 50% respiratory variability, suggesting right atrial pressure of 3 mmHg. 7. FINDINGS Left Ventricle: Endocarium not well seen septal apical akinesis inferior wall hypokinesis. Left ventricular ejection fraction, by estimation, is 35 to 40%. The left ventricle has moderately decreased function. The left ventricle demonstrates regional wall motion abnormalities. The left ventricular internal cavity size was Final  Cath 11/06/20   Prox LAD to Mid LAD lesion is 85%  stenosed.   Prox LAD lesion is 100% stenosed.   Dist Cx lesion is 100% stenosed.   Prox RCA to Dist RCA lesion is 100% stenosed.   Ost 1st Diag lesion is 70% stenosed.   Ost 2nd Diag to 2nd Diag lesion is 70% stenosed.   Ost 3rd Mrg lesion is 100% stenosed.   Mid Graft to Dist Graft lesion before 1st Mrg  is 100% stenosed.   Ost LAD to Prox LAD lesion is 100% stenosed.   Ost Cx to Dist Cx lesion is 100% stenosed.   A drug-eluting stent was successfully placed using a STENT ONYX FRONTIER 3.5X30.   Post intervention, there is a 0% residual stenosis.   SVG and is large.   There is mild left ventricular systolic dysfunction.   LV end diastolic pressure is severely elevated.   The left ventricular ejection fraction is 45-50% by visual estimate.   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 Patent LIMA to the LAD Patent SVG to the PDA Occluded SVG sequentially to OM1 and OM2 Mild LV dysfunction. EF 45%.  Elevated LVEDP 30 mm Hg Successful PCI of the SVG to OM1 and OM2 with aspiration thrombectomy and DES x 1   Plan: DAPT for one year. Continue pressor support- wean as tolerated. Angiomax discontinued- plan to remove sheath in 2 hours.      PLAN:  In order of problems listed above:  Dyspnea-EF normal by MRI BNP normal and CTA no PE on 04/29/21  stable   CAD s/p CABG X 4  in 2009, STEMI 10/2020 PCI/DES with aspiration thrombectomy to SVG-OM1/OM2. No angina on plavix and ASA/statin  ICM EF normal by MRI 06/06/21 55% with minimal LAD subendocardial scar  On metoprolol, lisinopril HCTZ.    HLD LDL 89 on atorvastatin and zetia   PAD left iliac stent and bilateral SFA occlusion, 04/07/21 ABI's stable and patent stent.F/U [facility]   DM2 per PCP  Prostate:  BPH on Flomax and Proscar  F/U Dr Truman Hayward to hold plavix for procedures    Back:  to have stimulator placed see above ok to proceed and hold plavix for 5 days    F/U [facility] next available   F/U me in a year     Signed, Charlton Haws, MD  06/16/2022 8:08 AM    [facility] [address] [place], [place], [place]  [address] Phone: [phone]; Fax: [phone]

## 2022-06-13 DIAGNOSIS — J301 Allergic rhinitis due to pollen: Secondary | ICD-10-CM | POA: Diagnosis not present

## 2022-06-13 DIAGNOSIS — Z8616 Personal history of COVID-19: Secondary | ICD-10-CM | POA: Diagnosis not present

## 2022-06-13 DIAGNOSIS — J4540 Moderate persistent asthma, uncomplicated: Secondary | ICD-10-CM | POA: Diagnosis not present

## 2022-06-13 DIAGNOSIS — R0602 Shortness of breath: Secondary | ICD-10-CM | POA: Diagnosis not present

## 2022-06-16 ENCOUNTER — Ambulatory Visit: Payer: Medicare HMO | Attending: Cardiovascular Disease | Admitting: Cardiovascular Disease

## 2022-06-16 VITALS — BP 122/50 | HR 59 | Ht 66.0 in | Wt 152.0 lb

## 2022-06-16 DIAGNOSIS — Z951 Presence of aortocoronary bypass graft: Secondary | ICD-10-CM

## 2022-06-16 DIAGNOSIS — I255 Ischemic cardiomyopathy: Secondary | ICD-10-CM | POA: Diagnosis not present

## 2022-06-16 DIAGNOSIS — R0602 Shortness of breath: Secondary | ICD-10-CM

## 2022-06-16 DIAGNOSIS — I739 Peripheral vascular disease, unspecified: Secondary | ICD-10-CM

## 2022-06-16 DIAGNOSIS — I25118 Atherosclerotic heart disease of native coronary artery with other forms of angina pectoris: Secondary | ICD-10-CM | POA: Diagnosis not present

## 2022-06-16 DIAGNOSIS — Z79899 Other long term (current) drug therapy: Secondary | ICD-10-CM | POA: Diagnosis not present

## 2022-06-16 NOTE — Patient Instructions (Addendum)
Medication Instructions:  Your physician recommends that you continue on your current medications as directed. Please refer to the Current Medication list given to you today.  *If you need a refill on your cardiac medications before your next appointment, please call your pharmacy*  Lab Work: If you have labs (blood work) drawn today and your tests are completely normal, you will receive your results only by: MyChart Message (if you have MyChart) OR A paper copy in the mail If you have any lab test that is abnormal or we need to change your treatment, we will call you to review the results.  Testing/Procedures: None ordered today.  Follow-Up: At [facility], you and your health needs are our priority.  As part of our continuing mission to provide you with exceptional heart care, we have created designated Provider Care Teams.  These Care Teams include your primary Cardiologist (physician) and Advanced Practice Providers (APPs -  Physician Assistants and Nurse Practitioners) who all work together to provide you with the care you need, when you need it.  We recommend signing up for the patient portal called "MyChart".  Sign up information is provided on this After Visit Summary.  MyChart is used to connect with patients for Virtual Visits (Telemedicine).  Patients are able to view lab/test results, encounter notes, upcoming appointments, etc.  Non-urgent messages can be sent to your provider as well.   To learn more about what you can do with MyChart, go to [email or web address].    Your next appointment:   6 month(s)  Provider:   Peter Nishan, MD     

## 2022-06-20 DIAGNOSIS — M961 Postlaminectomy syndrome, not elsewhere classified: Secondary | ICD-10-CM | POA: Diagnosis not present

## 2022-07-05 ENCOUNTER — Ambulatory Visit: Payer: Medicare Other | Admitting: Cardiovascular Disease

## 2022-07-20 DIAGNOSIS — K5909 Other constipation: Secondary | ICD-10-CM | POA: Diagnosis not present

## 2022-07-20 DIAGNOSIS — R634 Abnormal weight loss: Secondary | ICD-10-CM | POA: Diagnosis not present

## 2022-07-20 DIAGNOSIS — K219 Gastro-esophageal reflux disease without esophagitis: Secondary | ICD-10-CM | POA: Diagnosis not present

## 2022-08-07 DIAGNOSIS — Z8616 Personal history of COVID-19: Secondary | ICD-10-CM | POA: Diagnosis not present

## 2022-08-07 DIAGNOSIS — J301 Allergic rhinitis due to pollen: Secondary | ICD-10-CM | POA: Diagnosis not present

## 2022-08-07 DIAGNOSIS — R0602 Shortness of breath: Secondary | ICD-10-CM | POA: Diagnosis not present

## 2022-08-07 DIAGNOSIS — J4540 Moderate persistent asthma, uncomplicated: Secondary | ICD-10-CM | POA: Diagnosis not present

## 2022-08-08 ENCOUNTER — Other Ambulatory Visit: Payer: Self-pay

## 2022-08-08 MED ORDER — CLOPIDOGREL BISULFATE 75 MG PO TABS: 75.0000 mg | ORAL_TABLET | Freq: Every day | ORAL | 3 refills | Status: DC

## 2022-08-10 DIAGNOSIS — D519 Vitamin B12 deficiency anemia, unspecified: Secondary | ICD-10-CM | POA: Diagnosis not present

## 2022-08-10 DIAGNOSIS — E119 Type 2 diabetes mellitus without complications: Secondary | ICD-10-CM | POA: Diagnosis not present

## 2022-08-10 DIAGNOSIS — I10 Essential (primary) hypertension: Secondary | ICD-10-CM | POA: Diagnosis not present

## 2022-08-10 DIAGNOSIS — Z6824 Body mass index (BMI) 24.0-24.9, adult: Secondary | ICD-10-CM | POA: Diagnosis not present

## 2022-08-11 DIAGNOSIS — K219 Gastro-esophageal reflux disease without esophagitis: Secondary | ICD-10-CM | POA: Diagnosis not present

## 2022-08-11 DIAGNOSIS — E785 Hyperlipidemia, unspecified: Secondary | ICD-10-CM | POA: Diagnosis not present

## 2022-08-11 DIAGNOSIS — J301 Allergic rhinitis due to pollen: Secondary | ICD-10-CM | POA: Diagnosis not present

## 2022-08-11 DIAGNOSIS — K5900 Constipation, unspecified: Secondary | ICD-10-CM | POA: Diagnosis not present

## 2022-08-11 DIAGNOSIS — Z79899 Other long term (current) drug therapy: Secondary | ICD-10-CM | POA: Diagnosis not present

## 2022-08-11 DIAGNOSIS — M1990 Unspecified osteoarthritis, unspecified site: Secondary | ICD-10-CM | POA: Diagnosis not present

## 2022-08-11 DIAGNOSIS — N529 Male erectile dysfunction, unspecified: Secondary | ICD-10-CM | POA: Diagnosis not present

## 2022-08-11 DIAGNOSIS — G3184 Mild cognitive impairment, so stated: Secondary | ICD-10-CM | POA: Diagnosis not present

## 2022-08-11 DIAGNOSIS — M545 Low back pain, unspecified: Secondary | ICD-10-CM | POA: Diagnosis not present

## 2022-08-11 DIAGNOSIS — I252 Old myocardial infarction: Secondary | ICD-10-CM | POA: Diagnosis not present

## 2022-08-11 DIAGNOSIS — F419 Anxiety disorder, unspecified: Secondary | ICD-10-CM | POA: Diagnosis not present

## 2022-08-11 DIAGNOSIS — E1142 Type 2 diabetes mellitus with diabetic polyneuropathy: Secondary | ICD-10-CM | POA: Diagnosis not present

## 2022-09-04 DIAGNOSIS — R410 Disorientation, unspecified: Secondary | ICD-10-CM | POA: Diagnosis not present

## 2022-09-12 ENCOUNTER — Ambulatory Visit: Payer: Medicare HMO | Admitting: Podiatry

## 2022-09-12 DIAGNOSIS — M79676 Pain in unspecified toe(s): Secondary | ICD-10-CM | POA: Diagnosis not present

## 2022-09-12 DIAGNOSIS — B351 Tinea unguium: Secondary | ICD-10-CM | POA: Diagnosis not present

## 2022-09-12 DIAGNOSIS — D519 Vitamin B12 deficiency anemia, unspecified: Secondary | ICD-10-CM | POA: Diagnosis not present

## 2022-09-12 DIAGNOSIS — E119 Type 2 diabetes mellitus without complications: Secondary | ICD-10-CM

## 2022-09-12 NOTE — Progress Notes (Signed)
  Subjective:  Patient ID: Ricardo Le, male    DOB: 1940/06/23,  MRN: [id number]  Chief Complaint  Patient presents with   Nail Problem    Routine Foot Care-nail trim   Nail fungus to left hallux    82 y.o. male presents with the above complaint. History confirmed with patient. Patient presenting with pain related to dystrophic thickened elongated nails. Patient is unable to trim own nails related to nail dystrophy and/or mobility issues. Patient does have a history of T2DM. Objective:  Physical Exam: warm, good capillary refill nail exam onychomycosis of the toenails, onycholysis, and dystrophic nails DP pulses palpable, PT pulses palpable, and protective sensation absent Left Foot:  Pain with palpation of nails due to elongation and dystrophic growth.  Right Foot: Pain with palpation of nails due to elongation and dystrophic growth.   Assessment:   1. Pain due to onychomycosis of toenail   2. Diabetes mellitus without complication (HCC)       Plan:  Patient was evaluated and treated and all questions answered.  #Onychomycosis with pain  -Nails palliatively debrided as below. -Educated on self-care  Procedure: Nail Debridement Rationale: Pain Type of Debridement: manual, sharp debridement. Instrumentation: Nail nipper, rotary burr. Number of Nails: 10  Return in about 3 months (around 12/13/2022) for [facility].         Corinna Gab, DPM [facility]

## 2022-09-13 ENCOUNTER — Ambulatory Visit: Payer: Medicare HMO | Attending: Cardiovascular Disease | Admitting: Cardiovascular Disease

## 2022-09-13 ENCOUNTER — Encounter: Payer: Self-pay | Admitting: Cardiovascular Disease

## 2022-09-13 VITALS — BP 90/62 | HR 53 | Ht 65.0 in | Wt 150.4 lb

## 2022-09-13 DIAGNOSIS — E785 Hyperlipidemia, unspecified: Secondary | ICD-10-CM

## 2022-09-13 DIAGNOSIS — I739 Peripheral vascular disease, unspecified: Secondary | ICD-10-CM

## 2022-09-13 DIAGNOSIS — I255 Ischemic cardiomyopathy: Secondary | ICD-10-CM | POA: Diagnosis not present

## 2022-09-13 DIAGNOSIS — R079 Chest pain, unspecified: Secondary | ICD-10-CM | POA: Diagnosis not present

## 2022-09-13 DIAGNOSIS — I2510 Atherosclerotic heart disease of native coronary artery without angina pectoris: Secondary | ICD-10-CM | POA: Diagnosis not present

## 2022-09-13 DIAGNOSIS — I5022 Chronic systolic (congestive) heart failure: Secondary | ICD-10-CM | POA: Diagnosis not present

## 2022-09-13 DIAGNOSIS — I6523 Occlusion and stenosis of bilateral carotid arteries: Secondary | ICD-10-CM | POA: Diagnosis not present

## 2022-09-13 MED ORDER — LISINOPRIL 20 MG PO TABS: 20.0000 mg | ORAL_TABLET | Freq: Every day | ORAL | 3 refills | Status: DC

## 2022-09-13 NOTE — Progress Notes (Signed)
Cardiology Office Note   Date:  09/13/2022   ID:  Ricardo Le, DOB [birth year], MRN [id number]  PCP:  Noni Saupe, MD  Cardiologist:  Dr. Eden Emms  Chief Complaint  Patient presents with   Follow-up    6 month f/u pt would like to discuss carotids. Meds reviewed verbally with pt.       History of Present Illness: Ricardo Le is a 82 y.o. male who presents for a follow up visit regarding PAD and coronary artery disease. He has known history of coronary artery disease status post coronary artery bypass grafting in 2009 .   Other past medical history include remote history of pancreatic cancer which was treated successfully. He is status post left common iliac artery stent placement in March 2016. He is known to have bilateral SFA occlusion with 1 vessel runoff below the knee bilaterally. He had previous neck surgery in 2016 with improvement in his neuropathic pain. He was hospitalized in June, 2020 with small subarachnoid hemorrhage which did not require intervention.   He had recurrent issues with postprandial abdominal pain.  He was seen by vascular surgery at [facility] for possible chronic mesenteric ischemia.  His abdominal pain was occasionally postprandial but also worse with walking and partially relieved by nitroglycerin.  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  He did undergo mesenteric angiogram via a left brachial artery cutdown in April of 2021.  This showed no significant stenosis affecting the celiac artery or superior mesenteric artery.  He presented in September 2022 with inferior posterior ST elevation myocardial infarction.  He underwent cardiac catheterization which showed patent grafts with exception of an occluded SVG to OM1 and OM 2.  He underwent successful PCI with aspiration thrombectomy and drug-eluting stent placement.  Echocardiogram showed an EF of 35 to 40%.   In spite of his peripheral  arterial disease, he denies claudication.  His wife reports worsening memory and possible early dementia.  His chronic abdominal pain seems to be improved and he denies chest pain or worsening dyspnea. He does report recent decrease in his blood pressure.  Past Medical History:  Diagnosis Date   CAD (coronary artery disease)    a. 04/2007 s/p CABG x 4  - LIMA->LAD, VG->OM1->Om2, VG->PDA;  b. 12/2008 & 06/2010 Caths - Native multivessel dzs with 4/4 patent grafts. c. cath 03/09/2015 patent grafts, unchanged from prior cath.   Cancer [facility])    pancreatic  March 2011   Carotid bruit    a. 07/2010 U/S- 0-39% bilat ICA stenosis   Chest pain    Noncardiac probably related to reflux   DDD (degenerative disc disease)    several surgeries   Fracture acetabulum-closed (HCC) 07/08/2018   GERD (gastroesophageal reflux disease)    HNP (herniated nucleus pulposus), lumbar    a. L2-3, s/p laminotomy, microdiskectomy 02/2009   HOH (hard of hearing)    uses amplifiers    Nephrolithiasis    PAD (peripheral artery disease) (HCC) 04/2014   s/p L Common Iliac Stent   Palpitations    Pancreatic mass    a. 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   Pneumonia    hx of  pna   Pure hypercholesterolemia    statin intolerance   Type II or unspecified type diabetes mellitus without mention of complication, not stated as uncontrolled    insulin dependent   Unspecified essential hypertension  Past Surgical History:  Procedure Laterality Date   ABDOMINAL AORTAGRAM N/A 05/13/2014   Procedure: ABDOMINAL AORTAGRAM;  Surgeon: Iran Ouch, MD;  Location: [facility] CATH LAB;  Service: Cardiovascular;  Laterality: N/A;   ANTERIOR CERVICAL DECOMP/DISCECTOMY FUSION N/A 07/28/2014   Procedure: ANTERIOR CERVICAL DECOMPRESSION/DISCECTOMY FUSION CERVICAL FOUR-FIVE CERVICAL FIVE-SIX ;  Surgeon: Julio Sicks, MD;  Location: [facility] NEURO ORS;  Service: Neurosurgery;  Laterality: N/A;   APPENDECTOMY     arthroscopy  right knee     BACK SURGERY     CARDIAC CATHETERIZATION N/A 03/09/2015   Procedure: Left Heart Cath and Cors/Grafts Angiography;  Surgeon: Lennette Bihari, MD;  Location: [facility] INVASIVE CV LAB;  Service: Cardiovascular;  Laterality: N/A;   CHOLECYSTECTOMY     CORONARY ARTERY BYPASS GRAFT     X 4 2009 (LIMA to LAD, SVG to first cicumflex marginal branch with sequential SVG to second cicumflex marginal branch  and saphenous vein to posterior descending coronary artery with endoscopic,vein harvest rt. lower exttremity by Dr.Owen on March 31,2009   CORONARY/GRAFT ACUTE MI REVASCULARIZATION N/A 11/06/2020   Procedure: Coronary/Graft Acute MI Revascularization;  Surgeon: Swaziland, Peter M, MD;  Location: [facility] INVASIVE CV LAB;  Service: Cardiovascular;  Laterality: N/A;   CYST REMOVED     FROM SPINE   CYSTOURETHROSCOPY     right retrograde pyelogram,manipulate stone in the renal pelvis, rt. double-j catheter.   EYE SURGERY     KNEE SURGERY     rt knee   LEFT HEART CATH AND CORONARY ANGIOGRAPHY N/A 11/06/2020   Procedure: LEFT HEART CATH AND CORONARY ANGIOGRAPHY;  Surgeon: Swaziland, Peter M, MD;  Location: [facility] INVASIVE CV LAB;  Service: Cardiovascular;  Laterality: N/A;   LEFT HEART CATH AND CORS/GRAFTS ANGIOGRAPHY N/A 02/20/2017   Procedure: LEFT HEART CATH AND CORS/GRAFTS ANGIOGRAPHY;  Surgeon: Marykay Lex, MD;  Location: [facility] INVASIVE CV LAB;  Service: Cardiovascular;  Laterality: N/A;   LEFT HEART CATH AND CORS/GRAFTS ANGIOGRAPHY N/A 05/28/2018   Procedure: LEFT HEART CATH AND CORS/GRAFTS ANGIOGRAPHY;  Surgeon: Kathleene Hazel, MD;  Location: [facility] INVASIVE CV LAB;  Service: Cardiovascular;  Laterality: N/A;   NEPHROLITHOTOMY     PERCUTANEOUS   PARS PLANA VITRECTOMY     lt. eye,retinal photocoagulation lt. eye, membrane peel lt. eye.   PERCUTANEOUS STENT INTERVENTION Left 05/13/2014   Procedure: PERCUTANEOUS STENT INTERVENTION;  Surgeon: Iran Ouch, MD;  Location: [facility] CATH LAB;  Service:  Cardiovascular;  Laterality: Left;  COMMON ILLIAC   re-exploratin of laminectomy  05/2008   (RT L2-3) WITH REDO MICRODISKECTOMY     Current Outpatient Medications  Medication Sig Dispense Refill   acetaminophen (TYLENOL) 325 MG tablet Take 650 mg by mouth every 6 (six) hours as needed (for pain).      albuterol (VENTOLIN HFA) 108 (90 Base) MCG/ACT inhaler Inhale 2 puffs into the lungs every 6 (six) hours as needed for wheezing or shortness of breath.     aspirin EC 81 MG tablet Take 81 mg by mouth daily.     atorvastatin (LIPITOR) 40 MG tablet Take 1 tablet (40 mg total) by mouth daily. 90 tablet 1   BD PEN NEEDLE NANO 2ND GEN 32G X 4 MM MISC 1 each by Other route daily.     bisacodyl (DULCOLAX) 5 MG EC tablet Take 5 mg by mouth every 3 (three) days.      Blood Glucose Monitoring Suppl (ONETOUCH VERIO FLEX SYSTEM) w/Device KIT 1 each by Other route as directed.  clopidogrel (PLAVIX) 75 MG tablet Take 1 tablet (75 mg total) by mouth daily. 90 tablet 3   glimepiride (AMARYL) 2 MG tablet Take 4 mg by mouth daily.     Insulin Glargine (LANTUS) 100 UNIT/ML Solostar Pen Inject 18 Units into the skin at bedtime. (Patient taking differently: Inject 14 Units into the skin at bedtime.) 15 mL 11   lisinopril-hydrochlorothiazide (ZESTORETIC) 20-12.5 MG tablet Take 1 tablet by mouth daily.     Melatonin 3 MG TABS Take 1 tablet (3 mg total) by mouth at bedtime. 30 tablet 0   metoprolol succinate (TOPROL-XL) 25 MG 24 hr tablet Take 1 tablet (25 mg total) by mouth daily. 90 tablet 1   Multiple Vitamin (MULTIVITAMIN WITH MINERALS) TABS tablet Take 1 tablet by mouth daily.     nitroGLYCERIN (NITROSTAT) 0.4 MG SL tablet DISSOLVE 1 TABLET UNDER THE TONGUE EVERY 5 MINUTES AS NEEDED FOR CHEST PAIN 25 tablet 11   ONETOUCH VERIO test strip 1 each by Other route 2 (two) times daily.     pantoprazole (PROTONIX) 40 MG tablet Take 40 mg by mouth 2 (two) times daily.     tamsulosin (FLOMAX) 0.4 MG CAPS capsule Take 1  capsule (0.4 mg total) by mouth daily after supper. 30 capsule 0   traMADol (ULTRAM) 50 MG tablet Take 50 mg by mouth 2 (two) times daily as needed for moderate pain.     No current facility-administered medications for this visit.    Allergies:   Dicyclomine, Ioversol, Oxycodone hcl, Ranexa [ranolazine], Rosuvastatin calcium, Rosuvastatin calcium, Tramadol, Iodinated contrast media, Iohexol, and Statins    Social History:  The patient  reports that he has never smoked. He has never used smokeless tobacco. He reports that he does not drink alcohol and does not use drugs.   Family History:  The patient's family history includes Coronary artery disease in his brother and sister; Diabetes in his brother; Heart attack in his father; Other in his mother and another family member.    ROS:  Please see the history of present illness.   Otherwise, review of systems are positive for none.   All other systems are reviewed and negative.    PHYSICAL EXAM: VS:  BP 90/62 (BP Location: Left Arm, Patient Position: Sitting, Cuff Size: Normal)   Pulse (!) 53   Ht 5\' 5"  (1.651 m)   Wt 150 lb 6 oz (68.2 kg)   SpO2 96%   BMI 25.02 kg/m  , BMI Body mass index is 25.02 kg/m. GEN: Well nourished, well developed, in no acute distress  HEENT: normal  Neck: no JVD, or masses. Bilateral carotid bruit.  Cardiac: RRR; no  rubs, or gallops,no edema . 2/6 SEM at the base.  Respiratory:  clear to auscultation bilaterally, normal work of breathing GI: soft, nontender, nondistended, + BS MS: no deformity or atrophy  Skin: warm and dry, no rash Neuro:  Strength and sensation are intact Psych: euthymic mood, full affect   EKG:  EKG is not ordered today.    Recent Labs: No results found for requested labs within last 365 days.    Lipid Panel    Component Value Date/Time   CHOL 164 11/06/2020 1801   CHOL 210 (H) 09/08/2019 1506   TRIG 123 11/06/2020 1801   HDL 50 11/06/2020 1801   HDL 48 09/08/2019 1506    CHOLHDL 3.3 11/06/2020 1801   VLDL 25 11/06/2020 1801   LDLCALC 89 11/06/2020 1801   LDLCALC 145 (H) 09/08/2019  1506      Wt Readings from Last 3 Encounters:  09/13/22 150 lb 6 oz (68.2 kg)  06/16/22 152 lb (68.9 kg)  01/03/22 150 lb 12.8 oz (68.4 kg)         ASSESSMENT AND PLAN:  1.  Peripheral arterial disease: Status post left iliac artery stenting. He is known to have bilateral SFA occlusion.  He denies claudication at this time.  Continue medical therapy.  2. Coronary artery disease involving native coronary arteries with recent myocardial infarction status post PCI and drug-eluting stent placement to SVG to OM.   Continue dual antiplatelet therapy with aspirin and clopidogrel given PCI on SVG for an occluded graft.  3.  Chronic systolic heart failure due to ischemic cardiomyopathy: Moderately reduced ejection fraction.  Most recent echocardiogram in March 2023 showed an EF of 50 to 55%.  Continue Toprol and lisinopril.  4. Hyperlipidemia: Continue treatment with atorvastatin with a target LDL of less than 70.  5. Essential hypertension: His blood pressure is low and he complains of mild dizziness.  I elected to stop small dose hydrochlorothiazide and continue lisinopril and metoprolol.  6. Bilateral carotid disease: Most recent carotid Doppler in 2023 showed mild nonobstructive disease.    Disposition:   FU with me in 12 months  Signed, Lorine Bears, MD  09/13/2022 11:11 AM     [facility]

## 2022-09-13 NOTE — Patient Instructions (Signed)
Medication Instructions:  STOP the Hydrochlorothiazide-Lisinopril  START Lisinopril 20 mg once daily  *If you need a refill on your cardiac medications before your next appointment, please call your pharmacy*   Lab Work: None ordered If you have labs (blood work) drawn today and your tests are completely normal, you will receive your results only by: MyChart Message (if you have MyChart) OR A paper copy in the mail If you have any lab test that is abnormal or we need to change your treatment, we will call you to review the results.   Testing/Procedures: None ordered   Follow-Up: At [facility], you and your health needs are our priority.  As part of our continuing mission to provide you with exceptional heart care, we have created designated Provider Care Teams.  These Care Teams include your primary Cardiologist (physician) and Advanced Practice Providers (APPs -  Physician Assistants and Nurse Practitioners) who all work together to provide you with the care you need, when you need it.  We recommend signing up for the patient portal called "MyChart".  Sign up information is provided on this After Visit Summary.  MyChart is used to connect with patients for Virtual Visits (Telemedicine).  Patients are able to view lab/test results, encounter notes, upcoming appointments, etc.  Non-urgent messages can be sent to your provider as well.   To learn more about what you can do with MyChart, go to [email or web address].    Your next appointment:   12 month(s)  Provider:   You may see Dr. Kirke Corin or one of the following Advanced Practice Providers on your designated Care Team:   Nicolasa Ducking, NP Eula Listen, PA-C Cadence Fransico Michael, PA-C Charlsie Quest, NP

## 2022-11-03 ENCOUNTER — Encounter (INDEPENDENT_AMBULATORY_CARE_PROVIDER_SITE_OTHER): Payer: Medicare HMO | Admitting: Ophthalmology

## 2022-11-13 DIAGNOSIS — J301 Allergic rhinitis due to pollen: Secondary | ICD-10-CM | POA: Diagnosis not present

## 2022-11-13 DIAGNOSIS — R0602 Shortness of breath: Secondary | ICD-10-CM | POA: Diagnosis not present

## 2022-11-13 DIAGNOSIS — J4540 Moderate persistent asthma, uncomplicated: Secondary | ICD-10-CM | POA: Diagnosis not present

## 2022-11-13 DIAGNOSIS — Z8616 Personal history of COVID-19: Secondary | ICD-10-CM | POA: Diagnosis not present

## 2022-11-21 DIAGNOSIS — D519 Vitamin B12 deficiency anemia, unspecified: Secondary | ICD-10-CM | POA: Diagnosis not present

## 2022-11-21 DIAGNOSIS — E1165 Type 2 diabetes mellitus with hyperglycemia: Secondary | ICD-10-CM | POA: Diagnosis not present

## 2022-11-27 ENCOUNTER — Encounter: Payer: Self-pay | Admitting: Neurology

## 2022-11-27 ENCOUNTER — Ambulatory Visit: Payer: Medicare HMO | Admitting: Neurology

## 2022-12-04 ENCOUNTER — Encounter (INDEPENDENT_AMBULATORY_CARE_PROVIDER_SITE_OTHER): Payer: Medicare HMO | Admitting: Ophthalmology

## 2022-12-05 DIAGNOSIS — E1165 Type 2 diabetes mellitus with hyperglycemia: Secondary | ICD-10-CM | POA: Diagnosis not present

## 2022-12-25 DIAGNOSIS — Z1331 Encounter for screening for depression: Secondary | ICD-10-CM | POA: Diagnosis not present

## 2022-12-25 DIAGNOSIS — E1165 Type 2 diabetes mellitus with hyperglycemia: Secondary | ICD-10-CM | POA: Diagnosis not present

## 2022-12-25 DIAGNOSIS — Z1339 Encounter for screening examination for other mental health and behavioral disorders: Secondary | ICD-10-CM | POA: Diagnosis not present

## 2022-12-25 DIAGNOSIS — Z0000 Encounter for general adult medical examination without abnormal findings: Secondary | ICD-10-CM | POA: Diagnosis not present

## 2022-12-26 ENCOUNTER — Ambulatory Visit: Payer: Medicare HMO | Admitting: Cardiovascular Disease

## 2023-01-01 DIAGNOSIS — Z974 Presence of external hearing-aid: Secondary | ICD-10-CM | POA: Diagnosis not present

## 2023-01-01 DIAGNOSIS — H9193 Unspecified hearing loss, bilateral: Secondary | ICD-10-CM | POA: Diagnosis not present

## 2023-01-06 ENCOUNTER — Emergency Department (HOSPITAL_COMMUNITY): Payer: Medicare HMO

## 2023-01-06 ENCOUNTER — Observation Stay (HOSPITAL_COMMUNITY): Payer: Medicare HMO

## 2023-01-06 ENCOUNTER — Encounter (HOSPITAL_COMMUNITY): Payer: Self-pay

## 2023-01-06 ENCOUNTER — Other Ambulatory Visit: Payer: Self-pay

## 2023-01-06 ENCOUNTER — Inpatient Hospital Stay (HOSPITAL_COMMUNITY)
Admission: EM | Admit: 2023-01-06 | Discharge: 2023-01-08 | DRG: 065 | Disposition: A | Discharge status: Home Health | Payer: Medicare HMO | Attending: Internal Medicine | Admitting: Internal Medicine

## 2023-01-06 DIAGNOSIS — G4700 Insomnia, unspecified: Secondary | ICD-10-CM | POA: Diagnosis present

## 2023-01-06 DIAGNOSIS — F1120 Opioid dependence, uncomplicated: Secondary | ICD-10-CM | POA: Diagnosis present

## 2023-01-06 DIAGNOSIS — R410 Disorientation, unspecified: Secondary | ICD-10-CM | POA: Diagnosis not present

## 2023-01-06 DIAGNOSIS — Z888 Allergy status to other drugs, medicaments and biological substances status: Secondary | ICD-10-CM

## 2023-01-06 DIAGNOSIS — Z87828 Personal history of other (healed) physical injury and trauma: Secondary | ICD-10-CM

## 2023-01-06 DIAGNOSIS — E785 Hyperlipidemia, unspecified: Secondary | ICD-10-CM | POA: Diagnosis present

## 2023-01-06 DIAGNOSIS — R2990 Unspecified symptoms and signs involving the nervous system: Principal | ICD-10-CM

## 2023-01-06 DIAGNOSIS — Z91041 Radiographic dye allergy status: Secondary | ICD-10-CM

## 2023-01-06 DIAGNOSIS — I6782 Cerebral ischemia: Secondary | ICD-10-CM | POA: Diagnosis not present

## 2023-01-06 DIAGNOSIS — R5383 Other fatigue: Secondary | ICD-10-CM | POA: Diagnosis not present

## 2023-01-06 DIAGNOSIS — E1151 Type 2 diabetes mellitus with diabetic peripheral angiopathy without gangrene: Secondary | ICD-10-CM | POA: Diagnosis present

## 2023-01-06 DIAGNOSIS — I10 Essential (primary) hypertension: Secondary | ICD-10-CM | POA: Diagnosis not present

## 2023-01-06 DIAGNOSIS — I5020 Unspecified systolic (congestive) heart failure: Secondary | ICD-10-CM | POA: Diagnosis present

## 2023-01-06 DIAGNOSIS — I5042 Chronic combined systolic (congestive) and diastolic (congestive) heart failure: Secondary | ICD-10-CM | POA: Diagnosis present

## 2023-01-06 DIAGNOSIS — H9190 Unspecified hearing loss, unspecified ear: Secondary | ICD-10-CM | POA: Diagnosis present

## 2023-01-06 DIAGNOSIS — Z951 Presence of aortocoronary bypass graft: Secondary | ICD-10-CM

## 2023-01-06 DIAGNOSIS — Z7984 Long term (current) use of oral hypoglycemic drugs: Secondary | ICD-10-CM

## 2023-01-06 DIAGNOSIS — E1159 Type 2 diabetes mellitus with other circulatory complications: Secondary | ICD-10-CM | POA: Diagnosis present

## 2023-01-06 DIAGNOSIS — I639 Cerebral infarction, unspecified: Secondary | ICD-10-CM | POA: Diagnosis present

## 2023-01-06 DIAGNOSIS — L8990 Pressure ulcer of unspecified site, unspecified stage: Secondary | ICD-10-CM

## 2023-01-06 DIAGNOSIS — R29818 Other symptoms and signs involving the nervous system: Secondary | ICD-10-CM | POA: Diagnosis not present

## 2023-01-06 DIAGNOSIS — Z743 Need for continuous supervision: Secondary | ICD-10-CM | POA: Diagnosis not present

## 2023-01-06 DIAGNOSIS — I6623 Occlusion and stenosis of bilateral posterior cerebral arteries: Secondary | ICD-10-CM | POA: Diagnosis not present

## 2023-01-06 DIAGNOSIS — F05 Delirium due to known physiological condition: Secondary | ICD-10-CM | POA: Diagnosis not present

## 2023-01-06 DIAGNOSIS — Z9181 History of falling: Secondary | ICD-10-CM

## 2023-01-06 DIAGNOSIS — R9082 White matter disease, unspecified: Secondary | ICD-10-CM | POA: Diagnosis not present

## 2023-01-06 DIAGNOSIS — Z981 Arthrodesis status: Secondary | ICD-10-CM

## 2023-01-06 DIAGNOSIS — R278 Other lack of coordination: Secondary | ICD-10-CM | POA: Diagnosis present

## 2023-01-06 DIAGNOSIS — N400 Enlarged prostate without lower urinary tract symptoms: Secondary | ICD-10-CM | POA: Diagnosis present

## 2023-01-06 DIAGNOSIS — Z7902 Long term (current) use of antithrombotics/antiplatelets: Secondary | ICD-10-CM

## 2023-01-06 DIAGNOSIS — Z86718 Personal history of other venous thrombosis and embolism: Secondary | ICD-10-CM

## 2023-01-06 DIAGNOSIS — F0154 Vascular dementia, unspecified severity, with anxiety: Secondary | ICD-10-CM | POA: Diagnosis present

## 2023-01-06 DIAGNOSIS — I959 Hypotension, unspecified: Secondary | ICD-10-CM | POA: Diagnosis not present

## 2023-01-06 DIAGNOSIS — I2510 Atherosclerotic heart disease of native coronary artery without angina pectoris: Secondary | ICD-10-CM | POA: Diagnosis present

## 2023-01-06 DIAGNOSIS — Z7901 Long term (current) use of anticoagulants: Secondary | ICD-10-CM

## 2023-01-06 DIAGNOSIS — E1165 Type 2 diabetes mellitus with hyperglycemia: Secondary | ICD-10-CM | POA: Diagnosis present

## 2023-01-06 DIAGNOSIS — E7800 Pure hypercholesterolemia, unspecified: Secondary | ICD-10-CM | POA: Diagnosis present

## 2023-01-06 DIAGNOSIS — I63511 Cerebral infarction due to unspecified occlusion or stenosis of right middle cerebral artery: Principal | ICD-10-CM | POA: Diagnosis present

## 2023-01-06 DIAGNOSIS — Z833 Family history of diabetes mellitus: Secondary | ICD-10-CM

## 2023-01-06 DIAGNOSIS — F01518 Vascular dementia, unspecified severity, with other behavioral disturbance: Secondary | ICD-10-CM | POA: Diagnosis present

## 2023-01-06 DIAGNOSIS — H5500 Unspecified nystagmus: Secondary | ICD-10-CM | POA: Diagnosis present

## 2023-01-06 DIAGNOSIS — I110 Hypertensive heart disease with heart failure: Secondary | ICD-10-CM | POA: Diagnosis present

## 2023-01-06 DIAGNOSIS — G8194 Hemiplegia, unspecified affecting left nondominant side: Secondary | ICD-10-CM | POA: Diagnosis present

## 2023-01-06 DIAGNOSIS — Z9081 Acquired absence of spleen: Secondary | ICD-10-CM

## 2023-01-06 DIAGNOSIS — R29703 NIHSS score 3: Secondary | ICD-10-CM | POA: Diagnosis present

## 2023-01-06 DIAGNOSIS — R270 Ataxia, unspecified: Secondary | ICD-10-CM | POA: Diagnosis present

## 2023-01-06 DIAGNOSIS — I672 Cerebral atherosclerosis: Secondary | ICD-10-CM | POA: Diagnosis not present

## 2023-01-06 DIAGNOSIS — Z9049 Acquired absence of other specified parts of digestive tract: Secondary | ICD-10-CM

## 2023-01-06 DIAGNOSIS — Z7982 Long term (current) use of aspirin: Secondary | ICD-10-CM

## 2023-01-06 DIAGNOSIS — Z8249 Family history of ischemic heart disease and other diseases of the circulatory system: Secondary | ICD-10-CM

## 2023-01-06 DIAGNOSIS — Z87820 Personal history of traumatic brain injury: Secondary | ICD-10-CM

## 2023-01-06 DIAGNOSIS — Z885 Allergy status to narcotic agent status: Secondary | ICD-10-CM

## 2023-01-06 DIAGNOSIS — R001 Bradycardia, unspecified: Secondary | ICD-10-CM | POA: Diagnosis present

## 2023-01-06 DIAGNOSIS — F01511 Vascular dementia, unspecified severity, with agitation: Secondary | ICD-10-CM | POA: Diagnosis present

## 2023-01-06 DIAGNOSIS — Z86711 Personal history of pulmonary embolism: Secondary | ICD-10-CM

## 2023-01-06 DIAGNOSIS — Z79899 Other long term (current) drug therapy: Secondary | ICD-10-CM

## 2023-01-06 DIAGNOSIS — I5032 Chronic diastolic (congestive) heart failure: Secondary | ICD-10-CM | POA: Diagnosis present

## 2023-01-06 DIAGNOSIS — I6523 Occlusion and stenosis of bilateral carotid arteries: Secondary | ICD-10-CM | POA: Diagnosis not present

## 2023-01-06 DIAGNOSIS — E119 Type 2 diabetes mellitus without complications: Secondary | ICD-10-CM

## 2023-01-06 DIAGNOSIS — Z794 Long term (current) use of insulin: Secondary | ICD-10-CM

## 2023-01-06 DIAGNOSIS — I6503 Occlusion and stenosis of bilateral vertebral arteries: Secondary | ICD-10-CM | POA: Diagnosis not present

## 2023-01-06 LAB — I-STAT CHEM 8, ED
BUN: 30 mg/dL — ABNORMAL HIGH (ref 8–23)
Calcium, Ion: 1.14 mmol/L — ABNORMAL LOW (ref 1.15–1.40)
Chloride: 101 mmol/L (ref 98–111)
Creatinine, Ser: 1.2 mg/dL (ref 0.61–1.24)
Glucose, Bld: 144 mg/dL — ABNORMAL HIGH (ref 70–99)
HCT: 48 % (ref 39.0–52.0)
Hemoglobin: 16.3 g/dL (ref 13.0–17.0)
Potassium: 4.2 mmol/L (ref 3.5–5.1)
Sodium: 141 mmol/L (ref 135–145)
TCO2: 29 mmol/L (ref 22–32)

## 2023-01-06 LAB — COMPREHENSIVE METABOLIC PANEL
ALT: 27 U/L (ref 0–44)
AST: 24 U/L (ref 15–41)
Albumin: 4.1 g/dL (ref 3.5–5.0)
Alkaline Phosphatase: 66 U/L (ref 38–126)
Anion gap: 10 (ref 5–15)
BUN: 25 mg/dL — ABNORMAL HIGH (ref 8–23)
CO2: 27 mmol/L (ref 22–32)
Calcium: 9.7 mg/dL (ref 8.9–10.3)
Chloride: 102 mmol/L (ref 98–111)
Creatinine, Ser: 1.28 mg/dL — ABNORMAL HIGH (ref 0.61–1.24)
GFR, Estimated: 56 mL/min — ABNORMAL LOW (ref >60)
Glucose, Bld: 146 mg/dL — ABNORMAL HIGH (ref 70–99)
Potassium: 4.1 mmol/L (ref 3.5–5.1)
Sodium: 139 mmol/L (ref 135–145)
Total Bilirubin: 1.1 mg/dL (ref <1.2)
Total Protein: 8.4 g/dL — ABNORMAL HIGH (ref 6.5–8.1)

## 2023-01-06 LAB — CBC
HCT: 46.6 % (ref 39.0–52.0)
Hemoglobin: 15.4 g/dL (ref 13.0–17.0)
MCH: 29.7 pg (ref 26.0–34.0)
MCHC: 33 g/dL (ref 30.0–36.0)
MCV: 90 fL (ref 80.0–100.0)
Platelets: 228 10*3/uL (ref 150–400)
RBC: 5.18 MIL/uL (ref 4.22–5.81)
RDW: 13.7 % (ref 11.5–15.5)
WBC: 10.3 10*3/uL (ref 4.0–10.5)
nRBC: 0 % (ref 0.0–0.2)

## 2023-01-06 LAB — URINALYSIS, ROUTINE W REFLEX MICROSCOPIC
Bacteria, UA: NONE SEEN
Bilirubin Urine: NEGATIVE
Glucose, UA: >=500 mg/dL — AB
Hgb urine dipstick: NEGATIVE
Ketones, ur: 5 mg/dL — AB
Leukocytes,Ua: NEGATIVE
Nitrite: NEGATIVE
Protein, ur: NEGATIVE mg/dL
Specific Gravity, Urine: 1.04 — ABNORMAL HIGH (ref 1.005–1.030)
pH: 8 (ref 5.0–8.0)

## 2023-01-06 LAB — DIFFERENTIAL
Abs Immature Granulocytes: 0.03 10*3/uL (ref 0.00–0.07)
Basophils Absolute: 0.1 10*3/uL (ref 0.0–0.1)
Basophils Relative: 1 %
Eosinophils Absolute: 0.1 10*3/uL (ref 0.0–0.5)
Eosinophils Relative: 1 %
Immature Granulocytes: 0 %
Lymphocytes Relative: 27 %
Lymphs Abs: 2.7 10*3/uL (ref 0.7–4.0)
Monocytes Absolute: 1.4 10*3/uL — ABNORMAL HIGH (ref 0.1–1.0)
Monocytes Relative: 13 %
Neutro Abs: 5.9 10*3/uL (ref 1.7–7.7)
Neutrophils Relative %: 58 %

## 2023-01-06 LAB — RAPID URINE DRUG SCREEN, HOSP PERFORMED
Amphetamines: NOT DETECTED
Barbiturates: NOT DETECTED
Benzodiazepines: NOT DETECTED
Cocaine: NOT DETECTED
Opiates: NOT DETECTED
Tetrahydrocannabinol: NOT DETECTED

## 2023-01-06 LAB — LIPID PANEL
Cholesterol: 146 mg/dL (ref 0–200)
HDL: 46 mg/dL (ref >40)
LDL Cholesterol: 82 mg/dL (ref 0–99)
Total CHOL/HDL Ratio: 3.2 {ratio}
Triglycerides: 92 mg/dL (ref <150)
VLDL: 18 mg/dL (ref 0–40)

## 2023-01-06 LAB — PROTIME-INR
INR: 1 (ref 0.8–1.2)
Prothrombin Time: 13.9 s (ref 11.4–15.2)

## 2023-01-06 LAB — ETHANOL: Alcohol, Ethyl (B): <10 mg/dL (ref <10)

## 2023-01-06 LAB — HEMOGLOBIN A1C
Hgb A1c MFr Bld: 8.1 % — ABNORMAL HIGH (ref 4.8–5.6)
Mean Plasma Glucose: 185.77 mg/dL

## 2023-01-06 LAB — GLUCOSE, CAPILLARY
Glucose-Capillary: 329 mg/dL — ABNORMAL HIGH (ref 70–99)
Glucose-Capillary: 296 mg/dL — ABNORMAL HIGH (ref 70–99)

## 2023-01-06 LAB — APTT: aPTT: 25 s (ref 24–36)

## 2023-01-06 LAB — MRSA NEXT GEN BY PCR, NASAL: MRSA by PCR Next Gen: NOT DETECTED

## 2023-01-06 MED ORDER — ACETAMINOPHEN 650 MG RE SUPP: 650.0000 mg | Freq: Four times a day (QID) | RECTAL | Status: DC | PRN

## 2023-01-06 MED ORDER — PANTOPRAZOLE SODIUM 40 MG PO TBEC
40.0000 mg | DELAYED_RELEASE_TABLET | Freq: Two times a day (BID) | ORAL | Status: DC
  Administered 2023-01-06 – 2023-01-08 (×5): 40 mg via ORAL
  Filled 2023-01-06 (×5): qty 1

## 2023-01-06 MED ORDER — INSULIN ASPART 100 UNIT/ML IJ SOLN
0.0000 [IU] | Freq: Three times a day (TID) | INTRAMUSCULAR | Status: DC
  Administered 2023-01-06: 7 [IU] via SUBCUTANEOUS
  Administered 2023-01-07 (×2): 2 [IU] via SUBCUTANEOUS
  Administered 2023-01-07: 5 [IU] via SUBCUTANEOUS
  Administered 2023-01-08: 2 [IU] via SUBCUTANEOUS
  Administered 2023-01-08: 3 [IU] via SUBCUTANEOUS

## 2023-01-06 MED ORDER — ACETAMINOPHEN 325 MG PO TABS: 650.0000 mg | ORAL_TABLET | Freq: Four times a day (QID) | ORAL | Status: DC | PRN

## 2023-01-06 MED ORDER — ASPIRIN 81 MG PO TBEC
81.0000 mg | DELAYED_RELEASE_TABLET | Freq: Every day | ORAL | Status: DC
  Administered 2023-01-06 – 2023-01-08 (×3): 81 mg via ORAL
  Filled 2023-01-06 (×3): qty 1

## 2023-01-06 MED ORDER — TAMSULOSIN HCL 0.4 MG PO CAPS
0.4000 mg | ORAL_CAPSULE | Freq: Every day | ORAL | Status: DC
  Administered 2023-01-06 – 2023-01-07 (×2): 0.4 mg via ORAL
  Filled 2023-01-06 (×2): qty 1

## 2023-01-06 MED ORDER — ATORVASTATIN CALCIUM 40 MG PO TABS
40.0000 mg | ORAL_TABLET | Freq: Every day | ORAL | Status: DC
  Administered 2023-01-06 – 2023-01-07 (×2): 40 mg via ORAL
  Filled 2023-01-06 (×2): qty 1

## 2023-01-06 MED ORDER — TRAMADOL HCL 50 MG PO TABS: 50.0000 mg | ORAL_TABLET | Freq: Two times a day (BID) | ORAL | Status: DC | PRN

## 2023-01-06 MED ORDER — DIPHENHYDRAMINE HCL 50 MG/ML IJ SOLN
50.0000 mg | Freq: Once | INTRAMUSCULAR | Status: AC
  Administered 2023-01-06: 50 mg via INTRAVENOUS

## 2023-01-06 MED ORDER — METHYLPREDNISOLONE SODIUM SUCC 125 MG IJ SOLR
125.0000 mg | Freq: Once | INTRAMUSCULAR | Status: AC
  Administered 2023-01-06: 250 mg via INTRAVENOUS

## 2023-01-06 MED ORDER — RIVAROXABAN 10 MG PO TABS
10.0000 mg | ORAL_TABLET | Freq: Every day | ORAL | Status: DC
  Administered 2023-01-06 – 2023-01-08 (×3): 10 mg via ORAL
  Filled 2023-01-06 (×3): qty 1

## 2023-01-06 MED ORDER — CLOPIDOGREL BISULFATE 75 MG PO TABS
75.0000 mg | ORAL_TABLET | Freq: Every day | ORAL | Status: DC
  Administered 2023-01-06 – 2023-01-08 (×3): 75 mg via ORAL
  Filled 2023-01-06 (×3): qty 1

## 2023-01-06 MED ORDER — IOHEXOL 350 MG/ML SOLN
75.0000 mL | Freq: Once | INTRAVENOUS | Status: AC | PRN
  Administered 2023-01-06: 75 mL via INTRAVENOUS

## 2023-01-06 MED ORDER — LACTATED RINGERS IV BOLUS
1000.0000 mL | Freq: Once | INTRAVENOUS | Status: AC
  Administered 2023-01-06: 1000 mL via INTRAVENOUS

## 2023-01-06 MED ORDER — POLYETHYLENE GLYCOL 3350 17 G PO PACK
17.0000 g | PACK | Freq: Every day | ORAL | Status: DC
  Administered 2023-01-06 – 2023-01-08 (×3): 17 g via ORAL
  Filled 2023-01-06 (×3): qty 1

## 2023-01-06 MED ORDER — POLYETHYLENE GLYCOL 3350 17 G PO PACK: 17.0000 g | PACK | Freq: Every day | ORAL | Status: DC | PRN

## 2023-01-06 NOTE — ED Notes (Signed)
Pt passed swallow screen

## 2023-01-06 NOTE — Progress Notes (Signed)
Spoke with pt's wife [name], updated on POC.  Per wife, pt's hearing aids are missing.

## 2023-01-06 NOTE — Code Documentation (Signed)
Stroke Response Nurse Documentation Code Documentation  Ricardo Le is a 82 y.o. male arriving to [facility]  via [facility] on 01-06-2023 with past medical hx of IVH, HTN, DM, CAD. On aspirin 81 mg daily and clopidogrel 75 mg daily. Code stroke was activated by ED.   Patient from home where he was LKW at bedtime last night and now complaining of Left side numbness.  He states that he awoke sometime during the night and noted that he had some numbness on the left, alerted his wife this morning and EMS was called.  Stroke team at the bedside on patient arrival. Labs drawn and patient cleared for CT by Dr. Durwin Nora. Patient to CT with team. NIHSS 3, see documentation for details and code stroke times. Patient with disoriented, left leg weakness, and left decreased sensation on exam. The following imaging was completed:  CT Head, CTA, and MRI. Patient is not a candidate for IV Thrombolytic due to hx of IVH and Last known well outside TNK window. Patient is not a candidate for IR due to no LVO on CTA.   Care Plan: VS and NIHSS q 2 hours x 12 hours.  Admit for stroke work up.   Bedside handoff with ED RN Nat Math, [employer]  Stroke Response RN

## 2023-01-06 NOTE — Hospital Course (Addendum)
Proably a pontine stroke. No hisotry of stroke. Left arm numbness and weak with diminshed sensation CT negative MRI pend  Walking has gotten worse ans that he has had a fall in past about 3 months ago and head brain bleed  Patient might have alzheimer.  December the 3rd, [facility] neurology to see.  Patient states that someone else lives there and that there are thing that are in the house when there is nothing there, Concerned about losing memory.    ------------------------------------------------------------------------- 11/24 Last night, for an hour, something terrible happpened. Went  home.  Reports feels good.  Reports L arm weakness  Alert [place] self, DOB, location, City, event. Year.   Strength intact.

## 2023-01-06 NOTE — Plan of Care (Signed)
  Problem: Coping: Goal: Ability to adjust to condition or change in health will improve Outcome: Progressing   Problem: Fluid Volume: Goal: Ability to maintain a balanced intake and output will improve Outcome: Progressing   Problem: Metabolic: Goal: Ability to maintain appropriate glucose levels will improve Outcome: Progressing   Problem: Nutritional: Goal: Maintenance of adequate nutrition will improve Outcome: Progressing   Problem: Skin Integrity: Goal: Risk for impaired skin integrity will decrease Outcome: Progressing   Problem: Tissue Perfusion: Goal: Adequacy of tissue perfusion will improve Outcome: Progressing   Problem: Education: Goal: Knowledge of General Education information will improve Description: Including pain rating scale, medication(s)/side effects and non-pharmacologic comfort measures Outcome: Progressing   Problem: Health Behavior/Discharge Planning: Goal: Ability to manage health-related needs will improve Outcome: Progressing   Problem: Clinical Measurements: Goal: Ability to maintain clinical measurements within normal limits will improve Outcome: Progressing Goal: Will remain free from infection Outcome: Progressing Goal: Diagnostic test results will improve Outcome: Progressing Goal: Respiratory complications will improve Outcome: Progressing Goal: Cardiovascular complication will be avoided Outcome: Progressing   Problem: Activity: Goal: Risk for activity intolerance will decrease Outcome: Progressing   Problem: Nutrition: Goal: Adequate nutrition will be maintained Outcome: Progressing   Problem: Coping: Goal: Level of anxiety will decrease Outcome: Progressing   Problem: Elimination: Goal: Will not experience complications related to bowel motility Outcome: Progressing Goal: Will not experience complications related to urinary retention Outcome: Progressing   Problem: Pain Management: Goal: General experience of  comfort will improve Outcome: Progressing   Problem: Safety: Goal: Ability to remain free from injury will improve Outcome: Progressing   Problem: Skin Integrity: Goal: Risk for impaired skin integrity will decrease Outcome: Progressing

## 2023-01-06 NOTE — Consult Note (Signed)
NEUROLOGY CONSULT NOTE   Date of service: January 06, 2023 Patient Name: Ricardo Le MRN:  [id number] DOB:  [birth year] Chief Complaint: "CODE STROKE" Requesting Provider: Gloris Manchester, MD  History of Present Illness  Ricardo Le is a 82 y.o. male with PMH significant for IVH, falls, HTN, DM, CAD, PAD, hard of hearing who presented to the ED this am d/t left arm numbness that started sometime in the middle of night. Patient has a history of falls, family states that patient is not always honest about falling at home. Wife called EMS due to his complaints and she was afraid that he had fallen during the night and wasn't telling her.  On exam in CT, patient has inconsistent visual field findings (unsure if this is from confusion or from visual deficits), decreased sensation in left face, arm and leg, slight left leg weakness. Due to the inconsistent visual findings, a CT Angio is warranted. Patient has allergy to contrast noted, was given premedication. CT and CTA negative.   LKW: 2200 11/22 Modified rankin score: 1-2 IV Thrombolysis: No (hx of IVH) EVT: No (No LVO) NIHSS: 3   NIHSS:  1a Level of Conscious.: 0 1b LOC Questions: 0 1c LOC Commands: 0 2 Best Gaze: 0 3 Visual: 0 4 Facial Palsy: 0 5a Motor Arm - left: 0 5b Motor Arm - Right:0  6a Motor Leg - Left: 1 6b Motor Leg - Right: 0 7 Limb Ataxia: 0 8 Sensory: 2 9 Best Language: 0 10 Dysarthria: 0 11 Extinct. and Inatten.: 0 TOTAL: 3   ROS   Unable to ascertain due to AMS  Past History   Past Medical History:  Diagnosis Date   CAD (coronary artery disease)    a. 04/2007 s/p CABG x 4  - LIMA->LAD, VG->OM1->Om2, VG->PDA;  b. 12/2008 & 06/2010 Caths - Native multivessel dzs with 4/4 patent grafts. c. cath 03/09/2015 patent grafts, unchanged from prior cath.   Cancer [facility])    pancreatic  March 2011   Carotid bruit    a. 07/2010 U/S- 0-39% bilat ICA stenosis   Chest pain    Noncardiac probably related to reflux    DDD (degenerative disc disease)    several surgeries   Fracture acetabulum-closed (HCC) 07/08/2018   GERD (gastroesophageal reflux disease)    HNP (herniated nucleus pulposus), lumbar    a. L2-3, s/p laminotomy, microdiskectomy 02/2009   HOH (hard of hearing)    uses amplifiers    Nephrolithiasis    PAD (peripheral artery disease) (HCC) 04/2014   s/p L Common Iliac Stent   Palpitations    Pancreatic mass    a. 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   Pneumonia    hx of  pna   Pure hypercholesterolemia    statin intolerance   Type II or unspecified type diabetes mellitus without mention of complication, not stated as uncontrolled    insulin dependent   Unspecified essential hypertension     Past Surgical History:  Procedure Laterality Date   ABDOMINAL AORTAGRAM N/A 05/13/2014   Procedure: ABDOMINAL Ronny Flurry;  Surgeon: Iran Ouch, MD;  Location: [facility] CATH LAB;  Service: Cardiovascular;  Laterality: N/A;   ANTERIOR CERVICAL DECOMP/DISCECTOMY FUSION N/A 07/28/2014   Procedure: ANTERIOR CERVICAL DECOMPRESSION/DISCECTOMY FUSION CERVICAL FOUR-FIVE CERVICAL FIVE-SIX ;  Surgeon: Julio Sicks, MD;  Location: [facility] NEURO ORS;  Service: Neurosurgery;  Laterality: N/A;   APPENDECTOMY     arthroscopy right knee     BACK SURGERY  CARDIAC CATHETERIZATION N/A 03/09/2015   Procedure: Left Heart Cath and Cors/Grafts Angiography;  Surgeon: Lennette Bihari, MD;  Location: [facility] INVASIVE CV LAB;  Service: Cardiovascular;  Laterality: N/A;   CHOLECYSTECTOMY     CORONARY ARTERY BYPASS GRAFT     X 4 2009 (LIMA to LAD, SVG to first cicumflex marginal branch with sequential SVG to second cicumflex marginal branch  and saphenous vein to posterior descending coronary artery with endoscopic,vein harvest rt. lower exttremity by Dr.Owen on March 31,2009   CORONARY/GRAFT ACUTE MI REVASCULARIZATION N/A 11/06/2020   Procedure: Coronary/Graft Acute MI Revascularization;  Surgeon: Swaziland, Peter M, MD;   Location: [facility] INVASIVE CV LAB;  Service: Cardiovascular;  Laterality: N/A;   CYST REMOVED     FROM SPINE   CYSTOURETHROSCOPY     right retrograde pyelogram,manipulate stone in the renal pelvis, rt. double-j catheter.   EYE SURGERY     KNEE SURGERY     rt knee   LEFT HEART CATH AND CORONARY ANGIOGRAPHY N/A 11/06/2020   Procedure: LEFT HEART CATH AND CORONARY ANGIOGRAPHY;  Surgeon: Swaziland, Peter M, MD;  Location: [facility] INVASIVE CV LAB;  Service: Cardiovascular;  Laterality: N/A;   LEFT HEART CATH AND CORS/GRAFTS ANGIOGRAPHY N/A 02/20/2017   Procedure: LEFT HEART CATH AND CORS/GRAFTS ANGIOGRAPHY;  Surgeon: Marykay Lex, MD;  Location: [facility] INVASIVE CV LAB;  Service: Cardiovascular;  Laterality: N/A;   LEFT HEART CATH AND CORS/GRAFTS ANGIOGRAPHY N/A 05/28/2018   Procedure: LEFT HEART CATH AND CORS/GRAFTS ANGIOGRAPHY;  Surgeon: Kathleene Hazel, MD;  Location: [facility] INVASIVE CV LAB;  Service: Cardiovascular;  Laterality: N/A;   NEPHROLITHOTOMY     PERCUTANEOUS   PARS PLANA VITRECTOMY     lt. eye,retinal photocoagulation lt. eye, membrane peel lt. eye.   PERCUTANEOUS STENT INTERVENTION Left 05/13/2014   Procedure: PERCUTANEOUS STENT INTERVENTION;  Surgeon: Iran Ouch, MD;  Location: [facility] CATH LAB;  Service: Cardiovascular;  Laterality: Left;  COMMON ILLIAC   re-exploratin of laminectomy  05/2008   (RT L2-3) WITH REDO MICRODISKECTOMY    Family History: Family History  Problem Relation Age of Onset   Heart attack Father        died of MI @ 24   Diabetes Brother        type 2   Other Mother        died @ 28   Coronary artery disease Sister        alive   Other Other        no fh of colon cancer   Coronary artery disease Brother        s/p CABG.  Alive @ 15    Social History  reports that he has never smoked. He has never used smokeless tobacco. He reports that he does not drink alcohol and does not use drugs.  Allergies  Allergen Reactions   Dicyclomine Anaphylaxis   Ioversol     Oxycodone Hcl Nausea And Vomiting   Ranexa [Ranolazine] Other (See Comments)    Caused weakness (possibly)   Rosuvastatin Calcium Other (See Comments)    Muscle pain   Rosuvastatin Calcium Other (See Comments)    Myalgias Muscle pain   Tramadol Other (See Comments)    Makes patient "spaced out and dizzy"   Iodinated Contrast Media Other (See Comments) and Rash   Iohexol Hives and Itching    Patient is ok with premedication (Benadryl 50 mg by mouth); Onset Date: 12/03/2007  Patient is ok with premedication (Benadryl 50 mg  by mouth); Onset Date: 12/03/2007   Statins Other (See Comments)    Muscle pain, but can tolerate Atorvastatin     Medications   Current Facility-Administered Medications:    diphenhydrAMINE (BENADRYL) injection 50 mg, 50 mg, Intravenous, Once, Richardo Priest, Erin C, NP   methylPREDNISolone sodium succinate (SOLU-MEDROL) 125 mg/2 mL injection 125 mg, 125 mg, Intravenous, Once, Hetty Blend C, NP  Current Outpatient Medications:    acetaminophen (TYLENOL) 325 MG tablet, Take 650 mg by mouth every 6 (six) hours as needed (for pain). , Disp: , Rfl:    albuterol (VENTOLIN HFA) 108 (90 Base) MCG/ACT inhaler, Inhale 2 puffs into the lungs every 6 (six) hours as needed for wheezing or shortness of breath., Disp: , Rfl:    aspirin EC 81 MG tablet, Take 81 mg by mouth daily., Disp: , Rfl:    atorvastatin (LIPITOR) 40 MG tablet, Take 1 tablet (40 mg total) by mouth daily., Disp: 90 tablet, Rfl: 1   BD PEN NEEDLE NANO 2ND GEN 32G X 4 MM MISC, 1 each by Other route daily., Disp: , Rfl:    bisacodyl (DULCOLAX) 5 MG EC tablet, Take 5 mg by mouth every 3 (three) days. , Disp: , Rfl:    Blood Glucose Monitoring Suppl (ONETOUCH VERIO FLEX SYSTEM) w/Device KIT, 1 each by Other route as directed., Disp: , Rfl:    clopidogrel (PLAVIX) 75 MG tablet, Take 1 tablet (75 mg total) by mouth daily., Disp: 90 tablet, Rfl: 3   glimepiride (AMARYL) 2 MG tablet, Take 4 mg by mouth daily., Disp: ,  Rfl:    Insulin Glargine (LANTUS) 100 UNIT/ML Solostar Pen, Inject 18 Units into the skin at bedtime. (Patient taking differently: Inject 14 Units into the skin at bedtime.), Disp: 15 mL, Rfl: 11   lisinopril (ZESTRIL) 20 MG tablet, Take 1 tablet (20 mg total) by mouth daily., Disp: 90 tablet, Rfl: 3   Melatonin 3 MG TABS, Take 1 tablet (3 mg total) by mouth at bedtime., Disp: 30 tablet, Rfl: 0   metoprolol succinate (TOPROL-XL) 25 MG 24 hr tablet, Take 1 tablet (25 mg total) by mouth daily., Disp: 90 tablet, Rfl: 1   Multiple Vitamin (MULTIVITAMIN WITH MINERALS) TABS tablet, Take 1 tablet by mouth daily., Disp: , Rfl:    nitroGLYCERIN (NITROSTAT) 0.4 MG SL tablet, DISSOLVE 1 TABLET UNDER THE TONGUE EVERY 5 MINUTES AS NEEDED FOR CHEST PAIN, Disp: 25 tablet, Rfl: 11   ONETOUCH VERIO test strip, 1 each by Other route 2 (two) times daily., Disp: , Rfl:    pantoprazole (PROTONIX) 40 MG tablet, Take 40 mg by mouth 2 (two) times daily., Disp: , Rfl:    tamsulosin (FLOMAX) 0.4 MG CAPS capsule, Take 1 capsule (0.4 mg total) by mouth daily after supper., Disp: 30 capsule, Rfl: 0   traMADol (ULTRAM) 50 MG tablet, Take 50 mg by mouth 2 (two) times daily as needed for moderate pain., Disp: , Rfl:   Vitals   Vitals:   01/06/23 0810 01/06/23 0814  BP:  (!) 156/69  Pulse:  (!) 59  Resp:  (!) 40  SpO2:  97%  Weight: 79.4 kg   Height: 5\' 5"  (1.651 m)     Body mass index is 29.12 kg/m.  Physical Exam   Constitutional: Appears well-developed and well-nourished.  Psych: Affect appropriate to situation.  HENT: [place]/AT Cardiovascular: Normal rate and regular rhythm.  Respiratory: Effort normal, non-labored breathing.   Neurologic Examination   Neuro: Mental Status: Patient is awake,  alert, oriented to person, place, month, year, and situation. Patient is able to give a clear and coherent history. No signs of aphasia, dysarthria or neglect Cranial Nerves: II: Inconsistent responses when testing  visual fields.  III,IV, VI: EOMI without ptosis or diploplia.  V: Facial sensation is decreased on left VII: Facial movement is symmetric.  VIII: hearing is intact to voice X: No hoarseness XI: Shoulder shrug is symmetric. XII: tongue is midline without atrophy or fasciculations.  Motor: Tone is normal. Bulk is normal.   Slight drift seen in LLE, 4+/5  Sensory: Sensation is decreased to light touch in left arm and leg. Cerebellar: FNF intact bilaterally   Labs/Imaging/Neurodiagnostic studies   CBC:  Recent Labs  Lab 03-Feb-2023 0840 2023-02-03 0848  WBC 10.3  --   NEUTROABS 5.9  --   HGB 15.4 16.3  HCT 46.6 48.0  MCV 90.0  --   PLT 228  --    Basic Metabolic Panel:  Lab Results  Component Value Date   NA 141 02/03/2023   K 4.2 Feb 03, 2023   CO2 20 11/25/2020   GLUCOSE 144 (H) 2023-02-03   BUN 30 (H) 03-Feb-2023   CREATININE 1.20 03-Feb-2023   CALCIUM 9.8 11/25/2020   GFRNONAA >60 11/10/2020   GFRAA 96 09/08/2019   Lipid Panel:  Lab Results  Component Value Date   LDLCALC 89 11/06/2020   HgbA1c:  Lab Results  Component Value Date   HGBA1C 7.4 (H) 11/06/2020   Urine Drug Screen: No results found for: "LABOPIA", "COCAINSCRNUR", "LABBENZ", "AMPHETMU", "THCU", "LABBARB"  Alcohol Level No results found for: "ETH" INR  Lab Results  Component Value Date   INR 1.0 11/06/2020   APTT  Lab Results  Component Value Date   APTT 24 11/06/2020   AED levels: No results found for: "PHENYTOIN", "ZONISAMIDE", "LAMOTRIGINE", "LEVETIRACETA"  CT Head without contrast(Personally reviewed): No acute cortically based infarct or acute intracranial hemorrhage identified.  ASPECTS 10. Small chronic posterior right MCA territory infarct  Advanced chronic bilateral white matter disease.  CT angio Head and Neck with contrast(Personally reviewed): Negative for emergent large vessel occlusion Critical stenoses BOTH Vertebral Arteries (proximal right vertebral, and distal left  vertebral which is sole supply to the Basilar).   ASSESSMENT   Ricardo Le is a 82 y.o. male with PMH significant for IVH, falls, HTN, DM, CAD, PAD, hard of hearing who presented to the ED this am d/t left arm numbness that started sometime in the middle of night. CT and CTA negative. Numbness noted in left face, arm and leg.  Due to continued sensory deficits and critical stenosis seen, would recommend a  MRI of the brain and admission for stroke work up   RECOMMENDATIONS  - Frequent Neuro checks per stroke unit protocol - MRI Brain stroke protocol - Vascular imaging - CTA completed - TTE - Lipid panel - Statin - will be started if LDL>70 or otherwise medically indicated - A1C - Antithrombotic - ASA/Plavix for 3 months due to intracranial stenosis  - DVT ppx - lovenox - Smoking cessation - will counsel patient - SBP goal - <220, PRN labetalol if HR>60 and PRN Hydralazine if HR<60 - Telemetry monitoring for arrhythmia - 72h - Swallow screen - will be performed prior to PO intake - Stroke education - will be given - PT/OT/SLP - Dispo: admit for stroke work up  _______________________________________________________________   Pt seen by Neuro NP/APP and later by MD. Note/plan to be edited by MD as needed.  Ricardo January, DNP, AGACNP-BC [facility] Please use AMION for contact information & EPIC for messaging.  NEUROHOSPITALIST ADDENDUM Performed a face to face diagnostic evaluation.   I have reviewed the contents of history and physical exam as documented by PA/ARNP/Resident and agree with above documentation.  I have discussed and formulated the above plan as documented. Edits to the note have been made as needed.  Impression:  Key exam findings: Plan:  Ricardo Blinks, MD [facility] [phone]   If 7pm to 7am, please call on call as listed on AMION.  NEUROHOSPITALIST ADDENDUM Performed a face to face diagnostic evaluation.   I  have reviewed the contents of history and physical exam as documented by PA/ARNP/Resident and agree with above documentation.  I have discussed and formulated the above plan as documented. Edits to the note have been made as needed.  Impression/Key exam findings/Plan: left sided numbness is concerning for a potential small stroke. When examining him, noted to have inconsistent responses to visual field testing BL which I suspect is likely due to encephalopathy rather than a true visual field cut since. Still went ahead and got CTA which would not explain his presentation.  Suspect small stroke. Will therefore, recommend medicine admission with stroke workup.  Ricardo Blinks, MD [facility] [phone]   If 7pm to 7am, please call on call as listed on AMION.

## 2023-01-06 NOTE — Progress Notes (Signed)
Pt to MRI

## 2023-01-06 NOTE — H&P (Signed)
Date: 01/06/2023               Patient Name:  Ricardo Le MRN: [id number]  DOB: Sep 26, 1940 Age / Sex: 82 y.o., male   PCP: Noni Saupe, MD         Medical Service: Internal Medicine Teaching Service         Attending Physician: Dr. Johny Sax    First Contact: Dr. Christie Nottingham Pager: [phone]  Second Contact: Dr. Modena Slater Pager: [phone]       After Hours (After 5p/  First Contact Pager: [phone]  weekends / holidays): Second Contact Pager: [phone]   Chief Complaint: Acute Cerebrovascular Accident  History of Present Illness: Ricardo Le is an 82 year old man with pertinent PMH of T2DM, HTN, HLD, CAD, VTE, GERD.  Last night noticed mild numbness to his left arm.  He went to bed at around 10 PM.  When he woke up this morning, numbness was worse. He feels numbness to the left side of his face.  He has left-sided weakness. He denies any other current symptoms. LKW unknown per pt, wife, and son.  MRI pending.  Patient is alert and oriented to self, location, month, event, not year. Patient reports yesterday evening, he noticed left arm numbness, felt like his arm was asleeped, when he went to sleep last night, woke up this morning with left arm weakness and left leg weakness. LKN yesterday evening, roughly around 6 PM. Reports on/off abdominal pain for 3 years; no diarrhea. Last bowel movement was 2-3 days ago, feels a bit constipated. Denies any chest pain, palpitations, LE edema bilaterally, falls, dizziness or lightheadedness, headaches, vision changes. Reports baseline shortness of breath from torn diaphragm.   Collateral: Elisabeth Sowles (son) 12:50 History of head injury in 2021 - patient returned to baseline, but has gradually worsening cognition and motor condition over the last year, slowing motor movements. Patient stays cold at baseline. Recommends speaking with the patient's wife: [phone].   Moath Lum (wife) 1505: Pt's walking is worse,  believes he is losing his memory. Believes that he is seeing people who are not present.   Meds:  Current Outpatient Medications  Medication Instructions   acetaminophen (TYLENOL) 650 mg, Oral, Every 6 hours PRN   albuterol (VENTOLIN HFA) 108 (90 Base) MCG/ACT inhaler 2 puffs, Inhalation, Every 6 hours PRN   aspirin EC 81 mg, Oral, Daily   atorvastatin (LIPITOR) 40 mg, Oral, Daily   BD PEN NEEDLE NANO 2ND GEN 32G X 4 MM MISC 1 each, Other, Daily   bisacodyl (DULCOLAX) 5 mg, Oral, every 72 hours   Blood Glucose Monitoring Suppl (ONETOUCH VERIO FLEX SYSTEM) w/Device KIT 1 each, Other, As directed   clopidogrel (PLAVIX) 75 mg, Oral, Daily   glimepiride (AMARYL) 4 mg, Oral, Daily   insulin glargine (LANTUS) 18 Units, Subcutaneous, Daily at bedtime   lisinopril (ZESTRIL) 20 mg, Oral, Daily   melatonin 3 mg, Oral, Daily at bedtime   metoprolol succinate (TOPROL-XL) 25 mg, Oral, Daily   Multiple Vitamin (MULTIVITAMIN WITH MINERALS) TABS tablet 1 tablet, Oral, Daily   nitroGLYCERIN (NITROSTAT) 0.4 MG SL tablet DISSOLVE 1 TABLET UNDER THE TONGUE EVERY 5 MINUTES AS NEEDED FOR CHEST PAIN   ONETOUCH VERIO test strip 1 each, Other, 2 times daily   pantoprazole (PROTONIX) 40 mg, Oral, 2 times daily   tamsulosin (FLOMAX) 0.4 mg, Oral, Daily after supper   traMADol (ULTRAM) 50 mg, Oral, 2 times daily PRN  Allergies: Allergies as of 01/06/2023 - Review Complete 09/13/2022  Allergen Reaction Noted   Dicyclomine Anaphylaxis 03/30/2020   Ioversol  04/06/2020   Oxycodone hcl Nausea And Vomiting 07/20/2018   Ranexa [ranolazine] Other (See Comments) 10/18/2018   Rosuvastatin calcium Other (See Comments) 01/26/2015   Rosuvastatin calcium Other (See Comments) 06/09/2014   Tramadol Other (See Comments) 07/20/2018   Iodinated contrast media Other (See Comments) and Rash 11/21/2019   Iohexol Hives and Itching 12/03/2007   Statins Other (See Comments) 05/05/2011   Past Medical History:  Diagnosis Date    CAD (coronary artery disease)    a. 04/2007 s/p CABG x 4  - LIMA->LAD, VG->OM1->Om2, VG->PDA;  b. 12/2008 & 06/2010 Caths - Native multivessel dzs with 4/4 patent grafts. c. cath 03/09/2015 patent grafts, unchanged from prior cath.   Cancer [facility])    pancreatic  March 2011   Carotid bruit    a. 07/2010 U/S- 0-39% bilat ICA stenosis   Chest pain    Noncardiac probably related to reflux   DDD (degenerative disc disease)    several surgeries   Fracture acetabulum-closed (HCC) 07/08/2018   GERD (gastroesophageal reflux disease)    HNP (herniated nucleus pulposus), lumbar    a. L2-3, s/p laminotomy, microdiskectomy 02/2009   HOH (hard of hearing)    uses amplifiers    Nephrolithiasis    PAD (peripheral artery disease) (HCC) 04/2014   s/p L Common Iliac Stent   Palpitations    Pancreatic mass    a. 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   Pneumonia    hx of  pna   Pure hypercholesterolemia    statin intolerance   Type II or unspecified type diabetes mellitus without mention of complication, not stated as uncontrolled    insulin dependent   Unspecified essential hypertension     Family History: family history includes Coronary artery disease in his brother and sister; Diabetes in his brother; Heart attack in his father; Other in his mother and another family member.   Social History:  Lives with his wife and adult daughter Retired, was a Engineering geologist, [employer] Does not smoke cigarettes, drinks alcohol, or illicit drugs  PCP: does not know  Review of Systems: A complete ROS was negative except as per HPI.   Physical Exam: Blood pressure (!) 160/74, pulse 66, resp. rate (!) 27, height 5\' 5"  (1.651 m), weight 79.4 kg, SpO2 99%.  General: Patient is resting comfortably in bed in no acute distress  Eyes: Pupils equal and reactive to light Head: Normocephalic, atraumatic  Neck: Supple, nontender, full range of motion, No JVD Cardio: Regular rate and  rhythm, no murmurs, rubs or gallops. 2+ pulses to bilateral upper and lower extremities  Chest: No chest tenderness Pulmonary: Clear to ausculation bilaterally with no rales, rhonchi, and crackles  Abdomen: Soft, nontender with normoactive bowel sounds with no rebound or guarding  Back: No midline tenderness, no step off or deformities noted. No paraspinal muscle tenderness.  Skin: No rashes noted   Neuro: Orientation: Person, Place, month. Facial: symmetric at rest/ minimal right sided droop on smile, facial sensation intact Tongue: midline, no fasciculations Eye exam: pupils symmetric, PERRL, +horizontal Nystagmus. Tracking across midline impaired   Visual fields oppositional testing: RUQ - grossly intact (correctly identifies number of fingers) RLQ - grossly intact (correctly identifies number of fingers) LUQ - grossly intact (correctly identifies number of fingers) LLQ - grossly intact (correctly identifies number of fingers)  Extremities: RUE -  sensation intact, 3 sites. 5/5 strength, reflexes grossly intact. LUE - sensation mildly altered at all 3 sites. 4/5 strength, reflexes grossly intact. RLE - sensation intact, 2 sites. 5/5 strength, reflexes grossly intact. LLE - sensation intact, 2 sites. 5/5 strength, reflexes grossly intact.  Coordination: Finger to nose: +difficulty of the L side Finger tapping: + delay on the L side Heel-Knee-Shin: deferred Gait exam - deferred.  Speech Repetition: "No ifs ands or buts about it, today is a nice day" - "No buts and ifs. Today is..." Fluency: No word finding difficulty, no slurring.   EKG: personally reviewed my interpretation is high degree of movement artifact, age indeterminate ST elevation. RBBB, LAFB   CT Angio Head/Neck 11/23 IMPRESSION: 1. Negative for emergent large vessel occlusion, but positive for severe atherosclerosis in the head and neck, most notable for: - Critical stenoses BOTH Vertebral Arteries (proximal  right vertebral, and distal left vertebral which is sole supply to the Basilar). - Severe stenosis Right ICA siphon. - Moderate stenosis Right PCA P1. - Mild to moderate stenosis left ICA siphon. 2.  Aortic Atherosclerosis (ICD10-I70.0).  Prior CABG. 3. Postoperative and degenerative cervical spinal ankylosis.  CT Head Code Stroke 11/23: IMPRESSION: 1. No acute cortically based infarct or acute intracranial hemorrhage identified. ASPECTS 10. 2. Small chronic posterior right MCA territory infarct and advanced chronic bilateral white matter disease.  Assessment & Plan by Problem: #Acute Cerebrovascular Accident Patient presented from home via EMS with concerns for ongoing weakness and sensation of his left arm. Patient is a poor historian. -Admission to IMTS, progressive floor, attending physician Dr Johny Sax - Telemetry - Aspirin / plavix - MRI pending - PT/OT eval pending - Echo with bubble study pending - Neurology following, appreciate their recommendations  Chronic Conditions: D2TM - Insulin sliding scale, 12 units lantus daily, 3 units lispro at lunch and dinner, empagliflozin, glimepiride  Angina - nitroglycerin 0.4 mg SL PRN HTN - holding home meds for permissive htn HLD - atorvastatin 40 HFrecEF - limit fluids to 2L daily,  Chronic Opioid Dependence - tramadol 50 BID PRN, polyethylene glycol daily Chronic Benzodiazepine Use - CIWA w/o ativan BPH - flomax 0.4 mg Insomnia - melatonin 10    FEN: regular, passed speech. VTE: xarelto 10 mg Code status: Full Dispo: TBD  Dispo: Admit patient to Inpatient with expected length of stay greater than 2 midnights.  Signed: Margaretmary Dys, MD 01/06/2023, 10:33 AM  Pager: [phone] After 5pm on weekdays and 1pm on weekends: On Call pager: [phone]

## 2023-01-06 NOTE — ED Triage Notes (Signed)
Pt BIB EMS for left arm numbness starting sometime in the middle of the night, unsure of time. LKW 2200. Pt states no recent falls, family on scene says pt is not always honest about falls. Hx of head bleed after falls. On Plavix.

## 2023-01-06 NOTE — ED Notes (Signed)
Attempted to call wife [name] but no answer

## 2023-01-06 NOTE — ED Notes (Signed)
ED TO INPATIENT HANDOFF REPORT  ED Nurse Name and Phone #: Marchelle Folks 61  S Name/Age/Gender Ricardo Le 82 y.o. male Room/Bed: 001C/001C  Code Status   Code Status: Full Code  Home/SNF/Other Home Patient oriented to: self, place, time, and situation Is this baseline? Yes   Triage Complete: Triage complete  Chief Complaint Acute CVA (cerebrovascular accident) [facility]) [I63.9]  Triage Note Pt BIB EMS for left arm numbness starting sometime in the middle of the night, unsure of time. LKW 2200. Pt states no recent falls, family on scene says pt is not always honest about falls. Hx of head bleed after falls. On Plavix.    Allergies Allergies  Allergen Reactions   Bentyl [Dicyclomine] Anaphylaxis   Crestor [Rosuvastatin Calcium]    Ioversol    Oxycodone Hcl Nausea And Vomiting   Ranexa [Ranolazine] Other (See Comments)    Caused weakness (possibly)   Rosuvastatin Calcium Other (See Comments)    Muscle pain   Rosuvastatin Calcium Other (See Comments)    Myalgias Muscle pain   Tramadol Other (See Comments)    Makes patient "spaced out and dizzy"   Iodinated Contrast Media Other (See Comments) and Rash   Iohexol Hives and Itching    Patient is ok with premedication (Benadryl 50 mg by mouth); Onset Date: 12/03/2007  Patient is ok with premedication (Benadryl 50 mg by mouth); Onset Date: 12/03/2007   Statins Other (See Comments)    Myalgias Able to tolerate atorvastatin    Level of Care/Admitting Diagnosis ED Disposition     ED Disposition  Admit   Condition  --   Comment  Hospital Area: [facility] [[id number]]  Level of Care: Telemetry Medical [[id number]]  May place patient in observation at [facility] or [facility] if equivalent level of care is available:: No  Covid Evaluation: Asymptomatic - no recent exposure (last 10 days) testing not required  Diagnosis: Acute CVA (cerebrovascular accident) [facility]) [[id number]]  Admitting Physician: Yevette Edwards  Attending Physician: HATCHER, JEFFREY C [[id number]]          B Medical/Surgery History Past Medical History:  Diagnosis Date   CAD (coronary artery disease)    a. 04/2007 s/p CABG x 4  - LIMA->LAD, VG->OM1->Om2, VG->PDA;  b. 12/2008 & 06/2010 Caths - Native multivessel dzs with 4/4 patent grafts. c. cath 03/09/2015 patent grafts, unchanged from prior cath.   Cancer [facility])    pancreatic  March 2011   Carotid bruit    a. 07/2010 U/S- 0-39% bilat ICA stenosis   Chest pain    Noncardiac probably related to reflux   DDD (degenerative disc disease)    several surgeries   Fracture acetabulum-closed (HCC) 07/08/2018   GERD (gastroesophageal reflux disease)    HNP (herniated nucleus pulposus), lumbar    a. L2-3, s/p laminotomy, microdiskectomy 02/2009   HOH (hard of hearing)    uses amplifiers    Nephrolithiasis    PAD (peripheral artery disease) (HCC) 04/2014   s/p L Common Iliac Stent   Palpitations    Pancreatic mass    a. 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   Pneumonia    hx of  pna   Pure hypercholesterolemia    statin intolerance   Type II or unspecified type diabetes mellitus without mention of complication, not stated as uncontrolled    insulin dependent   Unspecified essential hypertension    Past Surgical History:  Procedure Laterality Date   ABDOMINAL  AORTAGRAM N/A 05/13/2014   Procedure: ABDOMINAL Ronny Flurry;  Surgeon: Iran Ouch, MD;  Location: [facility] CATH LAB;  Service: Cardiovascular;  Laterality: N/A;   ANTERIOR CERVICAL DECOMP/DISCECTOMY FUSION N/A 07/28/2014   Procedure: ANTERIOR CERVICAL DECOMPRESSION/DISCECTOMY FUSION CERVICAL FOUR-FIVE CERVICAL FIVE-SIX ;  Surgeon: Julio Sicks, MD;  Location: [facility] NEURO ORS;  Service: Neurosurgery;  Laterality: N/A;   APPENDECTOMY     arthroscopy right knee     BACK SURGERY     CARDIAC CATHETERIZATION N/A 03/09/2015   Procedure: Left Heart Cath and Cors/Grafts Angiography;  Surgeon: Lennette Bihari, MD;   Location: [facility] INVASIVE CV LAB;  Service: Cardiovascular;  Laterality: N/A;   CHOLECYSTECTOMY     CORONARY ARTERY BYPASS GRAFT     X 4 2009 (LIMA to LAD, SVG to first cicumflex marginal branch with sequential SVG to second cicumflex marginal branch  and saphenous vein to posterior descending coronary artery with endoscopic,vein harvest rt. lower exttremity by Dr.Owen on March 31,2009   CORONARY/GRAFT ACUTE MI REVASCULARIZATION N/A 11/06/2020   Procedure: Coronary/Graft Acute MI Revascularization;  Surgeon: Swaziland, Peter M, MD;  Location: [facility] INVASIVE CV LAB;  Service: Cardiovascular;  Laterality: N/A;   CYST REMOVED     FROM SPINE   CYSTOURETHROSCOPY     right retrograde pyelogram,manipulate stone in the renal pelvis, rt. double-j catheter.   EYE SURGERY     KNEE SURGERY     rt knee   LEFT HEART CATH AND CORONARY ANGIOGRAPHY N/A 11/06/2020   Procedure: LEFT HEART CATH AND CORONARY ANGIOGRAPHY;  Surgeon: Swaziland, Peter M, MD;  Location: [facility] INVASIVE CV LAB;  Service: Cardiovascular;  Laterality: N/A;   LEFT HEART CATH AND CORS/GRAFTS ANGIOGRAPHY N/A 02/20/2017   Procedure: LEFT HEART CATH AND CORS/GRAFTS ANGIOGRAPHY;  Surgeon: Marykay Lex, MD;  Location: [facility] INVASIVE CV LAB;  Service: Cardiovascular;  Laterality: N/A;   LEFT HEART CATH AND CORS/GRAFTS ANGIOGRAPHY N/A 05/28/2018   Procedure: LEFT HEART CATH AND CORS/GRAFTS ANGIOGRAPHY;  Surgeon: Kathleene Hazel, MD;  Location: [facility] INVASIVE CV LAB;  Service: Cardiovascular;  Laterality: N/A;   NEPHROLITHOTOMY     PERCUTANEOUS   PARS PLANA VITRECTOMY     lt. eye,retinal photocoagulation lt. eye, membrane peel lt. eye.   PERCUTANEOUS STENT INTERVENTION Left 05/13/2014   Procedure: PERCUTANEOUS STENT INTERVENTION;  Surgeon: Iran Ouch, MD;  Location: [facility] CATH LAB;  Service: Cardiovascular;  Laterality: Left;  COMMON ILLIAC   re-exploratin of laminectomy  05/2008   (RT L2-3) WITH REDO MICRODISKECTOMY     A IV  Location/Drains/Wounds Patient Lines/Drains/Airways Status     Active Line/Drains/Airways     Name Placement date Placement time Site Days   Peripheral IV 04/29/21 18 G Anterior;Left;Proximal Forearm 04/29/21  1132  Forearm  617   Peripheral IV 01/06/23 20 G Anterior;Right Forearm 01/06/23  0815  Forearm  less than 1   Incision (Closed) 07/28/14 Neck Other (Comment) 07/28/14  0940  -- 3084   Pressure Injury 07/23/18 Sacrum Medial Stage II -  Partial thickness loss of dermis presenting as a shallow open ulcer with a red, pink wound bed without slough. 07/23/18  1859  -- 1628   Pressure Injury 07/23/18 Heel Left Stage I -  Intact skin with non-blanchable redness of a localized area usually over a bony prominence. soft boggy pink heel  07/23/18  2145  -- 1628   Pressure Injury 07/23/18 Heel Right Stage I -  Intact skin with non-blanchable redness of a localized area usually over a  bony prominence. pink boggy heel 07/23/18  2145  -- 1628            Intake/Output Last 24 hours No intake or output data in the 24 hours ending 01/06/23 1217  Labs/Imaging Results for orders placed or performed during the hospital encounter of 01/06/23 (from the past 48 hour(s))  Urine rapid drug screen (hosp performed)     Status: None   Collection Time: 01/06/23  8:39 AM  Result Value Ref Range   Opiates NONE DETECTED NONE DETECTED   Cocaine NONE DETECTED NONE DETECTED   Benzodiazepines NONE DETECTED NONE DETECTED   Amphetamines NONE DETECTED NONE DETECTED   Tetrahydrocannabinol NONE DETECTED NONE DETECTED   Barbiturates NONE DETECTED NONE DETECTED    Comment: (NOTE) DRUG SCREEN FOR MEDICAL PURPOSES ONLY.  IF CONFIRMATION IS NEEDED FOR ANY PURPOSE, NOTIFY LAB WITHIN 5 DAYS.  LOWEST DETECTABLE LIMITS FOR URINE DRUG SCREEN Drug Class                     Cutoff (ng/mL) Amphetamine and metabolites    1000 Barbiturate and metabolites    200 Benzodiazepine                 200 Opiates and metabolites         300 Cocaine and metabolites        300 THC                            50 Performed at [facility] Lab, [address] [place], [place] [address]   Ethanol     Status: None   Collection Time: 01/06/23  8:40 AM  Result Value Ref Range   Alcohol, Ethyl (B) <10 <10 mg/dL    Comment: (NOTE) Lowest detectable limit for serum alcohol is 10 mg/dL.  For medical purposes only. Performed at [facility], [address], [place], [place] [address]   Protime-INR     Status: None   Collection Time: 01/06/23  8:40 AM  Result Value Ref Range   Prothrombin Time 13.9 11.4 - 15.2 seconds   INR 1.0 0.8 - 1.2    Comment: (NOTE) INR goal varies based on device and disease states. Performed at [facility], [address], [place], [place] [address]   APTT     Status: None   Collection Time: 01/06/23  8:40 AM  Result Value Ref Range   aPTT 25 24 - 36 seconds    Comment: Performed at [facility], [address], [place], [place] [address]  CBC     Status: None   Collection Time: 01/06/23  8:40 AM  Result Value Ref Range   WBC 10.3 4.0 - 10.5 K/uL   RBC 5.18 4.22 - 5.81 MIL/uL   Hemoglobin 15.4 13.0 - 17.0 g/dL   HCT 53.6 64.4 - 03.4 %   MCV 90.0 80.0 - 100.0 fL   MCH 29.7 26.0 - 34.0 pg   MCHC 33.0 30.0 - 36.0 g/dL   RDW 74.2 59.5 - 63.8 %   Platelets 228 150 - 400 K/uL   nRBC 0.0 0.0 - 0.2 %    Comment: Performed at [facility] Lab, [address], [place], [place] [address]  Differential     Status: Abnormal   Collection Time: 01/06/23  8:40 AM  Result Value Ref Range   Neutrophils Relative % 58 %  Neutro Abs 5.9 1.7 - 7.7 K/uL   Lymphocytes Relative 27 %   Lymphs Abs 2.7 0.7 - 4.0 K/uL   Monocytes Relative 13 %   Monocytes Absolute 1.4 (H) 0.1 - 1.0 K/uL   Eosinophils Relative 1 %   Eosinophils Absolute 0.1 0.0 - 0.5 K/uL   Basophils Relative 1 %   Basophils Absolute 0.1 0.0 - 0.1 K/uL   Immature Granulocytes 0 %   Abs Immature Granulocytes 0.03  0.00 - 0.07 K/uL    Comment: Performed at [facility] Lab, [address], [place], [place] [address]  Comprehensive metabolic panel     Status: Abnormal   Collection Time: 01/06/23  8:40 AM  Result Value Ref Range   Sodium 139 135 - 145 mmol/L   Potassium 4.1 3.5 - 5.1 mmol/L   Chloride 102 98 - 111 mmol/L   CO2 27 22 - 32 mmol/L   Glucose, Bld 146 (H) 70 - 99 mg/dL    Comment: Glucose reference range applies only to samples taken after fasting for at least 8 hours.   BUN 25 (H) 8 - 23 mg/dL   Creatinine, Ser 0.27 (H) 0.61 - 1.24 mg/dL   Calcium 9.7 8.9 - 25.3 mg/dL   Total Protein 8.4 (H) 6.5 - 8.1 g/dL   Albumin 4.1 3.5 - 5.0 g/dL   AST 24 15 - 41 U/L   ALT 27 0 - 44 U/L   Alkaline Phosphatase 66 38 - 126 U/L   Total Bilirubin 1.1 <1.2 mg/dL   GFR, Estimated 56 (L) >60 mL/min    Comment: (NOTE) Calculated using the CKD-EPI Creatinine Equation (2021)    Anion gap 10 5 - 15    Comment: Performed at [facility] Lab, [address], [place], [place] [address]  I-stat chem 8, ED     Status: Abnormal   Collection Time: 01/06/23  8:48 AM  Result Value Ref Range   Sodium 141 135 - 145 mmol/L   Potassium 4.2 3.5 - 5.1 mmol/L   Chloride 101 98 - 111 mmol/L   BUN 30 (H) 8 - 23 mg/dL   Creatinine, Ser 3.47 0.61 - 1.24 mg/dL   Glucose, Bld 425 (H) 70 - 99 mg/dL    Comment: Glucose reference range applies only to samples taken after fasting for at least 8 hours.   Calcium, Ion 1.14 (L) 1.15 - 1.40 mmol/L   TCO2 29 22 - 32 mmol/L   Hemoglobin 16.3 13.0 - 17.0 g/dL   HCT 95.6 38.7 - 56.4 %  Urinalysis, Routine w reflex microscopic -Urine, Clean Catch     Status: Abnormal   Collection Time: 01/06/23 11:28 AM  Result Value Ref Range   Color, Urine YELLOW YELLOW   APPearance CLEAR CLEAR   Specific Gravity, Urine 1.040 (H) 1.005 - 1.030   pH 8.0 5.0 - 8.0   Glucose, UA >=500 (A) NEGATIVE mg/dL   Hgb urine dipstick NEGATIVE NEGATIVE   Bilirubin Urine NEGATIVE NEGATIVE   Ketones,  ur 5 (A) NEGATIVE mg/dL   Protein, ur NEGATIVE NEGATIVE mg/dL   Nitrite NEGATIVE NEGATIVE   Leukocytes,Ua NEGATIVE NEGATIVE   RBC / HPF 0-5 0 - 5 RBC/hpf   WBC, UA 0-5 0 - 5 WBC/hpf   Bacteria, UA NONE SEEN NONE SEEN   Squamous Epithelial / HPF 0-5 0 - 5 /HPF    Comment: Performed at [facility] Lab, [address], [place], [place] [address]   CT ANGIO HEAD  NECK W WO CM (CODE STROKE)  Result Date: 01/06/2023 CLINICAL DATA:  82 year old male code stroke presentation. Left side deficits. EXAM: CT ANGIOGRAPHY HEAD AND NECK WITH AND WITHOUT CONTRAST TECHNIQUE: Multidetector CT imaging of the head and neck was performed using the standard protocol during bolus administration of intravenous contrast. Multiplanar CT image reconstructions and MIPs were obtained to evaluate the vascular anatomy. Carotid stenosis measurements (when applicable) are obtained utilizing NASCET criteria, using the distal internal carotid diameter as the denominator. RADIATION DOSE REDUCTION: This exam was performed according to the departmental dose-optimization program which includes automated exposure control, adjustment of the mA and/or kV according to patient size and/or use of iterative reconstruction technique. CONTRAST:  75mL OMNIPAQUE IOHEXOL 350 MG/ML SOLN COMPARISON:  Plain head CT today 0856 hours FINDINGS: CTA NECK Skeleton: Previous cervical ACDF and widespread cervical spine ankylosis. Sternotomy. No acute osseous abnormality identified. Upper chest: Prior CABG. Other neck: Negative for age. Aortic arch: Calcified aortic atherosclerosis. 4 vessel arch configuration. Right carotid system: Plaque without significant stenosis before the bifurcation. Bulky calcified more so than soft plaque at the right ICA origin and bulb. Subsequent up to 50 % stenosis with respect to the distal vessel. Left carotid system: Atherosclerosis but no stenosis before the bifurcation. Bulky calcified plaque throughout the bifurcation,  proximal left ICA to the bulb. Stenosis up to 50 % with respect to the distal vessel. Additional soft and calcified plaque as the left ICA enters the skull on series 8, image 72, only mild stenosis. Vertebral arteries: Proximal right subclavian artery plaque without stenosis. Bulky calcified plaque at the right vertebral artery origin with severe stenosis (series 6, and tandem critical stenosis in the right V1 segment on series 9, image 46, short segment occlusion is possible but the vessel reconstitutes. Right vertebral enhancement in the neck is slightly decreased compared to that on the left side. There is additional V2 and V3 plaque but no other high-grade stenosis before the posterior fossa. Left vertebral arises directly from the arch with no stenosis. Mildly dominant appearing left vertebral is patent to the skull base with no significant plaque or stenosis. CTA HEAD Posterior circulation: Severe stenosis and near occlusion right vertebral V4 segment beginning on series 6, image 160. But there is faint enhancement to the right PICA. Contralateral left vertebral artery supplies the basilar although with severe calcified plaque and string sign stenosis in the proximal V4 segment series 6, image 157. Distal left V4 is patent but smaller. Left PICA remains patent. Basilar artery is patent without stenosis. SCA and PCA origins are patent with fetal type left PCA. Moderate stenosis right PCA P1 segment (series 12, image 22). And mild to moderate irregularity and stenosis at the left PCA P2/P3 junction on series 13, image 26. No PCA branch occlusion. Anterior circulation: Both ICA siphons are heavily calcified but remain patent. Mild to moderate left siphon stenosis. Normal left posterior communicating artery. Moderate to severe right siphon stenosis in the cavernous and supraclinoid segments. See series 6, image 111 and series 10, image 108. Patent carotid termini, MCA and ACA origins appear normal. Anterior  communicating artery is normal. Bilateral ACA branches are within normal limits. Left MCA M1 segment and trifurcation are patent without stenosis. Left MCA branches are patent with mild irregularity. Right MCA M1 segment is patent with mild irregularity. No M1 stenosis. Patent right MCA bifurcation. Patent right MCA branches with mild irregularity of the dominant posterior M2. No right MCA branch occlusion is identified. Venous sinuses: Early  contrast timing, grossly patent. Anatomic variants: Affectively dominant left vertebral artery which supplies the basilar arises directly from the arch. Fetal type left PCA origin. Review of the MIP images confirms the above findings IMPRESSION: 1. Negative for emergent large vessel occlusion, but positive for severe atherosclerosis in the head and neck, most notable for: - Critical stenoses BOTH Vertebral Arteries (proximal right vertebral, and distal left vertebral which is sole supply to the Basilar). - Severe stenosis Right ICA siphon. - Moderate stenosis Right PCA P1. - Mild to moderate stenosis left ICA siphon. 2.  Aortic Atherosclerosis (ICD10-I70.0).  Prior CABG. 3. Postoperative and degenerative cervical spinal ankylosis. Electronically Signed   By: Odessa Fleming M.D.   On: 01/06/2023 09:42   CT HEAD CODE STROKE WO CONTRAST  Result Date: 01/06/2023 CLINICAL DATA:  Code stroke. 82 year old male with left side deficit. EXAM: CT HEAD WITHOUT CONTRAST TECHNIQUE: Contiguous axial images were obtained from the base of the skull through the vertex without intravenous contrast. RADIATION DOSE REDUCTION: This exam was performed according to the departmental dose-optimization program which includes automated exposure control, adjustment of the mA and/or kV according to patient size and/or use of iterative reconstruction technique. COMPARISON:  Brain MRI 05/29/2014.  Head CT 07/27/2021. FINDINGS: Brain: Stable cerebral volume. No midline shift, mass effect, or evidence of  intracranial mass lesion. No acute intracranial hemorrhage identified. Stable ventricle size and configuration. Small area of chronic cortical encephalomalacia right perirolandic region series 2, image 26 is stable from last year, new from the previous MRI. Bilateral Patchy and confluent cerebral white matter hypodensity elsewhere appears stable. No cortically based acute infarct identified. Vascular: Extensive Calcified atherosclerosis at the skull base. No suspicious intracranial vascular hyperdensity. Skull: No acute osseous abnormality identified. Sinuses/Orbits: Stable mild chronic sinus mucosal thickening. Tympanic cavities and mastoids remain clear. Other: No gaze deviation. No acute orbit or scalp soft tissue finding. ASPECTS [facility] Stroke Program Early CT Score) Total score (0-10 with 10 being normal): 10 (chronic encephalomalacia) IMPRESSION: 1. No acute cortically based infarct or acute intracranial hemorrhage identified. ASPECTS 10. 2. Small chronic posterior right MCA territory infarct and advanced chronic bilateral white matter disease. 3. These results were communicated to Dr. Derry Lory at 9:00 am on 01/06/2023 by text page via the [facility] messaging system. Electronically Signed   By: Odessa Fleming M.D.   On: 01/06/2023 09:01    Pending Labs Unresulted Labs (From admission, onward)     Start     Ordered   01/07/23 0500  Basic metabolic panel  Tomorrow morning,   R        01/06/23 1158   01/07/23 0500  CBC  Tomorrow morning,   R        01/06/23 1158   01/06/23 1215  Hemoglobin A1c  Once,   R       Comments: To assess prior glycemic control    01/06/23 1215            Vitals/Pain Today's Vitals   01/06/23 0810 01/06/23 0814 01/06/23 0930 01/06/23 1115  BP:  (!) 156/69 (!) 160/74 (!) 166/65  Pulse:  (!) 59 66 66  Resp:  (!) 40 (!) 27 11  SpO2:  97% 99% 98%  Weight: 79.4 kg     Height: 5\' 5"  (1.651 m)     PainSc:        Isolation Precautions No active  isolations  Medications Medications  rivaroxaban (XARELTO) tablet 10 mg (has no administration in time range)  acetaminophen (  TYLENOL) tablet 650 mg (has no administration in time range)    Or  acetaminophen (TYLENOL) suppository 650 mg (has no administration in time range)  polyethylene glycol (MIRALAX / GLYCOLAX) packet 17 g (has no administration in time range)  aspirin EC tablet 81 mg (has no administration in time range)  atorvastatin (LIPITOR) tablet 40 mg (has no administration in time range)  tamsulosin (FLOMAX) capsule 0.4 mg (has no administration in time range)  insulin aspart (novoLOG) injection 0-9 Units (has no administration in time range)  diphenhydrAMINE (BENADRYL) injection 50 mg (50 mg Intravenous Given 01/06/23 0920)  methylPREDNISolone sodium succinate (SOLU-MEDROL) 125 mg/2 mL injection 125 mg (250 mg Intravenous Given 01/06/23 0926)  iohexol (OMNIPAQUE) 350 MG/ML injection 75 mL (75 mLs Intravenous Contrast Given 01/06/23 0921)  lactated ringers bolus 1,000 mL (1,000 mLs Intravenous New Bag/Given 01/06/23 1020)    Mobility walks with device     Focused Assessments Neuro Assessment Handoff:  Swallow screen pass?  Unknown until after mri Cardiac Rhythm: Normal sinus rhythm NIH Stroke Scale  Dizziness Present: No Headache Present: No Interval: Other (Comment) (q2h) Level of Consciousness (1a.)   : Alert, keenly responsive LOC Questions (1b. )   : Answers both questions correctly LOC Commands (1c. )   : Performs both tasks correctly Best Gaze (2. )  : Normal Visual (3. )  : No visual loss Facial Palsy (4. )    : Normal symmetrical movements Motor Arm, Left (5a. )   : No drift Motor Arm, Right (5b. ) : No drift Motor Leg, Left (6a. )  : Drift Motor Leg, Right (6b. ) : No drift Limb Ataxia (7. ): Absent Sensory (8. )  : Mild-to-moderate sensory loss, patient feels pinprick is less sharp or is dull on the affected side, or there is a loss of superficial pain  with pinprick, but patient is aware of being touched Best Language (9. )  : No aphasia Dysarthria (10. ): Normal Extinction/Inattention (11.)   : No Abnormality Complete NIHSS TOTAL: 2 Last date known well: 01/05/23 Last time known well: 2200 Neuro Assessment:   Neuro Checks:   Other (Comment) (q2h) (01/06/23 1100)  Has TPA been given? No If patient is a Neuro Trauma and patient is going to OR before floor call report to 4N Charge nurse: [phone] or [phone]   R Recommendations: See Admitting Provider Note  Report given to:   Additional Notes:

## 2023-01-06 NOTE — ED Provider Notes (Signed)
[facility] AT [facility] Provider Note   CSN: [id number] Arrival date & time: 01/06/23  0805     History  Chief Complaint  Patient presents with   Numbness   Code Stroke    Ricardo Le is a 82 y.o. male.  HPI Patient presents for left arm numbness.  Medical history includes DM, HTN, HLD, CAD, VTE, GERD.  He had some mild numbness to his left arm that he noticed last night.  He went to bed at around 10 PM.  When he woke up this morning, numbness was worse.  He feels numbness to the left side of his face.  He has left-sided weakness.  He denies any other current symptoms.    Home Medications Prior to Admission medications   Medication Sig Start Date End Date Taking? Authorizing Provider  acetaminophen (TYLENOL) 325 MG tablet Take 650 mg by mouth every 6 (six) hours as needed (for pain).     [provider]  albuterol (VENTOLIN HFA) 108 (90 Base) MCG/ACT inhaler Inhale 2 puffs into the lungs every 6 (six) hours as needed for wheezing or shortness of breath. 06/13/22   [provider]  aspirin EC 81 MG tablet Take 81 mg by mouth daily.    [provider]  atorvastatin (LIPITOR) 40 MG tablet Take 1 tablet (40 mg total) by mouth daily. 11/25/20   Iran Ouch, MD  BD PEN NEEDLE NANO 2ND GEN 32G X 4 MM MISC 1 each by Other route daily. 07/01/19   [provider]  bisacodyl (DULCOLAX) 5 MG EC tablet Take 5 mg by mouth every 3 (three) days.     [provider]  Blood Glucose Monitoring Suppl (ONETOUCH VERIO FLEX SYSTEM) w/Device KIT 1 each by Other route as directed. 01/09/20   [provider]  clopidogrel (PLAVIX) 75 MG tablet Take 1 tablet (75 mg total) by mouth daily. 08/08/22   Wendall Stade, MD  glimepiride (AMARYL) 2 MG tablet Take 4 mg by mouth daily. 06/17/19   [provider]  Insulin Glargine (LANTUS) 100 UNIT/ML Solostar Pen Inject 18 Units into the skin at bedtime. Patient  taking differently: Inject 14 Units into the skin at bedtime. 07/30/18   Angiulli, Mcarthur Rossetti, PA-C  lisinopril (ZESTRIL) 20 MG tablet Take 1 tablet (20 mg total) by mouth daily. 09/13/22 12/12/22  Iran Ouch, MD  Melatonin 3 MG TABS Take 1 tablet (3 mg total) by mouth at bedtime. 07/30/18   Angiulli, Mcarthur Rossetti, PA-C  metoprolol succinate (TOPROL-XL) 25 MG 24 hr tablet Take 1 tablet (25 mg total) by mouth daily. 11/25/20   Iran Ouch, MD  Multiple Vitamin (MULTIVITAMIN WITH MINERALS) TABS tablet Take 1 tablet by mouth daily.    [provider]  nitroGLYCERIN (NITROSTAT) 0.4 MG SL tablet DISSOLVE 1 TABLET UNDER THE TONGUE EVERY 5 MINUTES AS NEEDED FOR CHEST PAIN 02/09/22   Wendall Stade, MD  [facility] VERIO test strip 1 each by Other route 2 (two) times daily. 07/15/19   [provider]  pantoprazole (PROTONIX) 40 MG tablet Take 40 mg by mouth 2 (two) times daily. 02/27/20   [provider]  tamsulosin (FLOMAX) 0.4 MG CAPS capsule Take 1 capsule (0.4 mg total) by mouth daily after supper. 07/30/18   Angiulli, Mcarthur Rossetti, PA-C  traMADol (ULTRAM) 50 MG tablet Take 50 mg by mouth 2 (two) times daily as needed for moderate pain.    [provider]  Allergies    Dicyclomine, Ioversol, Oxycodone hcl, Ranexa [ranolazine], Rosuvastatin calcium, Rosuvastatin calcium, Tramadol, Iodinated contrast media, Iohexol, and Statins    Review of Systems   Review of Systems  Neurological:  Positive for weakness and numbness.  All other systems reviewed and are negative.   Physical Exam Updated Vital Signs BP (!) 160/74   Pulse 66   Resp (!) 27   Ht 5\' 5"  (1.651 m)   Wt 79.4 kg   SpO2 99%   BMI 29.12 kg/m  Physical Exam Vitals and nursing note reviewed.  Constitutional:      General: He is not in acute distress.    Appearance: Normal appearance. He is well-developed. He is not ill-appearing, toxic-appearing or diaphoretic.  HENT:     Head: Normocephalic and  atraumatic.     Right Ear: External ear normal.     Left Ear: External ear normal.     Nose: Nose normal.     Mouth/Throat:     Mouth: Mucous membranes are moist.  Eyes:     General: No visual field deficit.    Extraocular Movements: Extraocular movements intact.     Conjunctiva/sclera: Conjunctivae normal.  Cardiovascular:     Rate and Rhythm: Normal rate and regular rhythm.  Pulmonary:     Effort: Pulmonary effort is normal. No respiratory distress.  Abdominal:     General: There is no distension.     Palpations: Abdomen is soft.     Tenderness: There is no abdominal tenderness.  Musculoskeletal:        General: No swelling or deformity.     Cervical back: Normal range of motion and neck supple.     Right lower leg: No edema.     Left lower leg: No edema.  Skin:    General: Skin is warm and dry.     Coloration: Skin is not jaundiced or pale.  Neurological:     Mental Status: He is alert and oriented to person, place, and time.     Cranial Nerves: Facial asymmetry present. No dysarthria.     Sensory: Sensory deficit present.     Motor: Weakness and pronator drift present.  Psychiatric:        Mood and Affect: Mood normal.     ED Results / Procedures / Treatments   Labs (all labs ordered are listed, but only abnormal results are displayed) Labs Reviewed  DIFFERENTIAL - Abnormal; Notable for the following components:      Result Value   Monocytes Absolute 1.4 (*)    All other components within normal limits  COMPREHENSIVE METABOLIC PANEL - Abnormal; Notable for the following components:   Glucose, Bld 146 (*)    BUN 25 (*)    Creatinine, Ser 1.28 (*)    Total Protein 8.4 (*)    GFR, Estimated 56 (*)    All other components within normal limits  I-STAT CHEM 8, ED - Abnormal; Notable for the following components:   BUN 30 (*)    Glucose, Bld 144 (*)    Calcium, Ion 1.14 (*)    All other components within normal limits  ETHANOL  PROTIME-INR  APTT  CBC  RAPID  URINE DRUG SCREEN, HOSP PERFORMED  URINALYSIS, ROUTINE W REFLEX MICROSCOPIC    EKG None  Radiology CT ANGIO HEAD NECK W WO CM (CODE STROKE)  Result Date: 01/06/2023 CLINICAL DATA:  82 year old male code stroke presentation. Left side deficits. EXAM: CT ANGIOGRAPHY HEAD AND NECK WITH AND WITHOUT CONTRAST  TECHNIQUE: Multidetector CT imaging of the head and neck was performed using the standard protocol during bolus administration of intravenous contrast. Multiplanar CT image reconstructions and MIPs were obtained to evaluate the vascular anatomy. Carotid stenosis measurements (when applicable) are obtained utilizing NASCET criteria, using the distal internal carotid diameter as the denominator. RADIATION DOSE REDUCTION: This exam was performed according to the departmental dose-optimization program which includes automated exposure control, adjustment of the mA and/or kV according to patient size and/or use of iterative reconstruction technique. CONTRAST:  75mL OMNIPAQUE IOHEXOL 350 MG/ML SOLN COMPARISON:  Plain head CT today 0856 hours FINDINGS: CTA NECK Skeleton: Previous cervical ACDF and widespread cervical spine ankylosis. Sternotomy. No acute osseous abnormality identified. Upper chest: Prior CABG. Other neck: Negative for age. Aortic arch: Calcified aortic atherosclerosis. 4 vessel arch configuration. Right carotid system: Plaque without significant stenosis before the bifurcation. Bulky calcified more so than soft plaque at the right ICA origin and bulb. Subsequent up to 50 % stenosis with respect to the distal vessel. Left carotid system: Atherosclerosis but no stenosis before the bifurcation. Bulky calcified plaque throughout the bifurcation, proximal left ICA to the bulb. Stenosis up to 50 % with respect to the distal vessel. Additional soft and calcified plaque as the left ICA enters the skull on series 8, image 72, only mild stenosis. Vertebral arteries: Proximal right subclavian artery plaque  without stenosis. Bulky calcified plaque at the right vertebral artery origin with severe stenosis (series 6, and tandem critical stenosis in the right V1 segment on series 9, image 46, short segment occlusion is possible but the vessel reconstitutes. Right vertebral enhancement in the neck is slightly decreased compared to that on the left side. There is additional V2 and V3 plaque but no other high-grade stenosis before the posterior fossa. Left vertebral arises directly from the arch with no stenosis. Mildly dominant appearing left vertebral is patent to the skull base with no significant plaque or stenosis. CTA HEAD Posterior circulation: Severe stenosis and near occlusion right vertebral V4 segment beginning on series 6, image 160. But there is faint enhancement to the right PICA. Contralateral left vertebral artery supplies the basilar although with severe calcified plaque and string sign stenosis in the proximal V4 segment series 6, image 157. Distal left V4 is patent but smaller. Left PICA remains patent. Basilar artery is patent without stenosis. SCA and PCA origins are patent with fetal type left PCA. Moderate stenosis right PCA P1 segment (series 12, image 22). And mild to moderate irregularity and stenosis at the left PCA P2/P3 junction on series 13, image 26. No PCA branch occlusion. Anterior circulation: Both ICA siphons are heavily calcified but remain patent. Mild to moderate left siphon stenosis. Normal left posterior communicating artery. Moderate to severe right siphon stenosis in the cavernous and supraclinoid segments. See series 6, image 111 and series 10, image 108. Patent carotid termini, MCA and ACA origins appear normal. Anterior communicating artery is normal. Bilateral ACA branches are within normal limits. Left MCA M1 segment and trifurcation are patent without stenosis. Left MCA branches are patent with mild irregularity. Right MCA M1 segment is patent with mild irregularity. No M1  stenosis. Patent right MCA bifurcation. Patent right MCA branches with mild irregularity of the dominant posterior M2. No right MCA branch occlusion is identified. Venous sinuses: Early contrast timing, grossly patent. Anatomic variants: Affectively dominant left vertebral artery which supplies the basilar arises directly from the arch. Fetal type left PCA origin. Review of the MIP images confirms  the above findings IMPRESSION: 1. Negative for emergent large vessel occlusion, but positive for severe atherosclerosis in the head and neck, most notable for: - Critical stenoses BOTH Vertebral Arteries (proximal right vertebral, and distal left vertebral which is sole supply to the Basilar). - Severe stenosis Right ICA siphon. - Moderate stenosis Right PCA P1. - Mild to moderate stenosis left ICA siphon. 2.  Aortic Atherosclerosis (ICD10-I70.0).  Prior CABG. 3. Postoperative and degenerative cervical spinal ankylosis. Electronically Signed   By: Odessa Fleming M.D.   On: 01/06/2023 09:42   CT HEAD CODE STROKE WO CONTRAST  Result Date: 01/06/2023 CLINICAL DATA:  Code stroke. 82 year old male with left side deficit. EXAM: CT HEAD WITHOUT CONTRAST TECHNIQUE: Contiguous axial images were obtained from the base of the skull through the vertex without intravenous contrast. RADIATION DOSE REDUCTION: This exam was performed according to the departmental dose-optimization program which includes automated exposure control, adjustment of the mA and/or kV according to patient size and/or use of iterative reconstruction technique. COMPARISON:  Brain MRI 05/29/2014.  Head CT 07/27/2021. FINDINGS: Brain: Stable cerebral volume. No midline shift, mass effect, or evidence of intracranial mass lesion. No acute intracranial hemorrhage identified. Stable ventricle size and configuration. Small area of chronic cortical encephalomalacia right perirolandic region series 2, image 26 is stable from last year, new from the previous MRI. Bilateral  Patchy and confluent cerebral white matter hypodensity elsewhere appears stable. No cortically based acute infarct identified. Vascular: Extensive Calcified atherosclerosis at the skull base. No suspicious intracranial vascular hyperdensity. Skull: No acute osseous abnormality identified. Sinuses/Orbits: Stable mild chronic sinus mucosal thickening. Tympanic cavities and mastoids remain clear. Other: No gaze deviation. No acute orbit or scalp soft tissue finding. ASPECTS [facility] Stroke Program Early CT Score) Total score (0-10 with 10 being normal): 10 (chronic encephalomalacia) IMPRESSION: 1. No acute cortically based infarct or acute intracranial hemorrhage identified. ASPECTS 10. 2. Small chronic posterior right MCA territory infarct and advanced chronic bilateral white matter disease. 3. These results were communicated to Dr. Derry Lory at 9:00 am on 01/06/2023 by text page via the [facility] messaging system. Electronically Signed   By: Odessa Fleming M.D.   On: 01/06/2023 09:01    Procedures Procedures    Medications Ordered in ED Medications  diphenhydrAMINE (BENADRYL) injection 50 mg (50 mg Intravenous Given 01/06/23 0920)  methylPREDNISolone sodium succinate (SOLU-MEDROL) 125 mg/2 mL injection 125 mg (250 mg Intravenous Given 01/06/23 0926)  iohexol (OMNIPAQUE) 350 MG/ML injection 75 mL (75 mLs Intravenous Contrast Given 01/06/23 0921)  lactated ringers bolus 1,000 mL (1,000 mLs Intravenous New Bag/Given 01/06/23 1020)    ED Course/ Medical Decision Making/ A&P                                 Medical Decision Making Amount and/or Complexity of Data Reviewed Labs: ordered. Radiology: ordered.  Risk Decision regarding hospitalization.   This patient presents to the ED for concern of left arm numbness, this involves an extensive number of treatment options, and is a complaint that carries with it a high risk of complications and morbidity.  The differential diagnosis includes CVA, ICH, seizure,  complex migraine, PAD, radiculopathy, neuropathy, metabolic derangements   Co morbidities that complicate the patient evaluation  DM, HTN, HLD, CAD, VTE, GERD   Additional history obtained:  Additional history obtained from N/A External records from outside source obtained and reviewed including EMR   Lab Tests:  I Ordered,  and personally interpreted labs.  The pertinent results include: Creatinine slightly increased from baseline, normal electrolytes, normal hemoglobin, no leukocytosis   Imaging Studies ordered:  I ordered imaging studies including CT head, CTA head and neck, MRI brain I independently visualized and interpreted imaging which showed no acute finding on CT imaging.  MRI pending at time of admission. I agree with the radiologist interpretation   Cardiac Monitoring: / EKG:  The patient was maintained on a cardiac monitor.  I personally viewed and interpreted the cardiac monitored which showed an underlying rhythm of: Sinus rhythm   Consultations Obtained:  I requested consultation with the neurologist, Dr. Derry Lory,  and discussed lab and imaging findings as well as pertinent plan - they recommend: Admission for further stroke workup   Problem List / ED Course / Critical interventions / Medication management  Patient presents for strokelike symptoms.  He felt some mild left arm numbness prior to going to bed last night.  He went to bed at 10 PM.  When he got this morning, he had worsened left arm numbness in addition to numbness to his left face.  On arrival in the ED, patient is awake and alert.  He has diminished sensation to left arm and left face.  He has mild facial asymmetry.  He has left arm and leg pronator drift.  Symptoms are concerning for LVO.  Code stroke was initiated.  Patient underwent CT imaging which did not show acute findings.  MRI was ordered.  Patient was admitted for further stroke workup. I ordered medication including IV fluids for  hydration Reevaluation of the patient after these medicines showed that the patient improved I have reviewed the patients home medicines and have made adjustments as needed   Social Determinants of Health:  Has PCP   CRITICAL CARE Performed by: Gloris Manchester   Total critical care time: 32 minutes  Critical care time was exclusive of separately billable procedures and treating other patients.  Critical care was necessary to treat or prevent imminent or life-threatening deterioration.  Critical care was time spent personally by me on the following activities: development of treatment plan with patient and/or surrogate as well as nursing, discussions with consultants, evaluation of patient's response to treatment, examination of patient, obtaining history from patient or surrogate, ordering and performing treatments and interventions, ordering and review of laboratory studies, ordering and review of radiographic studies, pulse oximetry and re-evaluation of patient's condition.         Final Clinical Impression(s) / ED Diagnoses Final diagnoses:  Stroke-like symptoms    Rx / DC Orders ED Discharge Orders     None         Gloris Manchester, MD 01/06/23 1050

## 2023-01-06 NOTE — ED Notes (Signed)
Verbal order given by Neuro NP Richardo Priest to this RN and RN Council Mechanic, administer 250mg  of Solu-Medrol prior to contrast for possible contrast allergy

## 2023-01-07 ENCOUNTER — Other Ambulatory Visit (HOSPITAL_COMMUNITY): Payer: Medicare HMO

## 2023-01-07 DIAGNOSIS — Z91041 Radiographic dye allergy status: Secondary | ICD-10-CM | POA: Diagnosis not present

## 2023-01-07 DIAGNOSIS — F1120 Opioid dependence, uncomplicated: Secondary | ICD-10-CM | POA: Diagnosis not present

## 2023-01-07 DIAGNOSIS — I6389 Other cerebral infarction: Secondary | ICD-10-CM | POA: Diagnosis not present

## 2023-01-07 DIAGNOSIS — E1151 Type 2 diabetes mellitus with diabetic peripheral angiopathy without gangrene: Secondary | ICD-10-CM | POA: Diagnosis not present

## 2023-01-07 DIAGNOSIS — R29703 NIHSS score 3: Secondary | ICD-10-CM | POA: Diagnosis not present

## 2023-01-07 DIAGNOSIS — R278 Other lack of coordination: Secondary | ICD-10-CM | POA: Diagnosis not present

## 2023-01-07 DIAGNOSIS — F0154 Vascular dementia, unspecified severity, with anxiety: Secondary | ICD-10-CM | POA: Diagnosis not present

## 2023-01-07 DIAGNOSIS — H5500 Unspecified nystagmus: Secondary | ICD-10-CM | POA: Diagnosis not present

## 2023-01-07 DIAGNOSIS — F05 Delirium due to known physiological condition: Secondary | ICD-10-CM | POA: Diagnosis not present

## 2023-01-07 DIAGNOSIS — E119 Type 2 diabetes mellitus without complications: Secondary | ICD-10-CM

## 2023-01-07 DIAGNOSIS — Z794 Long term (current) use of insulin: Secondary | ICD-10-CM | POA: Diagnosis not present

## 2023-01-07 DIAGNOSIS — R001 Bradycardia, unspecified: Secondary | ICD-10-CM | POA: Diagnosis not present

## 2023-01-07 DIAGNOSIS — G4700 Insomnia, unspecified: Secondary | ICD-10-CM | POA: Diagnosis not present

## 2023-01-07 DIAGNOSIS — I639 Cerebral infarction, unspecified: Secondary | ICD-10-CM | POA: Diagnosis not present

## 2023-01-07 DIAGNOSIS — F01511 Vascular dementia, unspecified severity, with agitation: Secondary | ICD-10-CM | POA: Diagnosis not present

## 2023-01-07 DIAGNOSIS — Z79899 Other long term (current) drug therapy: Secondary | ICD-10-CM | POA: Diagnosis not present

## 2023-01-07 DIAGNOSIS — I63511 Cerebral infarction due to unspecified occlusion or stenosis of right middle cerebral artery: Secondary | ICD-10-CM | POA: Diagnosis not present

## 2023-01-07 DIAGNOSIS — R2990 Unspecified symptoms and signs involving the nervous system: Secondary | ICD-10-CM | POA: Diagnosis not present

## 2023-01-07 DIAGNOSIS — G8194 Hemiplegia, unspecified affecting left nondominant side: Secondary | ICD-10-CM | POA: Diagnosis not present

## 2023-01-07 DIAGNOSIS — F01518 Vascular dementia, unspecified severity, with other behavioral disturbance: Secondary | ICD-10-CM | POA: Diagnosis not present

## 2023-01-07 DIAGNOSIS — L8990 Pressure ulcer of unspecified site, unspecified stage: Secondary | ICD-10-CM

## 2023-01-07 DIAGNOSIS — N400 Enlarged prostate without lower urinary tract symptoms: Secondary | ICD-10-CM | POA: Diagnosis not present

## 2023-01-07 DIAGNOSIS — Z951 Presence of aortocoronary bypass graft: Secondary | ICD-10-CM | POA: Diagnosis not present

## 2023-01-07 DIAGNOSIS — Z7984 Long term (current) use of oral hypoglycemic drugs: Secondary | ICD-10-CM | POA: Diagnosis not present

## 2023-01-07 DIAGNOSIS — E1165 Type 2 diabetes mellitus with hyperglycemia: Secondary | ICD-10-CM | POA: Diagnosis not present

## 2023-01-07 DIAGNOSIS — E7800 Pure hypercholesterolemia, unspecified: Secondary | ICD-10-CM | POA: Diagnosis not present

## 2023-01-07 DIAGNOSIS — I5042 Chronic combined systolic (congestive) and diastolic (congestive) heart failure: Secondary | ICD-10-CM | POA: Diagnosis not present

## 2023-01-07 DIAGNOSIS — Z888 Allergy status to other drugs, medicaments and biological substances status: Secondary | ICD-10-CM | POA: Diagnosis not present

## 2023-01-07 DIAGNOSIS — I110 Hypertensive heart disease with heart failure: Secondary | ICD-10-CM | POA: Diagnosis not present

## 2023-01-07 LAB — CBC
HCT: 39.8 % (ref 39.0–52.0)
Hemoglobin: 13.3 g/dL (ref 13.0–17.0)
MCH: 29.7 pg (ref 26.0–34.0)
MCHC: 33.4 g/dL (ref 30.0–36.0)
MCV: 88.8 fL (ref 80.0–100.0)
Platelets: 218 10*3/uL (ref 150–400)
RBC: 4.48 MIL/uL (ref 4.22–5.81)
RDW: 13.5 % (ref 11.5–15.5)
WBC: 16.3 10*3/uL — ABNORMAL HIGH (ref 4.0–10.5)
nRBC: 0 % (ref 0.0–0.2)

## 2023-01-07 LAB — BASIC METABOLIC PANEL
Anion gap: 9 (ref 5–15)
BUN: 31 mg/dL — ABNORMAL HIGH (ref 8–23)
CO2: 24 mmol/L (ref 22–32)
Calcium: 9.1 mg/dL (ref 8.9–10.3)
Chloride: 104 mmol/L (ref 98–111)
Creatinine, Ser: 1.39 mg/dL — ABNORMAL HIGH (ref 0.61–1.24)
GFR, Estimated: 51 mL/min — ABNORMAL LOW (ref >60)
Glucose, Bld: 244 mg/dL — ABNORMAL HIGH (ref 70–99)
Potassium: 4.4 mmol/L (ref 3.5–5.1)
Sodium: 137 mmol/L (ref 135–145)

## 2023-01-07 LAB — GLUCOSE, CAPILLARY
Glucose-Capillary: 197 mg/dL — ABNORMAL HIGH (ref 70–99)
Glucose-Capillary: 173 mg/dL — ABNORMAL HIGH (ref 70–99)
Glucose-Capillary: 168 mg/dL — ABNORMAL HIGH (ref 70–99)

## 2023-01-07 LAB — TSH: TSH: 0.706 u[IU]/mL (ref 0.350–4.500)

## 2023-01-07 MED ORDER — METOPROLOL SUCCINATE ER 25 MG PO TB24
25.0000 mg | ORAL_TABLET | Freq: Every day | ORAL | Status: DC
  Administered 2023-01-07 – 2023-01-08 (×2): 25 mg via ORAL
  Filled 2023-01-07 (×2): qty 1

## 2023-01-07 MED ORDER — MELATONIN 5 MG PO TABS
10.0000 mg | ORAL_TABLET | Freq: Every day | ORAL | Status: DC
  Administered 2023-01-07: 10 mg via ORAL
  Filled 2023-01-07: qty 2

## 2023-01-07 MED ORDER — QUETIAPINE FUMARATE 25 MG PO TABS
25.0000 mg | ORAL_TABLET | Freq: Once | ORAL | Status: AC
  Administered 2023-01-07: 25 mg via ORAL
  Filled 2023-01-07: qty 1

## 2023-01-07 MED ORDER — ATORVASTATIN CALCIUM 80 MG PO TABS
80.0000 mg | ORAL_TABLET | Freq: Every day | ORAL | Status: DC
  Administered 2023-01-08: 80 mg via ORAL
  Filled 2023-01-07: qty 1

## 2023-01-07 MED ORDER — INSULIN GLARGINE-YFGN 100 UNIT/ML ~~LOC~~ SOLN
12.0000 [IU] | Freq: Every day | SUBCUTANEOUS | Status: DC
  Administered 2023-01-07 – 2023-01-08 (×2): 12 [IU] via SUBCUTANEOUS
  Filled 2023-01-07 (×2): qty 0.12

## 2023-01-07 NOTE — Progress Notes (Signed)
Pt moved from room 12 to 18 by this RN.  All belongings transferred to new room, including hat, clothes, slippers, cell phone, cord, and charger base.  Pt sitting up in the chair at this time.  Chair alarm on.  Video monitoring set up for pt's safety.  All belongings and call bell within reach.  WCTM

## 2023-01-07 NOTE — Evaluation (Signed)
Occupational Therapy Evaluation Patient Details Name: Ricardo Le MRN: [id number] DOB: 1940-07-29 Today's Date: 01/07/2023   History of Present Illness 82 yo M comes to ED 01/06/23 with L hand/arm weakness. Head CT-Small chronic posterior right MCA territory infarct and advanced  chronic bilateral white matter disease. He had CT angio which showed critical stenosis of B vertebral arteries.   PMH- DM, HTN, CAD (CABG?),   Clinical Impression   PTA, pt lived with spouse and daughter and reports that he has had a significant decline over the past 2 weeks. Upon eval, pt confused, disoriented with max cues to reorient and little carryover. Presenting with decreased safety, L: sided awareness (hitting L foot on RW continuously), decreased balance, coordination, and strength. Pt needing CGA-mod A for ADL at time of eval. Will continue to follow acutely. Due to significant change in functional status and good family support, recommending intensive multidisciplinary rehabilitation >3 hours/day to optimize safety and independence in ADL.        If plan is discharge home, recommend the following: A little help with walking and/or transfers;A little help with bathing/dressing/bathroom;A lot of help with bathing/dressing/bathroom;Assistance with cooking/housework;Direct supervision/assist for medications management;Direct supervision/assist for financial management;Assist for transportation;Help with stairs or ramp for entrance;Supervision due to cognitive status    Functional Status Assessment  Patient has had a recent decline in their functional status and demonstrates the ability to make significant improvements in function in a reasonable and predictable amount of time.  Equipment Recommendations  Other (comment) (defer)    Recommendations for Other Services Rehab consult;Speech consult     Precautions / Restrictions Precautions Precautions: Fall Restrictions Weight Bearing Restrictions: No       Mobility Bed Mobility               General bed mobility comments: up in recliner    Transfers Overall transfer level: Needs assistance Equipment used: Rolling walker (2 wheels) Transfers: Sit to/from Stand Sit to Stand: Contact guard assist           General transfer comment: max verbal cues for sequencing; repeated x 3 (bed and chairJ)      Balance Overall balance assessment: Needs assistance Sitting-balance support: No upper extremity supported, Feet supported Sitting balance-Leahy Scale: Good     Standing balance support: Bilateral upper extremity supported, Reliant on assistive device for balance Standing balance-Leahy Scale: Poor                             ADL either performed or assessed with clinical judgement   ADL Overall ADL's : Needs assistance/impaired     Grooming: Oral care;Minimal assistance;Standing;Cueing for safety   Upper Body Bathing: Set up;Sitting   Lower Body Bathing: Moderate assistance;Sit to/from stand   Upper Body Dressing : Minimal assistance;Sitting   Lower Body Dressing: Moderate assistance;Sit to/from stand   Toilet Transfer: Minimal assistance;Ambulation;Rolling walker (2 wheels)   Toileting- Clothing Manipulation and Hygiene: Contact guard assist Toileting - Clothing Manipulation Details (indicate cue type and reason): to urinate in toilet while standing     Functional mobility during ADLs: Minimal assistance;Rolling walker (2 wheels)       Vision Patient Visual Report: Other (comment) (Pt reports unsure) Vision Assessment?: Vision impaired- to be further tested in functional context Additional Comments: Pt with max difficulty participating functionally in visual fields assessment and with inaccurate reports of how many fingers therapist is holding up without compensatory gaze shift or head turn.  Did easily locate items at sink.     Perception Perception:  (decreased L sided attention)        Praxis         Pertinent Vitals/Pain Pain Assessment Pain Assessment: Faces Faces Pain Scale: Hurts little more Pain Location: all over Pain Descriptors / Indicators: Aching Pain Intervention(s): Limited activity within patient's tolerance, Monitored during session     Extremity/Trunk Assessment Upper Extremity Assessment Upper Extremity Assessment: LUE deficits/detail LUE Deficits / Details: 4-/5 strength, poor coordination and dexterity LUE Sensation: decreased light touch;decreased proprioception LUE Coordination: decreased fine motor;decreased gross motor   Lower Extremity Assessment Lower Extremity Assessment: Defer to PT evaluation LLE Deficits / Details: ankle DF 4/5 LLE Sensation: decreased light touch LLE Coordination: WNL   Cervical / Trunk Assessment Cervical / Trunk Assessment: Kyphotic   Communication Communication Communication: Hearing impairment Cueing Techniques: Verbal cues;Gestural cues;Tactile cues   Cognition Arousal: Alert Behavior During Therapy: Anxious, Impulsive Overall Cognitive Status: No family/caregiver present to determine baseline cognitive functioning                                 General Comments: oriented to self, situation (stroke), not time or place ([place]--even after told [place] multiple times). Decreased attention to L side of body/environment. Needing cues for safety throughout with noted poor carryover     General Comments  VSS per telemetry; +dyspnea when ambulated and required seated rest prior to ADL activities at sink    Exercises     Shoulder Instructions      Home Living Family/patient expects to be discharged to:: Private residence Living Arrangements: Spouse/significant other;Children   Type of Home: House Home Access: Level entry (has 2 homes? 2-3 steps bil rails)     Home Layout: Able to live on main level with bedroom/bathroom (sleeps in recliner)     Bathroom Shower/Tub: Sponge  bathes at baseline         Home Equipment: Agricultural consultant (2 wheels);Cane - single point;BSC/3in1;Shower seat - built in;Wheelchair - manual   Additional Comments: information per pt; ?reliability      Prior Functioning/Environment Prior Level of Function : Needs assist             Mobility Comments: walks with cane vs RW; recently has required assist ADLs Comments: Pt reports he has been sleeping in a recliner, has not been getting into a shower, and has been receiving assist with mobility        OT Problem List: Decreased strength;Decreased activity tolerance;Impaired balance (sitting and/or standing);Impaired vision/perception;Decreased coordination;Decreased cognition;Decreased safety awareness;Decreased knowledge of use of DME or AE;Impaired UE functional use      OT Treatment/Interventions: Self-care/ADL training;Therapeutic exercise;DME and/or AE instruction;Balance training;Patient/family education;Therapeutic activities;Visual/perceptual remediation/compensation;Cognitive remediation/compensation    OT Goals(Current goals can be found in the care plan section) Acute Rehab OT Goals Patient Stated Goal: unable OT Goal Formulation: With patient Time For Goal Achievement: 01/21/23 Potential to Achieve Goals: Good  OT Frequency: Min 1X/week    Co-evaluation PT/OT/SLP Co-Evaluation/Treatment: Yes Reason for Co-Treatment: For patient/therapist safety;Necessary to address cognition/behavior during functional activity PT goals addressed during session: Mobility/safety with mobility;Balance;Proper use of DME OT goals addressed during session: ADL's and self-care      AM-PAC OT "6 Clicks" Daily Activity     Outcome Measure Help from another person eating meals?: A Little Help from another person taking care of personal grooming?: A Little Help from another  person toileting, which includes using toliet, bedpan, or urinal?: A Little Help from another person bathing  (including washing, rinsing, drying)?: A Little Help from another person to put on and taking off regular upper body clothing?: A Little Help from another person to put on and taking off regular lower body clothing?: A Lot 6 Click Score: 17   End of Session Equipment Utilized During Treatment: Gait belt;Rolling walker (2 wheels) Nurse Communication: Mobility status  Activity Tolerance: Patient tolerated treatment well Patient left: in chair;with call bell/phone within reach;with chair alarm set;Other (comment) (tele sitter)  OT Visit Diagnosis: Unsteadiness on feet (R26.81);Muscle weakness (generalized) (M62.81);Other symptoms and signs involving cognitive function;Low vision, both eyes (H54.2)                Time: 4132-4401 OT Time Calculation (min): 31 min Charges:  OT General Charges $OT Visit: 1 Visit OT Evaluation $OT Eval Moderate Complexity: 1 Mod OT Treatments $Self Care/Home Management : 8-22 mins  Tyler Deis, OTR/L [facility] Acute Rehabilitation Office: [phone]   Myrla Halsted 01/07/2023, 1:00 PM

## 2023-01-07 NOTE — Progress Notes (Addendum)
STROKE TEAM PROGRESS NOTE   BRIEF HPI [name] is a 82 y.o. male with a history IVH, falls, HTN, DM, CAD, PAD, hard of hearing who presented to the ED this am d/t left arm numbness that started sometime in the middle of night. Patient has a history of falls, family states that patient is not always honest about falling at home. Wife called EMS due to his complaints and she was afraid that he had fallen during the night and wasn't telling her.   On exam in CT, patient has inconsistent visual field findings (unsure if this is from confusion or from visual deficits), decreased sensation in left face, arm and leg, slight left leg weakness. Due to the inconsistent visual findings, a CT Angio is warranted. Patient has allergy to contrast noted, was given premedication. CT and CTA negative.    LKW: 2200 11/22 Modified rankin score: 1-2 IV Thrombolysis: No (hx of IVH) EVT: No (No LVO) NIHSS: 3   NIHSS:  1a Level of Conscious.: 0 1b LOC Questions: 0 1c LOC Commands: 0 2 Best Gaze: 0 3 Visual: 0 4 Facial Palsy: 0 5a Motor Arm - left: 0 5b Motor Arm - Right:0  6a Motor Leg - Left: 1 6b Motor Leg - Right: 0 7 Limb Ataxia: 0 8 Sensory: 2 9 Best Language: 0 10 Dysarthria: 0 11 Extinct. and Inatten.: 0 TOTAL: 3   SIGNIFICANT HOSPITAL EVENTS  INTERIM HISTORY/SUBJECTIVE Patient reports left arm numbness and leg weakness prior to admission Symptoms are improving, however still mild left sided weakness and numbness. Prior history of IVH due to following off a ladder ~5-6 years ago. Hit head against the concrete cinder block, denies any residual effects afterwards. Wife helps manage medicines at home and he states he has issues with memory at baseline. Patient continued to ask about discharge, stating he had some things to handle with social security and meeting with his wife.   OBJECTIVE  CBC    Component Value Date/Time   WBC 16.3 (H) 01/07/2023 0434   RBC 4.48 01/07/2023 0434    HGB 13.3 01/07/2023 0434   HGB 12.6 (L) 06/01/2021 1152   HCT 39.8 01/07/2023 0434   HCT 37.4 (L) 06/01/2021 1152   PLT 218 01/07/2023 0434   PLT 264 06/01/2021 1152   MCV 88.8 01/07/2023 0434   MCV 95 06/01/2021 1152   MCH 29.7 01/07/2023 0434   MCHC 33.4 01/07/2023 0434   RDW 13.5 01/07/2023 0434   RDW 13.2 06/01/2021 1152   LYMPHSABS 2.7 01/06/2023 0840   LYMPHSABS 1.9 12/18/2018 0846   MONOABS 1.4 (H) 01/06/2023 0840   EOSABS 0.1 01/06/2023 0840   EOSABS 0.1 12/18/2018 0846   BASOSABS 0.1 01/06/2023 0840   BASOSABS 0.1 12/18/2018 0846    BMET    Component Value Date/Time   NA 137 01/07/2023 0434   NA 140 11/25/2020 1536   K 4.4 01/07/2023 0434   CL 104 01/07/2023 0434   CO2 24 01/07/2023 0434   GLUCOSE 244 (H) 01/07/2023 0434   BUN 31 (H) 01/07/2023 0434   BUN 20 11/25/2020 1536   CREATININE 1.39 (H) 01/07/2023 0434   CALCIUM 9.1 01/07/2023 0434   EGFR 71 11/25/2020 1536   GFRNONAA 51 (L) 01/07/2023 0434    IMAGING past 24 hours MR BRAIN WO CONTRAST  Result Date: 01/06/2023 CLINICAL DATA:  Neuro deficit, acute, stroke suspected. Confusion and memory disturbance. EXAM: MRI HEAD WITHOUT CONTRAST TECHNIQUE: Multiplanar, multiecho pulse sequences of the  brain and surrounding structures were obtained without intravenous contrast. COMPARISON:  CT studies earlier same day. MRI 05/29/2014 FINDINGS: Brain: Diffusion imaging shows an acute subcentimeter infarction of the right lateral thalamus. Chronic small-vessel ischemic changes affect the pons. No focal cerebellar insult. Cerebral hemispheres otherwise show moderate chronic small-vessel ischemic changes throughout the white matter. Old right parietal vertex cortical and subcortical infarction. No mass lesion, hemorrhage, hydrocephalus or extra-axial collection. Vascular: Major vessels at the base of the brain show flow. Skull and upper cervical spine: Negative Sinuses/Orbits: Clear/normal Other: None IMPRESSION: 1. Acute  subcentimeter infarction of the right lateral thalamus. 2. Chronic small-vessel ischemic changes of the pons and cerebral hemispheric white matter. Old right parietal vertex cortical and subcortical infarction. Electronically Signed   By: Paulina Fusi M.D.   On: 01/06/2023 15:39    Vitals:   01/07/23 0008 01/07/23 0438 01/07/23 0700 01/07/23 0722  BP: 130/60 136/62  136/63  Pulse: 60 61  (!) 54  Resp: 19 18  (!) 21  Temp: 97.7 F (36.5 C) 97.6 F (36.4 C)  98 F (36.7 C)  TempSrc: Oral Oral  Oral  SpO2: 94% 96%  95%  Weight:   70 kg   Height:       PHYSICAL EXAM General:  Alert, well-nourished, well-developed patient in no acute distress Psych:  Mood and affect appropriate for situation CV: Regular rate and rhythm on monitor Respiratory:  Regular, unlabored respirations on room air GI: Abdomen soft and nontender  NEURO:  Mental Status: AA&Oriented to month, person, place and situation. Patient is unable to give clear and coherent history. Continues to ask about discharge today and has to handle some things at home. Speech/Language: speech is without dysarthria or aphasia.  Cranial Nerves:  II: PERRL. Visual fields full.  III, IV, VI: EOMI. Eyelids elevate symmetrically.  V: Sensation decreased on left side of face.  VII: Face is symmetrical resting and smiling VIII: hearing intact to voice. Hard of hearing.  IX, X: Palate elevates symmetrically. Phonation is normal.  [id number] shrug 5/5. XII: tongue is midline without fasciculations. Motor: 5/5 strength with some mild drift on left upper and lower extremities.   Tone: is normal and bulk is normal Sensation- Intact to light touch bilaterally. Extinction absent to light touch to DSS.   Coordination: FTN intact bilaterally, w/ mild drift on LUE HKS: Mild ataxia in LLE.Drift on left Gait- deferred  Most Recent NIH 3   ASSESSMENT/PLAN  Stroke: small acute infarction of the right lateral thalamus Etiology:  small vessel  disease  Code Stroke No acute abnormalities. Small chronic posterior right MCA territory infarct. ASPECTS 10. CTA head & neck No LVO. Severe arthrosclerosis in head and neck, Critical stenoses BOTH Vertebral Arteries (proximal right V1, and left V4 which is sole supply to the Basilar). Severe stenosis in R ICA siphon. Moderate stenosis in R PCA P1. Mild-moderate stenosis Left ICA siphon. B/l ICA bulb atherosclerosis MRI  Acute subcentimeter infarction of the right lateral thalamus. Old right parietal vertex cortical and subcortical infarction. 2D Echo pending  LDL 82 HgbA1c 8.1 UDS negative  VTE prophylaxis - Xeralto 10mg  daily aspirin 81 mg daily and clopidogrel 75 mg daily prior to admission, and will continue. Therapy recommendations:  CIR Disposition:  pending   Hx of traumatic IVH/SAH 07/2018 small amount IVH and SAH following falls No residue deficit  Hypertension Home meds:  lisinopril, metoprolol  Stable Restarted home metoprolol   Hyperlipidemia Home meds:  Lipitor 40 mg daily  LDL 82, goal < 70 Increased to Lipitor 80 mg   Continue statin at discharge  Diabetes type II Uncontrolled Home meds: Jardiance, Amaryl  HgbA1c 8.1, goal < 7.0 Continue Semglee 12 units  CBGs SSI Recommend close follow-up with PCP for better DM control  Other Stroke Risk Factors ETOH use, alcohol level <10, advised to drink no more than 2 drink(s) a day CAD on DAPT PVD on DAPT  Hx of PE and DVT in 10/2018 - treated with eliquis  Other Active Problems  BPH: Continue flomax  Concern for dementia vs delirium: Patient reports difficulty with memory at baseline, concern for dementia per wife, delirium precautions and sitter ordered per primary   Hospital day # 1  Neurology will sign off. Please call with questions. Pt will follow up with stroke clinic NP at [facility] in about 4 weeks. Thanks for the consult.   Marvel Plan, MD PhD Stroke Neurology 01/07/2023 4:56 PM    To contact Stroke  Continuity provider, please refer to [email or web address]. After hours, contact General Neurology

## 2023-01-07 NOTE — Evaluation (Signed)
Physical Therapy Evaluation Patient Details Name: Ricardo Le REASONS MRN: [id number] DOB: April 14, 1940 Today's Date: 01/07/2023  History of Present Illness  82 yo M comes to ED 01/06/23 with L hand/arm weakness. Head CT-Small chronic posterior right MCA territory infarct and advanced  chronic bilateral white matter disease. He had CT angio which showed critical stenosis of B vertebral arteries.   PMH- DM, HTN, CAD (CABG?),  Clinical Impression   Pt admitted secondary to problem above with deficits below. PTA patient was living with spouse and daughter in multi-level home where he was able to stay on the ground level.  Pt currently requires min assist for transfers and gait with RW. Requires max cues for sequencing and proper use of RW. Pt drifts to his left (inside RW closer to left handle) with nearly tripping himself with left foot hitting leg of RW. Patient with decr awareness and required cues to correct.  Anticipate patient will benefit from PT to address problems listed below.Will continue to follow acutely to maximize functional mobility independence and safety.  Patient can benefit from post-acute rehab >3 hrs therapy per day.          If plan is discharge home, recommend the following: A little help with walking and/or transfers;A lot of help with bathing/dressing/bathroom;Assistance with cooking/housework;Direct supervision/assist for medications management;Direct supervision/assist for financial management;Assist for transportation;Help with stairs or ramp for entrance;Supervision due to cognitive status   Can travel by private vehicle        Equipment Recommendations None recommended by PT  Recommendations for Other Services  Rehab consult    Functional Status Assessment Patient has had a recent decline in their functional status and demonstrates the ability to make significant improvements in function in a reasonable and predictable amount of time.     Precautions / Restrictions  Restrictions Weight Bearing Restrictions: No      Mobility  Bed Mobility               General bed mobility comments: up in recliner    Transfers Overall transfer level: Needs assistance Equipment used: Rolling walker (2 wheels) Transfers: Sit to/from Stand Sit to Stand: Contact guard assist           General transfer comment: max verbal cues for sequencing; repeated x 3 (bed and chairJ)    Ambulation/Gait Ambulation/Gait assistance: Min assist Gait Distance (Feet): 140 Feet Assistive device: Rolling walker (2 wheels) Gait Pattern/deviations: Step-through pattern, Decreased stride length, Decreased weight shift to right, Shuffle, Drifts right/left, Trunk flexed   Gait velocity interpretation: 1.31 - 2.62 ft/sec, indicative of limited community ambulator   General Gait Details: walks to left side of RW with occasionally hitting left foot against left leg of RW; vc for proximity (required repeatedly as he would correct and then revert to pushing RW too far ahead).  Stairs            Wheelchair Mobility     Tilt Bed    Modified Rankin (Stroke Patients Only) Modified Rankin (Stroke Patients Only) Pre-Morbid Rankin Score: Moderately severe disability Modified Rankin: Moderately severe disability     Balance Overall balance assessment: Needs assistance Sitting-balance support: No upper extremity supported, Feet supported Sitting balance-Leahy Scale: Good     Standing balance support: Bilateral upper extremity supported, Reliant on assistive device for balance Standing balance-Leahy Scale: Poor  Pertinent Vitals/Pain Pain Assessment Pain Assessment: Faces Faces Pain Scale: Hurts little more Pain Location: all over Pain Descriptors / Indicators: Aching Pain Intervention(s): Limited activity within patient's tolerance, Monitored during session, Repositioned    Home Living Family/patient expects to be  discharged to:: Private residence Living Arrangements: Spouse/significant other;Children   Type of Home: House Home Access: Level entry (has 2 homes? 2-3 steps bil rails)       Home Layout: Able to live on main level with bedroom/bathroom (sleeps in recliner) Home Equipment: Rolling Walker (2 wheels);Cane - single point;BSC/3in1;Shower seat - built in;Wheelchair - manual Additional Comments: information per pt; ?reliability    Prior Function Prior Level of Function : Needs assist             Mobility Comments: walks with cane vs RW; recently has required assist ADLs Comments: Pt reports he has been sleeping in a recliner, has not been getting into a shower, and has been receiving assist with mobility     Extremity/Trunk Assessment   Upper Extremity Assessment Upper Extremity Assessment: Defer to OT evaluation    Lower Extremity Assessment Lower Extremity Assessment: LLE deficits/detail;Difficult to assess due to impaired cognition LLE Deficits / Details: ankle DF 4/5 LLE Sensation: decreased light touch LLE Coordination: WNL    Cervical / Trunk Assessment Cervical / Trunk Assessment: Kyphotic  Communication   Communication Communication: Hearing impairment Cueing Techniques: Verbal cues;Gestural cues;Tactile cues  Cognition Arousal: Alert Behavior During Therapy: Anxious, Impulsive Overall Cognitive Status: No family/caregiver present to determine baseline cognitive functioning                                 General Comments: oriented to self, situation (stroke), not time or place ([place]--even after told [facility] multiple times)        General Comments General comments (skin integrity, edema, etc.): VSS per telemetry; +dyspnea when ambulated and required seated rest prior to ADL activities at sink    Exercises     Assessment/Plan    PT Assessment Patient needs continued PT services  PT Problem List Decreased strength;Decreased activity  tolerance;Decreased balance;Decreased mobility;Decreased cognition;Decreased knowledge of use of DME;Decreased safety awareness;Decreased knowledge of precautions;Cardiopulmonary status limiting activity;Impaired sensation;Pain       PT Treatment Interventions DME instruction;Gait training;Stair training;Functional mobility training;Therapeutic activities;Therapeutic exercise;Balance training;Neuromuscular re-education;Cognitive remediation;Patient/family education    PT Goals (Current goals can be found in the Care Plan section)  Acute Rehab PT Goals Patient Stated Goal: go to see his heart doctor in [place] PT Goal Formulation: Patient unable to participate in goal setting Time For Goal Achievement: 01/21/23 Potential to Achieve Goals: Good    Frequency Min 1X/week     Co-evaluation PT/OT/SLP Co-Evaluation/Treatment: Yes Reason for Co-Treatment: For patient/therapist safety;Necessary to address cognition/behavior during functional activity PT goals addressed during session: Mobility/safety with mobility;Balance;Proper use of DME         AM-PAC PT "6 Clicks" Mobility  Outcome Measure Help needed turning from your back to your side while in a flat bed without using bedrails?: A Little Help needed moving from lying on your back to sitting on the side of a flat bed without using bedrails?: A Little Help needed moving to and from a bed to a chair (including a wheelchair)?: A Little Help needed standing up from a chair using your arms (e.g., wheelchair or bedside chair)?: A Little Help needed to walk in hospital room?: A Little Help needed  climbing 3-5 steps with a railing? : A Lot 6 Click Score: 17    End of Session Equipment Utilized During Treatment: Gait belt Activity Tolerance: Patient limited by fatigue Patient left: in chair;with call bell/phone within reach;with chair alarm set;Other (comment) (tele-sitter) Nurse Communication: Mobility status PT Visit Diagnosis: Other  abnormalities of gait and mobility (R26.89);Muscle weakness (generalized) (M62.81);Other symptoms and signs involving the nervous system (R29.898)    Time: 1610-9604 PT Time Calculation (min) (ACUTE ONLY): 32 min   Charges:   PT Evaluation $PT Eval Low Complexity: 1 Low   PT General Charges $$ ACUTE PT VISIT: 1 Visit          Jerolyn Center, PT Acute Rehabilitation Services  Office [phone]   Zena Amos 01/07/2023, 10:20 AM

## 2023-01-07 NOTE — Progress Notes (Signed)
HD#0 Subjective:   Summary: This is an 82 year old male with a past medical history of type 2 diabetes, hypertension, hyperlipidemia who presented to the emergency department with concerns of left upper extremity and left lower extremity weakness and found to have new acute right lateral thalamus infarct.  Overnight Events: Overnight, patient was confused and agitated in which patient needed constant redirection and was ultimately given Seroquel.  Patient is evaluated at bedside this morning.  He states he is doing well.  He states his weakness has improved, but still has some left upper extremity numbness.  He has no other concerns this morning.  Objective:  Vital signs in last 24 hours: Vitals:   01/06/23 1636 01/06/23 1926 01/07/23 0008 01/07/23 0438  BP: (!) 140/79 121/64 130/60 136/62  Pulse: 79 76 60 61  Resp: (!) 22 19 19 18   Temp:  97.6 F (36.4 C) 97.7 F (36.5 C) 97.6 F (36.4 C)  TempSrc:  Oral Oral Oral  SpO2: 94% 96% 94% 96%  Weight:      Height:       Supplemental O2: Room Air SpO2: 96 %   Physical Exam:  Constitutional: Resting in bed, no acute distress Cardiovascular: Regular rate and rhythm, no murmurs, rubs, or gallops Pulmonary/Chest: Clear to auscultation bilaterally, no wheezes, rhonchi or rales Neurological: Alert and oriented x 3, 5/5 strength of bilateral upper lower extremities, sensation intact, cranial nerves II through XII intact  [employer]   01/06/23 0810  Weight: 79.4 kg     Intake/Output Summary (Last 24 hours) at 01/07/2023 0627 Last data filed at 01/07/2023 0410 Gross per 24 hour  Intake 480 ml  Output 1100 ml  Net -620 ml   Net IO Since Admission: -620 mL [01/07/23 0627]  Pertinent Labs:    Latest Ref Rng & Units 01/07/2023    4:34 AM 01/06/2023    8:48 AM 01/06/2023    8:40 AM  CBC  WBC 4.0 - 10.5 K/uL 16.3   10.3   Hemoglobin 13.0 - 17.0 g/dL 78.2  95.6  21.3   Hematocrit 39.0 - 52.0 % 39.8  48.0  46.6    Platelets 150 - 400 K/uL 218   228        Latest Ref Rng & Units 01/07/2023    4:34 AM 01/06/2023    8:48 AM 01/06/2023    8:40 AM  CMP  Glucose 70 - 99 mg/dL 086  578  469   BUN 8 - 23 mg/dL 31  30  25    Creatinine 0.61 - 1.24 mg/dL 6.29  5.28  4.13   Sodium 135 - 145 mmol/L 137  141  139   Potassium 3.5 - 5.1 mmol/L 4.4  4.2  4.1   Chloride 98 - 111 mmol/L 104  101  102   CO2 22 - 32 mmol/L 24   27   Calcium 8.9 - 10.3 mg/dL 9.1   9.7   Total Protein 6.5 - 8.1 g/dL   8.4   Total Bilirubin <1.2 mg/dL   1.1   Alkaline Phos 38 - 126 U/L   66   AST 15 - 41 U/L   24   ALT 0 - 44 U/L   27     Imaging: MR BRAIN WO CONTRAST  Result Date: 01/06/2023 CLINICAL DATA:  Neuro deficit, acute, stroke suspected. Confusion and memory disturbance. EXAM: MRI HEAD WITHOUT CONTRAST TECHNIQUE: Multiplanar, multiecho pulse sequences of the brain and surrounding structures were obtained  without intravenous contrast. COMPARISON:  CT studies earlier same day. MRI 05/29/2014 FINDINGS: Brain: Diffusion imaging shows an acute subcentimeter infarction of the right lateral thalamus. Chronic small-vessel ischemic changes affect the pons. No focal cerebellar insult. Cerebral hemispheres otherwise show moderate chronic small-vessel ischemic changes throughout the white matter. Old right parietal vertex cortical and subcortical infarction. No mass lesion, hemorrhage, hydrocephalus or extra-axial collection. Vascular: Major vessels at the base of the brain show flow. Skull and upper cervical spine: Negative Sinuses/Orbits: Clear/normal Other: None IMPRESSION: 1. Acute subcentimeter infarction of the right lateral thalamus. 2. Chronic small-vessel ischemic changes of the pons and cerebral hemispheric white matter. Old right parietal vertex cortical and subcortical infarction. Electronically Signed   By: Paulina Fusi M.D.   On: 01/06/2023 15:39   CT ANGIO HEAD NECK W WO CM (CODE STROKE)  Result Date:  01/06/2023 CLINICAL DATA:  82 year old male code stroke presentation. Left side deficits. EXAM: CT ANGIOGRAPHY HEAD AND NECK WITH AND WITHOUT CONTRAST TECHNIQUE: Multidetector CT imaging of the head and neck was performed using the standard protocol during bolus administration of intravenous contrast. Multiplanar CT image reconstructions and MIPs were obtained to evaluate the vascular anatomy. Carotid stenosis measurements (when applicable) are obtained utilizing NASCET criteria, using the distal internal carotid diameter as the denominator. RADIATION DOSE REDUCTION: This exam was performed according to the departmental dose-optimization program which includes automated exposure control, adjustment of the mA and/or kV according to patient size and/or use of iterative reconstruction technique. CONTRAST:  75mL OMNIPAQUE IOHEXOL 350 MG/ML SOLN COMPARISON:  Plain head CT today 0856 hours FINDINGS: CTA NECK Skeleton: Previous cervical ACDF and widespread cervical spine ankylosis. Sternotomy. No acute osseous abnormality identified. Upper chest: Prior CABG. Other neck: Negative for age. Aortic arch: Calcified aortic atherosclerosis. 4 vessel arch configuration. Right carotid system: Plaque without significant stenosis before the bifurcation. Bulky calcified more so than soft plaque at the right ICA origin and bulb. Subsequent up to 50 % stenosis with respect to the distal vessel. Left carotid system: Atherosclerosis but no stenosis before the bifurcation. Bulky calcified plaque throughout the bifurcation, proximal left ICA to the bulb. Stenosis up to 50 % with respect to the distal vessel. Additional soft and calcified plaque as the left ICA enters the skull on series 8, image 72, only mild stenosis. Vertebral arteries: Proximal right subclavian artery plaque without stenosis. Bulky calcified plaque at the right vertebral artery origin with severe stenosis (series 6, and tandem critical stenosis in the right V1 segment  on series 9, image 46, short segment occlusion is possible but the vessel reconstitutes. Right vertebral enhancement in the neck is slightly decreased compared to that on the left side. There is additional V2 and V3 plaque but no other high-grade stenosis before the posterior fossa. Left vertebral arises directly from the arch with no stenosis. Mildly dominant appearing left vertebral is patent to the skull base with no significant plaque or stenosis. CTA HEAD Posterior circulation: Severe stenosis and near occlusion right vertebral V4 segment beginning on series 6, image 160. But there is faint enhancement to the right PICA. Contralateral left vertebral artery supplies the basilar although with severe calcified plaque and string sign stenosis in the proximal V4 segment series 6, image 157. Distal left V4 is patent but smaller. Left PICA remains patent. Basilar artery is patent without stenosis. SCA and PCA origins are patent with fetal type left PCA. Moderate stenosis right PCA P1 segment (series 12, image 22). And mild to moderate irregularity and  stenosis at the left PCA P2/P3 junction on series 13, image 26. No PCA branch occlusion. Anterior circulation: Both ICA siphons are heavily calcified but remain patent. Mild to moderate left siphon stenosis. Normal left posterior communicating artery. Moderate to severe right siphon stenosis in the cavernous and supraclinoid segments. See series 6, image 111 and series 10, image 108. Patent carotid termini, MCA and ACA origins appear normal. Anterior communicating artery is normal. Bilateral ACA branches are within normal limits. Left MCA M1 segment and trifurcation are patent without stenosis. Left MCA branches are patent with mild irregularity. Right MCA M1 segment is patent with mild irregularity. No M1 stenosis. Patent right MCA bifurcation. Patent right MCA branches with mild irregularity of the dominant posterior M2. No right MCA branch occlusion is identified.  Venous sinuses: Early contrast timing, grossly patent. Anatomic variants: Affectively dominant left vertebral artery which supplies the basilar arises directly from the arch. Fetal type left PCA origin. Review of the MIP images confirms the above findings IMPRESSION: 1. Negative for emergent large vessel occlusion, but positive for severe atherosclerosis in the head and neck, most notable for: - Critical stenoses BOTH Vertebral Arteries (proximal right vertebral, and distal left vertebral which is sole supply to the Basilar). - Severe stenosis Right ICA siphon. - Moderate stenosis Right PCA P1. - Mild to moderate stenosis left ICA siphon. 2.  Aortic Atherosclerosis (ICD10-I70.0).  Prior CABG. 3. Postoperative and degenerative cervical spinal ankylosis. Electronically Signed   By: Odessa Fleming M.D.   On: 01/06/2023 09:42   CT HEAD CODE STROKE WO CONTRAST  Result Date: 01/06/2023 CLINICAL DATA:  Code stroke. 82 year old male with left side deficit. EXAM: CT HEAD WITHOUT CONTRAST TECHNIQUE: Contiguous axial images were obtained from the base of the skull through the vertex without intravenous contrast. RADIATION DOSE REDUCTION: This exam was performed according to the departmental dose-optimization program which includes automated exposure control, adjustment of the mA and/or kV according to patient size and/or use of iterative reconstruction technique. COMPARISON:  Brain MRI 05/29/2014.  Head CT 07/27/2021. FINDINGS: Brain: Stable cerebral volume. No midline shift, mass effect, or evidence of intracranial mass lesion. No acute intracranial hemorrhage identified. Stable ventricle size and configuration. Small area of chronic cortical encephalomalacia right perirolandic region series 2, image 26 is stable from last year, new from the previous MRI. Bilateral Patchy and confluent cerebral white matter hypodensity elsewhere appears stable. No cortically based acute infarct identified. Vascular: Extensive Calcified  atherosclerosis at the skull base. No suspicious intracranial vascular hyperdensity. Skull: No acute osseous abnormality identified. Sinuses/Orbits: Stable mild chronic sinus mucosal thickening. Tympanic cavities and mastoids remain clear. Other: No gaze deviation. No acute orbit or scalp soft tissue finding. ASPECTS [facility] Stroke Program Early CT Score) Total score (0-10 with 10 being normal): 10 (chronic encephalomalacia) IMPRESSION: 1. No acute cortically based infarct or acute intracranial hemorrhage identified. ASPECTS 10. 2. Small chronic posterior right MCA territory infarct and advanced chronic bilateral white matter disease. 3. These results were communicated to Dr. Derry Lory at 9:00 am on 01/06/2023 by text page via the [facility] messaging system. Electronically Signed   By: Odessa Fleming M.D.   On: 01/06/2023 09:01    Assessment/Plan:   Principal Problem:   Acute CVA (cerebrovascular accident) [facility]) Active Problems:   Type 2 diabetes mellitus with vascular disease (HCC)   Elevated lipids   Essential hypertension   Heart failure with recovered ejection fraction (HFrecEF) [facility])   Patient Summary: Ricardo Le is a 82 y.o. with  a pertinent PMH of type 2 diabetes, hypertension, hyperlipidemia who presents with concerns of left upper and lower extremity weakness.  Patient admitted for further evaluation and management of acute right lateral thalamus stroke.  #Acute right lateral thalamus infarct #History of prior CVA Patient evaluated at bedside this morning.  Patient is doing well.  Weakness is improving.  Sensation is improving.  No acute concern for worsening of neurological status.  Neurology is following. -Continue aspirin 81 mg daily -Continue Plavix 75 mg daily -Neurology following, appreciate recommendation -PT/OT following -Will need to optimize patient from a diabetic and hyperlipidemia standpoint -Start atorvastatin 80 mg daily  #Type 2 diabetes mellitus Patient has a history  of type 2 diabetes mellitus. A1c resulted at 8.1.  Patient is uncontrolled.  Did start sliding scale insulin, but blood sugars remain elevated.  Will resume home 12 units of Semglee.  Will continue to titrate insulin. -Start back home dose of 12 units Semglee -Continue sliding scale insulin -Goal glucose less than 180 -Monitor CBGs  #Concern for dementia Patient confused this morning and agitated.  Not appropriately answering questions at times.  Seems to be having some hospital-acquired delirium.  Did have more conversation with wife who states that patient has been declining, and there is some concern for dementia in which there will be evaluation with neurology on 01/16/2023.  This would likely be vascular dementia. -Delirium precautions -Did get 1 dose of Seroquel today -Sitter ordered -Follow-up with neurology outpatient  #Hypertension Patient is on metoprolol, lisinopril, and HCTZ at home.  Will hold lisinopril and HCTZ in setting of elevated creatinine at this time. -Resume home metoprolol 25 mg daily -Continue to monitor blood pressure closely  #Heart failure with recovered ejection fraction -No acute concern for exacerbation at this time -Resume home metoprolol 25 mg daily  #Anxiety Patient does take Xanax at home, but given altered mental status we will hold Xanax.  #Insomnia Start melatonin which is patient's home medication.  #BPH -Continue Flomax daily  Diet: Heart Healthy IVF: None,None VTE: DOAC Code: Full PT/OT recs: CIR  Dispo: Anticipated discharge to Rehab in 3 days pending clinical improvement.   Modena Slater DO Internal Medicine Resident PGY-2 [phone] Please contact the on call pager after 5 pm and on weekends at [phone].

## 2023-01-07 NOTE — Progress Notes (Signed)
Inpatient Rehab Admissions Coordinator:  ? ?Per therapy recommendations,  patient was screened for CIR candidacy by Devaney Segers, MS, CCC-SLP. At this time, Pt. Appears to be a a potential candidate for CIR. I will place   order for rehab consult per protocol for full assessment. Please contact me any with questions. ? ?Trine Fread, MS, CCC-SLP ?Rehab Admissions Coordinator  ?[phone] (celll) ?[phone] (office) ? ?

## 2023-01-07 NOTE — Progress Notes (Signed)
Patient is alert to self currently. Patient became progressively confused as the night went on. Initially kept pulling off tele leads and apologizing but continued to do so. Keeps getting out of bed and rambling sentences such as "Does the house next door have the light on?" He is becoming increasingly agitated saying that we are keeping something from him. No hx of dementia. Keeps getting out of bed every 15 minutes. Afraid for his safety. He will get back in bed when assisted after bed alarm goes off but he gets right back up when we leave so not really redirectable. Contacted provider on call.

## 2023-01-08 ENCOUNTER — Inpatient Hospital Stay (HOSPITAL_COMMUNITY): Payer: Medicare HMO

## 2023-01-08 ENCOUNTER — Other Ambulatory Visit (HOSPITAL_COMMUNITY): Payer: Self-pay

## 2023-01-08 ENCOUNTER — Encounter (HOSPITAL_COMMUNITY): Payer: Self-pay | Admitting: Infectious Diseases

## 2023-01-08 DIAGNOSIS — I6389 Other cerebral infarction: Secondary | ICD-10-CM | POA: Diagnosis not present

## 2023-01-08 DIAGNOSIS — I639 Cerebral infarction, unspecified: Secondary | ICD-10-CM | POA: Diagnosis not present

## 2023-01-08 LAB — CBC
HCT: 42.1 % (ref 39.0–52.0)
Hemoglobin: 14.2 g/dL (ref 13.0–17.0)
MCH: 30.3 pg (ref 26.0–34.0)
MCHC: 33.7 g/dL (ref 30.0–36.0)
MCV: 89.8 fL (ref 80.0–100.0)
Platelets: 216 10*3/uL (ref 150–400)
RBC: 4.69 MIL/uL (ref 4.22–5.81)
RDW: 13.8 % (ref 11.5–15.5)
WBC: 15.3 10*3/uL — ABNORMAL HIGH (ref 4.0–10.5)
nRBC: 0 % (ref 0.0–0.2)

## 2023-01-08 LAB — GLUCOSE, CAPILLARY
Glucose-Capillary: 252 mg/dL — ABNORMAL HIGH (ref 70–99)
Glucose-Capillary: 157 mg/dL — ABNORMAL HIGH (ref 70–99)
Glucose-Capillary: 214 mg/dL — ABNORMAL HIGH (ref 70–99)

## 2023-01-08 LAB — BASIC METABOLIC PANEL
Anion gap: 11 (ref 5–15)
BUN: 28 mg/dL — ABNORMAL HIGH (ref 8–23)
CO2: 24 mmol/L (ref 22–32)
Calcium: 9.5 mg/dL (ref 8.9–10.3)
Chloride: 102 mmol/L (ref 98–111)
Creatinine, Ser: 1.1 mg/dL (ref 0.61–1.24)
GFR, Estimated: >60 mL/min (ref >60)
Glucose, Bld: 156 mg/dL — ABNORMAL HIGH (ref 70–99)
Potassium: 4.2 mmol/L (ref 3.5–5.1)
Sodium: 137 mmol/L (ref 135–145)

## 2023-01-08 LAB — ECHOCARDIOGRAM COMPLETE BUBBLE STUDY
Area-P 1/2: 1.84 cm2
S' Lateral: 3.3 cm

## 2023-01-08 MED ORDER — PERFLUTREN LIPID MICROSPHERE
1.0000 mL | INTRAVENOUS | Status: AC | PRN
  Administered 2023-01-08: 3 mL via INTRAVENOUS

## 2023-01-08 MED ORDER — IRBESARTAN 150 MG PO TABS
150.0000 mg | ORAL_TABLET | Freq: Every day | ORAL | 0 refills | Status: DC
  Filled 2023-01-08: qty 30, 30d supply, fill #0

## 2023-01-08 MED ORDER — CLOPIDOGREL BISULFATE 75 MG PO TABS
75.0000 mg | ORAL_TABLET | Freq: Every day | ORAL | 0 refills | Status: DC
  Filled 2023-01-08: qty 30, 30d supply, fill #0

## 2023-01-08 MED ORDER — INSULIN GLARGINE 100 UNIT/ML SOLOSTAR PEN
15.0000 [IU] | PEN_INJECTOR | Freq: Every day | SUBCUTANEOUS | 0 refills | Status: AC
  Filled 2023-01-08: qty 15, 100d supply, fill #0

## 2023-01-08 MED ORDER — STROKE: EARLY STAGES OF RECOVERY BOOK
Freq: Once | Status: AC
  Filled 2023-01-08: qty 1

## 2023-01-08 MED ORDER — METOPROLOL SUCCINATE ER 25 MG PO TB24
12.5000 mg | ORAL_TABLET | Freq: Every day | ORAL | 0 refills | Status: AC
  Filled 2023-01-08: qty 30, 60d supply, fill #0

## 2023-01-08 MED ORDER — ATORVASTATIN CALCIUM 80 MG PO TABS
80.0000 mg | ORAL_TABLET | Freq: Every day | ORAL | 0 refills | Status: AC
  Filled 2023-01-08: qty 30, 30d supply, fill #0

## 2023-01-08 NOTE — TOC Transition Note (Signed)
Transition of Care [facility]) - CM/SW Discharge Note   Patient Details  Name: Ricardo Le MRN: [id number] Date of Birth: 30-Jan-1941  Transition of Care [facility]) CM/SW Contact:  Kermit Balo, RN Phone Number: 01/08/2023, 12:05 PM   Clinical Narrative:     Pt is discharging home with family. Family has declined CIR. They can provide supervision needed at home. Choice for home health provided to the spouse. She has no preference. Home health arranged with [facility]. Information on the AVS. [facility] will contact her for the first home visit. Pt has all needed DME at home except a shower chair. Wife to purchase a shower seat outside the hospital setting.  Wife and family to manage the patients medications at home. Family to provide transportation home.  Final next level of care: Home w Home Health Services Barriers to Discharge: No Barriers Identified   Patient Goals and CMS Choice CMS Medicare.gov Compare Post Acute Care list provided to:: Patient Represenative (must comment) Choice offered to / list presented to : Spouse  Discharge Placement                         Discharge Plan and Services Additional resources added to the After Visit Summary for                            [facility] Arranged: PT, OT, RN [facility] Agency: [facility] Date [facility] Agency Contacted: 01/08/23   Representative spoke with at [facility] Agency: Tresa Endo  Social Determinants of Health (SDOH) Interventions SDOH Screenings   Food Insecurity: No Food Insecurity (01/06/2023)  Housing: Low Risk  (01/06/2023)  Transportation Needs: No Transportation Needs (01/06/2023)  Utilities: Not At Risk (01/06/2023)  Tobacco Use: Low Risk  (09/13/2022)     Readmission Risk Interventions     No data to display

## 2023-01-08 NOTE — Progress Notes (Signed)
Physical Therapy Treatment Patient Details Name: Ricardo Le MRN: [id number] DOB: [birth year] Today's Date: 01/08/2023   History of Present Illness 82 yo M comes to ED 01/06/23 with L hand/arm weakness. Head CT-Small chronic posterior right MCA territory infarct and advanced  chronic bilateral white matter disease. He had CT angio which showed critical stenosis of B vertebral arteries.   PMH- DM, HTN, CAD (CABG?),    PT Comments  Wife and son present for session. Patient ambulating much better today. Able to demonstrate gait with RW and straight cane without loss of balance. Patient showed insight into deficits as he reports he will use RW for walking at home until he feels stronger. Discussed supervision whenever he is up walking. Noted family refused AIR and HH arranged.     If plan is discharge home, recommend the following: A little help with walking and/or transfers;A lot of help with bathing/dressing/bathroom;Assistance with cooking/housework;Direct supervision/assist for medications management;Direct supervision/assist for financial management;Assist for transportation;Help with stairs or ramp for entrance;Supervision due to cognitive status   Can travel by private vehicle        Equipment Recommendations  None recommended by PT    Recommendations for Other Services       Precautions / Restrictions Precautions Precautions: Fall Restrictions Weight Bearing Restrictions: No     Mobility  Bed Mobility Overal bed mobility: Independent             General bed mobility comments: to/from supine    Transfers Overall transfer level: Needs assistance Equipment used: Rolling walker (2 wheels), Straight cane Transfers: Sit to/from Stand Sit to Stand: Contact guard assist           General transfer comment: max verbal cues for sequencing with RW; no cues with cane    Ambulation/Gait Ambulation/Gait assistance: Contact guard assist Gait Distance (Feet): 100 Feet  (cane; 100' RW) Assistive device: Rolling walker (2 wheels), Straight cane Gait Pattern/deviations: Step-through pattern, Decreased stride length, Trunk flexed   Gait velocity interpretation: 1.31 - 2.62 ft/sec, indicative of limited community ambulator   General Gait Details: walked with cane initially as that is what he normally uses; much improved without lateral drift or LOB; pt then stated he will probably use RW at home and walked again with RW; pt maintaining appropriate position within RW and had no episodes of kicking left leg of RW or imbalance; cues for proximity to [employer] Mobility     Tilt Bed    Modified Rankin (Stroke Patients Only) Modified Rankin (Stroke Patients Only) Pre-Morbid Rankin Score: Moderately severe disability Modified Rankin: Moderately severe disability     Balance Overall balance assessment: Needs assistance Sitting-balance support: No upper extremity supported, Feet supported Sitting balance-Leahy Scale: Good     Standing balance support: Reliant on assistive device for balance, Single extremity supported Standing balance-Leahy Scale: Poor                              Cognition Arousal: Alert Behavior During Therapy: WFL for tasks assessed/performed Overall Cognitive Status: History of cognitive impairments - at baseline                                 General Comments: wife and son present and report baseline  Exercises      General Comments General comments (skin integrity, edema, etc.): Wife and son present. Discussed with wife need for supervision when he is up walking. Recommend RW until he feels he is stronger. She reports she has a seated exerciser (bicycle-like) and agreed this would be appropriate for pt to use.      Pertinent Vitals/Pain Pain Assessment Pain Assessment: No/denies pain    Home Living                          Prior Function             PT Goals (current goals can now be found in the care plan section) Acute Rehab PT Goals Patient Stated Goal: go home Time For Goal Achievement: 01/21/23 Potential to Achieve Goals: Good Progress towards PT goals: Progressing toward goals    Frequency    Min 1X/week      PT Plan      Co-evaluation              AM-PAC PT "6 Clicks" Mobility   Outcome Measure  Help needed turning from your back to your side while in a flat bed without using bedrails?: None Help needed moving from lying on your back to sitting on the side of a flat bed without using bedrails?: None Help needed moving to and from a bed to a chair (including a wheelchair)?: A Little Help needed standing up from a chair using your arms (e.g., wheelchair or bedside chair)?: A Little Help needed to walk in hospital room?: A Little Help needed climbing 3-5 steps with a railing? : A Little 6 Click Score: 20    End of Session Equipment Utilized During Treatment: Gait belt Activity Tolerance: Patient tolerated treatment well Patient left: with call bell/phone within reach;in bed;with bed alarm set;with family/visitor present Nurse Communication: Mobility status;Other (comment) (family educated and feels good with taking pt home) PT Visit Diagnosis: Other abnormalities of gait and mobility (R26.89);Muscle weakness (generalized) (M62.81);Other symptoms and signs involving the nervous system (R29.898)     Time: 6644-0347 PT Time Calculation (min) (ACUTE ONLY): 15 min  Charges:    $Gait Training: 8-22 mins PT General Charges $$ ACUTE PT VISIT: 1 Visit                      Jerolyn Center, PT Acute Rehabilitation Services  Office [phone]    Zena Amos 01/08/2023, 1:22 PM

## 2023-01-08 NOTE — Discharge Summary (Addendum)
Name: Ricardo Le MRN: [id number] DOB: 04-06-1940 82 y.o. PCP: Noni Saupe, MD  Date of Admission: 01/06/2023  8:05 AM Date of Discharge: 01/08/2023 Attending Physician: Reymundo Poll, MD  Discharge Diagnosis: 1. Principal Problem:   Acute CVA (cerebrovascular accident) [facility]) Active Problems:   Diabetes mellitus (HCC)   Elevated lipids   Essential hypertension   Heart failure with recovered ejection fraction (HFrecEF) [facility])   Discharge Medications: Allergies as of 01/08/2023       Reactions   Bentyl [dicyclomine] Anaphylaxis   Crestor [rosuvastatin] Other (See Comments)   Myalgias    Ranexa [ranolazine] Other (See Comments)   Weakness    Roxicodone [oxycodone] Nausea And Vomiting   Ultram [tramadol] Other (See Comments)   Makes patient "spaced out and dizzy" but ok to take rarely   Iodinated Contrast Media Hives, Itching, Rash   Iohexol Hives, Itching   Patient is ok with premedication (Benadryl 50 mg by mouth); Onset Date: 12/03/2007   Ioversol Hives, Itching, Rash   Statins Other (See Comments)   Myalgias Able to tolerate atorvastatin        Medication List     STOP taking these medications    glimepiride 2 MG tablet Commonly known as: AMARYL   lisinopril-hydrochlorothiazide 20-12.5 MG tablet Commonly known as: ZESTORETIC       TAKE these medications    acetaminophen 325 MG tablet Commonly known as: TYLENOL Take 650 mg by mouth 2 (two) times daily as needed (pain).   Admelog 100 UNIT/ML injection Generic drug: insulin lispro Inject 3 Units into the skin See admin instructions. Inject 3 units twice daily before lunch and supper.   albuterol 108 (90 Base) MCG/ACT inhaler Commonly known as: VENTOLIN HFA Inhale 2 puffs into the lungs every 6 (six) hours as needed for wheezing or shortness of breath.   aspirin EC 81 MG tablet Take 81 mg by mouth daily.   atorvastatin 80 MG tablet Commonly known as: LIPITOR Take 1 tablet (80 mg  total) by mouth daily. Start taking on: January 09, 2023 What changed:  medication strength how much to take   BENEFIBER PO Take 1 Scoop by mouth See admin instructions. Give 1 Tablespoon mixed into drink every morning after breakfast.   clopidogrel 75 MG tablet Commonly known as: PLAVIX Take 1 tablet (75 mg total) by mouth daily. Start taking on: January 09, 2023   insulin glargine 100 UNIT/ML Solostar Pen Commonly known as: LANTUS Inject 15 Units into the skin at bedtime. What changed: how much to take   irbesartan 150 MG tablet Commonly known as: Avapro Take 1 tablet (150 mg total) by mouth daily.   Jardiance 10 MG Tabs tablet Generic drug: empagliflozin Take 10 mg by mouth daily.   Melatonin 10 MG Tabs Take 10 mg by mouth at bedtime.   metoprolol succinate 25 MG 24 hr tablet Commonly known as: TOPROL-XL Take 0.5 tablets (12.5 mg total) by mouth daily. Start taking on: January 09, 2023 What changed: how much to take   MILK OF MAGNESIA PO Take 30 mLs by mouth See admin instructions. Give 30 mL every morning. Repeat dose if no BM prior to lunch. Repeat dose once more if no BM before supper.   nitroGLYCERIN 0.4 MG SL tablet Commonly known as: NITROSTAT DISSOLVE 1 TABLET UNDER THE TONGUE EVERY 5 MINUTES AS NEEDED FOR CHEST PAIN   pantoprazole 40 MG tablet Commonly known as: PROTONIX Take 40 mg by mouth 2 (two) times daily.  tamsulosin 0.4 MG Caps capsule Commonly known as: FLOMAX Take 1 capsule (0.4 mg total) by mouth daily after supper. What changed: when to take this   traMADol 50 MG tablet Commonly known as: ULTRAM Take 50 mg by mouth 2 (two) times daily as needed for moderate pain.               Durable Medical Equipment  (From admission, onward)           Start     Ordered   01/08/23 1250  DME Tub Bench  Once        01/08/23 1253   01/07/23 1248  For home use only DME Shower stool  Once        01/07/23 1247               Discharge Care Instructions  (From admission, onward)           Start     Ordered   01/08/23 0000  Discharge wound care:       Comments: Change the dressing on your wound daily.  If the area develops increased redness, swelling, or increase in temperature, please bring the patient to his PCP.   01/08/23 1253            Disposition and follow-up:   Mr.Ricardo Le was discharged from [facility] in Stable condition.  At the hospital follow up visit please address:  1.  We altered his blood pressure medications while he was inpatient/discharging.  - Cut metoprolol from 25 mg to 12.5 mg since we noted bradycardia several times while on telemetry.  - Started him on Irbesartan instead of the lisinopril/hydrochlorothiazide combination.  - Echocardiogram showed LVEF of 40-45%. Patient has been euvolemic during hospitalization.  - Dementia concerns - Patient has demonstrated some signs of dementia including changes of personality, forgetfulness, difficulty walking. Consider further evaluation for lewy body vs vascular vs frontotemporal.  2.  Labs / imaging needed at time of follow-up: None.  3.  Pending labs/ test needing follow-up: None.  Follow-up Appointments:  Follow-up Information     [facility]. Schedule an appointment as soon as possible for a visit in 1 month(s).   Specialty: Neurology Why: stroke clinic Contact information: [address] [place] [address] [phone]        [facility] up.   Specialty: Home Health Services Why: The home health agency will contact you for the first home visit. Contact information: [address] [place] [address] [phone]                 Hospital Course by problem list: #Acute right lateral thalamus infarct #History of prior CVA Patient evaluated at bedside this morning.  Patient is doing well.   Weakness is improving.  Sensation is improving.  No acute concern for worsening of neurological status. Pt has a previously scheduled outpatient neurology appointment scheduled on 01/16/2023. -Continue aspirin 81 mg daily -Continue Plavix 75 mg daily -Neurology following, appreciate recommendation -Discharging patient with home health and PT/OT -Start atorvastatin 80 mg daily   #Type 2 diabetes mellitus Patient has a history of type 2 diabetes mellitus. A1c resulted at 8.1.  Patient is uncontrolled.  Did start sliding scale insulin, but blood sugars remain elevated. Patient was on a combination of a sulfonurea drug and metoprolol at home, which could make recognizing hypoglycemia difficult. Discontinued glimipiride, increased Lantus back to  15 for outpatient -Start back home dose of 15 units lantus -Continue lunch and dinner humalog 3 units. -Continue Jardiance 10 mg outpatient.   #Concern for dementia Patient had several instances of "sun-downing" or alterations in mood and cognition in the late afternoon and evening.  Not appropriately answering questions at times.  Seems to be having some hospital-acquired delirium.  Did have more conversation with wife who states that patient has been declining, and there is some concern for dementia in which there will be evaluation with neurology on 01/16/2023. This would likely be vascular dementia. - Patient responded well to a low dose of Seroquel, but did not need it nightly, also did not want to prescribe long term due to concerns about a prolonged QtC.    #Hypertension Patient was on metoprolol, lisinopril, and HCTZ at home. Held hydrochlorothiazide and lisinopril during admission and reduced metoprolol due to bradycardia. Starting irbesartan. - Discharge home metoprolol 12.5 mg daily - Irbesartan 150 mg daily - Evaluate whether he needs    #Heart failure with recovered ejection fraction There are mixed records in the patient's chart showing some  evidence of ischemic heart failure with recovered ejection fraction. The patient's Echo on day of discharge, 11/25 showed LVEF of 40-45%. - Reduced metoprolol to 12.5 mg due to bradycardia while inpatient. Continue this dose outpatient.   #Anxiety Patient does take Xanax at home, but given altered mental status we will hold Xanax. Do not recommend this medication outpatient as patient had signs of delirium while inpatient.   #Insomnia - Continue 10 mg melatonin   #BPH -Continue Flomax daily  Discharge Exam:   BP (!) 156/64 (BP Location: Left Arm)   Pulse (!) 50   Temp 98.4 F (36.9 C) (Oral)   Resp 18   Ht 5\' 5"  (1.651 m)   Wt 70 kg   SpO2 97%   BMI 25.68 kg/m  Discharge exam:  VITALS: Reviewed. WEIGHT/BMI reviewed. GEN: Healthy appearing, well-developed, NAD. HEENT    -Head: [place]/AT;    -Ears: External ears are normal. Normal TMs.    -Nose: Normal nares.    -Mouth and throat: MMM. Normal gums, mucosa, palate,.  NECK: Supple, with no masses. CV: RRR, no m/r/g. LUNGS: CTAB, no w/r/c. ABD: Soft, NT/ND, NBS, no masses or organomegaly. SKIN: Warm, well perfused. No skin rashes or abnormal lesions. EXT: No clubbing, cyanosis, or edema. PSYCH: Good Judgment. AOx3. Grossly impaired memory and cognition. Mood and affect congruent and appropriate to situation.  Neuro: Orientation: Person, Place, not Day, but month/year,  Facial: symmetric at rest/smile, facial sensation intact Tongue: midline, no fasciculations Eye exam: pupils symmetric, PERRL, + vertical/horizontal nystagmus. Tracking lost across midline  Visual fields oppositional testing: RUQ - grossly intact (correctly identifies number of fingers) RLQ - grossly intact (correctly identifies number of fingers) LUQ - grossly intact (correctly identifies number of fingers) LLQ - grossly intact (correctly identifies number of fingers)  Extremities: RUE - sensation intact, 3 sites. 5/5 strength, reflexes grossly intact. LUE  - sensation intact, 3 sites. 4/5 strength, reflexes grossly intact. RLE - sensation intact, 2 sites. 5/5 strength, reflexes grossly intact. LLE - sensation intact, 2 sites. 4/5 strength, reflexes grossly intact.  Speech Repetition: "No ifs ands or buts about it, today is a nice day" (improved since initial exam) Fluency: No word finding difficulty, no slurring.   Pertinent Labs, Studies, and Procedures:   MRI Head/MRA 01/06/23: IMPRESSION: 1. Acute subcentimeter infarction of the right lateral thalamus. 2. Chronic small-vessel ischemic changes  of the pons and cerebral hemispheric white matter. Old right parietal vertex cortical and subcortical infarction.  Echocardiogram, 2D. 01/08/2023: IMPRESSIONS   1. Left ventricular ejection fraction, by estimation, is 40 to 45%. The left ventricle has mildly decreased function. The left ventricle demonstrates regional wall motion abnormalities (see scoring diagram/findings for description). Left ventricular  diastolic parameters are consistent with Grade I diastolic dysfunction (impaired relaxation).   2. Right ventricular systolic function is mildly reduced. The right ventricular size is normal.   3. Left atrial size was mildly dilated.   4. The mitral valve is normal in structure. Mild mitral valve  regurgitation. No evidence of mitral stenosis.   5. The aortic valve is tricuspid. There is moderate calcification of the aortic valve. Aortic valve regurgitation is not visualized. Aortic valve sclerosis/calcification is present, without any evidence of aortic stenosis.   6. The inferior vena cava is normal in size with greater than 50% respiratory variability, suggesting right atrial pressure of 3 mmHg.   7. Agitated saline contrast bubble study was negative, with no evidence of any interatrial shunt.    Discharge Instructions: Discharge Instructions     (HEART FAILURE PATIENTS) Call MD:  Anytime you have any of the following symptoms: 1) 3  pound weight gain in 24 hours or 5 pounds in 1 week 2) shortness of breath, with or without a dry hacking cough 3) swelling in the hands, feet or stomach 4) if you have to sleep on extra pillows at night in order to breathe.   Complete by: As directed    Ambulatory referral to Neurology   Complete by: As directed    Follow up with stroke clinic NP (Jessica Vanschaick or Darrol Angel, if both not available, consider Manson Allan, or Ahern) at [facility] in about 4 weeks. Thanks.   Ambulatory referral to Physical Therapy   Complete by: As directed    Prefers to be in [place], [place]   Call MD for:  redness, tenderness, or signs of infection (pain, swelling, redness, odor or green/yellow discharge around incision site)   Complete by: As directed    Call MD for:  temperature >100.4   Complete by: As directed    Diet - low sodium heart healthy   Complete by: As directed    Discharge instructions   Complete by: As directed    Mr Wurm, It was a pleasure taking care of you at [facility]. You were admitted for left sided weakness and tingling and treated for an acute stroke in the right lateral thalamus (affecting sensation and sometimes strength on the LEFT side of your body). We are discharging you home now that you are doing better. Please follow the following instructions:  1) Stroke Care - We are discharging you with home health. The transitions of care team should be helping to coordinate home health for you. Our goals are for you to get help with physical therapy and keeping your blood sugar levels under tight control.  2) We are reducing the dose of your metoprolol to 12.5 mg daily.  3) We are adding on a different medication for your blood pressure control that we hope will help, it is called "irbesartan." 4) We are discontinuing the medication glimepiride because it can sometimes cause blood sugars that are too low. We would prefer you continue to use your insulin to correct blood  sugar levels that are too high. 5) Please make sure that you work hard in your physical therapy and  make your follow up appointments with Neurology AND your PCP.   Take care,  Signed: Christie Nottingham, MD [facility] Physician, PGY-1 01/08/2023 11:31 AM   Discharge wound care:   Complete by: As directed    Change the dressing on your wound daily.  If the area develops increased redness, swelling, or increase in temperature, please bring the patient to his PCP.   Increase activity slowly   Complete by: As directed       Mr Kumari, It was a pleasure taking care of you at [facility]. You were admitted for left sided weakness and tingling and treated for an acute stroke in the right lateral thalamus (affecting sensation and sometimes strength on the LEFT side of your body). We are discharging you home now that you are doing better. Please follow the following instructions:  1) Stroke Care - We are discharging you with home health. The transitions of care team should be helping to coordinate home health for you. Our goals are for you to get help with physical therapy and keeping your blood sugar levels under tight control.  2) We are reducing the dose of your metoprolol to 12.5 mg daily.  3) We are adding on a different medication for your blood pressure control that we hope will help, it is called "irbesartan." 4) We are discontinuing the medication glimepiride because it can sometimes cause blood sugars that are too low. We would prefer you continue to use your insulin to correct blood sugar levels that are too high. 5) Please make sure that you work hard in your physical therapy and make your follow up appointments with Neurology AND your PCP.  Take care,  Signed: Christie Nottingham, MD [facility] Physician, PGY-1 01/08/2023 11:31 AM

## 2023-01-08 NOTE — Progress Notes (Signed)
Inpatient Rehab Admissions Coordinator:   Per chart, patient/family refusing CIR. Will sign off. Patient going home.   Rehab Admissons Coordinator [place], [place], [place] [phone]

## 2023-01-08 NOTE — Discharge Instructions (Addendum)
[name], It was a pleasure taking care of you at [facility]. You were admitted for left sided weakness and tingling and treated for an acute stroke in the right lateral thalamus (affecting sensation and sometimes strength on the LEFT side of your body). We are discharging you home now that you are doing better. Please follow the following instructions:  1) Stroke Care - We are discharging you with home health. The transitions of care team should be helping to coordinate home health for you. Our goals are for you to get help with physical therapy and keeping your blood sugar levels under tight control.  2) We are reducing the dose of your metoprolol to 12.5 mg daily.  3) We are adding on a different medication for your blood pressure control that we hope will help, it is called "irbesartan." 4) We are discontinuing the medication glimepiride because it can sometimes cause blood sugars that are too low. We would prefer you continue to use your insulin to correct blood sugar levels that are too high. 5) Please make sure that you work hard in your physical therapy and make your follow up appointments with Neurology AND your PCP.   Take care,  Signed: Christie Nottingham, MD [facility] Physician, PGY-1 01/08/2023 11:31 AM

## 2023-01-08 NOTE — Progress Notes (Signed)
Inpatient Rehab Coordinator Note:  I met with patient at bedside to discuss CIR recommendations and goals/expectations of CIR stay.  We reviewed 3 hrs/day of therapy, physician follow up, and average length of stay 2 weeks (dependent upon progress) with goals of mod I. Patient is agreeable to CIR states he has been before, no family in room. Called wife and left message on voicemail. Will continue to follow.     Rehab Admissons Coordinator [place], [place], [place] [phone]

## 2023-01-08 NOTE — Progress Notes (Signed)
Echocardiogram 2D Echocardiogram has been performed.  Warren Lacy Jyll Tomaro RDCS 01/08/2023, 11:24 AM

## 2023-01-08 NOTE — Plan of Care (Signed)
  Problem: Education: Goal: Ability to describe self-care measures that may prevent or decrease complications (Diabetes Survival Skills Education) will improve Outcome: Progressing Goal: Individualized Educational Video(s) Outcome: Progressing   Problem: Fluid Volume: Goal: Ability to maintain a balanced intake and output will improve Outcome: Progressing   Problem: Health Behavior/Discharge Planning: Goal: Ability to identify and utilize available resources and services will improve Outcome: Progressing Goal: Ability to manage health-related needs will improve Outcome: Progressing   Problem: Metabolic: Goal: Ability to maintain appropriate glucose levels will improve Outcome: Progressing   Problem: Nutritional: Goal: Maintenance of adequate nutrition will improve Outcome: Progressing Goal: Progress toward achieving an optimal weight will improve Outcome: Progressing   Problem: Skin Integrity: Goal: Risk for impaired skin integrity will decrease Outcome: Progressing   Problem: Tissue Perfusion: Goal: Adequacy of tissue perfusion will improve Outcome: Progressing   Problem: Education: Goal: Knowledge of General Education information will improve Description: Including pain rating scale, medication(s)/side effects and non-pharmacologic comfort measures Outcome: Progressing   Problem: Health Behavior/Discharge Planning: Goal: Ability to manage health-related needs will improve Outcome: Progressing   Problem: Clinical Measurements: Goal: Ability to maintain clinical measurements within normal limits will improve Outcome: Progressing Goal: Will remain free from infection Outcome: Progressing Goal: Diagnostic test results will improve Outcome: Progressing Goal: Respiratory complications will improve Outcome: Progressing Goal: Cardiovascular complication will be avoided Outcome: Progressing   Problem: Activity: Goal: Risk for activity intolerance will  decrease Outcome: Progressing   Problem: Nutrition: Goal: Adequate nutrition will be maintained Outcome: Progressing   Problem: Coping: Goal: Level of anxiety will decrease Outcome: Progressing   Problem: Elimination: Goal: Will not experience complications related to bowel motility Outcome: Progressing Goal: Will not experience complications related to urinary retention Outcome: Progressing   Problem: Pain Management: Goal: General experience of comfort will improve Outcome: Progressing   Problem: Safety: Goal: Ability to remain free from injury will improve Outcome: Progressing   Problem: Skin Integrity: Goal: Risk for impaired skin integrity will decrease Outcome: Progressing

## 2023-01-10 DIAGNOSIS — Z86718 Personal history of other venous thrombosis and embolism: Secondary | ICD-10-CM | POA: Diagnosis not present

## 2023-01-10 DIAGNOSIS — E7800 Pure hypercholesterolemia, unspecified: Secondary | ICD-10-CM | POA: Diagnosis not present

## 2023-01-10 DIAGNOSIS — Z87442 Personal history of urinary calculi: Secondary | ICD-10-CM | POA: Diagnosis not present

## 2023-01-10 DIAGNOSIS — H9190 Unspecified hearing loss, unspecified ear: Secondary | ICD-10-CM | POA: Diagnosis not present

## 2023-01-10 DIAGNOSIS — Z794 Long term (current) use of insulin: Secondary | ICD-10-CM | POA: Diagnosis not present

## 2023-01-10 DIAGNOSIS — N39498 Other specified urinary incontinence: Secondary | ICD-10-CM | POA: Diagnosis not present

## 2023-01-10 DIAGNOSIS — Z8701 Personal history of pneumonia (recurrent): Secondary | ICD-10-CM | POA: Diagnosis not present

## 2023-01-10 DIAGNOSIS — G4700 Insomnia, unspecified: Secondary | ICD-10-CM | POA: Diagnosis not present

## 2023-01-10 DIAGNOSIS — F419 Anxiety disorder, unspecified: Secondary | ICD-10-CM | POA: Diagnosis not present

## 2023-01-10 DIAGNOSIS — Z7902 Long term (current) use of antithrombotics/antiplatelets: Secondary | ICD-10-CM | POA: Diagnosis not present

## 2023-01-10 DIAGNOSIS — Z7984 Long term (current) use of oral hypoglycemic drugs: Secondary | ICD-10-CM | POA: Diagnosis not present

## 2023-01-10 DIAGNOSIS — I69354 Hemiplegia and hemiparesis following cerebral infarction affecting left non-dominant side: Secondary | ICD-10-CM | POA: Diagnosis not present

## 2023-01-10 DIAGNOSIS — I2510 Atherosclerotic heart disease of native coronary artery without angina pectoris: Secondary | ICD-10-CM | POA: Diagnosis not present

## 2023-01-10 DIAGNOSIS — Z9081 Acquired absence of spleen: Secondary | ICD-10-CM | POA: Diagnosis not present

## 2023-01-10 DIAGNOSIS — K219 Gastro-esophageal reflux disease without esophagitis: Secondary | ICD-10-CM | POA: Diagnosis not present

## 2023-01-10 DIAGNOSIS — E1151 Type 2 diabetes mellitus with diabetic peripheral angiopathy without gangrene: Secondary | ICD-10-CM | POA: Diagnosis not present

## 2023-01-10 DIAGNOSIS — I5020 Unspecified systolic (congestive) heart failure: Secondary | ICD-10-CM | POA: Diagnosis not present

## 2023-01-10 DIAGNOSIS — Z8507 Personal history of malignant neoplasm of pancreas: Secondary | ICD-10-CM | POA: Diagnosis not present

## 2023-01-10 DIAGNOSIS — F1120 Opioid dependence, uncomplicated: Secondary | ICD-10-CM | POA: Diagnosis not present

## 2023-01-10 DIAGNOSIS — Z90410 Acquired total absence of pancreas: Secondary | ICD-10-CM | POA: Diagnosis not present

## 2023-01-10 DIAGNOSIS — N401 Enlarged prostate with lower urinary tract symptoms: Secondary | ICD-10-CM | POA: Diagnosis not present

## 2023-01-10 DIAGNOSIS — I110 Hypertensive heart disease with heart failure: Secondary | ICD-10-CM | POA: Diagnosis not present

## 2023-01-10 DIAGNOSIS — Z7982 Long term (current) use of aspirin: Secondary | ICD-10-CM | POA: Diagnosis not present

## 2023-01-10 DIAGNOSIS — Z951 Presence of aortocoronary bypass graft: Secondary | ICD-10-CM | POA: Diagnosis not present

## 2023-01-11 ENCOUNTER — Emergency Department (HOSPITAL_COMMUNITY): Payer: Medicare HMO

## 2023-01-11 ENCOUNTER — Emergency Department (HOSPITAL_COMMUNITY)
Admission: EM | Admit: 2023-01-11 | Discharge: 2023-01-11 | Disposition: A | Discharge status: Home / Self Care | Payer: Medicare HMO | Attending: Emergency Medicine | Admitting: Emergency Medicine

## 2023-01-11 ENCOUNTER — Other Ambulatory Visit: Payer: Self-pay

## 2023-01-11 ENCOUNTER — Encounter (HOSPITAL_COMMUNITY): Payer: Self-pay

## 2023-01-11 DIAGNOSIS — D72829 Elevated white blood cell count, unspecified: Secondary | ICD-10-CM | POA: Insufficient documentation

## 2023-01-11 DIAGNOSIS — Z8673 Personal history of transient ischemic attack (TIA), and cerebral infarction without residual deficits: Secondary | ICD-10-CM | POA: Insufficient documentation

## 2023-01-11 DIAGNOSIS — Z7982 Long term (current) use of aspirin: Secondary | ICD-10-CM | POA: Diagnosis not present

## 2023-01-11 DIAGNOSIS — R0989 Other specified symptoms and signs involving the circulatory and respiratory systems: Secondary | ICD-10-CM | POA: Diagnosis not present

## 2023-01-11 DIAGNOSIS — Z743 Need for continuous supervision: Secondary | ICD-10-CM | POA: Diagnosis not present

## 2023-01-11 DIAGNOSIS — R200 Anesthesia of skin: Secondary | ICD-10-CM | POA: Diagnosis not present

## 2023-01-11 DIAGNOSIS — R9089 Other abnormal findings on diagnostic imaging of central nervous system: Secondary | ICD-10-CM | POA: Diagnosis not present

## 2023-01-11 DIAGNOSIS — R001 Bradycardia, unspecified: Secondary | ICD-10-CM | POA: Diagnosis not present

## 2023-01-11 DIAGNOSIS — I700 Atherosclerosis of aorta: Secondary | ICD-10-CM | POA: Diagnosis not present

## 2023-01-11 DIAGNOSIS — Z794 Long term (current) use of insulin: Secondary | ICD-10-CM | POA: Insufficient documentation

## 2023-01-11 DIAGNOSIS — R39198 Other difficulties with micturition: Secondary | ICD-10-CM | POA: Insufficient documentation

## 2023-01-11 DIAGNOSIS — Z7902 Long term (current) use of antithrombotics/antiplatelets: Secondary | ICD-10-CM | POA: Diagnosis not present

## 2023-01-11 DIAGNOSIS — R739 Hyperglycemia, unspecified: Secondary | ICD-10-CM | POA: Diagnosis not present

## 2023-01-11 DIAGNOSIS — I6782 Cerebral ischemia: Secondary | ICD-10-CM | POA: Diagnosis not present

## 2023-01-11 DIAGNOSIS — E236 Other disorders of pituitary gland: Secondary | ICD-10-CM | POA: Diagnosis not present

## 2023-01-11 DIAGNOSIS — R0602 Shortness of breath: Secondary | ICD-10-CM | POA: Diagnosis not present

## 2023-01-11 DIAGNOSIS — R9389 Abnormal findings on diagnostic imaging of other specified body structures: Secondary | ICD-10-CM | POA: Diagnosis not present

## 2023-01-11 DIAGNOSIS — I6381 Other cerebral infarction due to occlusion or stenosis of small artery: Secondary | ICD-10-CM | POA: Diagnosis not present

## 2023-01-11 DIAGNOSIS — R531 Weakness: Secondary | ICD-10-CM | POA: Diagnosis not present

## 2023-01-11 DIAGNOSIS — I10 Essential (primary) hypertension: Secondary | ICD-10-CM | POA: Diagnosis not present

## 2023-01-11 LAB — URINALYSIS, ROUTINE W REFLEX MICROSCOPIC
Bacteria, UA: NONE SEEN
Bilirubin Urine: NEGATIVE
Glucose, UA: >=500 mg/dL — AB
Hgb urine dipstick: NEGATIVE
Ketones, ur: NEGATIVE mg/dL
Leukocytes,Ua: NEGATIVE
Nitrite: POSITIVE — AB
Protein, ur: NEGATIVE mg/dL
Specific Gravity, Urine: 1.027 (ref 1.005–1.030)
pH: 8 (ref 5.0–8.0)

## 2023-01-11 LAB — BASIC METABOLIC PANEL
Anion gap: 10 (ref 5–15)
BUN: 20 mg/dL (ref 8–23)
CO2: 24 mmol/L (ref 22–32)
Calcium: 9.2 mg/dL (ref 8.9–10.3)
Chloride: 102 mmol/L (ref 98–111)
Creatinine, Ser: 1.09 mg/dL (ref 0.61–1.24)
GFR, Estimated: >60 mL/min (ref >60)
Glucose, Bld: 156 mg/dL — ABNORMAL HIGH (ref 70–99)
Potassium: 4.1 mmol/L (ref 3.5–5.1)
Sodium: 136 mmol/L (ref 135–145)

## 2023-01-11 LAB — CBC WITH DIFFERENTIAL/PLATELET
Abs Immature Granulocytes: 0.03 10*3/uL (ref 0.00–0.07)
Basophils Absolute: 0.1 10*3/uL (ref 0.0–0.1)
Basophils Relative: 1 %
Eosinophils Absolute: 0.2 10*3/uL (ref 0.0–0.5)
Eosinophils Relative: 2 %
HCT: 44.9 % (ref 39.0–52.0)
Hemoglobin: 14.8 g/dL (ref 13.0–17.0)
Immature Granulocytes: 0 %
Lymphocytes Relative: 24 %
Lymphs Abs: 2.5 10*3/uL (ref 0.7–4.0)
MCH: 29.7 pg (ref 26.0–34.0)
MCHC: 33 g/dL (ref 30.0–36.0)
MCV: 90.2 fL (ref 80.0–100.0)
Monocytes Absolute: 1.2 10*3/uL — ABNORMAL HIGH (ref 0.1–1.0)
Monocytes Relative: 11 %
Neutro Abs: 6.6 10*3/uL (ref 1.7–7.7)
Neutrophils Relative %: 62 %
Platelets: 242 10*3/uL (ref 150–400)
RBC: 4.98 MIL/uL (ref 4.22–5.81)
RDW: 13.7 % (ref 11.5–15.5)
WBC: 10.6 10*3/uL — ABNORMAL HIGH (ref 4.0–10.5)
nRBC: 0 % (ref 0.0–0.2)

## 2023-01-11 LAB — TROPONIN I (HIGH SENSITIVITY): Troponin I (High Sensitivity): 14 ng/L (ref <18)

## 2023-01-11 MED ORDER — ALBUTEROL SULFATE HFA 108 (90 BASE) MCG/ACT IN AERS: 2.0000 | INHALATION_SPRAY | RESPIRATORY_TRACT | Status: DC | PRN

## 2023-01-11 NOTE — Discharge Instructions (Signed)
You were seen in the emergency department for worsening left arm numbness and some difficulty urinating.  Your urine was checked here and there were no clear sign of infection.  You also had a repeat MRI of your brain and they did not show any new event.  Please continue your regular medications and follow-up with your primary care doctor and neurology.  Return to the emergency department if any worsening or concerning symptoms

## 2023-01-11 NOTE — ED Triage Notes (Signed)
Pt BIB [facility] from home after waking up at 0300 this AM with increased left arm numbness and left leg. Pt is alert and oriented x 4. Pt found to be on 90% RA at home, EMS placed on 2 LPM and oxygen increased to 100%.  Hx of stroke on 01/08/2023 - left sided deficits of weakness  On Plavix  LNW 2200 on 01/10/2023

## 2023-01-11 NOTE — ED Notes (Signed)
Patient transported to MRI 

## 2023-01-11 NOTE — ED Provider Notes (Signed)
[facility] Provider Note   CSN: [id number] Arrival date & time: 01/11/23  3016     History  Chief Complaint  Patient presents with   Numbness   Shortness of Breath    Ricardo Le is a 82 y.o. male.  Patient is a poor historian.  He is brought in by ambulance from home after he reportedly woke up around 3 AM and noticed increased numbness of his left arm and leg.  He is not sure when it started but he also says he was recently admitted for stroke and had numbness in his left arm and left leg.  On review of medical records patient was just discharged 3 days ago after being admitted for stroke.  He denies headache chest pain abdominal pain blurry vision double vision.  He does endorse some intermittent shortness of breath, unclear if this is chronic or acute, states he has a injured diaphragm.   The history is provided by the patient and the EMS personnel.  Cerebrovascular Accident This is a recurrent problem. The problem occurs constantly. The problem has not changed since onset.Associated symptoms include shortness of breath. Pertinent negatives include no chest pain, no abdominal pain and no headaches. Nothing aggravates the symptoms. Nothing relieves the symptoms. He has tried nothing for the symptoms. The treatment provided no relief.       Home Medications Prior to Admission medications   Medication Sig Start Date End Date Taking? Authorizing Provider  acetaminophen (TYLENOL) 325 MG tablet Take 650 mg by mouth 2 (two) times daily as needed (pain).    [provider]  albuterol (VENTOLIN HFA) 108 (90 Base) MCG/ACT inhaler Inhale 2 puffs into the lungs every 6 (six) hours as needed for wheezing or shortness of breath. 06/13/22   [provider]  aspirin EC 81 MG tablet Take 81 mg by mouth daily.    [provider]  atorvastatin (LIPITOR) 80 MG tablet Take 1 tablet (80 mg total) by mouth daily. 01/09/23    Margaretmary Dys, MD  clopidogrel (PLAVIX) 75 MG tablet Take 1 tablet (75 mg total) by mouth daily. 01/09/23   Margaretmary Dys, MD  empagliflozin (JARDIANCE) 10 MG TABS tablet Take 10 mg by mouth daily.    [provider]  insulin glargine (LANTUS) 100 UNIT/ML Solostar Pen Inject 15 Units into the skin at bedtime. 01/08/23   Margaretmary Dys, MD  insulin lispro (ADMELOG) 100 UNIT/ML injection Inject 3 Units into the skin See admin instructions. Inject 3 units twice daily before lunch and supper.    [provider]  irbesartan (AVAPRO) 150 MG tablet Take 1 tablet (150 mg total) by mouth daily. 01/08/23 02/07/23  Margaretmary Dys, MD  Magnesium Hydroxide (MILK OF MAGNESIA PO) Take 30 mLs by mouth See admin instructions. Give 30 mL every morning. Repeat dose if no BM prior to lunch. Repeat dose once more if no BM before supper.    [provider]  Melatonin 10 MG TABS Take 10 mg by mouth at bedtime.    [provider]  metoprolol succinate (TOPROL-XL) 25 MG 24 hr tablet Take 0.5 tablets (12.5 mg total) by mouth daily. 01/09/23   Margaretmary Dys, MD  nitroGLYCERIN (NITROSTAT) 0.4 MG SL tablet DISSOLVE 1 TABLET UNDER THE TONGUE EVERY 5 MINUTES AS NEEDED FOR CHEST PAIN 02/09/22   Wendall Stade, MD  pantoprazole (PROTONIX) 40 MG tablet Take 40 mg  by mouth 2 (two) times daily. 02/27/20   [provider]  tamsulosin (FLOMAX) 0.4 MG CAPS capsule Take 1 capsule (0.4 mg total) by mouth daily after supper. Patient taking differently: Take 0.4 mg by mouth 2 (two) times daily. 07/30/18   Angiulli, Mcarthur Rossetti, PA-C  traMADol (ULTRAM) 50 MG tablet Take 50 mg by mouth 2 (two) times daily as needed for moderate pain.    [provider]  Wheat Dextrin (BENEFIBER PO) Take 1 Scoop by mouth See admin instructions. Give 1 Tablespoon mixed into drink every morning after breakfast.    [provider]      Allergies    Bentyl [dicyclomine], Crestor [rosuvastatin], Ranexa [ranolazine], Roxicodone [oxycodone], Ultram [tramadol], Iodinated contrast media, Iohexol, Ioversol, and Statins    Review of Systems   Review of Systems  Constitutional:  Negative for fever.  Respiratory:  Positive for shortness of breath.   Cardiovascular:  Negative for chest pain.  Gastrointestinal:  Negative for abdominal pain.  Neurological:  Negative for headaches.    Physical Exam Updated Vital Signs BP (!) 168/65 (BP Location: Left Arm)   Pulse (!) 48   Temp 97.8 F (36.6 C) (Oral)   Resp (!) 28   Ht 5\' 5"  (1.651 m)   Wt 70 kg   SpO2 100%   BMI 25.68 kg/m  Physical Exam Vitals and nursing note reviewed.  Constitutional:      General: He is not in acute distress.    Appearance: Normal appearance. He is well-developed.  HENT:     Head: Normocephalic and atraumatic.  Eyes:     Conjunctiva/sclera: Conjunctivae normal.  Cardiovascular:     Rate and Rhythm: Normal rate and regular rhythm.     Heart sounds: No murmur heard. Pulmonary:     Effort: Pulmonary effort is normal. No respiratory distress.     Breath sounds: Normal breath sounds.     Comments: Patient has episodes of more rapid shallow breathing intermittently. Abdominal:     Palpations: Abdomen is soft.     Tenderness: There is no abdominal tenderness.  Musculoskeletal:        General: No swelling. Normal range of motion.     Cervical back: Neck supple.     Right lower leg: No tenderness. No edema.     Left lower leg: No tenderness. No edema.  Skin:    General: Skin is warm and dry.     Capillary Refill: Capillary refill takes less than 2 seconds.  Neurological:     Mental Status: He is alert.     Cranial Nerves: No cranial nerve deficit.     Sensory: Sensory deficit present.     Motor: Weakness present.     Comments: Patient is awake and alert.  He is not oriented to date.  He has no gross facial asymmetry or  slurred speech.  Upper extremity strength is 5 out of 5 bilaterally with decreased sensation of left arm.  Lower extremity strength is 4 out of 5 on the left and 5 out of 5 on the right with some decreased sensory on the left leg.     ED Results / Procedures / Treatments   Labs (all labs ordered are listed, but only abnormal results are displayed) Labs Reviewed  BASIC METABOLIC PANEL - Abnormal; Notable for the following components:      Result Value   Glucose, Bld 156 (*)    All other components within normal limits  CBC WITH DIFFERENTIAL/PLATELET -  Abnormal; Notable for the following components:   WBC 10.6 (*)    Monocytes Absolute 1.2 (*)    All other components within normal limits  URINALYSIS, ROUTINE W REFLEX MICROSCOPIC - Abnormal; Notable for the following components:   Color, Urine AMBER (*)    Glucose, UA >=500 (*)    Nitrite POSITIVE (*)    All other components within normal limits  TROPONIN I (HIGH SENSITIVITY)    EKG EKG Interpretation Date/Time:  Thursday January 11 2023 07:14:07 EST Ventricular Rate:  49 PR Interval:  278 QRS Duration:  109 QT Interval:  460 QTC Calculation: 416 R Axis:   264  Text Interpretation: Sinus bradycardia Prolonged PR interval Right atrial enlargement Probable RVH w/ secondary repol abnormality Inferior infarct, old Lateral leads are also involved No significant change since last tracing Confirmed by Meridee Score [phone]) on 01/11/2023 7:18:14 AM  Radiology MR BRAIN WO CONTRAST  Result Date: 01/11/2023 CLINICAL DATA:  Increased numbness in the left arm and leg. EXAM: MRI HEAD WITHOUT CONTRAST TECHNIQUE: Multiplanar, multiecho pulse sequences of the brain and surrounding structures were obtained without intravenous contrast. COMPARISON:  Head CT from earlier today and brain MRI from 5 days prior FINDINGS: Brain: Acute to early subacute lacunar infarct in the right thalamus is arguably larger than before but this could easily be from  slice selection. No new insult or hemorrhage. Chronic small vessel ischemic gliosis which is extensive in the cerebral white matter. Small chronic infarct at the right frontal parietal junction centered at the subcortical white matter. Chronic right paramedian pontine infarct. Cerebral volume loss with ventriculomegaly. Mass in the right paramedian pituitary gland measuring 10 mm. Clear suprasellar cistern. Vascular: Major flow voids are preserved Skull and upper cervical spine: Normal marrow signal Sinuses/Orbits: No acute finding.  Bilateral cataract resection. IMPRESSION: 1. Recent lacunar infarct in the right thalamus, known from scan 5 days ago. No new abnormality. 2. Chronic small vessel ischemic injury as described. 3. Right pituitary lesion measuring 1 cm, likely adenoma. No further imaging evaluation or follow-up is necessary. Consider endocrine function tests and correlate for history of pituitary hypersecretion. This follows ACR consensus guidelines: Management of Incidental Pituitary Findings on CT, MRI and F18-FDG PET: A White Paper of the ACR Incidental Findings Committee. J Am Coll Radiol 2018; 15: 403-47. Electronically Signed   By: Tiburcio Pea M.D.   On: 01/11/2023 10:18   CT Head Wo Contrast  Result Date: 01/11/2023 CLINICAL DATA:  Increased left arm and left leg numbness and weakness since early this morning. Neuro deficit, acute, stroke suspected EXAM: CT HEAD WITHOUT CONTRAST TECHNIQUE: Contiguous axial images were obtained from the base of the skull through the vertex without intravenous contrast. RADIATION DOSE REDUCTION: This exam was performed according to the departmental dose-optimization program which includes automated exposure control, adjustment of the mA and/or kV according to patient size and/or use of iterative reconstruction technique. COMPARISON:  01/06/2023. FINDINGS: Brain: Focus of hypoattenuation in the posterior right thalamus corresponds to the recent infarct noted  on the brain MRI dated 01/06/2023. No evidence of a new infarct. No parenchymal or extra-axial masses or mass effect. No hydrocephalus. No intracranial hemorrhage. Vascular: Dense calcification along the intracranial internal carotid arteries, unchanged. No hyperattenuating skull base vessel. Skull: Normal. Negative for fracture or focal lesion. Sinuses/Orbits: No acute finding. Other: None. IMPRESSION: 1. 1. No acute intracranial abnormalities. 2. Recent infarct in the posterior right thalamus, as noted on the brain MRI dated 01/06/2023. Electronically Signed  By: Amie Portland M.D.   On: 01/11/2023 09:02   DG Chest Portable 1 View  Result Date: 01/11/2023 CLINICAL DATA:  Shortness of breath. EXAM: PORTABLE CHEST 1 VIEW COMPARISON:  Chest CT 05/19/2022. FINDINGS: The lungs are expiratory with bilateral basilar atelectatic bands and an asymmetrically elevated right hemidiaphragm. The lungs are clear of focal infiltrates as far as seen, but the lung bases are obscured by the low lung volumes. There are sternotomy and CABG changes. Cardiac size is normal. The mediastinum is normally outlined. There is heavy aortic calcification. Partially visible lower cervical fusion hardware. No new osseous abnormality is seen. There is thoracic spondylosis. Overlying monitor wires. IMPRESSION: 1. Expiratory chest with bilateral basilar atelectatic bands and an asymmetrically elevated right hemidiaphragm. 2. No evidence of acute chest disease with limited view of the bases. 3. Aortic atherosclerosis. Electronically Signed   By: Almira Bar M.D.   On: 01/11/2023 07:51    Procedures Procedures    Medications Ordered in ED Medications  albuterol (VENTOLIN HFA) 108 (90 Base) MCG/ACT inhaler 2 puff (has no administration in time range)    ED Course/ Medical Decision Making/ A&P Clinical Course as of 01/11/23 1703  Thu Jan 11, 2023  0915 Reviewed case with neurology on-call Dr. Derry Lory.  He is recommending  getting the patient an MRI to see if he has had another event.  Also consider PT OT if he is having any gait or balance problems. [MB]  1042 I reached the patient's wife and updated her.  She said her son can come pick him up. [MB]  1154 Patient's son is here now and he said he is at his baseline now.  They are comfortable plan for discharge. [MB]    Clinical Course User Index [MB] Terrilee Files, MD                                 Medical Decision Making Amount and/or Complexity of Data Reviewed Labs: ordered. Radiology: ordered.  Risk Prescription drug management.   This patient complains of left arm numbness, difficulty urinating, possible shortness of breath; this involves an extensive number of treatment Options and is a complaint that carries with it a high risk of complications and morbidity. The differential includes stroke, retention, UTI, recrudescence  I ordered, reviewed and interpreted labs, which included CBC with mildly elevated white count stable hemoglobin, chemistries with elevated glucose, troponin unremarkable, urinalysis without clear signs of infection I ordered imaging studies which included CT head and MRI brain and I independently    visualized and interpreted imaging which showed no acute findings Additional history obtained from EMS Previous records obtained and reviewed in epic including recent neurology and discharge summary notes I consulted neurology Dr. Derry Lory And discussed lab and imaging findings and discussed disposition.  Cardiac monitoring reviewed, sinus rhythm Social determinants considered, no significant barriers Critical Interventions: None  After the interventions stated above, I reevaluated the patient and found patient to be feeling better and in no distress Admission and further testing considered, no indications for admission or further workup at this time.  Patient's son is here and I reviewed return instructions with  him         Final Clinical Impression(s) / ED Diagnoses Final diagnoses:  Left arm numbness  Difficulty urinating    Rx / DC Orders ED Discharge Orders     None  Terrilee Files, MD 01/11/23 [phone]

## 2023-01-16 ENCOUNTER — Ambulatory Visit: Payer: Medicare HMO | Admitting: Neurology

## 2023-01-17 DIAGNOSIS — F419 Anxiety disorder, unspecified: Secondary | ICD-10-CM | POA: Diagnosis not present

## 2023-01-17 DIAGNOSIS — I5020 Unspecified systolic (congestive) heart failure: Secondary | ICD-10-CM | POA: Diagnosis not present

## 2023-01-17 DIAGNOSIS — G4700 Insomnia, unspecified: Secondary | ICD-10-CM | POA: Diagnosis not present

## 2023-01-17 DIAGNOSIS — Z8701 Personal history of pneumonia (recurrent): Secondary | ICD-10-CM | POA: Diagnosis not present

## 2023-01-17 DIAGNOSIS — Z794 Long term (current) use of insulin: Secondary | ICD-10-CM | POA: Diagnosis not present

## 2023-01-17 DIAGNOSIS — N39498 Other specified urinary incontinence: Secondary | ICD-10-CM | POA: Diagnosis not present

## 2023-01-17 DIAGNOSIS — Z7902 Long term (current) use of antithrombotics/antiplatelets: Secondary | ICD-10-CM | POA: Diagnosis not present

## 2023-01-17 DIAGNOSIS — I110 Hypertensive heart disease with heart failure: Secondary | ICD-10-CM | POA: Diagnosis not present

## 2023-01-17 DIAGNOSIS — E1151 Type 2 diabetes mellitus with diabetic peripheral angiopathy without gangrene: Secondary | ICD-10-CM | POA: Diagnosis not present

## 2023-01-17 DIAGNOSIS — N401 Enlarged prostate with lower urinary tract symptoms: Secondary | ICD-10-CM | POA: Diagnosis not present

## 2023-01-17 DIAGNOSIS — Z7982 Long term (current) use of aspirin: Secondary | ICD-10-CM | POA: Diagnosis not present

## 2023-01-17 DIAGNOSIS — K219 Gastro-esophageal reflux disease without esophagitis: Secondary | ICD-10-CM | POA: Diagnosis not present

## 2023-01-17 DIAGNOSIS — E7800 Pure hypercholesterolemia, unspecified: Secondary | ICD-10-CM | POA: Diagnosis not present

## 2023-01-17 DIAGNOSIS — Z90410 Acquired total absence of pancreas: Secondary | ICD-10-CM | POA: Diagnosis not present

## 2023-01-17 DIAGNOSIS — Z7984 Long term (current) use of oral hypoglycemic drugs: Secondary | ICD-10-CM | POA: Diagnosis not present

## 2023-01-17 DIAGNOSIS — I2510 Atherosclerotic heart disease of native coronary artery without angina pectoris: Secondary | ICD-10-CM | POA: Diagnosis not present

## 2023-01-17 DIAGNOSIS — Z8507 Personal history of malignant neoplasm of pancreas: Secondary | ICD-10-CM | POA: Diagnosis not present

## 2023-01-17 DIAGNOSIS — I69354 Hemiplegia and hemiparesis following cerebral infarction affecting left non-dominant side: Secondary | ICD-10-CM | POA: Diagnosis not present

## 2023-01-17 DIAGNOSIS — F1120 Opioid dependence, uncomplicated: Secondary | ICD-10-CM | POA: Diagnosis not present

## 2023-01-17 DIAGNOSIS — Z87442 Personal history of urinary calculi: Secondary | ICD-10-CM | POA: Diagnosis not present

## 2023-01-17 DIAGNOSIS — Z951 Presence of aortocoronary bypass graft: Secondary | ICD-10-CM | POA: Diagnosis not present

## 2023-01-17 DIAGNOSIS — Z86718 Personal history of other venous thrombosis and embolism: Secondary | ICD-10-CM | POA: Diagnosis not present

## 2023-01-17 DIAGNOSIS — H9190 Unspecified hearing loss, unspecified ear: Secondary | ICD-10-CM | POA: Diagnosis not present

## 2023-01-17 DIAGNOSIS — Z9081 Acquired absence of spleen: Secondary | ICD-10-CM | POA: Diagnosis not present

## 2023-01-18 DIAGNOSIS — Z9081 Acquired absence of spleen: Secondary | ICD-10-CM | POA: Diagnosis not present

## 2023-01-18 DIAGNOSIS — N39498 Other specified urinary incontinence: Secondary | ICD-10-CM | POA: Diagnosis not present

## 2023-01-18 DIAGNOSIS — I2510 Atherosclerotic heart disease of native coronary artery without angina pectoris: Secondary | ICD-10-CM | POA: Diagnosis not present

## 2023-01-18 DIAGNOSIS — Z8701 Personal history of pneumonia (recurrent): Secondary | ICD-10-CM | POA: Diagnosis not present

## 2023-01-18 DIAGNOSIS — F1120 Opioid dependence, uncomplicated: Secondary | ICD-10-CM | POA: Diagnosis not present

## 2023-01-18 DIAGNOSIS — N401 Enlarged prostate with lower urinary tract symptoms: Secondary | ICD-10-CM | POA: Diagnosis not present

## 2023-01-18 DIAGNOSIS — Z86718 Personal history of other venous thrombosis and embolism: Secondary | ICD-10-CM | POA: Diagnosis not present

## 2023-01-18 DIAGNOSIS — F419 Anxiety disorder, unspecified: Secondary | ICD-10-CM | POA: Diagnosis not present

## 2023-01-18 DIAGNOSIS — G4700 Insomnia, unspecified: Secondary | ICD-10-CM | POA: Diagnosis not present

## 2023-01-18 DIAGNOSIS — E7800 Pure hypercholesterolemia, unspecified: Secondary | ICD-10-CM | POA: Diagnosis not present

## 2023-01-18 DIAGNOSIS — E1151 Type 2 diabetes mellitus with diabetic peripheral angiopathy without gangrene: Secondary | ICD-10-CM | POA: Diagnosis not present

## 2023-01-18 DIAGNOSIS — Z7902 Long term (current) use of antithrombotics/antiplatelets: Secondary | ICD-10-CM | POA: Diagnosis not present

## 2023-01-18 DIAGNOSIS — I69354 Hemiplegia and hemiparesis following cerebral infarction affecting left non-dominant side: Secondary | ICD-10-CM | POA: Diagnosis not present

## 2023-01-18 DIAGNOSIS — H9190 Unspecified hearing loss, unspecified ear: Secondary | ICD-10-CM | POA: Diagnosis not present

## 2023-01-18 DIAGNOSIS — Z7982 Long term (current) use of aspirin: Secondary | ICD-10-CM | POA: Diagnosis not present

## 2023-01-18 DIAGNOSIS — Z90410 Acquired total absence of pancreas: Secondary | ICD-10-CM | POA: Diagnosis not present

## 2023-01-18 DIAGNOSIS — K219 Gastro-esophageal reflux disease without esophagitis: Secondary | ICD-10-CM | POA: Diagnosis not present

## 2023-01-18 DIAGNOSIS — I5020 Unspecified systolic (congestive) heart failure: Secondary | ICD-10-CM | POA: Diagnosis not present

## 2023-01-18 DIAGNOSIS — I110 Hypertensive heart disease with heart failure: Secondary | ICD-10-CM | POA: Diagnosis not present

## 2023-01-18 DIAGNOSIS — Z951 Presence of aortocoronary bypass graft: Secondary | ICD-10-CM | POA: Diagnosis not present

## 2023-01-18 DIAGNOSIS — Z7984 Long term (current) use of oral hypoglycemic drugs: Secondary | ICD-10-CM | POA: Diagnosis not present

## 2023-01-18 DIAGNOSIS — Z87442 Personal history of urinary calculi: Secondary | ICD-10-CM | POA: Diagnosis not present

## 2023-01-18 DIAGNOSIS — Z8507 Personal history of malignant neoplasm of pancreas: Secondary | ICD-10-CM | POA: Diagnosis not present

## 2023-01-18 DIAGNOSIS — Z794 Long term (current) use of insulin: Secondary | ICD-10-CM | POA: Diagnosis not present

## 2023-01-24 DIAGNOSIS — E1165 Type 2 diabetes mellitus with hyperglycemia: Secondary | ICD-10-CM | POA: Diagnosis not present

## 2023-01-24 DIAGNOSIS — R5381 Other malaise: Secondary | ICD-10-CM | POA: Diagnosis not present

## 2023-01-24 DIAGNOSIS — I639 Cerebral infarction, unspecified: Secondary | ICD-10-CM | POA: Diagnosis not present

## 2023-01-25 DIAGNOSIS — I5020 Unspecified systolic (congestive) heart failure: Secondary | ICD-10-CM | POA: Diagnosis not present

## 2023-01-25 DIAGNOSIS — E7800 Pure hypercholesterolemia, unspecified: Secondary | ICD-10-CM | POA: Diagnosis not present

## 2023-01-25 DIAGNOSIS — Z90410 Acquired total absence of pancreas: Secondary | ICD-10-CM | POA: Diagnosis not present

## 2023-01-25 DIAGNOSIS — N39498 Other specified urinary incontinence: Secondary | ICD-10-CM | POA: Diagnosis not present

## 2023-01-25 DIAGNOSIS — H9190 Unspecified hearing loss, unspecified ear: Secondary | ICD-10-CM | POA: Diagnosis not present

## 2023-01-25 DIAGNOSIS — Z951 Presence of aortocoronary bypass graft: Secondary | ICD-10-CM | POA: Diagnosis not present

## 2023-01-25 DIAGNOSIS — N401 Enlarged prostate with lower urinary tract symptoms: Secondary | ICD-10-CM | POA: Diagnosis not present

## 2023-01-25 DIAGNOSIS — I110 Hypertensive heart disease with heart failure: Secondary | ICD-10-CM | POA: Diagnosis not present

## 2023-01-25 DIAGNOSIS — I2510 Atherosclerotic heart disease of native coronary artery without angina pectoris: Secondary | ICD-10-CM | POA: Diagnosis not present

## 2023-01-25 DIAGNOSIS — Z9081 Acquired absence of spleen: Secondary | ICD-10-CM | POA: Diagnosis not present

## 2023-01-25 DIAGNOSIS — Z7982 Long term (current) use of aspirin: Secondary | ICD-10-CM | POA: Diagnosis not present

## 2023-01-25 DIAGNOSIS — Z8701 Personal history of pneumonia (recurrent): Secondary | ICD-10-CM | POA: Diagnosis not present

## 2023-01-25 DIAGNOSIS — Z794 Long term (current) use of insulin: Secondary | ICD-10-CM | POA: Diagnosis not present

## 2023-01-25 DIAGNOSIS — Z7902 Long term (current) use of antithrombotics/antiplatelets: Secondary | ICD-10-CM | POA: Diagnosis not present

## 2023-01-25 DIAGNOSIS — K219 Gastro-esophageal reflux disease without esophagitis: Secondary | ICD-10-CM | POA: Diagnosis not present

## 2023-01-25 DIAGNOSIS — F1120 Opioid dependence, uncomplicated: Secondary | ICD-10-CM | POA: Diagnosis not present

## 2023-01-25 DIAGNOSIS — Z86718 Personal history of other venous thrombosis and embolism: Secondary | ICD-10-CM | POA: Diagnosis not present

## 2023-01-25 DIAGNOSIS — G4700 Insomnia, unspecified: Secondary | ICD-10-CM | POA: Diagnosis not present

## 2023-01-25 DIAGNOSIS — Z8507 Personal history of malignant neoplasm of pancreas: Secondary | ICD-10-CM | POA: Diagnosis not present

## 2023-01-25 DIAGNOSIS — E1151 Type 2 diabetes mellitus with diabetic peripheral angiopathy without gangrene: Secondary | ICD-10-CM | POA: Diagnosis not present

## 2023-01-25 DIAGNOSIS — F419 Anxiety disorder, unspecified: Secondary | ICD-10-CM | POA: Diagnosis not present

## 2023-01-25 DIAGNOSIS — Z87442 Personal history of urinary calculi: Secondary | ICD-10-CM | POA: Diagnosis not present

## 2023-01-25 DIAGNOSIS — Z7984 Long term (current) use of oral hypoglycemic drugs: Secondary | ICD-10-CM | POA: Diagnosis not present

## 2023-01-25 DIAGNOSIS — I69354 Hemiplegia and hemiparesis following cerebral infarction affecting left non-dominant side: Secondary | ICD-10-CM | POA: Diagnosis not present

## 2023-02-01 DIAGNOSIS — Z8701 Personal history of pneumonia (recurrent): Secondary | ICD-10-CM | POA: Diagnosis not present

## 2023-02-01 DIAGNOSIS — Z86718 Personal history of other venous thrombosis and embolism: Secondary | ICD-10-CM | POA: Diagnosis not present

## 2023-02-01 DIAGNOSIS — Z7902 Long term (current) use of antithrombotics/antiplatelets: Secondary | ICD-10-CM | POA: Diagnosis not present

## 2023-02-01 DIAGNOSIS — E1151 Type 2 diabetes mellitus with diabetic peripheral angiopathy without gangrene: Secondary | ICD-10-CM | POA: Diagnosis not present

## 2023-02-01 DIAGNOSIS — I2510 Atherosclerotic heart disease of native coronary artery without angina pectoris: Secondary | ICD-10-CM | POA: Diagnosis not present

## 2023-02-01 DIAGNOSIS — N401 Enlarged prostate with lower urinary tract symptoms: Secondary | ICD-10-CM | POA: Diagnosis not present

## 2023-02-01 DIAGNOSIS — Z794 Long term (current) use of insulin: Secondary | ICD-10-CM | POA: Diagnosis not present

## 2023-02-01 DIAGNOSIS — Z951 Presence of aortocoronary bypass graft: Secondary | ICD-10-CM | POA: Diagnosis not present

## 2023-02-01 DIAGNOSIS — Z9081 Acquired absence of spleen: Secondary | ICD-10-CM | POA: Diagnosis not present

## 2023-02-01 DIAGNOSIS — K219 Gastro-esophageal reflux disease without esophagitis: Secondary | ICD-10-CM | POA: Diagnosis not present

## 2023-02-01 DIAGNOSIS — N39498 Other specified urinary incontinence: Secondary | ICD-10-CM | POA: Diagnosis not present

## 2023-02-01 DIAGNOSIS — F419 Anxiety disorder, unspecified: Secondary | ICD-10-CM | POA: Diagnosis not present

## 2023-02-01 DIAGNOSIS — Z90410 Acquired total absence of pancreas: Secondary | ICD-10-CM | POA: Diagnosis not present

## 2023-02-01 DIAGNOSIS — Z7984 Long term (current) use of oral hypoglycemic drugs: Secondary | ICD-10-CM | POA: Diagnosis not present

## 2023-02-01 DIAGNOSIS — E7800 Pure hypercholesterolemia, unspecified: Secondary | ICD-10-CM | POA: Diagnosis not present

## 2023-02-01 DIAGNOSIS — Z8507 Personal history of malignant neoplasm of pancreas: Secondary | ICD-10-CM | POA: Diagnosis not present

## 2023-02-01 DIAGNOSIS — Z7982 Long term (current) use of aspirin: Secondary | ICD-10-CM | POA: Diagnosis not present

## 2023-02-01 DIAGNOSIS — I110 Hypertensive heart disease with heart failure: Secondary | ICD-10-CM | POA: Diagnosis not present

## 2023-02-01 DIAGNOSIS — G4700 Insomnia, unspecified: Secondary | ICD-10-CM | POA: Diagnosis not present

## 2023-02-01 DIAGNOSIS — I69354 Hemiplegia and hemiparesis following cerebral infarction affecting left non-dominant side: Secondary | ICD-10-CM | POA: Diagnosis not present

## 2023-02-01 DIAGNOSIS — Z87442 Personal history of urinary calculi: Secondary | ICD-10-CM | POA: Diagnosis not present

## 2023-02-01 DIAGNOSIS — F1120 Opioid dependence, uncomplicated: Secondary | ICD-10-CM | POA: Diagnosis not present

## 2023-02-01 DIAGNOSIS — I5020 Unspecified systolic (congestive) heart failure: Secondary | ICD-10-CM | POA: Diagnosis not present

## 2023-02-01 DIAGNOSIS — H9190 Unspecified hearing loss, unspecified ear: Secondary | ICD-10-CM | POA: Diagnosis not present

## 2023-02-02 DIAGNOSIS — G4700 Insomnia, unspecified: Secondary | ICD-10-CM | POA: Diagnosis not present

## 2023-02-02 DIAGNOSIS — Z90410 Acquired total absence of pancreas: Secondary | ICD-10-CM | POA: Diagnosis not present

## 2023-02-02 DIAGNOSIS — F1120 Opioid dependence, uncomplicated: Secondary | ICD-10-CM | POA: Diagnosis not present

## 2023-02-02 DIAGNOSIS — I69354 Hemiplegia and hemiparesis following cerebral infarction affecting left non-dominant side: Secondary | ICD-10-CM | POA: Diagnosis not present

## 2023-02-02 DIAGNOSIS — H9190 Unspecified hearing loss, unspecified ear: Secondary | ICD-10-CM | POA: Diagnosis not present

## 2023-02-02 DIAGNOSIS — F419 Anxiety disorder, unspecified: Secondary | ICD-10-CM | POA: Diagnosis not present

## 2023-02-02 DIAGNOSIS — Z9081 Acquired absence of spleen: Secondary | ICD-10-CM | POA: Diagnosis not present

## 2023-02-02 DIAGNOSIS — Z87442 Personal history of urinary calculi: Secondary | ICD-10-CM | POA: Diagnosis not present

## 2023-02-02 DIAGNOSIS — N39498 Other specified urinary incontinence: Secondary | ICD-10-CM | POA: Diagnosis not present

## 2023-02-02 DIAGNOSIS — I5020 Unspecified systolic (congestive) heart failure: Secondary | ICD-10-CM | POA: Diagnosis not present

## 2023-02-02 DIAGNOSIS — Z8507 Personal history of malignant neoplasm of pancreas: Secondary | ICD-10-CM | POA: Diagnosis not present

## 2023-02-02 DIAGNOSIS — Z7902 Long term (current) use of antithrombotics/antiplatelets: Secondary | ICD-10-CM | POA: Diagnosis not present

## 2023-02-02 DIAGNOSIS — Z794 Long term (current) use of insulin: Secondary | ICD-10-CM | POA: Diagnosis not present

## 2023-02-02 DIAGNOSIS — Z951 Presence of aortocoronary bypass graft: Secondary | ICD-10-CM | POA: Diagnosis not present

## 2023-02-02 DIAGNOSIS — Z7982 Long term (current) use of aspirin: Secondary | ICD-10-CM | POA: Diagnosis not present

## 2023-02-02 DIAGNOSIS — E7800 Pure hypercholesterolemia, unspecified: Secondary | ICD-10-CM | POA: Diagnosis not present

## 2023-02-02 DIAGNOSIS — Z86718 Personal history of other venous thrombosis and embolism: Secondary | ICD-10-CM | POA: Diagnosis not present

## 2023-02-02 DIAGNOSIS — I110 Hypertensive heart disease with heart failure: Secondary | ICD-10-CM | POA: Diagnosis not present

## 2023-02-02 DIAGNOSIS — N401 Enlarged prostate with lower urinary tract symptoms: Secondary | ICD-10-CM | POA: Diagnosis not present

## 2023-02-02 DIAGNOSIS — E1151 Type 2 diabetes mellitus with diabetic peripheral angiopathy without gangrene: Secondary | ICD-10-CM | POA: Diagnosis not present

## 2023-02-02 DIAGNOSIS — Z8701 Personal history of pneumonia (recurrent): Secondary | ICD-10-CM | POA: Diagnosis not present

## 2023-02-02 DIAGNOSIS — K219 Gastro-esophageal reflux disease without esophagitis: Secondary | ICD-10-CM | POA: Diagnosis not present

## 2023-02-02 DIAGNOSIS — Z7984 Long term (current) use of oral hypoglycemic drugs: Secondary | ICD-10-CM | POA: Diagnosis not present

## 2023-02-02 DIAGNOSIS — I2510 Atherosclerotic heart disease of native coronary artery without angina pectoris: Secondary | ICD-10-CM | POA: Diagnosis not present

## 2023-02-05 ENCOUNTER — Other Ambulatory Visit (HOSPITAL_COMMUNITY): Payer: Self-pay

## 2023-02-06 DIAGNOSIS — K219 Gastro-esophageal reflux disease without esophagitis: Secondary | ICD-10-CM | POA: Diagnosis not present

## 2023-02-06 DIAGNOSIS — Z9081 Acquired absence of spleen: Secondary | ICD-10-CM | POA: Diagnosis not present

## 2023-02-06 DIAGNOSIS — G4700 Insomnia, unspecified: Secondary | ICD-10-CM | POA: Diagnosis not present

## 2023-02-06 DIAGNOSIS — Z794 Long term (current) use of insulin: Secondary | ICD-10-CM | POA: Diagnosis not present

## 2023-02-06 DIAGNOSIS — Z7902 Long term (current) use of antithrombotics/antiplatelets: Secondary | ICD-10-CM | POA: Diagnosis not present

## 2023-02-06 DIAGNOSIS — Z951 Presence of aortocoronary bypass graft: Secondary | ICD-10-CM | POA: Diagnosis not present

## 2023-02-06 DIAGNOSIS — Z8507 Personal history of malignant neoplasm of pancreas: Secondary | ICD-10-CM | POA: Diagnosis not present

## 2023-02-06 DIAGNOSIS — I69354 Hemiplegia and hemiparesis following cerebral infarction affecting left non-dominant side: Secondary | ICD-10-CM | POA: Diagnosis not present

## 2023-02-06 DIAGNOSIS — E7800 Pure hypercholesterolemia, unspecified: Secondary | ICD-10-CM | POA: Diagnosis not present

## 2023-02-06 DIAGNOSIS — I5020 Unspecified systolic (congestive) heart failure: Secondary | ICD-10-CM | POA: Diagnosis not present

## 2023-02-06 DIAGNOSIS — E1151 Type 2 diabetes mellitus with diabetic peripheral angiopathy without gangrene: Secondary | ICD-10-CM | POA: Diagnosis not present

## 2023-02-06 DIAGNOSIS — Z7984 Long term (current) use of oral hypoglycemic drugs: Secondary | ICD-10-CM | POA: Diagnosis not present

## 2023-02-06 DIAGNOSIS — Z86718 Personal history of other venous thrombosis and embolism: Secondary | ICD-10-CM | POA: Diagnosis not present

## 2023-02-06 DIAGNOSIS — N39498 Other specified urinary incontinence: Secondary | ICD-10-CM | POA: Diagnosis not present

## 2023-02-06 DIAGNOSIS — Z87442 Personal history of urinary calculi: Secondary | ICD-10-CM | POA: Diagnosis not present

## 2023-02-06 DIAGNOSIS — Z7982 Long term (current) use of aspirin: Secondary | ICD-10-CM | POA: Diagnosis not present

## 2023-02-06 DIAGNOSIS — F1120 Opioid dependence, uncomplicated: Secondary | ICD-10-CM | POA: Diagnosis not present

## 2023-02-06 DIAGNOSIS — I110 Hypertensive heart disease with heart failure: Secondary | ICD-10-CM | POA: Diagnosis not present

## 2023-02-06 DIAGNOSIS — I2510 Atherosclerotic heart disease of native coronary artery without angina pectoris: Secondary | ICD-10-CM | POA: Diagnosis not present

## 2023-02-06 DIAGNOSIS — Z8701 Personal history of pneumonia (recurrent): Secondary | ICD-10-CM | POA: Diagnosis not present

## 2023-02-06 DIAGNOSIS — Z90410 Acquired total absence of pancreas: Secondary | ICD-10-CM | POA: Diagnosis not present

## 2023-02-06 DIAGNOSIS — F419 Anxiety disorder, unspecified: Secondary | ICD-10-CM | POA: Diagnosis not present

## 2023-02-06 DIAGNOSIS — H9190 Unspecified hearing loss, unspecified ear: Secondary | ICD-10-CM | POA: Diagnosis not present

## 2023-02-06 DIAGNOSIS — N401 Enlarged prostate with lower urinary tract symptoms: Secondary | ICD-10-CM | POA: Diagnosis not present

## 2023-02-08 DIAGNOSIS — K219 Gastro-esophageal reflux disease without esophagitis: Secondary | ICD-10-CM | POA: Diagnosis not present

## 2023-02-08 DIAGNOSIS — Z8507 Personal history of malignant neoplasm of pancreas: Secondary | ICD-10-CM | POA: Diagnosis not present

## 2023-02-08 DIAGNOSIS — N401 Enlarged prostate with lower urinary tract symptoms: Secondary | ICD-10-CM | POA: Diagnosis not present

## 2023-02-08 DIAGNOSIS — G4700 Insomnia, unspecified: Secondary | ICD-10-CM | POA: Diagnosis not present

## 2023-02-08 DIAGNOSIS — E7800 Pure hypercholesterolemia, unspecified: Secondary | ICD-10-CM | POA: Diagnosis not present

## 2023-02-08 DIAGNOSIS — H9190 Unspecified hearing loss, unspecified ear: Secondary | ICD-10-CM | POA: Diagnosis not present

## 2023-02-08 DIAGNOSIS — Z9081 Acquired absence of spleen: Secondary | ICD-10-CM | POA: Diagnosis not present

## 2023-02-08 DIAGNOSIS — E1151 Type 2 diabetes mellitus with diabetic peripheral angiopathy without gangrene: Secondary | ICD-10-CM | POA: Diagnosis not present

## 2023-02-08 DIAGNOSIS — N39498 Other specified urinary incontinence: Secondary | ICD-10-CM | POA: Diagnosis not present

## 2023-02-08 DIAGNOSIS — Z951 Presence of aortocoronary bypass graft: Secondary | ICD-10-CM | POA: Diagnosis not present

## 2023-02-08 DIAGNOSIS — I69354 Hemiplegia and hemiparesis following cerebral infarction affecting left non-dominant side: Secondary | ICD-10-CM | POA: Diagnosis not present

## 2023-02-08 DIAGNOSIS — F419 Anxiety disorder, unspecified: Secondary | ICD-10-CM | POA: Diagnosis not present

## 2023-02-08 DIAGNOSIS — Z794 Long term (current) use of insulin: Secondary | ICD-10-CM | POA: Diagnosis not present

## 2023-02-08 DIAGNOSIS — Z8701 Personal history of pneumonia (recurrent): Secondary | ICD-10-CM | POA: Diagnosis not present

## 2023-02-08 DIAGNOSIS — I5020 Unspecified systolic (congestive) heart failure: Secondary | ICD-10-CM | POA: Diagnosis not present

## 2023-02-08 DIAGNOSIS — F1120 Opioid dependence, uncomplicated: Secondary | ICD-10-CM | POA: Diagnosis not present

## 2023-02-08 DIAGNOSIS — I110 Hypertensive heart disease with heart failure: Secondary | ICD-10-CM | POA: Diagnosis not present

## 2023-02-08 DIAGNOSIS — Z86718 Personal history of other venous thrombosis and embolism: Secondary | ICD-10-CM | POA: Diagnosis not present

## 2023-02-08 DIAGNOSIS — Z87442 Personal history of urinary calculi: Secondary | ICD-10-CM | POA: Diagnosis not present

## 2023-02-08 DIAGNOSIS — Z7984 Long term (current) use of oral hypoglycemic drugs: Secondary | ICD-10-CM | POA: Diagnosis not present

## 2023-02-08 DIAGNOSIS — I2510 Atherosclerotic heart disease of native coronary artery without angina pectoris: Secondary | ICD-10-CM | POA: Diagnosis not present

## 2023-02-08 DIAGNOSIS — Z90410 Acquired total absence of pancreas: Secondary | ICD-10-CM | POA: Diagnosis not present

## 2023-02-08 DIAGNOSIS — Z7902 Long term (current) use of antithrombotics/antiplatelets: Secondary | ICD-10-CM | POA: Diagnosis not present

## 2023-02-08 DIAGNOSIS — Z7982 Long term (current) use of aspirin: Secondary | ICD-10-CM | POA: Diagnosis not present

## 2023-02-13 DIAGNOSIS — Z8701 Personal history of pneumonia (recurrent): Secondary | ICD-10-CM | POA: Diagnosis not present

## 2023-02-13 DIAGNOSIS — F1120 Opioid dependence, uncomplicated: Secondary | ICD-10-CM | POA: Diagnosis not present

## 2023-02-13 DIAGNOSIS — I69354 Hemiplegia and hemiparesis following cerebral infarction affecting left non-dominant side: Secondary | ICD-10-CM | POA: Diagnosis not present

## 2023-02-13 DIAGNOSIS — Z7982 Long term (current) use of aspirin: Secondary | ICD-10-CM | POA: Diagnosis not present

## 2023-02-13 DIAGNOSIS — N39498 Other specified urinary incontinence: Secondary | ICD-10-CM | POA: Diagnosis not present

## 2023-02-13 DIAGNOSIS — E1165 Type 2 diabetes mellitus with hyperglycemia: Secondary | ICD-10-CM | POA: Diagnosis not present

## 2023-02-13 DIAGNOSIS — Z90410 Acquired total absence of pancreas: Secondary | ICD-10-CM | POA: Diagnosis not present

## 2023-02-13 DIAGNOSIS — Z9081 Acquired absence of spleen: Secondary | ICD-10-CM | POA: Diagnosis not present

## 2023-02-13 DIAGNOSIS — Z86718 Personal history of other venous thrombosis and embolism: Secondary | ICD-10-CM | POA: Diagnosis not present

## 2023-02-13 DIAGNOSIS — Z8507 Personal history of malignant neoplasm of pancreas: Secondary | ICD-10-CM | POA: Diagnosis not present

## 2023-02-13 DIAGNOSIS — Z6822 Body mass index (BMI) 22.0-22.9, adult: Secondary | ICD-10-CM | POA: Diagnosis not present

## 2023-02-13 DIAGNOSIS — I2510 Atherosclerotic heart disease of native coronary artery without angina pectoris: Secondary | ICD-10-CM | POA: Diagnosis not present

## 2023-02-13 DIAGNOSIS — Z951 Presence of aortocoronary bypass graft: Secondary | ICD-10-CM | POA: Diagnosis not present

## 2023-02-13 DIAGNOSIS — E7800 Pure hypercholesterolemia, unspecified: Secondary | ICD-10-CM | POA: Diagnosis not present

## 2023-02-13 DIAGNOSIS — Z7902 Long term (current) use of antithrombotics/antiplatelets: Secondary | ICD-10-CM | POA: Diagnosis not present

## 2023-02-13 DIAGNOSIS — I10 Essential (primary) hypertension: Secondary | ICD-10-CM | POA: Diagnosis not present

## 2023-02-13 DIAGNOSIS — K219 Gastro-esophageal reflux disease without esophagitis: Secondary | ICD-10-CM | POA: Diagnosis not present

## 2023-02-13 DIAGNOSIS — Z794 Long term (current) use of insulin: Secondary | ICD-10-CM | POA: Diagnosis not present

## 2023-02-13 DIAGNOSIS — E1151 Type 2 diabetes mellitus with diabetic peripheral angiopathy without gangrene: Secondary | ICD-10-CM | POA: Diagnosis not present

## 2023-02-13 DIAGNOSIS — N401 Enlarged prostate with lower urinary tract symptoms: Secondary | ICD-10-CM | POA: Diagnosis not present

## 2023-02-13 DIAGNOSIS — I639 Cerebral infarction, unspecified: Secondary | ICD-10-CM | POA: Diagnosis not present

## 2023-02-13 DIAGNOSIS — H9190 Unspecified hearing loss, unspecified ear: Secondary | ICD-10-CM | POA: Diagnosis not present

## 2023-02-13 DIAGNOSIS — I5020 Unspecified systolic (congestive) heart failure: Secondary | ICD-10-CM | POA: Diagnosis not present

## 2023-02-13 DIAGNOSIS — F419 Anxiety disorder, unspecified: Secondary | ICD-10-CM | POA: Diagnosis not present

## 2023-02-13 DIAGNOSIS — Z7984 Long term (current) use of oral hypoglycemic drugs: Secondary | ICD-10-CM | POA: Diagnosis not present

## 2023-02-13 DIAGNOSIS — Z87442 Personal history of urinary calculi: Secondary | ICD-10-CM | POA: Diagnosis not present

## 2023-02-13 DIAGNOSIS — I110 Hypertensive heart disease with heart failure: Secondary | ICD-10-CM | POA: Diagnosis not present

## 2023-02-13 DIAGNOSIS — G4700 Insomnia, unspecified: Secondary | ICD-10-CM | POA: Diagnosis not present

## 2023-02-15 DIAGNOSIS — Z794 Long term (current) use of insulin: Secondary | ICD-10-CM | POA: Diagnosis not present

## 2023-02-15 DIAGNOSIS — I69354 Hemiplegia and hemiparesis following cerebral infarction affecting left non-dominant side: Secondary | ICD-10-CM | POA: Diagnosis not present

## 2023-02-15 DIAGNOSIS — E7800 Pure hypercholesterolemia, unspecified: Secondary | ICD-10-CM | POA: Diagnosis not present

## 2023-02-15 DIAGNOSIS — Z86718 Personal history of other venous thrombosis and embolism: Secondary | ICD-10-CM | POA: Diagnosis not present

## 2023-02-15 DIAGNOSIS — I2510 Atherosclerotic heart disease of native coronary artery without angina pectoris: Secondary | ICD-10-CM | POA: Diagnosis not present

## 2023-02-15 DIAGNOSIS — Z7982 Long term (current) use of aspirin: Secondary | ICD-10-CM | POA: Diagnosis not present

## 2023-02-15 DIAGNOSIS — Z8507 Personal history of malignant neoplasm of pancreas: Secondary | ICD-10-CM | POA: Diagnosis not present

## 2023-02-15 DIAGNOSIS — Z9081 Acquired absence of spleen: Secondary | ICD-10-CM | POA: Diagnosis not present

## 2023-02-15 DIAGNOSIS — F1120 Opioid dependence, uncomplicated: Secondary | ICD-10-CM | POA: Diagnosis not present

## 2023-02-15 DIAGNOSIS — Z8701 Personal history of pneumonia (recurrent): Secondary | ICD-10-CM | POA: Diagnosis not present

## 2023-02-15 DIAGNOSIS — K219 Gastro-esophageal reflux disease without esophagitis: Secondary | ICD-10-CM | POA: Diagnosis not present

## 2023-02-15 DIAGNOSIS — Z87442 Personal history of urinary calculi: Secondary | ICD-10-CM | POA: Diagnosis not present

## 2023-02-15 DIAGNOSIS — Z7902 Long term (current) use of antithrombotics/antiplatelets: Secondary | ICD-10-CM | POA: Diagnosis not present

## 2023-02-15 DIAGNOSIS — Z951 Presence of aortocoronary bypass graft: Secondary | ICD-10-CM | POA: Diagnosis not present

## 2023-02-15 DIAGNOSIS — F419 Anxiety disorder, unspecified: Secondary | ICD-10-CM | POA: Diagnosis not present

## 2023-02-15 DIAGNOSIS — I5020 Unspecified systolic (congestive) heart failure: Secondary | ICD-10-CM | POA: Diagnosis not present

## 2023-02-15 DIAGNOSIS — N39498 Other specified urinary incontinence: Secondary | ICD-10-CM | POA: Diagnosis not present

## 2023-02-15 DIAGNOSIS — E1151 Type 2 diabetes mellitus with diabetic peripheral angiopathy without gangrene: Secondary | ICD-10-CM | POA: Diagnosis not present

## 2023-02-15 DIAGNOSIS — I110 Hypertensive heart disease with heart failure: Secondary | ICD-10-CM | POA: Diagnosis not present

## 2023-02-15 DIAGNOSIS — Z7984 Long term (current) use of oral hypoglycemic drugs: Secondary | ICD-10-CM | POA: Diagnosis not present

## 2023-02-15 DIAGNOSIS — G4700 Insomnia, unspecified: Secondary | ICD-10-CM | POA: Diagnosis not present

## 2023-02-15 DIAGNOSIS — N401 Enlarged prostate with lower urinary tract symptoms: Secondary | ICD-10-CM | POA: Diagnosis not present

## 2023-02-15 DIAGNOSIS — Z90410 Acquired total absence of pancreas: Secondary | ICD-10-CM | POA: Diagnosis not present

## 2023-02-15 DIAGNOSIS — H9190 Unspecified hearing loss, unspecified ear: Secondary | ICD-10-CM | POA: Diagnosis not present

## 2023-02-21 DIAGNOSIS — Z8701 Personal history of pneumonia (recurrent): Secondary | ICD-10-CM | POA: Diagnosis not present

## 2023-02-21 DIAGNOSIS — Z7902 Long term (current) use of antithrombotics/antiplatelets: Secondary | ICD-10-CM | POA: Diagnosis not present

## 2023-02-21 DIAGNOSIS — Z86718 Personal history of other venous thrombosis and embolism: Secondary | ICD-10-CM | POA: Diagnosis not present

## 2023-02-21 DIAGNOSIS — F1120 Opioid dependence, uncomplicated: Secondary | ICD-10-CM | POA: Diagnosis not present

## 2023-02-21 DIAGNOSIS — I2510 Atherosclerotic heart disease of native coronary artery without angina pectoris: Secondary | ICD-10-CM | POA: Diagnosis not present

## 2023-02-21 DIAGNOSIS — Z9081 Acquired absence of spleen: Secondary | ICD-10-CM | POA: Diagnosis not present

## 2023-02-21 DIAGNOSIS — I110 Hypertensive heart disease with heart failure: Secondary | ICD-10-CM | POA: Diagnosis not present

## 2023-02-21 DIAGNOSIS — N39498 Other specified urinary incontinence: Secondary | ICD-10-CM | POA: Diagnosis not present

## 2023-02-21 DIAGNOSIS — N401 Enlarged prostate with lower urinary tract symptoms: Secondary | ICD-10-CM | POA: Diagnosis not present

## 2023-02-21 DIAGNOSIS — Z794 Long term (current) use of insulin: Secondary | ICD-10-CM | POA: Diagnosis not present

## 2023-02-21 DIAGNOSIS — Z7982 Long term (current) use of aspirin: Secondary | ICD-10-CM | POA: Diagnosis not present

## 2023-02-21 DIAGNOSIS — E1151 Type 2 diabetes mellitus with diabetic peripheral angiopathy without gangrene: Secondary | ICD-10-CM | POA: Diagnosis not present

## 2023-02-21 DIAGNOSIS — K219 Gastro-esophageal reflux disease without esophagitis: Secondary | ICD-10-CM | POA: Diagnosis not present

## 2023-02-21 DIAGNOSIS — Z7984 Long term (current) use of oral hypoglycemic drugs: Secondary | ICD-10-CM | POA: Diagnosis not present

## 2023-02-21 DIAGNOSIS — E7800 Pure hypercholesterolemia, unspecified: Secondary | ICD-10-CM | POA: Diagnosis not present

## 2023-02-21 DIAGNOSIS — Z8507 Personal history of malignant neoplasm of pancreas: Secondary | ICD-10-CM | POA: Diagnosis not present

## 2023-02-21 DIAGNOSIS — G4700 Insomnia, unspecified: Secondary | ICD-10-CM | POA: Diagnosis not present

## 2023-02-21 DIAGNOSIS — F419 Anxiety disorder, unspecified: Secondary | ICD-10-CM | POA: Diagnosis not present

## 2023-02-21 DIAGNOSIS — I5020 Unspecified systolic (congestive) heart failure: Secondary | ICD-10-CM | POA: Diagnosis not present

## 2023-02-21 DIAGNOSIS — H9190 Unspecified hearing loss, unspecified ear: Secondary | ICD-10-CM | POA: Diagnosis not present

## 2023-02-21 DIAGNOSIS — I69354 Hemiplegia and hemiparesis following cerebral infarction affecting left non-dominant side: Secondary | ICD-10-CM | POA: Diagnosis not present

## 2023-02-21 DIAGNOSIS — Z951 Presence of aortocoronary bypass graft: Secondary | ICD-10-CM | POA: Diagnosis not present

## 2023-02-21 DIAGNOSIS — Z90410 Acquired total absence of pancreas: Secondary | ICD-10-CM | POA: Diagnosis not present

## 2023-02-21 DIAGNOSIS — Z87442 Personal history of urinary calculi: Secondary | ICD-10-CM | POA: Diagnosis not present

## 2023-02-27 DIAGNOSIS — I739 Peripheral vascular disease, unspecified: Secondary | ICD-10-CM | POA: Diagnosis not present

## 2023-02-27 DIAGNOSIS — M792 Neuralgia and neuritis, unspecified: Secondary | ICD-10-CM | POA: Diagnosis not present

## 2023-02-27 DIAGNOSIS — B351 Tinea unguium: Secondary | ICD-10-CM | POA: Diagnosis not present

## 2023-03-02 DIAGNOSIS — Z87442 Personal history of urinary calculi: Secondary | ICD-10-CM | POA: Diagnosis not present

## 2023-03-02 DIAGNOSIS — Z7902 Long term (current) use of antithrombotics/antiplatelets: Secondary | ICD-10-CM | POA: Diagnosis not present

## 2023-03-02 DIAGNOSIS — I5020 Unspecified systolic (congestive) heart failure: Secondary | ICD-10-CM | POA: Diagnosis not present

## 2023-03-02 DIAGNOSIS — G4700 Insomnia, unspecified: Secondary | ICD-10-CM | POA: Diagnosis not present

## 2023-03-02 DIAGNOSIS — E1151 Type 2 diabetes mellitus with diabetic peripheral angiopathy without gangrene: Secondary | ICD-10-CM | POA: Diagnosis not present

## 2023-03-02 DIAGNOSIS — K219 Gastro-esophageal reflux disease without esophagitis: Secondary | ICD-10-CM | POA: Diagnosis not present

## 2023-03-02 DIAGNOSIS — I110 Hypertensive heart disease with heart failure: Secondary | ICD-10-CM | POA: Diagnosis not present

## 2023-03-02 DIAGNOSIS — Z90410 Acquired total absence of pancreas: Secondary | ICD-10-CM | POA: Diagnosis not present

## 2023-03-02 DIAGNOSIS — Z7982 Long term (current) use of aspirin: Secondary | ICD-10-CM | POA: Diagnosis not present

## 2023-03-02 DIAGNOSIS — I2510 Atherosclerotic heart disease of native coronary artery without angina pectoris: Secondary | ICD-10-CM | POA: Diagnosis not present

## 2023-03-02 DIAGNOSIS — E7800 Pure hypercholesterolemia, unspecified: Secondary | ICD-10-CM | POA: Diagnosis not present

## 2023-03-02 DIAGNOSIS — F419 Anxiety disorder, unspecified: Secondary | ICD-10-CM | POA: Diagnosis not present

## 2023-03-02 DIAGNOSIS — Z951 Presence of aortocoronary bypass graft: Secondary | ICD-10-CM | POA: Diagnosis not present

## 2023-03-02 DIAGNOSIS — Z7984 Long term (current) use of oral hypoglycemic drugs: Secondary | ICD-10-CM | POA: Diagnosis not present

## 2023-03-02 DIAGNOSIS — Z8507 Personal history of malignant neoplasm of pancreas: Secondary | ICD-10-CM | POA: Diagnosis not present

## 2023-03-02 DIAGNOSIS — Z8701 Personal history of pneumonia (recurrent): Secondary | ICD-10-CM | POA: Diagnosis not present

## 2023-03-02 DIAGNOSIS — Z86718 Personal history of other venous thrombosis and embolism: Secondary | ICD-10-CM | POA: Diagnosis not present

## 2023-03-02 DIAGNOSIS — F1120 Opioid dependence, uncomplicated: Secondary | ICD-10-CM | POA: Diagnosis not present

## 2023-03-02 DIAGNOSIS — N401 Enlarged prostate with lower urinary tract symptoms: Secondary | ICD-10-CM | POA: Diagnosis not present

## 2023-03-02 DIAGNOSIS — I69354 Hemiplegia and hemiparesis following cerebral infarction affecting left non-dominant side: Secondary | ICD-10-CM | POA: Diagnosis not present

## 2023-03-02 DIAGNOSIS — N39498 Other specified urinary incontinence: Secondary | ICD-10-CM | POA: Diagnosis not present

## 2023-03-02 DIAGNOSIS — H9190 Unspecified hearing loss, unspecified ear: Secondary | ICD-10-CM | POA: Diagnosis not present

## 2023-03-02 DIAGNOSIS — Z794 Long term (current) use of insulin: Secondary | ICD-10-CM | POA: Diagnosis not present

## 2023-03-02 DIAGNOSIS — Z9081 Acquired absence of spleen: Secondary | ICD-10-CM | POA: Diagnosis not present

## 2023-03-05 NOTE — Progress Notes (Signed)
Cardiology Office Note    Date:  03/16/2023   ID:  Ricardo Le, DOB [birth year], MRN [id number]  PCP:  Noni Saupe, MD   [facility]  Cardiologist:  Charlton Haws, MD    History of Present Illness:  Ricardo Le is a 83 y.o. male  with CAD s/p CABG X 4  in 2009, GERD, PAD left iliac stenting, known bilat SFA occlusion, HLD, T2DM.  Patient had STEMI 10/2020 PCI/DES with aspiration thrombectomy to SVG-OM1/OM2, ICM echo EF 35-40% Grade 2 DD. Saw Dr. Kirke Corin for worsening claudication 03/04/21 but ABI's stable and patent left iliac stent. 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 Given age this is not anything that would need surgical intervention and just DAT which he is on for his CAD  Updated carotids 03/07/21 with nonobstructive disease Despite dyspnea BNP normal 04/29/21 with Hct drop to 37.4 from 44   Having some prostate issues with hypertrophy and UTI"s Seeing Dr Sande Brothers with [facility] On Flomax and Proscar   Having back problems and had stimulator placed 06/20/22  Hospitalized 01/06/23 with acute right lateral thalamic infarct presenting with left hand and arm weakness Had numbness in left face as well He has some dementia progressing as well with frequent falls Studies with mostly vertebral and intracranial dx no ICA stenosis. Rx with DAT  Echo done 01/08/23 during admission showed EF 40-45% AV sclerosis , mild MR and negative bubble study.   Basically failing health with dementia, stroke, DM, and some prostatism Wife does a good job Taking care of him   Past Medical History:  Diagnosis Date   CAD (coronary artery disease)    a. 04/2007 s/p CABG x 4  - LIMA->LAD, VG->OM1->Om2, VG->PDA;  b. 12/2008 & 06/2010 Caths - Native multivessel dzs with 4/4 patent grafts. c. cath 03/09/2015 patent grafts, unchanged from prior cath.   Cancer [facility])    pancreatic  March 2011    Carotid bruit    a. 07/2010 U/S- 0-39% bilat ICA stenosis   Chest pain    Noncardiac probably related to reflux   DDD (degenerative disc disease)    several surgeries   Fracture acetabulum-closed (HCC) 07/08/2018   GERD (gastroesophageal reflux disease)    HNP (herniated nucleus pulposus), lumbar    a. L2-3, s/p laminotomy, microdiskectomy 02/2009   HOH (hard of hearing)    uses amplifiers    Nephrolithiasis    PAD (peripheral artery disease) (HCC) 04/2014   s/p L Common Iliac Stent   Palpitations    Pancreatic mass    a. 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   Pneumonia    hx of  pna   Pure hypercholesterolemia    statin intolerance   Type II or unspecified type diabetes mellitus without mention of complication, not stated as uncontrolled    insulin dependent   Unspecified essential hypertension     Past Surgical History:  Procedure Laterality Date   ABDOMINAL AORTAGRAM N/A 05/13/2014   Procedure: ABDOMINAL Ronny Flurry;  Surgeon: Iran Ouch, MD;  Location: [facility] CATH LAB;  Service: Cardiovascular;  Laterality: N/A;   ANTERIOR CERVICAL DECOMP/DISCECTOMY FUSION N/A 07/28/2014   Procedure: ANTERIOR CERVICAL DECOMPRESSION/DISCECTOMY FUSION CERVICAL FOUR-FIVE CERVICAL FIVE-SIX ;  Surgeon: Julio Sicks, MD;  Location: [facility] NEURO ORS;  Service: Neurosurgery;  Laterality: N/A;   APPENDECTOMY     arthroscopy right knee  BACK SURGERY     CARDIAC CATHETERIZATION N/A 03/09/2015   Procedure: Left Heart Cath and Cors/Grafts Angiography;  Surgeon: Lennette Bihari, MD;  Location: [facility] INVASIVE CV LAB;  Service: Cardiovascular;  Laterality: N/A;   CHOLECYSTECTOMY     CORONARY ARTERY BYPASS GRAFT     X 4 2009 (LIMA to LAD, SVG to first cicumflex marginal branch with sequential SVG to second cicumflex marginal branch  and saphenous vein to posterior descending coronary artery with endoscopic,vein harvest rt. lower exttremity by Dr.Owen on March 31,2009   CORONARY/GRAFT ACUTE MI  REVASCULARIZATION N/A 11/06/2020   Procedure: Coronary/Graft Acute MI Revascularization;  Surgeon: Swazil[place], Trinia Georgi M, MD;  Location: [facility] INVASIVE CV LAB;  Service: Cardiovascular;  Laterality: N/A;   CYST REMOVED     FROM SPINE   CYSTOURETHROSCOPY     right retrograde pyelogram,manipulate stone in the renal pelvis, rt. double-j catheter.   EYE SURGERY     KNEE SURGERY     rt knee   LEFT HEART CATH AND CORONARY ANGIOGRAPHY N/A 11/06/2020   Procedure: LEFT HEART CATH AND CORONARY ANGIOGRAPHY;  Surgeon: Swazil[place], Maloni Musleh M, MD;  Location: [facility] INVASIVE CV LAB;  Service: Cardiovascular;  Laterality: N/A;   LEFT HEART CATH AND CORS/GRAFTS ANGIOGRAPHY N/A 02/20/2017   Procedure: LEFT HEART CATH AND CORS/GRAFTS ANGIOGRAPHY;  Surgeon: Marykay Lex, MD;  Location: [facility] INVASIVE CV LAB;  Service: Cardiovascular;  Laterality: N/A;   LEFT HEART CATH AND CORS/GRAFTS ANGIOGRAPHY N/A 05/28/2018   Procedure: LEFT HEART CATH AND CORS/GRAFTS ANGIOGRAPHY;  Surgeon: Kathleene Hazel, MD;  Location: [facility] INVASIVE CV LAB;  Service: Cardiovascular;  Laterality: N/A;   NEPHROLITHOTOMY     PERCUTANEOUS   PARS PLANA VITRECTOMY     lt. eye,retinal photocoagulation lt. eye, membrane peel lt. eye.   PERCUTANEOUS STENT INTERVENTION Left 05/13/2014   Procedure: PERCUTANEOUS STENT INTERVENTION;  Surgeon: Iran Ouch, MD;  Location: [facility] CATH LAB;  Service: Cardiovascular;  Laterality: Left;  COMMON ILLIAC   re-exploratin of laminectomy  05/2008   (RT L2-3) WITH REDO MICRODISKECTOMY    Current Medications: Current Meds  Medication Sig   acetaminophen (TYLENOL) 325 MG tablet Take 650 mg by mouth 2 (two) times daily as needed (pain).   albuterol (VENTOLIN HFA) 108 (90 Base) MCG/ACT inhaler Inhale 2 puffs into the lungs every 6 (six) hours as needed for wheezing or shortness of breath.   aspirin EC 81 MG tablet Take 81 mg by mouth daily.   atorvastatin (LIPITOR) 80 MG tablet Take 1 tablet (80 mg total) by mouth daily.    clopidogrel (PLAVIX) 75 MG tablet Take 1 tablet (75 mg total) by mouth daily.   empagliflozin (JARDIANCE) 10 MG TABS tablet Take 10 mg by mouth daily.   insulin glargine (LANTUS) 100 UNIT/ML Solostar Pen Inject 15 Units into the skin at bedtime.   insulin lispro (ADMELOG) 100 UNIT/ML injection Inject 3 Units into the skin See admin instructions. Inject 3 units twice daily before lunch and supper.   Magnesium Hydroxide (MILK OF MAGNESIA PO) Take 30 mLs by mouth See admin instructions. Give 30 mL every morning. Repeat dose if no BM prior to lunch. Repeat dose once more if no BM before supper.   Melatonin 10 MG TABS Take 10 mg by mouth at bedtime.   metoprolol succinate (TOPROL-XL) 25 MG 24 hr tablet Take 0.5 tablets (12.5 mg total) by mouth daily.   nitroGLYCERIN (NITROSTAT) 0.4 MG SL tablet DISSOLVE 1 TABLET UNDER THE TONGUE EVERY 5  MINUTES AS NEEDED FOR CHEST PAIN   pantoprazole (PROTONIX) 40 MG tablet Take 40 mg by mouth 2 (two) times daily.   tamsulosin (FLOMAX) 0.4 MG CAPS capsule Take 1 capsule (0.4 mg total) by mouth daily after supper. (Patient taking differently: Take 0.4 mg by mouth 2 (two) times daily.)   traMADol (ULTRAM) 50 MG tablet Take 50 mg by mouth 2 (two) times daily as needed for moderate pain.   Wheat Dextrin (BENEFIBER PO) Take 1 Scoop by mouth See admin instructions. Give 1 Tablespoon mixed into drink every morning after breakfast.     Allergies:   Bentyl [dicyclomine], Crestor [rosuvastatin], Ranexa [ranolazine], Roxicodone [oxycodone], Ultram [tramadol], Iodinated contrast media, Iohexol, Ioversol, and Statins   Social History   Socioeconomic History   Marital status: Married    Spouse name: Not on file   Number of children: Not on file   Years of education: Not on file   Highest education level: Not on file  Occupational History   Not on file  Tobacco Use   Smoking status: Never   Smokeless tobacco: Never  Vaping Use   Vaping status: Never Used  Substance and  Sexual Activity   Alcohol use: Not on file   Drug use: No   Sexual activity: Not Currently  Other Topics Concern   Not on file  Social History Narrative   Lives in [place] with wife.  Retired Optician, dispensing.  Walks 1.23mi daily.  Does not work 2/2 back problems.   Social Drivers of Corporate investment banker Strain: Not on file  Food Insecurity: No Food Insecurity (01/06/2023)   Hunger Vital Sign    Worried About Running Out of Food in the Last Year: Never true    Ran Out of Food in the Last Year: Never true  Transportation Needs: No Transportation Needs (01/06/2023)   PRAPARE - Administrator, Civil Service (Medical): No    Lack of Transportation (Non-Medical): No  Physical Activity: Not on file  Stress: Not on file  Social Connections: Not on file     Family History:  The patient's  family history includes Coronary artery disease in his brother and sister; Diabetes in his brother; Heart attack in his father; Other in his mother and another family member.   ROS:   Please see the history of present illness.    ROS All other systems reviewed and are negative.   PHYSICAL EXAM:   VS:  BP 102/60   Pulse 60   Ht 5\' 5"  (1.651 m)   Wt 147 lb (66.7 kg)   SpO2 95%   BMI 24.46 kg/m   Physical Exam  GEN: Well nourished, well developed, in no acute distress  Neck: no JVD, carotid bruits,bilateral  or masses Cardiac:RRR; SEM murmurs, rubs, or gallops  Respiratory:  decreased breath sounds right lung base but otherwise clear to auscultation bilaterally, normal work of breathing GI: soft, nontender, nondistended, + BS Ext: without cyanosis, clubbing, or edema,  Neuro:  Alert and Oriented x 3, Strength and sensation are intact Psych: euthymic mood, full affect  Wt Readings from Last 3 Encounters:  03/16/23 147 lb (66.7 kg)  01/11/23 154 lb 5.2 oz (70 kg)  01/07/23 154 lb 5.2 oz (70 kg)      Studies/Labs Reviewed:   EKG:  03/16/2023 SR rate 57 PR 218 msec LAD no acute  changes   Recent Labs: 01/06/2023: ALT 27 01/07/2023: TSH 0.706 01/11/2023: BUN 20; Creatinine, Ser 1.09; Hemoglobin 14.8;  Platelets 242; Potassium 4.1; Sodium 136   Lipid Panel    Component Value Date/Time   CHOL 146 01/06/2023 0840   CHOL 210 (H) 09/08/2019 1506   TRIG 92 01/06/2023 0840   HDL 46 01/06/2023 0840   HDL 48 09/08/2019 1506   CHOLHDL 3.2 01/06/2023 0840   VLDL 18 01/06/2023 0840   LDLCALC 82 01/06/2023 0840   LDLCALC 145 (H) 09/08/2019 1506    Additional studies/ records that were reviewed today include:   Cardiac MRI:    IMPRESSION: 1. 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 Tissue characterization unable to be definitively performed as the mass is too small to characterize.   2. Normal left ventricular chamber size. Evidence of prior infarct with subendocardial delayed enhancement, first pass perfusion defect, and wall motion abnormality in the LAD distribution. Nontransmural enhancement suggests viable myocardium. LVEF 55%.   3.  Normal right ventricular chamber size and function, RVEF 50%.   4. Mild-moderate mitral valve regurgitation by calculated regurgitant fraction 27%.    Echo 01/08/23  IMPRESSIONS     1. Left ventricular ejection fraction, by estimation, is 40 to 45%. The  left ventricle has mildly decreased function. The left ventricle  demonstrates regional wall motion abnormalities (see scoring  diagram/findings for description). Left ventricular  diastolic parameters are consistent with Grade I diastolic dysfunction  (impaired relaxation).   2. Right ventricular systolic function is mildly reduced. The right  ventricular size is normal.   3. Left atrial size was mildly dilated.   4. The mitral valve is normal in structure. Mild mitral valve  regurgitation. No evidence of mitral stenosis.   5. The aortic valve is tricuspid. There is moderate  calcification of the  aortic valve. Aortic valve regurgitation is not visualized. Aortic valve  sclerosis/calcification is present, without any evidence of aortic  stenosis.   6. The inferior vena cava is normal in size with greater than 50%  respiratory variability, suggesting right atrial pressure of 3 mmHg.   7. Agitated saline contrast bubble study was negative, with no evidence  of any interatrial shunt.    Final  Cath 11/06/20   Prox LAD to Mid LAD lesion is 85% stenosed.   Prox LAD lesion is 100% stenosed.   Dist Cx lesion is 100% stenosed.   Prox RCA to Dist RCA lesion is 100% stenosed.   Ost 1st Diag lesion is 70% stenosed.   Ost 2nd Diag to 2nd Diag lesion is 70% stenosed.   Ost 3rd Mrg lesion is 100% stenosed.   Mid Graft to Dist Graft lesion before 1st Mrg  is 100% stenosed.   Ost LAD to Prox LAD lesion is 100% stenosed.   Ost Cx to Dist Cx lesion is 100% stenosed.   A drug-eluting stent was successfully placed using a STENT ONYX FRONTIER 3.5X30.   Post intervention, there is a 0% residual stenosis.   SVG and is large.   There is mild left ventricular systolic dysfunction.   LV end diastolic pressure is severely elevated.   The left ventricular ejection fraction is 45-50% by visual estimate.   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 Patent LIMA to the LAD Patent SVG to the PDA Occluded SVG sequentially to OM1 and OM2 Mild LV dysfunction. EF 45%.  Elevated LVEDP 30 mm Hg Successful PCI of the SVG to OM1 and OM2 with aspiration thrombectomy and DES x 1  Plan: DAPT for one year. Continue pressor support- wean as tolerated. Angiomax discontinued- plan to remove sheath in 2 hours.      PLAN:  In order of problems listed above:   CAD s/p CABG X 4  in 2009, STEMI 10/2020 PCI/DES with aspiration thrombectomy to SVG-OM1/OM2. No angina on plavix and ASA/statin  ICM EF 40-45% by TTE 01/08/23 continue GDMT  During stroke admission started on Avapro with his  Toprol and Jardiance    HLD LDL 82 on atorvastatin and zetia   PAD left iliac stent and bilateral SFA occlusion, 04/07/21 ABI's stable and patent stent.F/U [facility]   DM2 per PCP A1c 8.1 needs better diet/control   Prostate:  BPH on Flomax and Proscar  F/U Dr Truman Hayward to hold plavix for procedures    Stroke:  intracranial dx with LUE weakness. On DAT per neurology    F/U [facility] next available   F/U me in a year    Signed, Charlton Haws, MD  03/16/2023 11:18 AM    [facility] [address] [place], [place], [place]  [address] Phone: [phone]; Fax: [phone]

## 2023-03-08 ENCOUNTER — Telehealth: Payer: Self-pay | Admitting: Neurology

## 2023-03-08 NOTE — Telephone Encounter (Signed)
Appointment details confirmed

## 2023-03-12 DIAGNOSIS — Z7901 Long term (current) use of anticoagulants: Secondary | ICD-10-CM | POA: Diagnosis not present

## 2023-03-12 DIAGNOSIS — E1165 Type 2 diabetes mellitus with hyperglycemia: Secondary | ICD-10-CM | POA: Diagnosis not present

## 2023-03-16 ENCOUNTER — Encounter: Payer: Self-pay | Admitting: Cardiovascular Disease

## 2023-03-16 ENCOUNTER — Ambulatory Visit: Payer: Medicare HMO | Attending: Cardiovascular Disease | Admitting: Cardiovascular Disease

## 2023-03-16 VITALS — BP 102/60 | HR 60 | Ht 65.0 in | Wt 147.0 lb

## 2023-03-16 DIAGNOSIS — Z951 Presence of aortocoronary bypass graft: Secondary | ICD-10-CM | POA: Diagnosis not present

## 2023-03-16 DIAGNOSIS — I5022 Chronic systolic (congestive) heart failure: Secondary | ICD-10-CM | POA: Diagnosis not present

## 2023-03-16 DIAGNOSIS — I10 Essential (primary) hypertension: Secondary | ICD-10-CM

## 2023-03-16 DIAGNOSIS — I739 Peripheral vascular disease, unspecified: Secondary | ICD-10-CM

## 2023-03-16 NOTE — Patient Instructions (Addendum)
Medication Instructions:  Your physician recommends that you continue on your current medications as directed. Please refer to the Current Medication list given to you today.  *If you need a refill on your cardiac medications before your next appointment, please call your pharmacy*  Lab Work: If you have labs (blood work) drawn today and your tests are completely normal, you will receive your results only by: MyChart Message (if you have MyChart) OR A paper copy in the mail If you have any lab test that is abnormal or we need to change your treatment, we will call you to review the results.  Testing/Procedures: None ordered today.   Follow-Up: At [facility], you and your health needs are our priority.  As part of our continuing mission to provide you with exceptional heart care, we have created designated Provider Care Teams.  These Care Teams include your primary Cardiologist (physician) and Advanced Practice Providers (APPs -  Physician Assistants and Nurse Practitioners) who all work together to provide you with the care you need, when you need it.  We recommend signing up for the patient portal called "MyChart".  Sign up information is provided on this After Visit Summary.  MyChart is used to connect with patients for Virtual Visits (Telemedicine).  Patients are able to view lab/test results, encounter notes, upcoming appointments, etc.  Non-urgent messages can be sent to your provider as well.   To learn more about what you can do with MyChart, go to [email or web address].    Your next appointment:   6 months  Provider:   Charlton Haws, MD     Other Instructions

## 2023-04-10 ENCOUNTER — Encounter: Payer: Self-pay | Admitting: Neurology

## 2023-04-10 ENCOUNTER — Ambulatory Visit: Payer: Medicare HMO | Admitting: Neurology

## 2023-04-10 VITALS — BP 133/59 | HR 62 | Ht 65.0 in | Wt 150.0 lb

## 2023-04-10 DIAGNOSIS — G301 Alzheimer's disease with late onset: Secondary | ICD-10-CM | POA: Diagnosis not present

## 2023-04-10 DIAGNOSIS — R269 Unspecified abnormalities of gait and mobility: Secondary | ICD-10-CM

## 2023-04-10 DIAGNOSIS — F01A0 Vascular dementia, mild, without behavioral disturbance, psychotic disturbance, mood disturbance, and anxiety: Secondary | ICD-10-CM

## 2023-04-10 DIAGNOSIS — F02A0 Dementia in other diseases classified elsewhere, mild, without behavioral disturbance, psychotic disturbance, mood disturbance, and anxiety: Secondary | ICD-10-CM | POA: Diagnosis not present

## 2023-04-10 DIAGNOSIS — W19XXXS Unspecified fall, sequela: Secondary | ICD-10-CM

## 2023-04-10 DIAGNOSIS — H9193 Unspecified hearing loss, bilateral: Secondary | ICD-10-CM | POA: Diagnosis not present

## 2023-04-10 MED ORDER — RIVASTIGMINE TARTRATE 1.5 MG PO CAPS: 1.5000 mg | ORAL_CAPSULE | Freq: Two times a day (BID) | ORAL | 11 refills | Status: AC

## 2023-04-10 NOTE — Patient Instructions (Addendum)
 Will obtain ATN profile to confirm the presence of Alzheimer disease biomarker Start Exelon 1.5 mg twice daily, side effects of the medication include dizziness, vivid dreams, diarrhea Referral for to home services for home health aide and physical therapy Continue your other medications Continue to follow-up with Doctors Return in 1 year or sooner if worse.

## 2023-04-10 NOTE — Progress Notes (Addendum)
 [facility]  PATIENT: Ricardo Le DOB: 1941-01-27  REQUESTING CLINICIAN: Noni Saupe, MD HISTORY FROM: Patient and family  REASON FOR VISIT: Memory loss    HISTORICAL  CHIEF COMPLAINT:  Chief Complaint  Patient presents with   Room 12    Pt is here with his Wife and Son. Pt's wife states that pt had a Stroke close to Thanksgiving. Pt's wife states that he will sometimes call her momma, and that there are 2 Betty's. Pt's wife states that pt will ask her the same question over and over again. Pt's wife states that pt has a hard time with getting dressed, and has to have direction. Pt's wife states that pt has fallen,but not to the point of hurting himself.     HISTORY OF PRESENT ILLNESS:  This 83 year old gentleman past medical history hypertension, hyperlipidemia, heart disease, lung disease, diabetes who is presenting with memory complaint with his wife and son.  He tells me that he feels that his memory is fine for 83 year old gentleman.  Sometime he is forgetful but does not think much of it.  He is also very hard of hearing and has been misplacing his hearing aid.  Wife gave me a detailed history that he did have a fall and a brain bleed several years ago, at that time most likely had a concussion.   In the past recent years, he started getting more forgetful, forgetful about names, years, date and birthday.  At times, his mind is clear but again some time he is very forgetful. Currently wife has noticed a lot of confusion, forgetting where the bathroom is in the own house, sometimes he wanders and start looking for people that are not there.  He thinks that his adult children are still small and still lives with them.  Sometimes confused her daughter with her wife, he does ask about his parents who are deceased.  Today when I askedwho are the people that came to the visit with him, he did reply his mom and his father. Wife also tells me that patient  asked a lot about sexual relation, they have not been sexually active in 20 years or more.  He is not aggressive.   TBI: Yes  No past history of TBI Stroke: Yes no past history of stroke Seizures: no past history of seizures Sleep:   no history of sleep apnea.  Mood:  patient denies anxiety and depression Family history of Dementia: Sister with dementia   Functional status: Dependent in some ADLs and IADLs Patient lives with wife. Cooking: no Cleaning: no Shopping: no Bathing: need helps Toileting: patient  Driving: no for the past the 6 months  Bills: Wife  Medications: wife helps Ever left the stove on by accident?: n/a Forget how to use items around the house?: yes Getting lost going to familiar places?: yes Forgetting loved ones names?: yes, sometimes think wife is his mother Word finding difficulty? no Sleep: good    OTHER MEDICAL CONDITIONS: Hypertension, hyperlipidemia, diabetes, heart and lung disease    REVIEW OF SYSTEMS: Full 14 system review of systems performed and negative with exception of: As noted in the HPI   ALLERGIES: Allergies  Allergen Reactions   Bentyl [Dicyclomine] Anaphylaxis   Crestor [Rosuvastatin] Other (See Comments)    Myalgias    Ranexa [Ranolazine] Other (See Comments)    Weakness    Roxicodone [Oxycodone] Nausea And Vomiting   Iodinated Contrast Media Hives, Itching and Rash  Iohexol Hives and Itching    Patient is ok with premedication (Benadryl 50 mg by mouth); Onset Date: 12/03/2007    Ioversol Hives, Itching and Rash   Statins Other (See Comments)    Myalgias Able to tolerate atorvastatin    HOME MEDICATIONS: Outpatient Medications Prior to Visit  Medication Sig Dispense Refill   acetaminophen (TYLENOL) 325 MG tablet Take 650 mg by mouth 2 (two) times daily as needed (pain).     aspirin EC 81 MG tablet Take 81 mg by mouth daily.     atorvastatin (LIPITOR) 80 MG tablet Take 1 tablet (80 mg total) by mouth daily. 30 tablet 0    clopidogrel (PLAVIX) 75 MG tablet Take 1 tablet (75 mg total) by mouth daily. 30 tablet 0   empagliflozin (JARDIANCE) 10 MG TABS tablet Take 10 mg by mouth daily.     insulin glargine (LANTUS) 100 UNIT/ML Solostar Pen Inject 15 Units into the skin at bedtime. 15 mL 0   insulin lispro (ADMELOG) 100 UNIT/ML injection Inject 3 Units into the skin See admin instructions. Inject 3 units twice daily before lunch and supper.     Magnesium Hydroxide (MILK OF MAGNESIA PO) Take 30 mLs by mouth See admin instructions. Give 30 mL every morning. Repeat dose if no BM prior to lunch. Repeat dose once more if no BM before supper.     Melatonin 10 MG TABS Take 10 mg by mouth at bedtime.     metoprolol succinate (TOPROL-XL) 25 MG 24 hr tablet Take 0.5 tablets (12.5 mg total) by mouth daily. 30 tablet 0   nitroGLYCERIN (NITROSTAT) 0.4 MG SL tablet DISSOLVE 1 TABLET UNDER THE TONGUE EVERY 5 MINUTES AS NEEDED FOR CHEST PAIN 25 tablet 11   pantoprazole (PROTONIX) 40 MG tablet Take 40 mg by mouth 2 (two) times daily.     tamsulosin (FLOMAX) 0.4 MG CAPS capsule Take 1 capsule (0.4 mg total) by mouth daily after supper. (Patient taking differently: Take 0.4 mg by mouth 2 (two) times daily.) 30 capsule 0   traMADol (ULTRAM) 50 MG tablet Take 50 mg by mouth 2 (two) times daily as needed for moderate pain.     Wheat Dextrin (BENEFIBER PO) Take 1 Scoop by mouth See admin instructions. Give 1 Tablespoon mixed into drink every morning after breakfast.     albuterol (VENTOLIN HFA) 108 (90 Base) MCG/ACT inhaler Inhale 2 puffs into the lungs every 6 (six) hours as needed for wheezing or shortness of breath. (Patient not taking: Reported on 04/10/2023)     irbesartan (AVAPRO) 150 MG tablet Take 1 tablet (150 mg total) by mouth daily. 30 tablet 0   No facility-administered medications prior to visit.    PAST MEDICAL HISTORY: Past Medical History:  Diagnosis Date   CAD (coronary artery disease)    a. 04/2007 s/p CABG x 4  -  LIMA->LAD, VG->OM1->Om2, VG->PDA;  b. 12/2008 & 06/2010 Caths - Native multivessel dzs with 4/4 patent grafts. c. cath 03/09/2015 patent grafts, unchanged from prior cath.   Cancer [facility])    pancreatic  March 2011   Carotid bruit    a. 07/2010 U/S- 0-39% bilat ICA stenosis   Chest pain    Noncardiac probably related to reflux   DDD (degenerative disc disease)    several surgeries   Fracture acetabulum-closed (HCC) 07/08/2018   GERD (gastroesophageal reflux disease)    HNP (herniated nucleus pulposus), lumbar    a. L2-3, s/p laminotomy, microdiskectomy 02/2009   HOH (hard of  hearing)    uses amplifiers    Nephrolithiasis    PAD (peripheral artery disease) (HCC) 04/2014   s/p L Common Iliac Stent   Palpitations    Pancreatic mass    a. 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   Pneumonia    hx of  pna   Pure hypercholesterolemia    statin intolerance   Type II or unspecified type diabetes mellitus without mention of complication, not stated as uncontrolled    insulin dependent   Unspecified essential hypertension     PAST SURGICAL HISTORY: Past Surgical History:  Procedure Laterality Date   ABDOMINAL AORTAGRAM N/A 05/13/2014   Procedure: ABDOMINAL Ronny Flurry;  Surgeon: Iran Ouch, MD;  Location: [facility] CATH LAB;  Service: Cardiovascular;  Laterality: N/A;   ANTERIOR CERVICAL DECOMP/DISCECTOMY FUSION N/A 07/28/2014   Procedure: ANTERIOR CERVICAL DECOMPRESSION/DISCECTOMY FUSION CERVICAL FOUR-FIVE CERVICAL FIVE-SIX ;  Surgeon: Julio Sicks, MD;  Location: [facility] NEURO [facility];  Service: Neurosurgery;  Laterality: N/A;   APPENDECTOMY     arthroscopy right knee     BACK SURGERY     CARDIAC CATHETERIZATION N/A 03/09/2015   Procedure: Left Heart Cath and Cors/Grafts Angiography;  Surgeon: Lennette Bihari, MD;  Location: [facility] INVASIVE CV LAB;  Service: Cardiovascular;  Laterality: N/A;   CHOLECYSTECTOMY     CORONARY ARTERY BYPASS GRAFT     X 4 2009 (LIMA to LAD, SVG to first cicumflex  marginal branch with sequential SVG to second cicumflex marginal branch  and saphenous vein to posterior descending coronary artery with endoscopic,vein harvest rt. lower exttremity by Dr.Owen on March 31,2009   CORONARY/GRAFT ACUTE MI REVASCULARIZATION N/A 11/06/2020   Procedure: Coronary/Graft Acute MI Revascularization;  Surgeon: Swaziland, Peter M, MD;  Location: [facility] INVASIVE CV LAB;  Service: Cardiovascular;  Laterality: N/A;   CYST REMOVED     FROM SPINE   CYSTOURETHROSCOPY     right retrograde pyelogram,manipulate stone in the renal pelvis, rt. double-j catheter.   EYE SURGERY     KNEE SURGERY     rt knee   LEFT HEART CATH AND CORONARY ANGIOGRAPHY N/A 11/06/2020   Procedure: LEFT HEART CATH AND CORONARY ANGIOGRAPHY;  Surgeon: Swaziland, Peter M, MD;  Location: [facility] INVASIVE CV LAB;  Service: Cardiovascular;  Laterality: N/A;   LEFT HEART CATH AND CORS/GRAFTS ANGIOGRAPHY N/A 02/20/2017   Procedure: LEFT HEART CATH AND CORS/GRAFTS ANGIOGRAPHY;  Surgeon: Marykay Lex, MD;  Location: [facility] INVASIVE CV LAB;  Service: Cardiovascular;  Laterality: N/A;   LEFT HEART CATH AND CORS/GRAFTS ANGIOGRAPHY N/A 05/28/2018   Procedure: LEFT HEART CATH AND CORS/GRAFTS ANGIOGRAPHY;  Surgeon: Kathleene Hazel, MD;  Location: [facility] INVASIVE CV LAB;  Service: Cardiovascular;  Laterality: N/A;   NEPHROLITHOTOMY     PERCUTANEOUS   PARS PLANA VITRECTOMY     lt. eye,retinal photocoagulation lt. eye, membrane peel lt. eye.   PERCUTANEOUS STENT INTERVENTION Left 05/13/2014   Procedure: PERCUTANEOUS STENT INTERVENTION;  Surgeon: Iran Ouch, MD;  Location: [facility] CATH LAB;  Service: Cardiovascular;  Laterality: Left;  COMMON ILLIAC   re-exploratin of laminectomy  05/2008   (RT L2-3) WITH REDO MICRODISKECTOMY    FAMILY HISTORY: Family History  Problem Relation Age of Onset   Other Mother        died @ 26   Heart attack Father        died of MI @ 1   Coronary artery disease Sister        alive   Stroke  Sister     Diabetes Brother        type 2   Coronary artery disease Brother        s/p CABG.  Alive @ 81   Stroke Maternal Aunt    Other Other        no fh of colon cancer    SOCIAL HISTORY: Social History   Socioeconomic History   Marital status: Married    Spouse name: Not on file   Number of children: Not on file   Years of education: Not on file   Highest education level: Not on file  Occupational History   Not on file  Tobacco Use   Smoking status: Never   Smokeless tobacco: Never  Vaping Use   Vaping status: Never Used  Substance and Sexual Activity   Alcohol use: Not on file   Drug use: No   Sexual activity: Not Currently  Other Topics Concern   Not on file  Social History Narrative   Lives in [place] with wife.  Retired Optician, dispensing.  Walks 1.30mi daily.  Does not work 2/2 back problems.   Social Drivers of Corporate investment banker Strain: Not on file  Food Insecurity: No Food Insecurity (01/06/2023)   Hunger Vital Sign    Worried About Running Out of Food in the Last Year: Never true    Ran Out of Food in the Last Year: Never true  Transportation Needs: No Transportation Needs (01/06/2023)   PRAPARE - Administrator, Civil Service (Medical): No    Lack of Transportation (Non-Medical): No  Physical Activity: Not on file  Stress: Not on file  Social Connections: Not on file  Intimate Partner Violence: Not At Risk (01/06/2023)   Humiliation, Afraid, Rape, and Kick questionnaire    Fear of Current or Ex-Partner: No    Emotionally Abused: No    Physically Abused: No    Sexually Abused: No    PHYSICAL EXAM  GENERAL EXAM/CONSTITUTIONAL: Vitals:  Vitals:   04/10/23 0824  BP: (!) 133/59  Pulse: 62  Weight: 150 lb (68 kg)  Height: 5\' 5"  (1.651 m)   Body mass index is 24.96 kg/m. Wt Readings from Last 3 Encounters:  04/10/23 150 lb (68 kg)  03/16/23 147 lb (66.7 kg)  01/11/23 154 lb 5.2 oz (70 kg)   Patient is in no distress; well developed,  nourished and groomed; neck is supple  MUSCULOSKELETAL: Gait, strength, tone, movements noted in Neurologic exam below  NEUROLOGIC: MENTAL STATUS:     04/10/2023    8:33 AM  MMSE - Mini Mental State Exam  Orientation to time 0  Orientation to Place 4  Registration 3  Attention/ Calculation 1  Recall 2  Language- name 2 objects 2  Language- repeat 0  Language- follow 3 step command 3  Language- read & follow direction 1  Write a sentence 1  Copy design 0  Total score 17    CRANIAL NERVE:  2nd, 3rd, 4th, 6th- visual fields full to confrontation, extraocular muscles intact, no nystagmus 5th - facial sensation symmetric 7th - facial strength symmetric 8th - hearing intact 9th - palate elevates symmetrically, uvula midline 11th - shoulder shrug symmetric 12th - tongue protrusion midline  MOTOR:  normal bulk and tone, full strength in the BUE, BLE  SENSORY:  normal and symmetric to light touch  COORDINATION:  finger-nose-finger, fine finger movements normal  GAIT/STATION:  Deferred in a wheelchair but can stand unassisted.  DIAGNOSTIC DATA (LABS, IMAGING, TESTING) - I reviewed patient records, labs, notes, testing and imaging myself where available.  Lab Results  Component Value Date   WBC 10.6 (H) 01/11/2023   HGB 14.8 01/11/2023   HCT 44.9 01/11/2023   MCV 90.2 01/11/2023   PLT 242 01/11/2023      Component Value Date/Time   NA 136 01/11/2023 0719   NA 140 11/25/2020 1536   K 4.1 01/11/2023 0719   CL 102 01/11/2023 0719   CO2 24 01/11/2023 0719   GLUCOSE 156 (H) 01/11/2023 0719   BUN 20 01/11/2023 0719   BUN 20 11/25/2020 1536   CREATININE 1.09 01/11/2023 0719   CALCIUM 9.2 01/11/2023 0719   PROT 8.4 (H) 01/06/2023 0840   PROT 7.6 09/08/2019 1506   ALBUMIN 4.1 01/06/2023 0840   ALBUMIN 4.5 09/08/2019 1506   AST 24 01/06/2023 0840   ALT 27 01/06/2023 0840   ALKPHOS 66 01/06/2023 0840   BILITOT 1.1 01/06/2023 0840   BILITOT 0.3 09/08/2019 1506    GFRNONAA >60 01/11/2023 0719   GFRAA 96 09/08/2019 1506   Lab Results  Component Value Date   CHOL 146 01/06/2023   HDL 46 01/06/2023   LDLCALC 82 01/06/2023   TRIG 92 01/06/2023   CHOLHDL 3.2 01/06/2023   Lab Results  Component Value Date   HGBA1C 8.1 (H) 01/06/2023   Lab Results  Component Value Date   VITAMINB12 999 (H) 07/26/2018   Lab Results  Component Value Date   TSH 0.706 01/07/2023    MRI Brain 01/06/2023 1. Acute subcentimeter infarction of the right lateral thalamus. 2. Chronic small-vessel ischemic changes of the pons and cerebral hemispheric white matter. Old right parietal vertex cortical and subcortical infarction.    ASSESSMENT AND PLAN  83 y.o. year old male with hypertension, hyperlipidemia, diabetes, heart disease, lung disease who is presenting with family with complaint of memory loss described as forgetfulness, confusion, not remembering names, sometimes mistaking wife and daughter and sometimes calling wife by his mother name.  On exam he scored a 17 out of 30 on the MMSE indicative of impairment.  Patient does need some help with activities of daily living, with including medication, bathing, and needing constant reminders.  I have informed patient and family that I do believe that he does have dementia, likely Alzheimer's disease.  His most recent TSH and B12 were within normal limits.  I will obtain a ATN profile to look for Alzheimer disease biomarker to confirm the diagnosis, and if negative, would consider vascular dementia as patient has chronic small vessel disease in his most recent MRI brain.  We will start him on Exelon 1.5 mg twice daily to minimize side effect as wife tells me that he does have vivid dream.  Will also request home services for home health aid and physical therapy as he has been falling and not using his walker.  I will see him in 1 year for follow-up or sooner if worse.   1. Mild late onset Alzheimer's dementia without  behavioral disturbance, psychotic disturbance, mood disturbance, or anxiety (HCC)   2. Bilateral hearing loss, unspecified hearing loss type   3. Gait abnormality   4. Fall, sequela      Patient Instructions  Will obtain ATN profile to confirm the presence of Alzheimer disease biomarker Start Exelon 1.5 mg twice daily, side effects of the medication include dizziness, vivid dreams, diarrhea Referral for to home services for home health aide and physical therapy Continue  your other medications Continue to follow-up with Doctors Return in 1 year or sooner if worse.  Orders Placed This Encounter  Procedures   ATN PROFILE   Ambulatory referral to Home Health    Meds ordered this encounter  Medications   rivastigmine (EXELON) 1.5 MG capsule    Sig: Take 1 capsule (1.5 mg total) by mouth 2 (two) times daily.    Dispense:  60 capsule    Refill:  11    Return in about 1 year (around 04/09/2024).  I have spent a total of 65 minutes dedicated to this patient today, preparing to see patient, performing a medically appropriate examination and evaluation, ordering tests and/or medications and procedures, and counseling and educating the patient/family/caregiver; independently interpreting result and communicating results to the family/patient/caregiver; and documenting clinical information in the electronic medical record.   Windell Norfolk, MD 04/10/2023, 5:52 PM  [facility] [address] [place], [place] [address] [phone]

## 2023-04-11 ENCOUNTER — Telehealth: Payer: Self-pay | Admitting: Neurology

## 2023-04-11 NOTE — Telephone Encounter (Signed)
 [facility] is going to take this patient starting next week per patient's wife.

## 2023-04-13 LAB — ATN PROFILE
A -- Beta-amyloid 42/40 Ratio: 0.107 (ref >0.102)
Beta-amyloid 40: 201.43 pg/mL
Beta-amyloid 42: 21.55 pg/mL
N -- NfL, Plasma: 6.08 pg/mL (ref 0.00–11.55)
T -- p-tau181: 1.41 pg/mL — ABNORMAL HIGH (ref 0.00–0.97)

## 2023-04-13 NOTE — Progress Notes (Signed)
 Please call and advise the patient that the recent labs we checked did not show evidence of Alzheimer disease biomarker. Her dementia is likely Vascular dementia.  Continue current medication and return as scheduled.  Please remind patient to keep any upcoming appointments or tests and to call us with any interim questions, concerns, problems or updates. Thanks,   Windell Norfolk, MD

## 2023-04-16 ENCOUNTER — Telehealth: Payer: Self-pay

## 2023-04-16 NOTE — Telephone Encounter (Signed)
 Spoke with patients wife over the phone. She is asking if Dr Teresa Coombs sent in the rx for medication for the patients dementia. She did not know the name of this medication but stated that it was discussed at the visit last week.  Please call patents wife back with an update.   Thanks!

## 2023-04-16 NOTE — Telephone Encounter (Signed)
 Called pt's wife back and she stated that pt has been sleeping a lot. Pt has been sleeping all day today. Pt's wife states that pt doesn't have a problem with eating, or sleeping. Pt is doing less activities than he used to do. Pt's wife wants to know what stage of Vascular Dementia her husband is in. Pt's wife states that she has gotten pt's Dementia medication Rivastigmine (Exelon) 1.5mg  capsules.

## 2023-04-16 NOTE — Telephone Encounter (Signed)
 LVM for pt to call back to get his lab results

## 2023-04-16 NOTE — Telephone Encounter (Signed)
 At this stage, the severity is mild.

## 2023-04-16 NOTE — Telephone Encounter (Addendum)
 Pt's wife called and relayed pt's lab results per Dr. Teresa Coombs.  Will call wife with more details.  ----- Message from Windell Norfolk sent at 04/13/2023  8:25 AM EST ----- Please call and advise the patient that the recent labs we checked did not show evidence of Alzheimer disease biomarker. Her dementia is likely Vascular dementia.  Continue current medication and return as scheduled.  Please remind patient to keep any upcoming appointments or tests and to call us with any interim questions, concerns, problems or updates. Thanks,   Windell Norfolk, MD

## 2023-04-17 DIAGNOSIS — R300 Dysuria: Secondary | ICD-10-CM | POA: Diagnosis not present

## 2023-04-17 NOTE — Telephone Encounter (Signed)
 Called wife back to let her know that pt's Vascular Dementia per Dr. Teresa Coombs was Mild.

## 2023-05-02 ENCOUNTER — Other Ambulatory Visit: Payer: Self-pay | Admitting: Neurology

## 2023-05-02 ENCOUNTER — Telehealth: Payer: Self-pay | Admitting: Neurology

## 2023-05-02 DIAGNOSIS — R300 Dysuria: Secondary | ICD-10-CM | POA: Diagnosis not present

## 2023-05-02 DIAGNOSIS — D519 Vitamin B12 deficiency anemia, unspecified: Secondary | ICD-10-CM | POA: Diagnosis not present

## 2023-05-02 DIAGNOSIS — Z6821 Body mass index (BMI) 21.0-21.9, adult: Secondary | ICD-10-CM | POA: Diagnosis not present

## 2023-05-02 MED ORDER — QUETIAPINE FUMARATE 25 MG PO TABS: 25.0000 mg | ORAL_TABLET | Freq: Every day | ORAL | 0 refills | Status: DC

## 2023-05-02 NOTE — Telephone Encounter (Signed)
 Called pt's wife and she stated that last night was really bad, due to pt's husband trying to have sex with her. Pt's wife states that they haven't had sex for a long time and is concerned about pt's advances due to their age. Pt's wife states the pt asked her she was seeing someone else and cheating on him. Pt's wife stated that she told pt that "Shiro absolutely not, I love you". Pt then stated he could leave b/c he shouldn't have to live like this and be with someone that don't want him or want to be with him. Pt's wife states that pt thinks that there are 2 of her, a young version and an older version of her, and he looks for the younger version throughout the house. Pt's wife states that if pt can get something for sleep at night it would help them both rest.   Sending to MD.

## 2023-05-02 NOTE — Telephone Encounter (Signed)
 Called pt's wife back, and pt's daughter anwsered the phone stating that pt's wife was in the shower, told daughter per Dr. Teresa Coombs that Seroquel 25mg  nightly has been sent to pt's pharmacy. Pt's daughter states that she will let pt's wife know and they will pick up pt's medication.

## 2023-05-02 NOTE — Telephone Encounter (Signed)
 Wife states hallucinations are worsening, especially at night.  Wife is asking if something can be called in for pt to take at night.

## 2023-05-02 NOTE — Telephone Encounter (Signed)
 Please inform patient that Seroquel 25 mg nightly has been sent to the pharmacy in [place].

## 2023-05-03 MED ORDER — QUETIAPINE FUMARATE 25 MG PO TABS: 25.0000 mg | ORAL_TABLET | Freq: Every day | ORAL | 0 refills | Status: DC

## 2023-05-03 NOTE — Telephone Encounter (Signed)
 Patients wife called in - stated that she called her pharmacy and that the medication prescribed by Dr Teresa Coombs had not been sent in. She stated that it must have been an oversight on our part that the medication had not been sent in yet. I advised her that the rx for  QUEtiapine (SEROQUEL) 25 MG tablet had been sent to the [facility] pharmacy in [place] yesterday and it was confirmed by the pharmacy. She advised that that is the wrong pharmacy and they no longer use it. She stated that our office was advised of this the last time the patient was seen and that they are now using [id number] - [place], [place] - [address]  She expressed frustration that this had not been changed by our office.   She is asking if the prescription can be resent to the correct pharmacy today.   She also complained about their not being an option to speak with a nurse when calling into our office. I did advise that if she stays on the line, the call gets transferred to the phone room who can get her in contact with a nurse.

## 2023-05-03 NOTE — Addendum Note (Signed)
 Addended by: Danne Harbor on: 05/03/2023 10:53 AM   Modules accepted: Orders

## 2023-05-03 NOTE — Addendum Note (Signed)
 Addended by: Lenn Cal on: 05/03/2023 10:58 AM   Modules accepted: Orders

## 2023-05-03 NOTE — Telephone Encounter (Signed)
 Call to [facility] in [place] and spoke with shelby, discontinued script there and [facility] [place].  Call to wife to make aware

## 2023-05-08 DIAGNOSIS — E1165 Type 2 diabetes mellitus with hyperglycemia: Secondary | ICD-10-CM | POA: Diagnosis not present

## 2023-05-08 DIAGNOSIS — Z6822 Body mass index (BMI) 22.0-22.9, adult: Secondary | ICD-10-CM | POA: Diagnosis not present

## 2023-05-08 DIAGNOSIS — F0150 Vascular dementia without behavioral disturbance: Secondary | ICD-10-CM | POA: Diagnosis not present

## 2023-05-16 DIAGNOSIS — E119 Type 2 diabetes mellitus without complications: Secondary | ICD-10-CM | POA: Diagnosis not present

## 2023-05-16 DIAGNOSIS — R109 Unspecified abdominal pain: Secondary | ICD-10-CM | POA: Diagnosis not present

## 2023-05-16 DIAGNOSIS — R300 Dysuria: Secondary | ICD-10-CM | POA: Diagnosis not present

## 2023-05-16 DIAGNOSIS — M545 Low back pain, unspecified: Secondary | ICD-10-CM | POA: Diagnosis not present

## 2023-05-16 DIAGNOSIS — N390 Urinary tract infection, site not specified: Secondary | ICD-10-CM | POA: Diagnosis not present

## 2023-05-25 ENCOUNTER — Other Ambulatory Visit: Payer: Self-pay | Admitting: Neurology

## 2023-05-28 NOTE — Telephone Encounter (Signed)
 Requested Prescriptions   Pending Prescriptions Disp Refills   QUEtiapine (SEROQUEL) 25 MG tablet [Pharmacy Med Name: QUETIAPINE FUMARATE 25 MG TAB] 90 tablet 1    Sig: TAKE 1 TABLET BY MOUTH EVERYDAY AT BEDTIME   Last seen 04/10/23, next appt 04/09/24  Dispenses   Dispensed Days Supply Quantity Provider Pharmacy  QUETIAPINE FUMARATE 25 MG TAB 05/03/2023 30 30 each Camara, Amadou, MD [phone] [address]

## 2023-05-30 ENCOUNTER — Other Ambulatory Visit: Payer: Self-pay | Admitting: Cardiovascular Disease

## 2023-06-04 DIAGNOSIS — B379 Candidiasis, unspecified: Secondary | ICD-10-CM | POA: Diagnosis not present

## 2023-07-23 DIAGNOSIS — D519 Vitamin B12 deficiency anemia, unspecified: Secondary | ICD-10-CM | POA: Diagnosis not present

## 2023-07-23 DIAGNOSIS — G4700 Insomnia, unspecified: Secondary | ICD-10-CM | POA: Diagnosis not present

## 2023-10-25 DIAGNOSIS — E1165 Type 2 diabetes mellitus with hyperglycemia: Secondary | ICD-10-CM | POA: Diagnosis not present

## 2023-10-25 DIAGNOSIS — G4700 Insomnia, unspecified: Secondary | ICD-10-CM | POA: Diagnosis not present

## 2023-10-25 DIAGNOSIS — I10 Essential (primary) hypertension: Secondary | ICD-10-CM | POA: Diagnosis not present

## 2023-10-25 DIAGNOSIS — Z6824 Body mass index (BMI) 24.0-24.9, adult: Secondary | ICD-10-CM | POA: Diagnosis not present

## 2023-10-30 ENCOUNTER — Telehealth: Payer: Self-pay | Admitting: Cardiovascular Disease

## 2023-10-30 MED ORDER — IRBESARTAN 300 MG PO TABS: 300.0000 mg | ORAL_TABLET | Freq: Every day | ORAL | 3 refills | Status: AC

## 2023-10-30 NOTE — Telephone Encounter (Signed)
 Pt c/o medication issue:  1. Name of Medication:   metoprolol  succinate (TOPROL -XL) 25 MG 24 hr tablet   2. How are you currently taking this medication (dosage and times per day)?   3. Are you having a reaction (difficulty breathing--STAT)?   4. What is your medication issue?   Caller [employer]) stated patient has been taking 1 whole tablet for the past 2 months and wants to know if patient's prescription can be increased.  Caller stated can also call wife Lysbeth) at [phone].

## 2023-10-30 NOTE — Telephone Encounter (Signed)
 Called and made patient aware per Dr. Delford to increase Irbesartan  300 mg daily and follow up with primary for Hypertension.Made spouse aware to continue toprol  XL 12.5mg  daily. Understanding verbalized.

## 2023-10-30 NOTE — Telephone Encounter (Signed)
 Patient's wife and daughter, Zada calling and would like advice and recommendations to increase Toprol  XL 25 mg instead of  the ordered dose per provider Toprol  XL 2.5 mg.  Patient has been taking Toprol  XL 25 mg for couple months [er daughter. Requesting advice on continuing Toprol  XL 25 mg. Patient  denies lightheadedness, dizziness or headache.Per Patient's daughter blood pressure  readings today 169/80, 65, 166/83, 60. Made patient and daughter aware this will be forward to provider for advice. Understanding verbalized

## 2023-12-20 ENCOUNTER — Telehealth: Payer: Self-pay | Admitting: Cardiovascular Disease

## 2023-12-20 NOTE — Telephone Encounter (Signed)
 Pt spouse is calling due to appt with Dr Delford I offered the first appt that was available and offered to place pt on wait list as well pt spouse would like a c/b from nurse Pt spouse also had some questions about medication please advise   irbesartan  (AVAPRO ) 300 MG tablet

## 2023-12-20 NOTE — Telephone Encounter (Signed)
 Pt c/o medication issue:  1. Name of Medication:   irbesartan  (AVAPRO ) 300 MG tablet    2. How are you currently taking this medication (dosage and times per day)? As written  3. Are you having a reaction (difficulty breathing--STAT)? no  4. What is your medication issue? She is questioning if he is still on this medications because they have been checking his heart rate for about an hour and it is staying between 48-50.  STAT if HR is under 50 or over 120 (normal HR is 60-100 beats per minute)  What is your heart rate? 48-50  Do you have a log of your heart rate readings (document readings)? no  Do you have any other symptoms? no

## 2023-12-20 NOTE — Telephone Encounter (Signed)
 Spoke with patient's wife Dickey, she does not want to wait for patient to see Dr. Nishan until next opening in January.  Offered sooner appt with APP, Dickey was hesitant and thought about it and decided to go ahead and take appt with APP on 12/26/23 at 2:45 PM.

## 2023-12-20 NOTE — Telephone Encounter (Signed)
 Spoke with Nat who states that this is the first time that she has been out to see the patient. She states that his heart rate stayed around 50 during the entire visit. She was wondering whether the patient should continue his irbesartan . Asked about blood pressure and she states that his BP was 120/62. Advised that he should continue his irbesartan . She states he is not having any dizziness or lightheadedness. Advised he should continue meds as prescribed. She states that wife should be calling us  soon to schedule a follow up visit.

## 2023-12-26 ENCOUNTER — Ambulatory Visit: Attending: Physician Assistant | Admitting: Physician Assistant

## 2023-12-26 ENCOUNTER — Encounter: Payer: Self-pay | Admitting: Physician Assistant

## 2023-12-26 VITALS — BP 110/58 | HR 57 | Ht 65.0 in | Wt 152.4 lb

## 2023-12-26 DIAGNOSIS — R001 Bradycardia, unspecified: Secondary | ICD-10-CM

## 2023-12-26 DIAGNOSIS — E782 Mixed hyperlipidemia: Secondary | ICD-10-CM | POA: Diagnosis not present

## 2023-12-26 DIAGNOSIS — I5020 Unspecified systolic (congestive) heart failure: Secondary | ICD-10-CM

## 2023-12-26 DIAGNOSIS — I10 Essential (primary) hypertension: Secondary | ICD-10-CM

## 2023-12-26 DIAGNOSIS — I739 Peripheral vascular disease, unspecified: Secondary | ICD-10-CM

## 2023-12-26 DIAGNOSIS — I2510 Atherosclerotic heart disease of native coronary artery without angina pectoris: Secondary | ICD-10-CM | POA: Diagnosis not present

## 2023-12-26 NOTE — Assessment & Plan Note (Addendum)
 Hx of L CIA stent and know bilat SFA occlusion.

## 2023-12-26 NOTE — Assessment & Plan Note (Addendum)
 Hx of CABG in 2009, post STEMI in 2022 tx w DES to S-OM1/OM2. L-LAD and S-PDA were patent at that time. He has been continued on DAPT. No current symptoms suggestive of ischemia. - Continue aspirin  81 mg daily. - Continue Lipitor  80 mg daily. - Continue Plavix  75 mg daily. - Continue nitroglycerin  as needed.

## 2023-12-26 NOTE — Assessment & Plan Note (Signed)
 Continue Lipitor  80 mg daily

## 2023-12-26 NOTE — Progress Notes (Signed)
 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 Cage  Carotid artery disease US  04/07/21: Bilat ICA 1-39 (clinical follow up) Diabetes mellitus type 2 Hypertension  Hyperlipidemia  Hx of pulmonary embolism, DVT Chronic mesenteric ischemia  Hx of CVA 12/2022 Pancreatic neuroendocrine tumor GERD DDD  BPH Dementia   DNR (Do Not Resuscitate)        Discussed the use of AI scribe software for clinical note transcription with the patient, who gave verbal consent to proceed. History of Present Illness Ricardo Le is a 83 y.o. male for evaluation of bradycardia, shortness of breath. Last seen by Dr. Nishan in 02/2023 w plans for 1 year f/u. Home Health called recently with low HRs. His spouse called and requested earlier appt.   He is accompanied by his son. He experiences episodes of low heart rate, with a recent episode where his heart rate dropped to the low forties. No dizziness, syncope, or chest pain. He experiences occasional shortness of breath,  particularly at night, but denies orthopnea. He uses a cane and walker for mobility and has fallen a few times without injury.    ROS-See HPI     Studies Reviewed:  EKG Interpretation Date/Time:  Wednesday December 26 2023 14:29:34 EST Ventricular Rate:  57 PR Interval:  190 QRS Duration:  144 QT Interval:  462 QTC Calculation: 449 R Axis:   147  Text Interpretation: Sinus bradycardia with occasional Premature ventricular complexes Right bundle branch block Septal infarct , age undetermined T wave abnormality, consider inferior ischemia No significant change since last tracing Right bundle branch block has been present on some tracings in the past Confirmed by Ricardo Le [phone]) on 12/26/2023 2:34:34 PM    Labs  01/06/23: TC 146, Trig 92, HDL 46, LDL 82, SCr 1.09, K 4.1, Hgb 14.8 Results LABS Total Cholesterol: 133 (12/23/2023) HDL: 52 (12/23/2023) LDL: 82 (12/23/2023) Triglycerides: 133 (12/23/2023) Creatinine: 1.0 (12/23/2023) ALT: 26 (12/23/2023) TSH: 0.706 (01/07/2023)          Physical Exam:  VS:  BP (!) 110/58   Pulse (!) 57   Ht 5' 5 (1.651 m)   Wt 152 lb 6.4 oz (69.1 kg)   SpO2 97%   BMI 25.36 kg/m        Wt Readings from Last 3 Encounters:  12/26/23 152 lb 6.4 oz (69.1 kg)  04/10/23 150 lb (68 kg)  03/16/23 147 lb (66.7 kg)    Constitutional:      Appearance: Not in distress. Chronically  ill-appearing.  Neck:     Vascular: No JVR.  Pulmonary:     Breath sounds: No wheezing. No rales.  Cardiovascular:     Bradycardia present. Regular rhythm.     Murmurs: There is a grade 2/6 systolic murmur at the LLSB.  Edema:    Peripheral edema absent.  Abdominal:     Palpations: Abdomen is soft.       Assessment and Plan:    Assessment & Plan Sinus bradycardia His heart rate today is in the fifties.  He seems to be asymptomatic.  He does have evidence of conduction system disease with right bundle branch block on EKG. he has not had syncope or  near-syncope.  We discussed proceeding with an echocardiogram and Zio XT monitor to further assess his bradycardia.  His son would like to discuss with his mother before proceeding.  He will let us  know if they decide to pursue this.  If his heart rate is documented to be slower or he has symptoms consistent with bradycardia, we can discontinue his metoprolol . Coronary artery disease involving native coronary artery of native heart without angina pectoris Hx of CABG in 2009, post STEMI in 2022 tx w DES to S-OM1/OM2. L-LAD and S-PDA were patent at that time. He has been continued on DAPT. No current symptoms suggestive of ischemia. - Continue aspirin  81 mg daily. - Continue Lipitor  80 mg daily. - Continue Plavix  75 mg daily. - Continue nitroglycerin  as needed. Heart failure with mildly reduced ejection fraction (HFmrEF, 41-49%) (HCC) EF improved post MI in 2022 to normal. However, his EF was 40-45 on TTE after CVA in 2024. He notes occasional shortness of breath.  However, he does not appear to be volume overloaded on exam.  He is deconditioned due to advanced dementia, CHF, CAD and prior stroke.  His son notes he is a DNR.  I did offer proceeding with an echocardiogram to reassess LV function as well as labs including CBC, BMET BNP.  As noted, his son will discuss further with his mother to determine if he wants to pursue further testing at this time. Essential hypertension Blood pressure controlled. - Continue irbesartan  300 mg daily. - Continue metoprolol  succinate 12.5 mg daily. - If blood pressure runs lower, will consider decreasing irbesartan  to 150 mg daily or discontinuing metoprolol . Mixed hyperlipidemia Continue Lipitor  80 mg daily. PAD (peripheral artery disease) (HCC) Hx of L CIA stent and know bilat SFA occlusion.         Dispo:  Return in about 6 months (around 06/24/2024) for Routine Follow Up, w/ Dr. Delford.  Signed, Ricardo Ferrier, PA-C

## 2023-12-26 NOTE — Patient Instructions (Signed)
 Medication Instructions:  No changes *If you need a refill on your cardiac medications before your next appointment, please call your pharmacy*  Lab Work: We can check labs to make sure you are not anemic or showing signs of heart failure. I could not detect any fluid but the blood test helps us  make sure and if it is elevated, we can adjust medications. The labs we would check are: CBC, BMET, NT Pro BNP  If you have labs (blood work) drawn today and your tests are completely normal, you will receive your results only by: MyChart Message (if you have MyChart) OR A paper copy in the mail If you have any lab test that is abnormal or we need to change your treatment, we will call you to review the results.  Testing/Procedures: We can do an Echocardiogram or ultrasound of the heart to see if the ejection fraction (heart function) has changed any. If so, we can change your medications some. We can do a heart monitor for 2 weeks that will look for abnormally slow rhythms. If there are any, we would change your medications to help with that.  Follow-Up: At [facility], you and your health needs are our priority.  As part of our continuing mission to provide you with exceptional heart care, our providers are all part of one team.  This team includes your primary Cardiologist (physician) and Advanced Practice Providers or APPs (Physician Assistants and Nurse Practitioners) who all work together to provide you with the care you need, when you need it.  Your next appointment:   6 month(s)  Provider:   Maude Emmer, MD    We recommend signing up for the patient portal called MyChart.  Sign up information is provided on this After Visit Summary.  MyChart is used to connect with patients for Virtual Visits (Telemedicine).  Patients are able to view lab/test results, encounter notes, upcoming appointments, etc.  Non-urgent messages can be sent to your provider as well.   To learn more about  what you can do with MyChart, go to [email or web address].   Other Instructions If you want to get the blood work, echocardiogram or heart monitor, let us  know.

## 2023-12-26 NOTE — Assessment & Plan Note (Signed)
 Blood pressure controlled. - Continue irbesartan  300 mg daily. - Continue metoprolol  succinate 12.5 mg daily. - If blood pressure runs lower, will consider decreasing irbesartan  to 150 mg daily or discontinuing metoprolol .

## 2023-12-26 NOTE — Assessment & Plan Note (Addendum)
 EF improved post MI in 2022 to normal. However, his EF was 40-45 on TTE after CVA in 2024. He notes occasional shortness of breath.  However, he does not appear to be volume overloaded on exam.  He is deconditioned due to advanced dementia, CHF, CAD and prior stroke.  His son notes he is a DNR.  I did offer proceeding with an echocardiogram to reassess LV function as well as labs including CBC, BMET BNP.  As noted, his son will discuss further with his mother to determine if he wants to pursue further testing at this time.

## 2024-01-03 DIAGNOSIS — E1165 Type 2 diabetes mellitus with hyperglycemia: Secondary | ICD-10-CM | POA: Diagnosis not present

## 2024-02-12 ENCOUNTER — Other Ambulatory Visit: Payer: Self-pay

## 2024-02-13 MED ORDER — CLOPIDOGREL BISULFATE 75 MG PO TABS: 75.0000 mg | ORAL_TABLET | Freq: Every day | ORAL | 3 refills | Status: AC

## 2024-04-09 ENCOUNTER — Ambulatory Visit: Payer: Medicare HMO | Admitting: Neurology
